# Patient Record
Sex: Male | Born: 1985 | Race: Black or African American | Hispanic: No | Marital: Single | Smoking: Never smoker
Health system: Southern US, Community
[De-identification: ages and names within clinical notes are randomized; demographics above are authoritative.]

## PROBLEM LIST (undated history)

## (undated) DIAGNOSIS — T80A0XA Non-ABO incompatibility reaction due to transfusion of blood or blood products, unspecified, initial encounter: Secondary | ICD-10-CM

## (undated) DIAGNOSIS — D571 Sickle-cell disease without crisis: Secondary | ICD-10-CM

## (undated) DIAGNOSIS — M87051 Idiopathic aseptic necrosis of right femur: Secondary | ICD-10-CM

## (undated) DIAGNOSIS — Z8709 Personal history of other diseases of the respiratory system: Secondary | ICD-10-CM

## (undated) DIAGNOSIS — Z8639 Personal history of other endocrine, nutritional and metabolic disease: Secondary | ICD-10-CM

## (undated) DIAGNOSIS — Z87898 Personal history of other specified conditions: Secondary | ICD-10-CM

## (undated) DIAGNOSIS — G894 Chronic pain syndrome: Secondary | ICD-10-CM

---
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
## 2008-02-13 HISTORY — PX: OTHER SURGICAL HISTORY: SHX169

## 2008-04-09 HISTORY — PX: TOTAL SHOULDER REPLACEMENT: SUR1217

## 2012-03-13 DIAGNOSIS — M549 Dorsalgia, unspecified: Secondary | ICD-10-CM | POA: Diagnosis present

## 2012-05-31 DIAGNOSIS — G8929 Other chronic pain: Secondary | ICD-10-CM | POA: Insufficient documentation

## 2012-05-31 DIAGNOSIS — M545 Low back pain: Secondary | ICD-10-CM | POA: Insufficient documentation

## 2012-06-17 DIAGNOSIS — R894 Abnormal immunological findings in specimens from other organs, systems and tissues: Secondary | ICD-10-CM | POA: Insufficient documentation

## 2012-07-04 HISTORY — PX: PORTACATH PLACEMENT: SHX2246

## 2012-07-08 DIAGNOSIS — F54 Psychological and behavioral factors associated with disorders or diseases classified elsewhere: Secondary | ICD-10-CM | POA: Insufficient documentation

## 2012-08-16 ENCOUNTER — Inpatient Hospital Stay (HOSPITAL_COMMUNITY)
Admission: EM | Admit: 2012-08-16 | Discharge: 2012-08-23 | DRG: 812 | Disposition: A | Discharge status: Home / Self Care | Payer: Medicare Other | Attending: Internal Medicine | Admitting: Internal Medicine

## 2012-08-16 ENCOUNTER — Encounter (HOSPITAL_COMMUNITY): Payer: Self-pay | Admitting: Neurology

## 2012-08-16 ENCOUNTER — Emergency Department (HOSPITAL_COMMUNITY): Payer: Medicare Other

## 2012-08-16 DIAGNOSIS — D5700 Hb-SS disease with crisis, unspecified: Principal | ICD-10-CM | POA: Diagnosis present

## 2012-08-16 DIAGNOSIS — Z79899 Other long term (current) drug therapy: Secondary | ICD-10-CM

## 2012-08-16 HISTORY — DX: Sickle-cell disease without crisis: D57.1

## 2012-08-16 LAB — CBC
Hemoglobin: 8.5 g/dL — ABNORMAL LOW (ref 13.0–17.0)
MCH: 34.4 pg — ABNORMAL HIGH (ref 26.0–34.0)
MCHC: 34 g/dL (ref 30.0–36.0)
MCV: 101.1 fL — ABNORMAL HIGH (ref 78.0–100.0)
MCV: 101.2 fL — ABNORMAL HIGH (ref 78.0–100.0)
Platelets: 350 10*3/uL (ref 150–400)
RBC: 2.74 MIL/uL — ABNORMAL LOW (ref 4.22–5.81)
RBC: 2.47 MIL/uL — ABNORMAL LOW (ref 4.22–5.81)
RDW: 16.8 % — ABNORMAL HIGH (ref 11.5–15.5)
WBC: 9.4 10*3/uL (ref 4.0–10.5)

## 2012-08-16 LAB — TYPE AND SCREEN
ABO/RH(D): AB POS
DAT, IgG: NEGATIVE

## 2012-08-16 LAB — BASIC METABOLIC PANEL
BUN: 12 mg/dL (ref 6–23)
Calcium: 9.5 mg/dL (ref 8.4–10.5)
Chloride: 105 mEq/L (ref 96–112)
Creatinine, Ser: 0.58 mg/dL (ref 0.50–1.35)
GFR calc Af Amer: >90 mL/min (ref >90)

## 2012-08-16 LAB — RETICULOCYTES
Retic Count, Absolute: 227.4 10*3/uL — ABNORMAL HIGH (ref 19.0–186.0)
Retic Ct Pct: 8.3 % — ABNORMAL HIGH (ref 0.4–3.1)

## 2012-08-16 LAB — SALICYLATE LEVEL: Salicylate Lvl: <2 mg/dL — ABNORMAL LOW (ref 2.8–20.0)

## 2012-08-16 LAB — LACTATE DEHYDROGENASE: LDH: 506 U/L — ABNORMAL HIGH (ref 94–250)

## 2012-08-16 MED ORDER — DIPHENHYDRAMINE HCL 50 MG/ML IJ SOLN
25.0000 mg | Freq: Once | INTRAMUSCULAR | Status: AC
  Administered 2012-08-16: 25 mg via INTRAVENOUS
  Filled 2012-08-16: qty 1

## 2012-08-16 MED ORDER — HYDROXYUREA 500 MG PO CAPS
2000.0000 mg | ORAL_CAPSULE | Freq: Every day | ORAL | Status: DC
  Administered 2012-08-16 – 2012-08-17 (×2): 2000 mg via ORAL
  Filled 2012-08-16 (×2): qty 4

## 2012-08-16 MED ORDER — HYDROMORPHONE HCL PF 1 MG/ML IJ SOLN
1.0000 mg | Freq: Once | INTRAMUSCULAR | Status: AC
  Administered 2012-08-16: 1 mg via INTRAVENOUS
  Filled 2012-08-16: qty 1

## 2012-08-16 MED ORDER — FOLIC ACID 1 MG PO TABS
1.0000 mg | ORAL_TABLET | Freq: Every day | ORAL | Status: DC
  Administered 2012-08-16 – 2012-08-23 (×8): 1 mg via ORAL
  Filled 2012-08-16 (×8): qty 1

## 2012-08-16 MED ORDER — HYDROMORPHONE 0.3 MG/ML IV SOLN
INTRAVENOUS | Status: DC
  Administered 2012-08-16: 0.3 mg via INTRAVENOUS
  Filled 2012-08-16: qty 75

## 2012-08-16 MED ORDER — HYDROMORPHONE HCL PF 2 MG/ML IJ SOLN
2.0000 mg | INTRAMUSCULAR | Status: DC | PRN
  Administered 2012-08-16: 2 mg via INTRAVENOUS
  Filled 2012-08-16: qty 1
  Filled 2012-08-16: qty 2

## 2012-08-16 MED ORDER — HYDROMORPHONE HCL PF 1 MG/ML IJ SOLN
2.0000 mg | INTRAMUSCULAR | Status: DC | PRN
  Administered 2012-08-16 (×3): 2 mg via INTRAVENOUS
  Filled 2012-08-16 (×2): qty 2

## 2012-08-16 MED ORDER — ONDANSETRON HCL 4 MG/2ML IJ SOLN: 4.0000 mg | Freq: Four times a day (QID) | INTRAMUSCULAR | Status: DC | PRN

## 2012-08-16 MED ORDER — ONDANSETRON HCL 4 MG PO TABS: 4.0000 mg | ORAL_TABLET | Freq: Four times a day (QID) | ORAL | Status: DC | PRN

## 2012-08-16 MED ORDER — ONDANSETRON HCL 4 MG/2ML IJ SOLN
4.0000 mg | Freq: Once | INTRAMUSCULAR | Status: AC
  Administered 2012-08-16: 4 mg via INTRAVENOUS
  Filled 2012-08-16: qty 2

## 2012-08-16 MED ORDER — DEXTROSE-NACL 5-0.45 % IV SOLN
INTRAVENOUS | Status: DC
  Administered 2012-08-16 – 2012-08-18 (×7): via INTRAVENOUS

## 2012-08-16 MED ORDER — SODIUM CHLORIDE 0.9 % IJ SOLN: 9.0000 mL | INTRAMUSCULAR | Status: DC | PRN

## 2012-08-16 MED ORDER — NALOXONE HCL 0.4 MG/ML IJ SOLN: 0.4000 mg | INTRAMUSCULAR | Status: DC | PRN

## 2012-08-16 MED ORDER — HYDROMORPHONE 0.3 MG/ML IV SOLN
INTRAVENOUS | Status: DC
  Administered 2012-08-17: via INTRAVENOUS
  Administered 2012-08-17: 12.29 mg via INTRAVENOUS
  Administered 2012-08-17: 06:00:00 via INTRAVENOUS
  Filled 2012-08-16 (×2): qty 25

## 2012-08-16 MED ORDER — SODIUM CHLORIDE 0.9 % IJ SOLN
3.0000 mL | Freq: Two times a day (BID) | INTRAMUSCULAR | Status: DC
  Administered 2012-08-17 – 2012-08-23 (×4): 3 mL via INTRAVENOUS

## 2012-08-16 MED ORDER — DIPHENHYDRAMINE HCL 25 MG PO TABS
25.0000 mg | ORAL_TABLET | Freq: Three times a day (TID) | ORAL | Status: DC | PRN
  Administered 2012-08-16: 25 mg via ORAL
  Filled 2012-08-16 (×2): qty 1

## 2012-08-16 MED ORDER — SODIUM CHLORIDE 0.9 % IV BOLUS (SEPSIS)
1000.0000 mL | Freq: Once | INTRAVENOUS | Status: AC
  Administered 2012-08-16: 1000 mL via INTRAVENOUS

## 2012-08-16 MED ORDER — ENOXAPARIN SODIUM 40 MG/0.4ML ~~LOC~~ SOLN
40.0000 mg | SUBCUTANEOUS | Status: DC
  Administered 2012-08-16 – 2012-08-22 (×7): 40 mg via SUBCUTANEOUS
  Filled 2012-08-16 (×8): qty 0.4

## 2012-08-16 MED ORDER — HYDROMORPHONE HCL PF 1 MG/ML IJ SOLN: 2.0000 mg | INTRAMUSCULAR | Status: DC | PRN

## 2012-08-16 NOTE — ED Notes (Signed)
IV team at bedside 

## 2012-08-16 NOTE — ED Provider Notes (Signed)
History     CSN: [id number]  Arrival date & time 08/16/12  4098   First MD Initiated Contact with Patient 08/16/12 [phone]      Chief Complaint  Patient presents with  . Sickle Cell Pain Crisis  . Chest Pain    (Consider location/radiation/quality/duration/timing/severity/associated sxs/prior treatment) Patient is a 26 y.o. male presenting with sickle cell pain and chest pain. The history is provided by the patient.  Sickle Cell Pain Crisis  This is a recurrent problem. The current episode started 2 days ago. The onset was gradual. The problem occurs continuously. The problem has been unchanged. The pain is associated with cold exposure and recent emotional stress. The pain is present in the left side (left leg, lower back, chest). Site of pain is localized in bone. The pain is similar to prior episodes. The pain is moderate. Nothing relieves the symptoms. The symptoms are aggravated by movement. Associated symptoms include chest pain and cough. Pertinent negatives include no abdominal pain, no nausea, no vomiting, no rhinorrhea, no loss of sensation, no tingling, no weakness and no difficulty breathing.  Chest Pain Primary symptoms include cough. Pertinent negatives for primary symptoms include no abdominal pain, no nausea and no vomiting.  Pertinent negatives for associated symptoms include no weakness.     Past Medical History  Diagnosis Date  . Sickle cell disease     Past Surgical History  Procedure Date  . Cholecystectomy     No family history on file.  History  Substance Use Topics  . Smoking status: Never Smoker   . Smokeless tobacco: Not on file  . Alcohol Use: No      Review of Systems  HENT: Negative for rhinorrhea.   Respiratory: Positive for cough.   Cardiovascular: Positive for chest pain.  Gastrointestinal: Negative for nausea, vomiting and abdominal pain.  Neurological: Negative for tingling and weakness.  All other systems reviewed and are  negative.    Allergies  Review of patient's allergies indicates not on file.  Home Medications  No current outpatient prescriptions on file.  BP 125/74  Pulse 115  Temp 98.2 F (36.8 C) (Oral)  Resp 20  SpO2 100%  Physical Exam  Nursing note and vitals reviewed. Constitutional: He is oriented to person, place, and time. He appears well-developed and well-nourished. No distress.  HENT:  Head: Normocephalic and atraumatic.  Mouth/Throat: No oropharyngeal exudate.  Eyes: EOM are normal. Pupils are equal, round, and reactive to light.  Neck: Normal range of motion. Neck supple.  Cardiovascular: Normal rate and regular rhythm.  Exam reveals no friction rub.   No murmur heard. Pulmonary/Chest: Effort normal. No respiratory distress. He has wheezes (RUL wheezes). He has no rales.  Abdominal: He exhibits no distension. There is no tenderness. There is no rebound.  Musculoskeletal: Normal range of motion. He exhibits no edema.  Neurological: He is alert and oriented to person, place, and time.  Skin: He is not diaphoretic.    ED Course  Procedures (including critical care time)  Labs Reviewed  CBC - Abnormal; Notable for the following:    RBC 2.74 (*)     Hemoglobin 9.6 (*)     HCT 27.7 (*)     MCV 101.1 (*)     MCH 35.0 (*)     RDW 16.8 (*)     All other components within normal limits  RETICULOCYTES - Abnormal; Notable for the following:    Retic Ct Pct 8.3 (*)     RBC.  2.74 (*)     Retic Count, Manual 227.4 (*)     All other components within normal limits  LACTATE DEHYDROGENASE - Abnormal; Notable for the following:    LDH 506 (*)  HEMOLYSIS AT THIS LEVEL MAY AFFECT RESULT   All other components within normal limits  BASIC METABOLIC PANEL - Abnormal; Notable for the following:    CO2 18 (*)     Glucose, Bld 102 (*)     All other components within normal limits  SALICYLATE LEVEL - Abnormal; Notable for the following:    Salicylate Lvl <2.0 (*)     All other  components within normal limits  TYPE AND SCREEN  LACTIC ACID, PLASMA   No results found.  Diagnosis: 1. Sickle Cell Pain Crisis   Date: 08/16/2012  Rate: 105  Rhythm: sinus tachycardia  QRS Axis: normal  Intervals: normal  ST/T Wave abnormalities: flipped T waves inferiorly  Conduction Disutrbances:none  Narrative Interpretation:   Old EKG Reviewed: none available    MDM   26 year old male with history of sickle cell disease presents with left leg, lower back, chest pain. Normal pancreas he sees her in the left leg and lower back. States he has a misty medications. Has been under emotional stress and exposed to cold weather, both of which are triggers for pain crises for him. States normal hemoglobin around 7. Normotensive. Mildly tachycardic. No tachypnea or hypoxia. 100% oxygen sats in room air. Concern for pain crisis. We'll also check chest x-ray to rule out acute chest syndrome. Will give IV narcotics, Benadryl, Zofran. Patient still in pain after 3 doses of IV dilaudid. Will admit for sickle cell pain crisis.       Elwin Mocha, MD 08/16/12 [phone]

## 2012-08-16 NOTE — ED Notes (Signed)
Phlebotomy at bedside. Attempt to call reporting, floor unable to take at this time.

## 2012-08-16 NOTE — ED Notes (Signed)
O2 applied at 2lpm. Pt c/o pain in legs and back. Rates pain at 7/10

## 2012-08-16 NOTE — H&P (Signed)
Hospital Admission Note Date: 08/16/2012  Patient name: Dennis Bernard Medical record number: [id number] Date of birth: 08/29/1986 Age: 26 y.o. Gender: male PCP: DEFAULT,PROVIDER, MD  Medical Service: Internal medicine teaching service  Attending physician: Dr. Rogelia Boga    1st Contact: Kazibwe    Pager: [phone] 2nd Contact: [name]                Pager: [phone] After 5 pm or weekends: 1st Contact:      Pager: [phone] 2nd Contact:      Pager: [phone]  Chief Complaint: leg pain, chest pain, back pain   History of Present Illness: patient is a pleasant 26 year old man with history of sickle cell disease who comes to the ED with complaints of left leg, low back and chest pain from sickle cell crisis. He reports started having pain in his left thigh and low-back 2 days before- which gradually got worse. These are his typical sickle pain crisis symptoms, nothing unusual. He felt dehydrated as was not drinking enough water and also says cold weather might have contributed to bring on this crisis. He started having chest pain sometime around yesterday - substernal , nonradiating , pressure-like, associated with shortness of breath. There have this kind of chest pain before. Does not have history of MI. Not associated with palpitation or dizziness, vomiting, diaphoresis. It is associated with dry cough. No productive sputum.  Denies any fever but has chills. Feels a little nauseous. Denies any abdominal pain, diarrhea, focal weakness, numbness, headache, vision changes.   He does not have frequent sickle cell crisis. His last blood transfusion was about 2 years before. He has a Port-A-Cath in place. I   Meds: Current Outpatient Rx  Name  Route  Sig  Dispense  Refill  . FOLIC ACID 1 MG PO TABS   Oral   Take 1 mg by mouth daily.         Marland Kitchen HYDROMORPHONE HCL 8 MG PO TABS   Oral   Take 8 mg by mouth every 8 (eight) hours as needed. For pain         . HYDROXYUREA 500 MG PO CAPS    Oral   Take 2,000 mg by mouth daily. May take with food to minimize GI side effects.           Allergies: Allergies as of 08/16/2012  . (No Known Allergies)   Past Medical History  Diagnosis Date  . Sickle cell disease    Past Surgical History  Procedure Date  . Cholecystectomy    Family History  Problem Relation Age of Onset  . Sickle cell anemia Brother   . Sickle cell trait Father   . Sickle cell trait Mother   . Sickle cell anemia Paternal Uncle    History   Social History  . Marital Status: Single    Spouse Name: N/A    Number of Children: N/A  . Years of Education: N/A   Occupational History  . Not on file.   Social History Main Topics  . Smoking status: Never Smoker   . Smokeless tobacco: Not on file  . Alcohol Use: No  . Drug Use: No  . Sexually Active:    Other Topics Concern  . 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    Review of Systems: As per HPI. 12 points review of systems done  and negative.  Physical Exam: Blood pressure 107/64, pulse 82, temperature 98.2 F (36.8 C), temperature source Oral, resp. rate 16, SpO2 100.00%. Constitutional: Vital signs reviewed.  Patient is a well-developed and well-nourished in mild acute distress due to pain and cooperative with exam. Alert and oriented x3.  Head: Normocephalic and atraumatic Mouth: no erythema or exudates, MMM Eyes: PERRL, EOMI, conjunctivae normal, No scleral icterus.  Neck: Supple, Trachea midline normal ROM, No JVD Cardiovascular: RRR, S1 normal, S2 normal, no MRG, pulses symmetric and intact bilaterally Pulmonary/Chest: CTAB, no wheezes, rales, or rhonchi Abdominal: Soft. Non-tender, non-distended, bowel sounds are normal, no masses, organomegaly, or guarding present.  Musculoskeletal: No joint deformities, erythema, or stiffness, ROM full and no  nontender. L thigh tenderness. Hematology: no cervical, inginal, or axillary adenopathy.  Neurological: A&O x3, Strength is normal and symmetric bilaterally, cranial nerve II-XII are grossly intact, no focal motor deficit, sensory intact to light touch bilaterally.  Skin: Warm, dry and intact. No rash, cyanosis, or clubbing.  Psychiatric: Normal mood and affect. speech and behavior is normal. Judgment and thought content normal. Cognition and memory are normal.     Lab results: Basic Metabolic Panel:  [facility] 08/16/12 0910  NA 140  K 4.0  CL 105  CO2 18*  GLUCOSE 102*  BUN 12  CREATININE 0.58  CALCIUM 9.5  MG --  PHOS --    CBC:  Basename 08/16/12 0910  WBC 9.4  NEUTROABS --  HGB 9.6*  HCT 27.7*  MCV 101.1*  PLT 350   Anemia Panel:  Basename 08/16/12 0910  VITAMINB12 --  FOLATE --  FERRITIN --  TIBC --  IRON --  RETICCTPCT 8.3*    Imaging results:  Dg Chest 2 View  08/16/2012  *RADIOLOGY REPORT*  Clinical Data: Chest pain, sickle cell crisis  CHEST - 2 VIEW  Comparison: None.  Findings: Cardiomediastinal silhouette is unremarkable.  There is left IJ central line with tip in SVC right atrium junction.  No acute infiltrate or pulmonary edema.  Bony thorax is unremarkable. No diagnostic pneumothorax.  IMPRESSION: No acute infiltrate or pulmonary edema.  Left IJ central line with tip in SVC right atrium junction.  No diagnostic pneumothorax.   Original Report Authenticated By: Natasha Mead, M.D.     Other results: EKG: sinus tachycardia.  Nonspecific T-wave inversions in 2, 3 and aVF.  Assessment & Plan by Problem: Active Problems:  Sickle cell pain crisis 1) Acute sickle cell pain crisis: Patient with acute crisis of his sickle cell disease. Leg, back and chest pain  secondary to crisis. Chest x-ray negative for infiltrates- so does not have acute chest syndrome. Also patient does not have productive cough or fever.  Patient is hemo-concentrated. Reports his  baseline hemoglobin is around 7. It's 9.6 today. LDH elevated- 506.  Plan: Admit patient to telemetry bed for observation. - Pain management with IV Dilaudid- 2 mg every 2 hours as needed. - Hydration with D5 half-normal saline- 125 cc per hour. - Recheck CBC at 5 PM. - Anti-emetics for symptom control. - No indication for transfusion at this point of time. - Also will type and cross. - We'll not work up for acute coronary syndrome for now. We'll consider getting repeat EKG and cardiac enzymes if pain does not improve or gets worse.  2) Elevated anion gap: Anion gap of 17. Bicarbonate 18.  - Check lactic acid.  3) DVT prophylaxis: Heparin  Signed: Kylyn Sookram 08/16/2012, 12:16 PM

## 2012-08-16 NOTE — ED Notes (Signed)
Paged IV team to access port a cath.  

## 2012-08-16 NOTE — ED Provider Notes (Signed)
I saw and evaluated the patient, reviewed the resident's note and I agree with the findings and plan.  Derwood Kaplan, MD 08/16/12 [phone]

## 2012-08-16 NOTE — ED Notes (Signed)
Pt brought back to room; pt undressed, in gown, on monitor, continuous pulse oximetry and blood pressure cuff; EKG performed; warm blanket given

## 2012-08-16 NOTE — ED Notes (Signed)
Pt reporting sickle cell crisis, pain in lower back and left lower leg x 2 days. Also c/p. Pt reporting cough, non-productive. CP "achy" central. Pt a x 4. Ambulatory.

## 2012-08-17 DIAGNOSIS — D571 Sickle-cell disease without crisis: Secondary | ICD-10-CM

## 2012-08-17 LAB — CBC WITH DIFFERENTIAL/PLATELET
Eosinophils Absolute: 0.1 10*3/uL (ref 0.0–0.7)
Eosinophils Relative: 1 % (ref 0–5)
Lymphs Abs: 6.1 10*3/uL — ABNORMAL HIGH (ref 0.7–4.0)
MCH: 35 pg — ABNORMAL HIGH (ref 26.0–34.0)
MCHC: 34.7 g/dL (ref 30.0–36.0)
MCV: 100.8 fL — ABNORMAL HIGH (ref 78.0–100.0)
Monocytes Absolute: 0.6 10*3/uL (ref 0.1–1.0)
Monocytes Relative: 7 % (ref 3–12)
Neutrophils Relative %: 19 % — ABNORMAL LOW (ref 43–77)
Platelets: 289 10*3/uL (ref 150–400)
RBC: 2.4 MIL/uL — ABNORMAL LOW (ref 4.22–5.81)
RDW: 15.8 % — ABNORMAL HIGH (ref 11.5–15.5)

## 2012-08-17 LAB — COMPREHENSIVE METABOLIC PANEL
ALT: 15 U/L (ref 0–53)
AST: 18 U/L (ref 0–37)
Albumin: 3.5 g/dL (ref 3.5–5.2)
Alkaline Phosphatase: 90 U/L (ref 39–117)
BUN: 10 mg/dL (ref 6–23)
Chloride: 104 mEq/L (ref 96–112)
GFR calc non Af Amer: >90 mL/min (ref >90)
Glucose, Bld: 103 mg/dL — ABNORMAL HIGH (ref 70–99)
Potassium: 3.4 mEq/L — ABNORMAL LOW (ref 3.5–5.1)
Sodium: 135 mEq/L (ref 135–145)
Total Bilirubin: 2.1 mg/dL — ABNORMAL HIGH (ref 0.3–1.2)

## 2012-08-17 MED ORDER — NALOXONE HCL 0.4 MG/ML IJ SOLN: 0.4000 mg | INTRAMUSCULAR | Status: DC | PRN

## 2012-08-17 MED ORDER — SODIUM CHLORIDE 0.9 % IV SOLN
1.5000 mg/h | INTRAVENOUS | Status: DC
  Administered 2012-08-17: 1.5 mg/h via INTRAVENOUS
  Filled 2012-08-17: qty 5

## 2012-08-17 MED ORDER — DIPHENHYDRAMINE HCL 25 MG PO TABS
50.0000 mg | ORAL_TABLET | Freq: Three times a day (TID) | ORAL | Status: DC | PRN
  Administered 2012-08-17 – 2012-08-18 (×3): 50 mg via ORAL
  Filled 2012-08-17: qty 1
  Filled 2012-08-17 (×3): qty 2
  Filled 2012-08-17: qty 1

## 2012-08-17 MED ORDER — SODIUM CHLORIDE 0.9 % IJ SOLN: 9.0000 mL | INTRAMUSCULAR | Status: DC | PRN

## 2012-08-17 MED ORDER — HYDROMORPHONE 0.3 MG/ML IV SOLN
INTRAVENOUS | Status: DC
  Administered 2012-08-17: 23:00:00 via INTRAVENOUS

## 2012-08-17 MED ORDER — HYDROMORPHONE 0.3 MG/ML IV SOLN
INTRAVENOUS | Status: DC
Start: 2012-08-17 — End: 2012-08-17
  Administered 2012-08-17: 0.6 mg via INTRAVENOUS
  Filled 2012-08-17: qty 25

## 2012-08-17 MED ORDER — HYDROMORPHONE 0.3 MG/ML IV SOLN
INTRAVENOUS | Status: DC
  Administered 2012-08-17 (×2): 0.6 mg via INTRAVENOUS
  Administered 2012-08-17: 20:00:00 via INTRAVENOUS
  Filled 2012-08-17 (×4): qty 25

## 2012-08-17 MED ORDER — HYDROMORPHONE 0.3 MG/ML IV SOLN: INTRAVENOUS | Status: DC

## 2012-08-17 MED ORDER — POTASSIUM CHLORIDE CRYS ER 20 MEQ PO TBCR
40.0000 meq | EXTENDED_RELEASE_TABLET | Freq: Once | ORAL | Status: AC
  Administered 2012-08-17: 40 meq via ORAL
  Filled 2012-08-17: qty 1
  Filled 2012-08-17: qty 2
  Filled 2012-08-17: qty 1

## 2012-08-17 MED ORDER — DEXTROSE 5 % IV SOLN: 20.0000 mg/h | INTRAVENOUS | Status: DC

## 2012-08-17 NOTE — Progress Notes (Signed)
Subjective: The patient has continued to have pains with little relief. The doses for PCA were increased at night but he still complained of pain and his medication had to be increased twice today. He otherwise has no new complaints. He again mentions that he would desire to be seen at a clinic in [place].  Objective: Vital signs in last 24 hours: Filed Vitals:   08/17/12 1125 08/17/12 1202 08/17/12 1319 08/17/12 1434  BP:  91/76 104/48 102/58  Pulse:  68 77 76  Temp:  98.7 F (37.1 C)    TempSrc:  Oral    Resp: 20 19    Height:      Weight:      SpO2: 99% 100% 99% 100%   Weight change:   Intake/Output Summary (Last 24 hours) at 08/17/12 1449 Last data filed at 08/17/12 1251  Gross per 24 hour  Intake 2479.25 ml  Output   1400 ml  Net 1079.25 ml   Physical exam: onstitutional: Vital signs reviewed. Patient is a well-developed and well-nourished in mild acute distress due to pain and cooperative with exam. Alert and oriented x3.  Head: Normocephalic and atraumatic  Mouth: no erythema or exudates, MMM  Eyes: PERRL, EOMI, conjunctivae normal, No scleral icterus.  Neck: Supple, Trachea midline normal ROM, No JVD  Cardiovascular: RRR, S1 normal, S2 normal, no MRG, pulses symmetric and intact bilaterally  Pulmonary/Chest: CTAB, no wheezes, rales, or rhonchi  Abdominal: Soft. Non-tender, non-distended, bowel sounds are normal, no masses, organomegaly, or guarding present.  Musculoskeletal: No joint deformities, erythema, or stiffness, ROM full and no nontender. L thigh tenderness. Hematology: no cervical, inginal, or axillary adenopathy.  Neurological: A&O x3, Strength is normal and symmetric bilaterally, cranial nerve II-XII are grossly intact, no focal motor deficit, sensory intact to light touch bilaterally.  Skin: Warm, dry and intact. No rash, cyanosis, or clubbing.  Psychiatric: Normal mood and affect. speech and behavior is normal. Judgment and thought content normal.  Cognition and memory are normal.  Lab Results: Basic Metabolic Panel:  Lab 08/17/12 4098 08/16/12 0910  NA 135 140  K 3.4* 4.0  CL 104 105  CO2 23 18*  GLUCOSE 103* 102*  BUN 10 12  CREATININE 0.60 0.58  CALCIUM 8.6 9.5  MG -- --  PHOS -- --   Liver Function Tests:  Lab 08/17/12 0700  AST 18  ALT 15  ALKPHOS 90  BILITOT 2.1*  PROT 6.2  ALBUMIN 3.5   CBC  Lab 08/17/12 0700 08/16/12 2031  WBC 8.4 8.8  NEUTROABS 1.6* --  HGB 8.4* 8.5*  HCT 24.2* 25.0*  MCV 100.8* 101.2*  PLT 289 327    Lab 08/16/12 0910  VITAMINB12 --  FOLATE --  FERRITIN --  TIBC --  IRON --  RETICCTPCT 8.3*   Studies/Results: Dg Chest 2 View  08/16/2012  *RADIOLOGY REPORT*  Clinical Data: Chest pain, sickle cell crisis  CHEST - 2 VIEW  Comparison: None.  Findings: Cardiomediastinal silhouette is unremarkable.  There is left IJ central line with tip in SVC right atrium junction.  No acute infiltrate or pulmonary edema.  Bony thorax is unremarkable. No diagnostic pneumothorax.  IMPRESSION: No acute infiltrate or pulmonary edema.  Left IJ central line with tip in SVC right atrium junction.  No diagnostic pneumothorax.   Original Report Authenticated By: Natasha Mead, M.D.    Medications:  Scheduled Meds:   . enoxaparin (LOVENOX) injection  40 mg Subcutaneous Q24H  . folic acid  1 mg  Oral Daily  . HYDROmorphone PCA 0.3 mg/mL   Intravenous Q4H  . hydroxyurea  2,000 mg Oral Daily  . [COMPLETED] potassium chloride  40 mEq Oral Once  . sodium chloride  3 mL Intravenous Q12H  . [DISCONTINUED] HYDROmorphone PCA 0.3 mg/mL   Intravenous Q4H  . [DISCONTINUED] HYDROmorphone PCA 0.3 mg/mL   Intravenous Q4H  . [DISCONTINUED] HYDROmorphone PCA 0.3 mg/mL   Intravenous Q4H   Continuous Infusions:   . dextrose 5 % and 0.45% NaCl 150 mL/hr at 08/17/12 1200  . [DISCONTINUED] morphine     PRN Meds:.diphenhydrAMINE, naloxone, ondansetron (ZOFRAN) IV, ondansetron, sodium chloride, [DISCONTINUED] diphenhydrAMINE,  [DISCONTINUED]  HYDROmorphone (DILAUDID) injection, [DISCONTINUED] naloxone, [DISCONTINUED] sodium chloride   Assessment/Plan: A pleasant 26 year old AA man with history of sickle cell disease who comes to the ED with complaints of left leg, low back and chest pain from sickle cell crisis for 2 days.   1) Acute sickle cell pain crisis: Patient with acute crisis of his sickle cell disease. Leg, back and chest pain secondary to crisis. Chest x-ray negative for infiltrates- so does not have acute chest syndrome. Also patient does not have productive cough or fever. Possible pain triggered by cold weather and dehydration as per patient. His pain has not been well controlled on dilaudid PCA and the dose had to be gradually increased for pain control.   Plan  - currently on Basal morphine of 1.5mg /hr and PCA dilaudid. Initially did not have basal morphine. There is room to continue evaluating his pain and increased his pain medication if needed. I have encouraged the patient to alert the nurse if pain is not well controlled. - Hb has reduced from 9.7 on admission to 8.4 today. Baseline is around 7 as per patient.  - Check Hb once daily since he does not have an acute blood loss and still above baseline. We will consider transfusion only necessary - We will continue with hydration with D5 half-normal saline at 150 cc per hour. His GAP has closed.   - Continue with Benadryl for itching -Once his pain resolves we can come up with an outpatient regimen. He takes dilaudid 8 mg tid as need for pain. He had not needed for the previous 2 months. - F/u with [facility] cell clinic set up on January 7 with Dr Willey Blade.  2) DVT prophylaxis: Heparin  Dispo: Disposition is deferred at this time, awaiting improvement of current medical problems.  Anticipated discharge in approximately 1-2 day(s).   The patient does not have a current PCP (DEFAULT,PROVIDER, MD), therefore will be requiring [facility] follow-up after  discharge.   The patient does not have transportation limitations that hinder transportation to clinic appointments.  .Services Needed at time of discharge: Y = Yes, Blank = No PT:   OT:   RN:   Equipment:   Other:     LOS: 1 day   Dow Adolph 08/17/2012, 2:49 PM

## 2012-08-17 NOTE — H&P (Signed)
Internal Medicine Teaching Service Attending Note Date: 08/17/2012  Patient name: Kellar Westberg  Medical record number: [id number]  Date of birth: [birth year]   I have seen and evaluated Cyndia Skeeters and discussed their care with the Residency Team. Please see Dr Eliane Decree H&P for full details. 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 He has Sickle Cell Anemia and sees the sickle cell clinic at [facility]. He gets about one crisis per month but is able to treat all but 3 or 4 per year at home with PO Dilaudid. He has great insight and knowledge into his condition. He has L thigh pain, lower back pain, and chest pain for about 2 days. HE failed PO pain control. He states stress, weather changes, and dehydration can trigger a crisis. He is on hydroxyurea.   PMHx, meds, allergies, soc hx, fam hx, and ROS were reviewed.  On exam, he is resting in bed but appears uncomfortable. Vitals are stable. He is alert, mentating. HRRR LCTAB ABD benign Neuro no focal  Labs and imaging were reviewed  Assessment and Plan: I agree with the formulated Assessment and Plan with the following changes:   1. Sickle cell crisis - he does not meet the criteria for acute chest syndrome. There is no source of infxn that could have triggered the crisis. Will treat with IVF, O2, opioids. Freq assessment of pain control with escalation of opioids as needed to control pain as long as cardio-resp status stable.    Burns Spain, MD 12/4/20134:13 PM

## 2012-08-17 NOTE — Progress Notes (Signed)
Utilization Review Completed.   Zacharias Ridling, RN, BSN Nurse Case Manager  [phone]  

## 2012-08-18 LAB — BASIC METABOLIC PANEL
BUN: 5 mg/dL — ABNORMAL LOW (ref 6–23)
CO2: 24 mEq/L (ref 19–32)
Calcium: 8.7 mg/dL (ref 8.4–10.5)
Creatinine, Ser: 0.57 mg/dL (ref 0.50–1.35)
GFR calc non Af Amer: >90 mL/min (ref >90)
Glucose, Bld: 83 mg/dL (ref 70–99)
Sodium: 137 mEq/L (ref 135–145)

## 2012-08-18 LAB — CBC
MCH: 34.5 pg — ABNORMAL HIGH (ref 26.0–34.0)
MCHC: 35.2 g/dL (ref 30.0–36.0)
MCV: 97.9 fL (ref 78.0–100.0)
Platelets: UNDETERMINED 10*3/uL (ref 150–400)
RBC: 2.35 MIL/uL — ABNORMAL LOW (ref 4.22–5.81)
RDW: 15.9 % — ABNORMAL HIGH (ref 11.5–15.5)
WBC: 8.1 10*3/uL (ref 4.0–10.5)

## 2012-08-18 MED ORDER — HYDROMORPHONE 0.3 MG/ML IV SOLN
INTRAVENOUS | Status: DC
  Filled 2012-08-18: qty 25

## 2012-08-18 MED ORDER — SODIUM CHLORIDE 0.9 % IV SOLN
INTRAVENOUS | Status: DC
  Administered 2012-08-18 – 2012-08-22 (×6): via INTRAVENOUS

## 2012-08-18 MED ORDER — SODIUM CHLORIDE 0.9 % IV SOLN
1.5000 mg/h | INTRAVENOUS | Status: DC
  Administered 2012-08-18 – 2012-08-19 (×2): 1.5 mg/h via INTRAVENOUS
  Filled 2012-08-18 (×2): qty 5

## 2012-08-18 MED ORDER — HYDROMORPHONE 0.3 MG/ML IV SOLN
INTRAVENOUS | Status: DC
Start: 2012-08-18 — End: 2012-08-18
  Administered 2012-08-18 (×3): via INTRAVENOUS
  Administered 2012-08-18: 0.379 mg via INTRAVENOUS
  Filled 2012-08-18 (×3): qty 25

## 2012-08-18 MED ORDER — HYDROMORPHONE 0.3 MG/ML IV SOLN
INTRAVENOUS | Status: DC
  Administered 2012-08-18: 0.6 mg via INTRAVENOUS
  Administered 2012-08-18 (×3): via INTRAVENOUS
  Administered 2012-08-18: 0.6 mg via INTRAVENOUS
  Filled 2012-08-18 (×4): qty 25

## 2012-08-18 MED ORDER — SODIUM CHLORIDE 0.9 % IV SOLN
2.0000 mg/h | INTRAVENOUS | Status: DC
  Filled 2012-08-18: qty 20

## 2012-08-18 MED ORDER — HYDROMORPHONE 0.3 MG/ML IV SOLN: INTRAVENOUS | Status: DC

## 2012-08-18 NOTE — Progress Notes (Signed)
Wasted with another EA:[id number] ,Diluadid IV bag 0.5 mg /mL with NS in 100 mL bag, pt had a total of 3 mg from the bag, Dennis Bernard............Marland Kitchen

## 2012-08-18 NOTE — Progress Notes (Signed)
Subjective: The patient reports good pain relief with her current PCA settings of basal Dilaudid of 1.5 mg per hour and 0.6 mg every 8 minutes with a button push. He describes his pain as having reduced from a 9 to a 7/10 with these settings. He is happy with this improvement. And does not one to increase the settings. However, over the night, his PCA was recurrently interrupted because the available syringe cannot hold enough of the medication. The syringe on the hold up to 7.5 mg of the Dilaudid, which he runs out of every 2 hours. This resulted into several long intervals of poor pain control.  Objective: Vital signs in last 24 hours: Filed Vitals:   08/18/12 1500 08/18/12 1545 08/18/12 1606 08/18/12 1618  BP: 107/56  105/52   Pulse: 89     Temp:  98.3 F (36.8 C)    TempSrc:  Oral    Resp: 20   20  Height:      Weight:      SpO2: 100% 100%  98%   Weight change: 7 lb 3.1 oz (3.262 kg)  Intake/Output Summary (Last 24 hours) at 08/18/12 1858 Last data filed at 08/18/12 1400  Gross per 24 hour  Intake    103 ml  Output   2825 ml  Net  -2722 ml   Physical exam: onstitutional: Vital signs reviewed. Patient is a well-developed and well-nourished in mild acute distress due to pain and cooperative with exam. Alert and oriented x3.  Head: Normocephalic and atraumatic  Mouth: no erythema or exudates, MMM  Eyes: PERRL, EOMI, conjunctivae normal, No scleral icterus.  Neck: Supple, Trachea midline normal ROM, No JVD  Cardiovascular: RRR, S1 normal, S2 normal, no MRG, pulses symmetric and intact bilaterally  Pulmonary/Chest: CTAB, no wheezes, rales, or rhonchi  Abdominal: Soft. Non-tender, non-distended, bowel sounds are normal, no masses, organomegaly, or guarding present.  Musculoskeletal: No joint deformities, erythema, or stiffness, ROM full and no nontender. L thigh tenderness. Hematology: no cervical, inginal, or axillary adenopathy.  Neurological: A&O x3, Strength is normal and  symmetric bilaterally, cranial nerve II-XII are grossly intact, no focal motor deficit, sensory intact to light touch bilaterally.  Skin: Warm, dry and intact. No rash, cyanosis, or clubbing.  Psychiatric: Normal mood and affect. speech and behavior is normal. Judgment and thought content normal. Cognition and memory are normal.  Lab Results: Basic Metabolic Panel:  Lab 08/18/12 1610 08/17/12 0700  NA 137 135  K 4.1 3.4*  CL 103 104  CO2 24 23  GLUCOSE 83 103*  BUN 5* 10  CREATININE 0.57 0.60  CALCIUM 8.7 8.6  MG -- --  PHOS -- --   Liver Function Tests:  Lab 08/17/12 0700  AST 18  ALT 15  ALKPHOS 90  BILITOT 2.1*  PROT 6.2  ALBUMIN 3.5   CBC  Lab 08/18/12 1120 08/17/12 0700  WBC 8.1 8.4  NEUTROABS -- 1.6*  HGB 8.1* 8.4*  HCT 23.0* 24.2*  MCV 97.9 100.8*  PLT PLATELET CLUMPS NOTED ON SMEAR, UNABLE TO ESTIMATE 289    Lab 08/16/12 0910  VITAMINB12 --  FOLATE --  FERRITIN --  TIBC --  IRON --  RETICCTPCT 8.3*   Studies/Results: No results found. Medications:  Scheduled Meds:    . enoxaparin (LOVENOX) injection  40 mg Subcutaneous Q24H  . folic acid  1 mg Oral Daily  . HYDROmorphone PCA 0.3 mg/mL   Intravenous Q4H  . sodium chloride  3 mL Intravenous Q12H  . [  DISCONTINUED] HYDROmorphone PCA 0.3 mg/mL   Intravenous Q4H  . [DISCONTINUED] HYDROmorphone PCA 0.3 mg/mL   Intravenous Q4H  . [DISCONTINUED] HYDROmorphone PCA 0.3 mg/mL   Intravenous Q4H  . [DISCONTINUED] HYDROmorphone PCA 0.3 mg/mL   Intravenous Q4H  . [DISCONTINUED] HYDROmorphone PCA 0.3 mg/mL   Intravenous Q4H   Continuous Infusions:    . sodium chloride    . [DISCONTINUED] dextrose 5 % and 0.45% NaCl 150 mL/hr at 08/18/12 1116  . [DISCONTINUED] HYDROmorphone 1.5 mg/hr (08/17/12 2340)  . [DISCONTINUED] HYDROmorphone     PRN Meds:.diphenhydrAMINE, naloxone, ondansetron (ZOFRAN) IV, ondansetron, sodium chloride   Assessment/Plan: A pleasant 26 year old AA man with history of sickle cell  disease who comes to the ED with complaints of left leg, low back and chest pain from sickle cell crisis for 2 days.   1) Acute sickle cell pain crisis: Patient with acute crisis of his sickle cell disease. Leg, back and chest pain secondary to crisis. Chest x-ray negative for infiltrates- so does not have acute chest syndrome. Also patient does not have productive cough or fever. Possible pain triggered by cold weather and dehydration as per patient. His pain has not been well controlled on dilaudid PCA and the dose had to be gradually increased for pain control.   Plan  - Continue with the current settings PCA at a basal 1.5 mg every hour and 0.6 mg on button push. The patient will be transferred to step down where his medications by PCA can be more closely administered. If needed, basal, and aortic can be increased by 0.5 mg every hour and the intermittent Dilaudid can be increased by 0.2 mg per hour in a stepwise manner. - Hb has reduced from 9.7 on admission to 8.1 today. Baseline is around 7 as per patient.  - Check Hb once daily since he does not have an acute blood loss and still above baseline. We will consider transfusion only necessary - We will continue normal saline at 150 cc per hour.    - Continue with Benadryl for itching -Once his pain resolves we can come up with an outpatient regimen. He takes dilaudid 8 mg tid as need for pain. He had not needed for the previous 2 months. - F/u with [facility] Sickle cell clinic set up on January 7 with Dr Willey Blade.  2) DVT prophylaxis: Heparin  Dispo: Disposition is deferred at this time, awaiting improvement of current medical problems.  Anticipated discharge in approximately 1-2 day(s).   The patient does not have a current PCP (DEFAULT,PROVIDER, MD), therefore will be requiring OPC follow-up after discharge.   The patient does not have transportation limitations that hinder transportation to clinic appointments.  .Services Needed at time of  discharge: Y = Yes, Blank = No PT:   OT:   RN:   Equipment:   Other:     LOS: 2 days   Dow Adolph 08/18/2012, 6:58 PM

## 2012-08-18 NOTE — Progress Notes (Signed)
Internal Medicine Teaching Service Attending Note Date: 08/18/2012  Patient name: Dennis Bernard  Medical record number: [id number]  Date of birth: February 29, 1986    This patient has been seen and discussed with the house staff. Please see their note for complete details. I concur with their findings with the following additions/corrections: Dennis Bernard was seen on AM rounds. His pain is better controlled except for the times when the syringe runs out of hydrocodone. We will look into transfer to step down which will have better ability for freq PCA refills.  Dennis Bernard 08/18/2012, 12:34 PM

## 2012-08-18 NOTE — Progress Notes (Signed)
3cc of Dilaudid wasted in the sink. Eliane Decree, RN witnessed waste. PCA reverified and started

## 2012-08-19 DIAGNOSIS — Z0000 Encounter for general adult medical examination without abnormal findings: Secondary | ICD-10-CM

## 2012-08-19 LAB — BASIC METABOLIC PANEL
BUN: 6 mg/dL (ref 6–23)
Calcium: 9.1 mg/dL (ref 8.4–10.5)
Chloride: 106 mEq/L (ref 96–112)
Creatinine, Ser: 0.53 mg/dL (ref 0.50–1.35)
GFR calc Af Amer: >90 mL/min (ref >90)
GFR calc non Af Amer: >90 mL/min (ref >90)
Potassium: 3.8 mEq/L (ref 3.5–5.1)
Sodium: 141 mEq/L (ref 135–145)

## 2012-08-19 LAB — CBC
HCT: 23.2 % — ABNORMAL LOW (ref 39.0–52.0)
MCHC: 34.5 g/dL (ref 30.0–36.0)
MCV: 97.9 fL (ref 78.0–100.0)
Platelets: 242 10*3/uL (ref 150–400)
RBC: 2.37 MIL/uL — ABNORMAL LOW (ref 4.22–5.81)
RDW: 15.9 % — ABNORMAL HIGH (ref 11.5–15.5)

## 2012-08-19 MED ORDER — HYDROMORPHONE 0.3 MG/ML IV SOLN
INTRAVENOUS | Status: DC
  Administered 2012-08-19 (×2): via INTRAVENOUS
  Administered 2012-08-19: 9.87 mg via INTRAVENOUS
  Administered 2012-08-19: 10.4 mg via INTRAVENOUS
  Filled 2012-08-19 (×3): qty 25

## 2012-08-19 MED ORDER — HYDROMORPHONE 0.3 MG/ML IV SOLN
INTRAVENOUS | Status: DC
  Administered 2012-08-20 (×2): 7.5 mg via INTRAVENOUS
  Administered 2012-08-20 (×2): via INTRAVENOUS
  Administered 2012-08-20 (×2): 7.5 mg via INTRAVENOUS
  Administered 2012-08-20: 20:00:00 via INTRAVENOUS
  Administered 2012-08-21: 3.8 mg via INTRAVENOUS
  Administered 2012-08-21 (×3): via INTRAVENOUS
  Administered 2012-08-21: 5.68 mg via INTRAVENOUS
  Administered 2012-08-21: 22:00:00 via INTRAVENOUS
  Administered 2012-08-21: 11.3 mg via INTRAVENOUS
  Administered 2012-08-21 – 2012-08-22 (×6): via INTRAVENOUS
  Filled 2012-08-19 (×20): qty 25

## 2012-08-19 MED ORDER — HYDROMORPHONE 0.3 MG/ML IV SOLN: INTRAVENOUS | Status: DC

## 2012-08-19 MED ORDER — HYDROMORPHONE 0.3 MG/ML IV SOLN
INTRAVENOUS | Status: DC
  Administered 2012-08-19: 6.16 mg via INTRAVENOUS
  Filled 2012-08-19: qty 25

## 2012-08-19 MED ORDER — HYDROMORPHONE HCL 1 MG/ML IJ SOLN
1.5000 mg | INTRAMUSCULAR | Status: DC | PRN
  Administered 2012-08-19: 1.5 mg via INTRAVENOUS
  Filled 2012-08-19 (×3): qty 2

## 2012-08-19 MED ORDER — HYDROMORPHONE 0.3 MG/ML IV SOLN
INTRAVENOUS | Status: DC
  Administered 2012-08-19: 0.3 mg via INTRAVENOUS
  Administered 2012-08-19: 17:00:00 via INTRAVENOUS
  Administered 2012-08-19: 17 mg via INTRAVENOUS
  Administered 2012-08-19: 11.49 mg via INTRAVENOUS
  Filled 2012-08-19 (×4): qty 25

## 2012-08-19 NOTE — Progress Notes (Signed)
Internal Medicine Teaching Service Attending Note Date: 08/19/2012  Patient name: Dennis Bernard  Medical record number: [id number]  Date of birth: [birth year]    This patient has been seen and discussed with the house staff. Please see their note for complete details. I concur with their findings with the following additions/corrections: Pt being "locked out" of demand hydromorphone. Will increase basel rate from 1.5 to 2. Pt states once over hump, he improves quickly. We have had issues with opioid delivery so his improvement has been delayed.   Fergus Throne 08/19/2012, 3:28 PM

## 2012-08-19 NOTE — Progress Notes (Signed)
25mg  dilaudid wasted in sink with Jetta Lout RN.

## 2012-08-19 NOTE — Progress Notes (Signed)
97ml of Dilaudid wasted down sink.   2nd Witt[place]: [employer] RN

## 2012-08-19 NOTE — Progress Notes (Signed)
1ml dilaudid wasted in sharps with Seth Bake RN.

## 2012-08-19 NOTE — Progress Notes (Signed)
Subjective: The patient reports good pain relief with her current PCA settings of basal Dilaudid of 2 mg per hour and 0.6 mg every 8 minutes with a button push. He describes his pain as improved with these settings. He is happy with this improvement at the moment. We have experienced several interruptions with delivery the opiates. Now PCA is delivery stably.   Objective: Vital signs in last 24 hours: Filed Vitals:   08/19/12 1200 08/19/12 1311 08/19/12 1421 08/19/12 1600  BP:    105/53  Pulse: 72   72  Temp: 98.1 F (36.7 C)   98.6 F (37 C)  TempSrc: Oral   Oral  Resp:  16 18   Height:      Weight:      SpO2: 99% 98% 97% 100%   Weight change:   Intake/Output Summary (Last 24 hours) at 08/19/12 1627 Last data filed at 08/19/12 1600  Gross per 24 hour  Intake 5318.75 ml  Output   2500 ml  Net 2818.75 ml   Physical exam: onstitutional: Vital signs reviewed. Patient is a well-developed and well-nourished in mild acute distress due to pain and cooperative with exam. Alert and oriented x3.  Head: Normocephalic and atraumatic  Mouth: no erythema or exudates, MMM  Eyes: PERRL, EOMI, conjunctivae normal, No scleral icterus.  Neck: Supple, Trachea midline normal ROM, No JVD  Cardiovascular: RRR, S1 normal, S2 normal, no MRG, pulses symmetric and intact bilaterally  Pulmonary/Chest: CTAB, no wheezes, rales, or rhonchi  Abdominal: Soft. Non-tender, non-distended, bowel sounds are normal, no masses, organomegaly, or guarding present.  Musculoskeletal: No joint deformities, erythema, or stiffness, ROM full and no nontender.  Hematology: no cervical, inginal, or axillary adenopathy.  Neurological: A&O x3, Strength is normal and symmetric bilaterally, cranial nerve II-XII are grossly intact, no focal motor deficit, sensory intact to light touch bilaterally.  Skin: Warm, dry and intact. No rash, cyanosis, or clubbing.  Psychiatric: Normal mood and affect. speech and behavior is normal.  Judgment and thought content normal. Cognition and memory are normal.  Lab Results: Basic Metabolic Panel:  Lab 08/19/12 1914 08/18/12 1120  NA 141 137  K 3.8 4.1  CL 106 103  CO2 26 24  GLUCOSE 102* 83  BUN 6 5*  CREATININE 0.53 0.57  CALCIUM 9.1 8.7  MG -- --  PHOS -- --   Liver Function Tests:  Lab 08/17/12 0700  AST 18  ALT 15  ALKPHOS 90  BILITOT 2.1*  PROT 6.2  ALBUMIN 3.5   CBC  Lab 08/19/12 0500 08/18/12 1120 08/17/12 0700  WBC 7.3 8.1 --  NEUTROABS -- -- 1.6*  HGB 8.0* 8.1* --  HCT 23.2* 23.0* --  MCV 97.9 97.9 --  PLT 242 PLATELET CLUMPS NOTED ON SMEAR, UNABLE TO ESTIMATE --    Lab 08/16/12 0910  VITAMINB12 --  FOLATE --  FERRITIN --  TIBC --  IRON --  RETICCTPCT 8.3*   Studies/Results: No results found. Medications:  Scheduled Meds:    . enoxaparin (LOVENOX) injection  40 mg Subcutaneous Q24H  . folic acid  1 mg Oral Daily  . HYDROmorphone PCA 0.3 mg/mL   Intravenous Q4H  . sodium chloride  3 mL Intravenous Q12H  . [DISCONTINUED] HYDROmorphone PCA 0.3 mg/mL   Intravenous Q4H  . [DISCONTINUED] HYDROmorphone PCA 0.3 mg/mL   Intravenous Q4H  . [DISCONTINUED] HYDROmorphone PCA 0.3 mg/mL   Intravenous Q4H  . [DISCONTINUED] HYDROmorphone PCA 0.3 mg/mL   Intravenous Q4H  . [DISCONTINUED]  HYDROmorphone PCA 0.3 mg/mL   Intravenous Q4H   Continuous Infusions:    . sodium chloride 150 mL/hr at 08/19/12 0425  . [DISCONTINUED] HYDROmorphone Stopped (08/19/12 0425)   PRN Meds:.diphenhydrAMINE, HYDROmorphone, naloxone, ondansetron (ZOFRAN) IV, ondansetron, sodium chloride   Assessment/Plan: A pleasant 26 year old AA man with history of sickle cell disease who comes to the ED with complaints of left leg, low back and chest pain from sickle cell crisis for 2 days.   1) Acute sickle cell pain crisis: Patient with acute crisis of his sickle cell disease. Leg, back and chest pain secondary to crisis. Chest x-ray negative for infiltrates- so does not  have acute chest syndrome. Also patient does not have productive cough or fever. Possible pain triggered by cold weather and dehydration as per patient. His pain is now well controlled after he was transferred to the step down.  Plan  - Continue with the current settings PCA at a basal 2 mg every hour and 0.6 mg on button push. If needed, basal, and aortic can be increased by 0.5 mg every hour and the intermittent Dilaudid can be increased by 0.2 mg per hour in a stepwise manner. - there an order of PRN of 1.5mg  dilaudid if PCA gets a problem.  - Hb has been stable at around 8 since admission. Baseline is 7. We will continue to monitor daily. - We will continue normal saline at 150 cc per hour.    - Continue with Benadryl for itching -Once his pain resolves we can come up with an outpatient regimen. He takes dilaudid 8 mg tid as need for pain. He had not needed for the previous 2 months. - F/u with [facility] Sickle cell clinic set up on January 7 with Dr Willey Blade.  2) DVT prophylaxis: Heparin  Dispo: Disposition is deferred at this time, awaiting improvement of current medical problems.  Anticipated discharge in approximately 1-2 day(s).   The patient does not have a current PCP (DEFAULT,PROVIDER, MD), therefore will be requiring OPC follow-up after discharge.   The patient does not have transportation limitations that hinder transportation to clinic appointments.  .Services Needed at time of discharge: Y = Yes, Blank = No PT:   OT:   RN:   Equipment:   Other:     LOS: 3 days   Dow Adolph 08/19/2012, 4:27 PM

## 2012-08-19 NOTE — Progress Notes (Signed)
PCA pump cleared for 12:00.  9.87 mg Dilaudid delivered since 07:36am.

## 2012-08-19 NOTE — Progress Notes (Signed)
Wasted 2ml dilaudid in sink with Heloise Beecham, RN

## 2012-08-19 NOTE — Progress Notes (Signed)
PCA tubing tip broke off during syringe replacement. Pump paused for about 10 minutes for new tubing and syringe replacement with primary RN, charge RN and another Engineer, drilling at bedside.

## 2012-08-19 NOTE — Progress Notes (Addendum)
Wasted 3ml of Dilaudid with Gaspar Garbe, RN

## 2012-08-19 NOTE — Progress Notes (Signed)
Pump alarming with red warning, in to check. Found tubing with large bubble at soft part of tubing and dilaudid  bag was empty.Settings on pump reverified and noted to be correct.  Pt awake, talking. A&Ox4. Respirations even and unlabored. O2 sat 100% on 02@2L  [place]. BP 103/57. NSR on monitor. Dr. Garald Braver notified. Will monitor.

## 2012-08-20 LAB — CBC
HCT: 22.7 % — ABNORMAL LOW (ref 39.0–52.0)
MCH: 33.6 pg (ref 26.0–34.0)
MCHC: 34.4 g/dL (ref 30.0–36.0)
Platelets: 253 10*3/uL (ref 150–400)
RDW: 16.3 % — ABNORMAL HIGH (ref 11.5–15.5)

## 2012-08-20 LAB — BASIC METABOLIC PANEL
BUN: 6 mg/dL (ref 6–23)
Creatinine, Ser: 0.54 mg/dL (ref 0.50–1.35)
GFR calc Af Amer: >90 mL/min (ref >90)
GFR calc non Af Amer: >90 mL/min (ref >90)

## 2012-08-20 NOTE — Progress Notes (Signed)
Subjective: The patient continues to have report good pain relief with her current PCA settings of basal Dilaudid of 2 mg per hour and 0.6 mg every 8 minutes with a button push. PCA delivery is stable.    Objective: Vital signs in last 24 hours: Filed Vitals:   08/20/12 0725 08/20/12 0726 08/20/12 1127 08/20/12 1128  BP:  110/63  101/49  Pulse:      Temp: 98 F (36.7 C)  98.6 F (37 C)   TempSrc: Oral  Oral   Resp:   16   Height:      Weight:      SpO2:  100% 98% 99%   Weight change:   Intake/Output Summary (Last 24 hours) at 08/20/12 1521 Last data filed at 08/20/12 1439  Gross per 24 hour  Intake 4026.42 ml  Output   3520 ml  Net 506.42 ml   Physical exam: onstitutional: Vital signs reviewed. Patient is a well-developed and well-nourished in mild acute distress due to pain and cooperative with exam. Alert and oriented x3.  Head: Normocephalic and atraumatic  Mouth: no erythema or exudates, MMM  Eyes: PERRL, EOMI, conjunctivae normal, No scleral icterus.  Neck: Supple, Trachea midline normal ROM, No JVD  Cardiovascular: RRR, S1 normal, S2 normal, no MRG, pulses symmetric and intact bilaterally  Pulmonary/Chest: CTAB, no wheezes, rales, or rhonchi  Abdominal: Soft. Non-tender, non-distended, bowel sounds are normal, no masses, organomegaly, or guarding present.  Musculoskeletal: No joint deformities, erythema, or stiffness, ROM full and no nontender.  Hematology: no cervical, inginal, or axillary adenopathy.  Neurological: A&O x3, Strength is normal and symmetric bilaterally, cranial nerve II-XII are grossly intact, no focal motor deficit, sensory intact to light touch bilaterally.  Skin: Warm, dry and intact. No rash, cyanosis, or clubbing.  Psychiatric: Normal mood and affect. speech and behavior is normal. Judgment and thought content normal. Cognition and memory are normal.   Lab Results: Basic Metabolic Panel:  Lab 08/20/12 1610 08/19/12 0500  NA 139 141  K 4.1 3.8   CL 105 106  CO2 27 26  GLUCOSE 99 102*  BUN 6 6  CREATININE 0.54 0.53  CALCIUM 9.0 9.1  MG -- --  PHOS -- --   Liver Function Tests:  Lab 08/17/12 0700  AST 18  ALT 15  ALKPHOS 90  BILITOT 2.1*  PROT 6.2  ALBUMIN 3.5   CBC  Lab 08/20/12 0426 08/19/12 0500 08/17/12 0700  WBC 7.3 7.3 --  NEUTROABS -- -- 1.6*  HGB 7.8* 8.0* --  HCT 22.7* 23.2* --  MCV 97.8 97.9 --  PLT 253 242 --    Lab 08/16/12 0910  VITAMINB12 --  FOLATE --  FERRITIN --  TIBC --  IRON --  RETICCTPCT 8.3*   Medications:  Scheduled Meds:    . enoxaparin (LOVENOX) injection  40 mg Subcutaneous Q24H  . folic acid  1 mg Oral Daily  . HYDROmorphone PCA 0.3 mg/mL   Intravenous Q4H  . sodium chloride  3 mL Intravenous Q12H  . [DISCONTINUED] HYDROmorphone PCA 0.3 mg/mL   Intravenous Q4H   Continuous Infusions:    . sodium chloride 150 mL/hr at 08/20/12 0627   PRN Meds:.diphenhydrAMINE, HYDROmorphone, naloxone, ondansetron (ZOFRAN) IV, ondansetron, sodium chloride   Assessment/Plan: A pleasant 26 year old AA man with history of sickle cell disease who comes to the ED with complaints of left leg, low back and chest pain from sickle cell crisis for 2 days.   1) Acute sickle cell pain  crisis: [name] was admitted with acute crisis of his sickle cell disease involving his legs, back and chest for 2 days. He believed his pain had been triggered by cold weather and dehydration. Chest x-ray was negative for infiltrates ruling out pulmonary infection. He did not have productive cough or fever. Initially his pain was difficulty to control due to issues with PCA and medication delivery. He was transferred to Step down unit on 08/18/2012 to improve monitoring of his pain monitoring through PCA set at basal dilaudid of 2 mg per hour and 0.6 mg every 8 minutes with button push. This managed to control his pain nicely and he did not require increment in the doses. In addition he received Benadryl for itching.  He also received gentle hydration. We monitored his hemoglobin level closely and planned to transfuse him if needed. Hb remained stable as around 8. His baseline is 7 as reports by the patient. He gets his care at [facility] sickle cell clinic but he will transfer to [facility] since he has transportation difficulties. His appointment will be Dr Willey Blade on January 03/2012 at 2:00pm. Plan  - Continue with the current settings PCA at a basal 2 mg every hour and 0.6 mg on button push. If needed, basal, and aortic can be increased by 0.5 mg every hour and the intermittent Dilaudid can be increased by 0.2 mg per hour in a stepwise manner. - there an order of PRN of 1.5mg  dilaudid if PCA gets a problem.  - Hb has been stable at around 8 since admission. Baseline is 7. We will continue to monitor daily. - We will continue normal saline at 150 cc per hour.    - Continue with Benadryl for itching -Once his pain resolves we can come up with an outpatient regimen. He takes dilaudid 8 mg tid as need for pain. He had not needed for the previous 2 months.  2) DVT prophylaxis: Heparin  Dispo: Disposition is deferred at this time, awaiting improvement of current medical problems.  Anticipated discharge in approximately 1-2 day(s).   The patient does not have a current PCP (DEFAULT,PROVIDER, MD), therefore will be requiring OPC follow-up after discharge.   The patient does not have transportation limitations that hinder transportation to clinic appointments.  .Services Needed at time of discharge: Y = Yes, Blank = No PT:   OT:   RN:   Equipment:   Other:     LOS: 4 days   Dow Adolph 08/20/2012, 3:21 PM

## 2012-08-21 LAB — CBC WITH DIFFERENTIAL/PLATELET
Basophils Relative: 1 % (ref 0–1)
Hemoglobin: 7.8 g/dL — ABNORMAL LOW (ref 13.0–17.0)
Lymphs Abs: 4.2 10*3/uL — ABNORMAL HIGH (ref 0.7–4.0)
MCH: 34.4 pg — ABNORMAL HIGH (ref 26.0–34.0)
MCHC: 35 g/dL (ref 30.0–36.0)
Monocytes Relative: 8 % (ref 3–12)
Neutro Abs: 2.3 10*3/uL (ref 1.7–7.7)
Neutrophils Relative %: 32 % — ABNORMAL LOW (ref 43–77)
Platelets: 261 10*3/uL (ref 150–400)
RBC: 2.27 MIL/uL — ABNORMAL LOW (ref 4.22–5.81)
RDW: 16.3 % — ABNORMAL HIGH (ref 11.5–15.5)
WBC: 7.3 10*3/uL (ref 4.0–10.5)

## 2012-08-21 LAB — BASIC METABOLIC PANEL
Calcium: 9.3 mg/dL (ref 8.4–10.5)
Creatinine, Ser: 0.52 mg/dL (ref 0.50–1.35)
GFR calc Af Amer: >90 mL/min (ref >90)
GFR calc non Af Amer: >90 mL/min (ref >90)
Glucose, Bld: 96 mg/dL (ref 70–99)
Potassium: 3.5 mEq/L (ref 3.5–5.1)
Sodium: 141 mEq/L (ref 135–145)

## 2012-08-21 NOTE — Progress Notes (Signed)
Subjective: [name] continues to have a pain score of 7-8/10 however, he is happy with his current PCA settings of basal Dilaudid of 2 mg per hour and 0.6 mg every 8 minutes with a button push. PCA delivery is stable. I have discussed with him about the our next of gradually weaning him off the IV dilaudid and transitioning to PO meds. He wants [place] to try tomorrow hoping that his pain will be better.  Objective: Vital signs in last 24 hours: Filed Vitals:   08/21/12 1241 08/21/12 1522 08/21/12 1530 08/21/12 1531  BP:   108/57   Pulse:      Temp:    98.8 F (37.1 C)  TempSrc:    Oral  Resp:   18 16  Height:      Weight:      SpO2: 98% 100% 100% 94%   Weight change:   Intake/Output Summary (Last 24 hours) at 08/21/12 1756 Last data filed at 08/21/12 1522  Gross per 24 hour  Intake 933.04 ml  Output      0 ml  Net 933.04 ml   Physical exam: onstitutional: Vital signs reviewed. Patient is a well-developed and well-nourished in mild acute distress due to pain and cooperative with exam. Alert and oriented x3.  Head: Normocephalic and atraumatic  Mouth: no erythema or exudates, MMM  Eyes: PERRL, EOMI, conjunctivae normal, No scleral icterus.  Neck: Supple, Trachea midline normal ROM, No JVD  Cardiovascular: RRR, S1 normal, S2 normal, no MRG, pulses symmetric and intact bilaterally  Pulmonary/Chest: CTAB, no wheezes, rales, or rhonchi  Abdominal: Soft. Non-tender, non-distended, bowel sounds are normal, no masses, organomegaly, or guarding present.  Musculoskeletal: No joint deformities, erythema, or stiffness, ROM full and no nontender.  Hematology: no cervical, inginal, or axillary adenopathy.  Neurological: A&O x3, Strength is normal and symmetric bilaterally, cranial nerve II-XII are grossly intact, no focal motor deficit, sensory intact to light touch bilaterally.  Skin: Warm, dry and intact. No rash, cyanosis, or clubbing.  Psychiatric: Normal mood and affect. speech and  behavior is normal. Judgment and thought content normal. Cognition and memory are normal.   Lab Results: Basic Metabolic Panel:  Lab 08/21/12 1610 08/20/12 0426  NA 141 139  K 3.5 4.1  CL 107 105  CO2 27 27  GLUCOSE 96 99  BUN 8 6  CREATININE 0.52 0.54  CALCIUM 9.3 9.0  MG -- --  PHOS -- --   Liver Function Tests:  Lab 08/17/12 0700  AST 18  ALT 15  ALKPHOS 90  BILITOT 2.1*  PROT 6.2  ALBUMIN 3.5   CBC  Lab 08/21/12 0500 08/20/12 0426 08/17/12 0700  WBC 7.3 7.3 --  NEUTROABS 2.3 -- 1.6*  HGB 7.8* 7.8* --  HCT 22.3* 22.7* --  MCV 98.2 97.8 --  PLT 261 253 --    Lab 08/16/12 0910  VITAMINB12 --  FOLATE --  FERRITIN --  TIBC --  IRON --  RETICCTPCT 8.3*   Medications:  Scheduled Meds:    . enoxaparin (LOVENOX) injection  40 mg Subcutaneous Q24H  . folic acid  1 mg Oral Daily  . HYDROmorphone PCA 0.3 mg/mL   Intravenous Q4H  . sodium chloride  3 mL Intravenous Q12H   Continuous Infusions:    . sodium chloride 75 mL/hr at 08/21/12 0600   PRN Meds:.diphenhydrAMINE, HYDROmorphone, naloxone, ondansetron (ZOFRAN) IV, ondansetron, sodium chloride   Assessment/Plan: A pleasant 26 year old AA man with history of sickle cell disease who comes  to the ED with complaints of left leg, low back and chest pain from sickle cell crisis for 2 days.   1) Acute sickle cell pain crisis: [name] was admitted with acute crisis of his sickle cell disease involving his legs, back and chest for 2 days. He believed his pain had been triggered by cold weather and dehydration. Chest x-ray was negative for infiltrates ruling out pulmonary infection. He did not have productive cough or fever. Initially his pain was difficulty to control due to issues with PCA and medication delivery. He was transferred to Step down unit on 08/18/2012 to improve monitoring of his pain monitoring through PCA set at basal dilaudid of 2 mg per hour and 0.6 mg every 8 minutes with button push. This  managed to control his pain nicely and he did not require increment in the doses. In addition he received Benadryl for itching. He also received gentle hydration. We monitored his hemoglobin level closely and planned to transfuse him if needed. Hb remained stable as between 7.8 to 8. His baseline Hb is 7 as reported by the patient. He gets his care at [facility] sickle cell clinic but he will transfer to [facility] at [facility] since he has transportation difficulties. His appointment will be Dr Willey Blade on January 03/2012 at 2:00pm. His Hydroxyurea was discontinued based on low neutrophil count of 1.6. This will be addressed when he follows up as out patient. Plan  - Continue with the current settings PCA at a basal 2 mg every hour and 0.6 mg on button push. If needed, basal, and aortic can be increased by 0.5 mg every hour and the intermittent Dilaudid can be increased by 0.2 mg per hour in a stepwise manner. - there an order of PRN of 1.5mg  dilaudid if PCA gets a problem.  - Hb has been stable at around 8 since admission. Baseline is 7. We will continue to monitor daily. - We will continue normal saline at 150 cc per hour.    - Continue with Benadryl for itching -Once his pain resolves we can come up with an outpatient regimen. He takes dilaudid 8 mg tid as need for pain. He had not needed for the previous 2 months.  2) DVT prophylaxis: Heparin  Dispo: Disposition is deferred at this time, awaiting improvement of current medical problems.  Anticipated discharge in approximately 1-2 day(s).   The patient does not have a current PCP (DEFAULT,PROVIDER, MD), therefore will be requiring OPC follow-up after discharge.   The patient does not have transportation limitations that hinder transportation to clinic appointments.  .Services Needed at time of discharge: Y = Yes, Blank = No PT:   OT:   RN:   Equipment:   Other:     LOS: 5 days   Dow Adolph 08/21/2012, 5:56 PM

## 2012-08-22 DIAGNOSIS — D5700 Hb-SS disease with crisis, unspecified: Principal | ICD-10-CM

## 2012-08-22 LAB — CBC WITH DIFFERENTIAL/PLATELET
Basophils Relative: 1 % (ref 0–1)
HCT: 23.4 % — ABNORMAL LOW (ref 39.0–52.0)
Hemoglobin: 8.2 g/dL — ABNORMAL LOW (ref 13.0–17.0)
Lymphocytes Relative: 62 % — ABNORMAL HIGH (ref 12–46)
MCHC: 35 g/dL (ref 30.0–36.0)
Monocytes Relative: 7 % (ref 3–12)
Neutro Abs: 2.7 10*3/uL (ref 1.7–7.7)
Neutrophils Relative %: 28 % — ABNORMAL LOW (ref 43–77)
RBC: 2.39 MIL/uL — ABNORMAL LOW (ref 4.22–5.81)

## 2012-08-22 LAB — BASIC METABOLIC PANEL
BUN: 11 mg/dL (ref 6–23)
Chloride: 105 mEq/L (ref 96–112)
GFR calc Af Amer: >90 mL/min (ref >90)
GFR calc non Af Amer: >90 mL/min (ref >90)
Potassium: 3.9 mEq/L (ref 3.5–5.1)
Sodium: 140 mEq/L (ref 135–145)

## 2012-08-22 MED ORDER — HYDROMORPHONE 0.3 MG/ML IV SOLN: INTRAVENOUS | Status: DC

## 2012-08-22 MED ORDER — HYDROMORPHONE 0.3 MG/ML IV SOLN
INTRAVENOUS | Status: DC
  Administered 2012-08-22: 19.41 mg via INTRAVENOUS
  Administered 2012-08-22 – 2012-08-23 (×5): via INTRAVENOUS
  Filled 2012-08-22 (×5): qty 25

## 2012-08-22 MED ORDER — HYDROMORPHONE HCL 4 MG PO TABS
4.0000 mg | ORAL_TABLET | ORAL | Status: DC
  Administered 2012-08-22 – 2012-08-23 (×6): 4 mg via ORAL
  Filled 2012-08-22 (×6): qty 1

## 2012-08-22 MED ORDER — HYDROMORPHONE HCL 4 MG PO TABS: 4.0000 mg | ORAL_TABLET | ORAL | Status: DC | PRN

## 2012-08-22 NOTE — Progress Notes (Signed)
Report called to 5500 as ordered VSS, discomfort voiced PCA continued until  Finished then will start po  Pain med as order informed the nurse as to this order

## 2012-08-22 NOTE — Progress Notes (Signed)
Internal Medicine Teaching Service Attending Note Date: 08/22/2012  Patient name: Dennis Bernard  Medical record number: [id number]  Date of birth: 1985-10-31    This patient has been seen and discussed with the house staff. Please see their note for complete details. I concur with their findings with the following additions/corrections: Begin to taper hydromorphone basal rate while adding PO dilaudid.   Janda Cargo 08/22/2012, 2:01 PM

## 2012-08-22 NOTE — Progress Notes (Signed)
Subjective: [name] reports that his pain is now better to a 6/10 whereas his baseline is about 4.  He wants to try oral transition.    Objective: Vital signs in last 24 hours: Filed Vitals:   08/22/12 0721 08/22/12 0730 08/22/12 1130 08/22/12 1339  BP:  102/54 113/59   Pulse:  61 74   Temp:  97.9 F (36.6 C) 98.7 F (37.1 C)   TempSrc:  Oral Oral   Resp: 10 16 18 16   Height:      Weight:      SpO2: 100% 99% 98% 96%   Weight change:   Intake/Output Summary (Last 24 hours) at 08/22/12 1456 Last data filed at 08/22/12 0700  Gross per 24 hour  Intake   1575 ml  Output      0 ml  Net   1575 ml   Physical exam: onstitutional: Vital signs reviewed. Patient is a well-developed and well-nourished in mild acute distress due to pain and cooperative with exam. Alert and oriented x3.  Head: Normocephalic and atraumatic  Mouth: no erythema or exudates, MMM  Eyes: PERRL, EOMI, conjunctivae normal, No scleral icterus.  Neck: Supple, Trachea midline normal ROM, No JVD  Cardiovascular: RRR, S1 normal, S2 normal, no MRG, pulses symmetric and intact bilaterally  Pulmonary/Chest: CTAB, no wheezes, rales, or rhonchi  Abdominal: Soft. Non-tender, non-distended, bowel sounds are normal, no masses, organomegaly, or guarding present.  Musculoskeletal: No joint deformities, erythema, or stiffness, ROM full and no nontender.  Hematology: no cervical, inginal, or axillary adenopathy.  Neurological: A&O x3, Strength is normal and symmetric bilaterally, cranial nerve II-XII are grossly intact, no focal motor deficit, sensory intact to light touch bilaterally.  Skin: Warm, dry and intact. No rash, cyanosis, or clubbing.  Psychiatric: Normal mood and affect. speech and behavior is normal. Judgment and thought content normal. Cognition and memory are normal.   Lab Results: Basic Metabolic Panel:  Lab 08/22/12 1610 08/21/12 0500  NA 140 141  K 3.9 3.5  CL 105 107  CO2 27 27  GLUCOSE 102* 96   BUN 11 8  CREATININE 0.51 0.52  CALCIUM 9.2 9.3  MG -- --  PHOS -- --   Liver Function Tests:  Lab 08/17/12 0700  AST 18  ALT 15  ALKPHOS 90  BILITOT 2.1*  PROT 6.2  ALBUMIN 3.5   CBC  Lab 08/22/12 0436 08/21/12 0500  WBC 9.8 7.3  NEUTROABS 2.7 2.3  HGB 8.2* 7.8*  HCT 23.4* 22.3*  MCV 97.9 98.2  PLT 274 261    Lab 08/16/12 0910  VITAMINB12 --  FOLATE --  FERRITIN --  TIBC --  IRON --  RETICCTPCT 8.3*   Medications:  Scheduled Meds:    . enoxaparin (LOVENOX) injection  40 mg Subcutaneous Q24H  . folic acid  1 mg Oral Daily  . HYDROmorphone PCA 0.3 mg/mL   Intravenous Q4H  . sodium chloride  3 mL Intravenous Q12H  . [DISCONTINUED] HYDROmorphone PCA 0.3 mg/mL   Intravenous Q4H  . [DISCONTINUED] HYDROmorphone PCA 0.3 mg/mL   Intravenous Q4H   Continuous Infusions:    . sodium chloride 75 mL/hr at 08/22/12 1252   PRN Meds:.diphenhydrAMINE, HYDROmorphone, naloxone, ondansetron (ZOFRAN) IV, ondansetron, sodium chloride, [DISCONTINUED] HYDROmorphone   Assessment/Plan: A pleasant 26 year old AA man with history of sickle cell disease who comes to the ED with complaints of left leg, low back and chest pain from sickle cell crisis for 2 days.   1) Acute sickle cell  pain crisis: [name] was admitted with acute crisis of his sickle cell disease involving his legs, back and chest for 2 days. He believed his pain had been triggered by cold weather and dehydration. Chest x-ray was negative for infiltrates ruling out pulmonary infection. He did not have productive cough or fever. Initially his pain was difficulty to control due to issues with PCA and medication delivery. He was transferred to Step down unit on 08/18/2012 to improve monitoring of his pain monitoring through PCA set at basal dilaudid of 2 mg per hour and 0.6 mg every 8 minutes with button push. This regimen controled his pain nicely and he did not require increment in the doses. After 6 days, he was weaned  off the PCA with initiation of oral dilaudid. In addition, he received Benadryl for itching. He also received gentle hydration. We monitored his hemoglobin level closely and planned to transfuse him if needed. Hb remained stable as between 7.8 to 8. His baseline Hb is 7 as reported by the patient. He gets his care at [facility] sickle cell clinic but he will transfer to [facility] at [facility] since he has transportation difficulties. His appointment will be Dr Willey Blade on January 03/2012 at 2:00pm. His Hydroxyurea was discontinued based on low neutrophil count of 1.6. This will be addressed when he follows up as out patient. Plan  - reduced settings of PCA at a basal 1 mg every hour and 0.6 mg on button push. - started dilaudid 4 mg every 4 hours - He will be transferred to regular  - Hb has been stable at around 8 since admission. Baseline is 7. We will continue to monitor daily. - Continue with Benadryl for itching -Once his pain resolves we can come up with an outpatient regimen. He takes dilaudid 8 mg tid as need for pain. He had not needed for the previous 2 months.  2) DVT prophylaxis: Heparin  Dispo: Disposition is deferred at this time, awaiting improvement of current medical problems.  Anticipated discharge in approximately 1-2 day(s).   The patient does not have a current PCP (DEFAULT,PROVIDER, MD), therefore will be requiring OPC follow-up after discharge.   The patient does not have transportation limitations that hinder transportation to clinic appointments.  .Services Needed at time of discharge: Y = Yes, Blank = No PT:   OT:   RN:   Equipment:   Other:     LOS: 6 days   Dow Adolph 08/22/2012, 2:56 PM

## 2012-08-22 NOTE — Progress Notes (Signed)
1630 Patient arrived to floor from 2900 with Custom dose Dilaudid PCA orders verified with MD. Patient nontele per MD.Patient made aware of surrounding. Call bell within reach and continuous pulse ox applied.

## 2012-08-23 LAB — BASIC METABOLIC PANEL
BUN: 9 mg/dL (ref 6–23)
CO2: 27 mEq/L (ref 19–32)
Calcium: 9 mg/dL (ref 8.4–10.5)
Chloride: 107 mEq/L (ref 96–112)
Creatinine, Ser: 0.54 mg/dL (ref 0.50–1.35)
GFR calc Af Amer: >90 mL/min (ref >90)
GFR calc non Af Amer: >90 mL/min (ref >90)
Potassium: 3.9 mEq/L (ref 3.5–5.1)
Sodium: 141 mEq/L (ref 135–145)

## 2012-08-23 LAB — CBC WITH DIFFERENTIAL/PLATELET
Basophils Absolute: 0.1 10*3/uL (ref 0.0–0.1)
Eosinophils Relative: 2 % (ref 0–5)
HCT: 21.7 % — ABNORMAL LOW (ref 39.0–52.0)
Hemoglobin: 7.6 g/dL — ABNORMAL LOW (ref 13.0–17.0)
Lymphocytes Relative: 58 % — ABNORMAL HIGH (ref 12–46)
MCV: 97.7 fL (ref 78.0–100.0)
Monocytes Absolute: 0.8 10*3/uL (ref 0.1–1.0)
Monocytes Relative: 9 % (ref 3–12)
Neutro Abs: 2.8 10*3/uL (ref 1.7–7.7)
Neutrophils Relative %: 30 % — ABNORMAL LOW (ref 43–77)
RDW: 15.8 % — ABNORMAL HIGH (ref 11.5–15.5)
WBC: 9.2 10*3/uL (ref 4.0–10.5)

## 2012-08-23 MED ORDER — HYDROMORPHONE HCL 4 MG PO TABS: 4.0000 mg | ORAL_TABLET | ORAL | Status: DC

## 2012-08-23 MED ORDER — DIPHENHYDRAMINE HCL 25 MG PO TABS: 50.0000 mg | ORAL_TABLET | Freq: Three times a day (TID) | ORAL | Status: DC | PRN

## 2012-08-23 MED ORDER — SODIUM CHLORIDE 0.9 % IJ SOLN
10.0000 mL | INTRAMUSCULAR | Status: DC | PRN
  Administered 2012-08-23: 10 mL

## 2012-08-23 MED ORDER — HYDROMORPHONE 0.3 MG/ML IV SOLN: INTRAVENOUS | Status: DC

## 2012-08-23 NOTE — Progress Notes (Signed)
Subjective: [name] reports that his pain is now better to a 6/10 whereas his baseline is about 4.  He wants to advance quickly and possibly be discharged if pain continues to be better.   Objective: Vital signs in last 24 hours: Filed Vitals:   08/22/12 2243 08/23/12 0227 08/23/12 0519 08/23/12 0655  BP:   109/65   Pulse:   70   Temp:   98.4 F (36.9 C)   TempSrc:   Oral   Resp: 16 16 18 16   Height:      Weight:      SpO2: 98% 100% 100% 100%   Weight change:   Intake/Output Summary (Last 24 hours) at 08/23/12 1308 Last data filed at 08/23/12 0521  Gross per 24 hour  Intake      0 ml  Output    500 ml  Net   -500 ml   Physical exam: onstitutional: Vital signs reviewed. Patient is a well-developed and well-nourished in mild acute distress due to pain and cooperative with exam. Alert and oriented x3.  Head: Normocephalic and atraumatic  Mouth: no erythema or exudates, MMM  Eyes: PERRL, EOMI, conjunctivae normal, No scleral icterus.  Neck: Supple, Trachea midline normal ROM, No JVD  Cardiovascular: RRR, S1 normal, S2 normal, no MRG, pulses symmetric and intact bilaterally  Pulmonary/Chest: CTAB, no wheezes, rales, or rhonchi  Abdominal: Soft. Non-tender, non-distended, bowel sounds are normal, no masses, organomegaly, or guarding present.  Musculoskeletal: No joint deformities, erythema, or stiffness, ROM full and no nontender.  Hematology: no cervical, inginal, or axillary adenopathy.  Neurological: A&O x3, Strength is normal and symmetric bilaterally, cranial nerve II-XII are grossly intact, no focal motor deficit, sensory intact to light touch bilaterally.  Skin: Warm, dry and intact. No rash, cyanosis, or clubbing.  Psychiatric: Normal mood and affect. speech and behavior is normal. Judgment and thought content normal. Cognition and memory are normal.   Lab Results: Basic Metabolic Panel:  Lab 08/23/12 1478 08/22/12 0436  NA 141 140  K 3.9 3.9  CL 107 105  CO2 27  27  GLUCOSE 96 102*  BUN 9 11  CREATININE 0.54 0.51  CALCIUM 9.0 9.2  MG -- --  PHOS -- --   Liver Function Tests:  Lab 08/17/12 0700  AST 18  ALT 15  ALKPHOS 90  BILITOT 2.1*  PROT 6.2  ALBUMIN 3.5   CBC  Lab 08/23/12 0344 08/22/12 0436  WBC 9.2 9.8  NEUTROABS 2.8 2.7  HGB 7.6* 8.2*  HCT 21.7* 23.4*  MCV 97.7 97.9  PLT 241 274   No results found for this basename: VITAMINB12,FOLATE,FERRITIN,TIBC,IRON,RETICCTPCT in the last 168 hours Medications:  Scheduled Meds:    . enoxaparin (LOVENOX) injection  40 mg Subcutaneous Q24H  . folic acid  1 mg Oral Daily  . HYDROmorphone  4 mg Oral Q4H  . sodium chloride  3 mL Intravenous Q12H  . [DISCONTINUED] HYDROmorphone PCA 0.3 mg/mL   Intravenous Q4H  . [DISCONTINUED] HYDROmorphone PCA 0.3 mg/mL   Intravenous Q4H  . [DISCONTINUED] HYDROmorphone PCA 0.3 mg/mL   Intravenous Q4H  . [DISCONTINUED] HYDROmorphone PCA 0.3 mg/mL   Intravenous Q4H   Continuous Infusions:    . [DISCONTINUED] sodium chloride 75 mL/hr at 08/22/12 2245   PRN Meds:.diphenhydrAMINE, naloxone, ondansetron (ZOFRAN) IV, ondansetron, sodium chloride, sodium chloride, [DISCONTINUED] HYDROmorphone, [DISCONTINUED] HYDROmorphone   Assessment/Plan: A pleasant 26 year old AA man with history of sickle cell disease who comes to the ED with complaints of left leg,  low back and chest pain from sickle cell crisis for 2 days.   Acute sickle cell pain crisis: [name] was admitted with acute crisis of his sickle cell disease involving his legs, back and chest for 2 days. He believed his pain had been triggered by cold weather and dehydration. Chest x-ray was negative for infiltrates ruling out pulmonary infection. He did not have productive cough or fever. Initially his pain was difficulty to control due to issues with PCA and medication delivery. He was transferred to Step down unit on 08/18/2012 to improve monitoring of his pain monitoring through PCA set at basal  dilaudid of 2 mg per hour and 0.6 mg every 8 minutes with button push. This regimen controled his pain nicely and he did not require increment in the doses. After 6 days, he was weaned off the PCA with initiation of oral dilaudid and on the next day his pain was well controlled on PO dilaudid. In addition, he received Benadryl for itching. He also received gentle hydration. We monitored his hemoglobin level closely and planned to transfuse him if needed. Hb remained stable as between 7.8 to 8 with discharge  Hb of 7.6. His baseline Hb is 7 as reported by the patient. He gets his care at [facility] sickle cell clinic but he will transfer to [facility] at [facility] since he has transportation difficulties. His appointment will be Dr Willey Blade on January 03/2012 at 2:00pm. His Hydroxyurea was discontinued based on low neutrophil count of 1.6. This will be addressed when he follows up as out patient. Review of his records indicated that he in the past he violated his pain medication contract after he sought pain medications from [facility]. He was discharged with 30 tablets of dilaudid to take q4hrs PRN for pain until his outpatient appointment.  2) DVT prophylaxis: Heparin  Dispo: Disposition is deferred at this time, awaiting improvement of current medical problems.  Anticipated discharge in approximately 1-2 day(s).   The patient does not have a current PCP (DEFAULT,PROVIDER, MD), therefore will be requiring OPC follow-up after discharge.   The patient does not have transportation limitations that hinder transportation to clinic appointments.  .Services Needed at time of discharge: Y = Yes, Blank = No PT:   OT:   RN:   Equipment:   Other:     LOS: 7 days   Dennis Bernard 08/23/2012, 1:08 PM

## 2012-08-23 NOTE — Discharge Summary (Signed)
Internal Medicine Teaching [facility] Discharge Note  Name: Dennis Bernard MRN: [id number] DOB: [birth year] 26 y.o.  Date of Admission: 08/16/2012  8:02 AM Date of Discharge: 08/23/2012 Attending Physician: Burns Spain, MD  Discharge Diagnosis: Principal Problem:  *Sickle cell pain crisis   Discharge Medications:   Medication List     As of 08/23/2012  1:08 PM    STOP taking these medications         hydroxyurea 500 MG capsule   Commonly known as: HYDREA      TAKE these medications         diphenhydrAMINE 25 MG tablet   Commonly known as: BENADRYL   Take 2 tablets (50 mg total) by mouth every 8 (eight) hours as needed for itching.      folic acid 1 MG tablet   Commonly known as: FOLVITE   Take 1 mg by mouth daily.      HYDROmorphone 4 MG tablet   Commonly known as: DILAUDID   Take 1 tablet (4 mg total) by mouth every 4 (four) hours.        Disposition and follow-up:   Mr.Dennis Bernard was discharged from [facility] in stable condition.   At the hospital follow up visit please address  Please note that the patient had no current PCP in the area and would desire to establish long term care at the clinic   Hydroxyurea was discontinued after CBC revealed neutropenia of 1.6. Please consider repeating CBC after two weeks and decide whether he can resume this medication   Please note that the patient has a history of violating his pain medication contract at [facility] as revealed by discharge records from [facility]. They were given to records to scanning and up loading into epic. He was discharged with 30 tablets of 4 mg dilaudid to take q4hr prn for severe pain.   Follow-up Appointments:     Follow-up Information    Follow up with [name], [name], MD. On 09/20/2012. (2:00PM)    Contact information:   [address] [place] [address] [phone]         Discharge Orders    Future Orders Please Complete By Expires   Diet - low  sodium heart healthy      Diet - low sodium heart healthy      Increase activity slowly      Increase activity slowly      Call MD for:  severe uncontrolled pain         Consultations:  None  Procedures Performed:  Dg Chest 2 View  08/16/2012  *RADIOLOGY REPORT*  Clinical Data: Chest pain, sickle cell crisis  CHEST - 2 VIEW  Comparison: None.  Findings: Cardiomediastinal silhouette is unremarkable.  There is left IJ central line with tip in SVC right atrium junction.  No acute infiltrate or pulmonary edema.  Bony thorax is unremarkable. No diagnostic pneumothorax.  IMPRESSION: No acute infiltrate or pulmonary edema.  Left IJ central line with tip in SVC right atrium junction.  No diagnostic pneumothorax.   Original Report Authenticated By: Natasha Mead, M.D.      Admission HPI:  [name] is a pleasant 26 year old man with history of sickle cell disease who comes to the ED with complaints of left leg, low back and chest pain from sickle cell crisis. He reports started having pain in his left thigh and low-back 2 days before- which gradually got worse. These are his typical sickle pain  crisis symptoms, nothing unusual. He felt dehydrated as was not drinking enough water and also says cold weather might have contributed to bring on this crisis. He started having chest pain sometime around yesterday - substernal , nonradiating , pressure-like, associated with shortness of breath. There have this kind of chest pain before. Does not have history of MI. Not associated with palpitation or dizziness, vomiting, diaphoresis. It is associated with dry cough. No productive sputum. Denies any fever but has chills. Feels a little nauseous. Denies any abdominal pain, diarrhea, focal weakness, numbness, headache, vision changes. He does not have frequent sickle cell crisis. His last blood transfusion was about 2 years before. He has a Port-A-Cath in place. Review of Systems:  As per HPI. 12 points review of  systems done and negative.  Physical Exam:  Blood pressure 107/64, pulse 82, temperature 98.2 F (36.8 C), temperature source Oral, resp. rate 16, SpO2 100.00%.  Constitutional: Vital signs reviewed. Patient is a well-developed and well-nourished in mild acute distress due to pain and cooperative with exam. Alert and oriented x3.  Head: Normocephalic and atraumatic  Mouth: no erythema or exudates, MMM  Eyes: PERRL, EOMI, conjunctivae normal, No scleral icterus.  Neck: Supple, Trachea midline normal ROM, No JVD  Cardiovascular: RRR, S1 normal, S2 normal, no MRG, pulses symmetric and intact bilaterally  Pulmonary/Chest: CTAB, no wheezes, rales, or rhonchi  Abdominal: Soft. Non-tender, non-distended, bowel sounds are normal, no masses, organomegaly, or guarding present.  Musculoskeletal: No joint deformities, erythema, or stiffness, ROM full and no nontender. L thigh tenderness. Hematology: no cervical, inginal, or axillary adenopathy.  Neurological: A&O x3, Strength is normal and symmetric bilaterally, cranial nerve II-XII are grossly intact, no focal motor deficit, sensory intact to light touch bilaterally.  Skin: Warm, dry and intact. No rash, cyanosis, or clubbing.  Psychiatric: Normal mood and affect. speech and behavior is normal. Judgment and thought content normal. Cognition and memory are normal.   Hospital Course by problem list: Acute sickle cell pain crisis: [name] was admitted with acute crisis of his sickle cell disease involving his legs, back and chest for 2 days. He believed his pain had been triggered by cold weather and dehydration. Chest x-ray was negative for infiltrates ruling out pulmonary infection. He did not have productive cough or fever. Initially his pain was difficulty to control due to issues with PCA and medication delivery. He was transferred to Step down unit on 08/18/2012 to improve monitoring of his pain monitoring through PCA set at basal dilaudid of 2 mg per  hour and 0.6 mg every 8 minutes with button push. This regimen controled his pain nicely and he did not require increment in the doses. After 6 days, he was weaned off the PCA with initiation of oral dilaudid and on the next day his pain was well controlled on PO dilaudid. In addition, he received Benadryl for itching. He also received gentle hydration. We monitored his hemoglobin level closely and planned to transfuse him if needed. Hb remained stable as between 7.8 to 8 with discharge  Hb of 7.6. His baseline Hb is 7 as reported by the patient. He gets his care at [facility] sickle cell clinic but he will transfer to [facility] since he has transportation difficulties. His appointment will be Dr Willey Blade on January 03/2012 at 2:00pm. His Hydroxyurea was discontinued based on low neutrophil count of 1.6. This will be addressed when he follows up as out patient. Review of his records  indicated that he in the past he violated his pain medication contract after he sought pain medications from [facility]. He was discharged with 30 tablets of dilaudid to take q4hrs PRN for pain until his outpatient appointment.   Discharge Vitals:  BP 109/65  Pulse 70  Temp 98.4 F (36.9 C) (Oral)  Resp 16  Ht 5\' 9"  (1.753 m)  Wt 174 lb 4.8 oz (79.062 kg)  BMI 25.74 kg/m2  SpO2 100%  Discharge Labs:  Results for orders placed during the hospital encounter of 08/16/12 (from the past 24 hour(s))  BASIC METABOLIC PANEL     Status: Normal   Collection Time   08/23/12  3:44 AM      Component Value Range   Sodium 141  135 - 145 mEq/L   Potassium 3.9  3.5 - 5.1 mEq/L   Chloride 107  96 - 112 mEq/L   CO2 27  19 - 32 mEq/L   Glucose, Bld 96  70 - 99 mg/dL   BUN 9  6 - 23 mg/dL   Creatinine, Ser 4.09  0.50 - 1.35 mg/dL   Calcium 9.0  8.4 - 81.1 mg/dL   GFR calc non Af Amer >90  >90 mL/min   GFR calc Af Amer >90  >90 mL/min  CBC WITH DIFFERENTIAL     Status: Abnormal   Collection Time   08/23/12  3:44 AM       Component Value Range   WBC 9.2  4.0 - 10.5 K/uL   RBC 2.22 (*) 4.22 - 5.81 MIL/uL   Hemoglobin 7.6 (*) 13.0 - 17.0 g/dL   HCT 91.4 (*) 78.2 - 95.6 %   MCV 97.7  78.0 - 100.0 fL   MCH 34.2 (*) 26.0 - 34.0 pg   MCHC 35.0  30.0 - 36.0 g/dL   RDW 21.3 (*) 08.6 - 57.8 %   Platelets 241  150 - 400 K/uL   Neutrophils Relative 30 (*) 43 - 77 %   Neutro Abs 2.8  1.7 - 7.7 K/uL   Lymphocytes Relative 58 (*) 12 - 46 %   Lymphs Abs 5.3 (*) 0.7 - 4.0 K/uL   Monocytes Relative 9  3 - 12 %   Monocytes Absolute 0.8  0.1 - 1.0 K/uL   Eosinophils Relative 2  0 - 5 %   Eosinophils Absolute 0.2  0.0 - 0.7 K/uL   Basophils Relative 1  0 - 1 %   Basophils Absolute 0.1  0.0 - 0.1 K/uL    Signed: Dow Adolph 08/23/2012, 1:08 PM   Time Spent on Discharge: 37 minutes.

## 2012-08-23 NOTE — Plan of Care (Signed)
Problem: Phase III Progression Outcomes Goal: Pain controlled on oral analgesia Tolerarting  Oral medications

## 2012-08-23 NOTE — Progress Notes (Signed)
1430 Discharge instructions reviewed with patient verbalize and understand. Prescription given to patient by MD.Skin WNL. Port-a -cath intact to left upper chest.

## 2012-08-23 NOTE — Progress Notes (Signed)
Internal Medicine Teaching Service Attending Note Date: 08/23/2012  Patient name: Dennis Bernard  Medical record number: [id number]  Date of birth: June 16, 1986    This patient has been seen and discussed with the house staff. Please see their note for complete details. I concur with their findings with the following additions/corrections: Dr Kirtland Bouchard is transitioning from PCA to PO Dilaudid. So far, pain well controlled. Dr Kirtland Bouchard is planning on D/C today with F/U in sickle cell clinic in [place] [facility]  [facility] 08/23/2012, 11:50 AM

## 2012-08-23 NOTE — Care Management Note (Signed)
    Page 1 of 1   08/23/2012     3:36:41 PM   CARE MANAGEMENT NOTE 08/23/2012  Patient:  Dennis Bernard, Dennis Bernard   Account Number:  [id number]  Date Initiated:  08/22/2012  Documentation initiated by:  Junius Creamer  Subjective/Objective Assessment:   sickle cell crisis     Action/Plan:   lives alone   Anticipated DC Date:  08/23/2012   Anticipated DC Plan:  HOME/SELF CARE      DC Planning Services  CM consult      Choice offered to / List presented to:             Status of service:  Completed, signed off Medicare Important Message given?   (If response is "NO", the following Medicare IM given date fields will be blank) Date Medicare IM given:   Date Additional Medicare IM given:    Discharge Disposition:  HOME/SELF CARE  Per UR Regulation:  Reviewed for med. necessity/level of care/duration of stay  If discussed at Long Length of Stay Meetings, dates discussed:   08/23/2012    Comments:  08/23/12 15:35 Letha Cape RN, BSN [phone] patient lives alone, pta independent.  No needs anticipated.  Patient for dc today.  12/9 13:40p debbie dowell rn,bsn [phone]

## 2012-08-25 ENCOUNTER — Observation Stay (HOSPITAL_COMMUNITY)
Admission: EM | Admit: 2012-08-25 | Discharge: 2012-08-27 | Disposition: A | Discharge status: Home / Self Care | Payer: Medicare Other | Attending: Internal Medicine | Admitting: Internal Medicine

## 2012-08-25 ENCOUNTER — Encounter (HOSPITAL_COMMUNITY): Payer: Self-pay | Admitting: *Deleted

## 2012-08-25 DIAGNOSIS — D5700 Hb-SS disease with crisis, unspecified: Principal | ICD-10-CM | POA: Insufficient documentation

## 2012-08-25 LAB — CBC WITH DIFFERENTIAL/PLATELET
Basophils Absolute: 0 10*3/uL (ref 0.0–0.1)
Basophils Relative: 0 % (ref 0–1)
Eosinophils Absolute: 0 10*3/uL (ref 0.0–0.7)
Eosinophils Relative: 0 % (ref 0–5)
HCT: 25.8 % — ABNORMAL LOW (ref 39.0–52.0)
Hemoglobin: 8.9 g/dL — ABNORMAL LOW (ref 13.0–17.0)
MCH: 33.7 pg (ref 26.0–34.0)
MCHC: 34.5 g/dL (ref 30.0–36.0)
MCV: 97.7 fL (ref 78.0–100.0)
Monocytes Absolute: 0.7 10*3/uL (ref 0.1–1.0)
Monocytes Relative: 7 % (ref 3–12)
Neutro Abs: 7.2 10*3/uL (ref 1.7–7.7)
RDW: 16 % — ABNORMAL HIGH (ref 11.5–15.5)

## 2012-08-25 LAB — URINALYSIS, ROUTINE W REFLEX MICROSCOPIC
Glucose, UA: NEGATIVE mg/dL
Hgb urine dipstick: NEGATIVE
Ketones, ur: NEGATIVE mg/dL
Leukocytes, UA: NEGATIVE
Protein, ur: NEGATIVE mg/dL
pH: 5.5 (ref 5.0–8.0)

## 2012-08-25 LAB — BASIC METABOLIC PANEL
BUN: 14 mg/dL (ref 6–23)
Calcium: 9.8 mg/dL (ref 8.4–10.5)
Chloride: 104 mEq/L (ref 96–112)
Creatinine, Ser: 0.7 mg/dL (ref 0.50–1.35)
GFR calc Af Amer: >90 mL/min (ref >90)
GFR calc non Af Amer: >90 mL/min (ref >90)

## 2012-08-25 LAB — RETICULOCYTES: Retic Ct Pct: 8.4 % — ABNORMAL HIGH (ref 0.4–3.1)

## 2012-08-25 MED ORDER — DIPHENHYDRAMINE HCL 50 MG/ML IJ SOLN
12.5000 mg | Freq: Once | INTRAMUSCULAR | Status: AC
  Administered 2012-08-25: 12.5 mg via INTRAVENOUS
  Filled 2012-08-25: qty 1

## 2012-08-25 MED ORDER — ONDANSETRON HCL 4 MG/2ML IJ SOLN
4.0000 mg | Freq: Once | INTRAMUSCULAR | Status: AC
  Administered 2012-08-25: 4 mg via INTRAVENOUS
  Filled 2012-08-25: qty 2

## 2012-08-25 MED ORDER — HYDROMORPHONE HCL PF 2 MG/ML IJ SOLN
2.0000 mg | INTRAMUSCULAR | Status: AC | PRN
  Administered 2012-08-25 (×3): 2 mg via INTRAVENOUS
  Filled 2012-08-25 (×3): qty 1

## 2012-08-25 NOTE — ED Notes (Signed)
Patients states still unable to provide urine specimen at this time.

## 2012-08-25 NOTE — ED Notes (Signed)
Patient unable to provide urine sample at this time. Urinal at bedside. 

## 2012-08-25 NOTE — ED Notes (Signed)
Received pt from [facility]. Pt continues to rate pain 10/10.

## 2012-08-25 NOTE — ED Notes (Signed)
Pt from home with reports of sickle cell pain in the left leg and lower back that started on Sunday. Pt endorses being treated at [facility] with minimal relief on Sunday.

## 2012-08-25 NOTE — ED Provider Notes (Signed)
I discussed with Dr. Anselm Jungling from Internal medicine at [facility]. During his last hospitalization, they thought he might have stolen some narcotics. However, his reticulocyte count was elevated. I transferred her to CDU at [facility] for patient to be evaluated by internal medicine. I discussed with Dr. Juleen China from the ED and the CDU PA.   Richardean Canal, MD 08/25/12 2108

## 2012-08-25 NOTE — ED Notes (Signed)
[id number]> Expected date:<BR> Expected time:<BR> Means of arrival:<BR> Comments:<BR> Sickle cell pain in triage, pt physically moved to room.

## 2012-08-25 NOTE — ED Notes (Signed)
Internal medicine informed

## 2012-08-25 NOTE — ED Provider Notes (Signed)
Patient arrives in the CDU as a transfer from [facility] to be seen by the internal medicine clinic for evaluation and disposition in the setting sickle cell with pain and elevated reticulocyte count.  Patient awake, alert, and continues to report pain.  Internal medicine paged to see patient.  [name], NP 08/26/12 [phone]

## 2012-08-25 NOTE — ED Provider Notes (Signed)
History     CSN: [id number]  Arrival date & time 08/25/12  1652   First MD Initiated Contact with Patient 08/25/12 1713      Chief Complaint  Patient presents with  . Sickle Cell Pain Crisis    (Consider location/radiation/quality/duration/timing/severity/associated sxs/prior treatment) HPI Comments: Patient with history of hemoglobin SS disease presents with continued left lower extremity and lower back pain. Patient states that the pain has been worse for the past 2 days. Patient was recently in the hospital for one week until 08/23/2012. Symptoms are the same now as during previous admission. He was discharged home with hydromorphone 4 mg tablets which have not been controlling his pain. Patient has required transfusions in the distant past. Has history of pneumonia. Denies fever, cough, shortness of breath, chest pain, abdominal pain, diarrhea, urinary symptoms. Denies skin rash. Onset gradual. Course is constant. Nothing makes symptoms better or worse.  Patient is a 26 y.o. male presenting with sickle cell pain. The history is provided by the patient and medical records.  Sickle Cell Pain Crisis  Associated symptoms include back pain. Pertinent negatives include no chest pain, no abdominal pain, no diarrhea, no nausea, no vomiting, no dysuria, no headaches, no rhinorrhea, no sore throat, no cough, no rash and no eye redness.    Past Medical History  Diagnosis Date  . Sickle cell disease     Past Surgical History  Procedure Date  . Cholecystectomy     Family History  Problem Relation Age of Onset  . Sickle cell anemia Brother   . Sickle cell trait Father   . Sickle cell trait Mother   . Sickle cell anemia Paternal Uncle     History  Substance Use Topics  . Smoking status: Never Smoker   . Smokeless tobacco: Never Used  . Alcohol Use: No      Review of Systems  Constitutional: Negative for fever and chills.  HENT: Negative for sore throat and rhinorrhea.     Eyes: Negative for redness.  Respiratory: Negative for cough and shortness of breath.   Cardiovascular: Negative for chest pain.  Gastrointestinal: Negative for nausea, vomiting, abdominal pain and diarrhea.  Genitourinary: Negative for dysuria.  Musculoskeletal: Positive for myalgias, back pain and arthralgias.  Skin: Negative for rash.  Neurological: Negative for headaches.    Allergies  Review of patient's allergies indicates no known allergies.  Home Medications   Current Outpatient Rx  Name  Route  Sig  Dispense  Refill  . DIPHENHYDRAMINE HCL 25 MG PO TABS   Oral   Take 2 tablets (50 mg total) by mouth every 8 (eight) hours as needed for itching.   15 tablet   0   . FOLIC ACID 1 MG PO TABS   Oral   Take 1 mg by mouth daily.         Marland Kitchen HYDROMORPHONE HCL 4 MG PO TABS   Oral   Take 1 tablet (4 mg total) by mouth every 4 (four) hours.   30 tablet   0     BP 133/74  Pulse 108  Temp 99.4 F (37.4 C) (Oral)  Resp 18  SpO2 99%  Physical Exam  Nursing note and vitals reviewed. Constitutional: He appears well-developed and well-nourished.  HENT:  Head: Normocephalic and atraumatic.  Eyes: Conjunctivae normal are normal. Right eye exhibits no discharge. Left eye exhibits no discharge.  Neck: Normal range of motion. Neck supple.  Cardiovascular: Normal rate, regular rhythm and normal heart sounds.  Pulmonary/Chest: Effort normal and breath sounds normal.  Abdominal: Soft. There is no tenderness.  Musculoskeletal:       Left hip: He exhibits normal range of motion and normal strength.       Lumbar back: He exhibits tenderness and bony tenderness. He exhibits normal range of motion.       Back:       Left upper leg: He exhibits tenderness and bony tenderness.       Legs: Neurological: He is alert.  Skin: Skin is warm and dry.  Psychiatric: He has a normal mood and affect.    ED Course  Procedures (including critical care time)  Labs Reviewed  CBC WITH  DIFFERENTIAL - Abnormal; Notable for the following:    WBC 10.6 (*)     RBC 2.64 (*)     Hemoglobin 8.9 (*)     HCT 25.8 (*)     RDW 16.0 (*)     All other components within normal limits  RETICULOCYTES - Abnormal; Notable for the following:    Retic Ct Pct 8.4 (*)     RBC. 2.64 (*)     Retic Count, Manual 221.8 (*)     All other components within normal limits  BASIC METABOLIC PANEL  URINALYSIS, ROUTINE W REFLEX MICROSCOPIC   X-ray Chest Pa And Lateral   08/26/2012  *RADIOLOGY REPORT*  Clinical Data: Low back and left leg pain after sickle cell crisis.  CHEST - 2 VIEW  Comparison: 08/16/2012  Findings: Shallow inspiration.  Normal heart size and pulmonary vascularity.  Increased interstitial markings may be related to sickle cell.  No focal consolidation.  No blunting of costophrenic angles.  No pneumothorax.  Central venous catheter remains unchanged in position.  Postoperative changes in the left shoulder. No significant change since previous study.  IMPRESSION: Shallow inspiration.  No evidence of active pulmonary disease.   Original Report Authenticated By: Burman Nieves, M.D.    Dg Lumbar Spine Complete  08/26/2012  *RADIOLOGY REPORT*  Clinical Data: Low back and leg pain.  Sickle cell crisis.  LUMBAR SPINE - COMPLETE 4+ VIEW  Comparison: None.  Findings: Five lumbar type vertebral bodies.  Mild endplate sclerosis and depression compatible with sickle cell changes. Normal alignment of the lumbar spine.  No vertebral bone lesion or destruction.  Incidental note of sclerosis suggested in the right femoral head which could represent bone necrosis.  IMPRESSION: Sickle cell changes in the lumbar spine.  No displaced fractures. Possible necrosis of the right femoral head.   Original Report Authenticated By: Burman Nieves, M.D.    Dg Femur Left  08/26/2012  *RADIOLOGY REPORT*  Clinical Data: Low back and leg pain.  Sickle cell crisis.  LEFT FEMUR - 2 VIEW  Comparison: None.  Findings:  Mild noncircumscribed sclerosis in the distal shaft and distal metaphyseal region of the left femur.  Changes may represent bone infarcts.  No evidence of acute fracture or subluxation.  No abnormal cortical erosion or expansile lesion.  No radiopaque soft tissue foreign bodies.  IMPRESSION: Suggestion of sclerosis in the distal femoral shaft and metaphyseal region compatible with early bone infarct change.   Original Report Authenticated By: Burman Nieves, M.D.      1. Sickle cell pain crisis     5:26 PM Patient seen and examined. Previous hospitalization reviewed. Work-up initiated. Medications ordered. Temp 99.71F. Symptoms unchanged from previous. Given current symptoms, I am not concerned for acute chest syndrome.   Vital signs reviewed and are  as follows: Filed Vitals:   08/25/12 1704  BP: 133/74  Pulse: 108  Temp: 99.4 F (37.4 C)  Resp: 18   8:28 PM Patient still in significant pain after 2mg  x 3. Will call [facility] for admission.   Handoff to Dr. Silverio Lay at shift change.    MDM  Admit sickle cell crisis, vasoocclusive.         Renne Crigler, [place] 08/27/12 [phone]

## 2012-08-26 ENCOUNTER — Encounter (HOSPITAL_COMMUNITY): Payer: Self-pay | Admitting: *Deleted

## 2012-08-26 ENCOUNTER — Emergency Department (HOSPITAL_COMMUNITY): Payer: Medicare Other

## 2012-08-26 DIAGNOSIS — D5700 Hb-SS disease with crisis, unspecified: Principal | ICD-10-CM

## 2012-08-26 LAB — BASIC METABOLIC PANEL
BUN: 10 mg/dL (ref 6–23)
GFR calc Af Amer: >90 mL/min (ref >90)
GFR calc non Af Amer: >90 mL/min (ref >90)
Potassium: 3.5 mEq/L (ref 3.5–5.1)
Sodium: 141 mEq/L (ref 135–145)

## 2012-08-26 LAB — SEDIMENTATION RATE: Sed Rate: 5 mm/hr (ref 0–16)

## 2012-08-26 LAB — LACTIC ACID, PLASMA: Lactic Acid, Venous: 1.3 mmol/L (ref 0.5–2.2)

## 2012-08-26 LAB — CBC WITH DIFFERENTIAL/PLATELET
Basophils Absolute: 0.1 10*3/uL (ref 0.0–0.1)
Basophils Relative: 1 % (ref 0–1)
Hemoglobin: 7.7 g/dL — ABNORMAL LOW (ref 13.0–17.0)
Lymphocytes Relative: 39 % (ref 12–46)
MCHC: 34.4 g/dL (ref 30.0–36.0)
Monocytes Relative: 8 % (ref 3–12)
Neutro Abs: 5.1 10*3/uL (ref 1.7–7.7)
Neutrophils Relative %: 52 % (ref 43–77)
WBC: 9.8 10*3/uL (ref 4.0–10.5)

## 2012-08-26 MED ORDER — OXYCODONE HCL 5 MG PO TABS
5.0000 mg | ORAL_TABLET | ORAL | Status: DC | PRN
  Administered 2012-08-26 (×2): 5 mg via ORAL
  Filled 2012-08-26 (×2): qty 1

## 2012-08-26 MED ORDER — MORPHINE SULFATE 15 MG PO TABS
45.0000 mg | ORAL_TABLET | ORAL | Status: DC | PRN
  Administered 2012-08-27 (×3): 45 mg via ORAL
  Filled 2012-08-26 (×3): qty 3

## 2012-08-26 MED ORDER — MORPHINE SULFATE ER 30 MG PO TBCR: 60.0000 mg | EXTENDED_RELEASE_TABLET | Freq: Two times a day (BID) | ORAL | Status: DC

## 2012-08-26 MED ORDER — MORPHINE SULFATE 15 MG PO TABS: 45.0000 mg | ORAL_TABLET | ORAL | Status: DC | PRN

## 2012-08-26 MED ORDER — ACETAMINOPHEN 325 MG PO TABS: 650.0000 mg | ORAL_TABLET | Freq: Four times a day (QID) | ORAL | Status: DC | PRN

## 2012-08-26 MED ORDER — ACETAMINOPHEN 650 MG RE SUPP: 650.0000 mg | Freq: Four times a day (QID) | RECTAL | Status: DC | PRN

## 2012-08-26 MED ORDER — ONDANSETRON HCL 4 MG PO TABS: 4.0000 mg | ORAL_TABLET | Freq: Four times a day (QID) | ORAL | Status: DC | PRN

## 2012-08-26 MED ORDER — MORPHINE SULFATE 15 MG PO TABS
30.0000 mg | ORAL_TABLET | ORAL | Status: DC | PRN
  Administered 2012-08-26: 15 mg via ORAL
  Filled 2012-08-26: qty 1

## 2012-08-26 MED ORDER — SODIUM CHLORIDE 0.9 % IJ SOLN
10.0000 mL | INTRAMUSCULAR | Status: DC | PRN
  Administered 2012-08-26 – 2012-08-27 (×3): 10 mL

## 2012-08-26 MED ORDER — MORPHINE SULFATE ER 30 MG PO TBCR
30.0000 mg | EXTENDED_RELEASE_TABLET | Freq: Once | ORAL | Status: AC
  Administered 2012-08-26: 30 mg via ORAL
  Filled 2012-08-26: qty 1

## 2012-08-26 MED ORDER — MORPHINE SULFATE 15 MG PO TABS
15.0000 mg | ORAL_TABLET | ORAL | Status: DC | PRN
  Administered 2012-08-26 (×2): 15 mg via ORAL
  Filled 2012-08-26 (×2): qty 1

## 2012-08-26 MED ORDER — FOLIC ACID 1 MG PO TABS
1.0000 mg | ORAL_TABLET | Freq: Every day | ORAL | Status: DC
  Administered 2012-08-26 – 2012-08-27 (×2): 1 mg via ORAL
  Filled 2012-08-26 (×2): qty 1

## 2012-08-26 MED ORDER — SENNA 8.6 MG PO TABS
1.0000 | ORAL_TABLET | Freq: Two times a day (BID) | ORAL | Status: DC
  Administered 2012-08-26: 8.6 mg via ORAL
  Filled 2012-08-26 (×2): qty 1

## 2012-08-26 MED ORDER — MORPHINE SULFATE 15 MG PO TABS
30.0000 mg | ORAL_TABLET | Freq: Once | ORAL | Status: AC
  Administered 2012-08-26: 30 mg via ORAL
  Filled 2012-08-26: qty 2

## 2012-08-26 MED ORDER — MORPHINE SULFATE ER 30 MG PO TBCR
30.0000 mg | EXTENDED_RELEASE_TABLET | Freq: Two times a day (BID) | ORAL | Status: DC
  Administered 2012-08-26: 30 mg via ORAL
  Filled 2012-08-26 (×2): qty 1

## 2012-08-26 MED ORDER — ONDANSETRON HCL 4 MG/2ML IJ SOLN: 4.0000 mg | Freq: Four times a day (QID) | INTRAMUSCULAR | Status: DC | PRN

## 2012-08-26 MED ORDER — SENNOSIDES-DOCUSATE SODIUM 8.6-50 MG PO TABS
1.0000 | ORAL_TABLET | Freq: Two times a day (BID) | ORAL | Status: DC
  Administered 2012-08-26 – 2012-08-27 (×2): 1 via ORAL
  Filled 2012-08-26 (×3): qty 1

## 2012-08-26 MED ORDER — DIPHENHYDRAMINE HCL 25 MG PO CAPS: 50.0000 mg | ORAL_CAPSULE | Freq: Three times a day (TID) | ORAL | Status: DC | PRN

## 2012-08-26 MED ORDER — MORPHINE SULFATE 15 MG PO TABS
45.0000 mg | ORAL_TABLET | Freq: Once | ORAL | Status: AC
  Administered 2012-08-26: 45 mg via ORAL
  Filled 2012-08-26: qty 3

## 2012-08-26 MED ORDER — MORPHINE SULFATE ER 30 MG PO TBCR
90.0000 mg | EXTENDED_RELEASE_TABLET | Freq: Two times a day (BID) | ORAL | Status: DC
  Administered 2012-08-26: 90 mg via ORAL
  Filled 2012-08-26: qty 3

## 2012-08-26 MED ORDER — ENOXAPARIN SODIUM 40 MG/0.4ML ~~LOC~~ SOLN
40.0000 mg | Freq: Every day | SUBCUTANEOUS | Status: DC
  Administered 2012-08-26 – 2012-08-27 (×2): 40 mg via SUBCUTANEOUS
  Filled 2012-08-26 (×2): qty 0.4

## 2012-08-26 MED ORDER — SODIUM CHLORIDE 0.9 % IV SOLN
INTRAVENOUS | Status: DC
  Administered 2012-08-26 – 2012-08-27 (×3): via INTRAVENOUS

## 2012-08-26 MED ORDER — HYDROMORPHONE HCL PF 2 MG/ML IJ SOLN
2.0000 mg | Freq: Once | INTRAMUSCULAR | Status: AC
  Administered 2012-08-26: 2 mg via INTRAVENOUS
  Filled 2012-08-26: qty 1

## 2012-08-26 NOTE — Progress Notes (Signed)
Utilization review completed.  

## 2012-08-26 NOTE — Progress Notes (Signed)
IV RN notified of patient's porta cath needing blood draw this am. Pharaoh Pio Joselita,RN

## 2012-08-26 NOTE — Progress Notes (Signed)
Received patient from ED,admitted for low back pain,sickle cell crisis.Patient is alert and oriented x4.Left porta cath with IVF infusing well.Patient oriented to unit routines.Plan of care,pain management discussed with patient.Will continue to monitor. Karesha Trzcinski Joselita,RN

## 2012-08-26 NOTE — H&P (Signed)
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  Care has been at [facility] and [facility] but he is now in [place] and has appointment with Dr. [name] for next month.  Adm for severe skeletal pain.  This is a re-admission 2 days after a similar admission.  Had been discharged on hydromorphone, set at 4mg  q 4hours.  He developed worsening left leg pain and took 30 tabs in 48 hours.  Reports miserable pain at admission.  Plain film of left femur shows a probable infarct in area of pain.  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  His history includes only pain crises.  Last transfusion was > 5 years ago.  Afrbrile.  O2 = 100%. Normal exam. Hgb = 7.7 Creatinine normal  Visited patient and reviewed opiate therapy with residents.  Suggest morphine in slow-release and immediate-release forms.  Begin with dosing similar to his latest hydromorphone equivalent and observe benefit for at least 1 day.  May be able to discharge him in 1 or more days on oral opiates.  He has a certain diagnosis and reports that his pain often abates completely for 1-2 weeks at a time.

## 2012-08-26 NOTE — H&P (Signed)
Hospital Admission Note Date: 08/26/2012  Patient name: Dennis Bernard Medical record number: [id number] Date of birth: 10/04/1985 Age: 26 y.o. Gender: male PCP: DEFAULT,PROVIDER, MD  Medical Service: Internal Medicine Teaching Service  Attending physician:  Dr. Aundria Rud   1st Contact:  Dr. Zada Girt  [phone] 2nd Contact:  Dr. [name]  Pager:[phone] After 5 pm or weekends: 1st Contact:      Pager: [phone] 2nd Contact:      Pager: [phone]  Chief Complaint: Left thigh pain and lower back pain  History of Present Illness: Dennis Bernard is a 26 year old man with history of sickle cell disease who presented to the [facility] ED with complaints of persistent left thigh and low back from sickle cell crisis. He was admitted to the [facility] for management of these symptoms and discharged on 12/10 with Dilaudid 4mg  q4h PRN for pain and follow up with Dr. [name] at the sickle cell clinic. Dennis Bernard was on a Dilaudid PCA for initial pain control and was slowly transitioned to Dilaudid PO. He states that he feels like he was taken off the PCA too soon although he did report good pain control with PO pain medications on the day of his discharge from the hospital. He reports that for the past two days the pain has been progressively worsened. He has had subjective fever. He has been drinking plenty of fluids and does not feel dehydrated. The pain in his left thigh and lower back are typical of his sickle cell crisis but he states that it is unusual for his crises to last for more than one week .   He denies chest pain, shortness of breath, abdominal pain, diarrhea, focal weakness, numbness, headache, or vision changes. He does not have frequent sickle cell crisis. His last blood transfusion was about 2 years before. He has a Port-A-Cath that was placed 4 months ago.    He went to [facility] today as he recently learned that they have a 24/7 sickle cell team but he was transferred to the [facility] for continuity of care with the  IMTS.    Meds: Current Outpatient Rx  Name  Route  Sig  Dispense  Refill  . DIPHENHYDRAMINE HCL 25 MG PO TABS   Oral   Take 2 tablets (50 mg total) by mouth every 8 (eight) hours as needed for itching.   15 tablet   0   . FOLIC ACID 1 MG PO TABS   Oral   Take 1 mg by mouth daily.         Marland Kitchen HYDROMORPHONE HCL 4 MG PO TABS   Oral   Take 1 tablet (4 mg total) by mouth every 4 (four) hours.   30 tablet   0   . HYDROXYUREA 500 MG PO CAPS   Oral   Take 2,000 mg by mouth daily. May take with food to minimize GI side effects.         Marland Kitchen PROMETHAZINE HCL 12.5 MG PO TABS   Oral   Take 12.5 mg by mouth every 6 (six) hours as needed. Nausea           Allergies: Allergies as of 08/25/2012  . (No Known Allergies)   Past Medical History  Diagnosis Date  . Sickle cell disease    Past Surgical History  Procedure Date  . Cholecystectomy    Family History  Problem Relation Age of Onset  . Sickle cell anemia Brother   . Sickle cell trait Father   .  Sickle cell trait Mother   . Sickle cell anemia Paternal Uncle    History   Social History  . Marital Status: Single    Spouse Name: N/A    Number of Children: N/A  . Years of Education: N/A   Occupational History  . Not on file.   Social History Main Topics  . Smoking status: Never Smoker   . Smokeless tobacco: Never Used  . Alcohol Use: No  . Drug Use: No  . Sexually Active:    Other Topics Concern  . 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    Review of Systems: Pertinent items are noted in HPI.  Physical Exam: Blood pressure 111/72, pulse 77, temperature 98.5 F (36.9 C), temperature source Oral, resp. rate 16, SpO2 100.00%. BP 111/72  Pulse 77  Temp 98.5 F (36.9 C) (Oral)  Resp 16  SpO2 100%  General Appearance:    Alert, cooperative, no distress, appears stated age      Eyes:    PERRL, conjunctiva/corneas clear         Nose:   Nares normal, septum midline, mucosa normal, no drainage    or sinus tenderness  Throat:   Lips, mucosa, and tongue normal, MMM; teeth and gums normal  Neck:   Supple, symmetrical, trachea midline, no adenopathy;         Back:     Symmetric, no curvature, ROM normal, no CVA tenderness. TTP over lumbar spine below L1  Lungs:     Clear to auscultation bilaterally, respirations unlabored  Chest wall:    No tenderness or deformity  Heart:    Regular rate and rhythm, S1 and S2 normal, no murmur, rub   or gallop  Abdomen:     Soft, non-tender, bowel sounds active all four quadrants,    no masses, no organomegaly        Extremities:   Extremities normal, atraumatic, no cyanosis or edema. Left thigh TTP with no edema, erythema, or mass.  Pulses:   2+ and symmetric all extremities  Skin:   Skin color, texture, turgor normal, no rashes or lesions     Neurologic:   CNII-XII intact. Normal strength, sensation and reflexes      throughout    Lab results: Basic Metabolic Panel:  Basename 08/25/12 1750 08/23/12 0344  NA 142 141  K 3.5 3.9  CL 104 107  CO2 25 27  GLUCOSE 97 96  BUN 14 9  CREATININE 0.70 0.54  CALCIUM 9.8 9.0  MG -- --  PHOS -- --   CBC:  Basename 08/25/12 1750 08/23/12 0344  WBC 10.6* 9.2  NEUTROABS 7.2 2.8  HGB 8.9* 7.6*  HCT 25.8* 21.7*  MCV 97.7 97.7  PLT 308 241   Anemia Panel:  Basename 08/25/12 1750  VITAMINB12 --  FOLATE --  FERRITIN --  TIBC --  IRON --  RETICCTPCT 8.4*   Urinalysis:  Basename 08/25/12 2008  COLORURINE YELLOW  LABSPEC 1.015  PHURINE 5.5  GLUCOSEU NEGATIVE  HGBUR NEGATIVE  BILIRUBINUR NEGATIVE  KETONESUR NEGATIVE  PROTEINUR NEGATIVE  UROBILINOGEN 1.0  NITRITE NEGATIVE  LEUKOCYTESUR NEGATIVE    Imaging results:  Xray Left femur: Pending Xray Lumbar spine 4 view: Pending  Assessment & Plan by Problem: 1) Acute sickle cell pain crisis: Patient with acute  crisis of his sickle cell disease. Leg and back  pain secondary to crisis but have now persisted for over one week, concerning for possible osteomyelitis. 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 He denies cough but has subjective fever.  Hg stable at 8.9.  - Admit to Med Surg.  -CXR pending - Pain management with MS Contin 30mg  q12hr - Oxycodone 5 mg q4hr PRN - Hydration with NS 125 cc per hour.  - Recheck CBC in AM  - Anti-emetics for symptom control.  - No indication for transfusion at this time   2) Elevated anion gap: Anion gap of 13. Bicarbonate 25.  - Check lactic acid.   3) Possible narcotic seeking behavior. During his previous admission here at [facility] the patient's PCA pump was found to have been tampered with 40mg  of Dilaudid IV missing. PCA tubing with large bubble at soft part of tubing and dilaudid bag empty, settings on pump reverified and noted to be correct, pt awake, talking, A&Ox3. He was "locked out" of his PCA pump.  Per his record at [facility] with discharge on 07/25/12: "On the morning of discharge there was concern that he was tampering with his PCA machine as the flow rate on the NS was increased to 999 (up from 30) when evaluated by the nurse. The patient stated he did this to see if the higher flow rate would affect his lower extremity edema.Marland KitchenMarland KitchenGiven concerns for tampering the PCA was discontinued immediately and removed from his room. He was subsequently restarted on his oral dilaudid per his outpatient dose and discharged that same day. He was not provided with any additional narcotic scripts at dc. The appropriate people were notified of the concern with PCA tampering."  4) DVT prophylaxis: Lovenox   Dispo: Disposition is deferred at this time, awaiting improvement of current medical problems. Anticipated discharge in approximately 1-2  day(s).   The patient does not have a current PCP (DEFAULT,PROVIDER, MD), therefore will be requiring OPC follow-up after discharge. He does have follow up with his new Hematologist/Oncologist, Dr. Willey Blade on 09/20/12.   The patient does have transportation limitations that hinder transportation to clinic appointments.  Signed: Sara Chu D 08/26/2012, 2:07 AM

## 2012-08-26 NOTE — Progress Notes (Signed)
Unable to obtain blood cultures, pt is a hard stick. MD, Dr. Zada Girt, notified and aware. OK to not do at this time.

## 2012-08-27 LAB — BASIC METABOLIC PANEL
BUN: 7 mg/dL (ref 6–23)
CO2: 24 mEq/L (ref 19–32)
GFR calc non Af Amer: >90 mL/min (ref >90)
Glucose, Bld: 96 mg/dL (ref 70–99)
Potassium: 3.6 mEq/L (ref 3.5–5.1)
Sodium: 144 mEq/L (ref 135–145)

## 2012-08-27 MED ORDER — MORPHINE SULFATE 15 MG PO TABS: 45.0000 mg | ORAL_TABLET | ORAL | Status: DC | PRN

## 2012-08-27 MED ORDER — SENNOSIDES-DOCUSATE SODIUM 8.6-50 MG PO TABS: 1.0000 | ORAL_TABLET | Freq: Two times a day (BID) | ORAL | Status: DC | PRN

## 2012-08-27 MED ORDER — HEPARIN SOD (PORK) LOCK FLUSH 100 UNIT/ML IV SOLN
500.0000 [IU] | INTRAVENOUS | Status: DC | PRN
  Administered 2012-08-27: 500 [IU]
  Filled 2012-08-27: qty 5

## 2012-08-27 MED ORDER — MORPHINE SULFATE ER 30 MG PO TBCR
120.0000 mg | EXTENDED_RELEASE_TABLET | Freq: Two times a day (BID) | ORAL | Status: DC
  Administered 2012-08-27: 120 mg via ORAL
  Filled 2012-08-27: qty 4

## 2012-08-27 MED ORDER — HEPARIN SOD (PORK) LOCK FLUSH 100 UNIT/ML IV SOLN
500.0000 [IU] | INTRAVENOUS | Status: DC
  Filled 2012-08-27: qty 5

## 2012-08-27 MED ORDER — MORPHINE SULFATE ER 60 MG PO TBCR: 120.0000 mg | EXTENDED_RELEASE_TABLET | Freq: Two times a day (BID) | ORAL | Status: DC

## 2012-08-27 NOTE — Discharge Summary (Signed)
Internal Medicine Teaching [facility] Discharge Note  Name: Dennis Bernard MRN: [id number] DOB: 06-Dec-1985 26 y.o.  Date of Admission: 08/25/2012  4:52 PM Date of Discharge: 08/27/2012 Attending Physician: Ulyess Mort, MD  Discharge Diagnosis: 1) Sickle cell pain crisis 2) Sickle cell disease  Discharge Medications:   Medication List     As of 08/27/2012 10:28 AM    STOP taking these medications         HYDROmorphone 4 MG tablet   Commonly known as: DILAUDID      TAKE these medications         diphenhydrAMINE 25 MG tablet   Commonly known as: BENADRYL   Take 2 tablets (50 mg total) by mouth every 8 (eight) hours as needed for itching.      folic acid 1 MG tablet   Commonly known as: FOLVITE   Take 1 mg by mouth daily.      hydroxyurea 500 MG capsule   Commonly known as: HYDREA   Take 2,000 mg by mouth daily. May take with food to minimize GI side effects.      morphine 60 MG 12 hr tablet   Commonly known as: MS CONTIN   Take 2 tablets (120 mg total) by mouth 2 (two) times daily.      morphine 15 MG tablet   Commonly known as: MSIR   Take 3 tablets (45 mg total) by mouth every 4 (four) hours as needed for pain.      promethazine 12.5 MG tablet   Commonly known as: PHENERGAN   Take 12.5 mg by mouth every 6 (six) hours as needed. Nausea      senna-docusate 8.6-50 MG per tablet   Commonly known as: Senokot-S   Take 1 tablet by mouth 2 (two) times daily as needed for constipation.        Disposition and follow-up:   Dennis Bernard was discharged from [facility] in stable and improved condition, with improvement in pain.  The patient will follow-up with Dr. Willey Blade to establish care for sickle cell disease on 09/20/12 at 2:00 pm.  Follow-up Appointments: Follow-up Information    Follow up with [name], [name], MD. On 09/20/2012. (2:00 pm)    Contact information:   [address] [place] [address] [phone]               Discharge Orders    Future Orders Please Complete By Expires   Diet - low sodium heart healthy      Increase activity slowly      Call MD for:  severe uncontrolled pain      Call MD for:  temperature >100.4      Call MD for:  persistant nausea and vomiting         Consultations: None  Procedures Performed:  X-ray Chest Pa And Lateral   08/26/2012  *RADIOLOGY REPORT*  Clinical Data: Low back and left leg pain after sickle cell crisis.  CHEST - 2 VIEW  Comparison: 08/16/2012  Findings: Shallow inspiration.  Normal heart size and pulmonary vascularity.  Increased interstitial markings may be related to sickle cell.  No focal consolidation.  No blunting of costophrenic angles.  No pneumothorax.  Central venous catheter remains unchanged in position.  Postoperative changes in the left shoulder. No significant change since previous study.  IMPRESSION: Shallow inspiration.  No evidence of active pulmonary disease.   Original Report Authenticated By: Burman Nieves, M.D.    Dg Chest  2 View  08/16/2012  *RADIOLOGY REPORT*  Clinical Data: Chest pain, sickle cell crisis  CHEST - 2 VIEW  Comparison: None.  Findings: Cardiomediastinal silhouette is unremarkable.  There is left IJ central line with tip in SVC right atrium junction.  No acute infiltrate or pulmonary edema.  Bony thorax is unremarkable. No diagnostic pneumothorax.  IMPRESSION: No acute infiltrate or pulmonary edema.  Left IJ central line with tip in SVC right atrium junction.  No diagnostic pneumothorax.   Original Report Authenticated By: Natasha Mead, M.D.    Dg Lumbar Spine Complete  08/26/2012  *RADIOLOGY REPORT*  Clinical Data: Low back and leg pain.  Sickle cell crisis.  LUMBAR SPINE - COMPLETE 4+ VIEW  Comparison: None.  Findings: Five lumbar type vertebral bodies.  Mild endplate sclerosis and depression compatible with sickle cell changes. Normal alignment of the lumbar spine.  No vertebral bone lesion or destruction.   Incidental note of sclerosis suggested in the right femoral head which could represent bone necrosis.  IMPRESSION: Sickle cell changes in the lumbar spine.  No displaced fractures. Possible necrosis of the right femoral head.   Original Report Authenticated By: Burman Nieves, M.D.    Dg Femur Left  08/26/2012  *RADIOLOGY REPORT*  Clinical Data: Low back and leg pain.  Sickle cell crisis.  LEFT FEMUR - 2 VIEW  Comparison: None.  Findings: Mild noncircumscribed sclerosis in the distal shaft and distal metaphyseal region of the left femur.  Changes may represent bone infarcts.  No evidence of acute fracture or subluxation.  No abnormal cortical erosion or expansile lesion.  No radiopaque soft tissue foreign bodies.  IMPRESSION: Suggestion of sclerosis in the distal femoral shaft and metaphyseal region compatible with early bone infarct change.   Original Report Authenticated By: Burman Nieves, M.D.     Admission HPI:  Dennis Bernard is a 26 year old man with history of sickle cell disease who presented to the [facility] ED with complaints of persistent left thigh and low back from sickle cell crisis. He was admitted to the [facility] for management of these symptoms and discharged on 12/10 with Dilaudid 4mg  q4h PRN for pain and follow up with Dr. [name] at the sickle cell clinic. Dennis Bernard was on a Dilaudid PCA for initial pain control and was slowly transitioned to Dilaudid PO. He states that he feels like he was taken off the PCA too soon although he did report good pain control with PO pain medications on the day of his discharge from the hospital. He reports that for the past two days the pain has been progressively worsened. He has had subjective fever. He has been drinking plenty of fluids and does not feel dehydrated. The pain in his left thigh and lower back are typical of his sickle cell crisis but he states that it is unusual for his crises to last for more than one week .  He denies chest pain, shortness of  breath, abdominal pain, diarrhea, focal weakness, numbness, headache, or vision changes. He does not have frequent sickle cell crisis. His last blood transfusion was about 2 years before. He has a Port-A-Cath that was placed 4 months ago.  He went to [facility] today as he recently learned that they have a 24/7 sickle cell team but he was transferred to the [facility] for continuity of care with the IMTS.   Admission Physical Exam Blood pressure 111/72, pulse 77, temperature 98.5 F (36.9 C), temperature source Oral, resp. rate 16, SpO2 100.00%.  BP 111/72  Pulse 77  Temp 98.5 F (36.9 C) (Oral)  Resp 16  SpO2 100%  General Appearance:  Alert, cooperative, no distress, appears stated age      Eyes:  PERRL, conjunctiva/corneas clear      Nose:  Nares normal, septum midline, mucosa normal, no drainage or sinus tenderness   Throat:  Lips, mucosa, and tongue normal, MMM; teeth and gums normal   Neck:  Supple, symmetrical, trachea midline, no adenopathy;   Back:  Symmetric, no curvature, ROM normal, no CVA tenderness. TTP over lumbar spine below L1   Lungs:  Clear to auscultation bilaterally, respirations unlabored   Chest wall:  No tenderness or deformity   Heart:  Regular rate and rhythm, S1 and S2 normal, no murmur, rub or gallop   Abdomen:  Soft, non-tender, bowel sounds active all four quadrants,  no masses, no organomegaly         Extremities:  Extremities normal, atraumatic, no cyanosis or edema. Left thigh TTP with no edema, erythema, or mass.   Pulses:  2+ and symmetric all extremities   Skin:  Skin color, texture, turgor normal, no rashes or lesions      Neurologic:  CNII-XII intact. Normal strength, sensation and reflexes  throughout     Admission Labs Basic Metabolic Panel:   [facility]  08/25/12 1750  08/23/12 0344   NA  142  141   K  3.5  3.9   CL  104  107   CO2  25  27   GLUCOSE  97  96   BUN  14  9   CREATININE  0.70  0.54   CALCIUM  9.8  9.0   MG  --  --   PHOS  --  --     CBC:   Basename  08/25/12 1750  08/23/12 0344   WBC  10.6*  9.2   NEUTROABS  7.2  2.8   HGB  8.9*  7.6*   HCT  25.8*  21.7*   MCV  97.7  97.7   PLT  308  241     Hospital Course by problem list: 1) Sickle Cell Pain Crisis - the patient was recently hospitalized 12/3-12/10 for an acute sickle cell pain crisis.  That hospital course was notable for difficult to control pain, requiring a dilaudid PCA.  The patient re-presented 12/12 with residual pain in a similar distribution, mostly lower back and left leg, uncontrolled by his home PO medications.  The patient was started on MS contin with MSIR for breakthrough pain.  The patient's pain was eventually controlled with MS contin 120 mg BID, and MSIR 45 mg q4hrs prn.  The patient was discharged on this pain regimen, with instructions to decrease his usage in accordance with his pain level.  The patient notes that his typical sickle cell pain crises last about 1 week, and occur about once/month.  The patient will follow-up with the Sickle Cell clinic in [place] to establish care.  Hydroxyurea was restarted at discharge given his improving absolute neutrophil count, but was instructed to restart at 1/2 his normal dose for 1 week, then resume his previous dose of 2000 mg daily.  Time spent on discharge: 45 minutes  Discharge Vitals:  BP 124/82  Pulse 95  Temp 97.5 F (36.4 C) (Oral)  Resp 14  Ht 5\' 9"  (1.753 m)  Wt 174 lb 6.1 oz (79.1 kg)  BMI 25.75 kg/m2  SpO2 100%  Discharge Labs:  Results  for orders placed during the hospital encounter of 08/25/12 (from the past 24 hour(s))  BASIC METABOLIC PANEL     Status: Normal   Collection Time   08/27/12  5:16 AM      Component Value Range   Sodium 144  135 - 145 mEq/L   Potassium 3.6  3.5 - 5.1 mEq/L   Chloride 111  96 - 112 mEq/L   CO2 24  19 - 32 mEq/L   Glucose, Bld 96  70 - 99 mg/dL   BUN 7  6 - 23 mg/dL   Creatinine, Ser 1.61  0.50 - 1.35 mg/dL   Calcium 8.8  8.4 - 09.6 mg/dL    GFR calc non Af Amer >90  >90 mL/min   GFR calc Af Amer >90  >90 mL/min    Signed: Janalyn Harder 08/27/2012, 10:28 AM

## 2012-08-27 NOTE — Progress Notes (Signed)
Subjective: Patient feels much better this morning.  Walking around room, appears comfortable.  Wants to go home.  Objective: Vital signs in last 24 hours: Filed Vitals:   08/26/12 1400 08/26/12 1804 08/26/12 2227 08/27/12 0436  BP: 112/88 109/72 114/80 124/82  Pulse: 97 99 92 95  Temp: 98.9 F (37.2 C) 98.3 F (36.8 C) 97.7 F (36.5 C) 97.5 F (36.4 C)  TempSrc: Oral Oral Oral Oral  Resp: 18 18 20 14   Height:      Weight:   174 lb 6.1 oz (79.1 kg)   SpO2: 100% 100% 100% 100%   Weight change: -1 lb 8.3 oz (-0.688 kg)  Intake/Output Summary (Last 24 hours) at 08/27/12 1024 Last data filed at 08/27/12 0700  Gross per 24 hour  Intake   2940 ml  Output   2700 ml  Net    240 ml   PEX General: alert, cooperative, and in no apparent distress HEENT: pupils equal round and reactive to light, vision grossly intact, oropharynx clear and non-erythematous  Neck: supple, no lymphadenopathy Lungs: clear to ascultation bilaterally, normal work of respiration, no wheezes, rales, ronchi Heart: regular rate and rhythm, no murmurs, gallops, or rubs Abdomen: soft, non-tender, non-distended, normal bowel sounds Extremities: no cyanosis, clubbing, or edema Neurologic: alert & oriented X3, cranial nerves II-XII intact, strength grossly intact, sensation intact to light touch  Lab Results: Basic Metabolic Panel:  Lab 08/27/12 4010 08/26/12 0400  NA 144 141  K 3.6 3.5  CL 111 108  CO2 24 24  GLUCOSE 96 97  BUN 7 10  CREATININE 0.75 0.62  CALCIUM 8.8 8.8  MG -- --  PHOS -- --   CBC:  Lab 08/26/12 0400 08/25/12 1750  WBC 9.8 10.6*  NEUTROABS 5.1 7.2  HGB 7.7* 8.9*  HCT 22.4* 25.8*  MCV 97.4 97.7  PLT 243 308   Anemia Panel:  Lab 08/25/12 1750  VITAMINB12 --  FOLATE --  FERRITIN --  TIBC --  IRON --  RETICCTPCT 8.4*   Urinalysis:  Lab 08/25/12 2008  COLORURINE YELLOW  LABSPEC 1.015  PHURINE 5.5  GLUCOSEU NEGATIVE  HGBUR NEGATIVE  BILIRUBINUR NEGATIVE  KETONESUR  NEGATIVE  PROTEINUR NEGATIVE  UROBILINOGEN 1.0  NITRITE NEGATIVE  LEUKOCYTESUR NEGATIVE    Micro Results: Recent Results (from the past 240 hour(s))  MRSA PCR SCREENING     Status: Normal   Collection Time   08/18/12  3:31 PM      Component Value Range Status Comment   MRSA by PCR NEGATIVE  NEGATIVE Final    Studies/Results: X-ray Chest Pa And Lateral   08/26/2012  *RADIOLOGY REPORT*  Clinical Data: Low back and left leg pain after sickle cell crisis.  CHEST - 2 VIEW  Comparison: 08/16/2012  Findings: Shallow inspiration.  Normal heart size and pulmonary vascularity.  Increased interstitial markings may be related to sickle cell.  No focal consolidation.  No blunting of costophrenic angles.  No pneumothorax.  Central venous catheter remains unchanged in position.  Postoperative changes in the left shoulder. No significant change since previous study.  IMPRESSION: Shallow inspiration.  No evidence of active pulmonary disease.   Original Report Authenticated By: Burman Nieves, M.D.    Dg Lumbar Spine Complete  08/26/2012  *RADIOLOGY REPORT*  Clinical Data: Low back and leg pain.  Sickle cell crisis.  LUMBAR SPINE - COMPLETE 4+ VIEW  Comparison: None.  Findings: Five lumbar type vertebral bodies.  Mild endplate sclerosis and depression compatible with sickle  cell changes. Normal alignment of the lumbar spine.  No vertebral bone lesion or destruction.  Incidental note of sclerosis suggested in the right femoral head which could represent bone necrosis.  IMPRESSION: Sickle cell changes in the lumbar spine.  No displaced fractures. Possible necrosis of the right femoral head.   Original Report Authenticated By: Burman Nieves, M.D.    Dg Femur Left  08/26/2012  *RADIOLOGY REPORT*  Clinical Data: Low back and leg pain.  Sickle cell crisis.  LEFT FEMUR - 2 VIEW  Comparison: None.  Findings: Mild noncircumscribed sclerosis in the distal shaft and distal metaphyseal region of the left femur.   Changes may represent bone infarcts.  No evidence of acute fracture or subluxation.  No abnormal cortical erosion or expansile lesion.  No radiopaque soft tissue foreign bodies.  IMPRESSION: Suggestion of sclerosis in the distal femoral shaft and metaphyseal region compatible with early bone infarct change.   Original Report Authenticated By: Burman Nieves, M.D.    Medications: I have reviewed the patient's current medications. Scheduled Meds:   . enoxaparin (LOVENOX) injection  40 mg Subcutaneous Daily  . folic acid  1 mg Oral Daily  . morphine  120 mg Oral Q12H  . senna-docusate  1 tablet Oral BID   Continuous Infusions:   . sodium chloride 125 mL/hr at 08/27/12 0210   PRN Meds:.acetaminophen, acetaminophen, diphenhydrAMINE, morphine, ondansetron (ZOFRAN) IV, ondansetron, sodium chloride Assessment/Plan: 26 yo man, history of sickle cell disease, presenting for sickle cell pain crisis.  1) Sickle cell pain crisis: The patient re-presented yesterday for sickle cell pain crisis.  Patient's pain is much better this morning -MS contin 120 BID -MS IR 45 q4hrs prn -restart hydroxyuria at 1/2 dose for 1 week, then resume full dose, given improvement in ANC  Dispo:  Will discharge today  The patient does not have a current PCP (DEFAULT,PROVIDER, MD), therefore is not requiring OPC follow-up after discharge.   The patient does not have transportation limitations that hinder transportation to clinic appointments.  .Services Needed at time of discharge: Y = Yes, Blank = No PT:   OT:   RN:   Equipment:   Other:     LOS: 2 days   Janalyn Harder 08/27/2012, 10:24 AM

## 2012-08-27 NOTE — Progress Notes (Addendum)
Discussed discharge instructions with pt. Pt showed no barriers to discharge. Porta cath deaccessed. Medications discussed. Assessment unchanged from morning. Pt discharged to home with family.

## 2012-08-29 NOTE — ED Provider Notes (Signed)
Medical screening examination/treatment/procedure(s) were performed by non-physician practitioner and as supervising physician I was immediately available for consultation/collaboration.  Shaye Elling, MD 08/29/12 1455 

## 2012-08-29 NOTE — Discharge Summary (Signed)
I agree with the residents notes and plans.

## 2012-08-29 NOTE — ED Provider Notes (Signed)
Medical screening examination/treatment/procedure(s) were performed by non-physician practitioner and as supervising physician I was immediately available for consultation/collaboration.  See my separate note.   Richardean Canal, MD 08/29/12 [phone]

## 2012-09-08 ENCOUNTER — Encounter (HOSPITAL_COMMUNITY): Payer: Self-pay | Admitting: *Deleted

## 2012-09-08 ENCOUNTER — Inpatient Hospital Stay (HOSPITAL_COMMUNITY)
Admission: EM | Admit: 2012-09-08 | Discharge: 2012-09-12 | DRG: 812 | Disposition: A | Discharge status: Home / Self Care | Payer: Medicare Other | Attending: Internal Medicine | Admitting: Internal Medicine

## 2012-09-08 ENCOUNTER — Emergency Department: Payer: Self-pay | Admitting: Emergency Medicine

## 2012-09-08 ENCOUNTER — Emergency Department (HOSPITAL_COMMUNITY): Payer: Medicare Other

## 2012-09-08 DIAGNOSIS — D5700 Hb-SS disease with crisis, unspecified: Principal | ICD-10-CM | POA: Diagnosis present

## 2012-09-08 DIAGNOSIS — D5701 Hb-SS disease with acute chest syndrome: Secondary | ICD-10-CM

## 2012-09-08 DIAGNOSIS — M25539 Pain in unspecified wrist: Secondary | ICD-10-CM | POA: Diagnosis present

## 2012-09-08 DIAGNOSIS — R079 Chest pain, unspecified: Secondary | ICD-10-CM | POA: Diagnosis present

## 2012-09-08 DIAGNOSIS — D72829 Elevated white blood cell count, unspecified: Secondary | ICD-10-CM | POA: Diagnosis present

## 2012-09-08 DIAGNOSIS — G8929 Other chronic pain: Secondary | ICD-10-CM | POA: Diagnosis present

## 2012-09-08 LAB — COMPREHENSIVE METABOLIC PANEL
ALT: 16 U/L (ref 0–53)
AST: 23 U/L (ref 0–37)
Albumin: 4.5 g/dL (ref 3.4–5.0)
Anion Gap: 10 (ref 7–16)
BUN: 11 mg/dL (ref 7–18)
Bilirubin,Total: 1.2 mg/dL — ABNORMAL HIGH (ref 0.2–1.0)
CO2: 24 mEq/L (ref 19–32)
Calcium, Total: 8.8 mg/dL (ref 8.5–10.1)
Calcium: 8.7 mg/dL (ref 8.4–10.5)
Chloride: 108 mmol/L — ABNORMAL HIGH (ref 98–107)
Creatinine, Ser: 0.6 mg/dL (ref 0.50–1.35)
EGFR (African American): >60
GFR calc Af Amer: >90 mL/min (ref >90)
GFR calc non Af Amer: >90 mL/min (ref >90)
Osmolality: 281 (ref 275–301)
Potassium: 3.4 mmol/L — ABNORMAL LOW (ref 3.5–5.1)
SGOT(AST): 31 U/L (ref 15–37)
SGPT (ALT): 27 U/L (ref 12–78)
Sodium: 141 mmol/L (ref 136–145)
Sodium: 137 mEq/L (ref 135–145)
Total Protein: 8 g/dL (ref 6.4–8.2)
Total Protein: 6.9 g/dL (ref 6.0–8.3)

## 2012-09-08 LAB — CBC WITH DIFFERENTIAL/PLATELET
Band Neutrophils: 0 % (ref 0–10)
Bands: 1 %
Basophils Absolute: 0 10*3/uL (ref 0.0–0.1)
Basophils Relative: 0 % (ref 0–1)
Eosinophils Absolute: 0 10*3/uL (ref 0.0–0.7)
Eosinophils Relative: 0 % (ref 0–5)
HCT: 23.4 % — ABNORMAL LOW (ref 39.0–52.0)
Hemoglobin: 8.1 g/dL — ABNORMAL LOW (ref 13.0–17.0)
MCH: 34 pg (ref 26.0–34.0)
MCH: 33.6 pg (ref 26.0–34.0)
MCHC: 34.6 g/dL (ref 30.0–36.0)
MCV: 97.1 fL (ref 78.0–100.0)
Metamyelocytes Relative: 0 %
Monocytes: 4 %
Myelocytes: 0 %
NRBC/100 WBC: 10 /
Neutro Abs: 5.3 10*3/uL (ref 1.7–7.7)
Promyelocytes Absolute: 0 %
RBC: 2.64 10*6/uL — ABNORMAL LOW (ref 4.40–5.90)
RBC: 2.41 MIL/uL — ABNORMAL LOW (ref 4.22–5.81)
Segmented Neutrophils: 65 %
WBC: 9.1 10*3/uL (ref 3.8–10.6)

## 2012-09-08 LAB — LACTATE DEHYDROGENASE: LDH: 321 U/L — ABNORMAL HIGH (ref 85–241)

## 2012-09-08 LAB — RETICULOCYTES
Absolute Retic Count: 0.1605 10*6/uL — ABNORMAL HIGH (ref 0.031–0.129)
RBC.: 2.41 MIL/uL — ABNORMAL LOW (ref 4.22–5.81)
Retic Count, Absolute: 207.3 10*3/uL — ABNORMAL HIGH (ref 19.0–186.0)
Reticulocyte: 6.08 % — ABNORMAL HIGH (ref 0.7–2.5)

## 2012-09-08 MED ORDER — ONDANSETRON HCL 4 MG/2ML IJ SOLN
4.0000 mg | Freq: Once | INTRAMUSCULAR | Status: AC
  Administered 2012-09-08: 4 mg via INTRAVENOUS
  Filled 2012-09-08: qty 2

## 2012-09-08 MED ORDER — ONDANSETRON HCL 4 MG/2ML IJ SOLN
4.0000 mg | Freq: Four times a day (QID) | INTRAMUSCULAR | Status: DC | PRN
  Administered 2012-09-08 – 2012-09-09 (×2): 4 mg via INTRAVENOUS
  Filled 2012-09-08 (×2): qty 2

## 2012-09-08 MED ORDER — HYDROMORPHONE HCL PF 2 MG/ML IJ SOLN
2.0000 mg | Freq: Once | INTRAMUSCULAR | Status: AC
  Administered 2012-09-08: 2 mg via INTRAVENOUS
  Filled 2012-09-08: qty 1

## 2012-09-08 MED ORDER — ONDANSETRON HCL 4 MG PO TABS
4.0000 mg | ORAL_TABLET | Freq: Four times a day (QID) | ORAL | Status: DC | PRN
  Administered 2012-09-10: 4 mg via ORAL
  Filled 2012-09-08: qty 1

## 2012-09-08 MED ORDER — DIPHENHYDRAMINE HCL 25 MG PO TABS: 50.0000 mg | ORAL_TABLET | Freq: Three times a day (TID) | ORAL | Status: DC | PRN

## 2012-09-08 MED ORDER — HYDROMORPHONE HCL PF 1 MG/ML IJ SOLN
1.0000 mg | INTRAMUSCULAR | Status: DC | PRN
  Administered 2012-09-08 – 2012-09-09 (×6): 2 mg via INTRAVENOUS
  Filled 2012-09-08 (×6): qty 2

## 2012-09-08 MED ORDER — DIPHENHYDRAMINE HCL 50 MG PO CAPS
50.0000 mg | ORAL_CAPSULE | Freq: Three times a day (TID) | ORAL | Status: DC | PRN
  Administered 2012-09-08: 50 mg via ORAL
  Filled 2012-09-08: qty 2

## 2012-09-08 MED ORDER — DIPHENHYDRAMINE HCL 50 MG/ML IJ SOLN
25.0000 mg | Freq: Once | INTRAMUSCULAR | Status: AC
  Administered 2012-09-08: 25 mg via INTRAVENOUS
  Filled 2012-09-08: qty 1

## 2012-09-08 MED ORDER — POTASSIUM CHLORIDE CRYS ER 20 MEQ PO TBCR
40.0000 meq | EXTENDED_RELEASE_TABLET | Freq: Every day | ORAL | Status: DC
  Administered 2012-09-09 – 2012-09-12 (×4): 40 meq via ORAL
  Filled 2012-09-08 (×4): qty 2

## 2012-09-08 NOTE — H&P (Signed)
[facility] History and Physical  Branton Einstein ZOX:[id number] DOB: 1986-09-10    PCP:   Default, Provider, MD   Chief Complaint: Sickle cell crisis and chest pain.   HPI: Dennis Bernard is an 26 y.o. male with hx of sickle cell disease and prior crisis, last ACS was one year ago, presents to the ER with several days of back , leg and arm pain for several days.  Today, he also experience chest pain and mild shortness of breath.  Last time he was diagnosed with ACS was about a year ago, treated with antibiotics and IVF with pain meds, but didn't require transfusion.  Evaluation in the ER included a CXR which showed no new density, a Reticulocyte percent of 8, Hb of 8.1 grams and normal WBC.  He has 100 percent oxygen saturation with Ali [place], no fever.  He has normal renal fx, and unremarkable EKG.  He has a K of 3.3.  Hospitialist was asked to admit him for sickle cell crisis with acute chest syndrome.  Rewiew of Systems:  Constitutional: Negative for malaise, fever and chills. No significant weight loss or weight gain Eyes: Negative for eye pain, redness and discharge, diplopia, visual changes, or flashes of light. ENMT: Negative for ear pain, hoarseness, nasal congestion, sinus pressure and sore throat. No headaches; tinnitus, drooling, or problem swallowing. Cardiovascular: Negative for  palpitations, diaphoresis, dyspnea and peripheral edema. ; No orthopnea, PND Respiratory: Negative for cough, hemoptysis,  and stridor. No pleuritic chestpain. Gastrointestinal: Negative for nausea, vomiting, diarrhea, constipation, abdominal pain, melena, blood in stool, hematemesis, jaundice and rectal bleeding.    Genitourinary: Negative for frequency, dysuria, incontinence,flank pain and hematuria; Musculoskeletal: Negative for swelling and trauma.;  Skin: . Negative for pruritus, rash, abrasions, bruising and skin lesion.; ulcerations Neuro: Negative for headache, lightheadedness and neck  stiffness. Negative for weakness, altered level of consciousness , altered mental status, extremity weakness, burning feet, involuntary movement, seizure and syncope.  Psych: negative for anxiety, depression, insomnia, tearfulness, panic attacks, hallucinations, paranoia, suicidal or homicidal ideation     Past Medical History  Diagnosis Date  . Sickle cell disease     Past Surgical History  Procedure Date  . Cholecystectomy     Medications:  HOME MEDS: Prior to Admission medications   Medication Sig Start Date End Date Taking? Authorizing Provider  diphenhydrAMINE (BENADRYL) 25 MG tablet Take 2 tablets (50 mg total) by mouth every 8 (eight) hours as needed for itching. 08/23/12  Yes Dow Adolph, MD  folic acid (FOLVITE) 1 MG tablet Take 1 mg by mouth daily.   Yes Historical Provider, MD  hydroxyurea (HYDREA) 500 MG capsule Take 2,000 mg by mouth daily. May take with food to minimize GI side effects.   Yes Historical Provider, MD  morphine (MS CONTIN) 60 MG 12 hr tablet Take 120 mg by mouth 2 (two) times daily. 08/27/12  Yes Linward Headland, MD  promethazine (PHENERGAN) 12.5 MG tablet Take 12.5 mg by mouth every 6 (six) hours as needed. Nausea   Yes Historical Provider, MD  senna-docusate (SENOKOT-S) 8.6-50 MG per tablet Take 1 tablet by mouth 2 (two) times daily as needed. 08/27/12  Yes Linward Headland, MD  morphine (MSIR) 15 MG tablet Take 3 tablets (45 mg total) by mouth every 4 (four) hours as needed for pain. 08/27/12   Linward Headland, MD     Allergies:  No Known Allergies  Social History:   reports that he has never smoked. He has never  used smokeless tobacco. He reports that he does not drink alcohol or use illicit drugs.  Family History: Family History  Problem Relation Age of Onset  . Sickle cell anemia Brother   . Sickle cell trait Father   . Sickle cell trait Mother   . Sickle cell anemia Paternal Uncle      Physical Exam: Filed Vitals:   09/08/12 1813  BP:  145/83  Pulse: 91  Temp: 99.4 F (37.4 C)  TempSrc: Oral  Resp: 18  Height: 5\' 9"  (1.753 m)  Weight: 74.844 kg (165 lb)  SpO2: 98%   Blood pressure 145/83, pulse 91, temperature 99.4 F (37.4 C), temperature source Oral, resp. rate 18, height 5\' 9"  (1.753 m), weight 74.844 kg (165 lb), SpO2 98.00%.  GEN:  Pleasant  patient lying in the stretcher in no acute distress; cooperative with exam. PSYCH:  alert and oriented x4; does not appear anxious or depressed; affect is appropriate. HEENT: Mucous membranes pink and anicteric; PERRLA; EOM intact; no cervical lymphadenopathy nor thyromegaly or carotid bruit; no JVD; There were no stridor. Neck is very supple. Breasts:: Not examined CHEST WALL: No tenderness CHEST: Normal respiration, crackles on the left field with mild wheezing.  HEART: Regular rate and rhythm.  There are no murmur, rub, or gallops.   BACK: No kyphosis or scoliosis; no CVA tenderness ABDOMEN: soft and non-tender; no masses, no organomegaly, normal abdominal bowel sounds; no pannus; no intertriginous candida. There is no rebound and no distention. Rectal Exam: Not done EXTREMITIES: No bone or joint deformity; age-appropriate arthropathy of the hands and knees; no edema; no ulcerations.  There is no calf tenderness. Genitalia: not examined PULSES: 2+ and symmetric SKIN: Normal hydration no rash or ulceration CNS: Cranial nerves 2-12 grossly intact no focal lateralizing neurologic deficit.  Speech is fluent; uvula elevated with phonation, facial symmetry and tongue midline. DTR are normal bilaterally, cerebella exam is intact, barbinski is negative and strengths are equaled bilaterally.  No sensory loss.   Labs on Admission:  Basic Metabolic Panel:  Lab 09/08/12 6962  NA 137  K 3.3*  CL 104  CO2 24  GLUCOSE 96  BUN 9  CREATININE 0.60  CALCIUM 8.7  MG --  PHOS --   Liver Function Tests:  Lab 09/08/12 1925  AST 23  ALT 16  ALKPHOS 118*  BILITOT 1.0  PROT 6.9   ALBUMIN 3.7   No results found for this basename: LIPASE:5,AMYLASE:5 in the last 168 hours CBC:  Lab 09/08/12 1925  WBC 9.7  NEUTROABS 5.3  HGB 8.1*  HCT 23.4*  MCV 97.1  PLT 475*   CBG: No results found for this basename: GLUCAP:5 in the last 168 hours   Radiological Exams on Admission: Dg Chest 2 View  09/08/2012  *RADIOLOGY REPORT*  Clinical Data: Sickle cell crisis.  Chest pain and cough and fever  CHEST - 2 VIEW  Comparison: 08/26/2012  Findings: Heart size is mildly enlarged.  Port-A-Cath unchanged with the tip at the cavoatrial junction.  Prominent lung markings appear chronic and unchanged.  No superimposed edema or pneumonia. Negative for pleural effusion.  Left shoulder replacement.  IMPRESSION: No acute cardiopulmonary abnormality and no change from the prior study.   Original Report Authenticated By: Janeece Riggers, M.D.     EKG: Independently reviewed. NSR 80, no acute ST-T changes.   Assessment/Plan Present on Admission:  . Chest pain . Sickle cell pain crisis   PLAN:  I think he has sickle cell crisis  with chest pain, but not true acute chest syndrome.  He doesn't have any new radiodensity in his Xray, no elevated WBC, and oxygenating well.  If this is VOC chest syndrome, it would be very early and very mild.  Will admit him to SDU for closer monitoring, give IVF, IV antibiotics with Rocephin and Zithromax, but he would like to hold off on transfusion.  I think it is reasonable at this time, but will have very low threadhold for transfusion.  I will give him nebs but no steroids.  He is stable, full code, and will be admitted to [facility] service.  Thank you for allowing [place] to partake in the care of this nice patient.   Other plans as per orders.  Code Status: FULL Unk Lightning, MD. [facility] Pager [phone] 7pm to 7am.  09/08/2012, 9:12 PM

## 2012-09-08 NOTE — ED Notes (Signed)
Pt comes in from home c/o pain from left leg and lower back. Also, c/o chest discomfort, SOB, wheezing, and coughing. Pt states that he has not taken anything over the counter.

## 2012-09-08 NOTE — ED Provider Notes (Signed)
History     CSN: [id number]  Arrival date & time 09/08/12  1758   First MD Initiated Contact with Patient 09/08/12 1822      Chief Complaint  Patient presents with  . Sickle Cell Pain Crisis    (Consider location/radiation/quality/duration/timing/severity/associated sxs/prior treatment) Patient is a 26 y.o. male presenting with sickle cell pain. The history is provided by the patient.  Sickle Cell Pain Crisis  This is a recurrent problem. The current episode started 2 days ago. The onset was gradual. The problem occurs continuously. The problem has been gradually worsening. The pain is associated with a recent illness. The pain is present in the lower extremities (back and chest). Site of pain is localized in muscle and bone. The pain is similar to prior episodes. The pain is severe. Nothing relieves the symptoms. The symptoms are aggravated by activity, movement and deep breaths. Associated symptoms include chest pain, congestion, back pain, cough and difficulty breathing. Pertinent negatives include no abdominal pain, no dysuria and no sore throat. The fever has been present for 1 to 2 days. Maximum temperature: 100.2 last night. The temperature was taken using an oral thermometer. The cough is productive. Nothing relieves the cough. Nothing worsens the cough. There is no swelling present. Urine output has been normal. He sickle cell type is SS. There is a history of acute chest syndrome. There have been frequent pain crises. There is no history of stroke. He has been treated with hydroxyurea. There were no sick contacts. Recently, medical care has been given at this facility.    Past Medical History  Diagnosis Date  . Sickle cell disease     Past Surgical History  Procedure Date  . Cholecystectomy     Family History  Problem Relation Age of Onset  . Sickle cell anemia Brother   . Sickle cell trait Father   . Sickle cell trait Mother   . Sickle cell anemia Paternal Uncle      History  Substance Use Topics  . Smoking status: Never Smoker   . Smokeless tobacco: Never Used  . Alcohol Use: No      Review of Systems  Constitutional: Positive for fever and chills.  HENT: Positive for congestion. Negative for sore throat.   Respiratory: Positive for cough.   Cardiovascular: Positive for chest pain.  Gastrointestinal: Negative for abdominal pain.  Genitourinary: Negative for dysuria.  Musculoskeletal: Positive for back pain.  All other systems reviewed and are negative.    Allergies  Review of patient's allergies indicates no known allergies.  Home Medications   Current Outpatient Rx  Name  Route  Sig  Dispense  Refill  . DIPHENHYDRAMINE HCL 25 MG PO TABS   Oral   Take 2 tablets (50 mg total) by mouth every 8 (eight) hours as needed for itching.   15 tablet   0   . FOLIC ACID 1 MG PO TABS   Oral   Take 1 mg by mouth daily.         Marland Kitchen HYDROXYUREA 500 MG PO CAPS   Oral   Take 2,000 mg by mouth daily. May take with food to minimize GI side effects.         . MORPHINE SULFATE ER 60 MG PO TBCR   Oral   Take 120 mg by mouth 2 (two) times daily.         Marland Kitchen PROMETHAZINE HCL 12.5 MG PO TABS   Oral   Take 12.5 mg by mouth  every 6 (six) hours as needed. Nausea         . SENNOSIDES-DOCUSATE SODIUM 8.6-50 MG PO TABS   Oral   Take 1 tablet by mouth 2 (two) times daily as needed.         . MORPHINE SULFATE 15 MG PO TABS   Oral   Take 3 tablets (45 mg total) by mouth every 4 (four) hours as needed for pain.   150 tablet   0     BP 145/83  Pulse 91  Temp 99.4 F (37.4 C) (Oral)  Resp 18  Ht 5\' 9"  (1.753 m)  Wt 165 lb (74.844 kg)  BMI 24.37 kg/m2  SpO2 98%  Physical Exam  Nursing note and vitals reviewed. Constitutional: He is oriented to person, place, and time. He appears well-developed and well-nourished. No distress.  HENT:  Head: Normocephalic and atraumatic.  Mouth/Throat: Oropharynx is clear and moist.  Eyes:  Conjunctivae normal and EOM are normal. Pupils are equal, round, and reactive to light.  Neck: Normal range of motion. Neck supple.  Cardiovascular: Normal rate, regular rhythm and intact distal pulses.   No murmur heard. Pulmonary/Chest: Effort normal and breath sounds normal. No respiratory distress. He has no wheezes. He has no rales. He exhibits tenderness.       Anterior chest wall tenderness and left posterior chest tenderness  Abdominal: Soft. He exhibits no distension. There is no tenderness. There is no rebound and no guarding.  Musculoskeletal: Normal range of motion. He exhibits no edema and no tenderness.       Lumbar back: He exhibits tenderness. He exhibits normal range of motion, no bony tenderness and no swelling.       Right upper leg: He exhibits tenderness. He exhibits no bony tenderness, no swelling and no edema.  Neurological: He is alert and oriented to person, place, and time.  Skin: Skin is warm and dry. No rash noted. No erythema.  Psychiatric: He has a normal mood and affect. His behavior is normal.    ED Course  Procedures (including critical care time)   Labs Reviewed  CBC WITH DIFFERENTIAL  COMPREHENSIVE METABOLIC PANEL  RETICULOCYTES   Dg Chest 2 View  09/08/2012  *RADIOLOGY REPORT*  Clinical Data: Sickle cell crisis.  Chest pain and cough and fever  CHEST - 2 VIEW  Comparison: 08/26/2012  Findings: Heart size is mildly enlarged.  Port-A-Cath unchanged with the tip at the cavoatrial junction.  Prominent lung markings appear chronic and unchanged.  No superimposed edema or pneumonia. Negative for pleural effusion.  Left shoulder replacement.  IMPRESSION: No acute cardiopulmonary abnormality and no change from the prior study.   Original Report Authenticated By: Janeece Riggers, M.D.       Date: 09/08/2012  Rate: 96  Rhythm: normal sinus rhythm  QRS Axis: normal  Intervals: normal  ST/T Wave abnormalities: normal  Conduction Disutrbances: none  Narrative  Interpretation: unremarkable      1. Sickle cell pain crisis   2. Acute chest syndrome       MDM   Patient with history of sickle cell who presents today with pain in his leg, back and chest. He states 2 days ago he developed a cough, fever, chest pain and shortness of breath. He states his cough is productive in on exam he has rhonchi in the left lower lobe concerning for possible pneumonia. On 2 L of oxygen he satting 98% with a low grade temperature here of 99 and 1 of  100.2 yesterday.  He does have a history of acute chest syndrome approximately one year ago and states this feels similar. The pain in his leg and back are typical of his chronic sickle cell pain. Patient has been admitted twice this month for leg and back pain however this is the first time he's had chest pain. EKG shows a normal sinus rhythm without abnormalities.  8:22 PM Labs are stable.  CXR clear.  Will admit for possible early acute chest and pain crisis        Gwyneth Sprout, MD 09/08/12 2022

## 2012-09-08 NOTE — ED Notes (Signed)
Unable to draw blood labs off port. Pt reports this is normal and sometimes fluids must be infused before blood can be drawn off port

## 2012-09-08 NOTE — ED Notes (Signed)
Report given to [facility] ICU

## 2012-09-09 DIAGNOSIS — D72829 Elevated white blood cell count, unspecified: Secondary | ICD-10-CM

## 2012-09-09 LAB — COMPREHENSIVE METABOLIC PANEL
ALT: 15 U/L (ref 0–53)
AST: 25 U/L (ref 0–37)
Albumin: 3.5 g/dL (ref 3.5–5.2)
Alkaline Phosphatase: 109 U/L (ref 39–117)
CO2: 23 mEq/L (ref 19–32)
Chloride: 104 mEq/L (ref 96–112)
GFR calc non Af Amer: >90 mL/min (ref >90)
Potassium: 3.4 mEq/L — ABNORMAL LOW (ref 3.5–5.1)
Sodium: 136 mEq/L (ref 135–145)
Total Bilirubin: 0.9 mg/dL (ref 0.3–1.2)

## 2012-09-09 LAB — CBC
HCT: 23.7 % — ABNORMAL LOW (ref 39.0–52.0)
Hemoglobin: 8 g/dL — ABNORMAL LOW (ref 13.0–17.0)
MCH: 33.2 pg (ref 26.0–34.0)
MCHC: 33.8 g/dL (ref 30.0–36.0)
MCV: 98.3 fL (ref 78.0–100.0)
Platelets: 488 K/uL — ABNORMAL HIGH (ref 150–400)
RBC: 2.41 MIL/uL — ABNORMAL LOW (ref 4.22–5.81)
RDW: 16.9 % — ABNORMAL HIGH (ref 11.5–15.5)
WBC: 11 K/uL — ABNORMAL HIGH (ref 4.0–10.5)

## 2012-09-09 LAB — CREATININE, SERUM
Creatinine, Ser: 0.63 mg/dL (ref 0.50–1.35)
GFR calc Af Amer: >90 mL/min
GFR calc non Af Amer: >90 mL/min

## 2012-09-09 LAB — MRSA PCR SCREENING: MRSA by PCR: NEGATIVE

## 2012-09-09 MED ORDER — DEXTROSE 5 % IV SOLN
500.0000 mg | Freq: Every day | INTRAVENOUS | Status: DC
  Administered 2012-09-09: 500 mg via INTRAVENOUS
  Filled 2012-09-09 (×2): qty 500

## 2012-09-09 MED ORDER — NALOXONE HCL 0.4 MG/ML IJ SOLN: 0.4000 mg | INTRAMUSCULAR | Status: DC | PRN

## 2012-09-09 MED ORDER — ALBUTEROL SULFATE (5 MG/ML) 0.5% IN NEBU
2.5000 mg | INHALATION_SOLUTION | Freq: Four times a day (QID) | RESPIRATORY_TRACT | Status: DC
  Administered 2012-09-09 – 2012-09-10 (×7): 2.5 mg via RESPIRATORY_TRACT
  Filled 2012-09-09 (×8): qty 0.5

## 2012-09-09 MED ORDER — ALBUTEROL SULFATE (5 MG/ML) 0.5% IN NEBU: 2.5000 mg | INHALATION_SOLUTION | RESPIRATORY_TRACT | Status: DC | PRN

## 2012-09-09 MED ORDER — HYDROMORPHONE 0.3 MG/ML IV SOLN
INTRAVENOUS | Status: DC
  Administered 2012-09-09: 21:00:00 via INTRAVENOUS
  Administered 2012-09-09: 6.79 mg via INTRAVENOUS
  Administered 2012-09-09 (×2): via INTRAVENOUS
  Administered 2012-09-09: 7.31 mg via INTRAVENOUS
  Administered 2012-09-09: 11.49 mg via INTRAVENOUS
  Administered 2012-09-09: 16:00:00 via INTRAVENOUS
  Administered 2012-09-09: 7.6 mg via INTRAVENOUS
  Administered 2012-09-09 – 2012-09-10 (×2): via INTRAVENOUS
  Administered 2012-09-10: 5 mg via INTRAVENOUS
  Administered 2012-09-10: 14:00:00 via INTRAVENOUS
  Administered 2012-09-10: 9.44 mg via INTRAVENOUS
  Administered 2012-09-10 (×2): via INTRAVENOUS
  Administered 2012-09-10: 12.47 mg via INTRAVENOUS
  Administered 2012-09-10: 8.99 mg via INTRAVENOUS
  Filled 2012-09-09 (×9): qty 25

## 2012-09-09 MED ORDER — SODIUM CHLORIDE 0.9 % IJ SOLN: 9.0000 mL | INTRAMUSCULAR | Status: DC | PRN

## 2012-09-09 MED ORDER — ALTEPLASE 2 MG IJ SOLR
2.0000 mg | Freq: Once | INTRAMUSCULAR | Status: AC
  Administered 2012-09-09: 2 mg
  Filled 2012-09-09 (×2): qty 2

## 2012-09-09 MED ORDER — DEXTROSE 5 % IV SOLN
1.0000 g | Freq: Every day | INTRAVENOUS | Status: DC
  Administered 2012-09-09: 1 g via INTRAVENOUS
  Filled 2012-09-09: qty 10

## 2012-09-09 MED ORDER — DEXTROSE-NACL 5-0.45 % IV SOLN
INTRAVENOUS | Status: DC
  Administered 2012-09-09: 12:00:00 via INTRAVENOUS
  Administered 2012-09-09 – 2012-09-10 (×2): 1000 mL via INTRAVENOUS
  Administered 2012-09-10 – 2012-09-12 (×4): via INTRAVENOUS

## 2012-09-09 MED ORDER — MORPHINE SULFATE ER 30 MG PO TBCR
120.0000 mg | EXTENDED_RELEASE_TABLET | Freq: Two times a day (BID) | ORAL | Status: DC
  Administered 2012-09-09 (×2): 120 mg via ORAL
  Filled 2012-09-09 (×2): qty 4

## 2012-09-09 MED ORDER — HYDROXYUREA 500 MG PO CAPS
2000.0000 mg | ORAL_CAPSULE | Freq: Every day | ORAL | Status: DC
  Administered 2012-09-09 – 2012-09-12 (×4): 2000 mg via ORAL
  Filled 2012-09-09 (×4): qty 4

## 2012-09-09 MED ORDER — SENNOSIDES-DOCUSATE SODIUM 8.6-50 MG PO TABS
1.0000 | ORAL_TABLET | Freq: Two times a day (BID) | ORAL | Status: DC
  Administered 2012-09-09 – 2012-09-12 (×8): 1 via ORAL
  Filled 2012-09-09 (×11): qty 1

## 2012-09-09 MED ORDER — SODIUM CHLORIDE 0.9 % IJ SOLN
3.0000 mL | Freq: Two times a day (BID) | INTRAMUSCULAR | Status: DC
  Administered 2012-09-09 – 2012-09-11 (×4): 3 mL via INTRAVENOUS

## 2012-09-09 MED ORDER — ENOXAPARIN SODIUM 40 MG/0.4ML ~~LOC~~ SOLN
40.0000 mg | Freq: Every day | SUBCUTANEOUS | Status: DC
  Administered 2012-09-09: 40 mg via SUBCUTANEOUS
  Filled 2012-09-09 (×3): qty 0.4

## 2012-09-09 MED ORDER — DEXTROSE-NACL 5-0.9 % IV SOLN
INTRAVENOUS | Status: DC
  Administered 2012-09-09: 125 mL/h via INTRAVENOUS

## 2012-09-09 MED ORDER — FOLIC ACID 1 MG PO TABS
1.0000 mg | ORAL_TABLET | Freq: Every day | ORAL | Status: DC
  Administered 2012-09-09 – 2012-09-12 (×4): 1 mg via ORAL
  Filled 2012-09-09 (×4): qty 1

## 2012-09-09 MED ORDER — BACITRACIN-NEOMYCIN-POLYMYXIN 400-5-5000 EX OINT
TOPICAL_OINTMENT | CUTANEOUS | Status: AC
  Administered 2012-09-09: 1
  Filled 2012-09-09: qty 1

## 2012-09-09 MED ORDER — ALTEPLASE 2 MG IJ SOLR
2.0000 mg | Freq: Once | INTRAMUSCULAR | Status: AC
  Administered 2012-09-09: 2 mg
  Filled 2012-09-09: qty 2

## 2012-09-09 NOTE — Progress Notes (Signed)
[facility] PROGRESS NOTE  Dennis Bernard [id number]:[id number] DOB: 20-Aug-1986 DOA: 09/08/2012 PCP: Default, Provider, MD  Assessment/Plan: Principal Problem:  Sickle cell pain crisis: I will place patient on a Dilaudid PCA and wean accordingly as pain and function improves. Review of records from [facility] shows a baseline Hgb of 7.5-8.0, most recent ferritin levels < 1000 and Hgb F of 21%. Continue IV hydration and add Toradol for adjunctive management of pain. Will also check CRP. Pt had recently been placed on a large dose of MS-Contin which he has taken on a PRN basis for less than 15 days. I will discontinue the MS-Contin as this will increase tolerance and dependence in this patient without addressing his episodic pain.   Chest pain: Associated with VOC   Leukocytosis: No signs of infection overtly. Likely associated with VOC. Will only pursue ID work-up if patient has a fever.   Left Wrist Pain: Chronic.  H/O Asthma: Currently quiescent. Pt started on Rocephin and Azithromycin but no clinical or radiologic signs of infection. Will discontinue Rocephin and Azithromycin.  Code Status: Full Code Family Communication: N/A Disposition Plan: anticipate clinical course of 4-5 days then D/C home.  MATTHEWS,MICHELLE A.  [facility] Pager [phone]. If 8PM-8AM, please contact night-coverage at [email or web address], password [email or web address] 09/09/2012, 3:58 PM  LOS: 1 day   Brief narrative: Dennis Bernard is an 26 y.o. male with hx of sickle cell disease and prior crisis, last ACS was one year ago, presents to the ER with several days of back , leg and arm pain for several days. Today, he also experience chest pain and mild shortness of breath. Last time he was diagnosed with ACS was about a year ago, treated with antibiotics and IVF with pain meds, but didn't require transfusion. Evaluation in the ER included a CXR which showed no new density, a Reticulocyte percent of 8, Hb of 8.1 grams and normal  WBC. He has 100 percent oxygen saturation with [place], no fever. He has normal renal fx, and unremarkable EKG. He has a K of 3.3. Hospitialist was asked to admit him for sickle cell crisis with acute chest syndrome.   Consultants:  none  Procedures:  none  Antibiotics:  Rocephin 12/26>> 12/27  Azithromycin 12/26 >>12/27  HPI/Subjective: Pt presents with VOC with pain localized to chest, back and legs. Pain is described as throbbing and twisting in nature and is rated as 9/10. Pain decreases to 8.5/10 with current regimen, and relief lasts approximately 30 minutes.    Objective: Filed Vitals:   09/09/12 1122 09/09/12 1200 09/09/12 1221 09/09/12 1245  BP:   103/61   Pulse:   74   Temp:  98.2 F (36.8 C)    TempSrc:  Oral    Resp: 12  22 15   Height:      Weight:      SpO2: 99%  99% 99%   Weight change:   Intake/Output Summary (Last 24 hours) at 09/09/12 1558 Last data filed at 09/09/12 1400  Gross per 24 hour  Intake 1756.66 ml  Output   2100 ml  Net -343.34 ml    General: Alert, awake, oriented x3, in no acute distress.  HEENT: [place]/AT PEERL, EOMI, Anicteric Neck: Trachea midline,  no masses, no thyromegal,y no JVD, no carotid bruit OROPHARYNX:  Moist, No exudate/ erythema/lesions.  Heart: Regular rate and rhythm, without murmurs, rubs, gallops. Lungs: Clear to auscultation, no wheezing or rhonchi noted. No increased vocal fremitus resonant to percussion  Abdomen: Soft, nontender, nondistended, positive  bowel sounds, no masses no hepatosplenomegaly noted. Neuro: No focal neurological deficits. DTRs 2+ bilaterally upper and lower extremities. Strength 57functional in bilateral upper and lower extremities. Musculoskeletal: No warm swelling or erythema around joints, no spinal tenderness noted.     Data Reviewed: Basic Metabolic Panel:  Lab 09/09/12 4782 09/09/12 0030 09/08/12 1925  NA 136 -- 137  K 3.4* -- 3.3*  CL 104 -- 104  CO2 23 -- 24  GLUCOSE 93 -- 96  BUN  6 -- 9  CREATININE 0.57 0.63 0.60  CALCIUM 8.6 -- 8.7  MG -- -- --  PHOS -- -- --   Liver Function Tests:  Lab 09/09/12 1000 09/08/12 1925  AST 25 23  ALT 15 16  ALKPHOS 109 118*  BILITOT 0.9 1.0  PROT 6.4 6.9  ALBUMIN 3.5 3.7   No results found for this basename: LIPASE:5,AMYLASE:5 in the last 168 hours No results found for this basename: AMMONIA:5 in the last 168 hours CBC:  Lab 09/09/12 0030 09/08/12 1925  WBC 11.0* 9.7  NEUTROABS -- 5.3  HGB 8.0* 8.1*  HCT 23.7* 23.4*  MCV 98.3 97.1  PLT 488* 475*   Cardiac Enzymes: No results found for this basename: CKTOTAL:5,CKMB:5,CKMBINDEX:5,TROPONINI:5 in the last 168 hours BNP (last 3 results) No results found for this basename: PROBNP:3 in the last 8760 hours CBG: No results found for this basename: GLUCAP:5 in the last 168 hours  Recent Results (from the past 240 hour(s))  MRSA PCR SCREENING     Status: Normal   Collection Time   09/09/12 12:47 AM      Component Value Range Status Comment   MRSA by PCR NEGATIVE  NEGATIVE Final      Studies: Dg Chest 2 View  09/08/2012  *RADIOLOGY REPORT*  Clinical Data: Sickle cell crisis.  Chest pain and cough and fever  CHEST - 2 VIEW  Comparison: 08/26/2012  Findings: Heart size is mildly enlarged.  Port-A-Cath unchanged with the tip at the cavoatrial junction.  Prominent lung markings appear chronic and unchanged.  No superimposed edema or pneumonia. Negative for pleural effusion.  Left shoulder replacement.  IMPRESSION: No acute cardiopulmonary abnormality and no change from the prior study.   Original Report Authenticated By: Janeece Riggers, M.D.    X-ray Chest Pa And Lateral   08/26/2012  *RADIOLOGY REPORT*  Clinical Data: Low back and left leg pain after sickle cell crisis.  CHEST - 2 VIEW  Comparison: 08/16/2012  Findings: Shallow inspiration.  Normal heart size and pulmonary vascularity.  Increased interstitial markings may be related to sickle cell.  No focal consolidation.  No  blunting of costophrenic angles.  No pneumothorax.  Central venous catheter remains unchanged in position.  Postoperative changes in the left shoulder. No significant change since previous study.  IMPRESSION: Shallow inspiration.  No evidence of active pulmonary disease.   Original Report Authenticated By: Burman Nieves, M.D.    Dg Chest 2 View  08/16/2012  *RADIOLOGY REPORT*  Clinical Data: Chest pain, sickle cell crisis  CHEST - 2 VIEW  Comparison: None.  Findings: Cardiomediastinal silhouette is unremarkable.  There is left IJ central line with tip in SVC right atrium junction.  No acute infiltrate or pulmonary edema.  Bony thorax is unremarkable. No diagnostic pneumothorax.  IMPRESSION: No acute infiltrate or pulmonary edema.  Left IJ central line with tip in SVC right atrium junction.  No diagnostic pneumothorax.   Original Report Authenticated By: Natasha Mead, M.D.    Dg Lumbar Spine  Complete  08/26/2012  *RADIOLOGY REPORT*  Clinical Data: Low back and leg pain.  Sickle cell crisis.  LUMBAR SPINE - COMPLETE 4+ VIEW  Comparison: None.  Findings: Five lumbar type vertebral bodies.  Mild endplate sclerosis and depression compatible with sickle cell changes. Normal alignment of the lumbar spine.  No vertebral bone lesion or destruction.  Incidental note of sclerosis suggested in the right femoral head which could represent bone necrosis.  IMPRESSION: Sickle cell changes in the lumbar spine.  No displaced fractures. Possible necrosis of the right femoral head.   Original Report Authenticated By: Burman Nieves, M.D.    Dg Femur Left  08/26/2012  *RADIOLOGY REPORT*  Clinical Data: Low back and leg pain.  Sickle cell crisis.  LEFT FEMUR - 2 VIEW  Comparison: None.  Findings: Mild noncircumscribed sclerosis in the distal shaft and distal metaphyseal region of the left femur.  Changes may represent bone infarcts.  No evidence of acute fracture or subluxation.  No abnormal cortical erosion or expansile  lesion.  No radiopaque soft tissue foreign bodies.  IMPRESSION: Suggestion of sclerosis in the distal femoral shaft and metaphyseal region compatible with early bone infarct change.   Original Report Authenticated By: Burman Nieves, M.D.     Scheduled Meds:   . albuterol  2.5 mg Nebulization Q6H  . azithromycin  500 mg Intravenous QHS  . folic acid  1 mg Oral Daily  . HYDROmorphone PCA 0.3 mg/mL   Intravenous Q4H  . hydroxyurea  2,000 mg Oral Daily  . potassium chloride  40 mEq Oral Daily  . senna-docusate  1 tablet Oral BID  . sodium chloride  3 mL Intravenous Q12H   Continuous Infusions:   . dextrose 5 % and 0.45% NaCl 125 mL/hr at 09/09/12 1138    Principal Problem:  *Sickle cell pain crisis Active Problems:  Chest pain  Leukocytosis

## 2012-09-09 NOTE — Social Work (Signed)
CSWN met with patient in order to assess needs.  Patient identified that he had no immediate needs but will be following up with an outpatient appointment on Jan 7th.  CSWN introduced social work services.  Patient was open and receptive.  CSWN will follow up with patient during his outpatient visit.    CSWN will follow as needed for this hospitalization.  Beverly Sessions MSW, LCSW [phone]

## 2012-09-10 LAB — COMPREHENSIVE METABOLIC PANEL
AST: 19 U/L (ref 0–37)
Albumin: 3.4 g/dL — ABNORMAL LOW (ref 3.5–5.2)
Calcium: 8.7 mg/dL (ref 8.4–10.5)
Chloride: 106 mEq/L (ref 96–112)
Creatinine, Ser: 0.54 mg/dL (ref 0.50–1.35)
Total Protein: 6.2 g/dL (ref 6.0–8.3)

## 2012-09-10 LAB — CBC WITH DIFFERENTIAL/PLATELET
Basophils Absolute: 0 10*3/uL (ref 0.0–0.1)
Basophils Relative: 0 % (ref 0–1)
Eosinophils Relative: 0 % (ref 0–5)
HCT: 24.4 % — ABNORMAL LOW (ref 39.0–52.0)
MCHC: 33.2 g/dL (ref 30.0–36.0)
Monocytes Absolute: 0.7 10*3/uL (ref 0.1–1.0)
Neutro Abs: 6 10*3/uL (ref 1.7–7.7)
RDW: 16.9 % — ABNORMAL HIGH (ref 11.5–15.5)

## 2012-09-10 MED ORDER — HYDROMORPHONE 0.3 MG/ML IV SOLN
INTRAVENOUS | Status: DC
  Administered 2012-09-10: 19:00:00 via INTRAVENOUS
  Administered 2012-09-10: 25 mg via INTRAVENOUS
  Administered 2012-09-10: 14.59 mg via INTRAVENOUS
  Administered 2012-09-10: 16:00:00 via INTRAVENOUS
  Administered 2012-09-11: 13.33 mg via INTRAVENOUS
  Administered 2012-09-11: 25 mg via INTRAVENOUS
  Administered 2012-09-11: 11:00:00 via INTRAVENOUS
  Administered 2012-09-11: 11.12 mg via INTRAVENOUS
  Administered 2012-09-11: 25 mg via INTRAVENOUS
  Filled 2012-09-10 (×6): qty 25

## 2012-09-10 MED ORDER — KETOROLAC TROMETHAMINE 30 MG/ML IJ SOLN
30.0000 mg | Freq: Four times a day (QID) | INTRAMUSCULAR | Status: DC | PRN
  Administered 2012-09-10 – 2012-09-12 (×5): 30 mg via INTRAVENOUS
  Filled 2012-09-10 (×5): qty 1

## 2012-09-10 MED ORDER — BIOTENE DRY MOUTH MT LIQD
15.0000 mL | Freq: Two times a day (BID) | OROMUCOSAL | Status: DC
  Administered 2012-09-10 – 2012-09-12 (×4): 15 mL via OROMUCOSAL

## 2012-09-10 NOTE — Progress Notes (Signed)
[facility] PROGRESS NOTE  Dennis Bernard WUJ:[id number] DOB: October 01, 1985 DOA: 09/08/2012 PCP: Default, Provider, MD  Assessment/Plan: Principal Problem:  Sickle cell pain crisis:Continue on Dilaudid PCA and wean accordingly as pain and function improves. CRP elevated  Continue Toradol for adjunctive management of pain. Pt well hydrated so will D/C IVF.  Pt had recently been placed on a large dose of MS-Contin which he has taken on a PRN basis for less than 15 days. I will discontinue the MS-Contin as this will increase tolerance and dependence in this patient without addressing his episodic pain.   Hgb SS: Review of records from [facility] shows a baseline Hgb of 7.5-8.0, most recent ferritin levels < 1000 and Hgb F of 21%.   Chest pain: Associated with VOC. Pt also exhibiting clinical features of bronchitis. Will add Azithromycin and continue albuterol. No active wheezing but pt does complain of chest tightness. Oxygen saturations okay. No need for steroids at present.   Leukocytosis: No signs of infection overtly. Likely associated with VOC. Will only pursue ID work-up if patient has a fever.   Left Wrist Pain: Chronic.  H/O Asthma: Currently quiescent. Pt started on Rocephin and Azithromycin but no clinical or radiologic signs of infection. Will resume Azithromycin as pt clinically has a bronchitis.  Code Status: Full Code Family Communication: N/A Disposition Plan: anticipate clinical course of 3-4 days then D/C home.  MATTHEWS,MICHELLE A.  Pager [phone]. If 8PM-8AM, please contact night-coverage. 09/10/2012, 2:58 PM  LOS: 2 days   Brief narrative: Dennis Bernard is an 26 y.o. male with hx of sickle cell disease and prior crisis, last ACS was one year ago, presents to the ER with several days of back , leg and arm pain for several days. Today, he also experience chest pain and mild shortness of breath. Last time he was diagnosed with ACS was about a year ago, treated with  antibiotics and IVF with pain meds, but didn't require transfusion. Evaluation in the ER included a CXR which showed no new density, a Reticulocyte percent of 8, Hb of 8.1 grams and normal WBC. He has 100 percent oxygen saturation with [place], no fever. He has normal renal fx, and unremarkable EKG. He has a K of 3.3. Hospitialist was asked to admit him for sickle cell crisis with acute chest syndrome.   Consultants:  none  Procedures:  none  Antibiotics:  Rocephin 12/26>> 12/27  Azithromycin 12/26 >>12/27  HPI/Subjective: Pt continues to complain of pain localized to chest, back and legs. Pain is described as throbbing and twisting in nature and is rated as 8/10. Pain decreases to 7/10 with PCA.    Objective: Filed Vitals:   09/10/12 0843 09/10/12 1005 09/10/12 1249 09/10/12 1336  BP:      Pulse:      Temp:      TempSrc:      Resp:  16 19 16   Height:      Weight:      SpO2: 96% 99% 97% 97%   Weight change: 5.056 kg (11 lb 2.4 oz)  Intake/Output Summary (Last 24 hours) at 09/10/12 1458 Last data filed at 09/10/12 1400  Gross per 24 hour  Intake 3594.7 ml  Output   1850 ml  Net 1744.7 ml    General: Alert, awake, oriented x3, in no acute distress.  HEENT: [place]/AT PEERL, EOMI, Anicteric Neck: Trachea midline,  no masses, no thyromegal,y no JVD, no carotid bruit OROPHARYNX:  Moist, No exudate/ erythema/lesions.  Heart: Regular rate and  rhythm, without murmurs, rubs, gallops. Lungs: Clear to auscultation, no wheezing or rhonchi noted. No increased vocal fremitus resonant to percussion  Abdomen: Soft, nontender, nondistended, positive bowel sounds, no masses no hepatosplenomegaly noted. Neuro: No focal neurological deficits. DTRs 2+ bilaterally upper and lower extremities. Strength 63functional in bilateral upper and lower extremities. Musculoskeletal: No warm swelling or erythema around joints, no spinal tenderness noted.     Data Reviewed: Basic Metabolic Panel:  Lab  09/10/12 0500 09/09/12 1000 09/09/12 0030 09/08/12 1925  NA 138 136 -- 137  K 3.5 3.4* -- 3.3*  CL 106 104 -- 104  CO2 25 23 -- 24  GLUCOSE 120* 93 -- 96  BUN 6 6 -- 9  CREATININE 0.54 0.57 0.63 0.60  CALCIUM 8.7 8.6 -- 8.7  MG -- -- -- --  PHOS -- -- -- --   Liver Function Tests:  Lab 09/10/12 0500 09/09/12 1000 09/08/12 1925  AST 19 25 23   ALT 14 15 16   ALKPHOS 97 109 118*  BILITOT 0.8 0.9 1.0  PROT 6.2 6.4 6.9  ALBUMIN 3.4* 3.5 3.7   No results found for this basename: LIPASE:5,AMYLASE:5 in the last 168 hours No results found for this basename: AMMONIA:5 in the last 168 hours CBC:  Lab 09/10/12 0500 09/09/12 0030 09/08/12 1925  WBC 10.8* 11.0* 9.7  NEUTROABS 6.0 -- 5.3  HGB 8.1* 8.0* 8.1*  HCT 24.4* 23.7* 23.4*  MCV 99.2 98.3 97.1  PLT 560* 488* 475*   Cardiac Enzymes: No results found for this basename: CKTOTAL:5,CKMB:5,CKMBINDEX:5,TROPONINI:5 in the last 168 hours BNP (last 3 results) No results found for this basename: PROBNP:3 in the last 8760 hours CBG: No results found for this basename: GLUCAP:5 in the last 168 hours  Recent Results (from the past 240 hour(s))  MRSA PCR SCREENING     Status: Normal   Collection Time   09/09/12 12:47 AM      Component Value Range Status Comment   MRSA by PCR NEGATIVE  NEGATIVE Final      Studies: Dg Chest 2 View  09/08/2012  *RADIOLOGY REPORT*  Clinical Data: Sickle cell crisis.  Chest pain and cough and fever  CHEST - 2 VIEW  Comparison: 08/26/2012  Findings: Heart size is mildly enlarged.  Port-A-Cath unchanged with the tip at the cavoatrial junction.  Prominent lung markings appear chronic and unchanged.  No superimposed edema or pneumonia. Negative for pleural effusion.  Left shoulder replacement.  IMPRESSION: No acute cardiopulmonary abnormality and no change from the prior study.   Original Report Authenticated By: Janeece Riggers, M.D.    X-ray Chest Pa And Lateral   08/26/2012  *RADIOLOGY REPORT*  Clinical Data:  Low back and left leg pain after sickle cell crisis.  CHEST - 2 VIEW  Comparison: 08/16/2012  Findings: Shallow inspiration.  Normal heart size and pulmonary vascularity.  Increased interstitial markings may be related to sickle cell.  No focal consolidation.  No blunting of costophrenic angles.  No pneumothorax.  Central venous catheter remains unchanged in position.  Postoperative changes in the left shoulder. No significant change since previous study.  IMPRESSION: Shallow inspiration.  No evidence of active pulmonary disease.   Original Report Authenticated By: Burman Nieves, M.D.    Dg Chest 2 View  08/16/2012  *RADIOLOGY REPORT*  Clinical Data: Chest pain, sickle cell crisis  CHEST - 2 VIEW  Comparison: None.  Findings: Cardiomediastinal silhouette is unremarkable.  There is left IJ central line with tip in SVC right atrium  junction.  No acute infiltrate or pulmonary edema.  Bony thorax is unremarkable. No diagnostic pneumothorax.  IMPRESSION: No acute infiltrate or pulmonary edema.  Left IJ central line with tip in SVC right atrium junction.  No diagnostic pneumothorax.   Original Report Authenticated By: Natasha Mead, M.D.    Dg Lumbar Spine Complete  08/26/2012  *RADIOLOGY REPORT*  Clinical Data: Low back and leg pain.  Sickle cell crisis.  LUMBAR SPINE - COMPLETE 4+ VIEW  Comparison: None.  Findings: Five lumbar type vertebral bodies.  Mild endplate sclerosis and depression compatible with sickle cell changes. Normal alignment of the lumbar spine.  No vertebral bone lesion or destruction.  Incidental note of sclerosis suggested in the right femoral head which could represent bone necrosis.  IMPRESSION: Sickle cell changes in the lumbar spine.  No displaced fractures. Possible necrosis of the right femoral head.   Original Report Authenticated By: Burman Nieves, M.D.    Dg Femur Left  08/26/2012  *RADIOLOGY REPORT*  Clinical Data: Low back and leg pain.  Sickle cell crisis.  LEFT FEMUR - 2 VIEW   Comparison: None.  Findings: Mild noncircumscribed sclerosis in the distal shaft and distal metaphyseal region of the left femur.  Changes may represent bone infarcts.  No evidence of acute fracture or subluxation.  No abnormal cortical erosion or expansile lesion.  No radiopaque soft tissue foreign bodies.  IMPRESSION: Suggestion of sclerosis in the distal femoral shaft and metaphyseal region compatible with early bone infarct change.   Original Report Authenticated By: Burman Nieves, M.D.     Scheduled Meds:    . albuterol  2.5 mg Nebulization Q6H  . antiseptic oral rinse  15 mL Mouth Rinse BID  . folic acid  1 mg Oral Daily  . HYDROmorphone PCA 0.3 mg/mL   Intravenous Q4H  . hydroxyurea  2,000 mg Oral Daily  . potassium chloride  40 mEq Oral Daily  . senna-docusate  1 tablet Oral BID  . sodium chloride  3 mL Intravenous Q12H   Continuous Infusions:    . dextrose 5 % and 0.45% NaCl 125 mL/hr at 09/10/12 1337    Principal Problem:  *Sickle cell pain crisis Active Problems:  Chest pain  Leukocytosis

## 2012-09-10 NOTE — Progress Notes (Signed)
09/10/12 1140 Patient refused to wear SCD's. Patient educated on the purpose for SCD's to help with the preventive measure to decrease chances of a blood clot. Patient verbalized understanding.

## 2012-09-11 LAB — CBC WITH DIFFERENTIAL/PLATELET
Eosinophils Relative: 1 % (ref 0–5)
HCT: 25.2 % — ABNORMAL LOW (ref 39.0–52.0)
Hemoglobin: 8.4 g/dL — ABNORMAL LOW (ref 13.0–17.0)
Lymphocytes Relative: 47 % — ABNORMAL HIGH (ref 12–46)
Lymphs Abs: 4.4 10*3/uL — ABNORMAL HIGH (ref 0.7–4.0)
MCV: 98.4 fL (ref 78.0–100.0)
Monocytes Absolute: 0.6 10*3/uL (ref 0.1–1.0)
RBC: 2.56 MIL/uL — ABNORMAL LOW (ref 4.22–5.81)
WBC: 9.2 10*3/uL (ref 4.0–10.5)

## 2012-09-11 MED ORDER — MORPHINE SULFATE 30 MG PO TABS
45.0000 mg | ORAL_TABLET | ORAL | Status: DC | PRN
  Administered 2012-09-11 – 2012-09-12 (×8): 45 mg via ORAL
  Filled 2012-09-11 (×2): qty 1
  Filled 2012-09-11 (×3): qty 3
  Filled 2012-09-11 (×2): qty 1
  Filled 2012-09-11: qty 3

## 2012-09-11 MED ORDER — ALBUTEROL SULFATE HFA 108 (90 BASE) MCG/ACT IN AERS
2.0000 | INHALATION_SPRAY | Freq: Four times a day (QID) | RESPIRATORY_TRACT | Status: DC
  Administered 2012-09-11 – 2012-09-12 (×5): 2 via RESPIRATORY_TRACT
  Filled 2012-09-11: qty 6.7

## 2012-09-11 MED ORDER — MORPHINE SULFATE ER 30 MG PO TBCR
120.0000 mg | EXTENDED_RELEASE_TABLET | Freq: Two times a day (BID) | ORAL | Status: DC
  Administered 2012-09-11 – 2012-09-12 (×3): 120 mg via ORAL
  Filled 2012-09-11 (×3): qty 4

## 2012-09-11 MED ORDER — ALBUTEROL SULFATE (5 MG/ML) 0.5% IN NEBU: 2.5000 mg | INHALATION_SOLUTION | RESPIRATORY_TRACT | Status: DC | PRN

## 2012-09-11 MED ORDER — SODIUM CHLORIDE 0.9 % IJ SOLN
10.0000 mL | Freq: Two times a day (BID) | INTRAMUSCULAR | Status: DC
  Administered 2012-09-11 – 2012-09-12 (×2): 10 mL via INTRAVENOUS

## 2012-09-11 NOTE — Progress Notes (Signed)
[facility] PROGRESS NOTE  Dennis Bernard ZOX:[id number] DOB: October 19, 1985 DOA: 09/08/2012 PCP: Default, Provider, MD  Brief narrative: Dennis Bernard is an 26 y.o. male with hx of sickle cell disease and prior crisis, last ACS was one year ago, presents to the ER with several days of back , leg and arm pain for several days. Today, he also experience chest pain and mild shortness of breath. Last time he was diagnosed with ACS was about a year ago, treated with antibiotics and IVF with pain meds, but didn't require transfusion. Evaluation in the ER included a CXR which showed no new density, a Reticulocyte percent of 8, Hb of 8.1 grams and normal WBC. He has 100 percent oxygen saturation with Luther, no fever. He has normal renal fx, and unremarkable EKG. He has a K of 3.3. Hospitialist was asked to admit him for sickle cell crisis with acute chest syndrome.   Consultants:  none  Procedures:  none  Antibiotics:  Rocephin 12/26>> 12/27  Azithromycin 12/26 >>12/27  HPI/Subjective: Patient feels much better today and feels ready to switch to oral meds as instructed by Dr Ashley Royalty in her sign out notes yesterday. No chest [ain or shortness of breath noted.   Objective: Filed Vitals:   09/11/12 8119 09/11/12 0659 09/11/12 0805 09/11/12 0828  BP: 94/55     Pulse: 66     Temp: 98.7 F (37.1 C)     TempSrc: Oral     Resp: 18 20 20    Height:      Weight:      SpO2: 100% 95% 100% 96%   Weight change:   Intake/Output Summary (Last 24 hours) at 09/11/12 1111 Last data filed at 09/11/12 0700  Gross per 24 hour  Intake   1115 ml  Output   1600 ml  Net   -485 ml    General: Alert, awake, oriented x3, in no acute distress.  HEENT: Caroline/AT PEERL, EOMI, Anicteric Neck: Trachea midline,  no masses, no thyromegal,y no JVD, no carotid bruit OROPHARYNX:  Moist, No exudate/ erythema/lesions.  Heart: Regular rate and rhythm, without murmurs, rubs, gallops. Lungs: Crackles heard  at left lung base otherwise clear, no wheezing or rhonchi noted. No increased vocal fremitus resonant to percussion  Abdomen: Soft, nontender, nondistended, positive bowel sounds, no masses no hepatosplenomegaly noted. Neuro: No focal neurological deficits. DTRs 2+ bilaterally upper and lower extremities. Strength functional in bilateral upper and lower extremities. Musculoskeletal: No warm swelling or erythema around joints, no spinal tenderness noted.   Data Reviewed: Basic Metabolic Panel:  Lab 09/10/12 1478 09/09/12 1000 09/09/12 0030 09/08/12 1925  NA 138 136 -- 137  K 3.5 3.4* -- 3.3*  CL 106 104 -- 104  CO2 25 23 -- 24  GLUCOSE 120* 93 -- 96  BUN 6 6 -- 9  CREATININE 0.54 0.57 0.63 0.60  CALCIUM 8.7 8.6 -- 8.7  MG -- -- -- --  PHOS -- -- -- --   Liver Function Tests:  Lab 09/10/12 0500 09/09/12 1000 09/08/12 1925  AST 19 25 23   ALT 14 15 16   ALKPHOS 97 109 118*  BILITOT 0.8 0.9 1.0  PROT 6.2 6.4 6.9  ALBUMIN 3.4* 3.5 3.7   CBC:  Lab 09/11/12 0539 09/10/12 0500 09/09/12 0030 09/08/12 1925  WBC 9.2 10.8* 11.0* 9.7  NEUTROABS 4.2 6.0 -- 5.3  HGB 8.4* 8.1* 8.0* 8.1*  HCT 25.2* 24.4* 23.7* 23.4*  MCV 98.4 99.2 98.3 97.1  PLT 613* 560* 488* 475*  Recent Results (from the past 240 hour(s))  MRSA PCR SCREENING     Status: Normal   Collection Time   09/09/12 12:47 AM      Component Value Range Status Comment   MRSA by PCR NEGATIVE  NEGATIVE Final      Studies: Dg Chest 2 View  09/08/2012  *RADIOLOGY REPORT*  Clinical Data: Sickle cell crisis.  Chest pain and cough and fever  CHEST - 2 VIEW  Comparison: 08/26/2012  Findings: Heart size is mildly enlarged.  Port-A-Cath unchanged with the tip at the cavoatrial junction.  Prominent lung markings appear chronic and unchanged.  No superimposed edema or pneumonia. Negative for pleural effusion.  Left shoulder replacement.  IMPRESSION: No acute cardiopulmonary abnormality and no change from the prior study.   Original  Report Authenticated By: Janeece Riggers, M.D.    X-ray Chest Pa And Lateral   08/26/2012  *RADIOLOGY REPORT*  Clinical Data: Low back and left leg pain after sickle cell crisis.  CHEST - 2 VIEW  Comparison: 08/16/2012  Findings: Shallow inspiration.  Normal heart size and pulmonary vascularity.  Increased interstitial markings may be related to sickle cell.  No focal consolidation.  No blunting of costophrenic angles.  No pneumothorax.  Central venous catheter remains unchanged in position.  Postoperative changes in the left shoulder. No significant change since previous study.  IMPRESSION: Shallow inspiration.  No evidence of active pulmonary disease.   Original Report Authenticated By: Burman Nieves, M.D.    Dg Chest 2 View  08/16/2012  *RADIOLOGY REPORT*  Clinical Data: Chest pain, sickle cell crisis  CHEST - 2 VIEW  Comparison: None.  Findings: Cardiomediastinal silhouette is unremarkable.  There is left IJ central line with tip in SVC right atrium junction.  No acute infiltrate or pulmonary edema.  Bony thorax is unremarkable. No diagnostic pneumothorax.  IMPRESSION: No acute infiltrate or pulmonary edema.  Left IJ central line with tip in SVC right atrium junction.  No diagnostic pneumothorax.   Original Report Authenticated By: Natasha Mead, M.D.    Dg Lumbar Spine Complete  08/26/2012  *RADIOLOGY REPORT*  Clinical Data: Low back and leg pain.  Sickle cell crisis.  LUMBAR SPINE - COMPLETE 4+ VIEW  Comparison: None.  Findings: Five lumbar type vertebral bodies.  Mild endplate sclerosis and depression compatible with sickle cell changes. Normal alignment of the lumbar spine.  No vertebral bone lesion or destruction.  Incidental note of sclerosis suggested in the right femoral head which could represent bone necrosis.  IMPRESSION: Sickle cell changes in the lumbar spine.  No displaced fractures. Possible necrosis of the right femoral head.   Original Report Authenticated By: Burman Nieves, M.D.    Dg  Femur Left  08/26/2012  *RADIOLOGY REPORT*  Clinical Data: Low back and leg pain.  Sickle cell crisis.  LEFT FEMUR - 2 VIEW  Comparison: None.  Findings: Mild noncircumscribed sclerosis in the distal shaft and distal metaphyseal region of the left femur.  Changes may represent bone infarcts.  No evidence of acute fracture or subluxation.  No abnormal cortical erosion or expansile lesion.  No radiopaque soft tissue foreign bodies.  IMPRESSION: Suggestion of sclerosis in the distal femoral shaft and metaphyseal region compatible with early bone infarct change.   Original Report Authenticated By: Burman Nieves, M.D.     Scheduled Meds:    . albuterol  2 puff Inhalation QID  . antiseptic oral rinse  15 mL Mouth Rinse BID  . folic acid  1 mg  Oral Daily  . HYDROmorphone PCA 0.3 mg/mL   Intravenous Q4H  . hydroxyurea  2,000 mg Oral Daily  . potassium chloride  40 mEq Oral Daily  . senna-docusate  1 tablet Oral BID  . sodium chloride  3 mL Intravenous Q12H   Continuous Infusions:    . dextrose 5 % and 0.45% NaCl 125 mL/hr at 09/11/12 0930    Principal Problem:  *Sickle cell pain crisis Active Problems:  Chest pain  Leukocytosis  Assessment/Plan: Principal Problem: Sickle cell pain crisis: I will change PCA pump to his home doses of pain meds with breakthrough pain medications. Patient is ok with the plan. If he tolerates switching the PCA pump off than we can discharge him tomorrow.   Hgb SS: Review of records from [facility] shows a baseline Hgb of 7.5-8.0, most recent ferritin levels < 1000 and Hgb F of 21%. His Hb continues to be at his baseline.    Chest pain: Associated with VOC. Would continue albuterol. No active wheezing but pt does complain of chest tightness. Oxygen saturations okay. No need for steroids at present.  Leukocytosis: Resolved.No signs of infection overtly. Likely associated with VOC. Will only pursue ID work-up if patient has a fever.  Left Wrist Pain:  Chronic.  H/O Asthma: Currently quiescent.   Code Status: Full Code Family Communication: Plan of care was discussed with the patient Disposition Plan: likely in 1-2 days.  Lars Mage  Pager [phone]. If 8PM-8AM, please contact night-coverage. 09/11/2012, 11:11 AM  LOS: 3 days

## 2012-09-12 MED ORDER — MORPHINE SULFATE 15 MG PO TABS: 45.0000 mg | ORAL_TABLET | ORAL | Status: DC | PRN

## 2012-09-12 MED ORDER — MORPHINE SULFATE ER 60 MG PO TBCR: 120.0000 mg | EXTENDED_RELEASE_TABLET | Freq: Two times a day (BID) | ORAL | Status: DC

## 2012-09-12 MED ORDER — PROMETHAZINE HCL 12.5 MG PO TABS: 12.5000 mg | ORAL_TABLET | Freq: Four times a day (QID) | ORAL | Status: DC | PRN

## 2012-09-12 MED ORDER — ALBUTEROL SULFATE HFA 108 (90 BASE) MCG/ACT IN AERS: 2.0000 | INHALATION_SPRAY | Freq: Four times a day (QID) | RESPIRATORY_TRACT | Status: DC

## 2012-09-12 MED ORDER — BENZONATATE 200 MG PO CAPS: 200.0000 mg | ORAL_CAPSULE | Freq: Three times a day (TID) | ORAL | Status: DC | PRN

## 2012-09-12 NOTE — Progress Notes (Signed)
Pt. Was discharged to home. Instructions and prescriptions were sent home with pt.

## 2012-09-12 NOTE — Care Management Note (Signed)
    Page 1 of 1   09/12/2012     10:46:28 AM   CARE MANAGEMENT NOTE 09/12/2012  Patient:  Dennis Bernard, Dennis Bernard   Account Number:  [id number]  Date Initiated:  09/12/2012  Documentation initiated by:  Lorenda Ishihara  Subjective/Objective Assessment:   26 yo male admitted with SS crisis. PTA lived at home with friends.     Action/Plan:   Home when stable, pain controlled.   Anticipated DC Date:  09/12/2012   Anticipated DC Plan:  HOME/SELF CARE      DC Planning Services  CM consult      Choice offered to / List presented to:             Status of service:  Completed, signed off Medicare Important Message given?   (If response is "NO", the following Medicare IM given date fields will be blank) Date Medicare IM given:   Date Additional Medicare IM given:    Discharge Disposition:  HOME/SELF CARE  Per UR Regulation:  Reviewed for med. necessity/level of care/duration of stay  If discussed at Long Length of Stay Meetings, dates discussed:    Comments:

## 2012-09-12 NOTE — Discharge Summary (Addendum)
Physician Discharge Summary  Dennis Bernard ZOX:[id number] DOB: 1986-05-20 DOA: 09/08/2012  PCP: Willey Blade, MD  Admit date: 09/08/2012 Discharge date: 09/12/2012  Recommendations for Outpatient Follow-up:  1. Given a prescription for MS Contin # 36 and MSIR 15 mg # 60 to get him to his f/u appointment with Dr. [name] on 09/20/12.  Discharge Diagnoses:  Principal Problem:  *Sickle cell pain crisis Active Problems:   Chest pain   Leukocytosis   Discharge Condition: Stable.  Diet recommendation: Regular.  Brief HPI: Mr. Bianchini is a 26 year old man with past medical history of sickle cell anemia who was admitted to the hospital on 09/08/2012 with an acute crisis.  Assessment/Plan: Principal Problem:  *Sickle cell pain crisis  Admitted and initially placed on a PCA pump. Home medications resumed on 09/11/2012. Active Problems:  Chest pain  No evidence of acute chest syndrome. On bronchodilator therapy as needed for chest tightness. Pulse oximetry 97-100%.  Leukocytosis  No sign of infection. Likely inflammation from sickle cell.  Procedures:  None.  Consultations:  None.  Discharge Exam: Filed Vitals:   09/12/12 1058  BP: 105/70  Pulse: 76  Temp: 98.4 F (36.9 C)  Resp: 18   Filed Vitals:   09/12/12 0221 09/12/12 0610 09/12/12 0840 09/12/12 1058  BP: 103/57 93/50  105/70  Pulse: 60 59  76  Temp: 97.6 F (36.4 C) 97.9 F (36.6 C)  98.4 F (36.9 C)  TempSrc: Oral Oral  Oral  Resp: 18 16  18   Height:      Weight:      SpO2: 100% 100% 97% 96%    Gen:  NAD Cardiovascular:  RRR, No M/R/G Respiratory: Lungs CTAB Gastrointestinal: Abdomen soft, NT/ND with normal active bowel sounds. Extremities: No C/E/C   Discharge Instructions      Discharge Orders    Future Orders Please Complete By Expires   Diet general      Increase activity slowly      Call MD for:  temperature >100.4          Medication List     As of 09/12/2012 11:00 AM      TAKE these medications         albuterol 108 (90 BASE) MCG/ACT inhaler   Commonly known as: PROVENTIL HFA;VENTOLIN HFA   Inhale 2 puffs into the lungs 4 (four) times daily.      benzonatate 200 MG capsule   Commonly known as: TESSALON   Take 1 capsule (200 mg total) by mouth 3 (three) times daily as needed for cough.      diphenhydrAMINE 25 MG tablet   Commonly known as: BENADRYL   Take 2 tablets (50 mg total) by mouth every 8 (eight) hours as needed for itching.      folic acid 1 MG tablet   Commonly known as: FOLVITE   Take 1 mg by mouth daily.      hydroxyurea 500 MG capsule   Commonly known as: HYDREA   Take 2,000 mg by mouth daily. May take with food to minimize GI side effects.      morphine 15 MG tablet   Commonly known as: MSIR   Take 3 tablets (45 mg total) by mouth every 4 (four) hours as needed for pain.      morphine 60 MG 12 hr tablet   Commonly known as: MS CONTIN   Take 2 tablets (120 mg total) by mouth 2 (two) times daily.      promethazine 12.5  MG tablet   Commonly known as: PHENERGAN   Take 1 tablet (12.5 mg total) by mouth every 6 (six) hours as needed. Nausea      senna-docusate 8.6-50 MG per tablet   Commonly known as: Senokot-S   Take 1 tablet by mouth 2 (two) times daily as needed.        Follow-up Information    Follow up with [name], [name], MD. (At your scheduled appt on 09/20/12)    Contact information:   [address] [place] [address] [phone]           The results of significant diagnostics from this hospitalization (including imaging, microbiology, ancillary and laboratory) are listed below for reference.    Significant Diagnostic Studies: Dg Chest 2 View  09/08/2012  *RADIOLOGY REPORT*  Clinical Data: Sickle cell crisis.  Chest pain and cough and fever  CHEST - 2 VIEW  Comparison: 08/26/2012  Findings: Heart size is mildly enlarged.  Port-A-Cath unchanged with the tip at the cavoatrial junction.  Prominent lung  markings appear chronic and unchanged.  No superimposed edema or pneumonia. Negative for pleural effusion.  Left shoulder replacement.  IMPRESSION: No acute cardiopulmonary abnormality and no change from the prior study.   Original Report Authenticated By: Janeece Riggers, M.D.     Microbiology: Recent Results (from the past 240 hour(s))  MRSA PCR SCREENING     Status: Normal   Collection Time   09/09/12 12:47 AM      Component Value Range Status Comment   MRSA by PCR NEGATIVE  NEGATIVE Final      Labs: Basic Metabolic Panel:  Lab 09/10/12 9147 09/09/12 1000 09/09/12 0030 09/08/12 1925  NA 138 136 -- 137  K 3.5 3.4* -- 3.3*  CL 106 104 -- 104  CO2 25 23 -- 24  GLUCOSE 120* 93 -- 96  BUN 6 6 -- 9  CREATININE 0.54 0.57 0.63 0.60  CALCIUM 8.7 8.6 -- 8.7  MG -- -- -- --  PHOS -- -- -- --   Liver Function Tests:  Lab 09/10/12 0500 09/09/12 1000 09/08/12 1925  AST 19 25 23   ALT 14 15 16   ALKPHOS 97 109 118*  BILITOT 0.8 0.9 1.0  PROT 6.2 6.4 6.9  ALBUMIN 3.4* 3.5 3.7   CBC:  Lab 09/11/12 0539 09/10/12 0500 09/09/12 0030 09/08/12 1925  WBC 9.2 10.8* 11.0* 9.7  NEUTROABS 4.2 6.0 -- 5.3  HGB 8.4* 8.1* 8.0* 8.1*  HCT 25.2* 24.4* 23.7* 23.4*  MCV 98.4 99.2 98.3 97.1  PLT 613* 560* 488* 475*    Time coordinating discharge: 25 minutes.  Signed:  RAMA,CHRISTINA  Pager [phone] [facility] 09/12/2012, 11:00 AM

## 2012-09-14 ENCOUNTER — Ambulatory Visit: Payer: Self-pay | Admitting: Oncology

## 2012-09-16 ENCOUNTER — Encounter (HOSPITAL_COMMUNITY): Payer: Self-pay | Admitting: Emergency Medicine

## 2012-09-16 ENCOUNTER — Emergency Department (HOSPITAL_COMMUNITY): Payer: Medicare Other

## 2012-09-16 ENCOUNTER — Emergency Department (HOSPITAL_COMMUNITY)
Admission: EM | Admit: 2012-09-16 | Discharge: 2012-09-16 | Disposition: A | Discharge status: Home / Self Care | Payer: Medicare Other | Attending: Emergency Medicine | Admitting: Emergency Medicine

## 2012-09-16 DIAGNOSIS — M2550 Pain in unspecified joint: Secondary | ICD-10-CM | POA: Insufficient documentation

## 2012-09-16 DIAGNOSIS — IMO0001 Reserved for inherently not codable concepts without codable children: Secondary | ICD-10-CM | POA: Insufficient documentation

## 2012-09-16 DIAGNOSIS — M549 Dorsalgia, unspecified: Secondary | ICD-10-CM | POA: Insufficient documentation

## 2012-09-16 DIAGNOSIS — R05 Cough: Secondary | ICD-10-CM | POA: Insufficient documentation

## 2012-09-16 DIAGNOSIS — R059 Cough, unspecified: Secondary | ICD-10-CM | POA: Insufficient documentation

## 2012-09-16 DIAGNOSIS — D5700 Hb-SS disease with crisis, unspecified: Secondary | ICD-10-CM | POA: Insufficient documentation

## 2012-09-16 DIAGNOSIS — Z79899 Other long term (current) drug therapy: Secondary | ICD-10-CM | POA: Insufficient documentation

## 2012-09-16 LAB — COMPREHENSIVE METABOLIC PANEL
ALT: 33 U/L (ref 0–53)
Albumin: 4.3 g/dL (ref 3.5–5.2)
Alkaline Phosphatase: 100 U/L (ref 39–117)
Glucose, Bld: 87 mg/dL (ref 70–99)
Potassium: 3.6 mEq/L (ref 3.5–5.1)
Sodium: 137 mEq/L (ref 135–145)
Total Protein: 7.5 g/dL (ref 6.0–8.3)

## 2012-09-16 LAB — CBC WITH DIFFERENTIAL/PLATELET
Basophils Relative: 0 % (ref 0–1)
Eosinophils Absolute: 0 10*3/uL (ref 0.0–0.7)
Lymphs Abs: 1.7 10*3/uL (ref 0.7–4.0)
MCH: 33.1 pg (ref 26.0–34.0)
Neutro Abs: 7.9 10*3/uL — ABNORMAL HIGH (ref 1.7–7.7)
Neutrophils Relative %: 77 % (ref 43–77)
Platelets: 605 10*3/uL — ABNORMAL HIGH (ref 150–400)
RBC: 2.51 MIL/uL — ABNORMAL LOW (ref 4.22–5.81)
WBC: 10.2 10*3/uL (ref 4.0–10.5)

## 2012-09-16 MED ORDER — DIPHENHYDRAMINE HCL 50 MG/ML IJ SOLN
INTRAMUSCULAR | Status: AC
  Filled 2012-09-16: qty 1

## 2012-09-16 MED ORDER — HYDROMORPHONE HCL PF 2 MG/ML IJ SOLN
2.0000 mg | Freq: Once | INTRAMUSCULAR | Status: AC
  Administered 2012-09-16: 2 mg via INTRAVENOUS
  Filled 2012-09-16: qty 1

## 2012-09-16 MED ORDER — DIPHENHYDRAMINE HCL 50 MG/ML IJ SOLN
12.5000 mg | Freq: Once | INTRAMUSCULAR | Status: AC
  Administered 2012-09-16: 12.5 mg via INTRAVENOUS

## 2012-09-16 MED ORDER — HYDROMORPHONE HCL PF 2 MG/ML IJ SOLN: 2.0000 mg | Freq: Once | INTRAMUSCULAR | Status: DC

## 2012-09-16 MED ORDER — DIPHENHYDRAMINE HCL 50 MG/ML IJ SOLN
12.5000 mg | Freq: Once | INTRAMUSCULAR | Status: AC
  Administered 2012-09-16: 12.5 mg via INTRAVENOUS
  Filled 2012-09-16: qty 1

## 2012-09-16 MED ORDER — ONDANSETRON HCL 4 MG/2ML IJ SOLN
4.0000 mg | Freq: Once | INTRAMUSCULAR | Status: AC
  Administered 2012-09-16: 4 mg via INTRAVENOUS
  Filled 2012-09-16: qty 2

## 2012-09-16 MED ORDER — FENTANYL CITRATE 0.05 MG/ML IJ SOLN
50.0000 ug | Freq: Once | INTRAMUSCULAR | Status: AC
  Administered 2012-09-16: 50 ug via INTRAVENOUS
  Filled 2012-09-16: qty 2

## 2012-09-16 MED ORDER — HEPARIN SOD (PORK) LOCK FLUSH 100 UNIT/ML IV SOLN
INTRAVENOUS | Status: AC
  Administered 2012-09-16: 500 [IU]
  Filled 2012-09-16: qty 5

## 2012-09-16 MED ORDER — HYDROMORPHONE HCL 2 MG PO TABS: 2.0000 mg | ORAL_TABLET | ORAL | Status: DC | PRN

## 2012-09-16 NOTE — ED Notes (Signed)
Patient transported to X-ray 

## 2012-09-16 NOTE — ED Notes (Signed)
Pt presenting to ed with c/o sickle cell crisis onset this morning pt states he called sickle cell center and they said they were full and he needed to present to ED pt states he is having pain in his back and left leg pain. Pt also states he is having heart palpitations when laying down only. Pt denies chest pain, shortness of breath and vomiting. Pt states intermittent nausea.

## 2012-09-16 NOTE — ED Notes (Signed)
Pt acuity increased to 2-Emergent as pt has a Hx of Acute Chest Syndrome r/t SSC.

## 2012-09-16 NOTE — ED Notes (Signed)
Patient is alert and oriented x3.  He was given DC instructions and follow up visit instructions.  Patient gave verbal understanding.  He was DC ambulatory under his own power to home.  V/S stable.  He was not showing any signs of distress on DC 

## 2012-09-16 NOTE — ED Notes (Signed)
Pt has port - RN to obtain labs 

## 2012-09-16 NOTE — ED Provider Notes (Addendum)
History     CSN: [id number]  Arrival date & time 09/16/12  1309   First MD Initiated Contact with Patient 09/16/12 1451      Chief Complaint  Patient presents with  . Sickle Cell Pain Crisis    (Consider location/radiation/quality/duration/timing/severity/associated sxs/prior treatment) HPI Dennis Bernard is a 27 y.o. male who presents with complaint of back pain and pain in left leg. States symptoms began 4 days ago. Feels like sickle cell crisis. States takes morphine at home but it is not helping. Denies fever, chills, malaise, chest pain. Does reports mild cough. Nothing making pain better or worse. Sates called his doctor and sickle cell clinic this morning but they are full, was told to come here.  Past Medical History  Diagnosis Date  . Sickle cell disease     Past Surgical History  Procedure Date  . Cholecystectomy     Family History  Problem Relation Age of Onset  . Sickle cell anemia Brother   . Sickle cell trait Father   . Sickle cell trait Mother   . Sickle cell anemia Paternal Uncle     History  Substance Use Topics  . Smoking status: Never Smoker   . Smokeless tobacco: Never Used  . Alcohol Use: No      Review of Systems  Constitutional: Negative for fever and chills.  HENT: Negative for congestion, sore throat and neck pain.   Respiratory: Positive for cough. Negative for chest tightness, shortness of breath and wheezing.   Cardiovascular: Negative for chest pain, palpitations and leg swelling.  Musculoskeletal: Positive for myalgias, back pain and arthralgias. Negative for joint swelling and gait problem.  Skin: Negative.   Neurological: Negative for dizziness, weakness, numbness and headaches.  Hematological: Negative for adenopathy.    Allergies  Review of patient's allergies indicates no known allergies.  Home Medications   Current Outpatient Rx  Name  Route  Sig  Dispense  Refill  . ALBUTEROL SULFATE HFA 108 (90 BASE) MCG/ACT IN  AERS   Inhalation   Inhale 2 puffs into the lungs 4 (four) times daily.         Marland Kitchen BENZONATATE 200 MG PO CAPS   Oral   Take 1 capsule (200 mg total) by mouth 3 (three) times daily as needed for cough.   20 capsule   0   . DIPHENHYDRAMINE HCL 25 MG PO TABS   Oral   Take 2 tablets (50 mg total) by mouth every 8 (eight) hours as needed for itching.   15 tablet   0   . FOLIC ACID 1 MG PO TABS   Oral   Take 1 mg by mouth daily.         Marland Kitchen HYDROXYUREA 500 MG PO CAPS   Oral   Take 2,000 mg by mouth daily. May take with food to minimize GI side effects.         . MORPHINE SULFATE ER 60 MG PO TBCR   Oral   Take 2 tablets (120 mg total) by mouth 2 (two) times daily.   36 tablet   0   . MORPHINE SULFATE 15 MG PO TABS   Oral   Take 3 tablets (45 mg total) by mouth every 4 (four) hours as needed for pain.   60 tablet   0   . PROMETHAZINE HCL 12.5 MG PO TABS   Oral   Take 1 tablet (12.5 mg total) by mouth every 6 (six) hours as needed. Nausea  30 tablet   0   . SENNOSIDES-DOCUSATE SODIUM 8.6-50 MG PO TABS   Oral   Take 1 tablet by mouth 2 (two) times daily as needed. Constipation           BP 135/81  Pulse 121  Temp 98.9 F (37.2 C) (Oral)  Resp 22  SpO2 100%  Physical Exam  Nursing note and vitals reviewed. Constitutional: He appears well-developed and well-nourished. No distress.  HENT:  Head: Normocephalic.  Eyes: Conjunctivae normal are normal.  Neck: Neck supple.  Cardiovascular: Normal rate, regular rhythm and normal heart sounds.   Pulmonary/Chest: Effort normal and breath sounds normal. No respiratory distress. He has no wheezes. He has no rales.  Abdominal: Soft. Bowel sounds are normal. He exhibits no distension. There is no tenderness. There is no rebound.  Musculoskeletal: He exhibits no edema.       Paravertebral lumbar spine tenderness. Left leg diffuse tenderness. No swelling. No redness  Neurological: He is alert.  Skin: Skin is warm and  dry.    ED Course  Procedures (including critical care time)  Pt with sickle cell crisis, he is afebrile, non toxic appearing. Labs ordered. Fluids and pain medications ordered.   Results for orders placed during the hospital encounter of 09/16/12  RETICULOCYTES      Component Value Range   Retic Ct Pct 8.3 (*) 0.4 - 3.1 %   RBC. 2.51 (*) 4.22 - 5.81 MIL/uL   Retic Count, Manual 208.3 (*) 19.0 - 186.0 K/uL  CBC WITH DIFFERENTIAL      Component Value Range   WBC 10.2  4.0 - 10.5 K/uL   RBC 2.51 (*) 4.22 - 5.81 MIL/uL   Hemoglobin 8.3 (*) 13.0 - 17.0 g/dL   HCT 45.4 (*) 09.8 - 11.9 %   MCV 94.4  78.0 - 100.0 fL   MCH 33.1  26.0 - 34.0 pg   MCHC 35.0  30.0 - 36.0 g/dL   RDW 14.7 (*) 82.9 - 56.2 %   Platelets 605 (*) 150 - 400 K/uL   Neutrophils Relative 77  43 - 77 %   Neutro Abs 7.9 (*) 1.7 - 7.7 K/uL   Lymphocytes Relative 17  12 - 46 %   Lymphs Abs 1.7  0.7 - 4.0 K/uL   Monocytes Relative 6  3 - 12 %   Monocytes Absolute 0.6  0.1 - 1.0 K/uL   Eosinophils Relative 0  0 - 5 %   Eosinophils Absolute 0.0  0.0 - 0.7 K/uL   Basophils Relative 0  0 - 1 %   Basophils Absolute 0.0  0.0 - 0.1 K/uL  COMPREHENSIVE METABOLIC PANEL      Component Value Range   Sodium 137  135 - 145 mEq/L   Potassium 3.6  3.5 - 5.1 mEq/L   Chloride 103  96 - 112 mEq/L   CO2 26  19 - 32 mEq/L   Glucose, Bld 87  70 - 99 mg/dL   BUN 9  6 - 23 mg/dL   Creatinine, Ser 1.30  0.50 - 1.35 mg/dL   Calcium 9.7  8.4 - 86.5 mg/dL   Total Protein 7.5  6.0 - 8.3 g/dL   Albumin 4.3  3.5 - 5.2 g/dL   AST 35  0 - 37 U/L   ALT 33  0 - 53 U/L   Alkaline Phosphatase 100  39 - 117 U/L   Total Bilirubin 1.7 (*) 0.3 - 1.2 mg/dL   GFR calc  non Af Amer >90  >90 mL/min   GFR calc Af Amer >90  >90 mL/min   Dg Chest 2 View  09/16/2012  *RADIOLOGY REPORT*  Clinical Data: Sickle cell crisis, low back pain  CHEST - 2 VIEW  Comparison: 09/08/2012  Findings: The left chest wall port is again noted.  Left shoulder replacement  is also again seen.  The heart and pulmonary vascularity are within normal limits.  No focal infiltrate or effusion is noted.  The upper abdomen is unremarkable.  No acute bony abnormality is seen.  IMPRESSION: No acute abnormality noted.   Original Report Authenticated By: Alcide Clever, M.D.    5:12 PM Pt feeling better after two rounds of Dilaudid 2mg  IV, will try another dose. VS improving.    7:25 PM Pt improved. Pain down to 3/10. He is asking for dilaudid for pain management at home. Pt's [place] controlled substance website consulted which showed that pt did have prescriptions for dilaudid in the past. Pt's VS normal. He has an apt in 3 days with his doctor here. Will d/c home with follow up. Instructed to return if worsening.   Filed Vitals:   09/16/12 1814  BP: 119/74  Pulse: 91  Temp: 99 F (37.2 C)  Resp: 18       1. Sickle cell pain crisis       MDM  Pt with sickle cell disease, here with a crisis. His labs are at his baseline. His VS are normal. Pt was hydrated with IVFs, pain controlled with multiple doses of IV dilaudid, zofran for nausea, benadryl. Pt feeling better, and wants to go home. No signs of acute chest syndrome. He will be discharged home with po dilaudid for just 2 days and follow up next week.         Lottie Mussel, PA 09/16/12 1930  Lottie Mussel, PA 10/02/12 1609

## 2012-09-17 NOTE — ED Provider Notes (Signed)
Medical screening examination/treatment/procedure(s) were performed by non-physician practitioner and as supervising physician I was immediately available for consultation/collaboration. Devoria Albe, MD, Armando Gang   Ward Givens, MD 09/17/12 Marlyne Beards

## 2012-09-18 ENCOUNTER — Inpatient Hospital Stay: Payer: Self-pay | Admitting: Internal Medicine

## 2012-09-18 LAB — RETICULOCYTES
Absolute Retic Count: 0.1969 10*6/uL — ABNORMAL HIGH (ref 0.031–0.129)
Reticulocyte: 7.16 % — ABNORMAL HIGH (ref 0.7–2.5)

## 2012-09-18 LAB — DIFFERENTIAL
Lymphocytes: 29 %
NRBC/100 WBC: 18 /

## 2012-09-18 LAB — CBC
HCT: 27.9 % — ABNORMAL LOW (ref 40.0–52.0)
Platelet: 574 10*3/uL — ABNORMAL HIGH (ref 150–440)
RBC: 2.75 10*6/uL — ABNORMAL LOW (ref 4.40–5.90)
WBC: 8.3 10*3/uL (ref 3.8–10.6)

## 2012-09-18 LAB — URINALYSIS, COMPLETE
Bacteria: NONE SEEN
Bilirubin,UR: NEGATIVE
Blood: NEGATIVE
Glucose,UR: NEGATIVE mg/dL (ref 0–75)
Ketone: NEGATIVE
Leukocyte Esterase: NEGATIVE
Protein: NEGATIVE
Squamous Epithelial: NONE SEEN
WBC UR: <1 /HPF (ref 0–5)

## 2012-09-18 LAB — BASIC METABOLIC PANEL
BUN: 11 mg/dL (ref 7–18)
Calcium, Total: 9.2 mg/dL (ref 8.5–10.1)
Chloride: 107 mmol/L (ref 98–107)
Co2: 24 mmol/L (ref 21–32)
EGFR (Non-African Amer.): >60
Osmolality: 277 (ref 275–301)
Potassium: 3.4 mmol/L — ABNORMAL LOW (ref 3.5–5.1)

## 2012-09-19 LAB — CBC WITH DIFFERENTIAL/PLATELET
Eosinophil: 1 %
HCT: 21.6 % — ABNORMAL LOW (ref 40.0–52.0)
HGB: 7.1 g/dL — ABNORMAL LOW (ref 13.0–18.0)
HGB: 6.9 g/dL — ABNORMAL LOW (ref 13.0–18.0)
MCH: 34 pg (ref 26.0–34.0)
MCHC: 33.3 g/dL (ref 32.0–36.0)
MCHC: 32.1 g/dL (ref 32.0–36.0)
MCV: 103 fL — ABNORMAL HIGH (ref 80–100)
Monocytes: 2 %
NRBC/100 WBC: 19 /
Platelet: 399 10*3/uL (ref 150–440)
RBC: 2.08 10*6/uL — ABNORMAL LOW (ref 4.40–5.90)
RBC: 2.1 10*6/uL — ABNORMAL LOW (ref 4.40–5.90)
RDW: 17.5 % — ABNORMAL HIGH (ref 11.5–14.5)
Segmented Neutrophils: 41 %
Segmented Neutrophils: 34 %
WBC: 11.3 10*3/uL — ABNORMAL HIGH (ref 3.8–10.6)

## 2012-09-19 LAB — TROPONIN I: Troponin-I: <0.02 ng/mL

## 2012-09-19 LAB — BASIC METABOLIC PANEL
Anion Gap: 9 (ref 7–16)
Calcium, Total: 7.7 mg/dL — ABNORMAL LOW (ref 8.5–10.1)
Creatinine: 0.61 mg/dL (ref 0.60–1.30)
EGFR (African American): >60
Potassium: 3.5 mmol/L (ref 3.5–5.1)
Sodium: 143 mmol/L (ref 136–145)

## 2012-09-20 LAB — HEMOGLOBIN: HGB: 7.4 g/dL — ABNORMAL LOW (ref 13.0–18.0)

## 2012-09-20 LAB — RETICULOCYTES
Absolute Retic Count: 0.2242 10*6/uL — ABNORMAL HIGH (ref 0.031–0.129)
Reticulocyte: 10.05 % — ABNORMAL HIGH (ref 0.7–2.5)

## 2012-09-22 ENCOUNTER — Encounter (HOSPITAL_COMMUNITY): Payer: Self-pay | Admitting: Emergency Medicine

## 2012-09-22 ENCOUNTER — Emergency Department (HOSPITAL_COMMUNITY): Payer: Medicare Other

## 2012-09-22 ENCOUNTER — Inpatient Hospital Stay (HOSPITAL_COMMUNITY)
Admission: EM | Admit: 2012-09-22 | Discharge: 2012-09-25 | DRG: 812 | Disposition: A | Discharge status: Home / Self Care | Payer: Medicare Other | Attending: Internal Medicine | Admitting: Internal Medicine

## 2012-09-22 DIAGNOSIS — D5700 Hb-SS disease with crisis, unspecified: Principal | ICD-10-CM | POA: Diagnosis present

## 2012-09-22 DIAGNOSIS — Z79899 Other long term (current) drug therapy: Secondary | ICD-10-CM

## 2012-09-22 DIAGNOSIS — D571 Sickle-cell disease without crisis: Secondary | ICD-10-CM | POA: Diagnosis present

## 2012-09-22 DIAGNOSIS — G894 Chronic pain syndrome: Secondary | ICD-10-CM | POA: Diagnosis present

## 2012-09-22 DIAGNOSIS — E876 Hypokalemia: Secondary | ICD-10-CM | POA: Diagnosis present

## 2012-09-22 HISTORY — DX: Personal history of other endocrine, nutritional and metabolic disease: Z86.39

## 2012-09-22 HISTORY — DX: Chronic pain syndrome: G89.4

## 2012-09-22 HISTORY — DX: Personal history of other diseases of the respiratory system: Z87.09

## 2012-09-22 HISTORY — DX: Idiopathic aseptic necrosis of right femur: M87.051

## 2012-09-22 LAB — CBC WITH DIFFERENTIAL/PLATELET
Basophils Absolute: 0 K/uL (ref 0.0–0.1)
Basophils Relative: 0 % (ref 0–1)
Eosinophils Absolute: 0 K/uL (ref 0.0–0.7)
Eosinophils Relative: 0 % (ref 0–5)
HCT: 25.1 % — ABNORMAL LOW (ref 39.0–52.0)
Hemoglobin: 8.5 g/dL — ABNORMAL LOW (ref 13.0–17.0)
Lymphocytes Relative: 24 % (ref 12–46)
Lymphs Abs: 2.3 K/uL (ref 0.7–4.0)
MCH: 33.6 pg (ref 26.0–34.0)
MCHC: 33.9 g/dL (ref 30.0–36.0)
MCV: 99.2 fL (ref 78.0–100.0)
Monocytes Absolute: 1 K/uL (ref 0.1–1.0)
Monocytes Relative: 10 % (ref 3–12)
Neutro Abs: 6.4 K/uL (ref 1.7–7.7)
Neutrophils Relative %: 66 % (ref 43–77)
Platelets: 400 K/uL (ref 150–400)
RBC: 2.53 MIL/uL — ABNORMAL LOW (ref 4.22–5.81)
RDW: 17.9 % — ABNORMAL HIGH (ref 11.5–15.5)
WBC: 9.7 K/uL (ref 4.0–10.5)

## 2012-09-22 LAB — URINALYSIS, ROUTINE W REFLEX MICROSCOPIC
Glucose, UA: NEGATIVE mg/dL
Leukocytes, UA: NEGATIVE
Nitrite: NEGATIVE
Protein, ur: NEGATIVE mg/dL
pH: 6.5 (ref 5.0–8.0)

## 2012-09-22 LAB — COMPREHENSIVE METABOLIC PANEL
Albumin: 4.3 g/dL (ref 3.5–5.2)
BUN: 15 mg/dL (ref 6–23)
Creatinine, Ser: 0.6 mg/dL (ref 0.50–1.35)
Total Bilirubin: 1 mg/dL (ref 0.3–1.2)
Total Protein: 7.2 g/dL (ref 6.0–8.3)

## 2012-09-22 LAB — RETICULOCYTES: Retic Ct Pct: 9.1 % — ABNORMAL HIGH (ref 0.4–3.1)

## 2012-09-22 MED ORDER — DIPHENHYDRAMINE HCL 50 MG/ML IJ SOLN
25.0000 mg | Freq: Once | INTRAMUSCULAR | Status: AC
  Administered 2012-09-22: 25 mg via INTRAVENOUS
  Filled 2012-09-22: qty 1

## 2012-09-22 MED ORDER — HYDROMORPHONE HCL PF 2 MG/ML IJ SOLN
2.0000 mg | Freq: Once | INTRAMUSCULAR | Status: AC
  Administered 2012-09-22: 2 mg via INTRAVENOUS
  Filled 2012-09-22: qty 1

## 2012-09-22 MED ORDER — SODIUM CHLORIDE 0.9 % IJ SOLN: 9.0000 mL | INTRAMUSCULAR | Status: DC | PRN

## 2012-09-22 MED ORDER — DIPHENHYDRAMINE HCL 12.5 MG/5ML PO ELIX: 12.5000 mg | ORAL_SOLUTION | Freq: Four times a day (QID) | ORAL | Status: DC | PRN

## 2012-09-22 MED ORDER — ALBUTEROL SULFATE HFA 108 (90 BASE) MCG/ACT IN AERS
2.0000 | INHALATION_SPRAY | Freq: Four times a day (QID) | RESPIRATORY_TRACT | Status: DC | PRN
  Filled 2012-09-22: qty 6.7

## 2012-09-22 MED ORDER — SODIUM CHLORIDE 0.9 % IV SOLN
INTRAVENOUS | Status: AC
  Administered 2012-09-22: 14:00:00 via INTRAVENOUS

## 2012-09-22 MED ORDER — HYDROMORPHONE HCL PF 1 MG/ML IJ SOLN
INTRAMUSCULAR | Status: AC
  Administered 2012-09-22: 2 mg
  Filled 2012-09-22: qty 2

## 2012-09-22 MED ORDER — DIPHENHYDRAMINE HCL 25 MG PO TABS
25.0000 mg | ORAL_TABLET | Freq: Three times a day (TID) | ORAL | Status: DC | PRN
  Administered 2012-09-23: 25 mg via ORAL
  Filled 2012-09-22 (×2): qty 1

## 2012-09-22 MED ORDER — SENNOSIDES-DOCUSATE SODIUM 8.6-50 MG PO TABS
1.0000 | ORAL_TABLET | Freq: Every day | ORAL | Status: DC | PRN
  Filled 2012-09-22: qty 1

## 2012-09-22 MED ORDER — ONDANSETRON HCL 4 MG/2ML IJ SOLN: 4.0000 mg | Freq: Four times a day (QID) | INTRAMUSCULAR | Status: DC | PRN

## 2012-09-22 MED ORDER — HYDROMORPHONE 0.3 MG/ML IV SOLN
INTRAVENOUS | Status: DC
  Administered 2012-09-22: 16:00:00 via INTRAVENOUS
  Filled 2012-09-22: qty 25

## 2012-09-22 MED ORDER — FOLIC ACID 1 MG PO TABS
1.0000 mg | ORAL_TABLET | Freq: Every day | ORAL | Status: DC
  Administered 2012-09-22 – 2012-09-25 (×4): 1 mg via ORAL
  Filled 2012-09-22 (×4): qty 1

## 2012-09-22 MED ORDER — SODIUM CHLORIDE 0.9 % IV BOLUS (SEPSIS)
1000.0000 mL | Freq: Once | INTRAVENOUS | Status: AC
  Administered 2012-09-22: 1000 mL via INTRAVENOUS

## 2012-09-22 MED ORDER — MORPHINE SULFATE ER 30 MG PO TBCR
60.0000 mg | EXTENDED_RELEASE_TABLET | Freq: Two times a day (BID) | ORAL | Status: DC
  Administered 2012-09-22 – 2012-09-25 (×7): 60 mg via ORAL
  Filled 2012-09-22 (×7): qty 2

## 2012-09-22 MED ORDER — POTASSIUM CHLORIDE CRYS ER 20 MEQ PO TBCR
20.0000 meq | EXTENDED_RELEASE_TABLET | Freq: Once | ORAL | Status: AC
  Administered 2012-09-22: 20 meq via ORAL
  Filled 2012-09-22: qty 1

## 2012-09-22 MED ORDER — HYDROMORPHONE HCL PF 2 MG/ML IJ SOLN: 2.0000 mg | INTRAMUSCULAR | Status: DC | PRN

## 2012-09-22 MED ORDER — HYDROMORPHONE 0.3 MG/ML IV SOLN
INTRAVENOUS | Status: DC
  Administered 2012-09-22: 9.49 mg via INTRAVENOUS
  Administered 2012-09-22: 0.3 mg via INTRAVENOUS
  Administered 2012-09-22: 12.42 mg via INTRAVENOUS
  Administered 2012-09-22: 23:00:00 via INTRAVENOUS
  Administered 2012-09-23: 0.3 mg via INTRAVENOUS
  Administered 2012-09-23: 6.99 mg via INTRAVENOUS
  Administered 2012-09-23: 09:00:00 via INTRAVENOUS
  Administered 2012-09-23: 6.99 mg via INTRAVENOUS
  Filled 2012-09-22 (×5): qty 25

## 2012-09-22 MED ORDER — HYDROXYUREA 500 MG PO CAPS
2000.0000 mg | ORAL_CAPSULE | Freq: Every day | ORAL | Status: DC
  Administered 2012-09-22 – 2012-09-25 (×4): 2000 mg via ORAL
  Filled 2012-09-22 (×4): qty 4

## 2012-09-22 MED ORDER — SODIUM CHLORIDE 0.9 % IV SOLN
INTRAVENOUS | Status: DC
  Administered 2012-09-22 – 2012-09-24 (×6): via INTRAVENOUS

## 2012-09-22 MED ORDER — NALOXONE HCL 0.4 MG/ML IJ SOLN: 0.4000 mg | INTRAMUSCULAR | Status: DC | PRN

## 2012-09-22 MED ORDER — HYDROMORPHONE HCL PF 1 MG/ML IJ SOLN
1.0000 mg | Freq: Once | INTRAMUSCULAR | Status: AC
  Administered 2012-09-22: 1 mg via INTRAVENOUS
  Filled 2012-09-22: qty 1

## 2012-09-22 MED ORDER — PROMETHAZINE HCL 25 MG PO TABS: 25.0000 mg | ORAL_TABLET | Freq: Four times a day (QID) | ORAL | Status: DC | PRN

## 2012-09-22 MED ORDER — MORPHINE SULFATE 15 MG PO TABS
15.0000 mg | ORAL_TABLET | ORAL | Status: DC | PRN
  Administered 2012-09-24 (×2): 15 mg via ORAL
  Filled 2012-09-22 (×2): qty 1

## 2012-09-22 MED ORDER — DIPHENHYDRAMINE HCL 50 MG/ML IJ SOLN: 12.5000 mg | Freq: Four times a day (QID) | INTRAMUSCULAR | Status: DC | PRN

## 2012-09-22 MED ORDER — HYDROMORPHONE HCL PF 2 MG/ML IJ SOLN
2.0000 mg | Freq: Once | INTRAMUSCULAR | Status: AC
Start: 2012-09-22 — End: 2012-09-22
  Administered 2012-09-22: 2 mg via INTRAVENOUS
  Filled 2012-09-22: qty 1

## 2012-09-22 MED ORDER — ONDANSETRON HCL 4 MG/2ML IJ SOLN
4.0000 mg | Freq: Once | INTRAMUSCULAR | Status: AC
  Administered 2012-09-22: 4 mg via INTRAVENOUS
  Filled 2012-09-22: qty 2

## 2012-09-22 NOTE — ED Notes (Addendum)
Pt states that he normally get Dilaudid 2mg .  Made EDP Belfi aware. No new orders at this time.

## 2012-09-22 NOTE — ED Provider Notes (Addendum)
History     CSN: [id number]  Arrival date & time 09/22/12  0849   First MD Initiated Contact with Patient 09/22/12 [phone]      Chief Complaint  Patient presents with  . Sickle Cell Pain Crisis  . back/neck pain     (Consider location/radiation/quality/duration/timing/severity/associated sxs/prior treatment) HPI Comments: Patient with a history of sickle cell disease presents with pain crises. He has pain in his low back which is typical for his sickle cell flareups but also has pain in neck. He denies any headache or dizziness. He's had some cough and some mild chest congestion but denies any shortness of breath. He had a fever up to 100 yesterday but denies any fevers today. Denies any skin rashes. He denies any abdominal pain, nausea vomiting or diarrhea. He takes morphine at home which did not relieve the pain. He states he called the sickle cell clinic this morning but they were full. He normally sees Dr. [name].  Patient is a 27 y.o. male presenting with sickle cell pain.  Sickle Cell Pain Crisis  Associated symptoms include congestion, back pain, neck pain and cough. Pertinent negatives include no chest pain, no abdominal pain, no diarrhea, no nausea, no vomiting, no hematuria, no headaches, no rhinorrhea, no weakness and no rash.    Past Medical History  Diagnosis Date  . Sickle cell disease     Past Surgical History  Procedure Date  . Cholecystectomy     Family History  Problem Relation Age of Onset  . Sickle cell anemia Brother   . Sickle cell trait Father   . Sickle cell trait Mother   . Sickle cell anemia Paternal Uncle     History  Substance Use Topics  . Smoking status: Never Smoker   . Smokeless tobacco: Never Used  . Alcohol Use: No      Review of Systems  Constitutional: Positive for fever (100). Negative for chills, diaphoresis and fatigue.  HENT: Positive for congestion and neck pain. Negative for rhinorrhea and sneezing.   Eyes: Negative.     Respiratory: Positive for cough. Negative for chest tightness and shortness of breath.   Cardiovascular: Negative for chest pain and leg swelling.  Gastrointestinal: Negative for nausea, vomiting, abdominal pain, diarrhea and blood in stool.  Genitourinary: Negative for frequency, hematuria, flank pain and difficulty urinating.  Musculoskeletal: Positive for back pain. Negative for arthralgias.  Skin: Negative for rash.  Neurological: Negative for dizziness, speech difficulty, weakness, numbness and headaches.    Allergies  Review of patient's allergies indicates no known allergies.  Home Medications   Current Outpatient Rx  Name  Route  Sig  Dispense  Refill  . ALBUTEROL SULFATE HFA 108 (90 BASE) MCG/ACT IN AERS   Inhalation   Inhale 2 puffs into the lungs every 6 (six) hours as needed. wheezing         . DIPHENHYDRAMINE HCL 25 MG PO TABS   Oral   Take 25 mg by mouth every 8 (eight) hours as needed.         Marland Kitchen FOLIC ACID 1 MG PO TABS   Oral   Take 1 mg by mouth daily.         Marland Kitchen HYDROMORPHONE HCL 2 MG PO TABS   Oral   Take 1 tablet (2 mg total) by mouth every 4 (four) hours as needed for pain.   20 tablet   0   . HYDROXYUREA 500 MG PO CAPS   Oral   Take  2,000 mg by mouth daily. May take with food to minimize GI side effects.         . MORPHINE SULFATE ER 60 MG PO TBCR   Oral   Take 60 mg by mouth 2 (two) times daily.         . MORPHINE SULFATE 15 MG PO TABS   Oral   Take 15 mg by mouth every 4 (four) hours as needed. Pain         . PROMETHAZINE HCL 25 MG PO TABS   Oral   Take 25 mg by mouth every 6 (six) hours as needed. Nausea         . SENNOSIDES-DOCUSATE SODIUM 8.6-50 MG PO TABS   Oral   Take 1 tablet by mouth daily as needed. Constipation           BP 131/88  Pulse 105  Temp 98.8 F (37.1 C) (Oral)  Resp 20  SpO2 100%  Physical Exam  Constitutional: He is oriented to person, place, and time. He appears well-developed and  well-nourished.  HENT:  Head: Normocephalic and atraumatic.  Right Ear: External ear normal.  Left Ear: External ear normal.  Mouth/Throat: Oropharynx is clear and moist.  Eyes: Pupils are equal, round, and reactive to light.  Neck: Normal range of motion. Neck supple.       Tenderness along the cervical spine and the lower lumbar spine. There is no tenderness along the thoracic spine. No neck stiffness  Cardiovascular: Normal rate, regular rhythm and normal heart sounds.   Pulmonary/Chest: Effort normal and breath sounds normal. No respiratory distress. He has no wheezes. He has no rales. He exhibits no tenderness.  Abdominal: Soft. Bowel sounds are normal. There is no tenderness. There is no rebound and no guarding.  Musculoskeletal: Normal range of motion. He exhibits no edema.  Lymphadenopathy:    He has no cervical adenopathy.  Neurological: He is alert and oriented to person, place, and time.  Skin: Skin is warm and dry. No rash noted.  Psychiatric: He has a normal mood and affect.    ED Course  Procedures (including critical care time)  Results for orders placed during the hospital encounter of 09/22/12  CBC WITH DIFFERENTIAL      Component Value Range   WBC 9.7  4.0 - 10.5 K/uL   RBC 2.53 (*) 4.22 - 5.81 MIL/uL   Hemoglobin 8.5 (*) 13.0 - 17.0 g/dL   HCT 81.1 (*) 91.4 - 78.2 %   MCV 99.2  78.0 - 100.0 fL   MCH 33.6  26.0 - 34.0 pg   MCHC 33.9  30.0 - 36.0 g/dL   RDW 95.6 (*) 21.3 - 08.6 %   Platelets 400  150 - 400 K/uL   Neutrophils Relative 66  43 - 77 %   Lymphocytes Relative 24  12 - 46 %   Monocytes Relative 10  3 - 12 %   Eosinophils Relative 0  0 - 5 %   Basophils Relative 0  0 - 1 %   Neutro Abs 6.4  1.7 - 7.7 K/uL   Lymphs Abs 2.3  0.7 - 4.0 K/uL   Monocytes Absolute 1.0  0.1 - 1.0 K/uL   Eosinophils Absolute 0.0  0.0 - 0.7 K/uL   Basophils Absolute 0.0  0.0 - 0.1 K/uL   RBC Morphology POLYCHROMASIA PRESENT    COMPREHENSIVE METABOLIC PANEL       Component Value Range   Sodium 140  135 -  145 mEq/L   Potassium 3.3 (*) 3.5 - 5.1 mEq/L   Chloride 106  96 - 112 mEq/L   CO2 25  19 - 32 mEq/L   Glucose, Bld 90  70 - 99 mg/dL   BUN 15  6 - 23 mg/dL   Creatinine, Ser 1.61  0.50 - 1.35 mg/dL   Calcium 9.2  8.4 - 09.6 mg/dL   Total Protein 7.2  6.0 - 8.3 g/dL   Albumin 4.3  3.5 - 5.2 g/dL   AST 19  0 - 37 U/L   ALT 13  0 - 53 U/L   Alkaline Phosphatase 82  39 - 117 U/L   Total Bilirubin 1.0  0.3 - 1.2 mg/dL   GFR calc non Af Amer >90  >90 mL/min   GFR calc Af Amer >90  >90 mL/min  URINALYSIS, ROUTINE W REFLEX MICROSCOPIC      Component Value Range   Color, Urine YELLOW  YELLOW   APPearance CLEAR  CLEAR   Specific Gravity, Urine 1.015  1.005 - 1.030   pH 6.5  5.0 - 8.0   Glucose, UA NEGATIVE  NEGATIVE mg/dL   Hgb urine dipstick NEGATIVE  NEGATIVE   Bilirubin Urine NEGATIVE  NEGATIVE   Ketones, ur NEGATIVE  NEGATIVE mg/dL   Protein, ur NEGATIVE  NEGATIVE mg/dL   Urobilinogen, UA 0.2  0.0 - 1.0 mg/dL   Nitrite NEGATIVE  NEGATIVE   Leukocytes, UA NEGATIVE  NEGATIVE  RETICULOCYTES      Component Value Range   Retic Ct Pct 9.1 (*) 0.4 - 3.1 %   RBC. 2.53 (*) 4.22 - 5.81 MIL/uL   Retic Count, Manual 230.2 (*) 19.0 - 186.0 K/uL   Dg Chest 2 View  09/22/2012  *RADIOLOGY REPORT*  Clinical Data: Sickle cell crisis, shortness of breath, fever, cough, congestion, body aches, history Port-A-Cath, hypertension  CHEST - 2 VIEW  Comparison: 09/16/2012  Findings: Left jugular dual lumen Port-A-Cath unchanged tip projecting over cavoatrial junction. Upper normal heart size. Slight pulmonary vascular congestion. Stable mediastinal contours. Lungs grossly clear. No pleural effusion or pneumothorax. Left shoulder joint replacement.  IMPRESSION: No acute abnormalities.   Original Report Authenticated By: Ulyses Southward, M.D.    Dg Chest 2 View  09/16/2012  *RADIOLOGY REPORT*  Clinical Data: Sickle cell crisis, low back pain  CHEST - 2 VIEW  Comparison:  09/08/2012  Findings: The left chest wall port is again noted.  Left shoulder replacement is also again seen.  The heart and pulmonary vascularity are within normal limits.  No focal infiltrate or effusion is noted.  The upper abdomen is unremarkable.  No acute bony abnormality is seen.  IMPRESSION: No acute abnormality noted.   Original Report Authenticated By: Alcide Clever, M.D.    Dg Chest 2 View  09/08/2012  *RADIOLOGY REPORT*  Clinical Data: Sickle cell crisis.  Chest pain and cough and fever  CHEST - 2 VIEW  Comparison: 08/26/2012  Findings: Heart size is mildly enlarged.  Port-A-Cath unchanged with the tip at the cavoatrial junction.  Prominent lung markings appear chronic and unchanged.  No superimposed edema or pneumonia. Negative for pleural effusion.  Left shoulder replacement.  IMPRESSION: No acute cardiopulmonary abnormality and no change from the prior study.   Original Report Authenticated By: Janeece Riggers, M.D.    X-ray Chest Pa And Lateral   08/26/2012  *RADIOLOGY REPORT*  Clinical Data: Low back and left leg pain after sickle cell crisis.  CHEST - 2 VIEW  Comparison: 08/16/2012  Findings: Shallow inspiration.  Normal heart size and pulmonary vascularity.  Increased interstitial markings may be related to sickle cell.  No focal consolidation.  No blunting of costophrenic angles.  No pneumothorax.  Central venous catheter remains unchanged in position.  Postoperative changes in the left shoulder. No significant change since previous study.  IMPRESSION: Shallow inspiration.  No evidence of active pulmonary disease.   Original Report Authenticated By: Burman Nieves, M.D.    Dg Lumbar Spine Complete  08/26/2012  *RADIOLOGY REPORT*  Clinical Data: Low back and leg pain.  Sickle cell crisis.  LUMBAR SPINE - COMPLETE 4+ VIEW  Comparison: None.  Findings: Five lumbar type vertebral bodies.  Mild endplate sclerosis and depression compatible with sickle cell changes. Normal alignment of the lumbar  spine.  No vertebral bone lesion or destruction.  Incidental note of sclerosis suggested in the right femoral head which could represent bone necrosis.  IMPRESSION: Sickle cell changes in the lumbar spine.  No displaced fractures. Possible necrosis of the right femoral head.   Original Report Authenticated By: Burman Nieves, M.D.    Dg Femur Left  08/26/2012  *RADIOLOGY REPORT*  Clinical Data: Low back and leg pain.  Sickle cell crisis.  LEFT FEMUR - 2 VIEW  Comparison: None.  Findings: Mild noncircumscribed sclerosis in the distal shaft and distal metaphyseal region of the left femur.  Changes may represent bone infarcts.  No evidence of acute fracture or subluxation.  No abnormal cortical erosion or expansile lesion.  No radiopaque soft tissue foreign bodies.  IMPRESSION: Suggestion of sclerosis in the distal femoral shaft and metaphyseal region compatible with early bone infarct change.   Original Report Authenticated By: Burman Nieves, M.D.       1. Sickle cell anemia with pain       MDM  Patient presents with a sickle cell pain crises. His similar to his past flareups. He's been given 3 doses of allotted in the ED and has no improvement of symptoms. I will go ahead and consult hospitalist for admission. He has no evidence of acute chest syndrome. He has nothing to suggest aplastic crisis or acute infection        Rolan Bucco, MD 09/22/12 1136  Rolan Bucco, MD 09/22/12 1136

## 2012-09-22 NOTE — H&P (Signed)
[facility] History and Physical  Dennis Bernard EAV:[id number] DOB: 04-30-1986 DOA: 09/22/2012  Referring physician: ER physician PCP: No primary provider on file.   Chief Complaint: Generalized pain  HPI:  27 year old male with past medical history of sickle cell disease who presents with worsening generalized pain which per patient is his typical sickle cell crisis pain. Patient reports pain being mostly in his lower back, non-radiating, constant and not relieved with home analgesics. Patient reports no complaints of chest pain, he does endorse some cough but no sputum production. No complaints of shortness of breath. Patient did report having fever at home, maximum temperature 100 F.  No complaints of abdominal pain, nausea or vomiting. In ED, patient was given Dilaudid 2 mg repeatedly for total of 4 times but patient continues to complain of uncontrolled pain. His BMET revealed slight hypokalemia at 3.3 and CBC revealed hemoglobin of 8.5. Chest x-ray did not show any acute cardiopulmonary issues.  Assessment and plan:  Principal Problem:  *Sickle cell pain crisis  Continue supportive care with IV fluids, normal saline at 100 cc an hour  Continue current analgesics, Dilaudid 2 mg IV every 2 hours as needed for severe pain. Continue MS Contin 60 mg every 12 hours scheduled.  Continue antiemetics as needed Active Problems:  Sickle cell anemia  Hemoglobin in December 2013 was 8.4 and on this admission 8.5 said this is around patient's baseline  No acute bleed  Continue folic acid and hydroxyurea  Hypokalemia  Repleted in emergency department  Followup BMP in the morning  Code Status: Full Family Communication: Pt at bedside Disposition Plan: Admit for further evaluation  [name], MD  [facility] Pager [phone]  If 7PM-7AM, please contact night-coverage [email or web address] Password [email or web address] 09/22/2012, 1:16 PM   Review of Systems:  Constitutional: Negative for fever,  chills and malaise/fatigue. Negative for diaphoresis.  HENT: Negative for hearing loss, ear pain, nosebleeds, congestion, sore throat, neck pain, tinnitus and ear discharge.   Eyes: Negative for blurred vision, double vision, photophobia, pain, discharge and redness.  Respiratory: Negative for cough, hemoptysis, sputum production, shortness of breath, wheezing and stridor.   Cardiovascular: Negative for chest pain, palpitations, orthopnea, claudication and leg swelling.  Gastrointestinal: Negative for nausea, vomiting and abdominal pain. Negative for heartburn, constipation, blood in stool and melena.  Genitourinary: Negative for dysuria, urgency, frequency, hematuria and flank pain.  Musculoskeletal: per HPI  Skin: Negative for itching and rash.  Neurological: Negative for dizziness and weakness. Negative for tingling, tremors, sensory change, speech change, focal weakness, loss of consciousness and headaches.  Endo/Heme/Allergies: Negative for environmental allergies and polydipsia. Does not bruise/bleed easily.  Psychiatric/Behavioral: Negative for suicidal ideas. The patient is not nervous/anxious.      Past Medical History  Diagnosis Date  . Sickle cell disease   . Avascular necrosis of femur head, right   . H/O allergic rhinitis   . H/O hypokalemia   . Chronic pain syndrome    Past Surgical History  Procedure Date  . Cholecystectomy 1995  . Left shoulder arthroplasty 04/09/2008  . Left pac placement 07/04/2012  . Exchange transfusion 02/2008   Social History:  reports that he has quit smoking. His smoking use included Cigars. He has never used smokeless tobacco. He reports that he does not drink alcohol or use illicit drugs.  No Known Allergies  Family History:  Family History  Problem Relation Age of Onset  . Sickle cell anemia Brother   . Sickle cell trait Father   . Sickle  cell trait Mother   . Diabetes Mellitus II Mother   . Sickle cell anemia Paternal Uncle       Prior to Admission medications   Medication Sig Start Date End Date Taking? Authorizing Provider  albuterol (PROVENTIL HFA;VENTOLIN HFA) 108 (90 BASE) MCG/ACT inhaler Inhale 2 puffs into the lungs every 6 (six) hours as needed. wheezing 09/12/12  Yes Christina P Rama, MD  diphenhydrAMINE (BENADRYL) 25 MG tablet Take 25 mg by mouth every 8 (eight) hours as needed. 08/23/12  Yes Dow Adolph, MD  folic acid (FOLVITE) 1 MG tablet Take 1 mg by mouth daily.   Yes Historical Provider, MD  HYDROmorphone (DILAUDID) 2 MG tablet Take 1 tablet (2 mg total) by mouth every 4 (four) hours as needed for pain. 09/16/12  Yes Tatyana A Kirichenko, PA  hydroxyurea (HYDREA) 500 MG capsule Take 2,000 mg by mouth daily. May take with food to minimize GI side effects.   Yes Historical Provider, MD  morphine (MS CONTIN) 60 MG 12 hr tablet Take 60 mg by mouth 2 (two) times daily. 09/12/12  Yes Christina P Rama, MD  morphine (MSIR) 15 MG tablet Take 15 mg by mouth every 4 (four) hours as needed. Pain 09/12/12  Yes Maryruth Bun Rama, MD  promethazine (PHENERGAN) 25 MG tablet Take 25 mg by mouth every 6 (six) hours as needed. Nausea   Yes Historical Provider, MD  senna-docusate (SENOKOT-S) 8.6-50 MG per tablet Take 1 tablet by mouth daily as needed. Constipation 08/27/12  Yes Linward Headland, MD   Physical Exam: Filed Vitals:   09/22/12 0900 09/22/12 1208 09/22/12 1302  BP: 131/88 112/69 113/62  Pulse: 105 79 83  Temp: 98.8 F (37.1 C) 98.3 F (36.8 C) 98.7 F (37.1 C)  TempSrc: Oral Oral Oral  Resp: 20 19 18   Height:   5\' 9"  (1.753 m)  Weight:   79.833 kg (176 lb)  SpO2: 100% 99% 99%    Physical Exam  Constitutional: Appears well-developed and well-nourished. No distress.  HENT: Normocephalic. External right and left ear normal. Oropharynx is clear and moist.  Eyes: Conjunctivae and EOM are normal. PERRLA, no scleral icterus.  Neck: Normal ROM. Neck supple. No JVD. No tracheal deviation. No thyromegaly.   CVS: RRR, S1/S2 +, no murmurs, no gallops, no carotid bruit.  Pulmonary: Effort and breath sounds normal, no stridor, rhonchi, wheezes, rales.  Abdominal: Soft. BS +,  no distension, tenderness, rebound or guarding.  Musculoskeletal: Normal range of motion. No edema and no tenderness.  Lymphadenopathy: No lymphadenopathy noted, cervical, inguinal. Neuro: Alert. Normal reflexes, muscle tone coordination. No cranial nerve deficit. Skin: Skin is warm and dry. No rash noted. Not diaphoretic. No erythema. No pallor.  Psychiatric: Normal mood and affect. Behavior, judgment, thought content normal.   Labs on Admission:  Basic Metabolic Panel:  Lab 09/22/12 7253 09/16/12 1405  NA 140 137  K 3.3* 3.6  CL 106 103  CO2 25 26  GLUCOSE 90 87  BUN 15 9  CREATININE 0.60 0.58  CALCIUM 9.2 9.7   Liver Function Tests:  Lab 09/22/12 1000 09/16/12 1405  AST 19 35  ALT 13 33  ALKPHOS 82 100  BILITOT 1.0 1.7*  PROT 7.2 7.5  ALBUMIN 4.3 4.3   CBC:  Lab 09/22/12 1000 09/16/12 1405  WBC 9.7 10.2  NEUTROABS 6.4 7.9*  HGB 8.5* 8.3*  HCT 25.1* 23.7*  MCV 99.2 94.4  PLT 400 605*   Radiological Exams on Admission: Dg Chest 2  View 09/22/2012  * IMPRESSION: No acute abnormalities.   Original Report Authenticated By: Ulyses Southward, M.D.     EKG: Normal sinus rhythm, no ST/T wave changes  Time spent: 75 minutes

## 2012-09-22 NOTE — ED Notes (Signed)
Per patient, started having back pain yesterday morning, unable to control with home meds

## 2012-09-22 NOTE — ED Notes (Addendum)
Tried to call report was told that Deanna RN not answering her phone at this time and was given her direct number. Will re-try reaching her by phone [phone].

## 2012-09-22 NOTE — Progress Notes (Addendum)
Pt. Disconnected self from PCA/IV lines to use restroom. Pt. Educated on taking pole with him when ambulating and/or calling for help and not to disconnect lines due to infection/safety concerns. Pt. Stated understanding. All lines/fluids changed and reconnected.   Pt. Removed and turned off O2 sensor on two occasions. Pt told each time this is a safety mechanism and can not be taken off or turned off. Explained purpose of device and that PCA could not be used if pt. refused to wear monitor. Pt. Stated understanding.

## 2012-09-22 NOTE — ED Notes (Signed)
Spoke to [employer] to give report, said she will call me back to get report at better time for her.

## 2012-09-23 LAB — CBC
HCT: 22.5 % — ABNORMAL LOW (ref 39.0–52.0)
Hemoglobin: 7.7 g/dL — ABNORMAL LOW (ref 13.0–17.0)
MCH: 34.5 pg — ABNORMAL HIGH (ref 26.0–34.0)
MCHC: 34.2 g/dL (ref 30.0–36.0)
MCV: 100.9 fL — ABNORMAL HIGH (ref 78.0–100.0)
Platelets: 311 K/uL (ref 150–400)
RBC: 2.23 MIL/uL — ABNORMAL LOW (ref 4.22–5.81)
RDW: 18.9 % — ABNORMAL HIGH (ref 11.5–15.5)
WBC: 12.4 K/uL — ABNORMAL HIGH (ref 4.0–10.5)

## 2012-09-23 LAB — COMPREHENSIVE METABOLIC PANEL WITH GFR
ALT: 12 U/L (ref 0–53)
AST: 19 U/L (ref 0–37)
Albumin: 3.8 g/dL (ref 3.5–5.2)
Alkaline Phosphatase: 71 U/L (ref 39–117)
BUN: 7 mg/dL (ref 6–23)
CO2: 25 meq/L (ref 19–32)
Calcium: 8.4 mg/dL (ref 8.4–10.5)
Chloride: 109 meq/L (ref 96–112)
Creatinine, Ser: 0.72 mg/dL (ref 0.50–1.35)
GFR calc Af Amer: >90 mL/min
GFR calc non Af Amer: >90 mL/min
Glucose, Bld: 99 mg/dL (ref 70–99)
Potassium: 3.7 meq/L (ref 3.5–5.1)
Sodium: 141 meq/L (ref 135–145)
Total Bilirubin: 0.9 mg/dL (ref 0.3–1.2)
Total Protein: 6.3 g/dL (ref 6.0–8.3)

## 2012-09-23 LAB — GLUCOSE, CAPILLARY

## 2012-09-23 MED ORDER — HYDROMORPHONE 0.3 MG/ML IV SOLN
INTRAVENOUS | Status: DC
  Administered 2012-09-23: 7.99 mg via INTRAVENOUS
  Administered 2012-09-23: 5.49 mg via INTRAVENOUS
  Administered 2012-09-23 (×3): via INTRAVENOUS
  Administered 2012-09-23: 7.99 mg via INTRAVENOUS
  Administered 2012-09-24: 3.49 mg via INTRAVENOUS
  Administered 2012-09-24: 06:00:00 via INTRAVENOUS
  Administered 2012-09-24: 3.99 mg via INTRAVENOUS
  Filled 2012-09-23 (×4): qty 25

## 2012-09-23 MED ORDER — HYDROMORPHONE HCL PF 2 MG/ML IJ SOLN: 2.0000 mg | INTRAMUSCULAR | Status: DC | PRN

## 2012-09-23 NOTE — Progress Notes (Signed)
[facility] PROGRESS NOTE  Dennis Bernard ZOX:[id number] DOB: [birth year] DOA: 09/22/2012 PCP: No primary provider on file.  Assessment/Plan: Principal Problem:  Sickle cell pain crisis: Pt has had issues before with manipulation of medical devices both at this nstitution and reportedly at [facility] system.  Pt was noted last night to have removed his port in a non sterile manner and removed himself from the PCA. I spoke wit patient on his last admission about not tampering with medical devices as well as the risks involved in such tampering. I will likely discontinue PCA in order to ensure medical safety while hospitalized. 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 His conversation and interaction are indicative of understanding of medical risk. I will ask ST to evaluate cognition. Presently  He is on a PCA which I will continue. If there is any further tampering then patient will be removed from PCA and alternate treatment will have to be considered.   Sickle cell anemia: Hgb now at 7.7. Baseline Hgb of 8.1. Check LDH and CBC in am.  Would transfuse only if patient symptomatic with altered vital signs of acute anemia.   Hypokalemia: Corrected. Check Magnesium   Psychosocial: Pt engaging in actions that places him at medical risk. I have had a long discussion with patient in the presence of his nurse explaining that if he continues with these actions then we will discharge him due to an in ability to provide safe medical care.  Code Status: Full Family Communication: N/A Disposition Plan: Home in 3-4 days  MATTHEWS,MICHELLE A.  [facility] Pager [phone]. If 8PM-8AM, please contact night-coverage at [email or web address], password [facility] 09/23/2012, 10:31 AM  LOS: 1 day   Brief narrative: 27 year old male with past medical history of sickle cell disease who presents with worsening generalized pain which per patient is his typical sickle cell crisis  pain. Patient reports pain being mostly in his lower back, non-radiating, constant and not relieved with home analgesics. Patient reports no complaints of chest pain, he does endorse some cough but no sputum production. No complaints of shortness of breath. Patient did report having fever at home, maximum temperature 100 F. No complaints of abdominal pain, nausea or vomiting.  In ED, patient was given Dilaudid 2 mg repeatedly for total of 4 times but patient continues to complain of uncontrolled pain. His BMET revealed slight hypokalemia at 3.3 and CBC revealed hemoglobin of 8.5. Chest x-ray did not show any acute cardiopulmonary issues.   Consultants:  None  Procedures:  None  Antibiotics:  None  HPI/Subjective: Pt reports pain level at 8/10 decreasing to 6/10 with use of PCa. Pain localized to back, legs and chest.  Objective: Filed Vitals:   09/23/12 0330 09/23/12 0515 09/23/12 0857 09/23/12 0924  BP:  108/60  122/84  Pulse:  86  65  Temp:  98.1 F (36.7 C)  97.8 F (36.6 C)  TempSrc:  Oral  Oral  Resp: 11 14 13 18   Height:      Weight:  81.557 kg (179 lb 12.8 oz)    SpO2: 100% 100% 100% 100%   Weight change:   Intake/Output Summary (Last 24 hours) at 09/23/12 1031 Last data filed at 09/23/12 0924  Gross per 24 hour  Intake    375 ml  Output      0 ml  Net    375 ml    General: Alert, awake, oriented x3, in no acute distress.  HEENT:  [place]/AT PEERL, EOMI, Anicteric OROPHARYNX:  Moist, No exudate/ erythema/lesions.  Heart: Regular rate and rhythm, without murmurs, rubs, gallops, PMI non-displaced, no heaves or thrills on palpation.  Lungs: Clear to auscultation, no wheezing or rhonchi noted.  Abdomen: Soft, nontender, nondistended, positive bowel sounds, no masses no hepatosplenomegaly noted. Neuro: No focal neurological deficits noted cranial nerves II through XII grossly intact. Strength  normal in bilateral upper and lower extremities. Musculoskeletal: No warm  swelling or erythema around joints, no spinal tenderness noted.   Data Reviewed: Basic Metabolic Panel:  Lab 09/23/12 1610 09/22/12 1000 09/16/12 1405  NA 141 140 137  K 3.7 3.3* 3.6  CL 109 106 103  CO2 25 25 26   GLUCOSE 99 90 87  BUN 7 15 9   CREATININE 0.72 0.60 0.58  CALCIUM 8.4 9.2 9.7  MG -- -- --  PHOS -- -- --   Liver Function Tests:  Lab 09/23/12 0453 09/22/12 1000 09/16/12 1405  AST 19 19 35  ALT 12 13 33  ALKPHOS 71 82 100  BILITOT 0.9 1.0 1.7*  PROT 6.3 7.2 7.5  ALBUMIN 3.8 4.3 4.3   No results found for this basename: LIPASE:5,AMYLASE:5 in the last 168 hours No results found for this basename: AMMONIA:5 in the last 168 hours CBC:  Lab 09/23/12 0453 09/22/12 1000 09/16/12 1405  WBC 12.4* 9.7 10.2  NEUTROABS -- 6.4 7.9*  HGB 7.7* 8.5* 8.3*  HCT 22.5* 25.1* 23.7*  MCV 100.9* 99.2 94.4  PLT 311 400 605*   Cardiac Enzymes: No results found for this basename: CKTOTAL:5,CKMB:5,CKMBINDEX:5,TROPONINI:5 in the last 168 hours BNP (last 3 results) No results found for this basename: PROBNP:3 in the last 8760 hours CBG:  Lab 09/23/12 0926  GLUCAP 109*    No results found for this or any previous visit (from the past 240 hour(s)).   Studies: Dg Chest 2 View  09/22/2012  *RADIOLOGY REPORT*  Clinical Data: Sickle cell crisis, shortness of breath, fever, cough, congestion, body aches, history Port-A-Cath, hypertension  CHEST - 2 VIEW  Comparison: 09/16/2012  Findings: Left jugular dual lumen Port-A-Cath unchanged tip projecting over cavoatrial junction. Upper normal heart size. Slight pulmonary vascular congestion. Stable mediastinal contours. Lungs grossly clear. No pleural effusion or pneumothorax. Left shoulder joint replacement.  IMPRESSION: No acute abnormalities.   Original Report Authenticated By: Ulyses Southward, M.D.    Dg Chest 2 View  09/16/2012  *RADIOLOGY REPORT*  Clinical Data: Sickle cell crisis, low back pain  CHEST - 2 VIEW  Comparison: 09/08/2012   Findings: The left chest wall port is again noted.  Left shoulder replacement is also again seen.  The heart and pulmonary vascularity are within normal limits.  No focal infiltrate or effusion is noted.  The upper abdomen is unremarkable.  No acute bony abnormality is seen.  IMPRESSION: No acute abnormality noted.   Original Report Authenticated By: Alcide Clever, M.D.    Dg Chest 2 View  09/08/2012  *RADIOLOGY REPORT*  Clinical Data: Sickle cell crisis.  Chest pain and cough and fever  CHEST - 2 VIEW  Comparison: 08/26/2012  Findings: Heart size is mildly enlarged.  Port-A-Cath unchanged with the tip at the cavoatrial junction.  Prominent lung markings appear chronic and unchanged.  No superimposed edema or pneumonia. Negative for pleural effusion.  Left shoulder replacement.  IMPRESSION: No acute cardiopulmonary abnormality and no change from the prior study.   Original Report Authenticated By: Janeece Riggers, M.D.    X-ray Chest Pa And Lateral  08/26/2012  *RADIOLOGY REPORT*  Clinical Data: Low back and left leg pain after sickle cell crisis.  CHEST - 2 VIEW  Comparison: 08/16/2012  Findings: Shallow inspiration.  Normal heart size and pulmonary vascularity.  Increased interstitial markings may be related to sickle cell.  No focal consolidation.  No blunting of costophrenic angles.  No pneumothorax.  Central venous catheter remains unchanged in position.  Postoperative changes in the left shoulder. No significant change since previous study.  IMPRESSION: Shallow inspiration.  No evidence of active pulmonary disease.   Original Report Authenticated By: Burman Nieves, M.D.    Dg Lumbar Spine Complete  08/26/2012  *RADIOLOGY REPORT*  Clinical Data: Low back and leg pain.  Sickle cell crisis.  LUMBAR SPINE - COMPLETE 4+ VIEW  Comparison: None.  Findings: Five lumbar type vertebral bodies.  Mild endplate sclerosis and depression compatible with sickle cell changes. Normal alignment of the lumbar spine.  No  vertebral bone lesion or destruction.  Incidental note of sclerosis suggested in the right femoral head which could represent bone necrosis.  IMPRESSION: Sickle cell changes in the lumbar spine.  No displaced fractures. Possible necrosis of the right femoral head.   Original Report Authenticated By: Burman Nieves, M.D.    Dg Femur Left  08/26/2012  *RADIOLOGY REPORT*  Clinical Data: Low back and leg pain.  Sickle cell crisis.  LEFT FEMUR - 2 VIEW  Comparison: None.  Findings: Mild noncircumscribed sclerosis in the distal shaft and distal metaphyseal region of the left femur.  Changes may represent bone infarcts.  No evidence of acute fracture or subluxation.  No abnormal cortical erosion or expansile lesion.  No radiopaque soft tissue foreign bodies.  IMPRESSION: Suggestion of sclerosis in the distal femoral shaft and metaphyseal region compatible with early bone infarct change.   Original Report Authenticated By: Burman Nieves, M.D.     Scheduled Meds:   . folic acid  1 mg Oral Daily  . HYDROmorphone PCA 0.3 mg/mL   Intravenous Q4H  . hydroxyurea  2,000 mg Oral Daily  . morphine  60 mg Oral BID   Continuous Infusions:   . sodium chloride 100 mL/hr at 09/23/12 0525    Principal Problem:  *Sickle cell pain crisis Active Problems:  Sickle cell anemia  Hypokalemia

## 2012-09-23 NOTE — Evaluation (Signed)
Speech Language Pathology Evaluation Patient Details Name: Dennis Bernard MRN: [id number] DOB: Mar 21, 1986 Today's Date: 09/23/2012 Time: 4098-1191 SLP Time Calculation (min): 30 min  Problem List:  Patient Active Problem List  Diagnosis  . Sickle cell pain crisis  . Sickle cell anemia  . Hypokalemia   Past Medical History:  Past Medical History  Diagnosis Date  . Sickle cell disease   . Avascular necrosis of femur head, right   . H/O allergic rhinitis   . H/O hypokalemia   . Chronic pain syndrome    Past Surgical History:  Past Surgical History  Procedure Date  . Cholecystectomy 1995  . Left shoulder arthroplasty 04/09/2008  . Left pac placement 07/04/2012  . Exchange transfusion 02/2008   HPI:  27 yo male adm to [facility] with sickle cell crisis, pt also with slight fever.  Pt was recently discharged from [facility] for sickle cell crisis on 12/30.  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  Pt denies cognitive changes nor does he use supplemental services at [employer].     Assessment / Plan / Recommendation Clinical Impression  Pt with functional cognitive linguistic skills, he was able to participate in high level conversation re: premorbid status, sickle cell anemia and college career.  Pt denies receiving services (tutorial) at college and reports maintaining mostly A and B grades.  He admits attention span is decreased - as he can attend to class for 15 minutes and then starts to wane.   Pt also readily admits to poor selective attention, requiring focused studying in quiet environment.  SLP did not notice attention difficulties during testing.  High level problem solving questions completed without deficit nor delay.    No further slp indicated as his cogling skills are at baseline.    SLP educated pt to possible high level cognitive deficits found in pt's with sickle cell per literature including attention, executive functioning and  working memory.  Provided pt with tasks to maximize selective and divided attention abilities per his statement of difficulties.  Encouraged pt to pursue extra assistance at college via tutoring dept or special services dept if he detects cognitive changes that are impacting his college career.     SLP Assessment  Patient does not need any further Speech Lanaguage Pathology Services    Follow Up Recommendations    none   Frequency and Duration     n/a   Pertinent Vitals/Pain Afebrile, clear   SLP Goals     SLP Evaluation Prior Functioning  Cognitive/Linguistic Baseline: Within functional limits Type of Home: Apartment Lives With: Other (Comment) (friends, lives with roommates) Education: attending college, Arts administrator in [employer] Vocation: Student   Cognition  Orientation Level: Oriented X4 Attention: Sustained;Selective Sustained Attention: Appears intact Selective Attention: Appears intact Awareness: Appears intact Problem Solving: Appears intact Safety/Judgment: Appears intact Comments: pt answered high level cognitive questions:  functional math questions, inferencing    Comprehension  Auditory Comprehension Overall Auditory Comprehension: Appears within functional limits for tasks assessed    Expression Expression Primary Mode of Expression: Verbal Verbal Expression Overall Verbal Expression: Appears within functional limits for tasks assessed Written Expression Dominant Hand: Right   Oral / Motor Oral Motor/Sensory Function Overall Oral Motor/Sensory Function: Appears within functional limits for tasks assessed   GO     Donavan Burnet, MS [facility] SLP [phone]

## 2012-09-24 LAB — CBC WITH DIFFERENTIAL/PLATELET
Basophils Absolute: 0 10*3/uL (ref 0.0–0.1)
Basophils Relative: 0 % (ref 0–1)
Eosinophils Absolute: 0.3 10*3/uL (ref 0.0–0.7)
Hemoglobin: 7.9 g/dL — ABNORMAL LOW (ref 13.0–17.0)
Lymphocytes Relative: 57 % — ABNORMAL HIGH (ref 12–46)
MCHC: 34.3 g/dL (ref 30.0–36.0)
Neutrophils Relative %: 36 % — ABNORMAL LOW (ref 43–77)

## 2012-09-24 LAB — LACTATE DEHYDROGENASE: LDH: 227 U/L (ref 94–250)

## 2012-09-24 LAB — RETICULOCYTES: Retic Ct Pct: 11.7 % — ABNORMAL HIGH (ref 0.4–3.1)

## 2012-09-24 MED ORDER — MORPHINE SULFATE 15 MG PO TABS
15.0000 mg | ORAL_TABLET | Freq: Once | ORAL | Status: AC
  Administered 2012-09-24: 15 mg via ORAL
  Filled 2012-09-24: qty 1

## 2012-09-24 MED ORDER — ENOXAPARIN SODIUM 40 MG/0.4ML ~~LOC~~ SOLN
40.0000 mg | SUBCUTANEOUS | Status: DC
  Administered 2012-09-24: 40 mg via SUBCUTANEOUS
  Filled 2012-09-24 (×2): qty 0.4

## 2012-09-24 MED ORDER — MORPHINE SULFATE ER 60 MG PO TBCR: 60.0000 mg | EXTENDED_RELEASE_TABLET | Freq: Two times a day (BID) | ORAL | Status: DC

## 2012-09-24 MED ORDER — HYDROMORPHONE HCL 2 MG PO TABS: 2.0000 mg | ORAL_TABLET | ORAL | Status: DC | PRN

## 2012-09-24 MED ORDER — MORPHINE SULFATE 15 MG PO TABS: 15.0000 mg | ORAL_TABLET | Freq: Once | ORAL | Status: DC

## 2012-09-24 MED ORDER — MORPHINE SULFATE 15 MG PO TABS
15.0000 mg | ORAL_TABLET | ORAL | Status: DC | PRN
  Administered 2012-09-24 – 2012-09-25 (×5): 15 mg via ORAL
  Filled 2012-09-24 (×5): qty 1

## 2012-09-24 NOTE — Discharge Summary (Addendum)
Physician Discharge Summary  Dennis Bernard MRN: [id number] DOB/AGE: 1985-11-18 27 y.o.  PCP: No primary provider on file.   Admit date: 09/22/2012 Discharge date: 09/24/2012  Discharge Diagnoses:     *Sickle cell pain crisis Active Problems:  Sickle cell anemia  Hypokalemia     Discharge Condition: Stable  Disposition: 01-Home or Self Care   Consults: None  Significant Diagnostic Studies: Dg Chest 2 View  09/22/2012  *RADIOLOGY REPORT*  Clinical Data: Sickle cell crisis, shortness of breath, fever, cough, congestion, body aches, history Port-A-Cath, hypertension  CHEST - 2 VIEW  Comparison: 09/16/2012  Findings: Left jugular dual lumen Port-A-Cath unchanged tip projecting over cavoatrial junction. Upper normal heart size. Slight pulmonary vascular congestion. Stable mediastinal contours. Lungs grossly clear. No pleural effusion or pneumothorax. Left shoulder joint replacement.  IMPRESSION: No acute abnormalities.   Original Report Authenticated By: Ulyses Southward, M.D.    Dg Chest 2 View  09/16/2012  *RADIOLOGY REPORT*  Clinical Data: Sickle cell crisis, low back pain  CHEST - 2 VIEW  Comparison: 09/08/2012  Findings: The left chest wall port is again noted.  Left shoulder replacement is also again seen.  The heart and pulmonary vascularity are within normal limits.  No focal infiltrate or effusion is noted.  The upper abdomen is unremarkable.  No acute bony abnormality is seen.  IMPRESSION: No acute abnormality noted.   Original Report Authenticated By: Alcide Clever, M.D.    Dg Chest 2 View  09/08/2012  *RADIOLOGY REPORT*  Clinical Data: Sickle cell crisis.  Chest pain and cough and fever  CHEST - 2 VIEW  Comparison: 08/26/2012  Findings: Heart size is mildly enlarged.  Port-A-Cath unchanged with the tip at the cavoatrial junction.  Prominent lung markings appear chronic and unchanged.  No superimposed edema or pneumonia. Negative for pleural effusion.  Left shoulder  replacement.  IMPRESSION: No acute cardiopulmonary abnormality and no change from the prior study.   Original Report Authenticated By: Janeece Riggers, M.D.    X-ray Chest Pa And Lateral   08/26/2012  *RADIOLOGY REPORT*  Clinical Data: Low back and left leg pain after sickle cell crisis.  CHEST - 2 VIEW  Comparison: 08/16/2012  Findings: Shallow inspiration.  Normal heart size and pulmonary vascularity.  Increased interstitial markings may be related to sickle cell.  No focal consolidation.  No blunting of costophrenic angles.  No pneumothorax.  Central venous catheter remains unchanged in position.  Postoperative changes in the left shoulder. No significant change since previous study.  IMPRESSION: Shallow inspiration.  No evidence of active pulmonary disease.   Original Report Authenticated By: Burman Nieves, M.D.    Dg Lumbar Spine Complete  08/26/2012  *RADIOLOGY REPORT*  Clinical Data: Low back and leg pain.  Sickle cell crisis.  LUMBAR SPINE - COMPLETE 4+ VIEW  Comparison: None.  Findings: Five lumbar type vertebral bodies.  Mild endplate sclerosis and depression compatible with sickle cell changes. Normal alignment of the lumbar spine.  No vertebral bone lesion or destruction.  Incidental note of sclerosis suggested in the right femoral head which could represent bone necrosis.  IMPRESSION: Sickle cell changes in the lumbar spine.  No displaced fractures. Possible necrosis of the right femoral head.   Original Report Authenticated By: Burman Nieves, M.D.    Dg Femur Left  08/26/2012  *RADIOLOGY REPORT*  Clinical Data: Low back and leg pain.  Sickle cell crisis.  LEFT FEMUR - 2 VIEW  Comparison: None.  Findings: Mild noncircumscribed sclerosis in the  distal shaft and distal metaphyseal region of the left femur.  Changes may represent bone infarcts.  No evidence of acute fracture or subluxation.  No abnormal cortical erosion or expansile lesion.  No radiopaque soft tissue foreign bodies.   IMPRESSION: Suggestion of sclerosis in the distal femoral shaft and metaphyseal region compatible with early bone infarct change.   Original Report Authenticated By: Burman Nieves, M.D.       Microbiology: No results found for this or any previous visit (from the past 240 hour(s)).   Labs: Results for orders placed during the hospital encounter of 09/22/12 (from the past 48 hour(s))  CBC WITH DIFFERENTIAL     Status: Abnormal   Collection Time   09/22/12 10:00 AM      Component Value Range Comment   WBC 9.7  4.0 - 10.5 K/uL    RBC 2.53 (*) 4.22 - 5.81 MIL/uL    Hemoglobin 8.5 (*) 13.0 - 17.0 g/dL    HCT 16.1 (*) 09.6 - 52.0 %    MCV 99.2  78.0 - 100.0 fL    MCH 33.6  26.0 - 34.0 pg    MCHC 33.9  30.0 - 36.0 g/dL    RDW 04.5 (*) 40.9 - 15.5 %    Platelets 400  150 - 400 K/uL    Neutrophils Relative 66  43 - 77 %    Lymphocytes Relative 24  12 - 46 %    Monocytes Relative 10  3 - 12 %    Eosinophils Relative 0  0 - 5 %    Basophils Relative 0  0 - 1 %    Neutro Abs 6.4  1.7 - 7.7 K/uL    Lymphs Abs 2.3  0.7 - 4.0 K/uL    Monocytes Absolute 1.0  0.1 - 1.0 K/uL    Eosinophils Absolute 0.0  0.0 - 0.7 K/uL    Basophils Absolute 0.0  0.0 - 0.1 K/uL    RBC Morphology POLYCHROMASIA PRESENT     COMPREHENSIVE METABOLIC PANEL     Status: Abnormal   Collection Time   09/22/12 10:00 AM      Component Value Range Comment   Sodium 140  135 - 145 mEq/L    Potassium 3.3 (*) 3.5 - 5.1 mEq/L    Chloride 106  96 - 112 mEq/L    CO2 25  19 - 32 mEq/L    Glucose, Bld 90  70 - 99 mg/dL    BUN 15  6 - 23 mg/dL    Creatinine, Ser 8.11  0.50 - 1.35 mg/dL    Calcium 9.2  8.4 - 91.4 mg/dL    Total Protein 7.2  6.0 - 8.3 g/dL    Albumin 4.3  3.5 - 5.2 g/dL    AST 19  0 - 37 U/L    ALT 13  0 - 53 U/L    Alkaline Phosphatase 82  39 - 117 U/L    Total Bilirubin 1.0  0.3 - 1.2 mg/dL    GFR calc non Af Amer >90  >90 mL/min    GFR calc Af Amer >90  >90 mL/min   RETICULOCYTES     Status: Abnormal    Collection Time   09/22/12 10:00 AM      Component Value Range Comment   Retic Ct Pct 9.1 (*) 0.4 - 3.1 %    RBC. 2.53 (*) 4.22 - 5.81 MIL/uL    Retic Count, Manual 230.2 (*) 19.0 - 186.0 K/uL  URINALYSIS, ROUTINE W REFLEX MICROSCOPIC     Status: Normal   Collection Time   09/22/12 10:15 AM      Component Value Range Comment   Color, Urine YELLOW  YELLOW    APPearance CLEAR  CLEAR    Specific Gravity, Urine 1.015  1.005 - 1.030    pH 6.5  5.0 - 8.0    Glucose, UA NEGATIVE  NEGATIVE mg/dL    Hgb urine dipstick NEGATIVE  NEGATIVE    Bilirubin Urine NEGATIVE  NEGATIVE    Ketones, ur NEGATIVE  NEGATIVE mg/dL    Protein, ur NEGATIVE  NEGATIVE mg/dL    Urobilinogen, UA 0.2  0.0 - 1.0 mg/dL    Nitrite NEGATIVE  NEGATIVE    Leukocytes, UA NEGATIVE  NEGATIVE MICROSCOPIC NOT DONE ON URINES WITH NEGATIVE PROTEIN, BLOOD, LEUKOCYTES, NITRITE, OR GLUCOSE <1000 mg/dL.  COMPREHENSIVE METABOLIC PANEL     Status: Normal   Collection Time   09/23/12  4:53 AM      Component Value Range Comment   Sodium 141  135 - 145 mEq/L    Potassium 3.7  3.5 - 5.1 mEq/L    Chloride 109  96 - 112 mEq/L    CO2 25  19 - 32 mEq/L    Glucose, Bld 99  70 - 99 mg/dL    BUN 7  6 - 23 mg/dL    Creatinine, Ser 4.09  0.50 - 1.35 mg/dL    Calcium 8.4  8.4 - 81.1 mg/dL    Total Protein 6.3  6.0 - 8.3 g/dL    Albumin 3.8  3.5 - 5.2 g/dL    AST 19  0 - 37 U/L    ALT 12  0 - 53 U/L    Alkaline Phosphatase 71  39 - 117 U/L    Total Bilirubin 0.9  0.3 - 1.2 mg/dL    GFR calc non Af Amer >90  >90 mL/min    GFR calc Af Amer >90  >90 mL/min   CBC     Status: Abnormal   Collection Time   09/23/12  4:53 AM      Component Value Range Comment   WBC 12.4 (*) 4.0 - 10.5 K/uL    RBC 2.23 (*) 4.22 - 5.81 MIL/uL    Hemoglobin 7.7 (*) 13.0 - 17.0 g/dL    HCT 91.4 (*) 78.2 - 52.0 %    MCV 100.9 (*) 78.0 - 100.0 fL    MCH 34.5 (*) 26.0 - 34.0 pg    MCHC 34.2  30.0 - 36.0 g/dL    RDW 95.6 (*) 21.3 - 15.5 %    Platelets 311  150 - 400  K/uL   GLUCOSE, CAPILLARY     Status: Abnormal   Collection Time   09/23/12  9:26 AM      Component Value Range Comment   Glucose-Capillary 109 (*) 70 - 99 mg/dL    Comment 1 Documented in Chart      Comment 2 Notify RN     CBC WITH DIFFERENTIAL     Status: Abnormal   Collection Time   09/24/12  5:00 AM      Component Value Range Comment   WBC 9.5  4.0 - 10.5 K/uL    RBC 2.29 (*) 4.22 - 5.81 MIL/uL    Hemoglobin 7.9 (*) 13.0 - 17.0 g/dL    HCT 08.6 (*) 57.8 - 52.0 %    MCV 100.4 (*) 78.0 - 100.0 fL  MCH 34.5 (*) 26.0 - 34.0 pg    MCHC 34.3  30.0 - 36.0 g/dL    RDW 16.1 (*) 09.6 - 15.5 %    Platelets 314  150 - 400 K/uL    Neutrophils Relative 36 (*) 43 - 77 %    Lymphocytes Relative 57 (*) 12 - 46 %    Monocytes Relative 4  3 - 12 %    Eosinophils Relative 3  0 - 5 %    Basophils Relative 0  0 - 1 %    Neutro Abs 3.4  1.7 - 7.7 K/uL    Lymphs Abs 5.4 (*) 0.7 - 4.0 K/uL    Monocytes Absolute 0.4  0.1 - 1.0 K/uL    Eosinophils Absolute 0.3  0.0 - 0.7 K/uL    Basophils Absolute 0.0  0.0 - 0.1 K/uL    RBC Morphology POLYCHROMASIA PRESENT      WBC Morphology ATYPICAL LYMPHOCYTES     LACTATE DEHYDROGENASE     Status: Normal   Collection Time   09/24/12  5:00 AM      Component Value Range Comment   LDH 227  94 - 250 U/L NO VISIBLE HEMOLYSIS  MAGNESIUM     Status: Normal   Collection Time   09/24/12  5:00 AM      Component Value Range Comment   Magnesium 2.2  1.5 - 2.5 mg/dL   GLUCOSE, CAPILLARY     Status: Normal   Collection Time   09/24/12  8:00 AM      Component Value Range Comment   Glucose-Capillary 81  70 - 99 mg/dL      HPI : 27 year old male with past medical history of sickle cell disease who presents with worsening generalized pain which per patient is his typical sickle cell crisis pain. Patient reports pain being mostly in his lower back, non-radiating, constant and not relieved with home analgesics. Patient reports no complaints of chest pain, he does endorse  some cough but no sputum production. No complaints of shortness of breath. Patient did report having fever at home, maximum temperature 100 F. No complaints of abdominal pain, nausea or vomiting.  In ED, patient was given Dilaudid 2 mg repeatedly for total of 4 times but patient continues to complain of uncontrolled pain. His BMET revealed slight hypokalemia at 3.3 and CBC revealed hemoglobin of 8.5. Chest x-ray did not show any acute cardiopulmonary issues  HOSPITAL COURSE:  Sickle cell pain crisis: Pt has had issues before with manipulation of medical devices both at this institution and reportedly at [facility] system. Pt was noted last night to have removed his port in a non sterile manner and removed himself from the PCA. I spoke with patient on his last admission about not tampering with medical devices as well as the risks involved in such tampering. PCA was discontinued. 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 His conversation and interaction are indicative of understanding of medical risk. Patient was not transfused because LDH, total bilirubin were within normal limits although her reticulocyte count was mildly elevated Sickle cell anemia: Hgb now at 7.9. Baseline Hgb of 8.1. LDH, total bilirubin within normal limits.. Did not transfuse as the patient is not symptomatic   Hypokalemia: Corrected. Magnesium normal Psychosocial: Pt engaging in actions that places him at medical risk. I have had a long discussion with patient in the presence of his nurse explaining that if he continues with these  actions then we will discharge him due to an in ability to provide safe medical care.    Discharge Exam:  Blood pressure 107/57, pulse 77, temperature 98 F (36.7 C), temperature source Oral, resp. rate 12, height 5\' 9"  (1.753 m), weight 81.8 kg (180 lb 5.4 oz), SpO2 100.00%.   General: Alert, awake, oriented x3, in no acute distress.  HEENT: /AT PEERL, EOMI,  Anicteric  OROPHARYNX: Moist, No exudate/ erythema/lesions.  Heart: Regular rate and rhythm, without murmurs, rubs, gallops, PMI non-displaced, no heaves or thrills on palpation.  Lungs: Clear to auscultation, no wheezing or rhonchi noted.  Abdomen: Soft, nontender, nondistended, positive bowel sounds, no masses no hepatosplenomegaly noted.  Neuro: No focal neurological deficits noted cranial nerves II through XII grossly intact. Strength normal in bilateral upper and lower extremities.  Musculoskeletal: No warm swelling or erythema around joints, no spinal tenderness noted.    Addendum , patient requesting to stay another day , will switch to PO meds only, dc home in am    Signed: [facility] 09/24/2012, 8:47 AM

## 2012-09-25 DIAGNOSIS — D571 Sickle-cell disease without crisis: Secondary | ICD-10-CM

## 2012-09-25 LAB — CBC WITH DIFFERENTIAL/PLATELET
Basophils Absolute: 0 10*3/uL (ref 0.0–0.1)
Basophils Relative: 0 % (ref 0–1)
Eosinophils Absolute: 0.1 10*3/uL (ref 0.0–0.7)
Eosinophils Relative: 1 % (ref 0–5)
HCT: 24.6 % — ABNORMAL LOW (ref 39.0–52.0)
Hemoglobin: 8.4 g/dL — ABNORMAL LOW (ref 13.0–17.0)
Lymphocytes Relative: 49 % — ABNORMAL HIGH (ref 12–46)
Lymphs Abs: 4.8 10*3/uL — ABNORMAL HIGH (ref 0.7–4.0)
MCH: 34.1 pg — ABNORMAL HIGH (ref 26.0–34.0)
MCHC: 34.1 g/dL (ref 30.0–36.0)
MCV: 100 fL (ref 78.0–100.0)
Monocytes Absolute: 0.4 10*3/uL (ref 0.1–1.0)
Monocytes Relative: 4 % (ref 3–12)
Neutro Abs: 4.5 10*3/uL (ref 1.7–7.7)
Neutrophils Relative %: 46 % (ref 43–77)
Platelets: ADEQUATE 10*3/uL (ref 150–400)
RBC: 2.46 MIL/uL — ABNORMAL LOW (ref 4.22–5.81)
RDW: 18.4 % — ABNORMAL HIGH (ref 11.5–15.5)
WBC: 9.8 10*3/uL (ref 4.0–10.5)

## 2012-09-25 LAB — COMPREHENSIVE METABOLIC PANEL WITH GFR
ALT: 12 U/L (ref 0–53)
AST: 19 U/L (ref 0–37)
Albumin: 3.9 g/dL (ref 3.5–5.2)
Alkaline Phosphatase: 67 U/L (ref 39–117)
BUN: 6 mg/dL (ref 6–23)
CO2: 26 meq/L (ref 19–32)
Calcium: 8.9 mg/dL (ref 8.4–10.5)
Chloride: 106 meq/L (ref 96–112)
Creatinine, Ser: 0.58 mg/dL (ref 0.50–1.35)
GFR calc Af Amer: >90 mL/min
GFR calc non Af Amer: >90 mL/min
Glucose, Bld: 90 mg/dL (ref 70–99)
Potassium: 3.4 meq/L — ABNORMAL LOW (ref 3.5–5.1)
Sodium: 141 meq/L (ref 135–145)
Total Bilirubin: 1.4 mg/dL — ABNORMAL HIGH (ref 0.3–1.2)
Total Protein: 6.4 g/dL (ref 6.0–8.3)

## 2012-09-25 LAB — GLUCOSE, CAPILLARY: Glucose-Capillary: 78 mg/dL (ref 70–99)

## 2012-09-25 MED ORDER — MORPHINE SULFATE ER 60 MG PO TBCR: 60.0000 mg | EXTENDED_RELEASE_TABLET | Freq: Two times a day (BID) | ORAL | Status: DC

## 2012-09-25 MED ORDER — HEPARIN SOD (PORK) LOCK FLUSH 100 UNIT/ML IV SOLN
INTRAVENOUS | Status: AC
  Administered 2012-09-25: 11:00:00
  Filled 2012-09-25: qty 5

## 2012-09-25 MED ORDER — MORPHINE SULFATE 15 MG PO TABS: 15.0000 mg | ORAL_TABLET | ORAL | Status: DC | PRN

## 2012-09-25 MED ORDER — HEPARIN SOD (PORK) LOCK FLUSH 100 UNIT/ML IV SOLN: 500.0000 [IU] | Freq: Once | INTRAVENOUS | Status: DC

## 2012-09-25 MED ORDER — POTASSIUM CHLORIDE CRYS ER 20 MEQ PO TBCR
40.0000 meq | EXTENDED_RELEASE_TABLET | Freq: Once | ORAL | Status: AC
  Administered 2012-09-25: 40 meq via ORAL
  Filled 2012-09-25: qty 2

## 2012-09-25 NOTE — Progress Notes (Signed)
Patient to discharge home with family. Given all discharge instructions and prescriptions. Patient expressed understanding.

## 2012-09-25 NOTE — Progress Notes (Signed)
Patient ID: Dennis Bernard  male  WUJ:[id number]    DOB: 12-Dec-1985    DOA: 09/22/2012  PCP: No primary provider on file.  Assessment/Plan: Principal Problem:  *Sickle cell pain crisis Active Problems:  Sickle cell anemia  Hypokalemia   Sickle cell pain crisis: IMPROVED, patient to be discharged home, per patient, has follow-up appt with Dr [name] on Jan 22nd.  Gave prescriptions for MS contin 60mg  q12 #30, MSIR 15mg   q4hrs PRN #60.   Sickle cell anemia: Hgb stable at 8.4 today  Hypokalemia: Corrected.   DVT Prophylaxis:  Code Status: FC   Disposition:    Subjective: Feeling a lot better today, ready for discharge today  Objective: Weight change: -2.783 kg (-6 lb 2.2 oz)  Intake/Output Summary (Last 24 hours) at 09/25/12 0944 Last data filed at 09/25/12 0600  Gross per 24 hour  Intake   2029 ml  Output   2850 ml  Net   -821 ml   Blood pressure 112/64, pulse 66, temperature 97.6 F (36.4 C), temperature source Oral, resp. rate 16, height 5\' 9"  (1.753 m), weight 79.017 kg (174 lb 3.2 oz), SpO2 94.00%.  Physical Exam: General: Alert and awake, oriented x3, not in any acute distress. HEENT: anicteric sclera, pupils reactive to light and accommodation, EOMI CVS: S1-S2 clear, no murmur rubs or gallops Chest: clear to auscultation bilaterally, no wheezing, rales or rhonchi Abdomen: soft nontender, nondistended, normal bowel sounds, no organomegaly Extremities: no cyanosis, clubbing or edema noted bilaterally Neuro: Cranial nerves II-XII intact, no focal neurological deficits  Lab Results: Basic Metabolic Panel:  Lab 09/25/12 9562 09/24/12 0500 09/23/12 0453  NA 141 -- 141  K 3.4* -- 3.7  CL 106 -- 109  CO2 26 -- 25  GLUCOSE 90 -- 99  BUN 6 -- 7  CREATININE 0.58 -- 0.72  CALCIUM 8.9 -- 8.4  MG -- 2.2 --  PHOS -- -- --   Liver Function Tests:  Lab 09/25/12 0450 09/23/12 0453  AST 19 19  ALT 12 12  ALKPHOS 67 71  BILITOT 1.4* 0.9  PROT 6.4 6.3  ALBUMIN  3.9 3.8   No results found for this basename: LIPASE:2,AMYLASE:2 in the last 168 hours No results found for this basename: AMMONIA:2 in the last 168 hours CBC:  Lab 09/25/12 0450 09/24/12 0500  WBC 9.8 9.5  NEUTROABS 4.5 --  HGB 8.4* 7.9*  HCT 24.6* 23.0*  MCV 100.0 100.4*  PLT PLATELET CLUMPS NOTED ON SMEAR, COUNT APPEARS ADEQUATE 314   Cardiac Enzymes: No results found for this basename: CKTOTAL:3,CKMB:3,CKMBINDEX:3,TROPONINI:3 in the last 168 hours BNP: No components found with this basename: POCBNP:2 CBG:  Lab 09/25/12 0736 09/24/12 0800 09/23/12 0926  GLUCAP 78 81 109*     Micro Results: No results found for this or any previous visit (from the past 240 hour(s)).  Studies/Results: Dg Chest 2 View  09/22/2012  *RADIOLOGY REPORT*  Clinical Data: Sickle cell crisis, shortness of breath, fever, cough, congestion, body aches, history Port-A-Cath, hypertension  CHEST - 2 VIEW  Comparison: 09/16/2012  Findings: Left jugular dual lumen Port-A-Cath unchanged tip projecting over cavoatrial junction. Upper normal heart size. Slight pulmonary vascular congestion. Stable mediastinal contours. Lungs grossly clear. No pleural effusion or pneumothorax. Left shoulder joint replacement.  IMPRESSION: No acute abnormalities.   Original Report Authenticated By: Ulyses Southward, M.D.    Dg Chest 2 View  09/16/2012  *RADIOLOGY REPORT*  Clinical Data: Sickle cell crisis, low back pain  CHEST - 2 VIEW  Comparison: 09/08/2012  Findings: The left chest wall port is again noted.  Left shoulder replacement is also again seen.  The heart and pulmonary vascularity are within normal limits.  No focal infiltrate or effusion is noted.  The upper abdomen is unremarkable.  No acute bony abnormality is seen.  IMPRESSION: No acute abnormality noted.   Original Report Authenticated By: Alcide Clever, M.D.    Dg Chest 2 View  09/08/2012  *RADIOLOGY REPORT*  Clinical Data: Sickle cell crisis.  Chest pain and cough and fever   CHEST - 2 VIEW  Comparison: 08/26/2012  Findings: Heart size is mildly enlarged.  Port-A-Cath unchanged with the tip at the cavoatrial junction.  Prominent lung markings appear chronic and unchanged.  No superimposed edema or pneumonia. Negative for pleural effusion.  Left shoulder replacement.  IMPRESSION: No acute cardiopulmonary abnormality and no change from the prior study.   Original Report Authenticated By: Janeece Riggers, M.D.     Medications: Scheduled Meds:   . enoxaparin  40 mg Subcutaneous Q24H  . folic acid  1 mg Oral Daily  . hydroxyurea  2,000 mg Oral Daily  . morphine  60 mg Oral BID      LOS: 3 days   Priscillia Fouch M.D. [facility] 09/25/2012, 9:44 AM Pager: [phone]  If 7PM-7AM, please contact night-coverage [email or web address] Password [email or web address]

## 2012-09-30 LAB — CBC WITH DIFFERENTIAL/PLATELET
Bands: 1 %
HCT: 26 % — ABNORMAL LOW
HGB: 8.7 g/dL — ABNORMAL LOW
Lymphocytes: 36 %
MCH: 35.9 pg — ABNORMAL HIGH
MCHC: 33.6 g/dL
MCV: 107 fL — ABNORMAL HIGH
Monocytes: 9 %
NRBC/100 WBC: 23 /
Other Cells Blood: 1
Platelet: 253 x10 3/mm 3
RBC: 2.44 x10 6/mm 3 — ABNORMAL LOW
RDW: 19.7 % — ABNORMAL HIGH
Segmented Neutrophils: 53 %
WBC: 10.4 x10 3/mm 3

## 2012-09-30 LAB — BASIC METABOLIC PANEL
Anion Gap: 7 (ref 7–16)
BUN: 11 mg/dL (ref 7–18)
Chloride: 106 mmol/L (ref 98–107)
Creatinine: 0.64 mg/dL (ref 0.60–1.30)
EGFR (African American): >60
EGFR (Non-African Amer.): >60
Sodium: 138 mmol/L (ref 136–145)

## 2012-09-30 LAB — RETICULOCYTES: Absolute Retic Count: 0.2398 10*6/uL — ABNORMAL HIGH (ref 0.031–0.129)

## 2012-10-01 ENCOUNTER — Inpatient Hospital Stay: Payer: Self-pay | Admitting: Internal Medicine

## 2012-10-02 LAB — BASIC METABOLIC PANEL
Anion Gap: 9 (ref 7–16)
BUN: 11 mg/dL (ref 7–18)
Co2: 24 mmol/L (ref 21–32)
Glucose: 105 mg/dL — ABNORMAL HIGH (ref 65–99)

## 2012-10-02 LAB — CBC WITH DIFFERENTIAL/PLATELET
Bands: 1 %
HGB: 8.7 g/dL — ABNORMAL LOW (ref 13.0–18.0)
Lymphocytes: 46 %
MCH: 36.3 pg — ABNORMAL HIGH (ref 26.0–34.0)
MCHC: 34 g/dL (ref 32.0–36.0)
MCV: 107 fL — ABNORMAL HIGH (ref 80–100)
NRBC/100 WBC: 30 /
RBC: 2.38 10*6/uL — ABNORMAL LOW (ref 4.40–5.90)
Segmented Neutrophils: 41 %
WBC: 6.2 10*3/uL (ref 3.8–10.6)

## 2012-10-04 NOTE — ED Provider Notes (Signed)
Medical screening examination/treatment/procedure(s) were performed by non-physician practitioner and as supervising physician I was immediately available for consultation/collaboration  Devoria Albe, MD, Armando Gang   Ward Givens, MD 10/04/12 1859

## 2012-10-06 ENCOUNTER — Other Ambulatory Visit: Payer: Self-pay

## 2012-10-06 ENCOUNTER — Emergency Department (HOSPITAL_COMMUNITY): Payer: Medicare Other

## 2012-10-06 ENCOUNTER — Encounter (HOSPITAL_COMMUNITY): Payer: Self-pay | Admitting: Emergency Medicine

## 2012-10-06 ENCOUNTER — Inpatient Hospital Stay (HOSPITAL_COMMUNITY)
Admission: EM | Admit: 2012-10-06 | Discharge: 2012-10-09 | DRG: 812 | Disposition: A | Discharge status: Home / Self Care | Payer: Medicare Other | Attending: Internal Medicine | Admitting: Internal Medicine

## 2012-10-06 DIAGNOSIS — D571 Sickle-cell disease without crisis: Secondary | ICD-10-CM

## 2012-10-06 DIAGNOSIS — D5700 Hb-SS disease with crisis, unspecified: Principal | ICD-10-CM | POA: Diagnosis present

## 2012-10-06 DIAGNOSIS — D72829 Elevated white blood cell count, unspecified: Secondary | ICD-10-CM | POA: Diagnosis present

## 2012-10-06 LAB — CBC
HCT: 23.6 % — ABNORMAL LOW (ref 39.0–52.0)
Hemoglobin: 8.1 g/dL — ABNORMAL LOW (ref 13.0–17.0)
MCH: 35.2 pg — ABNORMAL HIGH (ref 26.0–34.0)
MCHC: 34.3 g/dL (ref 30.0–36.0)
MCV: 102.6 fL — ABNORMAL HIGH (ref 78.0–100.0)

## 2012-10-06 LAB — COMPREHENSIVE METABOLIC PANEL
Alkaline Phosphatase: 89 U/L (ref 39–117)
BUN: 9 mg/dL (ref 6–23)
Creatinine, Ser: 0.57 mg/dL (ref 0.50–1.35)
GFR calc Af Amer: >90 mL/min (ref >90)
Glucose, Bld: 102 mg/dL — ABNORMAL HIGH (ref 70–99)
Potassium: 3.5 mEq/L (ref 3.5–5.1)
Total Protein: 7.5 g/dL (ref 6.0–8.3)

## 2012-10-06 LAB — TROPONIN I: Troponin I: <0.3 ng/mL (ref <0.30)

## 2012-10-06 LAB — RETICULOCYTES: Retic Ct Pct: 11.6 % — ABNORMAL HIGH (ref 0.4–3.1)

## 2012-10-06 MED ORDER — HYDROMORPHONE HCL PF 2 MG/ML IJ SOLN
2.0000 mg | Freq: Once | INTRAMUSCULAR | Status: AC
  Administered 2012-10-06: 2 mg via INTRAVENOUS
  Filled 2012-10-06: qty 1

## 2012-10-06 MED ORDER — SODIUM CHLORIDE 0.9 % IV SOLN
Freq: Once | INTRAVENOUS | Status: AC
  Administered 2012-10-06: 22:00:00 via INTRAVENOUS

## 2012-10-06 MED ORDER — ONDANSETRON HCL 4 MG/2ML IJ SOLN
4.0000 mg | Freq: Once | INTRAMUSCULAR | Status: AC
  Administered 2012-10-06: 4 mg via INTRAVENOUS
  Filled 2012-10-06: qty 2

## 2012-10-06 MED ORDER — SODIUM CHLORIDE 0.9 % IV BOLUS (SEPSIS)
1000.0000 mL | Freq: Once | INTRAVENOUS | Status: AC
  Administered 2012-10-06: 1000 mL via INTRAVENOUS

## 2012-10-06 MED ORDER — DIPHENHYDRAMINE HCL 50 MG/ML IJ SOLN
25.0000 mg | Freq: Once | INTRAMUSCULAR | Status: AC
  Administered 2012-10-06: 25 mg via INTRAVENOUS
  Filled 2012-10-06: qty 1

## 2012-10-06 NOTE — ED Provider Notes (Signed)
History     CSN: [id number]  Arrival date & time 10/06/12  Dennis Bernard   First MD Initiated Contact with Patient 10/06/12 1956      Chief Complaint  Patient presents with  . Sickle Cell Pain Crisis    (Consider location/radiation/quality/duration/timing/severity/associated sxs/prior treatment) HPI Comments: Patient with frequent SSC last DC hospital 09/25/12  New episode of pain started 2 days ago called DR. Dean but did not go to clinic or set appointment States taken meds at home without relief.  This episodes includes chest tightness, nausea --patient has never had cheat syndrome but report he recently has URI denies vomiting or diarrhea, diaphoresis.   Patient is a 27 y.o. male presenting with sickle cell pain. The history is provided by the patient.  Sickle Cell Pain Crisis  This is a recurrent problem. The current episode started 2 days ago. The problem has been unchanged. The pain is present in the lower extremities. Site of pain is localized in muscle. The pain is similar to prior episodes. The pain is severe. Nothing relieves the symptoms. Associated symptoms include chest pain, nausea and congestion. Pertinent negatives include no diarrhea, no vomiting, no cough and no rash.    Past Medical History  Diagnosis Date  . Sickle cell disease   . Avascular necrosis of femur head, right   . H/O allergic rhinitis   . H/O hypokalemia   . Chronic pain syndrome     Past Surgical History  Procedure Date  . Cholecystectomy 1995  . Left shoulder arthroplasty 04/09/2008  . Left pac placement 07/04/2012  . Exchange transfusion 02/2008    Family History  Problem Relation Age of Onset  . Sickle cell anemia Brother   . Sickle cell trait Father   . Sickle cell trait Mother   . Diabetes Mellitus II Mother   . Sickle cell anemia Paternal Uncle     History  Substance Use Topics  . Smoking status: Former Smoker    Types: Cigars  . Smokeless tobacco: Never Used  . Alcohol Use: No       Review of Systems  Constitutional: Negative for fever.  HENT: Positive for congestion.   Eyes: Negative.   Respiratory: Negative for cough and shortness of breath.   Cardiovascular: Positive for chest pain. Negative for leg swelling.  Gastrointestinal: Positive for nausea. Negative for vomiting and diarrhea.  Musculoskeletal: Positive for arthralgias. Negative for joint swelling.  Skin: Negative for rash and wound.  Neurological: Negative.     Allergies  Review of patient's allergies indicates no known allergies.  Home Medications   Current Outpatient Rx  Name  Route  Sig  Dispense  Refill  . ALBUTEROL SULFATE HFA 108 (90 BASE) MCG/ACT IN AERS   Inhalation   Inhale 2 puffs into the lungs every 6 (six) hours as needed. wheezing         . DIPHENHYDRAMINE HCL 25 MG PO TABS   Oral   Take 25 mg by mouth every 8 (eight) hours as needed. For itching.         Marland Kitchen FOLIC ACID 1 MG PO TABS   Oral   Take 1 mg by mouth daily.         Marland Kitchen HYDROXYUREA 500 MG PO CAPS   Oral   Take 2,000 mg by mouth daily. May take with food to minimize GI side effects.         . MORPHINE SULFATE ER 60 MG PO TBCR   Oral  Take 1 tablet (60 mg total) by mouth 2 (two) times daily.   30 tablet   0   . MORPHINE SULFATE 15 MG PO TABS   Oral   Take 1 tablet (15 mg total) by mouth every 4 (four) hours as needed. Pain   60 tablet   0   . PROMETHAZINE HCL 25 MG PO TABS   Oral   Take 25 mg by mouth every 6 (six) hours as needed. Nausea         . SENNOSIDES-DOCUSATE SODIUM 8.6-50 MG PO TABS   Oral   Take 1 tablet by mouth daily as needed. Constipation           BP 111/60  Pulse 104  Temp 99.8 F (37.7 C) (Oral)  Resp 23  SpO2 100%  Physical Exam  Constitutional: He is oriented to person, place, and time. He appears well-developed and well-nourished.  HENT:  Head: Normocephalic.  Eyes: Pupils are equal, round, and reactive to light.  Neck: Normal range of motion.   Cardiovascular: Tachycardia present.   Pulmonary/Chest: Effort normal and breath sounds normal. No respiratory distress. He exhibits no tenderness.  Abdominal: Soft. There is no tenderness.  Musculoskeletal: Normal range of motion.  Neurological: He is alert and oriented to person, place, and time.  Skin: Skin is warm. No rash noted.    ED Course  Procedures (including critical care time)  Labs Reviewed  CBC - Abnormal; Notable for the following:    WBC 12.2 (*)     RBC 2.30 (*)     Hemoglobin 8.1 (*)     HCT 23.6 (*)     MCV 102.6 (*)     MCH 35.2 (*)     RDW 18.6 (*)     All other components within normal limits  COMPREHENSIVE METABOLIC PANEL - Abnormal; Notable for the following:    Glucose, Bld 102 (*)     Total Bilirubin 1.4 (*)     All other components within normal limits  RETICULOCYTES - Abnormal; Notable for the following:    Retic Ct Pct 11.6 (*)     RBC. 2.30 (*)     Retic Count, Manual 266.8 (*)     All other components within normal limits  TROPONIN I  TROPONIN I  TROPONIN I   Dg Chest 2 View  10/06/2012  *RADIOLOGY REPORT*  Clinical Data: Chest pain with sickle cell crisis.  CHEST - 2 VIEW  Comparison: 09/22/2012  Findings: Two views of the chest demonstrate a left jugular Port-A- Cath.  Catheter tip is near the SVC/right atrium junction.  There are decreased lung volumes with vascular crowding.  No focal airspace disease.  Heart size is normal.  No evidence for pleural effusions. Left shoulder replacement.  IMPRESSION: Low lung volumes without focal disease.   Original Report Authenticated By: Richarda Overlie, M.D.      1. Sickle cell crisis     ED ECG REPORT   Date: 10/07/2012  EKG Time: 12:02 AM  Rate: 125  Rhythm: sinus tachycardia,  unchanged from previous tracings  Axis:normal  Intervals:none  ST&T Change non specific T wave abnormalities diffuse leads  Narrative Interpretation: abnormal            MDM   No better after IV medications  Labs  indicate reticu count mildly elevated chest xray and EKG normal  Troponin negative  Will admit        Arman Filter, NP 10/07/12 0002

## 2012-10-06 NOTE — ED Notes (Signed)
Earley Favor, NP notified of pt's continued c/o pain with no relief from medications.

## 2012-10-06 NOTE — ED Notes (Signed)
[id number]<EX> Expected date:<BR> Expected time:<BR> Means of arrival:<BR> Comments:<BR> Resp Distress, dec lung sounds

## 2012-10-06 NOTE — ED Notes (Signed)
States that he is in sickle cell crisis. States that he has chest tightness and feels faint for the past 2 days

## 2012-10-07 ENCOUNTER — Encounter (HOSPITAL_COMMUNITY): Payer: Self-pay | Admitting: *Deleted

## 2012-10-07 DIAGNOSIS — D5700 Hb-SS disease with crisis, unspecified: Principal | ICD-10-CM

## 2012-10-07 MED ORDER — SENNOSIDES-DOCUSATE SODIUM 8.6-50 MG PO TABS
1.0000 | ORAL_TABLET | Freq: Every day | ORAL | Status: DC | PRN
  Filled 2012-10-07: qty 1

## 2012-10-07 MED ORDER — MORPHINE SULFATE ER 30 MG PO TBCR
60.0000 mg | EXTENDED_RELEASE_TABLET | Freq: Two times a day (BID) | ORAL | Status: DC
  Administered 2012-10-07 – 2012-10-09 (×6): 60 mg via ORAL
  Filled 2012-10-07 (×6): qty 2

## 2012-10-07 MED ORDER — HYDROMORPHONE HCL PF 1 MG/ML IJ SOLN
1.0000 mg | INTRAMUSCULAR | Status: DC | PRN
  Administered 2012-10-07 (×2): 2 mg via INTRAVENOUS
  Administered 2012-10-07: 1 mg via INTRAVENOUS
  Administered 2012-10-07 (×2): 2 mg via INTRAVENOUS
  Administered 2012-10-07: 1 mg via INTRAVENOUS
  Filled 2012-10-07 (×5): qty 2

## 2012-10-07 MED ORDER — FOLIC ACID 1 MG PO TABS
1.0000 mg | ORAL_TABLET | Freq: Every day | ORAL | Status: DC
Start: 2012-10-07 — End: 2012-10-07
  Filled 2012-10-07: qty 1

## 2012-10-07 MED ORDER — ALBUTEROL SULFATE HFA 108 (90 BASE) MCG/ACT IN AERS: 2.0000 | INHALATION_SPRAY | Freq: Four times a day (QID) | RESPIRATORY_TRACT | Status: DC | PRN

## 2012-10-07 MED ORDER — SODIUM CHLORIDE 0.9 % IV SOLN
INTRAVENOUS | Status: DC
  Administered 2012-10-07 (×2): via INTRAVENOUS
  Administered 2012-10-07: 125 mL via INTRAVENOUS

## 2012-10-07 MED ORDER — ONDANSETRON HCL 4 MG/2ML IJ SOLN
4.0000 mg | Freq: Four times a day (QID) | INTRAMUSCULAR | Status: DC | PRN
  Administered 2012-10-07 (×3): 4 mg via INTRAVENOUS
  Filled 2012-10-07 (×3): qty 2

## 2012-10-07 MED ORDER — HYDROMORPHONE HCL PF 2 MG/ML IJ SOLN
2.0000 mg | Freq: Once | INTRAMUSCULAR | Status: AC
  Administered 2012-10-07: 2 mg via INTRAVENOUS

## 2012-10-07 MED ORDER — FOLIC ACID 1 MG PO TABS
1.0000 mg | ORAL_TABLET | Freq: Every day | ORAL | Status: DC
  Administered 2012-10-07 – 2012-10-09 (×3): 1 mg via ORAL
  Filled 2012-10-07 (×3): qty 1

## 2012-10-07 MED ORDER — HYDROMORPHONE HCL PF 2 MG/ML IJ SOLN
4.0000 mg | INTRAMUSCULAR | Status: DC
  Administered 2012-10-07 – 2012-10-09 (×24): 4 mg via INTRAVENOUS
  Filled 2012-10-07 (×4): qty 2
  Filled 2012-10-07: qty 1
  Filled 2012-10-07 (×20): qty 2

## 2012-10-07 MED ORDER — HYDROXYUREA 500 MG PO CAPS
2000.0000 mg | ORAL_CAPSULE | Freq: Every day | ORAL | Status: DC
  Administered 2012-10-07 – 2012-10-09 (×3): 2000 mg via ORAL
  Filled 2012-10-07 (×3): qty 4

## 2012-10-07 MED ORDER — KETOROLAC TROMETHAMINE 30 MG/ML IJ SOLN
30.0000 mg | Freq: Four times a day (QID) | INTRAMUSCULAR | Status: DC
  Administered 2012-10-07 – 2012-10-09 (×8): 30 mg via INTRAVENOUS
  Filled 2012-10-07 (×15): qty 1

## 2012-10-07 MED ORDER — DIPHENHYDRAMINE HCL 25 MG PO CAPS
25.0000 mg | ORAL_CAPSULE | Freq: Three times a day (TID) | ORAL | Status: DC | PRN
  Administered 2012-10-07: 25 mg via ORAL
  Filled 2012-10-07: qty 1

## 2012-10-07 NOTE — ED Notes (Signed)
Spoke with [facility], stated Dr. [name] is not accepting new patients at this time, so this pt cannot be accepted to the clinic.  Spoke with Dr. Julian Reil and he stated pt will go in observation on a Med-Surg floor.

## 2012-10-07 NOTE — ED Notes (Signed)
Dennis Bernard paged about pt placement

## 2012-10-07 NOTE — H&P (Addendum)
[facility] History and Physical  Dennis Bernard NFA:[id number] DOB: 1986/05/14 DOA: 10/06/2012  Referring physician: ED PCP: No primary provider on file.  Specialists: Dr. [name] sees for sickle cell disease.  Chief Complaint: Sickle cell pain crisis  HPI: Dennis Bernard is a 27 y.o. male with frequent SSC last DC'd from hospital 09/25/12, new episode of pain started 2 days ago, called Dr. [name] but did not go to clinic or set up appointment.  Took home meds without relief.  Episode today includes chest tightness, never had chest syndrome before but has had chest pain with sickle cell disease before.  Including on his recent December admission.  Lab workup in the ED revealed normal troponin, no evidence of acute chest syndrome on CXR.  Pain not relieved with IV dilaudid, so hospitalist asked to admit.  Review of Systems: 12 systems reviewed and otherwise negative.  Past Medical History  Diagnosis Date  . Sickle cell disease   . Avascular necrosis of femur head, right   . H/O allergic rhinitis   . H/O hypokalemia   . Chronic pain syndrome    Past Surgical History  Procedure Date  . Cholecystectomy 1995  . Left shoulder arthroplasty 04/09/2008  . Left pac placement 07/04/2012  . Exchange transfusion 02/2008   Social History:  reports that he has quit smoking. His smoking use included Cigars. He has never used smokeless tobacco. He reports that he does not drink alcohol or use illicit drugs.   No Known Allergies  Family History  Problem Relation Age of Onset  . Sickle cell anemia Brother   . Sickle cell trait Father   . Sickle cell trait Mother   . Diabetes Mellitus II Mother   . Sickle cell anemia Paternal Uncle     Prior to Admission medications   Medication Sig Start Date End Date Taking? Authorizing Provider  albuterol (PROVENTIL HFA;VENTOLIN HFA) 108 (90 BASE) MCG/ACT inhaler Inhale 2 puffs into the lungs every 6 (six) hours as needed. wheezing 09/12/12  Yes  Christina P Rama, MD  diphenhydrAMINE (BENADRYL) 25 MG tablet Take 25 mg by mouth every 8 (eight) hours as needed. For itching. 08/23/12  Yes Dow Adolph, MD  folic acid (FOLVITE) 1 MG tablet Take 1 mg by mouth daily.   Yes Historical Provider, MD  hydroxyurea (HYDREA) 500 MG capsule Take 2,000 mg by mouth daily. May take with food to minimize GI side effects.   Yes Historical Provider, MD  morphine (MS CONTIN) 60 MG 12 hr tablet Take 1 tablet (60 mg total) by mouth 2 (two) times daily. 09/25/12  Yes Ripudeep Jenna Luo, MD  morphine (MSIR) 15 MG tablet Take 1 tablet (15 mg total) by mouth every 4 (four) hours as needed. Pain 09/25/12  Yes Ripudeep Jenna Luo, MD  promethazine (PHENERGAN) 25 MG tablet Take 25 mg by mouth every 6 (six) hours as needed. Nausea   Yes Historical Provider, MD  senna-docusate (SENOKOT-S) 8.6-50 MG per tablet Take 1 tablet by mouth daily as needed. Constipation 08/27/12  Yes Linward Headland, MD   Physical Exam: Filed Vitals:   10/06/12 2000 10/06/12 2130 10/06/12 2230 10/06/12 2300  BP: 115/66 113/63 109/60 111/60  Pulse: 112 107 106 104  Temp:      TempSrc:      Resp: 27 24 21 23   SpO2: 98% 97% 97% 100%    General:  NAD, resting comfortably in bed Eyes: PEERLA EOMI ENT: mucous membranes moist Neck: supple w/o JVD Cardiovascular: RRR  w/o MRG Respiratory: CTA B Abdomen: soft, nt, nd, bs+ Skin: no rash nor lesion Musculoskeletal: MAE, full ROM all 4 extremities Psychiatric: normal tone and affect Neurologic: AAOx3, grossly non-focal  Labs on Admission:  Basic Metabolic Panel:  Lab 10/06/12 2130  NA 138  K 3.5  CL 102  CO2 25  GLUCOSE 102*  BUN 9  CREATININE 0.57  CALCIUM 9.4  MG --  PHOS --   Liver Function Tests:  Lab 10/06/12 2035  AST 23  ALT 19  ALKPHOS 89  BILITOT 1.4*  PROT 7.5  ALBUMIN 4.3   No results found for this basename: LIPASE:5,AMYLASE:5 in the last 168 hours No results found for this basename: AMMONIA:5 in the last 168  hours CBC:  Lab 10/06/12 2035  WBC 12.2*  NEUTROABS --  HGB 8.1*  HCT 23.6*  MCV 102.6*  PLT 326   Cardiac Enzymes:  Lab 10/06/12 2035  CKTOTAL --  CKMB --  CKMBINDEX --  TROPONINI <0.30    BNP (last 3 results) No results found for this basename: PROBNP:3 in the last 8760 hours CBG: No results found for this basename: GLUCAP:5 in the last 168 hours  Radiological Exams on Admission: Dg Chest 2 View  10/06/2012  *RADIOLOGY REPORT*  Clinical Data: Chest pain with sickle cell crisis.  CHEST - 2 VIEW  Comparison: 09/22/2012  Findings: Two views of the chest demonstrate a left jugular Port-A- Cath.  Catheter tip is near the SVC/right atrium junction.  There are decreased lung volumes with vascular crowding.  No focal airspace disease.  Heart size is normal.  No evidence for pleural effusions. Left shoulder replacement.  IMPRESSION: Low lung volumes without focal disease.   Original Report Authenticated By: Richarda Overlie, M.D.     EKG: Independently reviewed.  Assessment/Plan Principal Problem:  *Sickle cell pain crisis   1. Admitting patient to sickle cell center - no evidence of acute chest syndrome, will still trend troponin's just in case as well but feel that these are unlikely to suddenly elevate at this point given the 48 hour history of unchanging pain symptoms, pain control with scheduled home dose of MS contin 60q12, and IV dilaudid 1-2 Q2h prn.  Sickle cell service likely to take over care in AM.  HGB 8.1 this appears to be about his baseline from recent lab work. He does have a very mild leukocytosis of 12.2 but appears to have this in the past.  Retic count is elevated today at 11.6.   Code Status: Full Code (must indicate code status--if unknown or must be presumed, indicate so) Family Communication: No family in room (indicate person spoken with, if applicable, with phone number if by telephone) Disposition Plan: Admit to [facility] (indicate anticipated LOS)  Time spent: 70  min  Inell Mimbs M. [facility] Pager [phone]  If 7PM-7AM, please contact night-coverage [email or web address] Password [facility] 10/07/2012, 12:19 AM

## 2012-10-07 NOTE — ED Notes (Signed)
Hospitalist, Gardner DO at bedside speaking with pt

## 2012-10-07 NOTE — Care Management (Signed)
Chart reviewed.  Lashonna Rieke,Rn,BSN [phone] 

## 2012-10-07 NOTE — ED Provider Notes (Signed)
Medical screening examination/treatment/procedure(s) were performed by non-physician practitioner and as supervising physician I was immediately available for consultation/collaboration.   Laray Anger, DO 10/07/12 1555

## 2012-10-07 NOTE — Progress Notes (Signed)
Subjective: This is a follow up to admission from this morning. Pt states that pain is 8/10 and localized to left side and legs. Pain is sharp and non-radiating. Pt has no established provider in the area and thus ran out of medications for 3 days. I've advised patient to continue with his previous provider until he has established care in this area. Objective:  10/07/12 0651 10/07/12 0715 10/07/12 0925  BP: 100/58 101/64 104/57  Pulse: 80  81  Temp:   98.2 F (36.8 C)  TempSrc:   Oral  Resp:     Height:     Weight:     SpO2:   96%   Weight change:   Intake/Output Summary (Last 24 hours) at 10/07/12 1746 Last data filed at 10/07/12 1552  Gross per 24 hour  Intake 2264.5 ml  Output   1450 ml  Net  814.5 ml    General: Alert, awake, oriented x3, in mild distress.  HEENT: [place]/AT PEERL, EOMI, Anicteric OROPHARYNX:  Moist, No exudate/ erythema/lesions.  Heart: Regular rate and rhythm, without murmurs, rubs, gallops.  Lungs: Clear to auscultation, no wheezing or rhonchi noted.  Abdomen: Soft, nontender, nondistended, positive bowel sounds.  Neuro: No focal neurological deficits noted  Musculoskeletal: No warm swelling or erythema around joints, no spinal tenderness noted.   Lab Results:  [facility] 10/06/12 2035  NA 138  K 3.5  CL 102  CO2 25  GLUCOSE 102*  BUN 9  CREATININE 0.57  CALCIUM 9.4  MG --  PHOS --    Basename 10/06/12 2035  AST 23  ALT 19  ALKPHOS 89  BILITOT 1.4*  PROT 7.5  ALBUMIN 4.3   No results found for this basename: LIPASE:2,AMYLASE:2 in the last 72 hours  Basename 10/06/12 2035  WBC 12.2*  NEUTROABS --  HGB 8.1*  HCT 23.6*  MCV 102.6*  PLT 326    Basename 10/07/12 1410 10/07/12 0815 10/07/12 0120  CKTOTAL -- -- --  CKMB -- -- --  CKMBINDEX -- -- --  TROPONINI <0.30 <0.30 <0.30   No components found with this basename: POCBNP:3 No results found for this basename: DDIMER:2 in the last 72 hours No results found for this basename:  HGBA1C:2 in the last 72 hours No results found for this basename: CHOL:2,HDL:2,LDLCALC:2,TRIG:2,CHOLHDL:2,LDLDIRECT:2 in the last 72 hours No results found for this basename: TSH,T4TOTAL,FREET3,T3FREE,THYROIDAB in the last 72 hours  Basename 10/06/12 2035  VITAMINB12 --  FOLATE --  FERRITIN --  TIBC --  IRON --  RETICCTPCT 11.6*    Micro Results: No results found for this or any previous visit (from the past 240 hour(s)).  Studies/Results: Dg Chest 2 View  10/06/2012  *RADIOLOGY REPORT*  Clinical Data: Chest pain with sickle cell crisis.  CHEST - 2 VIEW  Comparison: 09/22/2012  Findings: Two views of the chest demonstrate a left jugular Port-A- Cath.  Catheter tip is near the SVC/right atrium junction.  There are decreased lung volumes with vascular crowding.  No focal airspace disease.  Heart size is normal.  No evidence for pleural effusions. Left shoulder replacement.  IMPRESSION: Low lung volumes without focal disease.   Original Report Authenticated By: Richarda Overlie, M.D.    Dg Chest 2 View  09/22/2012  *RADIOLOGY REPORT*  Clinical Data: Sickle cell crisis, shortness of breath, fever, cough, congestion, body aches, history Port-A-Cath, hypertension  CHEST - 2 VIEW  Comparison: 09/16/2012  Findings: Left jugular dual lumen Port-A-Cath unchanged tip projecting over cavoatrial junction. Upper normal heart  size. Slight pulmonary vascular congestion. Stable mediastinal contours. Lungs grossly clear. No pleural effusion or pneumothorax. Left shoulder joint replacement.  IMPRESSION: No acute abnormalities.   Original Report Authenticated By: Ulyses Southward, M.D.    Dg Chest 2 View  09/16/2012  *RADIOLOGY REPORT*  Clinical Data: Sickle cell crisis, low back pain  CHEST - 2 VIEW  Comparison: 09/08/2012  Findings: The left chest wall port is again noted.  Left shoulder replacement is also again seen.  The heart and pulmonary vascularity are within normal limits.  No focal infiltrate or effusion is noted.   The upper abdomen is unremarkable.  No acute bony abnormality is seen.  IMPRESSION: No acute abnormality noted.   Original Report Authenticated By: Alcide Clever, M.D.    Dg Chest 2 View  09/08/2012  *RADIOLOGY REPORT*  Clinical Data: Sickle cell crisis.  Chest pain and cough and fever  CHEST - 2 VIEW  Comparison: 08/26/2012  Findings: Heart size is mildly enlarged.  Port-A-Cath unchanged with the tip at the cavoatrial junction.  Prominent lung markings appear chronic and unchanged.  No superimposed edema or pneumonia. Negative for pleural effusion.  Left shoulder replacement.  IMPRESSION: No acute cardiopulmonary abnormality and no change from the prior study.   Original Report Authenticated By: Janeece Riggers, M.D.     Medications: I have reviewed the patient's current medications. Scheduled Meds:   . folic acid  1 mg Oral Daily  .  HYDROmorphone (DILAUDID) injection  4 mg Intravenous Q2H  . hydroxyurea  2,000 mg Oral Daily  . ketorolac  30 mg Intravenous Q6H  . morphine  60 mg Oral BID   Continuous Infusions:   . sodium chloride 125 mL/hr at 10/07/12 1552   PRN Meds:.albuterol, diphenhydrAMINE, ondansetron (ZOFRAN) IV, senna-docusate Assessment/Plan: Patient Active Hospital Problem List: Sickle cell pain crisis (08/16/2012)   Assessment: Will increase dilaudid to 4 mg IV q 2 hours. If pain relief not lasting for  Hours then transition to PCA.    Leukocytosis   Assessment: Likely secondary to VOC.   LOS: 1 day

## 2012-10-08 LAB — CBC WITH DIFFERENTIAL/PLATELET
Basophils Absolute: 0 10*3/uL (ref 0.0–0.1)
Eosinophils Relative: 1 % (ref 0–5)
Lymphocytes Relative: 63 % — ABNORMAL HIGH (ref 12–46)
MCV: 105.6 fL — ABNORMAL HIGH (ref 78.0–100.0)
Monocytes Relative: 11 % (ref 3–12)
Neutrophils Relative %: 25 % — ABNORMAL LOW (ref 43–77)
Platelets: 295 10*3/uL (ref 150–400)
RBC: 1.98 MIL/uL — ABNORMAL LOW (ref 4.22–5.81)
WBC: 10 10*3/uL (ref 4.0–10.5)

## 2012-10-08 LAB — COMPREHENSIVE METABOLIC PANEL
ALT: 12 U/L (ref 0–53)
AST: 17 U/L (ref 0–37)
Calcium: 8 mg/dL — ABNORMAL LOW (ref 8.4–10.5)
Sodium: 141 mEq/L (ref 135–145)
Total Protein: 5.9 g/dL — ABNORMAL LOW (ref 6.0–8.3)

## 2012-10-08 LAB — MAGNESIUM: Magnesium: 2.3 mg/dL (ref 1.5–2.5)

## 2012-10-08 MED ORDER — SODIUM CHLORIDE 0.45 % IV SOLN: INTRAVENOUS | Status: DC

## 2012-10-08 NOTE — Progress Notes (Signed)
Subjective: Patient states that today his pain is 7/10. Pain is sharp and non-radiating. Pt has no established provider in the area and thus ran out of medications for 3 days. I've advised patient to continue with his previous provider until he has established care in this area. Objective:  10/07/12 0651 10/07/12 0715 10/07/12 0925  BP: 100/58 101/64 104/57  Pulse: 80  81  Temp:   98.2 F (36.8 C)  TempSrc:   Oral  Resp:     Height:     Weight:     SpO2:   96%   Weight change: 1.6 kg (3 lb 8.4 oz)  Intake/Output Summary (Last 24 hours) at 10/08/12 1155 Last data filed at 10/08/12 0900  Gross per 24 hour  Intake   1817 ml  Output   2725 ml  Net   -908 ml    General: Alert, awake, oriented x3, in mild distress.  HEENT: [place]/AT PEERL, EOMI, Anicteric OROPHARYNX:  Moist, No exudate/ erythema/lesions.  Heart: Regular rate and rhythm, without murmurs, rubs, gallops.  Lungs: Clear to auscultation, no wheezing or rhonchi noted.  Abdomen: Soft, nontender, nondistended, positive bowel sounds.  Neuro: No focal neurological deficits noted  Musculoskeletal: No warm swelling or erythema around joints, no spinal tenderness noted.   Lab Results:  [facility] 10/08/12 0445 10/06/12 2035  NA 141 138  K 3.9 3.5  CL 110 102  CO2 27 25  GLUCOSE 102* 102*  BUN 11 9  CREATININE 0.65 0.57  CALCIUM 8.0* 9.4  MG 2.3 --  PHOS -- --    Basename 10/08/12 0445 10/06/12 2035  AST 17 23  ALT 12 19  ALKPHOS 77 89  BILITOT 1.2 1.4*  PROT 5.9* 7.5  ALBUMIN 3.3* 4.3   No results found for this basename: LIPASE:2,AMYLASE:2 in the last 72 hours  Basename 10/08/12 0445 10/06/12 2035  WBC 10.0 12.2*  NEUTROABS 2.5 --  HGB 7.2* 8.1*  HCT 20.9* 23.6*  MCV 105.6* 102.6*  PLT 295 326    Basename 10/07/12 1410 10/07/12 0815 10/07/12 0120  CKTOTAL -- -- --  CKMB -- -- --  CKMBINDEX -- -- --  TROPONINI <0.30 <0.30 <0.30    hours No results found for this basename:  TSH,T4TOTAL,FREET3,T3FREE,THYROIDAB in the last 72 hours  Basename 10/06/12 2035  VITAMINB12 --  FOLATE --  FERRITIN --  TIBC --  IRON --  RETICCTPCT 11.6*    Studies/Results: Dg Chest 2 View  10/06/2012  *RADIOLOGY REPORT*  Clinical Data: Chest pain with sickle cell crisis.  CHEST - 2 VIEW  Comparison: 09/22/2012  Findings: Two views of the chest demonstrate a left jugular Port-A- Cath.  Catheter tip is near the SVC/right atrium junction.  There are decreased lung volumes with vascular crowding.  No focal airspace disease.  Heart size is normal.  No evidence for pleural effusions. Left shoulder replacement.  IMPRESSION: Low lung volumes without focal disease.   Original Report Authenticated By: Richarda Overlie, M.D.    Dg Chest 2 View  09/22/2012  *RADIOLOGY REPORT*  Clinical Data: Sickle cell crisis, shortness of breath, fever, cough, congestion, body aches, history Port-A-Cath, hypertension  CHEST - 2 VIEW  Comparison: 09/16/2012  Findings: Left jugular dual lumen Port-A-Cath unchanged tip projecting over cavoatrial junction. Upper normal heart size. Slight pulmonary vascular congestion. Stable mediastinal contours. Lungs grossly clear. No pleural effusion or pneumothorax. Left shoulder joint replacement.  IMPRESSION: No acute abnormalities.   Original Report Authenticated By: Ulyses Southward, M.D.  Dg Chest 2 View  09/16/2012  *RADIOLOGY REPORT*  Clinical Data: Sickle cell crisis, low back pain  CHEST - 2 VIEW  Comparison: 09/08/2012  Findings: The left chest wall port is again noted.  Left shoulder replacement is also again seen.  The heart and pulmonary vascularity are within normal limits.  No focal infiltrate or effusion is noted.  The upper abdomen is unremarkable.  No acute bony abnormality is seen.  IMPRESSION: No acute abnormality noted.   Original Report Authenticated By: Alcide Clever, M.D.    Dg Chest 2 View  09/08/2012  *RADIOLOGY REPORT*  Clinical Data: Sickle cell crisis.  Chest pain and  cough and fever  CHEST - 2 VIEW  Comparison: 08/26/2012  Findings: Heart size is mildly enlarged.  Port-A-Cath unchanged with the tip at the cavoatrial junction.  Prominent lung markings appear chronic and unchanged.  No superimposed edema or pneumonia. Negative for pleural effusion.  Left shoulder replacement.  IMPRESSION: No acute cardiopulmonary abnormality and no change from the prior study.   Original Report Authenticated By: Janeece Riggers, M.D.     Medications: I have reviewed the patient's current medications. Scheduled Meds:    . folic acid  1 mg Oral Daily  .  HYDROmorphone (DILAUDID) injection  4 mg Intravenous Q2H  . hydroxyurea  2,000 mg Oral Daily  . ketorolac  30 mg Intravenous Q6H  . morphine  60 mg Oral BID   Continuous Infusions:    . sodium chloride     PRN Meds:.albuterol, diphenhydrAMINE, ondansetron (ZOFRAN) IV, senna-docusate Assessment/Plan: Patient Active Hospital Problem List: Sickle cell pain crisis (08/16/2012) Continue dilaudid to 4 mg IV q 2 hours. Patient feel that his pain medication is  lasting and he is improving.    Leukocytosis   Assessment: Likely secondary to VOC. Resolved   LOS: 2 days

## 2012-10-09 DIAGNOSIS — D571 Sickle-cell disease without crisis: Secondary | ICD-10-CM

## 2012-10-09 LAB — CBC
MCH: 35.4 pg — ABNORMAL HIGH (ref 26.0–34.0)
Platelets: 369 10*3/uL (ref 150–400)
RBC: 2.09 MIL/uL — ABNORMAL LOW (ref 4.22–5.81)
WBC: 8.3 10*3/uL (ref 4.0–10.5)

## 2012-10-09 MED ORDER — MORPHINE SULFATE 15 MG PO TABS: 15.0000 mg | ORAL_TABLET | ORAL | Status: DC | PRN

## 2012-10-09 MED ORDER — HEPARIN SOD (PORK) LOCK FLUSH 100 UNIT/ML IV SOLN
INTRAVENOUS | Status: AC
  Filled 2012-10-09: qty 5

## 2012-10-09 MED ORDER — PROMETHAZINE HCL 25 MG PO TABS: 25.0000 mg | ORAL_TABLET | Freq: Four times a day (QID) | ORAL | Status: DC | PRN

## 2012-10-09 MED ORDER — MORPHINE SULFATE ER 60 MG PO TBCR: 60.0000 mg | EXTENDED_RELEASE_TABLET | Freq: Two times a day (BID) | ORAL | Status: DC

## 2012-10-09 NOTE — Discharge Summary (Signed)
. Physician Discharge Summary  Dennis Bernard ZOX:[id number] DOB: 11-Sep-1986 DOA: 10/06/2012  PCP: None  Admit date: 10/06/2012 Discharge date: 10/09/2012  Recommendations for Outpatient Follow-up:   Follow-up Information    Follow up with Levert Feinstein, MD. In 2 weeks.   Contact information:   [address] [place] [address] [phone]         Discharge Diagnoses:  1.  Sickle cell pain crisis 2. Leukocytosis 3. Anemia  Discharge Condition: Stable Disposition: Home   Diet recommendation:Regular  Filed Weights   10/07/12 0240 10/08/12 0629  Weight: 81.2 kg (179 lb 0.2 oz) 82.8 kg (182 lb 8.7 oz)    History of present illness:  Dennis Bernard is a 27 y.o. male with frequent SSC last DC'd from hospital 09/25/12, new episode of pain started 2 days ago, called Dr. [name] but did not go to clinic or set up appointment. Took home meds without relief. Episode today includes chest tightness, never had chest syndrome before but has had chest pain with sickle cell disease before. Including on his recent December admission.  Lab workup in the ED revealed normal troponin, no evidence of acute chest syndrome on CXR. Pain not relieved with IV dilaudid, so hospitalist asked to admit.      Hospital Course:  Sickle cell pain crisis  Patient was treated with dilaudid 4 mg IV q 2 hours. Patient states today that his pain is down to 4-5. He states that this is his baseline pain. He was then discharged home with  MS Contin scheduled and when necessary MSIR. He states that he had an appointment to see Dr. [name] but he was told that Dr. [name] is not taking any new patients and he just recently moved to the area. Gave him the number for hematology.  He will call to schedule an appointment with the hematologist.   Leukocytosis   Likely secondary to VOC. Resolved  Prior to discharge.  Anemia Hemoglobin is 7.4 and stable. That can be followed outpatient.      Medication List       As of 10/09/2012  9:33 AM    TAKE these medications         albuterol 108 (90 BASE) MCG/ACT inhaler   Commonly known as: PROVENTIL HFA;VENTOLIN HFA   Inhale 2 puffs into the lungs every 6 (six) hours as needed. wheezing      diphenhydrAMINE 25 MG tablet   Commonly known as: BENADRYL   Take 25 mg by mouth every 8 (eight) hours as needed. For itching.      folic acid 1 MG tablet   Commonly known as: FOLVITE   Take 1 mg by mouth daily.      hydroxyurea 500 MG capsule   Commonly known as: HYDREA   Take 2,000 mg by mouth daily. May take with food to minimize GI side effects.      morphine 60 MG 12 hr tablet   Commonly known as: MS CONTIN   Take 1 tablet (60 mg total) by mouth 2 (two) times daily.      morphine 15 MG tablet   Commonly known as: MSIR   Take 1 tablet (15 mg total) by mouth every 4 (four) hours as needed. Pain      promethazine 25 MG tablet   Commonly known as: PHENERGAN   Take 1 tablet (25 mg total) by mouth every 6 (six) hours as needed. Nausea      senna-docusate 8.6-50 MG per tablet  Commonly known as: Senokot-S   Take 1 tablet by mouth daily as needed. Constipation           Follow-up Information    Follow up with Levert Feinstein, MD. In 2 weeks.   Contact information:   [address] [place] [address] [phone]           The results of significant diagnostics from this hospitalization (including imaging, microbiology, ancillary and laboratory) are listed below for reference.    Significant Diagnostic Studies: Dg Chest 2 View  10/06/2012  *RADIOLOGY REPORT*  Clinical Data: Chest pain with sickle cell crisis.  CHEST - 2 VIEW  Comparison: 09/22/2012  Findings: Two views of the chest demonstrate a left jugular Port-A- Cath.  Catheter tip is near the SVC/right atrium junction.  There are decreased lung volumes with vascular crowding.  No focal airspace disease.  Heart size is normal.  No evidence for pleural effusions. Left shoulder  replacement.  IMPRESSION: Low lung volumes without focal disease.   Original Report Authenticated By: Richarda Overlie, M.D.    Dg Chest 2 View  09/22/2012  *RADIOLOGY REPORT*  Clinical Data: Sickle cell crisis, shortness of breath, fever, cough, congestion, body aches, history Port-A-Cath, hypertension  CHEST - 2 VIEW  Comparison: 09/16/2012  Findings: Left jugular dual lumen Port-A-Cath unchanged tip projecting over cavoatrial junction. Upper normal heart size. Slight pulmonary vascular congestion. Stable mediastinal contours. Lungs grossly clear. No pleural effusion or pneumothorax. Left shoulder joint replacement.  IMPRESSION: No acute abnormalities.   Original Report Authenticated By: Ulyses Southward, M.D.    Dg Chest 2 View  09/16/2012  *RADIOLOGY REPORT*  Clinical Data: Sickle cell crisis, low back pain  CHEST - 2 VIEW  Comparison: 09/08/2012  Findings: The left chest wall port is again noted.  Left shoulder replacement is also again seen.  The heart and pulmonary vascularity are within normal limits.  No focal infiltrate or effusion is noted.  The upper abdomen is unremarkable.  No acute bony abnormality is seen.  IMPRESSION: No acute abnormality noted.   Original Report Authenticated By: Alcide Clever, M.D.      Labs: Basic Metabolic Panel:  Lab 10/08/12 9147 10/06/12 2035  NA 141 138  K 3.9 3.5  CL 110 102  CO2 27 25  GLUCOSE 102* 102*  BUN 11 9  CREATININE 0.65 0.57  CALCIUM 8.0* 9.4  MG 2.3 --  PHOS -- --   Liver Function Tests:  Lab 10/08/12 0445 10/06/12 2035  AST 17 23  ALT 12 19  ALKPHOS 77 89  BILITOT 1.2 1.4*  PROT 5.9* 7.5  ALBUMIN 3.3* 4.3   CBC:  Lab 10/09/12 0530 10/08/12 0445 10/06/12 2035  WBC 8.3 10.0 12.2*  NEUTROABS -- 2.5 --  HGB 7.4* 7.2* 8.1*  HCT 21.8* 20.9* 23.6*  MCV 104.3* 105.6* 102.6*  PLT 369 295 326   Cardiac Enzymes:  Lab 10/07/12 1410 10/07/12 0815 10/07/12 0120 10/06/12 2035  CKTOTAL -- -- -- --  CKMB -- -- -- --  CKMBINDEX -- -- -- --    TROPONINI <0.30 <0.30 <0.30 <0.30    Time coordinating discharge: 45 min  Signed:  Molli Posey, MD Sickle Cell Service Pager [phone] 10/09/2012, 9:33 AM

## 2012-10-15 ENCOUNTER — Ambulatory Visit: Payer: Self-pay | Admitting: Oncology

## 2012-10-17 LAB — COMPREHENSIVE METABOLIC PANEL
Anion Gap: 7 (ref 7–16)
BUN: 13 mg/dL (ref 7–18)
Bilirubin,Total: 1.8 mg/dL — ABNORMAL HIGH (ref 0.2–1.0)
Calcium, Total: 9.3 mg/dL (ref 8.5–10.1)
Co2: 25 mmol/L (ref 21–32)
Creatinine: 0.59 mg/dL — ABNORMAL LOW (ref 0.60–1.30)
EGFR (African American): >60
EGFR (Non-African Amer.): >60
Glucose: 107 mg/dL — ABNORMAL HIGH (ref 65–99)
Osmolality: 278 (ref 275–301)
Total Protein: 8.7 g/dL — ABNORMAL HIGH (ref 6.4–8.2)

## 2012-10-17 LAB — RETICULOCYTES: Reticulocyte: 4.8 % — ABNORMAL HIGH (ref 0.7–2.5)

## 2012-10-17 LAB — CBC
MCV: 107 fL — ABNORMAL HIGH (ref 80–100)
RBC: 2.78 10*6/uL — ABNORMAL LOW (ref 4.40–5.90)
WBC: 8.8 10*3/uL (ref 3.8–10.6)

## 2012-10-17 LAB — LACTATE DEHYDROGENASE: LDH: 319 U/L — ABNORMAL HIGH (ref 85–241)

## 2012-10-18 LAB — CBC WITH DIFFERENTIAL/PLATELET
Bands: 1 %
Basophil: 1 %
Eosinophil: 1 %
HCT: 29.8 % — ABNORMAL LOW (ref 40.0–52.0)
Lymphocytes: 55 %
MCHC: 33.5 g/dL (ref 32.0–36.0)
MCV: 108 fL — ABNORMAL HIGH (ref 80–100)
Monocytes: 6 %
NRBC/100 WBC: 3 /
Platelet: 335 10*3/uL (ref 150–440)
RBC: 2.76 10*6/uL — ABNORMAL LOW (ref 4.40–5.90)
RDW: 15.5 % — ABNORMAL HIGH (ref 11.5–14.5)
WBC: 7.3 10*3/uL (ref 3.8–10.6)

## 2012-10-19 ENCOUNTER — Inpatient Hospital Stay: Payer: Self-pay | Admitting: Internal Medicine

## 2012-10-20 ENCOUNTER — Emergency Department (HOSPITAL_COMMUNITY)
Admission: EM | Admit: 2012-10-20 | Discharge: 2012-10-20 | Discharge status: Against Medical Advice | Payer: Medicare Other | Source: Home / Self Care

## 2012-10-20 ENCOUNTER — Encounter (HOSPITAL_COMMUNITY): Payer: Self-pay | Admitting: Emergency Medicine

## 2012-10-20 DIAGNOSIS — R509 Fever, unspecified: Secondary | ICD-10-CM | POA: Insufficient documentation

## 2012-10-20 NOTE — ED Notes (Signed)
Pt states since last night he has been having pain gerneralized and in lt shoulder with fever and flu like sx. States took pain meds last night none today. Also states that this is the same pain as his sickle cell crisis

## 2012-10-20 NOTE — ED Notes (Signed)
1st attempt to call pt to be taken to a room. Pt not present, no response.

## 2012-10-21 ENCOUNTER — Encounter (HOSPITAL_COMMUNITY): Payer: Self-pay | Admitting: Emergency Medicine

## 2012-10-21 ENCOUNTER — Emergency Department (HOSPITAL_COMMUNITY): Payer: Medicare Other

## 2012-10-21 ENCOUNTER — Inpatient Hospital Stay (HOSPITAL_COMMUNITY)
Admission: EM | Admit: 2012-10-21 | Discharge: 2012-10-23 | DRG: 812 | Disposition: A | Discharge status: Home / Self Care | Payer: Medicare Other | Attending: Internal Medicine | Admitting: Internal Medicine

## 2012-10-21 DIAGNOSIS — G894 Chronic pain syndrome: Secondary | ICD-10-CM | POA: Diagnosis present

## 2012-10-21 DIAGNOSIS — D5700 Hb-SS disease with crisis, unspecified: Principal | ICD-10-CM | POA: Diagnosis present

## 2012-10-21 DIAGNOSIS — E876 Hypokalemia: Secondary | ICD-10-CM

## 2012-10-21 DIAGNOSIS — Z87891 Personal history of nicotine dependence: Secondary | ICD-10-CM

## 2012-10-21 DIAGNOSIS — D571 Sickle-cell disease without crisis: Secondary | ICD-10-CM

## 2012-10-21 DIAGNOSIS — Z79899 Other long term (current) drug therapy: Secondary | ICD-10-CM

## 2012-10-21 LAB — CBC WITH DIFFERENTIAL/PLATELET
Basophils Absolute: 0 10*3/uL (ref 0.0–0.1)
HCT: 24.6 % — ABNORMAL LOW (ref 39.0–52.0)
Hemoglobin: 8.4 g/dL — ABNORMAL LOW (ref 13.0–17.0)
Lymphocytes Relative: 51 % — ABNORMAL HIGH (ref 12–46)
Monocytes Absolute: 0.8 10*3/uL (ref 0.1–1.0)
Neutro Abs: 3.9 10*3/uL (ref 1.7–7.7)
RDW: 15.4 % (ref 11.5–15.5)
WBC: 9.8 10*3/uL (ref 4.0–10.5)

## 2012-10-21 LAB — COMPREHENSIVE METABOLIC PANEL
ALT: 11 U/L (ref 0–53)
AST: 23 U/L (ref 0–37)
Alkaline Phosphatase: 74 U/L (ref 39–117)
CO2: 24 mEq/L (ref 19–32)
Chloride: 107 mEq/L (ref 96–112)
Creatinine, Ser: 0.72 mg/dL (ref 0.50–1.35)
GFR calc non Af Amer: >90 mL/min (ref >90)
Total Bilirubin: 1.5 mg/dL — ABNORMAL HIGH (ref 0.3–1.2)

## 2012-10-21 LAB — RETICULOCYTES: RBC.: 2.48 MIL/uL — ABNORMAL LOW (ref 4.22–5.81)

## 2012-10-21 LAB — URINALYSIS, ROUTINE W REFLEX MICROSCOPIC
Bilirubin Urine: NEGATIVE
Glucose, UA: NEGATIVE mg/dL
Hgb urine dipstick: NEGATIVE
Specific Gravity, Urine: 1.014 (ref 1.005–1.030)
pH: 6 (ref 5.0–8.0)

## 2012-10-21 MED ORDER — FOLIC ACID 1 MG PO TABS
1.0000 mg | ORAL_TABLET | Freq: Every day | ORAL | Status: DC
  Filled 2012-10-21: qty 1

## 2012-10-21 MED ORDER — ONDANSETRON HCL 4 MG/2ML IJ SOLN
4.0000 mg | Freq: Once | INTRAMUSCULAR | Status: AC
  Administered 2012-10-21: 4 mg via INTRAVENOUS
  Filled 2012-10-21: qty 2

## 2012-10-21 MED ORDER — HYDROXYUREA 500 MG PO CAPS
2000.0000 mg | ORAL_CAPSULE | Freq: Every day | ORAL | Status: DC
  Administered 2012-10-22 – 2012-10-23 (×2): 2000 mg via ORAL
  Filled 2012-10-21 (×3): qty 4

## 2012-10-21 MED ORDER — HYDROMORPHONE HCL PF 2 MG/ML IJ SOLN: 2.0000 mg | INTRAMUSCULAR | Status: DC | PRN

## 2012-10-21 MED ORDER — DIPHENHYDRAMINE HCL 50 MG/ML IJ SOLN: 12.5000 mg | Freq: Four times a day (QID) | INTRAMUSCULAR | Status: DC | PRN

## 2012-10-21 MED ORDER — ONDANSETRON HCL 4 MG/2ML IJ SOLN
4.0000 mg | Freq: Four times a day (QID) | INTRAMUSCULAR | Status: DC | PRN
  Administered 2012-10-23: 4 mg via INTRAVENOUS
  Filled 2012-10-21: qty 2

## 2012-10-21 MED ORDER — SODIUM CHLORIDE 0.9 % IV SOLN
INTRAVENOUS | Status: DC
  Administered 2012-10-21 – 2012-10-23 (×5): via INTRAVENOUS

## 2012-10-21 MED ORDER — HYDROMORPHONE HCL PF 1 MG/ML IJ SOLN
0.5000 mg | Freq: Once | INTRAMUSCULAR | Status: AC
  Administered 2012-10-21: 0.5 mg via INTRAVENOUS
  Filled 2012-10-21: qty 1

## 2012-10-21 MED ORDER — PROMETHAZINE HCL 25 MG PO TABS: 12.5000 mg | ORAL_TABLET | ORAL | Status: DC | PRN

## 2012-10-21 MED ORDER — DIPHENHYDRAMINE HCL 50 MG/ML IJ SOLN
12.5000 mg | INTRAMUSCULAR | Status: DC | PRN
  Administered 2012-10-21 – 2012-10-22 (×3): 25 mg via INTRAVENOUS
  Filled 2012-10-21 (×3): qty 1

## 2012-10-21 MED ORDER — DIPHENHYDRAMINE HCL 12.5 MG/5ML PO ELIX: 12.5000 mg | ORAL_SOLUTION | Freq: Four times a day (QID) | ORAL | Status: DC | PRN

## 2012-10-21 MED ORDER — PROMETHAZINE HCL 12.5 MG RE SUPP
12.5000 mg | RECTAL | Status: DC | PRN
  Filled 2012-10-21: qty 2

## 2012-10-21 MED ORDER — SENNOSIDES-DOCUSATE SODIUM 8.6-50 MG PO TABS
1.0000 | ORAL_TABLET | Freq: Every day | ORAL | Status: DC | PRN
  Filled 2012-10-21: qty 1

## 2012-10-21 MED ORDER — MORPHINE SULFATE 15 MG PO TABS
15.0000 mg | ORAL_TABLET | ORAL | Status: DC | PRN
  Administered 2012-10-21 – 2012-10-23 (×5): 15 mg via ORAL
  Filled 2012-10-21 (×5): qty 1

## 2012-10-21 MED ORDER — FOLIC ACID 1 MG PO TABS
1.0000 mg | ORAL_TABLET | Freq: Every day | ORAL | Status: DC
  Administered 2012-10-22 – 2012-10-23 (×2): 1 mg via ORAL
  Filled 2012-10-21 (×3): qty 1

## 2012-10-21 MED ORDER — DIPHENHYDRAMINE HCL 50 MG/ML IJ SOLN
25.0000 mg | Freq: Once | INTRAMUSCULAR | Status: AC
  Administered 2012-10-21: 25 mg via INTRAVENOUS
  Filled 2012-10-21: qty 1

## 2012-10-21 MED ORDER — HYDROMORPHONE HCL PF 1 MG/ML IJ SOLN
1.0000 mg | Freq: Once | INTRAMUSCULAR | Status: AC
  Administered 2012-10-21: 1 mg via INTRAVENOUS
  Filled 2012-10-21: qty 1

## 2012-10-21 MED ORDER — DIPHENHYDRAMINE HCL 25 MG PO CAPS
25.0000 mg | ORAL_CAPSULE | ORAL | Status: DC | PRN
  Administered 2012-10-22 – 2012-10-23 (×2): 50 mg via ORAL
  Filled 2012-10-21 (×2): qty 2

## 2012-10-21 MED ORDER — MORPHINE SULFATE ER 30 MG PO TBCR
60.0000 mg | EXTENDED_RELEASE_TABLET | Freq: Two times a day (BID) | ORAL | Status: DC
  Administered 2012-10-21 – 2012-10-23 (×4): 60 mg via ORAL
  Filled 2012-10-21 (×4): qty 2

## 2012-10-21 MED ORDER — NALOXONE HCL 0.4 MG/ML IJ SOLN: 0.4000 mg | INTRAMUSCULAR | Status: DC | PRN

## 2012-10-21 MED ORDER — ALBUTEROL SULFATE HFA 108 (90 BASE) MCG/ACT IN AERS: 2.0000 | INHALATION_SPRAY | Freq: Four times a day (QID) | RESPIRATORY_TRACT | Status: DC | PRN

## 2012-10-21 MED ORDER — SODIUM CHLORIDE 0.9 % IJ SOLN: 9.0000 mL | INTRAMUSCULAR | Status: DC | PRN

## 2012-10-21 MED ORDER — ACETAMINOPHEN 500 MG PO TABS: 500.0000 mg | ORAL_TABLET | Freq: Four times a day (QID) | ORAL | Status: DC | PRN

## 2012-10-21 MED ORDER — SODIUM CHLORIDE 0.9 % IV SOLN
Freq: Once | INTRAVENOUS | Status: AC
  Administered 2012-10-21: 14:00:00 via INTRAVENOUS

## 2012-10-21 MED ORDER — HYDROMORPHONE 0.3 MG/ML IV SOLN
INTRAVENOUS | Status: DC
  Administered 2012-10-21: 3.6 mg via INTRAVENOUS
  Administered 2012-10-21 – 2012-10-22 (×2): via INTRAVENOUS
  Administered 2012-10-22: 1.2 mg via INTRAVENOUS
  Administered 2012-10-22: 3 mg via INTRAVENOUS
  Administered 2012-10-22: 4.23 mg via INTRAVENOUS
  Filled 2012-10-21 (×2): qty 25

## 2012-10-21 MED ORDER — HYDROMORPHONE HCL PF 2 MG/ML IJ SOLN
4.0000 mg | Freq: Once | INTRAMUSCULAR | Status: AC
  Administered 2012-10-21: 2 mg via INTRAVENOUS
  Filled 2012-10-21: qty 1

## 2012-10-21 NOTE — H&P (Signed)
[facility] History and Physical  Dennis Bernard AVW:[id number] DOB: 1986/08/27 DOA: 10/21/2012  Referring physician: ER physician PCP: Willey Blade, MD   Chief Complaint: generalized pain  HPI:  27 year old male with past medical history of sickle cell disease who presented to ED with worsening generalized pain, mostly in lower back and upper and lower extremities. Patient also reported subjective fever and cough at home, mostly dry. Patient reports no associated shortness of breath, palpitations or chest pain. No complaint sof abdominal pain, nausea or vomiting. No reports of blood in stool or urine. In ED, patient received total of 6 mg of IV dilaudid with no significant pain relief. [facility] asked to admit for further management of pain crisis.  Assessment and Plan:  Principal Problem: *Acute vaso occlusive sickle cell pain crisis  Continue supportive care with IV fluids and analgesics. We will start dilaudid PCA for better pain control.  Continue home analgesics: MS continue and PRN PO morphine  Continue benadryl PRN and phenergan PRN  Active Problems: Sickle cell anemia  Hemoglobin on admission 8.4  No signs of acute bleed  Continue to monitor CBC  DVT prophylaxis:   SCD's bilaterally  Encourage ambulation Diet:  Regular, as tolerated  [name] [facility] [phone]   Review of Systems:  Constitutional: positive  for fever, chills and malaise/fatigue. Negative for diaphoresis.  HENT: Negative for hearing loss, ear pain, nosebleeds, congestion, sore throat, neck pain, tinnitus and ear discharge.   Eyes: Negative for blurred vision, double vision, photophobia, pain, discharge and redness.  Respiratory: Negative for cough, hemoptysis, sputum production, shortness of breath, wheezing and stridor.   Cardiovascular: Negative for chest pain, palpitations, orthopnea, claudication and leg swelling.  Gastrointestinal: Negative for nausea, vomiting and abdominal pain.  Negative for heartburn, constipation, blood in stool and melena.  Genitourinary: Negative for dysuria, urgency, frequency, hematuria and flank pain.  Musculoskeletal: per HPI.  Skin: Negative for itching and rash.  Neurological: Negative for dizziness and weakness. Negative for tingling, tremors, sensory change, speech change, focal weakness, loss of consciousness and headaches.  Endo/Heme/Allergies: Negative for environmental allergies and polydipsia. Does not bruise/bleed easily.  Psychiatric/Behavioral: Negative for suicidal ideas. The patient is not nervous/anxious.      Past Medical History  Diagnosis Date  . Sickle cell disease   . Avascular necrosis of femur head, right   . H/O allergic rhinitis   . H/O hypokalemia   . Chronic pain syndrome    Past Surgical History  Procedure Date  . Cholecystectomy 1995  . Left shoulder arthroplasty 04/09/2008  . Left pac placement 07/04/2012  . Exchange transfusion 02/2008   Social History:  reports that he has quit smoking. His smoking use included Cigars. He has never used smokeless tobacco. He reports that he does not drink alcohol or use illicit drugs.  No Known Allergies  Family History:  Family History  Problem Relation Age of Onset  . Sickle cell anemia Brother   . Sickle cell trait Father   . Sickle cell trait Mother   . Diabetes Mellitus II Mother   . Sickle cell anemia Paternal Uncle      Prior to Admission medications   Medication Sig Start Date End Date Taking? Authorizing Provider  acetaminophen (TYLENOL) 500 MG tablet Take 500 mg by mouth every 6 (six) hours as needed. Fever.   Yes Historical Provider, MD  albuterol (PROVENTIL HFA;VENTOLIN HFA) 108 (90 BASE) MCG/ACT inhaler Inhale 2 puffs into the lungs every 6 (six) hours as needed.  wheezing 09/12/12  Yes Christina P Rama, MD  diphenhydrAMINE (BENADRYL) 25 MG tablet Take 25 mg by mouth every 8 (eight) hours as needed. For itching. 08/23/12  Yes Dow Adolph, MD   folic acid (FOLVITE) 1 MG tablet Take 1 mg by mouth daily.   Yes Historical Provider, MD  hydroxyurea (HYDREA) 500 MG capsule Take 2,000 mg by mouth daily. May take with food to minimize GI side effects.   Yes Historical Provider, MD  morphine (MS CONTIN) 60 MG 12 hr tablet Take 1 tablet (60 mg total) by mouth 2 (two) times daily. 10/09/12  Yes Novlet Adelina Mings, MD  morphine (MSIR) 15 MG tablet Take 1 tablet (15 mg total) by mouth every 4 (four) hours as needed. Pain 10/09/12  Yes Novlet Adelina Mings, MD  promethazine (PHENERGAN) 25 MG tablet Take 1 tablet (25 mg total) by mouth every 6 (six) hours as needed. Nausea 10/09/12  Yes Novlet Adelina Mings, MD  senna-docusate (SENOKOT-S) 8.6-50 MG per tablet Take 1 tablet by mouth daily as needed. Constipation 08/27/12  Yes Linward Headland, MD   Physical Exam: Filed Vitals:   10/21/12 1236  BP: 114/70  Pulse: 99  Temp: 98.5 F (36.9 C)  TempSrc: Oral  Resp: 20  SpO2: 100%    Physical Exam  Constitutional: Appears well-developed and well-nourished. No distress.  HENT: Normocephalic. External right and left ear normal. Oropharynx is clear and moist.  Eyes: Conjunctivae and EOM are normal. PERRLA, no scleral icterus.  Neck: Normal ROM. Neck supple. No JVD. No tracheal deviation. No thyromegaly.  CVS: RRR, S1/S2 +, no murmurs, no gallops, no carotid bruit.  Pulmonary: Effort and breath sounds normal, no stridor, rhonchi, wheezes, rales.  Abdominal: Soft. BS +,  no distension, tenderness, rebound or guarding.  Musculoskeletal: Normal range of motion. No edema and no tenderness.  Lymphadenopathy: No lymphadenopathy noted, cervical, inguinal. Neuro: Alert. Normal reflexes, muscle tone coordination. No cranial nerve deficit. Skin: Skin is warm and dry. No rash noted. Not diaphoretic. No erythema. No pallor.  Psychiatric: Normal mood and affect. Behavior, judgment, thought content normal.   Labs on Admission:  Basic Metabolic Panel:  Lab 10/21/12 0865  NA 142   K 3.6  CL 107  CO2 24  GLUCOSE 100*  BUN 12  CREATININE 0.72  CALCIUM 8.8   Liver Function Tests:  Lab 10/21/12 1330  AST 23  ALT 11  ALKPHOS 74  BILITOT 1.5*  PROT 7.1  ALBUMIN 4.1   CBC:  Lab 10/21/12 1330  WBC 9.8  NEUTROABS 3.9  HGB 8.4*  HCT 24.6*  MCV 99.2  PLT 314    Radiological Exams on Admission: Dg Chest 2 View 10/21/2012  * IMPRESSION: Stable chronic parenchymal scarring and vascular prominence of the lungs.  No acute findings.   Original Report Authenticated By: Irish Lack, M.D.     EKG: Normal sinus rhythm, no ST/T wave changes  Code Status: Full Family Communication: Pt at bedside Disposition Plan: Admit for further evaluation  [name], MD  [facility] Pager [phone]  If 7PM-7AM, please contact night-coverage [email or web address] Password [facility] 10/21/2012, 4:25 PM

## 2012-10-21 NOTE — ED Notes (Signed)
Patient transported to X-ray 

## 2012-10-21 NOTE — ED Provider Notes (Signed)
History     CSN: [id number]  Arrival date & time 10/21/12  1217   First MD Initiated Contact with Patient 10/21/12 1250      Chief Complaint  Patient presents with  . Pain  . Cough  . Fever    (Consider location/radiation/quality/duration/timing/severity/associated sxs/prior treatment) Patient is a 27 y.o. male presenting with cough and fever. The history is provided by the patient (the pt comlains of arm pain and cough). No language interpreter was used.  Cough This is a new problem. The current episode started 12 to 24 hours ago. The problem occurs constantly. The problem has not changed since onset.The cough is non-productive. The maximum temperature recorded prior to his arrival was 100 to 100.9 F. The fever has been present for 1 to 2 days. Pertinent negatives include no chest pain, no chills and no headaches. The treatment provided no relief.  Fever Primary symptoms of the febrile illness include fever and cough. Primary symptoms do not include fatigue, headaches, abdominal pain, diarrhea or rash.    Past Medical History  Diagnosis Date  . Sickle cell disease   . Avascular necrosis of femur head, right   . H/O allergic rhinitis   . H/O hypokalemia   . Chronic pain syndrome     Past Surgical History  Procedure Date  . Cholecystectomy 1995  . Left shoulder arthroplasty 04/09/2008  . Left pac placement 07/04/2012  . Exchange transfusion 02/2008    Family History  Problem Relation Age of Onset  . Sickle cell anemia Brother   . Sickle cell trait Father   . Sickle cell trait Mother   . Diabetes Mellitus II Mother   . Sickle cell anemia Paternal Uncle     History  Substance Use Topics  . Smoking status: Former Smoker    Types: Cigars  . Smokeless tobacco: Never Used  . Alcohol Use: No      Review of Systems  Constitutional: Positive for fever. Negative for chills and fatigue.  HENT: Negative for congestion, sinus pressure and ear discharge.   Eyes: Negative  for discharge.  Respiratory: Positive for cough.   Cardiovascular: Negative for chest pain.  Gastrointestinal: Negative for abdominal pain and diarrhea.  Genitourinary: Negative for frequency and hematuria.  Musculoskeletal: Negative for back pain.  Skin: Negative for rash.  Neurological: Negative for seizures and headaches.  Hematological: Negative.   Psychiatric/Behavioral: Negative for hallucinations.    Allergies  Review of patient's allergies indicates no known allergies.  Home Medications   Current Outpatient Rx  Name  Route  Sig  Dispense  Refill  . ACETAMINOPHEN 500 MG PO TABS   Oral   Take 500 mg by mouth every 6 (six) hours as needed. Fever.         . ALBUTEROL SULFATE HFA 108 (90 BASE) MCG/ACT IN AERS   Inhalation   Inhale 2 puffs into the lungs every 6 (six) hours as needed. wheezing         . DIPHENHYDRAMINE HCL 25 MG PO TABS   Oral   Take 25 mg by mouth every 8 (eight) hours as needed. For itching.         Marland Kitchen FOLIC ACID 1 MG PO TABS   Oral   Take 1 mg by mouth daily.         Marland Kitchen HYDROXYUREA 500 MG PO CAPS   Oral   Take 2,000 mg by mouth daily. May take with food to minimize GI side effects.         Marland Kitchen  MORPHINE SULFATE ER 60 MG PO TBCR   Oral   Take 1 tablet (60 mg total) by mouth 2 (two) times daily.   60 tablet   0   . MORPHINE SULFATE 15 MG PO TABS   Oral   Take 1 tablet (15 mg total) by mouth every 4 (four) hours as needed. Pain   60 tablet   0   . PROMETHAZINE HCL 25 MG PO TABS   Oral   Take 1 tablet (25 mg total) by mouth every 6 (six) hours as needed. Nausea   30 tablet   0   . SENNOSIDES-DOCUSATE SODIUM 8.6-50 MG PO TABS   Oral   Take 1 tablet by mouth daily as needed. Constipation           BP 114/70  Pulse 99  Temp 98.5 F (36.9 C) (Oral)  Resp 20  SpO2 100%  Physical Exam  Constitutional: He is oriented to person, place, and time. He appears well-developed.  HENT:  Head: Normocephalic and atraumatic.  Eyes:  Conjunctivae normal and EOM are normal. No scleral icterus.  Neck: Neck supple. No thyromegaly present.  Cardiovascular: Normal rate and regular rhythm.  Exam reveals no gallop and no friction rub.   No murmur heard. Pulmonary/Chest: No stridor. He has no wheezes. He has no rales. He exhibits no tenderness.  Abdominal: He exhibits no distension. There is no tenderness. There is no rebound.  Musculoskeletal: Normal range of motion. He exhibits no edema.  Lymphadenopathy:    He has no cervical adenopathy.  Neurological: He is oriented to person, place, and time. Coordination normal.  Skin: No rash noted. No erythema.  Psychiatric: He has a normal mood and affect. His behavior is normal.    ED Course  Procedures (including critical care time)  Labs Reviewed  CBC WITH DIFFERENTIAL - Abnormal; Notable for the following:    RBC 2.48 (*)     Hemoglobin 8.4 (*)     HCT 24.6 (*)     Neutrophils Relative 40 (*)     Lymphocytes Relative 51 (*)     Lymphs Abs 5.0 (*)     All other components within normal limits  COMPREHENSIVE METABOLIC PANEL - Abnormal; Notable for the following:    Glucose, Bld 100 (*)     Total Bilirubin 1.5 (*)     All other components within normal limits  RETICULOCYTES - Abnormal; Notable for the following:    Retic Ct Pct 3.3 (*)     RBC. 2.48 (*)     All other components within normal limits   Dg Chest 2 View  10/21/2012  *RADIOLOGY REPORT*  Clinical Data: Chest pain, nausea, fever and sickle cell crisis.  CHEST - 2 VIEW  Comparison: 10/06/2012  Findings: Stable positioning of left-sided dual lumen Port-A-Cath. The lungs show stable low volumes with scattered areas of parenchymal scarring.  Stable chronic mildly accentuated pulmonary vasculature without overt edema.  No focal pulmonary consolidation, edema, pneumothorax or pleural fluid is identified.  There is stable mild cardiac enlargement.  The bony thorax is unremarkable.  IMPRESSION: Stable chronic parenchymal  scarring and vascular prominence of the lungs.  No acute findings.   Original Report Authenticated By: Irish Lack, M.D.      No diagnosis found.    MDM         Benny Lennert, MD 10/21/12 [phone]

## 2012-10-21 NOTE — ED Notes (Signed)
Patient states he has had a cold x 2 weeks and has run a low grade fever up to 100 x 2 days.  Patient was afebrile upon arrival.  Patient c/o body aches, cough, and chest tightness.

## 2012-10-21 NOTE — ED Notes (Signed)
Pt came in yesterday but could not wait and returned today with n/v fever abd body aches states that it is the same sx of his sickle cell pain.

## 2012-10-22 DIAGNOSIS — E876 Hypokalemia: Secondary | ICD-10-CM

## 2012-10-22 MED ORDER — HYDROMORPHONE 0.3 MG/ML IV SOLN
INTRAVENOUS | Status: DC
  Administered 2012-10-22: 7.99 mg via INTRAVENOUS
  Administered 2012-10-22: 5.99 mg via INTRAVENOUS
  Administered 2012-10-22: 11.49 mg via INTRAVENOUS
  Administered 2012-10-22 (×3): via INTRAVENOUS
  Administered 2012-10-22: 9.29 mg via INTRAVENOUS
  Administered 2012-10-23 (×2): via INTRAVENOUS
  Administered 2012-10-23: 6.66 mg via INTRAVENOUS
  Administered 2012-10-23 (×2): via INTRAVENOUS
  Administered 2012-10-23: 9.49 mg via INTRAVENOUS
  Filled 2012-10-22 (×9): qty 25

## 2012-10-22 NOTE — Progress Notes (Signed)
[facility] PROGRESS NOTE  Patrik Turnbaugh WUJ:[id number] DOB: 23-May-1986 DOA: 10/21/2012 PCP: Willey Blade, MD  Assessment/Plan: Sickle cell crisis: Patient is a 27 year old male with history of sickle cell disease. Patient came in the sickle cell crisis for pain management.  Patient is presently on PCA. He states that the PCA at this current dose is not controlling his pain. Made adjustments to his PCA. Will reassess in 24 hours.  Code Status: Full Family Communication: nonoe Disposition Plan: Not yet ready for d/c   Molli Posey, MD  Sickle Cell Pager [phone].  If 7PM-7AM, please contact night-coverage at [email or web address], password [facility] 10/22/2012, 9:32 AM   LOS: 1 day   Brief narrative:   Consultants:  None  Procedures:  None  Antibiotics:  None ?  HPI/Subjective: Complains PCA dose is too low.  He is still in severe pain.  Objective: Filed Vitals:   10/22/12 0017 10/22/12 0400 10/22/12 0515 10/22/12 0800  BP:   101/69   Pulse:   73   Temp:   97.6 F (36.4 C)   TempSrc:   Oral   Resp: 17 16 16 16   Height:      Weight:   76.476 kg (168 lb 9.6 oz)   SpO2: 100% 99% 99% 99%    Intake/Output Summary (Last 24 hours) at 10/22/12 0932 Last data filed at 10/22/12 0800  Gross per 24 hour  Intake   1395 ml  Output    800 ml  Net    595 ml    Exam:   General:  Patient is in bed. He does not seem to be in any acute distress.  Cardiovascular: Slightly tachycardic.  Respiratory: Clear to auscultation bilaterally, no wheezes rhonchi or rales.  ABD: Soft nontender nondistended positive bowel sounds.  EXT: no edema.  Data Reviewed: Basic Metabolic Panel:  Recent Labs Lab 10/21/12 1330  NA 142  K 3.6  CL 107  CO2 24  GLUCOSE 100*  BUN 12  CREATININE 0.72  CALCIUM 8.8   Liver Function Tests:  Recent Labs Lab 10/21/12 1330  AST 23  ALT 11  ALKPHOS 74  BILITOT 1.5*  PROT 7.1  ALBUMIN 4.1    No results found for this basename:  AMMONIA,  in the last 168 hours CBC:  Recent Labs Lab 10/21/12 1330  WBC 9.8  NEUTROABS 3.9  HGB 8.4*  HCT 24.6*  MCV 99.2  PLT 314    Studies: Dg Chest 2 View  10/21/2012  *RADIOLOGY REPORT*  Clinical Data: Chest pain, nausea, fever and sickle cell crisis.  CHEST - 2 VIEW  Comparison: 10/06/2012  Findings: Stable positioning of left-sided dual lumen Port-A-Cath. The lungs show stable low volumes with scattered areas of parenchymal scarring.  Stable chronic mildly accentuated pulmonary vasculature without overt edema.  No focal pulmonary consolidation, edema, pneumothorax or pleural fluid is identified.  There is stable mild cardiac enlargement.  The bony thorax is unremarkable.  IMPRESSION: Stable chronic parenchymal scarring and vascular prominence of the lungs.  No acute findings.   Original Report Authenticated By: Irish Lack, M.D.    Dg Chest 2 View  10/06/2012  *RADIOLOGY REPORT*  Clinical Data: Chest pain with sickle cell crisis.  CHEST - 2 VIEW  Comparison: 09/22/2012  Findings: Two views of the chest demonstrate a left jugular Port-A- Cath.  Catheter tip is near the SVC/right atrium junction.  There are decreased lung volumes with vascular crowding.  No focal airspace disease.  Heart size is normal.  No  evidence for pleural effusions. Left shoulder replacement.  IMPRESSION: Low lung volumes without focal disease.   Original Report Authenticated By: Richarda Overlie, M.D.    Dg Chest 2 View  09/22/2012  *RADIOLOGY REPORT*  Clinical Data: Sickle cell crisis, shortness of breath, fever, cough, congestion, body aches, history Port-A-Cath, hypertension  CHEST - 2 VIEW  Comparison: 09/16/2012  Findings: Left jugular dual lumen Port-A-Cath unchanged tip projecting over cavoatrial junction. Upper normal heart size. Slight pulmonary vascular congestion. Stable mediastinal contours. Lungs grossly clear. No pleural effusion or pneumothorax. Left shoulder joint replacement.  IMPRESSION: No acute  abnormalities.   Original Report Authenticated By: Ulyses Southward, M.D.     Scheduled Meds: . folic acid  1 mg Oral Daily  . HYDROmorphone PCA 0.3 mg/mL   Intravenous Q4H  . hydroxyurea  2,000 mg Oral Daily  . morphine  60 mg Oral BID   Continuous Infusions: . sodium chloride 100 mL/hr at 10/21/12 1845     Time Spent:45 min

## 2012-10-23 LAB — CBC
MCH: 33.9 pg (ref 26.0–34.0)
MCV: 99.6 fL (ref 78.0–100.0)
Platelets: 261 10*3/uL (ref 150–400)
RDW: 16.3 % — ABNORMAL HIGH (ref 11.5–15.5)
WBC: 8.8 10*3/uL (ref 4.0–10.5)

## 2012-10-23 LAB — URINE CULTURE
Colony Count: NO GROWTH
Culture: NO GROWTH

## 2012-10-23 MED ORDER — HYDROMORPHONE HCL 4 MG PO TABS: 4.0000 mg | ORAL_TABLET | ORAL | Status: DC | PRN

## 2012-10-23 MED ORDER — MORPHINE SULFATE 15 MG PO TABS: 15.0000 mg | ORAL_TABLET | ORAL | Status: DC | PRN

## 2012-10-23 MED ORDER — HEPARIN SOD (PORK) LOCK FLUSH 100 UNIT/ML IV SOLN
INTRAVENOUS | Status: AC
  Filled 2012-10-23: qty 5

## 2012-10-23 MED ORDER — MORPHINE SULFATE ER 60 MG PO TBCR: 60.0000 mg | EXTENDED_RELEASE_TABLET | Freq: Two times a day (BID) | ORAL | Status: DC

## 2012-10-23 NOTE — Progress Notes (Signed)
Discharge teaching completed with teach back. Prescriptions given for MS contin, and Morphine IR. Also handout given on Sickle Cell Crisis and Morphine IR and MS Contin. Answered questions. Pt. To call for follow up appt. With Dr. Cyndie Chime.

## 2012-10-23 NOTE — Discharge Summary (Signed)
Physician Discharge Summary  Dennis Bernard RUE:[id number] DOB: [birth year] DOA: 10/21/2012  PCP: Willey Blade, MD  Admit date: 10/21/2012 Discharge date: 10/23/2012  1. Recommendations for Outpatient Follow-up:   Follow-up Information   Follow up with Levert Feinstein, MD In 1 week.   Contact information:   [address] [place] [address] [phone]      Discharge Diagnoses:  1. Sickle cell vaso-occlusive crisis 2. Sickle cell anemia  Discharge Condition:Stable Disposition: Home Diet recommendation: Regular  Filed Weights   10/22/12 0515  Weight: 76.476 kg (168 lb 9.6 oz)    History of present illness:  27 year old male with past medical history of sickle cell disease who presented to ED with worsening generalized pain, mostly in lower back and upper and lower extremities. Patient also reported subjective fever and cough at home, mostly dry. Patient reports no associated shortness of breath, palpitations or chest pain. No complaint sof abdominal pain, nausea or vomiting. No reports of blood in stool or urine.  In ED, patient received total of 6 mg of IV dilaudid with no significant pain relief. TR[facility] asked to admit for further management of pain crisis.      Hospital Course:  Acute vaso occlusive sickle cell pain crisis  Patient was admitted to the hospital would Sickle cell crisis. He was treated in the usual fashion with IV Dilaudid PCA custom dosing, Phenergan Zofran and Benadryl as needed. Patient's pain over 20 for 48 hours improved. He felt he was close to his baseline pain of about 3. He has to go back to school on Monday.  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 He was discharged with MS Contin twice a day and MSIR as needed. He wanted to try to see if Dilaudid when necessary would work better than MSIR. He has tried Dilaudid in the past.  Gave him prescription for 30 pills of Dilaudid to try instead of the MSIR.   If it doesn't work then he can go back to  the [employer]. I gave him a prescription for 90 of MSIR and 90 of MS Contin. Patient to follow up with PCP. He states that Dr. [name] was no longer taking new patients that he has not gotten in to see Dr. Cyndie Chime as yet.  Sickle cell anemia  Patient's hemoglobin is 7.4. No indications for transfusion. That can be monitored outpatient when he follows up.  Discharge Instructions  Discharge Orders   Future Orders Complete By Expires     Diet general  As directed         Medication List    STOP taking these medications       TYLENOL 500 MG tablet  Generic drug:  acetaminophen      TAKE these medications       albuterol 108 (90 BASE) MCG/ACT inhaler  Commonly known as:  PROVENTIL HFA;VENTOLIN HFA  Inhale 2 puffs into the lungs every 6 (six) hours as needed. wheezing     diphenhydrAMINE 25 MG tablet  Commonly known as:  BENADRYL  Take 25 mg by mouth every 8 (eight) hours as needed. For itching.     folic acid 1 MG tablet  Commonly known as:  FOLVITE  Take 1 mg by mouth daily.     HYDROmorphone 4 MG tablet  Commonly known as:  DILAUDID  Take 1 tablet (4 mg total) by mouth every 4 (four) hours as needed for pain.     hydroxyurea 500 MG capsule  Commonly known as:  HYDREA  Take 2,000 mg by mouth daily. May take with food to minimize GI side effects.     morphine 60 MG 12 hr tablet  Commonly known as:  MS CONTIN  Take 1 tablet (60 mg total) by mouth 2 (two) times daily.     morphine 15 MG tablet  Commonly known as:  MSIR  Take 1 tablet (15 mg total) by mouth every 4 (four) hours as needed. Pain     promethazine 25 MG tablet  Commonly known as:  PHENERGAN  Take 1 tablet (25 mg total) by mouth every 6 (six) hours as needed. Nausea     senna-docusate 8.6-50 MG per tablet  Commonly known as:  Senokot-S  Take 1 tablet by mouth daily as needed. Constipation           Follow-up Information   Follow up with Levert Feinstein, MD In 1 week.   Contact information:   [address] [place] [address] [phone]        The results of significant diagnostics from this hospitalization (including imaging, microbiology, ancillary and laboratory) are listed below for reference.    Significant Diagnostic Studies: Dg Chest 2 View  10/21/2012  *RADIOLOGY REPORT*  Clinical Data: Chest pain, nausea, fever and sickle cell crisis.  CHEST - 2 VIEW  Comparison: 10/06/2012  Findings: Stable positioning of left-sided dual lumen Port-A-Cath. The lungs show stable low volumes with scattered areas of parenchymal scarring.  Stable chronic mildly accentuated pulmonary vasculature without overt edema.  No focal pulmonary consolidation, edema, pneumothorax or pleural fluid is identified.  There is stable mild cardiac enlargement.  The bony thorax is unremarkable.  IMPRESSION: Stable chronic parenchymal scarring and vascular prominence of the lungs.  No acute findings.   Original Report Authenticated By: Irish Lack, M.D.    Dg Chest 2 View  10/06/2012  *RADIOLOGY REPORT*  Clinical Data: Chest pain with sickle cell crisis.  CHEST - 2 VIEW  Comparison: 09/22/2012  Findings: Two views of the chest demonstrate a left jugular Port-A- Cath.  Catheter tip is near the SVC/right atrium junction.  There are decreased lung volumes with vascular crowding.  No focal airspace disease.  Heart size is normal.  No evidence for pleural effusions. Left shoulder replacement.  IMPRESSION: Low lung volumes without focal disease.   Original Report Authenticated By: Richarda Overlie, M.D.     Microbiology: Recent Results (from the past 240 hour(s))  URINE CULTURE     Status: None   Collection Time    10/21/12  5:01 PM      Result Value Range Status   Specimen Description URINE, CLEAN CATCH   Final   Special Requests NONE   Final   Culture  Setup Time 10/22/2012 04:23   Final   Colony Count NO GROWTH   Final   Culture NO GROWTH   Final   Report Status 10/23/2012 FINAL   Final     Labs: Basic  Metabolic Panel:  Recent Labs Lab 10/21/12 1330  NA 142  K 3.6  CL 107  CO2 24  GLUCOSE 100*  BUN 12  CREATININE 0.72  CALCIUM 8.8   Liver Function Tests:  Recent Labs Lab 10/21/12 1330  AST 23  ALT 11  ALKPHOS 74  BILITOT 1.5*  PROT 7.1  ALBUMIN 4.1   CBC:  Recent Labs Lab 10/21/12 1330 10/23/12 0640  WBC 9.8 8.8  NEUTROABS 3.9  --   HGB 8.4* 7.6*  HCT 24.6* 22.3*  MCV 99.2 99.6  PLT 314 261    Time coordinating discharge: 45 min Signed:  Molli Posey, MD 10/23/2012, 11:46 AM

## 2012-10-23 NOTE — Progress Notes (Signed)
Pt. Left via ambulation with Vernona Rieger NT escort to the front door. No resp. Distress noted.

## 2012-10-23 NOTE — Progress Notes (Signed)
Earlier in the shift when I needed to place the empty PCA syringe in the sharps box in this patient's room I noted the lid to be unattached from the sharps box and simply laying on top of the sharp box. Patient stated he didn't know anything about it.  Sharps box was removed for the room and was not replaced.  At 0300 I found the patient sitting on his bed facing his IV pump and it was noted to be infusing at 956mls/hr. When patient was questioned he dropped his chin to his chest and apologized ; also he requested if I could just give him a warning this time and not "ruin his good name" .  Discussed patient safety and medical equipment with patient and initiated the "lock" on the IV pump with the infusion rate set at 134mls/hr. Patient stated he would not attempt to change the rate of infusion again.  ON-Call provider notified.

## 2012-11-07 ENCOUNTER — Emergency Department: Payer: Self-pay | Admitting: Emergency Medicine

## 2012-11-07 LAB — CBC
HCT: 32.1 % — ABNORMAL LOW (ref 40.0–52.0)
HGB: 10.1 g/dL — ABNORMAL LOW (ref 13.0–18.0)
MCHC: 31.5 g/dL — ABNORMAL LOW (ref 32.0–36.0)
MCV: 104 fL — ABNORMAL HIGH (ref 80–100)
Platelet: 472 10*3/uL — ABNORMAL HIGH (ref 150–440)
RBC: 3.09 10*6/uL — ABNORMAL LOW (ref 4.40–5.90)
WBC: 9.3 10*3/uL (ref 3.8–10.6)

## 2012-11-08 ENCOUNTER — Emergency Department (HOSPITAL_COMMUNITY)
Admission: EM | Admit: 2012-11-08 | Discharge: 2012-11-08 | Disposition: A | Discharge status: Home / Self Care | Payer: Medicare Other | Attending: Emergency Medicine | Admitting: Emergency Medicine

## 2012-11-08 ENCOUNTER — Encounter (HOSPITAL_COMMUNITY): Payer: Self-pay | Admitting: *Deleted

## 2012-11-08 ENCOUNTER — Emergency Department (HOSPITAL_COMMUNITY): Payer: Medicare Other

## 2012-11-08 ENCOUNTER — Emergency Department: Payer: Self-pay | Admitting: Unknown Physician Specialty

## 2012-11-08 DIAGNOSIS — D5700 Hb-SS disease with crisis, unspecified: Secondary | ICD-10-CM | POA: Insufficient documentation

## 2012-11-08 DIAGNOSIS — Z8679 Personal history of other diseases of the circulatory system: Secondary | ICD-10-CM | POA: Insufficient documentation

## 2012-11-08 DIAGNOSIS — Z79899 Other long term (current) drug therapy: Secondary | ICD-10-CM | POA: Insufficient documentation

## 2012-11-08 DIAGNOSIS — R509 Fever, unspecified: Secondary | ICD-10-CM | POA: Insufficient documentation

## 2012-11-08 LAB — COMPREHENSIVE METABOLIC PANEL
AST: 21 U/L (ref 0–37)
Albumin: 4.6 g/dL (ref 3.5–5.2)
CO2: 23 mEq/L (ref 19–32)
Calcium: 9.6 mg/dL (ref 8.4–10.5)
Creatinine, Ser: 0.5 mg/dL (ref 0.50–1.35)
GFR calc non Af Amer: >90 mL/min (ref >90)

## 2012-11-08 LAB — RETICULOCYTES
RBC.: 3.1 MIL/uL — ABNORMAL LOW (ref 4.22–5.81)
Retic Count, Absolute: 266.6 10*3/uL — ABNORMAL HIGH (ref 19.0–186.0)

## 2012-11-08 LAB — CBC WITH DIFFERENTIAL/PLATELET
Basophils Absolute: 0 10*3/uL (ref 0.0–0.1)
Basophils Relative: 0 % (ref 0–1)
MCHC: 34.6 g/dL (ref 30.0–36.0)
Neutro Abs: 6.9 10*3/uL (ref 1.7–7.7)
Neutrophils Relative %: 86 % — ABNORMAL HIGH (ref 43–77)
Platelets: 542 10*3/uL — ABNORMAL HIGH (ref 150–400)
RDW: 15.3 % (ref 11.5–15.5)

## 2012-11-08 MED ORDER — POTASSIUM CHLORIDE 10 MEQ/100ML IV SOLN
10.0000 meq | INTRAVENOUS | Status: AC
  Administered 2012-11-08 (×2): 10 meq via INTRAVENOUS
  Filled 2012-11-08 (×2): qty 100

## 2012-11-08 MED ORDER — DIPHENHYDRAMINE HCL 50 MG/ML IJ SOLN
25.0000 mg | Freq: Once | INTRAMUSCULAR | Status: AC
  Administered 2012-11-08: 25 mg via INTRAVENOUS
  Filled 2012-11-08: qty 1

## 2012-11-08 MED ORDER — HYDROMORPHONE HCL PF 2 MG/ML IJ SOLN
4.0000 mg | Freq: Once | INTRAMUSCULAR | Status: AC
  Administered 2012-11-08: 2 mg via INTRAVENOUS
  Filled 2012-11-08: qty 2

## 2012-11-08 MED ORDER — SODIUM CHLORIDE 0.9 % IV BOLUS (SEPSIS)
1000.0000 mL | Freq: Once | INTRAVENOUS | Status: AC
  Administered 2012-11-08: 1000 mL via INTRAVENOUS

## 2012-11-08 MED ORDER — ONDANSETRON HCL 4 MG/2ML IJ SOLN
4.0000 mg | Freq: Once | INTRAMUSCULAR | Status: AC
  Administered 2012-11-08: 4 mg via INTRAVENOUS
  Filled 2012-11-08: qty 2

## 2012-11-08 MED ORDER — HYDROMORPHONE HCL PF 2 MG/ML IJ SOLN
4.0000 mg | Freq: Once | INTRAMUSCULAR | Status: AC
  Administered 2012-11-08: 4 mg via INTRAVENOUS
  Filled 2012-11-08: qty 2

## 2012-11-08 MED ORDER — FENTANYL CITRATE 0.05 MG/ML IJ SOLN
50.0000 ug | Freq: Once | INTRAMUSCULAR | Status: AC
  Administered 2012-11-08: 50 ug via INTRAVENOUS
  Filled 2012-11-08: qty 2

## 2012-11-08 MED ORDER — MORPHINE SULFATE 15 MG PO TABS: 15.0000 mg | ORAL_TABLET | ORAL | Status: DC | PRN

## 2012-11-08 MED ORDER — HEPARIN SOD (PORK) LOCK FLUSH 100 UNIT/ML IV SOLN
INTRAVENOUS | Status: AC
  Administered 2012-11-08: 20:00:00
  Filled 2012-11-08: qty 5

## 2012-11-08 MED ORDER — MORPHINE SULFATE ER 60 MG PO TBCR: 60.0000 mg | EXTENDED_RELEASE_TABLET | Freq: Two times a day (BID) | ORAL | Status: DC

## 2012-11-08 MED ORDER — POTASSIUM CHLORIDE 10 MEQ/100ML IV SOLN: 10.0000 meq | Freq: Once | INTRAVENOUS | Status: DC

## 2012-11-08 NOTE — ED Notes (Signed)
MD at bedside. 

## 2012-11-08 NOTE — ED Notes (Signed)
Dr. Rubin Payor notified that pt is requesting additional pain medication. Dr. Rubin Payor to eval pts home medications prior to ordering pain meds.

## 2012-11-08 NOTE — ED Provider Notes (Signed)
History     CSN: [id number]  Arrival date & time 11/08/12  1325   First MD Initiated Contact with Patient 11/08/12 1502      Chief Complaint  Patient presents with  . Sickle Cell Pain Crisis    (Consider location/radiation/quality/duration/timing/severity/associated sxs/prior treatment) HPI  The patient presents with 2 days of pain, nausea.  He states that prior to the onset of pain he was in his usual state of health.  The os was gradually, since onset the pain has been worsening, without relief with home narcotic use.  The pain is focally about the low back, left lower leg.  The pain is otherwise nonradiating, is sore.  The pain is worse with activity.  The patient's if this pain is typical for his pain exacerbations. The patient also complains of chest congestion without pain, without dyspnea.  He also complains of fever, though this is subjective. No new confusion, disorientation, vomiting, diarrhea, rash.  Past Medical History  Diagnosis Date  . Sickle cell disease   . Avascular necrosis of femur head, right   . H/O allergic rhinitis   . H/O hypokalemia   . Chronic pain syndrome     Past Surgical History  Procedure Laterality Date  . Cholecystectomy  1995  . Left shoulder arthroplasty  04/09/2008  . Left pac placement  07/04/2012  . Exchange transfusion  02/2008    Family History  Problem Relation Age of Onset  . Sickle cell anemia Brother   . Sickle cell trait Father   . Sickle cell trait Mother   . Diabetes Mellitus II Mother   . Sickle cell anemia Paternal Uncle     History  Substance Use Topics  . Smoking status: Never Smoker   . Smokeless tobacco: Never Used  . Alcohol Use: No      Review of Systems  Constitutional:       Per HPI, otherwise negative  HENT:       Per HPI, otherwise negative  Respiratory: Positive for cough.   Cardiovascular:       Per HPI, otherwise negative  Gastrointestinal: Negative for vomiting.  Endocrine:       Negative  aside from HPI  Genitourinary:       Neg aside from HPI   Musculoskeletal:       Per HPI, otherwise negative  Skin: Negative.   Neurological: Negative for syncope.    Allergies  Review of patient's allergies indicates no known allergies.  Home Medications   Current Outpatient Rx  Name  Route  Sig  Dispense  Refill  . albuterol (PROVENTIL HFA;VENTOLIN HFA) 108 (90 BASE) MCG/ACT inhaler   Inhalation   Inhale 2 puffs into the lungs every 6 (six) hours as needed. wheezing         . diphenhydrAMINE (BENADRYL) 25 MG tablet   Oral   Take 25 mg by mouth every 8 (eight) hours as needed. For itching.         . folic acid (FOLVITE) 1 MG tablet   Oral   Take 1 mg by mouth daily.         Marland Kitchen HYDROmorphone (DILAUDID) 4 MG tablet   Oral   Take 1 tablet (4 mg total) by mouth every 4 (four) hours as needed for pain.   30 tablet   0   . hydroxyurea (HYDREA) 500 MG capsule   Oral   Take 2,000 mg by mouth daily. May take with food to minimize GI side effects.         Marland Kitchen  morphine (MS CONTIN) 60 MG 12 hr tablet   Oral   Take 1 tablet (60 mg total) by mouth 2 (two) times daily.   90 tablet   0   . morphine (MSIR) 15 MG tablet   Oral   Take 1 tablet (15 mg total) by mouth every 4 (four) hours as needed. Pain   90 tablet   0   . promethazine (PHENERGAN) 25 MG tablet   Oral   Take 1 tablet (25 mg total) by mouth every 6 (six) hours as needed. Nausea   30 tablet   0   . senna-docusate (SENOKOT-S) 8.6-50 MG per tablet   Oral   Take 1 tablet by mouth daily as needed. Constipation           BP 116/70  Pulse 82  Temp(Src) 98.9 F (37.2 C) (Oral)  Resp 20  SpO2 100%  Physical Exam  Nursing note and vitals reviewed. Constitutional: He is oriented to person, place, and time. He appears well-developed. No distress.  HENT:  Head: Normocephalic and atraumatic.  Eyes: Conjunctivae and EOM are normal.  Cardiovascular: Normal rate and regular rhythm.   Pulmonary/Chest:  Effort normal. No stridor. No respiratory distress.  Abdominal: He exhibits no distension.  Musculoskeletal: He exhibits no edema.       Arms: Neurological: He is alert and oriented to person, place, and time.  Skin: Skin is warm and dry.  Psychiatric: He has a normal mood and affect.    ED Course  Procedures (including critical care time)  Labs Reviewed  CBC WITH DIFFERENTIAL - Abnormal; Notable for the following:    RBC 3.10 (*)    Hemoglobin 10.3 (*)    HCT 29.8 (*)    Platelets 542 (*)    Neutrophils Relative 86 (*)    Lymphocytes Relative 11 (*)    All other components within normal limits  RETICULOCYTES - Abnormal; Notable for the following:    Retic Ct Pct 8.6 (*)    RBC. 3.10 (*)    Retic Count, Manual 266.6 (*)    All other components within normal limits  COMPREHENSIVE METABOLIC PANEL - Abnormal; Notable for the following:    Potassium 3.1 (*)    Glucose, Bld 110 (*)    Total Bilirubin 1.5 (*)    All other components within normal limits   No results found.   No diagnosis found.  7:01 PM Following several rounds of analgesics, the patient appears much more comfortable.  He states he feels better.  Labs not remarkable, and there is evidence of appropriate reticulocytosis.  No evidence of pneumonia, ECG is unremarkable.   Pulse ox 90% room air normal  Date: 11/08/2012  Rate: 73  Rhythm: normal sinus rhythm  QRS Axis: normal  Intervals: normal  ST/T Wave abnormalities: normal  Conduction Disutrbances: none  Narrative Interpretation: unremarkable     MDM  This young male with sickle cell presents with his typical pain, as well as cough and congestion.  Given the concern for ACS, ECG and x-ray were performed.  These were reassuring, and the patient's pain improved substantially while in the emergency department.  The patient has followup with a hematologist, and was discharged in stable condition with refill of the medications he  requested.      Gerhard Munch, MD 11/08/12 Mikle Bosworth

## 2012-11-08 NOTE — ED Notes (Signed)
Pt c/o sickle cell crisis, pain in his lower back and left leg, also reports chest congestion and lower grade fever

## 2012-11-09 ENCOUNTER — Ambulatory Visit: Payer: Self-pay | Admitting: Oncology

## 2012-11-10 ENCOUNTER — Encounter (HOSPITAL_COMMUNITY): Payer: Self-pay

## 2012-11-10 ENCOUNTER — Emergency Department (HOSPITAL_COMMUNITY): Payer: Medicare Other

## 2012-11-10 ENCOUNTER — Inpatient Hospital Stay (HOSPITAL_COMMUNITY)
Admission: EM | Admit: 2012-11-10 | Discharge: 2012-11-14 | DRG: 812 | Disposition: A | Discharge status: Home / Self Care | Payer: Medicare Other | Attending: Internal Medicine | Admitting: Internal Medicine

## 2012-11-10 DIAGNOSIS — Z9119 Patient's noncompliance with other medical treatment and regimen: Secondary | ICD-10-CM

## 2012-11-10 DIAGNOSIS — D72829 Elevated white blood cell count, unspecified: Secondary | ICD-10-CM

## 2012-11-10 DIAGNOSIS — Z91199 Patient's noncompliance with other medical treatment and regimen due to unspecified reason: Secondary | ICD-10-CM

## 2012-11-10 DIAGNOSIS — E876 Hypokalemia: Secondary | ICD-10-CM

## 2012-11-10 DIAGNOSIS — D75839 Thrombocytosis, unspecified: Secondary | ICD-10-CM

## 2012-11-10 DIAGNOSIS — D473 Essential (hemorrhagic) thrombocythemia: Secondary | ICD-10-CM | POA: Diagnosis present

## 2012-11-10 DIAGNOSIS — D5700 Hb-SS disease with crisis, unspecified: Secondary | ICD-10-CM | POA: Diagnosis present

## 2012-11-10 DIAGNOSIS — D571 Sickle-cell disease without crisis: Secondary | ICD-10-CM | POA: Diagnosis present

## 2012-11-10 HISTORY — DX: Personal history of other specified conditions: Z87.898

## 2012-11-10 LAB — CBC WITH DIFFERENTIAL/PLATELET
Basophils Absolute: 0.1 10*3/uL (ref 0.0–0.1)
Basophils Relative: 0 % (ref 0–1)
Eosinophils Absolute: 0 10*3/uL (ref 0.0–0.7)
Eosinophils Relative: 0 % (ref 0–5)
HCT: 26.3 % — ABNORMAL LOW (ref 39.0–52.0)
Hemoglobin: 9.3 g/dL — ABNORMAL LOW (ref 13.0–17.0)
Lymphocytes Relative: 35 % (ref 12–46)
Lymphs Abs: 5.3 10*3/uL — ABNORMAL HIGH (ref 0.7–4.0)
MCH: 33.8 pg (ref 26.0–34.0)
MCHC: 35.4 g/dL (ref 30.0–36.0)
MCV: 95.6 fL (ref 78.0–100.0)
Monocytes Absolute: 1 10*3/uL (ref 0.1–1.0)
Monocytes Relative: 7 % (ref 3–12)
Neutro Abs: 8.6 10*3/uL — ABNORMAL HIGH (ref 1.7–7.7)
Neutrophils Relative %: 58 % (ref 43–77)
Platelets: 504 10*3/uL — ABNORMAL HIGH (ref 150–400)
RBC: 2.75 MIL/uL — ABNORMAL LOW (ref 4.22–5.81)
RDW: 16.1 % — ABNORMAL HIGH (ref 11.5–15.5)
WBC: 15 10*3/uL — ABNORMAL HIGH (ref 4.0–10.5)

## 2012-11-10 LAB — HEPATIC FUNCTION PANEL
ALT: 12 U/L (ref 0–53)
AST: 26 U/L (ref 0–37)
Albumin: 4.4 g/dL (ref 3.5–5.2)
Alkaline Phosphatase: 77 U/L (ref 39–117)
Bilirubin, Direct: 0.3 mg/dL (ref 0.0–0.3)
Total Bilirubin: 1.7 mg/dL — ABNORMAL HIGH (ref 0.3–1.2)

## 2012-11-10 LAB — BASIC METABOLIC PANEL
BUN: 14 mg/dL (ref 6–23)
CO2: 26 mEq/L (ref 19–32)
Calcium: 9.1 mg/dL (ref 8.4–10.5)
Chloride: 102 mEq/L (ref 96–112)
Creatinine, Ser: 0.7 mg/dL (ref 0.50–1.35)
GFR calc Af Amer: >90 mL/min (ref >90)
GFR calc non Af Amer: >90 mL/min (ref >90)
Glucose, Bld: 100 mg/dL — ABNORMAL HIGH (ref 70–99)
Potassium: 3.6 mEq/L (ref 3.5–5.1)
Sodium: 138 mEq/L (ref 135–145)

## 2012-11-10 LAB — RETICULOCYTES
RBC.: 2.75 MIL/uL — ABNORMAL LOW (ref 4.22–5.81)
Retic Count, Absolute: 192.5 10*3/uL — ABNORMAL HIGH (ref 19.0–186.0)
Retic Ct Pct: 7 % — ABNORMAL HIGH (ref 0.4–3.1)

## 2012-11-10 MED ORDER — SODIUM CHLORIDE 0.9 % IV SOLN
Freq: Once | INTRAVENOUS | Status: AC
  Administered 2012-11-10: 15:00:00 via INTRAVENOUS

## 2012-11-10 MED ORDER — ENOXAPARIN SODIUM 40 MG/0.4ML ~~LOC~~ SOLN
40.0000 mg | SUBCUTANEOUS | Status: DC
  Administered 2012-11-10 – 2012-11-13 (×4): 40 mg via SUBCUTANEOUS
  Filled 2012-11-10 (×5): qty 0.4

## 2012-11-10 MED ORDER — ALBUTEROL SULFATE HFA 108 (90 BASE) MCG/ACT IN AERS
2.0000 | INHALATION_SPRAY | Freq: Two times a day (BID) | RESPIRATORY_TRACT | Status: DC
  Administered 2012-11-11 – 2012-11-14 (×7): 2 via RESPIRATORY_TRACT
  Filled 2012-11-10: qty 6.7

## 2012-11-10 MED ORDER — MORPHINE SULFATE ER 30 MG PO TBCR
60.0000 mg | EXTENDED_RELEASE_TABLET | Freq: Two times a day (BID) | ORAL | Status: DC
  Administered 2012-11-10 – 2012-11-13 (×6): 60 mg via ORAL
  Filled 2012-11-10: qty 2
  Filled 2012-11-10: qty 1
  Filled 2012-11-10 (×3): qty 2
  Filled 2012-11-10: qty 1
  Filled 2012-11-10: qty 2

## 2012-11-10 MED ORDER — HYDROXYUREA 500 MG PO CAPS
2000.0000 mg | ORAL_CAPSULE | Freq: Every day | ORAL | Status: DC
  Administered 2012-11-11: 2000 mg via ORAL
  Filled 2012-11-10: qty 4

## 2012-11-10 MED ORDER — SODIUM CHLORIDE 0.9 % IV BOLUS (SEPSIS)
2000.0000 mL | Freq: Once | INTRAVENOUS | Status: AC
  Administered 2012-11-10: 1000 mL via INTRAVENOUS

## 2012-11-10 MED ORDER — ALBUTEROL SULFATE HFA 108 (90 BASE) MCG/ACT IN AERS
2.0000 | INHALATION_SPRAY | RESPIRATORY_TRACT | Status: DC
  Administered 2012-11-10: 2 via RESPIRATORY_TRACT
  Filled 2012-11-10: qty 6.7

## 2012-11-10 MED ORDER — DOCUSATE SODIUM 100 MG PO CAPS
100.0000 mg | ORAL_CAPSULE | Freq: Two times a day (BID) | ORAL | Status: DC
  Administered 2012-11-10 – 2012-11-14 (×8): 100 mg via ORAL
  Filled 2012-11-10 (×9): qty 1

## 2012-11-10 MED ORDER — HYDROMORPHONE 0.3 MG/ML IV SOLN
INTRAVENOUS | Status: DC
  Administered 2012-11-10 (×2): via INTRAVENOUS
  Administered 2012-11-11: 9.97 mg via INTRAVENOUS
  Administered 2012-11-11: 4.99 mg via INTRAVENOUS
  Administered 2012-11-11: 5.99 mg via INTRAVENOUS
  Administered 2012-11-11: 11:00:00 via INTRAVENOUS
  Administered 2012-11-11: 2.7 mg via INTRAVENOUS
  Administered 2012-11-11: 13:00:00 via INTRAVENOUS
  Administered 2012-11-11: 3.49 mg via INTRAVENOUS
  Administered 2012-11-11: 05:00:00 via INTRAVENOUS
  Filled 2012-11-10 (×5): qty 25

## 2012-11-10 MED ORDER — SODIUM CHLORIDE 0.9 % IV SOLN
INTRAVENOUS | Status: AC
  Administered 2012-11-11: 04:00:00 via INTRAVENOUS

## 2012-11-10 MED ORDER — PANTOPRAZOLE SODIUM 40 MG PO TBEC
40.0000 mg | DELAYED_RELEASE_TABLET | Freq: Every day | ORAL | Status: DC
  Administered 2012-11-10 – 2012-11-14 (×5): 40 mg via ORAL
  Filled 2012-11-10 (×5): qty 1

## 2012-11-10 MED ORDER — ONDANSETRON HCL 4 MG/2ML IJ SOLN
4.0000 mg | Freq: Once | INTRAMUSCULAR | Status: AC
  Administered 2012-11-10: 4 mg via INTRAVENOUS
  Filled 2012-11-10: qty 2

## 2012-11-10 MED ORDER — SENNOSIDES-DOCUSATE SODIUM 8.6-50 MG PO TABS
1.0000 | ORAL_TABLET | Freq: Every day | ORAL | Status: DC | PRN
  Filled 2012-11-10: qty 1

## 2012-11-10 MED ORDER — HYDROMORPHONE HCL PF 2 MG/ML IJ SOLN
2.0000 mg | Freq: Once | INTRAMUSCULAR | Status: AC
  Administered 2012-11-10: 2 mg via INTRAVENOUS
  Filled 2012-11-10: qty 1

## 2012-11-10 MED ORDER — FOLIC ACID 1 MG PO TABS
1.0000 mg | ORAL_TABLET | Freq: Every day | ORAL | Status: DC
  Administered 2012-11-11 – 2012-11-14 (×4): 1 mg via ORAL
  Filled 2012-11-10 (×4): qty 1

## 2012-11-10 MED ORDER — KETOROLAC TROMETHAMINE 30 MG/ML IJ SOLN
30.0000 mg | Freq: Four times a day (QID) | INTRAMUSCULAR | Status: DC
  Administered 2012-11-11 – 2012-11-14 (×14): 30 mg via INTRAVENOUS
  Filled 2012-11-10 (×18): qty 1

## 2012-11-10 MED ORDER — DIPHENHYDRAMINE HCL 25 MG PO TABS
25.0000 mg | ORAL_TABLET | Freq: Four times a day (QID) | ORAL | Status: DC | PRN
  Administered 2012-11-10: 25 mg via ORAL
  Filled 2012-11-10 (×2): qty 1

## 2012-11-10 MED ORDER — ONDANSETRON HCL 4 MG/2ML IJ SOLN: 4.0000 mg | Freq: Four times a day (QID) | INTRAMUSCULAR | Status: DC | PRN

## 2012-11-10 MED ORDER — PROMETHAZINE HCL 25 MG PO TABS: 25.0000 mg | ORAL_TABLET | Freq: Four times a day (QID) | ORAL | Status: DC | PRN

## 2012-11-10 MED ORDER — NALOXONE HCL 0.4 MG/ML IJ SOLN: 0.4000 mg | INTRAMUSCULAR | Status: DC | PRN

## 2012-11-10 MED ORDER — SODIUM CHLORIDE 0.9 % IJ SOLN
9.0000 mL | INTRAMUSCULAR | Status: DC | PRN
  Administered 2012-11-14: 9 mL via INTRAVENOUS

## 2012-11-10 MED ORDER — DIPHENHYDRAMINE HCL 50 MG/ML IJ SOLN
25.0000 mg | Freq: Once | INTRAMUSCULAR | Status: AC
  Administered 2012-11-10: 25 mg via INTRAVENOUS
  Filled 2012-11-10: qty 1

## 2012-11-10 NOTE — ED Notes (Signed)
Floor RN unavailable for report at this time. 

## 2012-11-10 NOTE — ED Notes (Signed)
Patient requesting more pain medicine.  

## 2012-11-10 NOTE — H&P (Signed)
[facility] History and Physical  Dennis Bernard VHQ:[id number] DOB: 1986-01-23 DOA: 11/10/2012  Referring physician:  Ebbie Bernard PCP:  Dennis Bernard Dennis Bernard   Chief Complaint:  Pain   HPI:  The patient is a 27 y.o. year-old male with history of sickle cell anemia and chronic pain who presents with pain crisis.  The patient was last at his baseline health when he was discharged from the hospital a week ago.  He was admitted for two days on dilaudid PCA for pain in his back and left leg and discharged on MS contin 60mg  BID and Dennis Bernard acting morphine and dilaudid so that he could try each one to see which worked best for him.    He returned to the hospital 2 days ago with 10/10 pain in the same places and received IV fluids and dilaudid.  His pain score went down to 5/10 and he was discharged from the ER.  He continued taking MS contin 60 mg BID and he had been taking the morphine IR 15mg  every 4 hours scheduled for the last two days without relief.  He did not take ibuprofen but has been taking some tylenol.  Back pain and left leg pain are currently 10/10 despite 6mg  of IV dilaudid in the ER and he is being admitted for pain control.     Additionally, he thinks he had a fever to 101F yesterday.  He has had some chest congestion with some shortness of breath and wheezing for the last day.  He is not coughing anything up.  He has also had some sinus congestion.  He did receive his flu shot, denies sick contacts.  He has had this kind of wheezing before with previous URI.  Denies chest pain and tightness.  He took tylenol for his fever which helped.  Mild nausea without vomiting and denies diarrhea.  Last BM was yesterday.  Currently afebrile in the ER and CXR demonstrates no infiltrate but does demonstrate cardiomegaly with pulmonary vascular congestion.    Review of Systems:  Denies chills, weight loss or gain, changes to hearing and vision.   + rhinorrhea, sinus congestion, sore throat.  Denies  chest pain and palpitations.  + SOB, wheezing, cough.  Denies vomiting, constipation, diarrhea.  Denies dysuria, frequency, urgency, polyuria, polydipsia.  Denies hematemesis, blood in stools, melena, abnormal bruising or bleeding.  Denies lymphadenopathy.  + myalgias.  Denies skin rash or ulcer.  Denies lower extremity edema.  Denies focal numbness, weakness, slurred speech, confusion, facial droop.  Denies anxiety and depression.    Past Medical History  Diagnosis Date  . Sickle cell disease   . Avascular necrosis of femur head, right   . H/O allergic rhinitis   . H/O hypokalemia   . Chronic pain syndrome   . H/O wheezing     with colds   Past Surgical History  Procedure Laterality Date  . Cholecystectomy  1995  . Left shoulder arthroplasty  04/09/2008  . Left pac placement  07/04/2012  . Exchange transfusion  02/2008    perioperatively for shoulder surgery   Social History:  reports that he has never smoked. He has never used smokeless tobacco. He reports that he does not drink alcohol or use illicit drugs. Patient lives in [place]. 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 Is from [place]. Usually followed at [facility] for his sickle cell disease.  Primary hematologist Dr. James Ivanoff at [facility].    No Known Allergies  Family History  Problem Relation Age of Onset  .  Sickle cell anemia Brother   . Sickle cell trait Father   . Sickle cell trait Mother   . Diabetes Mellitus II Mother   . Sickle cell anemia Paternal Uncle   . Asthma Neg Hx   . Allergic rhinitis Neg Hx      Prior to Admission medications   Medication Sig Start Date End Date Taking? Authorizing Provider  albuterol (PROVENTIL HFA;VENTOLIN HFA) 108 (90 BASE) MCG/ACT inhaler Inhale 2 puffs into the lungs every 6 (six) hours as needed for wheezing or shortness of breath. wheezing 09/12/12  Yes Christina P Rama, Bernard  diphenhydrAMINE (BENADRYL) 25 MG tablet Take 25 mg by mouth every 8 (eight) hours as needed. For itching. 08/23/12  Yes  Dow Adolph, Bernard  folic acid (FOLVITE) 1 MG tablet Take 1 mg by mouth daily.   Yes Historical Provider, Bernard  HYDROmorphone (DILAUDID) 4 MG tablet Take 1 tablet (4 mg total) by mouth every 4 (four) hours as needed for pain. 10/23/12  Yes Novlet Adelina Mings, Bernard  hydroxyurea (HYDREA) 500 MG capsule Take 2,000 mg by mouth daily. May take with food to minimize GI side effects.   Yes Historical Provider, Bernard  morphine (MS CONTIN) 60 MG 12 hr tablet Take 60 mg by mouth 2 (two) times daily. 11/08/12  Yes Gerhard Munch, Bernard  morphine (MSIR) 15 MG tablet Take 15 mg by mouth every 4 (four) hours as needed for pain. Pain 11/08/12  Yes Gerhard Munch, Bernard  promethazine (PHENERGAN) 25 MG tablet Take 25 mg by mouth every 6 (six) hours as needed for nausea. Nausea 10/09/12  Yes Novlet Adelina Mings, Bernard  senna-docusate (SENOKOT-S) 8.6-50 MG per tablet Take 1 tablet by mouth daily as needed. Constipation 08/27/12  Yes Linward Headland, Bernard   Physical Exam: Filed Vitals:   11/10/12 1202 11/10/12 1854  BP: 126/69 106/67  Pulse: 98 86  Temp: 98.9 F (37.2 C)   TempSrc: Oral   Resp: 20 16  SpO2: 99% 97%     General:  AAM, average weight, no acute distress, lying on stretcher  Eyes:  PERRL, anicteric, non-injected.  ENT:  Nares congested with boggy turbinates and clear rhinorrhea.  OP clear, non-erythematous without plaques or exudates.  MMM.  Neck:  Supple without TM or JVD.    Lymph:  No cervical, supraclavicular, or submandibular LAD.  Cardiovascular:  RRR, normal S1, S2, 1/6 systolic murmur at the apex, no rubs or gallops.  2+ pulses, warm extremities  Respiratory:  CTA bilaterally without increased WOB.  No wheezes, rales, or rhonchi  Abdomen:  NABS.  Soft, ND/NT.    Skin:  No rashes or focal lesions.  Musculoskeletal:  Normal bulk and tone.  No LE edema.  TTP along the L4-L5 and upper sacrum and slightly to the left and along the left lateral femur from the greater trochanter to the mid thigh.  No pain with ROM  of the hip or knee.  No knee effusions.    Psychiatric:  A & O x 4.  Appropriate affect.  Neurologic:  CN 3-12 intact.  5/5 strength.  Sensation intact.  Labs on Admission:  Basic Metabolic Panel:  Recent Labs Lab 11/08/12 1421 11/10/12 1342  NA 137 138  K 3.1* 3.6  CL 102 102  CO2 23 26  GLUCOSE 110* 100*  BUN 9 14  CREATININE 0.50 0.70  CALCIUM 9.6 9.1   Liver Function Tests:  Recent Labs Lab 11/08/12 1421  AST 21  ALT 13  ALKPHOS 85  BILITOT 1.5*  PROT 7.9  ALBUMIN 4.6   No results found for this basename: LIPASE, AMYLASE,  in the last 168 hours No results found for this basename: AMMONIA,  in the last 168 hours CBC:  Recent Labs Lab 11/08/12 1421 11/10/12 1342  WBC 8.1 15.0*  NEUTROABS 6.9 8.6*  HGB 10.3* 9.3*  HCT 29.8* 26.3*  MCV 96.1 95.6  PLT 542* 504*   Cardiac Enzymes: No results found for this basename: CKTOTAL, CKMB, CKMBINDEX, TROPONINI,  in the last 168 hours  BNP (last 3 results) No results found for this basename: PROBNP,  in the last 8760 hours CBG: No results found for this basename: GLUCAP,  in the last 168 hours  Radiological Exams on Admission: Dg Chest 2 View  11/10/2012  *RADIOLOGY REPORT*  Clinical Data: Cough and fever  CHEST - 2 VIEW  Comparison: 11/08/2012  Findings: Left chest wall porta-catheter is noted with tip in the cavoatrial junction.  Mild cardiac enlargement is stable from previous exam.  There is pulmonary venous congestion.  No overt edema.  No pleural effusion identified.  There is no airspace consolidation. The skeletal changes of sickle cell disease are noted.  IMPRESSION:  1.  Cardiac enlargement and pulmonary venous congestion. 2.  No focal airspace consolidation identified.   Original Report Authenticated By: Signa Kell, M.D.     EKG: Independently reviewed. NSR, T-wave inversion III and flattened in II and avf.  Flattened T-waves in V3-V6, similar to prior.     Assessment/Plan Active Problems:   Sickle  cell pain crisis   Sickle cell anemia   Leukocytosis   Thrombocytosis   Active Problems:   Sickle cell pain crisis   Sickle cell anemia   Leukocytosis   Thrombocytosis   Acute vaso occlusive sickle cell pain crisis  Continue supportive care with IV fluids and analgesics.  Dilaudid PCA for better pain control.  Continue MS contin   Will need to readdress Kisha Messman acting oral pain medication Continue benadryl PRN and phenergan PRN Toradol scheduled Daily CBC, CMP, retic   Possible fever with URI symptoms without chest pain or infiltrate on CXR -  Monitor for fever -  Flu PCR -  Symptomatic care for now -  Incentive spirometry -  Scheduled albuterol -  OOB as much as possible to prevent acute chest  Cardiomegaly, pulmonary vasc congestion, and sensation of wheezing.  No ECHO in more than 5 years for patient -  ECHO  Leukocytosis, above baseline of 8.  May be indicative of inflammation from pain crisis or underlying infection Thrombocytosis, above baseline of 200-300.   May be indicative of inflammation from pain crisis or underlying infection  Sickle cell anemia  Hemoglobin on admission 9.3, baseline 7.2 to 8.4 Continue to monitor CBC Retic 7%, slightly below baseline of 12% Continue folate Continue hydroxyurea (noted lack of macrocytosis) Ferritin in AM  Diet:  regular Access:  Port IVF:  OFF Proph:  lovenox  Code Status: full code Family Communication: spoke with apt Disposition Plan: admit to telemetry  Time spent: 60 min  Renae Fickle [facility] Pager [phone]  If 7PM-7AM, please contact night-coverage [email or web address] Password [facility] 11/10/2012, 7:34 PM

## 2012-11-10 NOTE — ED Notes (Signed)
Patient states he has been having sickle cell pain x 5 days and was seen in the ED 2 days ago for the same and was discharged. Patient states he has been having a fever and SOB. Patient reports pain in his lower back and left leg.

## 2012-11-10 NOTE — ED Notes (Signed)
Patient c/o SOB, back pain, and recent fever.

## 2012-11-10 NOTE — ED Notes (Signed)
[id number]<WG> Expected date:11/10/12<BR> Expected time:<BR> Means of arrival:<BR> Comments:<BR>

## 2012-11-10 NOTE — ED Provider Notes (Signed)
History     CSN: [id number]  Arrival date & time 11/10/12  1158   First MD Initiated Contact with Patient 11/10/12 1335      Chief Complaint  Patient presents with  . Sickle Cell Pain Crisis    (Consider location/radiation/quality/duration/timing/severity/associated sxs/prior treatment) Patient is a 28 y.o. male presenting with sickle cell pain.  Sickle Cell Pain Crisis    Patient is a 27 yo male with a history of sickle cell disease who presents with abdominal pain and leg pain.  He started having this pain five days ago and came to the ED on Sunday and was discharged home.  He is complaining of a fever, feeling nauseated and shortness of breath.  He is in the process of switching hematologists because his current doctor is leaving.  Denies any vomiting, coughing or chest pain.    Past Medical History  Diagnosis Date  . Sickle cell disease   . Avascular necrosis of femur head, right   . H/O allergic rhinitis   . H/O hypokalemia   . Chronic pain syndrome     Past Surgical History  Procedure Laterality Date  . Cholecystectomy  1995  . Left shoulder arthroplasty  04/09/2008  . Left pac placement  07/04/2012  . Exchange transfusion  02/2008    Family History  Problem Relation Age of Onset  . Sickle cell anemia Brother   . Sickle cell trait Father   . Sickle cell trait Mother   . Diabetes Mellitus II Mother   . Sickle cell anemia Paternal Uncle     History  Substance Use Topics  . Smoking status: Never Smoker   . Smokeless tobacco: Never Used  . Alcohol Use: No      Review of Systems All other systems negative except as documented in the HPI. All pertinent positives and negatives as reviewed in the HPI.  Allergies  Review of patient's allergies indicates no known allergies.  Home Medications   Current Outpatient Rx  Name  Route  Sig  Dispense  Refill  . albuterol (PROVENTIL HFA;VENTOLIN HFA) 108 (90 BASE) MCG/ACT inhaler   Inhalation   Inhale 2 puffs into  the lungs every 6 (six) hours as needed for wheezing or shortness of breath. wheezing         . diphenhydrAMINE (BENADRYL) 25 MG tablet   Oral   Take 25 mg by mouth every 8 (eight) hours as needed. For itching.         . folic acid (FOLVITE) 1 MG tablet   Oral   Take 1 mg by mouth daily.         Marland Kitchen HYDROmorphone (DILAUDID) 4 MG tablet   Oral   Take 1 tablet (4 mg total) by mouth every 4 (four) hours as needed for pain.   30 tablet   0   . hydroxyurea (HYDREA) 500 MG capsule   Oral   Take 2,000 mg by mouth daily. May take with food to minimize GI side effects.         Marland Kitchen morphine (MS CONTIN) 60 MG 12 hr tablet   Oral   Take 60 mg by mouth 2 (two) times daily.         Marland Kitchen morphine (MSIR) 15 MG tablet   Oral   Take 15 mg by mouth every 4 (four) hours as needed for pain. Pain         . promethazine (PHENERGAN) 25 MG tablet   Oral   Take 25  mg by mouth every 6 (six) hours as needed for nausea. Nausea         . senna-docusate (SENOKOT-S) 8.6-50 MG per tablet   Oral   Take 1 tablet by mouth daily as needed. Constipation           BP 126/69  Pulse 98  Temp(Src) 98.9 F (37.2 C) (Oral)  Resp 20  SpO2 99%  Physical Exam  Nursing note and vitals reviewed. Constitutional: He is oriented to person, place, and time. He appears well-developed and well-nourished. No distress.  HENT:  Head: Normocephalic and atraumatic.  Right Ear: External ear normal.  Left Ear: External ear normal.  Mouth/Throat: Oropharynx is clear and moist.  Eyes: Pupils are equal, round, and reactive to light. Right eye exhibits no discharge. Left eye exhibits no discharge. No scleral icterus.  Neck: Normal range of motion. Neck supple.  Cardiovascular: Normal rate, regular rhythm and normal heart sounds.  Exam reveals no gallop and no friction rub.   No murmur heard. Pulmonary/Chest: Effort normal and breath sounds normal. No respiratory distress. He has no wheezes.  Neurological: He is  alert and oriented to person, place, and time.  Skin: Skin is warm. No rash noted. He is diaphoretic. No erythema. No pallor.  Psychiatric: He has a normal mood and affect. His behavior is normal. Judgment and thought content normal.    ED Course  Procedures (including critical care time)  Labs Reviewed  CBC WITH DIFFERENTIAL  BASIC METABOLIC PANEL  RETICULOCYTES   Dg Chest 2 View  11/08/2012  *RADIOLOGY REPORT*  Clinical Data: Shortness of breath and fever; history of sickle cell disease  CHEST - 2 VIEW  Comparison: October 21, 2012  Findings:  No edema or consolidation.  Heart size and pulmonary vascularity are normal.  No adenopathy.  Port-A-Cath tip is in the right atrium.  No pneumothorax.  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  IMPRESSION: Bony changes consistent with known sickle cell disease.  No edema or consolidation.   Original Report Authenticated By: Bretta Bang, M.D.      No diagnosis found.  2:45pm patient still complains of pain.  It has not gotten any better with the 2 mg of Dilaudid.  Will give him another 2 mg and benadryl and see how he responds. Is able to drink fluids.    Hospitalist to admit the patient. He has been stable other than pain.  MDM  MDM Reviewed: nursing note and vitals Interpretation: labs and x-ray Consults: admitting MD            Carlyle Dolly, PA-C 11/10/12 2013

## 2012-11-11 DIAGNOSIS — I517 Cardiomegaly: Secondary | ICD-10-CM

## 2012-11-11 LAB — COMPREHENSIVE METABOLIC PANEL
ALT: 10 U/L (ref 0–53)
Alkaline Phosphatase: 59 U/L (ref 39–117)
Chloride: 107 mEq/L (ref 96–112)
GFR calc Af Amer: >90 mL/min (ref >90)
Glucose, Bld: 91 mg/dL (ref 70–99)
Potassium: 3 mEq/L — ABNORMAL LOW (ref 3.5–5.1)
Sodium: 139 mEq/L (ref 135–145)
Total Bilirubin: 1.6 mg/dL — ABNORMAL HIGH (ref 0.3–1.2)
Total Protein: 5.9 g/dL — ABNORMAL LOW (ref 6.0–8.3)

## 2012-11-11 LAB — CBC
HCT: 23.3 % — ABNORMAL LOW (ref 39.0–52.0)
MCHC: 34.8 g/dL (ref 30.0–36.0)
MCV: 95.5 fL (ref 78.0–100.0)
Platelets: 386 10*3/uL (ref 150–400)
RDW: 16.2 % — ABNORMAL HIGH (ref 11.5–15.5)

## 2012-11-11 LAB — INFLUENZA PANEL BY PCR (TYPE A & B)
H1N1 flu by pcr: NOT DETECTED
Influenza B By PCR: NEGATIVE

## 2012-11-11 LAB — RETICULOCYTES: Retic Ct Pct: 7.6 % — ABNORMAL HIGH (ref 0.4–3.1)

## 2012-11-11 MED ORDER — POTASSIUM CHLORIDE CRYS ER 20 MEQ PO TBCR
40.0000 meq | EXTENDED_RELEASE_TABLET | ORAL | Status: AC
  Administered 2012-11-11 (×2): 40 meq via ORAL
  Filled 2012-11-11 (×2): qty 2

## 2012-11-11 MED ORDER — HYDROMORPHONE HCL PF 2 MG/ML IJ SOLN
4.0000 mg | INTRAMUSCULAR | Status: DC
  Administered 2012-11-11 – 2012-11-12 (×11): 4 mg via INTRAVENOUS
  Filled 2012-11-11 (×11): qty 2

## 2012-11-11 MED ORDER — HYDROXYUREA 500 MG PO CAPS
1000.0000 mg | ORAL_CAPSULE | Freq: Every day | ORAL | Status: DC
  Administered 2012-11-12 – 2012-11-14 (×3): 1000 mg via ORAL
  Filled 2012-11-11 (×4): qty 2

## 2012-11-11 NOTE — Progress Notes (Signed)
Pt was in bathroom when this RN came to assess pt. Lauren the NT informed this RN that she had found an empty 10 cc syringe and a hooka cigarette in the pt's sheets.

## 2012-11-11 NOTE — Progress Notes (Signed)
  Echocardiogram 2D Echocardiogram has been performed.  Dennis Bernard 11/11/2012, 9:38 AM 

## 2012-11-11 NOTE — Progress Notes (Signed)
Pt called Onalee Hua RN to room to report that he felt his pain is not under control with the PCA. Onalee Hua RN let this RN know and this RN called Dr. Ashley Royalty to report that pt's pain is not under control. Received order to d/c PCA and 4mg  of dilaudid IV will be ordered q 2 hours until pt's pain is under control. Dr. Ashley Royalty will reevaluate pt's pain control later this afternoon and may restart PCA.

## 2012-11-11 NOTE — Progress Notes (Signed)
SICKLE CELL SERVICE PROGRESS NOTE  Dennis Bernard ZOX:[id number] DOB: 1986-02-09 DOA: 11/10/2012 PCP: Willey Blade, MD  Assessment/Plan: Active Problems:   Sickle cell pain crisis: Pt reported that the MS Contin seems to be ineffective in managing his pain at home and he is requiring short-acting medications on a daily basis as compared to several months ago. He feels that the PCA is completely ineffective in managing pain. I will change him to bolus doses of Dilaudid 4 mg every 2 hours. This should be reevaluated in the next 24 hours and further adjustments made.     Sickle cell anemia: The patient admitted that he has been noncompliant with taking his Hydrea secondary to cost associated with it. He states that he takes his medications about once to twice per week at the most. I discussed with the patient the importance of taking Hydrea for disease modifying medication. In light of the fact the patient has not been taking his Hydrea I will reduce his dose to a starting dose of 50 mg per kilogram and titrate accordingly. The patient is agreed that he will speak with parents about trying to get some assistance in obtaining the medication.    Leukocytosis: Patient had a mild leukocytosis on admission however this is now resolved.    Thrombocytosis: I suspect this is reactive secondary to the hemolytic process. Additionally the patient likely had some degree of hemoconcentration. Platelets are now within normal range.   Code Status: Full code Family Communication: Not applicable Disposition Plan: Home at the time of discharge  Dennis Bernard A.  Pager [phone]. If 7PM-7AM, please contact night-coverage.  11/11/2012, 5:44 PM  LOS: 1 day   Brief narrative: The patient is a 27 y.o. year-old male with history of sickle cell anemia and chronic pain who presents with pain crisis. The patient was last at his baseline health when he was discharged from the hospital a week ago. He was admitted for  two days on dilaudid PCA for pain in his back and left leg and discharged on MS contin 60mg  BID and short acting morphine and dilaudid so that he could try each one to see which worked best for him.  He returned to the hospital 2 days ago with 10/10 pain in the same places and received IV fluids and dilaudid. His pain score went down to 5/10 and he was discharged from the ER. He continued taking MS contin 60 mg BID and he had been taking the morphine IR 15mg  every 4 hours scheduled for the last two days without relief. He did not take ibuprofen but has been taking some tylenol. Back pain and left leg pain are currently 10/10 despite 6mg  of IV dilaudid in the ER and he is being admitted for pain control.    Consultants:  None  Procedures:  None  Antibiotics:  None  HPI/Subjective: Patient states that his pain is mostly in his back and his left lower extremity. He rates the pain as 8/10 presently and describes it as throbbing in nature. They're no associated symptoms and has no radiation of the pain.  Objective: Filed Vitals:   11/11/12 1037 11/11/12 1213 11/11/12 1328 11/11/12 1409  BP:   110/66   Pulse:   77   Temp:   98 F (36.7 C)   TempSrc:      Resp: 12 15 16 16   Height:      Weight:      SpO2: 100% 98% 97% 97%   Weight change:  Intake/Output Summary (Last 24 hours) at 11/11/12 1744 Last data filed at 11/11/12 1300  Gross per 24 hour  Intake 3805.89 ml  Output   1600 ml  Net 2205.89 ml    General: Alert, awake, oriented x3, in no acute distress.  HEENT: [place]/AT PEERL, EOMI, anicteric OROPHARYNX:  Moist, No exudate/ erythema/lesions.  Heart: Regular rate and rhythm, without murmurs, rubs, gallops.  Lungs: Clear to auscultation, no wheezing or rhonchi noted.  Abdomen: Soft, nontender, nondistended, positive bowel sounds, no masses no hepatosplenomegaly noted..  Neuro: No focal neurological deficits noted cranial nerves II through XII grossly intact. DTRs 2+  bilaterally upper and lower extremities. Strength functional in bilateral upper and lower extremities. Musculoskeletal: No warm swelling or erythema around joints, no spinal tenderness noted. Psychiatric: Patient alert and oriented x3, good insight and cognition, good recent to remote recall.   Data Reviewed: Basic Metabolic Panel:  Recent Labs Lab 11/08/12 1421 11/10/12 1342 11/11/12 0530  NA 137 138 139  K 3.1* 3.6 3.0*  CL 102 102 107  CO2 23 26 26   GLUCOSE 110* 100* 91  BUN 9 14 6   CREATININE 0.50 0.70 0.63  CALCIUM 9.6 9.1 8.4   Liver Function Tests:  Recent Labs Lab 11/08/12 1421 11/10/12 1400 11/11/12 0530  AST 21 26 17   ALT 13 12 10   ALKPHOS 85 77 59  BILITOT 1.5* 1.7* 1.6*  PROT 7.9 7.4 5.9*  ALBUMIN 4.6 4.4 3.4*   No results found for this basename: LIPASE, AMYLASE,  in the last 168 hours No results found for this basename: AMMONIA,  in the last 168 hours CBC:  Recent Labs Lab 11/08/12 1421 11/10/12 1342 11/11/12 0530  WBC 8.1 15.0* 10.1  NEUTROABS 6.9 8.6*  --   HGB 10.3* 9.3* 8.1*  HCT 29.8* 26.3* 23.3*  MCV 96.1 95.6 95.5  PLT 542* 504* 386   Cardiac Enzymes: No results found for this basename: CKTOTAL, CKMB, CKMBINDEX, TROPONINI,  in the last 168 hours BNP (last 3 results) No results found for this basename: PROBNP,  in the last 8760 hours CBG: No results found for this basename: GLUCAP,  in the last 168 hours  No results found for this or any previous visit (from the past 240 hour(s)).   Studies: Dg Chest 2 View  11/10/2012  *RADIOLOGY REPORT*  Clinical Data: Cough and fever  CHEST - 2 VIEW  Comparison: 11/08/2012  Findings: Left chest wall porta-catheter is noted with tip in the cavoatrial junction.  Mild cardiac enlargement is stable from previous exam.  There is pulmonary venous congestion.  No overt edema.  No pleural effusion identified.  There is no airspace consolidation. The skeletal changes of sickle cell disease are noted.   IMPRESSION:  1.  Cardiac enlargement and pulmonary venous congestion. 2.  No focal airspace consolidation identified.   Original Report Authenticated By: Signa Kell, M.D.    Dg Chest 2 View  11/08/2012  *RADIOLOGY REPORT*  Clinical Data: Shortness of breath and fever; history of sickle cell disease  CHEST - 2 VIEW  Comparison: October 21, 2012  Findings:  No edema or consolidation.  Heart size and pulmonary vascularity are normal.  No adenopathy.  Port-A-Cath tip is in the right atrium.  No pneumothorax.  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  IMPRESSION: Bony changes consistent with known sickle cell disease.  No edema or  consolidation.   Original Report Authenticated By: Bretta Bang, M.D.    Dg Chest 2 View  10/21/2012  *RADIOLOGY REPORT*  Clinical Data: Chest pain, nausea, fever and sickle cell crisis.  CHEST - 2 VIEW  Comparison: 10/06/2012  Findings: Stable positioning of left-sided dual lumen Port-A-Cath. The lungs show stable low volumes with scattered areas of parenchymal scarring.  Stable chronic mildly accentuated pulmonary vasculature without overt edema.  No focal pulmonary consolidation, edema, pneumothorax or pleural fluid is identified.  There is stable mild cardiac enlargement.  The bony thorax is unremarkable.  IMPRESSION: Stable chronic parenchymal scarring and vascular prominence of the lungs.  No acute findings.   Original Report Authenticated By: Irish Lack, M.D.     Scheduled Meds: . albuterol  2 puff Inhalation BID  . docusate sodium  100 mg Oral BID  . enoxaparin (LOVENOX) injection  40 mg Subcutaneous Q24H  . folic acid  1 mg Oral Daily  .  HYDROmorphone (DILAUDID) injection  4 mg Intravenous Q2H  . [START ON 11/12/2012] hydroxyurea  1,000 mg Oral Daily  . ketorolac  30 mg Intravenous Q6H  . morphine  60 mg Oral BID  . pantoprazole  40 mg  Oral Daily   Continuous Infusions:   Active Problems:   Sickle cell pain crisis   Sickle cell anemia   Leukocytosis   Thrombocytosis

## 2012-11-11 NOTE — ED Provider Notes (Signed)
Medical screening examination/treatment/procedure(s) were performed by non-physician practitioner and as supervising physician I was immediately available for consultation/collaboration.   Griffyn Kucinski L Machi Whittaker, MD 11/11/12 1445 

## 2012-11-12 LAB — CBC
MCH: 33.3 pg (ref 26.0–34.0)
MCHC: 34.9 g/dL (ref 30.0–36.0)
MCV: 95.5 fL (ref 78.0–100.0)
Platelets: 423 10*3/uL — ABNORMAL HIGH (ref 150–400)
RDW: 16.9 % — ABNORMAL HIGH (ref 11.5–15.5)

## 2012-11-12 LAB — COMPREHENSIVE METABOLIC PANEL
AST: 20 U/L (ref 0–37)
Albumin: 3.4 g/dL — ABNORMAL LOW (ref 3.5–5.2)
Alkaline Phosphatase: 62 U/L (ref 39–117)
Chloride: 110 mEq/L (ref 96–112)
Potassium: 3.7 mEq/L (ref 3.5–5.1)
Total Bilirubin: 1.5 mg/dL — ABNORMAL HIGH (ref 0.3–1.2)

## 2012-11-12 MED ORDER — HYDROMORPHONE 0.3 MG/ML IV SOLN
INTRAVENOUS | Status: DC
  Administered 2012-11-12: 12:00:00 via INTRAVENOUS
  Administered 2012-11-12: 14.49 mg via INTRAVENOUS
  Administered 2012-11-12 (×2): via INTRAVENOUS
  Administered 2012-11-12: 0.3 mg via INTRAVENOUS
  Administered 2012-11-12 – 2012-11-13 (×4): via INTRAVENOUS
  Administered 2012-11-13: 15.67 mg via INTRAVENOUS
  Administered 2012-11-13 (×3): via INTRAVENOUS
  Administered 2012-11-13: 6.49 mg via INTRAVENOUS
  Administered 2012-11-13: 09:00:00 via INTRAVENOUS
  Administered 2012-11-13: 10.89 mg via INTRAVENOUS
  Administered 2012-11-13: 10.66 mg via INTRAVENOUS
  Administered 2012-11-13: 10.99 mg via INTRAVENOUS
  Administered 2012-11-14: 01:00:00 via INTRAVENOUS
  Administered 2012-11-14: 3.41 mg via INTRAVENOUS
  Administered 2012-11-14: 09:00:00 via INTRAVENOUS
  Administered 2012-11-14: 12.9 mg via INTRAVENOUS
  Administered 2012-11-14: 07:00:00 via INTRAVENOUS
  Filled 2012-11-12 (×15): qty 25

## 2012-11-12 MED ORDER — ALBUTEROL SULFATE HFA 108 (90 BASE) MCG/ACT IN AERS
2.0000 | INHALATION_SPRAY | Freq: Four times a day (QID) | RESPIRATORY_TRACT | Status: DC | PRN
  Filled 2012-11-12: qty 6.7

## 2012-11-12 NOTE — H&P (Signed)
RT assessment and treatment protocol completed. Patient agrees that current BD schedule works for him and his symptoms. He does, however, state that he intermittently has increased SOB more so at night than during the daytime hours. Scheduled BD remain the same. Order added for prn BD per protocol.

## 2012-11-12 NOTE — Progress Notes (Signed)
SICKLE CELL SERVICE PROGRESS NOTE  [name] UXL:[id number] DOB: 07-Jan-1986 DOA: 11/10/2012 PCP: Willey Blade, MD  Assessment/Plan:   Sickle cell pain crisis: Pt reported that the MS Contin seems to be ineffective in managing his pain at home and he is requiring short-acting medications on a daily basis as compared to several months ago. Patient wants to go back to the PCA. He states that the PCA was doing better than the boluses. Pain is still 9/10.     Sickle cell anemia: The patient admitted that he has been noncompliant with taking his Hydrea secondary to cost associated with it.     Leukocytosis: now resolved.    Thrombocytosis: Platelets are now within normal range.   Code Status: Full code Family Communication: Not applicable Disposition Plan: Home at the time of discharge  Dennis Bernard  Pager [phone]. If 7PM-7AM, please contact night-coverage.  11/12/2012, 12:46 PM  LOS: 2 days   Brief narrative: The patient is a 27 y.o. year-old male with history of sickle cell anemia and chronic pain who presents with pain crisis. The patient was last at his baseline health when he was discharged from the hospital a week ago. He was admitted for two days on dilaudid PCA for pain in his back and left leg and discharged on MS contin 60mg  BID and short acting morphine and dilaudid so that he could try each one to see which worked best for him.  He returned to the hospital 2 days ago with 10/10 pain in the same places and received IV fluids and dilaudid. His pain score went down to 5/10 and he was discharged from the ER. He continued taking MS contin 60 mg BID and he had been taking the morphine IR 15mg  every 4 hours scheduled for the last two days without relief. He did not take ibuprofen but has been taking some tylenol. Back pain and left leg pain are currently 10/10 despite 6mg  of IV dilaudid in the ER and he is being admitted for pain control.     Consultants:  None  Procedures:  None  Antibiotics:  None  HPI/Subjective: Patient stated that his pain is 9/10. He would like to go back to the PCA.   Objective: Filed Vitals:   11/12/12 0544 11/12/12 0743 11/12/12 1010 11/12/12 1147  BP: 105/63  109/64   Pulse: 73  81   Temp: 97.7 F (36.5 C)  98 F (36.7 C)   TempSrc: Oral  Oral   Resp: 18  16 16   Height:      Weight:      SpO2: 100% 100% 100% 96%   Weight change:   Intake/Output Summary (Last 24 hours) at 11/12/12 1246 Last data filed at 11/11/12 1437  Gross per 24 hour  Intake 1006.25 ml  Output      0 ml  Net 1006.25 ml    General: Alert, awake, oriented x3, in no acute distress.  HEENT: [place]/AT PEERL, EOMI, anicteric OROPHARYNX:  Moist, No exudate/ erythema/lesions.  Heart: Regular rate and rhythm, without murmurs, rubs, gallops.  Lungs: Clear to auscultation, no wheezing or rhonchi noted.  Abdomen: Soft, nontender, nondistended, positive bowel sounds, no masses no hepatosplenomegaly noted..  Neuro: No focal neurological deficits noted cranial nerves II through XII grossly intact. DTRs 2+ bilaterally upper and lower extremities. Strength functional in bilateral upper and lower extremities. Musculoskeletal: No warm swelling or erythema around joints, no spinal tenderness noted. Psychiatric: Patient alert and oriented x3, good insight and cognition,  good recent to remote recall.   Data Reviewed: Basic Metabolic Panel:  Recent Labs Lab 11/08/12 1421 11/10/12 1342 11/11/12 0530 11/12/12 0600  NA 137 138 139 143  K 3.1* 3.6 3.0* 3.7  CL 102 102 107 110  CO2 23 26 26 27   GLUCOSE 110* 100* 91 88  BUN 9 14 6 7   CREATININE 0.50 0.70 0.63 0.65  CALCIUM 9.6 9.1 8.4 8.3*   Liver Function Tests:  Recent Labs Lab 11/08/12 1421 11/10/12 1400 11/11/12 0530 11/12/12 0600  AST 21 26 17 20   ALT 13 12 10 11   ALKPHOS 85 77 59 62  BILITOT 1.5* 1.7* 1.6* 1.5*  PROT 7.9 7.4 5.9* 6.2  ALBUMIN 4.6 4.4  3.4* 3.4*   CBC:  Recent Labs Lab 11/08/12 1421 11/10/12 1342 11/11/12 0530 11/12/12 0600  WBC 8.1 15.0* 10.1 8.9  NEUTROABS 6.9 8.6*  --   --   HGB 10.3* 9.3* 8.1* 8.1*  HCT 29.8* 26.3* 23.3* 23.2*  MCV 96.1 95.6 95.5 95.5  PLT 542* 504* 386 423*    Studies: Dg Chest 2 View  11/10/2012  *RADIOLOGY REPORT*  Clinical Data: Cough and fever  CHEST - 2 VIEW  Comparison: 11/08/2012  Findings: Left chest wall porta-catheter is noted with tip in the cavoatrial junction.  Mild cardiac enlargement is stable from previous exam.  There is pulmonary venous congestion.  No overt edema.  No pleural effusion identified.  There is no airspace consolidation. The skeletal changes of sickle cell disease are noted.  IMPRESSION:  1.  Cardiac enlargement and pulmonary venous congestion. 2.  No focal airspace consolidation identified.   Original Report Authenticated By: Signa Kell, M.D.    Dg Chest 2 View  11/08/2012  *RADIOLOGY REPORT*  Clinical Data: Shortness of breath and fever; history of sickle cell disease  CHEST - 2 VIEW  Comparison: October 21, 2012  Findings:  No edema or consolidation.  Heart size and pulmonary vascularity are normal.  No adenopathy.  Port-A-Cath tip is in the right atrium.  No pneumothorax.  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  IMPRESSION: Bony changes consistent with known sickle cell disease.  No edema or consolidation.   Original Report Authenticated By: Bretta Bang, M.D.    Dg Chest 2 View  10/21/2012  *RADIOLOGY REPORT*  Clinical Data: Chest pain, nausea, fever and sickle cell crisis.  CHEST - 2 VIEW  Comparison: 10/06/2012  Findings: Stable positioning of left-sided dual lumen Port-A-Cath. The lungs show stable low volumes with scattered areas of parenchymal scarring.  Stable chronic mildly accentuated pulmonary vasculature without overt  edema.  No focal pulmonary consolidation, edema, pneumothorax or pleural fluid is identified.  There is stable mild cardiac enlargement.  The bony thorax is unremarkable.  IMPRESSION: Stable chronic parenchymal scarring and vascular prominence of the lungs.  No acute findings.   Original Report Authenticated By: Irish Lack, M.D.     Scheduled Meds: . albuterol  2 puff Inhalation BID  . docusate sodium  100 mg Oral BID  . enoxaparin (LOVENOX) injection  40 mg Subcutaneous Q24H  . folic acid  1 mg Oral Daily  .  HYDROmorphone (DILAUDID) injection  4 mg Intravenous Q2H  . HYDROmorphone PCA 0.3 mg/mL   Intravenous Q4H  . hydroxyurea  1,000 mg Oral Daily  . ketorolac  30 mg Intravenous Q6H  . morphine  60  mg Oral BID  . pantoprazole  40 mg Oral Daily

## 2012-11-13 LAB — RETICULOCYTES
RBC.: 2.41 MIL/uL — ABNORMAL LOW (ref 4.22–5.81)
Retic Ct Pct: 6.8 % — ABNORMAL HIGH (ref 0.4–3.1)

## 2012-11-13 LAB — CBC
MCHC: 34.3 g/dL (ref 30.0–36.0)
RDW: 17.3 % — ABNORMAL HIGH (ref 11.5–15.5)

## 2012-11-13 MED ORDER — MORPHINE SULFATE ER 30 MG PO TBCR
30.0000 mg | EXTENDED_RELEASE_TABLET | Freq: Two times a day (BID) | ORAL | Status: DC
  Administered 2012-11-13 – 2012-11-14 (×2): 30 mg via ORAL
  Filled 2012-11-13 (×2): qty 1

## 2012-11-13 NOTE — Progress Notes (Signed)
SICKLE CELL SERVICE PROGRESS NOTE  Dennis Bernard ZOX:[id number] DOB: 02-Mar-1986 DOA: 11/10/2012 PCP: [name] [name], MD  Assessment/Plan:   Sickle cell pain crisis: Patient states today that his pain is 9/10. Will continue Dilaudid PCA.    Sickle cell anemia: The patient admitted that he has been noncompliant with taking his Hydrea secondary to cost associated with it.     Leukocytosis: now resolved.  Thrombocytosis: Platelets are now within normal range.   Code Status: Full code Family Communication: Not applicable Disposition Plan: Home at the time of discharge  DAVIS, NOVLET JARRETT  Pager [phone]. If 7PM-7AM, please contact night-coverage.  11/12/2012, 12:46 PM  LOS: 2 days   Brief narrative: The patient is a 27 y.o. year-old male with history of sickle cell anemia and chronic pain who presents with pain crisis. The patient was last at his baseline health when he was discharged from the hospital a week ago. He was admitted for two days on dilaudid PCA for pain in his back and left leg and discharged on MS contin 60mg  BID and short acting morphine and dilaudid so that he could try each one to see which worked best for him.  He returned to the hospital 2 days ago with 10/10 pain in the same places and received IV fluids and dilaudid. His pain score went down to 5/10 and he was discharged from the ER. He continued taking MS contin 60 mg BID and he had been taking the morphine IR 15mg  every 4 hours scheduled for the last two days without relief. He did not take ibuprofen but has been taking some tylenol. Back pain and left leg pain are currently 10/10 despite 6mg  of IV dilaudid in the ER and he is being admitted for pain control.    Consultants:  None  Procedures:  None  Antibiotics:  None  HPI/Subjective: Patient stated that his pain is 9/10.    Objective: Filed Vitals:   11/12/12 0544 11/12/12 0743 11/12/12 1010 11/12/12 1147  BP: 105/63  109/64   Pulse: 73  81    Temp: 97.7 F (36.5 C)  98 F (36.7 C)   TempSrc: Oral  Oral   Resp: 18  16 16   Height:      Weight:      SpO2: 100% 100% 100% 96%   Weight change:   Intake/Output Summary (Last 24 hours) at 11/12/12 1246 Last data filed at 11/11/12 1437  Gross per 24 hour  Intake 1006.25 ml  Output      0 ml  Net 1006.25 ml    General: Alert, awake, oriented x3, in no acute distress.  HEENT: [place]/AT PEERL, EOMI, anicteric OROPHARYNX:  Moist, No exudate/ erythema/lesions.  Heart: Regular rate and rhythm, without murmurs, rubs, gallops.  Lungs: Clear to auscultation, no wheezing or rhonchi noted.  Abdomen: Soft, nontender, nondistended, positive bowel sounds, no masses no hepatosplenomegaly noted..  Neuro: No focal neurological deficits noted cranial nerves II through XII grossly intact. DTRs 2+ bilaterally upper and lower extremities. Strength functional in bilateral upper and lower extremities. Musculoskeletal: No warm swelling or erythema around joints, no spinal tenderness noted. Psychiatric: Patient alert and oriented x3, good insight and cognition, good recent to remote recall.   Data Reviewed: Basic Metabolic Panel:  Recent Labs Lab 11/08/12 1421 11/10/12 1342 11/11/12 0530 11/12/12 0600  NA 137 138 139 143  K 3.1* 3.6 3.0* 3.7  CL 102 102 107 110  CO2 23 26 26 27   GLUCOSE 110* 100* 91 88  BUN 9 14 6 7   CREATININE 0.50 0.70 0.63 0.65  CALCIUM 9.6 9.1 8.4 8.3*   Liver Function Tests:  Recent Labs Lab 11/08/12 1421 11/10/12 1400 11/11/12 0530 11/12/12 0600  AST 21 26 17 20   ALT 13 12 10 11   ALKPHOS 85 77 59 62  BILITOT 1.5* 1.7* 1.6* 1.5*  PROT 7.9 7.4 5.9* 6.2  ALBUMIN 4.6 4.4 3.4* 3.4*   CBC:  Recent Labs Lab 11/08/12 1421 11/10/12 1342 11/11/12 0530 11/12/12 0600  WBC 8.1 15.0* 10.1 8.9  NEUTROABS 6.9 8.6*  --   --   HGB 10.3* 9.3* 8.1* 8.1*  HCT 29.8* 26.3* 23.3* 23.2*  MCV 96.1 95.6 95.5 95.5  PLT 542* 504* 386 423*    Studies: Dg Chest 2  View  11/10/2012  *RADIOLOGY REPORT*  Clinical Data: Cough and fever  CHEST - 2 VIEW  Comparison: 11/08/2012  Findings: Left chest wall porta-catheter is noted with tip in the cavoatrial junction.  Mild cardiac enlargement is stable from previous exam.  There is pulmonary venous congestion.  No overt edema.  No pleural effusion identified.  There is no airspace consolidation. The skeletal changes of sickle cell disease are noted.  IMPRESSION:  1.  Cardiac enlargement and pulmonary venous congestion. 2.  No focal airspace consolidation identified.   Original Report Authenticated By: Signa Kell, M.D.    Dg Chest 2 View  11/08/2012  *RADIOLOGY REPORT*  Clinical Data: Shortness of breath and fever; history of sickle cell disease  CHEST - 2 VIEW  Comparison: October 21, 2012  Findings:  No edema or consolidation.  Heart size and pulmonary vascularity are normal.  No adenopathy.  Port-A-Cath tip is in the right atrium.  No pneumothorax.  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  IMPRESSION: Bony changes consistent with known sickle cell disease.  No edema or consolidation.   Original Report Authenticated By: Bretta Bang, M.D.    Dg Chest 2 View  10/21/2012  *RADIOLOGY REPORT*  Clinical Data: Chest pain, nausea, fever and sickle cell crisis.  CHEST - 2 VIEW  Comparison: 10/06/2012  Findings: Stable positioning of left-sided dual lumen Port-A-Cath. The lungs show stable low volumes with scattered areas of parenchymal scarring.  Stable chronic mildly accentuated pulmonary vasculature without overt edema.  No focal pulmonary consolidation, edema, pneumothorax or pleural fluid is identified.  There is stable mild cardiac enlargement.  The bony thorax is unremarkable.  IMPRESSION: Stable chronic parenchymal scarring and vascular prominence of the lungs.  No acute findings.   Original Report  Authenticated By: Irish Lack, M.D.     Scheduled Meds: . albuterol  2 puff Inhalation BID  . docusate sodium  100 mg Oral BID  . enoxaparin (LOVENOX) injection  40 mg Subcutaneous Q24H  . folic acid  1 mg Oral Daily  .  HYDROmorphone (DILAUDID) injection  4 mg Intravenous Q2H  . HYDROmorphone PCA 0.3 mg/mL   Intravenous Q4H  . hydroxyurea  1,000 mg Oral Daily  . ketorolac  30 mg Intravenous Q6H  . morphine  60 mg Oral BID  . pantoprazole  40 mg Oral Daily     Time spent with patient approximately 25 minutes

## 2012-11-14 LAB — COMPREHENSIVE METABOLIC PANEL
Alkaline Phosphatase: 59 U/L (ref 39–117)
BUN: 7 mg/dL (ref 6–23)
CO2: 29 mEq/L (ref 19–32)
GFR calc Af Amer: >90 mL/min (ref >90)
GFR calc non Af Amer: >90 mL/min (ref >90)
Glucose, Bld: 100 mg/dL — ABNORMAL HIGH (ref 70–99)
Potassium: 3.5 mEq/L (ref 3.5–5.1)
Total Bilirubin: 1.1 mg/dL (ref 0.3–1.2)
Total Protein: 5.8 g/dL — ABNORMAL LOW (ref 6.0–8.3)

## 2012-11-14 LAB — CBC
HCT: 23.4 % — ABNORMAL LOW (ref 39.0–52.0)
Hemoglobin: 8 g/dL — ABNORMAL LOW (ref 13.0–17.0)
MCHC: 34.2 g/dL (ref 30.0–36.0)

## 2012-11-14 MED ORDER — HYDROMORPHONE HCL PF 2 MG/ML IJ SOLN
2.0000 mg | INTRAMUSCULAR | Status: DC | PRN
  Administered 2012-11-14: 2 mg via INTRAVENOUS
  Filled 2012-11-14: qty 1

## 2012-11-14 MED ORDER — HEPARIN SOD (PORK) LOCK FLUSH 100 UNIT/ML IV SOLN
INTRAVENOUS | Status: AC
  Administered 2012-11-14: 500 [IU]
  Filled 2012-11-14: qty 5

## 2012-11-14 MED ORDER — MORPHINE SULFATE 15 MG PO TABS: 15.0000 mg | ORAL_TABLET | ORAL | Status: DC | PRN

## 2012-11-14 MED ORDER — MORPHINE SULFATE ER 60 MG PO TBCR: 60.0000 mg | EXTENDED_RELEASE_TABLET | Freq: Two times a day (BID) | ORAL | Status: DC

## 2012-11-14 NOTE — Discharge Summary (Signed)
Physician Discharge Summary  Dennis Bernard ZOX:[id number] DOB: 1986-01-10 DOA: 11/10/2012  PCP: Willey Blade, MD  Admit date: 11/10/2012 Discharge date: 11/14/2012  Recommendations for Outpatient Follow-up:   Follow-up Information   Follow up with MATTHEWS,MICHELLE A., MD In 1 week.   Contact information:   [address] [place] [address] [phone]      Discharge Diagnoses:   Sickle cell pain crisis  Sickle cell anemia  Leukocytosis  Thrombocytosis 1.   Discharge Condition: Stable Disposition: Home today  Diet recommendation: Carb modified Filed Weights   11/10/12 2025  Weight: 80.8 kg (178 lb 2.1 oz)    History of present illness:  The patient is a 27 y.o. year-old male with history of sickle cell anemia and chronic pain who presents with pain crisis. The patient was last at his baseline health when he was discharged from the hospital a week ago. He was admitted for two days on dilaudid PCA for pain in his back and left leg and discharged on MS contin 60mg  BID and short acting morphine and dilaudid so that he could try each one to see which worked best for him.  He returned to the hospital 2 days ago with 10/10 pain in the same places and received IV fluids and dilaudid. His pain score went down to 5/10 and he was discharged from the ER. He continued taking MS contin 60 mg BID and he had been taking the morphine IR 15mg  every 4 hours scheduled for the last two days without relief. He did not take ibuprofen but has been taking some tylenol. Back pain and left leg pain are currently 10/10 despite 6mg  of IV dilaudid in the ER and he is being admitted for pain control.  Additionally, he thinks he had a fever to 101F yesterday. He has had some chest congestion with some shortness of breath and wheezing for the last day. He is not coughing anything up. He has also had some sinus congestion. He did receive his flu shot, denies sick contacts. He has had this kind of  wheezing before with previous URI. Denies chest pain and tightness. He took tylenol for his fever which helped. Mild nausea without vomiting and denies diarrhea. Last BM was yesterday. Currently afebrile in the ER and CXR demonstrates no infiltrate but does demonstrate cardiomegaly with pulmonary vascular congestion.    Hospital Course:  Sickle cell pain crisis: Patient was admitted with sickle cell crisis. He was treated with IV Dilaudid PCA which he states was ineffective; he was switched to boluses of Dilaudid. Then he wanted to switch  back to the PCA as he thought that was better than the boluses. He was switched back to PCA. On the day of discharge he states that his pain has improved. Pain was 4/10. He was discharged on his home MS Contin and MSIR. He was given a note for school. He was told followup outpatient.  Sickle cell anemia: The patient admitted that he has been noncompliant with taking his Hydrea secondary to cost associated with it. Encouraged him to take his Hydrea on discharge. Leukocytosis: Leukocytosis has resolved. Leukocytosis most likely secondary to vaso-occlusive crisis. Patient's white count is back to baseline. Thrombocytosis: Platelets are slightly elevated during this admission. Most likely secondary to inflammatory process from sickle cell crisis. this can be monitored outpatient..       Discharge Instructions  Discharge Orders   Future Orders Complete By Expires     Diet general  As directed  Medication List    STOP taking these medications       HYDROmorphone 4 MG tablet  Commonly known as:  DILAUDID      TAKE these medications       albuterol 108 (90 BASE) MCG/ACT inhaler  Commonly known as:  PROVENTIL HFA;VENTOLIN HFA  Inhale 2 puffs into the lungs every 6 (six) hours as needed for wheezing or shortness of breath. wheezing     diphenhydrAMINE 25 MG tablet  Commonly known as:  BENADRYL  Take 25 mg by mouth every 8 (eight) hours as needed.  For itching.     folic acid 1 MG tablet  Commonly known as:  FOLVITE  Take 1 mg by mouth daily.     hydroxyurea 500 MG capsule  Commonly known as:  HYDREA  Take 2,000 mg by mouth daily. May take with food to minimize GI side effects.     morphine 60 MG 12 hr tablet  Commonly known as:  MS CONTIN  Take 1 tablet (60 mg total) by mouth 2 (two) times daily.     morphine 15 MG tablet  Commonly known as:  MSIR  Take 1 tablet (15 mg total) by mouth every 4 (four) hours as needed for pain. Pain     promethazine 25 MG tablet  Commonly known as:  PHENERGAN  Take 25 mg by mouth every 6 (six) hours as needed for nausea. Nausea     senna-docusate 8.6-50 MG per tablet  Commonly known as:  Senokot-S  Take 1 tablet by mouth daily as needed. Constipation           Follow-up Information   Follow up with MATTHEWS,MICHELLE A., MD In 1 week.   Contact information:   [address] [place] [address] [phone]        The results of significant diagnostics from this hospitalization (including imaging, microbiology, ancillary and laboratory) are listed below for reference.    Significant Diagnostic Studies: Dg Chest 2 View  11/10/2012  *RADIOLOGY REPORT*  Clinical Data: Cough and fever  CHEST - 2 VIEW  Comparison: 11/08/2012  Findings: Left chest wall porta-catheter is noted with tip in the cavoatrial junction.  Mild cardiac enlargement is stable from previous exam.  There is pulmonary venous congestion.  No overt edema.  No pleural effusion identified.  There is no airspace consolidation. The skeletal changes of sickle cell disease are noted.  IMPRESSION:  1.  Cardiac enlargement and pulmonary venous congestion. 2.  No focal airspace consolidation identified.   Original Report Authenticated By: Signa Kell, M.D.    Dg Chest 2 View  11/08/2012  *RADIOLOGY REPORT*  Clinical Data: Shortness of breath and fever; history of sickle cell disease  CHEST - 2 VIEW  Comparison:  October 21, 2012  Findings:  No edema or consolidation.  Heart size and pulmonary vascularity are normal.  No adenopathy.  Port-A-Cath tip is in the right atrium.  No pneumothorax.  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  IMPRESSION: Bony changes consistent with known sickle cell disease.  No edema or consolidation.   Original Report Authenticated By: Bretta Bang, M.D.    Dg Chest 2 View  10/21/2012  *RADIOLOGY REPORT*  Clinical Data: Chest pain, nausea, fever and sickle cell crisis.  CHEST - 2 VIEW  Comparison: 10/06/2012  Findings: Stable positioning of left-sided dual lumen  Port-A-Cath. The lungs show stable low volumes with scattered areas of parenchymal scarring.  Stable chronic mildly accentuated pulmonary vasculature without overt edema.  No focal pulmonary consolidation, edema, pneumothorax or pleural fluid is identified.  There is stable mild cardiac enlargement.  The bony thorax is unremarkable.  IMPRESSION: Stable chronic parenchymal scarring and vascular prominence of the lungs.  No acute findings.   Original Report Authenticated By: Irish Lack, M.D.      Labs: Basic Metabolic Panel:  Recent Labs Lab 11/08/12 1421 11/10/12 1342 11/11/12 0530 11/12/12 0600 11/14/12 0550  NA 137 138 139 143 143  K 3.1* 3.6 3.0* 3.7 3.5  CL 102 102 107 110 109  CO2 23 26 26 27 29   GLUCOSE 110* 100* 91 88 100*  BUN 9 14 6 7 7   CREATININE 0.50 0.70 0.63 0.65 0.70  CALCIUM 9.6 9.1 8.4 8.3* 8.4   Liver Function Tests:  Recent Labs Lab 11/08/12 1421 11/10/12 1400 11/11/12 0530 11/12/12 0600 11/14/12 0550  AST 21 26 17 20 23   ALT 13 12 10 11 12   ALKPHOS 85 77 59 62 59  BILITOT 1.5* 1.7* 1.6* 1.5* 1.1  PROT 7.9 7.4 5.9* 6.2 5.8*  ALBUMIN 4.6 4.4 3.4* 3.4* 3.3*   CBC:  Recent Labs Lab 11/08/12 1421 11/10/12 1342 11/11/12 0530 11/12/12 0600  11/13/12 0437 11/14/12 0550  WBC 8.1 15.0* 10.1 8.9 9.4 9.1  NEUTROABS 6.9 8.6*  --   --   --   --   HGB 10.3* 9.3* 8.1* 8.1* 8.0* 8.0*  HCT 29.8* 26.3* 23.3* 23.2* 23.3* 23.4*  MCV 96.1 95.6 95.5 95.5 96.7 96.7  PLT 542* 504* 386 423* 425* 429*   Cardiac Enzymes: No results found for this basename: CKTOTAL, CKMB, CKMBINDEX, TROPONINI,  in the last 168 hours BNP: BNP (last 3 results) No results found for this basename: PROBNP,  in the last 8760 hours CBG:     Time coordinating discharge: 45 min Signed:  Molli Posey, MD Sickle Cell  11/14/2012, 11:46 AM

## 2012-11-22 ENCOUNTER — Inpatient Hospital Stay (HOSPITAL_COMMUNITY)
Admission: EM | Admit: 2012-11-22 | Discharge: 2012-11-27 | DRG: 812 | Disposition: A | Discharge status: Home / Self Care | Payer: Medicare Other | Attending: Internal Medicine | Admitting: Internal Medicine

## 2012-11-22 ENCOUNTER — Encounter (HOSPITAL_COMMUNITY): Payer: Self-pay | Admitting: Physical Medicine and Rehabilitation

## 2012-11-22 ENCOUNTER — Emergency Department (HOSPITAL_COMMUNITY): Payer: Medicare Other

## 2012-11-22 DIAGNOSIS — M545 Low back pain: Secondary | ICD-10-CM

## 2012-11-22 DIAGNOSIS — D5700 Hb-SS disease with crisis, unspecified: Principal | ICD-10-CM | POA: Diagnosis present

## 2012-11-22 DIAGNOSIS — Z79899 Other long term (current) drug therapy: Secondary | ICD-10-CM

## 2012-11-22 DIAGNOSIS — Z833 Family history of diabetes mellitus: Secondary | ICD-10-CM

## 2012-11-22 DIAGNOSIS — E876 Hypokalemia: Secondary | ICD-10-CM | POA: Diagnosis present

## 2012-11-22 DIAGNOSIS — D571 Sickle-cell disease without crisis: Secondary | ICD-10-CM | POA: Diagnosis present

## 2012-11-22 DIAGNOSIS — Z96619 Presence of unspecified artificial shoulder joint: Secondary | ICD-10-CM

## 2012-11-22 DIAGNOSIS — T50995A Adverse effect of other drugs, medicaments and biological substances, initial encounter: Secondary | ICD-10-CM | POA: Diagnosis present

## 2012-11-22 DIAGNOSIS — Z825 Family history of asthma and other chronic lower respiratory diseases: Secondary | ICD-10-CM

## 2012-11-22 DIAGNOSIS — K219 Gastro-esophageal reflux disease without esophagitis: Secondary | ICD-10-CM | POA: Diagnosis present

## 2012-11-22 DIAGNOSIS — Z832 Family history of diseases of the blood and blood-forming organs and certain disorders involving the immune mechanism: Secondary | ICD-10-CM

## 2012-11-22 DIAGNOSIS — G8929 Other chronic pain: Secondary | ICD-10-CM

## 2012-11-22 DIAGNOSIS — F1120 Opioid dependence, uncomplicated: Secondary | ICD-10-CM | POA: Diagnosis present

## 2012-11-22 DIAGNOSIS — R112 Nausea with vomiting, unspecified: Secondary | ICD-10-CM | POA: Diagnosis present

## 2012-11-22 DIAGNOSIS — G894 Chronic pain syndrome: Secondary | ICD-10-CM | POA: Diagnosis present

## 2012-11-22 DIAGNOSIS — M549 Dorsalgia, unspecified: Secondary | ICD-10-CM | POA: Diagnosis present

## 2012-11-22 LAB — CBC WITH DIFFERENTIAL/PLATELET
Basophils Absolute: 0 10*3/uL (ref 0.0–0.1)
Eosinophils Absolute: 0 10*3/uL (ref 0.0–0.7)
HCT: 22.2 % — ABNORMAL LOW (ref 39.0–52.0)
Lymphocytes Relative: 29 % (ref 12–46)
Monocytes Relative: 10 % (ref 3–12)
Neutrophils Relative %: 61 % (ref 43–77)
Platelets: 312 10*3/uL (ref 150–400)
RDW: 18.5 % — ABNORMAL HIGH (ref 11.5–15.5)
WBC: 8.8 10*3/uL (ref 4.0–10.5)

## 2012-11-22 LAB — COMPREHENSIVE METABOLIC PANEL
AST: 27 U/L (ref 0–37)
Albumin: 4 g/dL (ref 3.5–5.2)
Alkaline Phosphatase: 97 U/L (ref 39–117)
Chloride: 107 mEq/L (ref 96–112)
Potassium: 3.4 mEq/L — ABNORMAL LOW (ref 3.5–5.1)
Sodium: 141 mEq/L (ref 135–145)
Total Bilirubin: 1.6 mg/dL — ABNORMAL HIGH (ref 0.3–1.2)
Total Protein: 7 g/dL (ref 6.0–8.3)

## 2012-11-22 LAB — RETICULOCYTES: RBC.: 2.28 MIL/uL — ABNORMAL LOW (ref 4.22–5.81)

## 2012-11-22 MED ORDER — KETOROLAC TROMETHAMINE 30 MG/ML IJ SOLN
30.0000 mg | Freq: Three times a day (TID) | INTRAMUSCULAR | Status: AC
  Administered 2012-11-23 – 2012-11-24 (×6): 30 mg via INTRAVENOUS
  Filled 2012-11-22 (×6): qty 1

## 2012-11-22 MED ORDER — DIPHENHYDRAMINE HCL 12.5 MG/5ML PO ELIX
12.5000 mg | ORAL_SOLUTION | Freq: Four times a day (QID) | ORAL | Status: DC | PRN
  Filled 2012-11-22: qty 5

## 2012-11-22 MED ORDER — ONDANSETRON HCL 4 MG/2ML IJ SOLN
4.0000 mg | Freq: Four times a day (QID) | INTRAMUSCULAR | Status: DC | PRN
  Administered 2012-11-25 (×3): 4 mg via INTRAVENOUS
  Filled 2012-11-22 (×4): qty 2

## 2012-11-22 MED ORDER — DIPHENHYDRAMINE HCL 50 MG/ML IJ SOLN: 12.5000 mg | Freq: Four times a day (QID) | INTRAMUSCULAR | Status: DC | PRN

## 2012-11-22 MED ORDER — HYDROMORPHONE HCL PF 2 MG/ML IJ SOLN
4.0000 mg | Freq: Once | INTRAMUSCULAR | Status: AC
  Administered 2012-11-22: 4 mg via INTRAVENOUS
  Filled 2012-11-22: qty 2

## 2012-11-22 MED ORDER — ALBUTEROL SULFATE HFA 108 (90 BASE) MCG/ACT IN AERS: 2.0000 | INHALATION_SPRAY | Freq: Four times a day (QID) | RESPIRATORY_TRACT | Status: DC | PRN

## 2012-11-22 MED ORDER — SODIUM CHLORIDE 0.9 % IJ SOLN: 9.0000 mL | INTRAMUSCULAR | Status: DC | PRN

## 2012-11-22 MED ORDER — MORPHINE SULFATE ER 30 MG PO TBCR
60.0000 mg | EXTENDED_RELEASE_TABLET | Freq: Two times a day (BID) | ORAL | Status: DC
  Administered 2012-11-23 – 2012-11-24 (×4): 60 mg via ORAL
  Filled 2012-11-22 (×2): qty 2
  Filled 2012-11-22 (×2): qty 1
  Filled 2012-11-22: qty 2

## 2012-11-22 MED ORDER — DIPHENHYDRAMINE HCL 50 MG/ML IJ SOLN
25.0000 mg | Freq: Once | INTRAMUSCULAR | Status: AC
  Administered 2012-11-22: 25 mg via INTRAVENOUS
  Filled 2012-11-22: qty 1

## 2012-11-22 MED ORDER — HYDROXYUREA 500 MG PO CAPS
2000.0000 mg | ORAL_CAPSULE | Freq: Every day | ORAL | Status: DC
  Administered 2012-11-23 – 2012-11-27 (×5): 2000 mg via ORAL
  Filled 2012-11-22 (×6): qty 4

## 2012-11-22 MED ORDER — SENNOSIDES-DOCUSATE SODIUM 8.6-50 MG PO TABS
1.0000 | ORAL_TABLET | Freq: Every day | ORAL | Status: DC | PRN
  Filled 2012-11-22: qty 1

## 2012-11-22 MED ORDER — ONDANSETRON HCL 4 MG/2ML IJ SOLN
4.0000 mg | Freq: Once | INTRAMUSCULAR | Status: AC
  Administered 2012-11-22: 4 mg via INTRAVENOUS
  Filled 2012-11-22: qty 2

## 2012-11-22 MED ORDER — DEXTROSE-NACL 5-0.45 % IV SOLN
INTRAVENOUS | Status: DC
  Administered 2012-11-23 – 2012-11-24 (×6): via INTRAVENOUS
  Administered 2012-11-24: 1000 mL via INTRAVENOUS
  Administered 2012-11-25 – 2012-11-26 (×4): via INTRAVENOUS

## 2012-11-22 MED ORDER — HYDROMORPHONE 0.3 MG/ML IV SOLN
INTRAVENOUS | Status: DC
  Administered 2012-11-23: 5.56 mg via INTRAVENOUS
  Administered 2012-11-23: 01:00:00 via INTRAVENOUS
  Filled 2012-11-22 (×2): qty 25

## 2012-11-22 MED ORDER — FOLIC ACID 1 MG PO TABS
1.0000 mg | ORAL_TABLET | Freq: Every day | ORAL | Status: DC
  Administered 2012-11-23 – 2012-11-27 (×4): 1 mg via ORAL
  Filled 2012-11-22 (×5): qty 1

## 2012-11-22 MED ORDER — NALOXONE HCL 0.4 MG/ML IJ SOLN: 0.4000 mg | INTRAMUSCULAR | Status: DC | PRN

## 2012-11-22 MED ORDER — MORPHINE SULFATE 15 MG PO TABS
15.0000 mg | ORAL_TABLET | ORAL | Status: DC | PRN
  Administered 2012-11-23 (×3): 15 mg via ORAL
  Filled 2012-11-22: qty 3
  Filled 2012-11-22 (×3): qty 1

## 2012-11-22 MED ORDER — PROMETHAZINE HCL 25 MG/ML IJ SOLN
12.5000 mg | Freq: Four times a day (QID) | INTRAMUSCULAR | Status: DC | PRN
  Administered 2012-11-25 (×2): 12.5 mg via INTRAVENOUS
  Filled 2012-11-22 (×2): qty 1

## 2012-11-22 MED ORDER — KETOROLAC TROMETHAMINE 30 MG/ML IJ SOLN
30.0000 mg | Freq: Once | INTRAMUSCULAR | Status: DC
  Administered 2012-11-22: 30 mg via INTRAVENOUS
  Filled 2012-11-22: qty 1

## 2012-11-22 NOTE — ED Provider Notes (Signed)
History     CSN: [id number]  Arrival date & time 11/22/12  1638   First MD Initiated Contact with Patient 11/22/12 1734      Chief Complaint  Patient presents with  . Sickle Cell Pain Crisis  . Chest Pain    (Consider location/radiation/quality/duration/timing/severity/associated sxs/prior treatment) Patient is a 27 y.o. male presenting with sickle cell pain and chest pain. The history is provided by the patient.  Sickle Cell Pain Crisis  This is a recurrent problem. Associated symptoms include chest pain and back pain. Pertinent negatives include no abdominal pain, no diarrhea, no nausea, no vomiting, no headaches, no weakness, no rash and no eye pain.  Chest Pain Associated symptoms: back pain   Associated symptoms: no abdominal pain, no fever, no headache, no nausea, no numbness, no shortness of breath, not vomiting and no weakness    patient presents with sickle cell pain and left-sided chest pain. No cough. No shortness of breath. The pain is constant has been unrelieved by his home medications. He has hemoglobin SS. His baseline hemoglobin will run at 7 or 8. His primary care doctor was Dr. [name].   Past Medical History  Diagnosis Date  . Sickle cell disease   . Avascular necrosis of femur head, right   . H/O allergic rhinitis   . H/O hypokalemia   . Chronic pain syndrome   . H/O wheezing     with colds    Past Surgical History  Procedure Laterality Date  . Cholecystectomy  1995  . Left shoulder arthroplasty  04/09/2008  . Left pac placement  07/04/2012  . Exchange transfusion  02/2008    perioperatively for shoulder surgery    Family History  Problem Relation Age of Onset  . Sickle cell anemia Brother   . Sickle cell trait Father   . Sickle cell trait Mother   . Diabetes Mellitus II Mother   . Sickle cell anemia Paternal Uncle   . Asthma Neg Hx   . Allergic rhinitis Neg Hx     History  Substance Use Topics  . Smoking status: Never Smoker   . Smokeless  tobacco: Never Used  . Alcohol Use: No      Review of Systems  Constitutional: Negative for fever, activity change and appetite change.  HENT: Negative for neck stiffness.   Eyes: Negative for pain.  Respiratory: Negative for chest tightness and shortness of breath.   Cardiovascular: Positive for chest pain. Negative for leg swelling.  Gastrointestinal: Negative for nausea, vomiting, abdominal pain and diarrhea.  Genitourinary: Negative for flank pain.  Musculoskeletal: Positive for back pain.  Skin: Negative for rash.  Neurological: Negative for weakness, numbness and headaches.  Psychiatric/Behavioral: Negative for behavioral problems.    Allergies  Review of patient's allergies indicates no known allergies.  Home Medications   No current outpatient prescriptions on file.  BP 98/56  Pulse 76  Temp(Src) 98.6 F (37 C) (Oral)  Resp 16  Wt 184 lb 11.9 oz (83.8 kg)  BMI 27.27 kg/m2  SpO2 100%  Physical Exam  Nursing note and vitals reviewed. Constitutional: He is oriented to person, place, and time. He appears well-developed and well-nourished.  Patient appears uncomfortable  HENT:  Head: Normocephalic and atraumatic.  Eyes: EOM are normal. Pupils are equal, round, and reactive to light.  Neck: Normal range of motion. Neck supple.  Cardiovascular: Normal rate, regular rhythm and normal heart sounds.   No murmur heard. Pulmonary/Chest: Effort normal and breath sounds normal. He  exhibits no tenderness.  Abdominal: Soft. Bowel sounds are normal. He exhibits no distension and no mass. There is no tenderness. There is no rebound and no guarding.  Musculoskeletal: Normal range of motion. He exhibits no edema.  Neurological: He is alert and oriented to person, place, and time. No cranial nerve deficit.  Skin: Skin is warm and dry.  Psychiatric: He has a normal mood and affect.    ED Course  Procedures (including critical care time)  Labs Reviewed  CBC WITH  DIFFERENTIAL - Abnormal; Notable for the following:    RBC 2.28 (*)    Hemoglobin 7.7 (*)    HCT 22.2 (*)    RDW 18.5 (*)    All other components within normal limits  COMPREHENSIVE METABOLIC PANEL - Abnormal; Notable for the following:    Potassium 3.4 (*)    Total Bilirubin 1.6 (*)    All other components within normal limits  RETICULOCYTES - Abnormal; Notable for the following:    Retic Ct Pct 13.7 (*)    RBC. 2.28 (*)    Retic Count, Manual 312.4 (*)    All other components within normal limits  CBC WITH DIFFERENTIAL - Abnormal; Notable for the following:    RBC 2.37 (*)    Hemoglobin 8.0 (*)    HCT 22.7 (*)    RDW 17.1 (*)    All other components within normal limits  BASIC METABOLIC PANEL - Abnormal; Notable for the following:    Glucose, Bld 127 (*)    All other components within normal limits   Dg Chest 2 View  11/22/2012  *RADIOLOGY REPORT*  Clinical Data: Chest pain, sickle cell disease  CHEST - 2 VIEW  Comparison: 11/10/2012  Findings: Left humeral replacement noted.  Left sided Port-A-Cath in place with tip over the high right atrium.  Heart size is moderately enlarged.  Chronically prominent interstitial lung lesions are noted bilaterally without focal pulmonary opacity. Support apparatus obscure detail over the left lung apex.  No pleural effusion.  IMPRESSION: Stable findings of low lung volumes with crowding of the chronically prominent bronchovascular markings but no new focal pulmonary opacity.   Original Report Authenticated By: Christiana Pellant, M.D.      1. Sickle cell pain crisis   2. Sickle cell anemia   3. Backache   4. Chronic pain      Date: 11/22/2012  Rate: 91  Rhythm: normal sinus rhythm  QRS Axis: normal  Intervals: normal  ST/T Wave abnormalities: nonspecific T wave changes  Conduction Disutrbances:none  Narrative Interpretation:   Old EKG Reviewed: unchanged    MDM  Patient presents with chest pain and sickle cell disease. EKG is  reassuring. X-ray does not show pneumonia. Patient's pain has been unrelieved with 8 mg of IV Dilaudid. He'll be admitted to medicine for further evaluation and treatment.        Juliet Rude. Rubin Payor, MD 11/23/12 2200

## 2012-11-22 NOTE — ED Notes (Signed)
Call to [phone] to give report

## 2012-11-22 NOTE — ED Notes (Signed)
Patient transported to X-ray 

## 2012-11-22 NOTE — ED Notes (Signed)
Paged IV team to come access pts port

## 2012-11-22 NOTE — ED Notes (Signed)
Pt presents to department for evaluation of sickle cell exacerbation. States pain to chest, lower back and L leg. Ongoing x3 days, pain became worse today. Also states shortness of breath and fatigue. Speaking complete sentences. Respirations unlabored. He is alert and oriented x4. 10/10 pain at the time.

## 2012-11-22 NOTE — ED Notes (Signed)
IV team is here to access port.

## 2012-11-22 NOTE — H&P (Signed)
Dennis Bernard is an 27 y.o. male.   Chief Complaint:  Back and chest pain  HPI: Dennis Bernard is a 27 yo AAM with a PMHx of Sickle cell disease and chronic opoid dependence/abuse who presents to [facility] ED with c/o sickle cell pain-chest and back. He is usually followed at [facility] as well as [facility] sickle cell clinic with Dr. Ashley Bernard and was a pt of Dr. August Bernard in the past. Pt states his goal pain level at home is a 2/10. He takes MS Contin and MS IR prn daily and says he has continued to do so up until ED presentation. His back pain began about 3 days ago, but has worsened to the point of being uncontrolled even with home meds. His chest pain started today. Some SOB when lying flat, but no DOE, wheezing or air hunger. He states his pain level now is a 8/10.  He denies any flu type symptoms, n/v/d, abd pain, weakness, dysuria and also see ROS.  Therefore, because of his unrelenting pain with sickle cell anemia, [facility] asked to admit.   Past Medical History  Diagnosis Date  . Sickle cell disease   . Avascular necrosis of femur head, right   . H/O allergic rhinitis   . H/O hypokalemia   . Chronic pain syndrome   . H/O wheezing     with colds    Past Surgical History  Procedure Laterality Date  . Cholecystectomy  1995  . Left shoulder arthroplasty  04/09/2008  . Left pac placement  07/04/2012  . Exchange transfusion  02/2008    perioperatively for shoulder surgery    Family History  Problem Relation Age of Onset  . Sickle cell anemia Brother   . Sickle cell trait Father   . Sickle cell trait Mother   . Diabetes Mellitus II Mother   . Sickle cell anemia Paternal Uncle   . Asthma Neg Hx   . Allergic rhinitis Neg Hx    Social History:  reports that he has never smoked. He has never used smokeless tobacco. He reports that he does not drink alcohol or use illicit drugs.  Allergies: No Known Allergies    Results for orders placed during the hospital encounter of 11/22/12 (from the past  48 hour(s))  CBC WITH DIFFERENTIAL     Status: Abnormal   Collection Time    11/22/12  6:30 PM      Result Value Range   WBC 8.8  4.0 - 10.5 K/uL   Comment: ADJUSTED FOR NUCLEATED RBC'S     WHITE COUNT CONFIRMED ON SMEAR   RBC 2.28 (*) 4.22 - 5.81 MIL/uL   Hemoglobin 7.7 (*) 13.0 - 17.0 g/dL   HCT 96.0 (*) 45.4 - 09.8 %   MCV 97.4  78.0 - 100.0 fL   MCH 33.8  26.0 - 34.0 pg   MCHC 34.7  30.0 - 36.0 g/dL   RDW 11.9 (*) 14.7 - 82.9 %   Platelets 312  150 - 400 K/uL   Neutrophils Relative 61  43 - 77 %   Lymphocytes Relative 29  12 - 46 %   Monocytes Relative 10  3 - 12 %   Eosinophils Relative 0  0 - 5 %   Basophils Relative 0  0 - 1 %   Neutro Abs 5.3  1.7 - 7.7 K/uL   Lymphs Abs 2.6  0.7 - 4.0 K/uL   Monocytes Absolute 0.9  0.1 - 1.0 K/uL   Eosinophils Absolute 0.0  0.0 - 0.7 K/uL   Basophils Absolute 0.0  0.0 - 0.1 K/uL   RBC Morphology POLYCHROMASIA PRESENT     Comment: HOWELL/JOLLY BODIES     SICKLE CELLS     PAPPENHEIMER BODIES     TARGET CELLS     NUCLEATED RED BLOOD CELLS PRESENT (20 PER 100 WBC)  COMPREHENSIVE METABOLIC PANEL     Status: Abnormal   Collection Time    11/22/12  6:30 PM      Result Value Range   Sodium 141  135 - 145 mEq/L   Potassium 3.4 (*) 3.5 - 5.1 mEq/L   Chloride 107  96 - 112 mEq/L   CO2 23  19 - 32 mEq/L   Glucose, Bld 92  70 - 99 mg/dL   BUN 8  6 - 23 mg/dL   Creatinine, Ser 8.41  0.50 - 1.35 mg/dL   Calcium 9.1  8.4 - 32.4 mg/dL   Total Protein 7.0  6.0 - 8.3 g/dL   Albumin 4.0  3.5 - 5.2 g/dL   AST 27  0 - 37 U/L   ALT 26  0 - 53 U/L   Alkaline Phosphatase 97  39 - 117 U/L   Total Bilirubin 1.6 (*) 0.3 - 1.2 mg/dL   GFR calc non Af Amer >90  >90 mL/min   GFR calc Af Amer >90  >90 mL/min   Comment:            The eGFR has been calculated     using the CKD EPI equation.     This calculation has not been     validated in all clinical     situations.     eGFR's persistently     <90 mL/min signify     possible Chronic Kidney  Disease.  RETICULOCYTES     Status: Abnormal   Collection Time    11/22/12  6:30 PM      Result Value Range   Retic Ct Pct 13.7 (*) 0.4 - 3.1 %   RBC. 2.28 (*) 4.22 - 5.81 MIL/uL   Retic Count, Manual 312.4 (*) 19.0 - 186.0 K/uL   Dg Chest 2 View  11/22/2012  *RADIOLOGY REPORT*  Clinical Data: Chest pain, sickle cell disease  CHEST - 2 VIEW  Comparison: 11/10/2012  Findings: Left humeral replacement noted.  Left sided Port-A-Cath in place with tip over the high right atrium.  Heart size is moderately enlarged.  Chronically prominent interstitial lung lesions are noted bilaterally without focal pulmonary opacity. Support apparatus obscure detail over the left lung apex.  No pleural effusion.  IMPRESSION: Stable findings of low lung volumes with crowding of the chronically prominent bronchovascular markings but no new focal pulmonary opacity.   Original Report Authenticated By: Christiana Pellant, M.D.     Review of Systems  Constitutional: Negative for fever, chills, weight loss, malaise/fatigue and diaphoresis.  HENT: Negative for hearing loss, ear pain, congestion, sore throat and ear discharge.   Eyes: Negative for blurred vision, double vision, pain and discharge.  Respiratory: Positive for shortness of breath. Negative for cough, sputum production and wheezing.        "slight" when lying down for last 2 days  Cardiovascular: Positive for chest pain. Negative for palpitations, orthopnea and PND.       No radiation of pain to jaw or arm.   Gastrointestinal: Positive for nausea. Negative for vomiting, abdominal pain and diarrhea.  Genitourinary: Negative for dysuria.  Musculoskeletal: Positive for back  pain. Negative for myalgias.  Skin: Positive for itching. Negative for rash.  Neurological: Negative.  Negative for dizziness, tingling, sensory change, speech change, focal weakness, weakness and headaches.  Endo/Heme/Allergies: Does not bruise/bleed easily.  Psychiatric/Behavioral: Negative  for depression. The patient is not nervous/anxious.     Blood pressure 112/69, pulse 74, temperature 98.9 F (37.2 C), temperature source Oral, resp. rate 17, SpO2 99.00%. Physical Exam  Constitutional: He is oriented to person, place, and time. He appears well-developed and well-nourished. No distress.  Appears well. NAD  HENT:  Head: Normocephalic and atraumatic.  Mouth/Throat: Oropharynx is clear and moist. No oropharyngeal exudate.  Eyes: Right eye exhibits no discharge. Left eye exhibits no discharge. No scleral icterus.  Neck: Normal range of motion. Neck supple. No thyromegaly present.  Cardiovascular: Normal rate and regular rhythm.  Exam reveals no gallop and no friction rub.   No murmur heard. Respiratory: Effort normal and breath sounds normal. No respiratory distress. He has no wheezes. He exhibits tenderness (mild with palpation throughout chest).  GI: Soft. Bowel sounds are normal. He exhibits no distension. There is no tenderness. There is no rebound and no guarding.  Genitourinary:  deferred  Musculoskeletal: Normal range of motion. He exhibits no edema.  Lymphadenopathy:    He has no cervical adenopathy.  Neurological: He is alert and oriented to person, place, and time. No cranial nerve deficit (no focal deficit).  Skin: Skin is warm and dry. No rash noted. He is not diaphoretic. No erythema. No pallor.  Psychiatric: He has a normal mood and affect.    Assessment/Plan 1. Sickle cell anemia with pain-this is a recurrent issue. His labs or tests do not reflect crisis. His Hgb is stable. Retic count elevated. No leukocytosis or signs of acute chest syndrome. CXR same as before. EKG without acute changes.   I wanted the pt to go to the OBS sickle cell unit at [facility], but he is on the "list" of pts not to be admitted there. So, will admit here to [facility] as an OBS pt as I suspect he should be ready for d/c tomorrow. Was just at [facility] admitted 2/27-3/3. Pt with chronic dependence on  opoids per hx. Will cont his home meds of MS Contin and MS IR prn. Add Toradol. Dilaudid PCA per pt request. IV hydration. Benadryl and Phenergan prn. Up ad lib. Recheck CBC in am. He should f/up with his PCP after d/c.  2. Hypokalemia-minimal. Recheck in am.   Note to be cosigned by Dr. Jerre Simon, KAREN 11/22/2012, 10:08 PM  Crista Curb, M.D.

## 2012-11-22 NOTE — ED Notes (Signed)
EKG was done in triage prior to coming back

## 2012-11-23 DIAGNOSIS — D5700 Hb-SS disease with crisis, unspecified: Secondary | ICD-10-CM

## 2012-11-23 LAB — CBC WITH DIFFERENTIAL/PLATELET
Basophils Absolute: 0 10*3/uL (ref 0.0–0.1)
Basophils Relative: 0 % (ref 0–1)
Eosinophils Absolute: 0.1 10*3/uL (ref 0.0–0.7)
Eosinophils Relative: 1 % (ref 0–5)
Hemoglobin: 8 g/dL — ABNORMAL LOW (ref 13.0–17.0)
MCH: 33.8 pg (ref 26.0–34.0)
MCHC: 35.2 g/dL (ref 30.0–36.0)
MCV: 95.8 fL (ref 78.0–100.0)
Metamyelocytes Relative: 0 %
Myelocytes: 0 %
Platelets: 293 10*3/uL (ref 150–400)
RBC: 2.37 MIL/uL — ABNORMAL LOW (ref 4.22–5.81)

## 2012-11-23 LAB — BASIC METABOLIC PANEL
GFR calc Af Amer: >90 mL/min (ref >90)
GFR calc non Af Amer: >90 mL/min (ref >90)
Potassium: 3.6 mEq/L (ref 3.5–5.1)
Sodium: 138 mEq/L (ref 135–145)

## 2012-11-23 MED ORDER — ALTEPLASE 2 MG IJ SOLR
2.0000 mg | Freq: Once | INTRAMUSCULAR | Status: AC
  Administered 2012-11-23: 2 mg
  Filled 2012-11-23: qty 2

## 2012-11-23 MED ORDER — HYDROMORPHONE 0.3 MG/ML IV SOLN
INTRAVENOUS | Status: DC
  Administered 2012-11-23: 0.5 mg via INTRAVENOUS
  Administered 2012-11-23: 09:00:00 via INTRAVENOUS
  Filled 2012-11-23: qty 25

## 2012-11-23 MED ORDER — DIPHENHYDRAMINE HCL 12.5 MG/5ML PO ELIX
12.5000 mg | ORAL_SOLUTION | Freq: Four times a day (QID) | ORAL | Status: DC | PRN
  Filled 2012-11-23: qty 5

## 2012-11-23 MED ORDER — SODIUM CHLORIDE 0.9 % IJ SOLN: 9.0000 mL | INTRAMUSCULAR | Status: DC | PRN

## 2012-11-23 MED ORDER — DIPHENHYDRAMINE HCL 50 MG/ML IJ SOLN
12.5000 mg | Freq: Four times a day (QID) | INTRAMUSCULAR | Status: DC | PRN
  Administered 2012-11-23 – 2012-11-26 (×9): 12.5 mg via INTRAVENOUS
  Filled 2012-11-23 (×8): qty 1

## 2012-11-23 MED ORDER — ENOXAPARIN SODIUM 40 MG/0.4ML ~~LOC~~ SOLN
40.0000 mg | SUBCUTANEOUS | Status: DC
  Administered 2012-11-23 – 2012-11-26 (×4): 40 mg via SUBCUTANEOUS
  Filled 2012-11-23 (×5): qty 0.4

## 2012-11-23 MED ORDER — HYDROMORPHONE 0.3 MG/ML IV SOLN
INTRAVENOUS | Status: DC
  Administered 2012-11-23 – 2012-11-24 (×5): via INTRAVENOUS
  Administered 2012-11-24: 7.5 mg via INTRAVENOUS
  Administered 2012-11-24: 01:00:00 via INTRAVENOUS
  Administered 2012-11-24: 15.12 mg via INTRAVENOUS
  Administered 2012-11-24 (×2): via INTRAVENOUS
  Administered 2012-11-24: 7.4 mg via INTRAVENOUS
  Filled 2012-11-23 (×8): qty 25

## 2012-11-23 MED ORDER — NALOXONE HCL 0.4 MG/ML IJ SOLN: 0.4000 mg | INTRAMUSCULAR | Status: DC | PRN

## 2012-11-23 MED ORDER — SODIUM CHLORIDE 0.9 % IJ SOLN
10.0000 mL | INTRAMUSCULAR | Status: DC | PRN
  Administered 2012-11-23: 60 mL
  Administered 2012-11-23 – 2012-11-24 (×3): 10 mL

## 2012-11-23 MED ORDER — HYDROMORPHONE 0.3 MG/ML IV SOLN
INTRAVENOUS | Status: DC
  Administered 2012-11-23: 0.5 mg via INTRAVENOUS
  Administered 2012-11-23: 12:00:00 via INTRAVENOUS

## 2012-11-23 MED ORDER — ONDANSETRON HCL 4 MG/2ML IJ SOLN: 4.0000 mg | Freq: Four times a day (QID) | INTRAMUSCULAR | Status: DC | PRN

## 2012-11-23 NOTE — Progress Notes (Signed)
Pt transferred from ED. Pt comes from home with family. Pt is AOX4, Independent. Pt has no skin issues. Pt oriented to unit, policies, and safety. Call bell light within reach. Pt told to call prior to getting oob for safety.

## 2012-11-23 NOTE — Progress Notes (Signed)
Patient with hx of sickle cell admitted due to acute on chronic back and legs pain (typical of acute crisis); patient also reports he ran out out of his pain meds and is currently w/o PCP (formerly Dr. Diamantina Providence patient). No signs of PNA, acute chest syndrome or any other acute infection.  Plan: -Adjust pain meds -continue IVF's -follow admitting labs. -For further details on A/P and admission hx, referred to H&PI per Dr. Lendell Caprice.  MADERA,CARLOS [phone]

## 2012-11-23 NOTE — Progress Notes (Signed)
Utilization review completed.  

## 2012-11-24 DIAGNOSIS — M545 Low back pain: Secondary | ICD-10-CM

## 2012-11-24 DIAGNOSIS — E876 Hypokalemia: Secondary | ICD-10-CM

## 2012-11-24 DIAGNOSIS — D5700 Hb-SS disease with crisis, unspecified: Principal | ICD-10-CM

## 2012-11-24 DIAGNOSIS — G8929 Other chronic pain: Secondary | ICD-10-CM

## 2012-11-24 MED ORDER — HYDROMORPHONE 0.3 MG/ML IV SOLN
INTRAVENOUS | Status: DC
  Administered 2012-11-24: 14.92 mg via INTRAVENOUS
  Administered 2012-11-25: 08:00:00 via INTRAVENOUS
  Administered 2012-11-25: 7.47 mg via INTRAVENOUS
  Filled 2012-11-24 (×4): qty 25

## 2012-11-24 MED ORDER — MORPHINE SULFATE ER 30 MG PO TBCR
60.0000 mg | EXTENDED_RELEASE_TABLET | Freq: Three times a day (TID) | ORAL | Status: DC
  Administered 2012-11-24 – 2012-11-25 (×3): 60 mg via ORAL
  Filled 2012-11-24 (×3): qty 2

## 2012-11-24 NOTE — Progress Notes (Signed)
[facility] PROGRESS NOTE  Dennis Bernard [id number]:[id number] DOB: Sep 03, 1986 DOA: 11/22/2012 PCP: Dennis Saucer [name], MD  Assessment/Plan: 1-sickle cell anemia with acute pain crisis: No signs of acute chest syndrome. -Pain is now improving with the use of PCA in combination of MS contin and morphine IR. -Will adjust extended release morphine and start weaning him of PCA pump -PRN toradol to assist with pain control -Continue hydroxyurea and folic acid  2-hypokalemia: repleted  3-Chronic pain: treatment as mentioned above; will need pain clinic assessment and close outpatient followup.  4-DVT: lovenox   Code Status: Full Family Communication: no family at bedside Disposition Plan: Home with medical stable (most likely 1 to 2 days)   Consultants:  None  Procedures:  None  Antibiotics: None  HPI/Subjective: Afebrile, slightly improved, the pain is subsiding.  Objective: Filed Vitals:   11/24/12 1254 11/24/12 1333 11/24/12 1647 11/24/12 1652  BP:  107/65  112/67  Pulse:  73  68  Temp:  98.3 F (36.8 C)  98.4 F (36.9 C)  TempSrc:      Resp: 14 20 13 16   Weight:      SpO2: 100% 100% 99% 100%    Intake/Output Summary (Last 24 hours) at 11/24/12 1828 Last data filed at 11/24/12 1456  Gross per 24 hour  Intake 1235.57 ml  Output   1100 ml  Net 135.57 ml   Filed Weights   11/22/12 2341 11/24/12 0507  Weight: 83.8 kg (184 lb 11.9 oz) 85.1 kg (187 lb 9.8 oz)    Exam:   General:  Afebrile, feeling better, no acute distress.  Cardiovascular: S1 and S2, no rubs, no gallops, no murmurs.  Respiratory: Good air movement, no wheezing, no crackles  Abdomen: Soft, nontender, nondistended, positive bowel sounds  Musculoskeletal: No erythema, edema or cyanosis of his lower extremities; there is no joint swelling either a patient on exam. Patient complaining of significant back pain and lower extremity pain (according to him typical for his sickle cell crisis  pain)  Neurology: No focal deficit  Data Reviewed: Basic Metabolic Panel:  Recent Labs Lab 11/22/12 1830 11/23/12 1051  NA 141 138  K 3.4* 3.6  CL 107 106  CO2 23 25  GLUCOSE 92 127*  BUN 8 6  CREATININE 0.56 0.54  CALCIUM 9.1 8.7   Liver Function Tests:  Recent Labs Lab 11/22/12 1830  AST 27  ALT 26  ALKPHOS 97  BILITOT 1.6*  PROT 7.0  ALBUMIN 4.0   CBC:  Recent Labs Lab 11/22/12 1830 11/23/12 1051  WBC 8.8 8.9  NEUTROABS 5.3 6.9  HGB 7.7* 8.0*  HCT 22.2* 22.7*  MCV 97.4 95.8  PLT 312 293     Studies: No results found.  Scheduled Meds: . enoxaparin (LOVENOX) injection  40 mg Subcutaneous Q24H  . folic acid  1 mg Oral Daily  . [START ON 11/25/2012] HYDROmorphone PCA 0.3 mg/mL   Intravenous Q4H  . hydroxyurea  2,000 mg Oral Q breakfast  . ketorolac  30 mg Intravenous Q8H  . morphine  60 mg Oral Q8H   Continuous Infusions: . dextrose 5 % and 0.45% NaCl 1,000 mL (11/24/12 1548)    Time spent: >47minutes   Dennis Bernard  [facility] Pager [phone]. If 7PM-7AM, please contact night-coverage at [email or web address], password [facility] 11/24/2012, 6:28 PM  LOS: 2 days

## 2012-11-25 DIAGNOSIS — R112 Nausea with vomiting, unspecified: Secondary | ICD-10-CM

## 2012-11-25 MED ORDER — SODIUM CHLORIDE 0.9 % IJ SOLN: 9.0000 mL | INTRAMUSCULAR | Status: DC | PRN

## 2012-11-25 MED ORDER — ONDANSETRON HCL 4 MG/2ML IJ SOLN
4.0000 mg | Freq: Four times a day (QID) | INTRAMUSCULAR | Status: DC | PRN
  Administered 2012-11-25 – 2012-11-27 (×7): 4 mg via INTRAVENOUS
  Filled 2012-11-25 (×6): qty 2

## 2012-11-25 MED ORDER — HYDROMORPHONE 0.3 MG/ML IV SOLN
INTRAVENOUS | Status: AC
  Filled 2012-11-25: qty 25

## 2012-11-25 MED ORDER — DIPHENHYDRAMINE HCL 12.5 MG/5ML PO ELIX
12.5000 mg | ORAL_SOLUTION | Freq: Four times a day (QID) | ORAL | Status: DC | PRN
  Filled 2012-11-25: qty 5

## 2012-11-25 MED ORDER — PROMETHAZINE HCL 25 MG/ML IJ SOLN: 25.0000 mg | Freq: Four times a day (QID) | INTRAMUSCULAR | Status: DC | PRN

## 2012-11-25 MED ORDER — DIPHENHYDRAMINE HCL 50 MG/ML IJ SOLN
12.5000 mg | Freq: Four times a day (QID) | INTRAMUSCULAR | Status: DC | PRN
  Administered 2012-11-26 – 2012-11-27 (×5): 12.5 mg via INTRAVENOUS
  Filled 2012-11-25 (×6): qty 1

## 2012-11-25 MED ORDER — HYDROMORPHONE 0.3 MG/ML IV SOLN
INTRAVENOUS | Status: DC
  Administered 2012-11-25: 19:00:00 via INTRAVENOUS
  Administered 2012-11-25: 2.16 mg via INTRAVENOUS
  Administered 2012-11-26: 13:00:00 via INTRAVENOUS
  Administered 2012-11-26: 13.89 mg via INTRAVENOUS
  Administered 2012-11-26: 08:00:00 via INTRAVENOUS
  Administered 2012-11-26: 7.6 mg via INTRAVENOUS
  Filled 2012-11-25 (×4): qty 25

## 2012-11-25 MED ORDER — METOCLOPRAMIDE HCL 5 MG PO TABS
5.0000 mg | ORAL_TABLET | Freq: Three times a day (TID) | ORAL | Status: DC
  Administered 2012-11-25 – 2012-11-27 (×6): 5 mg via ORAL
  Filled 2012-11-25 (×8): qty 1

## 2012-11-25 MED ORDER — NALOXONE HCL 0.4 MG/ML IJ SOLN: 0.4000 mg | INTRAMUSCULAR | Status: DC | PRN

## 2012-11-25 MED ORDER — PANTOPRAZOLE SODIUM 40 MG PO TBEC
40.0000 mg | DELAYED_RELEASE_TABLET | Freq: Every day | ORAL | Status: DC
  Administered 2012-11-25 – 2012-11-27 (×3): 40 mg via ORAL
  Filled 2012-11-25 (×2): qty 1

## 2012-11-25 NOTE — Progress Notes (Signed)
[facility] PROGRESS NOTE  Abel Hageman [id number] DOB: [birth year] DOA: 11/22/2012 PCP: [name] [name], MD  Assessment/Plan: 1-sickle cell anemia with acute pain crisis: No signs of acute chest syndrome. -Pain is improving -Will follow pain w/o PCA dilaudid; continue new dose for extended release morphine and use PRN IR for breakthrough. -PRN toradol to assist with pain control -Continue hydroxyurea and folic acid  2-hypokalemia: repleted  3-Chronic pain: treatment as mentioned above; will need pain clinic assessment and close outpatient followup.  4-Nausea/vomiting: most likely associated to side effects from narcotics; other concerns will be gastroenteritis.  -will wean him off dialudid PCA pump -start schedule reglan -if needed will make him NPO. -PPI  5-DVT: lovenox   Code Status: Full Family Communication: no family at bedside Disposition Plan: Home with medical stable (most likely 1 to 2 days)   Consultants:  None  Procedures:  None  Antibiotics: None  HPI/Subjective: Afebrile; reports pain better, but still present. Complaining of nausea and with some episodes of vomiting.  Objective: Filed Vitals:   11/25/12 0815 11/25/12 1200 11/25/12 1557 11/25/12 1605  BP:    109/72  Pulse:    90  Temp:    99.2 F (37.3 C)  TempSrc:    Oral  Resp: 16 16 16 18   Weight:      SpO2: 100% 100% 96% 100%    Intake/Output Summary (Last 24 hours) at 11/25/12 1617 Last data filed at 11/25/12 1500  Gross per 24 hour  Intake   1940 ml  Output   2500 ml  Net   -560 ml   Filed Weights   11/22/12 2341 11/24/12 0507 11/24/12 2201  Weight: 83.8 kg (184 lb 11.9 oz) 85.1 kg (187 lb 9.8 oz) 85.7 kg (188 lb 15 oz)    Exam:   General:  Afebrile, feeling better, no acute distress. Complaining of nausea and vomiting.  Cardiovascular: S1 and S2, no rubs, no gallops, no murmurs.  Respiratory: Good air movement, no wheezing, no crackles  Abdomen: Soft,  nontender, nondistended, positive bowel sounds Musculoskeletal: No erythema, edema or cyanosis of his lower extremities; there is no joint swelling or erythema  on exam. Patient still complaining of back pain and lower extremity pain (according to him typical for his sickle cell crisis pain), pain improved overall.  Neurology: No focal deficit  Data Reviewed: Basic Metabolic Panel:  Recent Labs Lab 11/22/12 1830 11/23/12 1051  NA 141 138  K 3.4* 3.6  CL 107 106  CO2 23 25  GLUCOSE 92 127*  BUN 8 6  CREATININE 0.56 0.54  CALCIUM 9.1 8.7   Liver Function Tests:  Recent Labs Lab 11/22/12 1830  AST 27  ALT 26  ALKPHOS 97  BILITOT 1.6*  PROT 7.0  ALBUMIN 4.0   CBC:  Recent Labs Lab 11/22/12 1830 11/23/12 1051  WBC 8.8 8.9  NEUTROABS 5.3 6.9  HGB 7.7* 8.0*  HCT 22.2* 22.7*  MCV 97.4 95.8  PLT 312 293     Studies: No results found.  Scheduled Meds: . enoxaparin (LOVENOX) injection  40 mg Subcutaneous Q24H  . folic acid  1 mg Oral Daily  . hydroxyurea  2,000 mg Oral Q breakfast  . metoCLOPramide  5 mg Oral TID AC  . morphine  60 mg Oral Q8H   Continuous Infusions: . dextrose 5 % and 0.45% NaCl 100 mL/hr at 11/25/12 0551    Time spent: >32minutes   MADERA,CARLOS  [facility] Pager [phone]. If 7PM-7AM, please contact night-coverage  at [email or web address], password [facility] 11/25/2012, 4:17 PM  LOS: 3 days

## 2012-11-25 NOTE — Progress Notes (Signed)
Patient c/o pain and nausea and vomiting all po meds.  Dr Gwenlyn Perking aware - orders received.

## 2012-11-26 DIAGNOSIS — M549 Dorsalgia, unspecified: Secondary | ICD-10-CM

## 2012-11-26 LAB — BASIC METABOLIC PANEL
CO2: 27 mEq/L (ref 19–32)
Chloride: 102 mEq/L (ref 96–112)
GFR calc Af Amer: >90 mL/min (ref >90)
Potassium: 2.8 mEq/L — ABNORMAL LOW (ref 3.5–5.1)

## 2012-11-26 LAB — CBC
HCT: 24.7 % — ABNORMAL LOW (ref 39.0–52.0)
Hemoglobin: 8.7 g/dL — ABNORMAL LOW (ref 13.0–17.0)
MCV: 95.4 fL (ref 78.0–100.0)
WBC: 8.1 10*3/uL (ref 4.0–10.5)

## 2012-11-26 MED ORDER — SODIUM CHLORIDE 0.9 % IV SOLN
INTRAVENOUS | Status: DC
  Administered 2012-11-26: 15:00:00 via INTRAVENOUS

## 2012-11-26 MED ORDER — MORPHINE SULFATE ER 30 MG PO TBCR
60.0000 mg | EXTENDED_RELEASE_TABLET | Freq: Three times a day (TID) | ORAL | Status: DC
  Administered 2012-11-26 – 2012-11-27 (×3): 60 mg via ORAL
  Filled 2012-11-26 (×3): qty 2

## 2012-11-26 MED ORDER — POTASSIUM CHLORIDE CRYS ER 20 MEQ PO TBCR
40.0000 meq | EXTENDED_RELEASE_TABLET | ORAL | Status: AC
  Administered 2012-11-26 (×3): 40 meq via ORAL
  Filled 2012-11-26 (×3): qty 2

## 2012-11-26 MED ORDER — MORPHINE SULFATE 15 MG PO TABS
15.0000 mg | ORAL_TABLET | ORAL | Status: DC | PRN
  Administered 2012-11-26 – 2012-11-27 (×4): 15 mg via ORAL
  Filled 2012-11-26 (×4): qty 1

## 2012-11-26 NOTE — Progress Notes (Signed)
[facility] PROGRESS NOTE  Dennis Bernard [id number]:[id number] DOB: [birth year] DOA: 11/22/2012 PCP: [name] [name], MD  Assessment/Plan: 1-sickle cell anemia with acute pain crisis: No signs of acute chest syndrome. -Pain is slowly but steadily improving -Will follow pain w/o PCA dilaudid; continue new dose for extended release morphine and use PRN IR for breakthrough. -PRN toradol to assist with pain control -Continue hydroxyurea and folic acid Patient ask to get OOB  2-hypokalemia: K 2.8; most likely with vomiting. Replete and check Mg.  3-Chronic pain: treatment as mentioned above; will need pain clinic assessment and close outpatient followup.  4-Nausea/vomiting: most likely associated to side effects from narcotics; other concerns will be gastroenteritis.  -better. -d/c dilaudid -cont reglan and PPI -PRN zofran and phenergan  5-DVT: lovenox    Code Status: Full Family Communication: no family at bedside Disposition Plan: Home with medical stable (most likely 1 to 2 days)   Consultants:  None  Procedures:  None  Antibiotics: None  HPI/Subjective: Afebrile; reports pain better, but still present. No further vomiting. Keeping meds and diet down.  Objective: Filed Vitals:   11/26/12 1145 11/26/12 1335 11/26/12 1800 11/26/12 2020  BP:  101/55 114/57 95/52  Pulse:  96 80 72  Temp:  98.7 F (37.1 C) 99 F (37.2 C) 98.7 F (37.1 C)  TempSrc:  Oral Oral Oral  Resp: 14 20 20 18   Height:      Weight:    83 kg (182 lb 15.7 oz)  SpO2: 99% 100% 100% 100%    Intake/Output Summary (Last 24 hours) at 11/26/12 2218 Last data filed at 11/26/12 2023  Gross per 24 hour  Intake 2404.13 ml  Output   1970 ml  Net 434.13 ml   Filed Weights   11/24/12 2201 11/25/12 2105 11/26/12 2020  Weight: 85.7 kg (188 lb 15 oz) 85.5 kg (188 lb 7.9 oz) 83 kg (182 lb 15.7 oz)    Exam:   General:  Afebrile, feeling better, no acute distress. Complaining of nausea and  vomiting.  Cardiovascular: S1 and S2, no rubs, no gallops, no murmurs.  Respiratory: Good air movement, no wheezing, no crackles  Abdomen: Soft, nontender, nondistended, positive bowel sounds Musculoskeletal: No erythema, edema or cyanosis of his lower extremities; there is no joint swelling or erythema  on exam. Patient still complaining of back pain and lower extremity pain (according to him typical for his sickle cell crisis pain), pain improved overall.  Neurology: No focal deficit  Data Reviewed: Basic Metabolic Panel:  Recent Labs Lab 11/22/12 1830 11/23/12 1051 11/26/12 0500  NA 141 138 138  K 3.4* 3.6 2.8*  CL 107 106 102  CO2 23 25 27   GLUCOSE 92 127* 98  BUN 8 6 7   CREATININE 0.56 0.54 0.57  CALCIUM 9.1 8.7 9.0   Liver Function Tests:  Recent Labs Lab 11/22/12 1830  AST 27  ALT 26  ALKPHOS 97  BILITOT 1.6*  PROT 7.0  ALBUMIN 4.0   CBC:  Recent Labs Lab 11/22/12 1830 11/23/12 1051 11/26/12 0500  WBC 8.8 8.9 8.1  NEUTROABS 5.3 6.9  --   HGB 7.7* 8.0* 8.7*  HCT 22.2* 22.7* 24.7*  MCV 97.4 95.8 95.4  PLT 312 293 290     Studies: No results found.  Scheduled Meds: . enoxaparin (LOVENOX) injection  40 mg Subcutaneous Q24H  . folic acid  1 mg Oral Daily  . hydroxyurea  2,000 mg Oral Q breakfast  . metoCLOPramide  5 mg Oral TID  AC  . morphine  60 mg Oral Q8H  . pantoprazole  40 mg Oral Q1200  . potassium chloride  40 mEq Oral Q4H   Continuous Infusions: . sodium chloride 50 mL/hr at 11/26/12 1517    Time spent: >38minutes   Thamas Appleyard  [facility] Pager [phone]. If 7PM-7AM, please contact night-coverage at [email or web address], password [facility] 11/26/2012, 10:18 PM  LOS: 4 days

## 2012-11-27 LAB — BASIC METABOLIC PANEL
CO2: 28 mEq/L (ref 19–32)
Chloride: 105 mEq/L (ref 96–112)
Potassium: 4 mEq/L (ref 3.5–5.1)
Sodium: 140 mEq/L (ref 135–145)

## 2012-11-27 LAB — MAGNESIUM: Magnesium: 2 mg/dL (ref 1.5–2.5)

## 2012-11-27 MED ORDER — HYDROXYUREA 500 MG PO CAPS: 2000.0000 mg | ORAL_CAPSULE | Freq: Every day | ORAL | Status: DC

## 2012-11-27 MED ORDER — PANTOPRAZOLE SODIUM 40 MG PO TBEC: 40.0000 mg | DELAYED_RELEASE_TABLET | Freq: Every day | ORAL | Status: DC

## 2012-11-27 MED ORDER — MORPHINE SULFATE ER 60 MG PO TBCR: 60.0000 mg | EXTENDED_RELEASE_TABLET | Freq: Three times a day (TID) | ORAL | Status: DC

## 2012-11-27 MED ORDER — FOLIC ACID 1 MG PO TABS: 1.0000 mg | ORAL_TABLET | Freq: Every day | ORAL | Status: DC

## 2012-11-27 MED ORDER — ALBUTEROL SULFATE HFA 108 (90 BASE) MCG/ACT IN AERS: 1.0000 | INHALATION_SPRAY | Freq: Four times a day (QID) | RESPIRATORY_TRACT | Status: DC | PRN

## 2012-11-27 MED ORDER — MORPHINE SULFATE 15 MG PO TABS: 15.0000 mg | ORAL_TABLET | ORAL | Status: DC | PRN

## 2012-11-27 NOTE — Discharge Summary (Addendum)
Physician Discharge Summary  Dennis Bernard [id number] DOB: July 03, 1986 DOA: 11/22/2012  PCP: Willey Blade, MD  Admit date: 11/22/2012 Discharge date: 11/27/2012  Time spent: >30 minutes  Recommendations for Outpatient Follow-up:  1. Repeat CBC and BMET during follow up visit 2. Reassess and adjust pain medications as needed  Discharge Diagnoses:  Sickle cell anemia with crisis Backache Nausea/vomiting Chronic pain syndrome hypokalemia GERD   Discharge Condition: stable and improved. Pain well controlled at discharge with PO regimen. A month supply of pain medications, hydroxyurea and folic acid given at discharge; patient advised to keep himself hydrated, to take medication as prescribed and to establish relationship with PCP and with pain clinic for longterm management of his medical problems.   Filed Weights   11/24/12 2201 11/25/12 2105 11/26/12 2020  Weight: 85.7 kg (188 lb 15 oz) 85.5 kg (188 lb 7.9 oz) 83 kg (182 lb 15.7 oz)    History of present illness:  27 yo AAM with a PMHx of Sickle cell disease and chronic opoid dependence/abuse who presents to [facility] ED with c/o sickle cell pain-chest and back. He is usually followed at [facility] as well as [facility] sickle cell clinic with Dr. Ashley Royalty and was a pt of Dr. [name] in the past. Pt states his goal pain level at home is a 2/10. He takes MS Contin and MS IR prn daily and says he has continued to do so up until ED presentation. His back pain began about 3 days ago, but has worsened to the point of being uncontrolled even with home meds. His chest pain started today. Some SOB when lying flat, but no DOE, wheezing or air hunger. He states his pain level now is a 8/10.   Hospital Course:  1-sickle cell anemia with acute pain crisis: No signs of acute chest syndrome.  -Pain is improved and well controlled with PO analgesic regimen  -discharge on Ms contin and morphine IR  -Continue hydroxyurea and folic acid  -Advice to establish care with  PCP and pain clinic (used to follow with Dr. [name]; no nobody seen him or prescribing medications to him).  2-hypokalemia: most likely due to decrease PO intake and vomiting. Repleted and Mg WNL; no further vomiting. Potassium at discharge 4.0  3-Chronic pain: treatment as mentioned above; will need pain clinic assessment and close outpatient followup. Patient do not have anybody to prescribe medications at this point; prior to admission ran out of meds and this is contributing to his difficulty controlling pain.  4-Nausea/vomiting: most likely associated to side effects from narcotics; other concerns will be gastroenteritis.  -resolved. -at discharge no nausea, no vomiting and keeping meds and diet.  5-Anemia: stable and at baseline. Due to sickle cell anemia.  6-GERD: started on PPI at discharge.  Procedures: See below for x-ray reports  Consultations:  none  Discharge Exam: Filed Vitals:   11/26/12 1800 11/26/12 2020 11/27/12 0457 11/27/12 1000  BP: 114/57 95/52 110/58 105/52  Pulse: 80 72 66 78  Temp: 99 F (37.2 C) 98.7 F (37.1 C) 98.2 F (36.8 C) 98.6 F (37 C)  TempSrc: Oral Oral Oral Oral  Resp: 20 18 18 18   Height:      Weight:  83 kg (182 lb 15.7 oz)    SpO2: 100% 100% 100% 100%    General: afebrile, no nausea, no vomiting and tolerating PO meds and diet Cardiovascular: S1 and S2; no rubs or gallops Respiratory: CTA bilaterally Abdomen: soft, NT, ND, positive BS Neuro: non focal  deficit.  Discharge Instructions  Discharge Orders   Future Orders Complete By Expires     Discharge instructions  As directed     Comments:      -Take medications as prescribed -Keep yourself hydrated -establish care with PCP and also with pain clinic        Medication List    TAKE these medications       albuterol 108 (90 BASE) MCG/ACT inhaler  Commonly known as:  PROVENTIL HFA;VENTOLIN HFA  Inhale 1 puff into the lungs every 6 (six) hours as needed for wheezing or  shortness of breath. wheezing     diphenhydrAMINE 25 MG tablet  Commonly known as:  BENADRYL  Take 25 mg by mouth every 8 (eight) hours as needed. For itching.     folic acid 1 MG tablet  Commonly known as:  FOLVITE  Take 1 tablet (1 mg total) by mouth daily.     hydroxyurea 500 MG capsule  Commonly known as:  HYDREA  Take 4 capsules (2,000 mg total) by mouth daily. May take with food to minimize GI side effects.     morphine 60 MG 12 hr tablet  Commonly known as:  MS CONTIN  Take 1 tablet (60 mg total) by mouth every 8 (eight) hours.     morphine 15 MG tablet  Commonly known as:  MSIR  Take 1 tablet (15 mg total) by mouth every 4 (four) hours as needed for pain (breakthrough pain). Pain     pantoprazole 40 MG tablet  Commonly known as:  PROTONIX  Take 1 tablet (40 mg total) by mouth daily at 12 noon.     promethazine 25 MG tablet  Commonly known as:  PHENERGAN  Take 25 mg by mouth every 6 (six) hours as needed for nausea. Nausea     senna-docusate 8.6-50 MG per tablet  Commonly known as:  Senokot-S  Take 1 tablet by mouth daily as needed. Constipation          The results of significant diagnostics from this hospitalization (including imaging, microbiology, ancillary and laboratory) are listed below for reference.    Significant Diagnostic Studies: Dg Chest 2 View  11/22/2012  *RADIOLOGY REPORT*  Clinical Data: Chest pain, sickle cell disease  CHEST - 2 VIEW  Comparison: 11/10/2012  Findings: Left humeral replacement noted.  Left sided Port-A-Cath in place with tip over the high right atrium.  Heart size is moderately enlarged.  Chronically prominent interstitial lung lesions are noted bilaterally without focal pulmonary opacity. Support apparatus obscure detail over the left lung apex.  No pleural effusion.  IMPRESSION: Stable findings of low lung volumes with crowding of the chronically prominent bronchovascular markings but no new focal pulmonary opacity.   Original  Report Authenticated By: Christiana Pellant, M.D.    Dg Chest 2 View  11/10/2012  *RADIOLOGY REPORT*  Clinical Data: Cough and fever  CHEST - 2 VIEW  Comparison: 11/08/2012  Findings: Left chest wall porta-catheter is noted with tip in the cavoatrial junction.  Mild cardiac enlargement is stable from previous exam.  There is pulmonary venous congestion.  No overt edema.  No pleural effusion identified.  There is no airspace consolidation. The skeletal changes of sickle cell disease are noted.  IMPRESSION:  1.  Cardiac enlargement and pulmonary venous congestion. 2.  No focal airspace consolidation identified.   Original Report Authenticated By: Signa Kell, M.D.    Dg Chest 2 View  11/08/2012  *RADIOLOGY REPORT*  Clinical Data: Shortness  of breath and fever; history of sickle cell disease  CHEST - 2 VIEW  Comparison: October 21, 2012  Findings:  No edema or consolidation.  Heart size and pulmonary vascularity are normal.  No adenopathy.  Port-A-Cath tip is in the right atrium.  No pneumothorax.  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  IMPRESSION: Bony changes consistent with known sickle cell disease.  No edema or consolidation.   Original Report Authenticated By: Bretta Bang, M.D.     Labs: Basic Metabolic Panel:  Recent Labs Lab 11/22/12 1830 11/23/12 1051 11/26/12 0500 11/27/12 0500  NA 141 138 138 140  K 3.4* 3.6 2.8* 4.0  CL 107 106 102 105  CO2 23 25 27 28   GLUCOSE 92 127* 98 96  BUN 8 6 7 8   CREATININE 0.56 0.54 0.57 0.58  CALCIUM 9.1 8.7 9.0 8.7  MG  --   --   --  2.0   Liver Function Tests:  Recent Labs Lab 11/22/12 1830  AST 27  ALT 26  ALKPHOS 97  BILITOT 1.6*  PROT 7.0  ALBUMIN 4.0   CBC:  Recent Labs Lab 11/22/12 1830 11/23/12 1051 11/26/12 0500  WBC 8.8 8.9 8.1  NEUTROABS 5.3 6.9  --   HGB 7.7* 8.0* 8.7*  HCT 22.2* 22.7*  24.7*  MCV 97.4 95.8 95.4  PLT 312 293 290    Signed:  Naesha Buckalew  [facility] 11/27/2012, 11:50 AM

## 2012-11-27 NOTE — Progress Notes (Signed)
Patient still complaining of back pain. Requested more pain meds. On call MD made aware. No additional pain meds ordered or given. Will continue to monitor.  Evanee Lubrano, RN

## 2012-12-09 ENCOUNTER — Inpatient Hospital Stay (HOSPITAL_COMMUNITY)
Admission: EM | Admit: 2012-12-09 | Discharge: 2012-12-12 | DRG: 812 | Disposition: A | Discharge status: Home / Self Care | Payer: Medicare Other | Attending: Internal Medicine | Admitting: Internal Medicine

## 2012-12-09 ENCOUNTER — Encounter (HOSPITAL_COMMUNITY): Payer: Self-pay | Admitting: Emergency Medicine

## 2012-12-09 ENCOUNTER — Emergency Department (HOSPITAL_COMMUNITY): Payer: Medicare Other

## 2012-12-09 DIAGNOSIS — D571 Sickle-cell disease without crisis: Secondary | ICD-10-CM

## 2012-12-09 DIAGNOSIS — D72829 Elevated white blood cell count, unspecified: Secondary | ICD-10-CM

## 2012-12-09 DIAGNOSIS — R05 Cough: Secondary | ICD-10-CM

## 2012-12-09 DIAGNOSIS — E876 Hypokalemia: Secondary | ICD-10-CM

## 2012-12-09 DIAGNOSIS — R0789 Other chest pain: Secondary | ICD-10-CM

## 2012-12-09 DIAGNOSIS — D473 Essential (hemorrhagic) thrombocythemia: Secondary | ICD-10-CM

## 2012-12-09 DIAGNOSIS — D5700 Hb-SS disease with crisis, unspecified: Principal | ICD-10-CM

## 2012-12-09 DIAGNOSIS — D75839 Thrombocytosis, unspecified: Secondary | ICD-10-CM

## 2012-12-09 DIAGNOSIS — M545 Low back pain, unspecified: Secondary | ICD-10-CM

## 2012-12-09 DIAGNOSIS — G8929 Other chronic pain: Secondary | ICD-10-CM

## 2012-12-09 DIAGNOSIS — M549 Dorsalgia, unspecified: Secondary | ICD-10-CM

## 2012-12-09 DIAGNOSIS — R059 Cough, unspecified: Secondary | ICD-10-CM

## 2012-12-09 DIAGNOSIS — R894 Abnormal immunological findings in specimens from other organs, systems and tissues: Secondary | ICD-10-CM

## 2012-12-09 LAB — POCT I-STAT TROPONIN I: Troponin i, poc: 0 ng/mL (ref 0.00–0.08)

## 2012-12-09 LAB — BASIC METABOLIC PANEL
BUN: 9 mg/dL (ref 6–23)
CO2: 26 mEq/L (ref 19–32)
Calcium: 9.8 mg/dL (ref 8.4–10.5)
GFR calc non Af Amer: >90 mL/min (ref >90)
Glucose, Bld: 90 mg/dL (ref 70–99)
Potassium: 3.9 mEq/L (ref 3.5–5.1)

## 2012-12-09 LAB — CBC WITH DIFFERENTIAL/PLATELET
Band Neutrophils: 5 % (ref 0–10)
Basophils Absolute: 0 10*3/uL (ref 0.0–0.1)
Basophils Relative: 0 % (ref 0–1)
Blasts: 0 %
Eosinophils Absolute: 0 K/uL (ref 0.0–0.7)
Eosinophils Relative: 0 % (ref 0–5)
HCT: 26.6 % — ABNORMAL LOW (ref 39.0–52.0)
Hemoglobin: 9.1 g/dL — ABNORMAL LOW (ref 13.0–17.0)
Lymphocytes Relative: 27 % (ref 12–46)
Lymphs Abs: 3 10*3/uL (ref 0.7–4.0)
MCH: 34.1 pg — ABNORMAL HIGH (ref 26.0–34.0)
MCHC: 34.2 g/dL (ref 30.0–36.0)
MCV: 99.6 fL (ref 78.0–100.0)
Metamyelocytes Relative: 0 %
Monocytes Absolute: 0.2 10*3/uL (ref 0.1–1.0)
Monocytes Relative: 2 % — ABNORMAL LOW (ref 3–12)
Myelocytes: 0 %
Neutro Abs: 7.9 10*3/uL — ABNORMAL HIGH (ref 1.7–7.7)
Neutrophils Relative %: 66 % (ref 43–77)
Platelets: 417 K/uL — ABNORMAL HIGH (ref 150–400)
Promyelocytes Absolute: 0 %
RBC: 2.67 MIL/uL — ABNORMAL LOW (ref 4.22–5.81)
RDW: 17.4 % — ABNORMAL HIGH (ref 11.5–15.5)
WBC: 11.1 10*3/uL — ABNORMAL HIGH (ref 4.0–10.5)
nRBC: 8 /100{WBCs} — ABNORMAL HIGH

## 2012-12-09 LAB — BASIC METABOLIC PANEL WITH GFR
Chloride: 98 meq/L (ref 96–112)
Creatinine, Ser: 0.47 mg/dL — ABNORMAL LOW (ref 0.50–1.35)
GFR calc Af Amer: >90 mL/min (ref >90)
Sodium: 135 meq/L (ref 135–145)

## 2012-12-09 LAB — RETICULOCYTES
RBC.: 2.67 MIL/uL — ABNORMAL LOW (ref 4.22–5.81)
Retic Count, Absolute: 352.4 10*3/uL — ABNORMAL HIGH (ref 19.0–186.0)
Retic Ct Pct: 13.2 % — ABNORMAL HIGH (ref 0.4–3.1)

## 2012-12-09 MED ORDER — DIPHENHYDRAMINE HCL 50 MG/ML IJ SOLN
25.0000 mg | Freq: Once | INTRAMUSCULAR | Status: AC
  Administered 2012-12-09: 25 mg via INTRAVENOUS
  Filled 2012-12-09: qty 1

## 2012-12-09 MED ORDER — DIPHENHYDRAMINE HCL 25 MG PO TABS
25.0000 mg | ORAL_TABLET | Freq: Three times a day (TID) | ORAL | Status: DC | PRN
  Administered 2012-12-12: 25 mg via ORAL
  Filled 2012-12-09 (×3): qty 1

## 2012-12-09 MED ORDER — SODIUM CHLORIDE 0.9 % IV SOLN
INTRAVENOUS | Status: DC
  Administered 2012-12-09 (×2): via INTRAVENOUS

## 2012-12-09 MED ORDER — DIPHENHYDRAMINE HCL 12.5 MG/5ML PO ELIX: 12.5000 mg | ORAL_SOLUTION | Freq: Four times a day (QID) | ORAL | Status: DC | PRN

## 2012-12-09 MED ORDER — HYDROXYUREA 500 MG PO CAPS
2000.0000 mg | ORAL_CAPSULE | Freq: Every day | ORAL | Status: DC
  Administered 2012-12-10 – 2012-12-12 (×3): 2000 mg via ORAL
  Filled 2012-12-09 (×3): qty 4

## 2012-12-09 MED ORDER — PROMETHAZINE HCL 12.5 MG RE SUPP
12.5000 mg | RECTAL | Status: DC | PRN
  Filled 2012-12-09: qty 2

## 2012-12-09 MED ORDER — DIPHENHYDRAMINE HCL 50 MG/ML IJ SOLN
12.5000 mg | Freq: Four times a day (QID) | INTRAMUSCULAR | Status: DC | PRN
  Administered 2012-12-11 – 2012-12-12 (×3): 12.5 mg via INTRAVENOUS
  Filled 2012-12-09: qty 1

## 2012-12-09 MED ORDER — ENOXAPARIN SODIUM 40 MG/0.4ML ~~LOC~~ SOLN
40.0000 mg | SUBCUTANEOUS | Status: DC
  Administered 2012-12-09 – 2012-12-11 (×3): 40 mg via SUBCUTANEOUS
  Filled 2012-12-09 (×4): qty 0.4

## 2012-12-09 MED ORDER — HYDROMORPHONE HCL PF 1 MG/ML IJ SOLN
1.0000 mg | INTRAMUSCULAR | Status: AC
  Administered 2012-12-09: 1 mg via INTRAVENOUS
  Filled 2012-12-09: qty 1

## 2012-12-09 MED ORDER — MORPHINE SULFATE 15 MG PO TABS: 15.0000 mg | ORAL_TABLET | ORAL | Status: DC | PRN

## 2012-12-09 MED ORDER — HYDROMORPHONE HCL PF 1 MG/ML IJ SOLN
1.0000 mg | Freq: Once | INTRAMUSCULAR | Status: AC
  Administered 2012-12-09: 1 mg via INTRAVENOUS
  Filled 2012-12-09: qty 1

## 2012-12-09 MED ORDER — NALOXONE HCL 0.4 MG/ML IJ SOLN: 0.4000 mg | INTRAMUSCULAR | Status: DC | PRN

## 2012-12-09 MED ORDER — MORPHINE SULFATE ER 30 MG PO TBCR
60.0000 mg | EXTENDED_RELEASE_TABLET | Freq: Three times a day (TID) | ORAL | Status: DC
  Administered 2012-12-09 – 2012-12-12 (×8): 60 mg via ORAL
  Filled 2012-12-09 (×10): qty 2

## 2012-12-09 MED ORDER — SODIUM CHLORIDE 0.9 % IJ SOLN: 9.0000 mL | INTRAMUSCULAR | Status: DC | PRN

## 2012-12-09 MED ORDER — ALUM & MAG HYDROXIDE-SIMETH 200-200-20 MG/5ML PO SUSP: 15.0000 mL | ORAL | Status: DC | PRN

## 2012-12-09 MED ORDER — PANTOPRAZOLE SODIUM 40 MG PO TBEC
40.0000 mg | DELAYED_RELEASE_TABLET | Freq: Every day | ORAL | Status: DC
  Administered 2012-12-10 – 2012-12-12 (×3): 40 mg via ORAL
  Filled 2012-12-09 (×3): qty 1

## 2012-12-09 MED ORDER — HYDROMORPHONE HCL PF 2 MG/ML IJ SOLN
3.0000 mg | Freq: Once | INTRAMUSCULAR | Status: AC
  Administered 2012-12-09: 3 mg via INTRAVENOUS
  Filled 2012-12-09: qty 2

## 2012-12-09 MED ORDER — DIPHENHYDRAMINE HCL 50 MG/ML IJ SOLN: 12.5000 mg | Freq: Once | INTRAMUSCULAR | Status: DC

## 2012-12-09 MED ORDER — FOLIC ACID 1 MG PO TABS: 1.0000 mg | ORAL_TABLET | Freq: Every day | ORAL | Status: DC

## 2012-12-09 MED ORDER — ONDANSETRON HCL 4 MG/2ML IJ SOLN
4.0000 mg | Freq: Once | INTRAMUSCULAR | Status: AC
  Administered 2012-12-09: 4 mg via INTRAVENOUS
  Filled 2012-12-09: qty 2

## 2012-12-09 MED ORDER — ALBUTEROL SULFATE HFA 108 (90 BASE) MCG/ACT IN AERS
1.0000 | INHALATION_SPRAY | Freq: Four times a day (QID) | RESPIRATORY_TRACT | Status: DC | PRN
  Filled 2012-12-09: qty 6.7

## 2012-12-09 MED ORDER — PROMETHAZINE HCL 25 MG PO TABS: 12.5000 mg | ORAL_TABLET | ORAL | Status: DC | PRN

## 2012-12-09 MED ORDER — HYDROMORPHONE HCL PF 1 MG/ML IJ SOLN
1.0000 mg | INTRAMUSCULAR | Status: DC
  Administered 2012-12-09 – 2012-12-10 (×3): 2 mg via INTRAVENOUS
  Filled 2012-12-09 (×3): qty 2

## 2012-12-09 MED ORDER — SENNOSIDES-DOCUSATE SODIUM 8.6-50 MG PO TABS
1.0000 | ORAL_TABLET | Freq: Every day | ORAL | Status: DC | PRN
  Filled 2012-12-09: qty 1

## 2012-12-09 MED ORDER — KETOROLAC TROMETHAMINE 15 MG/ML IJ SOLN
15.0000 mg | Freq: Four times a day (QID) | INTRAMUSCULAR | Status: DC
  Administered 2012-12-09 – 2012-12-10 (×2): 15 mg via INTRAVENOUS
  Filled 2012-12-09 (×9): qty 1

## 2012-12-09 MED ORDER — PROMETHAZINE HCL 25 MG PO TABS: 25.0000 mg | ORAL_TABLET | Freq: Four times a day (QID) | ORAL | Status: DC | PRN

## 2012-12-09 MED ORDER — HYDROMORPHONE 0.3 MG/ML IV SOLN
INTRAVENOUS | Status: DC
  Administered 2012-12-09: 0.3 mg via INTRAVENOUS
  Administered 2012-12-10: 02:00:00 via INTRAVENOUS
  Administered 2012-12-10: 2.4 mg via INTRAVENOUS
  Administered 2012-12-10: 3.6 mg via INTRAVENOUS
  Administered 2012-12-10: 5.9 mg via INTRAVENOUS
  Filled 2012-12-09 (×2): qty 25

## 2012-12-09 MED ORDER — HYDROMORPHONE HCL PF 1 MG/ML IJ SOLN: 1.0000 mg | INTRAMUSCULAR | Status: DC

## 2012-12-09 MED ORDER — FOLIC ACID 1 MG PO TABS
1.0000 mg | ORAL_TABLET | Freq: Every day | ORAL | Status: DC
  Administered 2012-12-10 – 2012-12-12 (×3): 1 mg via ORAL
  Filled 2012-12-09 (×3): qty 1

## 2012-12-09 NOTE — ED Provider Notes (Signed)
History     CSN: [id number]  Arrival date & time 12/09/12  1255   First MD Initiated Contact with Patient 12/09/12 1407      Chief Complaint  Patient presents with  . Sickle Cell Pain Crisis    (Consider location/radiation/quality/duration/timing/severity/associated sxs/prior treatment) HPI Comments: Level 5 caveat due to patient acuity.  Patient with known history of sickle cell disease, SS followed by Dr. Willey Blade. Patient reports he was not aware that there was a sickle cell clinic in existence. He reports he has had a mild dry cough with some associated chest discomfort which he attributes to the cough. Otherwise 2 days ago he began having some low back pain along with some left hip pain which is very typical of his sickle cell crises. Patient takes 60 mg of MS Contin daily twice a day. His last dose was last night. He also takes immediate release morphine 15 mg as needed. He admits that he was running low on medication but couldn't taken some this morning, instead chose to come here to the emergency department. He also takes folic acid and hydroxyurea. He reports normally when he comes to the emergency department, he receives IV Dilaudid, IV Benadryl and IV Zofran. He has had acute chest syndrome in the past, but this does not feel like that prior episode.    Patient is a 27 y.o. male presenting with sickle cell pain. The history is provided by the patient.  Sickle Cell Pain Crisis  Associated symptoms include chest pain and cough. Pertinent negatives include no nausea, no vomiting, no congestion and no rhinorrhea.    Past Medical History  Diagnosis Date  . Sickle cell disease   . Avascular necrosis of femur head, right   . H/O allergic rhinitis   . H/O hypokalemia   . Chronic pain syndrome   . H/O wheezing     with colds    Past Surgical History  Procedure Laterality Date  . Cholecystectomy  1995  . Left shoulder arthroplasty  04/09/2008  . Left pac placement  07/04/2012   . Exchange transfusion  02/2008    perioperatively for shoulder surgery    Family History  Problem Relation Age of Onset  . Sickle cell anemia Brother   . Sickle cell trait Father   . Sickle cell trait Mother   . Diabetes Mellitus II Mother   . Sickle cell anemia Paternal Uncle   . Asthma Neg Hx   . Allergic rhinitis Neg Hx     History  Substance Use Topics  . Smoking status: Never Smoker   . Smokeless tobacco: Never Used  . Alcohol Use: No      Review of Systems  Constitutional: Negative for fever and chills.  HENT: Negative for congestion and rhinorrhea.   Respiratory: Positive for cough. Negative for shortness of breath.   Cardiovascular: Positive for chest pain. Negative for leg swelling.  Gastrointestinal: Negative for nausea and vomiting.  Musculoskeletal: Positive for arthralgias.  All other systems reviewed and are negative.    Allergies  Review of patient's allergies indicates no known allergies.  Home Medications   Current Outpatient Rx  Name  Route  Sig  Dispense  Refill  . albuterol (PROVENTIL HFA;VENTOLIN HFA) 108 (90 BASE) MCG/ACT inhaler   Inhalation   Inhale 1 puff into the lungs every 6 (six) hours as needed for wheezing or shortness of breath. wheezing   1 Inhaler   1   . diphenhydrAMINE (BENADRYL) 25 MG tablet  Oral   Take 25 mg by mouth every 8 (eight) hours as needed. For itching.         . folic acid (FOLVITE) 1 MG tablet   Oral   Take 1 tablet (1 mg total) by mouth daily.   30 tablet   1   . hydroxyurea (HYDREA) 500 MG capsule   Oral   Take 4 capsules (2,000 mg total) by mouth daily. May take with food to minimize GI side effects.   120 capsule   1   . morphine (MS CONTIN) 60 MG 12 hr tablet   Oral   Take 1 tablet (60 mg total) by mouth every 8 (eight) hours.   90 tablet   0   . morphine (MSIR) 15 MG tablet   Oral   Take 1 tablet (15 mg total) by mouth every 4 (four) hours as needed for pain (breakthrough pain). Pain    90 tablet   0   . pantoprazole (PROTONIX) 40 MG tablet   Oral   Take 1 tablet (40 mg total) by mouth daily at 12 noon.   30 tablet   1   . promethazine (PHENERGAN) 25 MG tablet   Oral   Take 25 mg by mouth every 6 (six) hours as needed for nausea.          Marland Kitchen senna-docusate (SENOKOT-S) 8.6-50 MG per tablet   Oral   Take 1 tablet by mouth daily as needed for constipation.            BP 100/53  Pulse 85  Temp(Src) 98.6 F (37 C) (Oral)  Resp 13  SpO2 95%  Physical Exam  Nursing note and vitals reviewed. Constitutional: He is oriented to person, place, and time. He appears well-developed and well-nourished. He appears distressed.  HENT:  Head: Normocephalic and atraumatic.  Eyes: EOM are normal. No scleral icterus.  Neck: Neck supple.  Cardiovascular: Normal rate and regular rhythm.   Pulmonary/Chest: Effort normal.  Abdominal: Soft.  Musculoskeletal:       Right hip: Normal.       Left hip: Normal.       Lumbar back: He exhibits tenderness. He exhibits no bony tenderness, no deformity, no laceration and normal pulse.  Neurological: He is alert and oriented to person, place, and time.  Skin: Skin is warm. He is not diaphoretic.  Psychiatric: His mood appears anxious. His speech is not delayed and not slurred. He is not agitated, not aggressive, not hyperactive, not slowed and not combative. He is communicative.    ED Course  Procedures (including critical care time)  Labs Reviewed  CBC WITH DIFFERENTIAL - Abnormal; Notable for the following:    WBC 11.1 (*)    RBC 2.67 (*)    Hemoglobin 9.1 (*)    HCT 26.6 (*)    MCH 34.1 (*)    RDW 17.4 (*)    Platelets 417 (*)    Monocytes Relative 2 (*)    nRBC 8 (*)    Neutro Abs 7.9 (*)    All other components within normal limits  BASIC METABOLIC PANEL - Abnormal; Notable for the following:    Creatinine, Ser 0.47 (*)    All other components within normal limits  RETICULOCYTES - Abnormal; Notable for the  following:    Retic Ct Pct 13.2 (*)    RBC. 2.67 (*)    Retic Count, Manual 352.4 (*)    All other components within normal limits  POCT  I-STAT TROPONIN I   Dg Chest Port 1 View  12/09/2012  *RADIOLOGY REPORT*  Clinical Data: Chest pain  PORTABLE CHEST - 1 VIEW  Comparison: 11/22/2012  Findings: The left chest port is again seen.  The cardiac shadow is stable.  The overall level of inspiratory effort is poor.  Some crowding of vascular markings is again noted although no focal infiltrate is seen.  IMPRESSION: Poor inspiratory effort.  No acute abnormality is noted.   Original Report Authenticated By: Alcide Clever, M.D.      1. Sickle-cell crisis   2. Cough   3. Musculoskeletal chest pain     EKG performed at time 14:32, shows sinus rhythm at rate of 83, normal axis. Borderline lateral Q waves noted. Nonspecific T wave inversions seen in the inferior leads. Interpretation is borderline EKG. EKG is nearly identical compared to that from 11/22/2012.   5:14 PM After initial 3 mg, another 1 mg given, pt again feels no sig improvement.  I spoke to Dr. Ashley Royalty, sickle clinic is not currently open . Spoke to Dr. Darnelle Catalan who will see and admit pt.  MDM   Patient here with apparently typical sickle cell crisis pain. Patient has had a mild cough and associated chest discomfort which I interpret to be related to his cough. No ischemic changes on EKG and troponin is negative. Patient has had acute chest syndrome in the past but this pain today is much mild compared to the prior pain. His primary complaint is in his low back and hips. Patient has had no significant reduction in pain after initial bolus of 3 mg of IV Dilaudid with associated Benadryl and Zofran. No vomiting seen here in emergency department. Vital signs otherwise remain stable. Likely I will consult with sickle cell clinic and consider transfer to the sickle cell for continued monitoring and pain control.        Gavin Pound. Kirti Carl,  MD 12/09/12 1715

## 2012-12-09 NOTE — ED Notes (Signed)
Patient with h/o sickle cell presents with upper left leg and lower back pain x 2 days.  Patient denies SOB/fevers.  Patient reports nasal congestion and dry cough.

## 2012-12-09 NOTE — H&P (Signed)
[facility] History and Physical  [name] WUJ:[id number] DOB: 1986/05/16 DOA: 12/09/2012  Referring physician: Dr. Quita Skye PCP: [name] [name], MD   Chief Complaint: Sickle cell crisis   History of Present Illness: Dennis Bernard is an 27 y.o. male with a PMH of SSA who presents with a 2 day history of pain in his lower back and left leg.  Pain is not relieved with usual outpatient pain medications.  No associated N/V, F/C.  Pain is rated a 10/10 at its worst, and is described as sharp.  No aggravating factors.  Dilaudid PCA typically alleviates the pain.    Review of Systems: Constitutional: No fever, no chills;  Appetite diminished; No weight loss, no weight gain.  HEENT: No blurry vision, no diplopia, no pharyngitis, no dysphagia CV: No chest pain, + palpitations.  Resp: No SOB, + non-productive cough, +chest congestion. GI: No nausea, no vomiting, no diarrhea, no melena, no hematochezia.  GU: No dysuria, no hematuria.  MSK: + myalgias and arthralgias.  Neuro:  No headache, no focal neurological deficits, no history of seizures.  Psych: No depression, no anxiety.  Endo: No thyroid disease, no DM, no heat intolerance, no cold intolerance, no polyuria, no polydipsia  Skin: No rashes, no skin lesions.  Heme: No easy bruising, + history of SSA.  Past Medical History Past Medical History  Diagnosis Date  . Sickle cell disease   . Avascular necrosis of femur head, right   . H/O allergic rhinitis   . H/O hypokalemia   . Chronic pain syndrome   . H/O wheezing     with colds     Past Surgical History Past Surgical History  Procedure Laterality Date  . Cholecystectomy  1995  . Left shoulder arthroplasty  04/09/2008  . Left pac placement  07/04/2012  . Exchange transfusion  02/2008    perioperatively for shoulder surgery     Social History: History   Social History  . Marital Status: Single    Spouse Name: N/A    Number of Children: 0  . Years of Education:  N/A   Occupational History  . Student    Social History Main Topics  . Smoking status: Never Smoker   . Smokeless tobacco: Never Used  . Alcohol Use: No  . Drug Use: No  . Sexually Active: No   Other Topics Concern  . Not on file   Social History Narrative   Patient lives in [place].   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   Is from [place].   Usually followed at [facility] for his sickle cell disease.  Primary hematologist Dr. James Ivanoff at [facility].               Family History:  Family History  Problem Relation Age of Onset  . Sickle cell anemia Brother   . Sickle cell trait Father   . Sickle cell trait Mother   . Diabetes Mellitus II Mother   . Sickle cell anemia Paternal Uncle   . Asthma Neg Hx   . Allergic rhinitis Neg Hx     Allergies: Review of patient's allergies indicates no known allergies.  Meds: Prior to Admission medications   Medication Sig Start Date End Date Taking? Authorizing Provider  albuterol (PROVENTIL HFA;VENTOLIN HFA) 108 (90 BASE) MCG/ACT inhaler Inhale 1 puff into the lungs every 6 (six) hours as needed for wheezing or shortness of breath. wheezing 11/27/12  Yes Vassie Loll, MD  diphenhydrAMINE (BENADRYL) 25 MG tablet Take 25 mg  by mouth every 8 (eight) hours as needed. For itching. 08/23/12  Yes Dow Adolph, MD  folic acid (FOLVITE) 1 MG tablet Take 1 tablet (1 mg total) by mouth daily. 11/27/12  Yes Vassie Loll, MD  hydroxyurea (HYDREA) 500 MG capsule Take 4 capsules (2,000 mg total) by mouth daily. May take with food to minimize GI side effects. 11/27/12  Yes Vassie Loll, MD  morphine (MS CONTIN) 60 MG 12 hr tablet Take 1 tablet (60 mg total) by mouth every 8 (eight) hours. 11/27/12  Yes Vassie Loll, MD  morphine (MSIR) 15 MG tablet Take 1 tablet (15 mg total) by mouth every 4 (four) hours as needed for pain (breakthrough pain). Pain 11/27/12  Yes Vassie Loll, MD  pantoprazole (PROTONIX) 40 MG tablet Take 1 tablet (40 mg total) by mouth daily  at 12 noon. 11/27/12  Yes Vassie Loll, MD  promethazine (PHENERGAN) 25 MG tablet Take 25 mg by mouth every 6 (six) hours as needed for nausea.  10/09/12  Yes Novlet Adelina Mings, MD  senna-docusate (SENOKOT-S) 8.6-50 MG per tablet Take 1 tablet by mouth daily as needed for constipation.  08/27/12  Yes Linward Headland, MD    Physical Exam: Filed Vitals:   12/09/12 1700 12/09/12 1715 12/09/12 1730 12/09/12 1745  BP: 97/51 97/42 86/72  105/63  Pulse: 84 80 79 81  Temp:      TempSrc:      Resp: 12 13 10 18   SpO2: 95% 95% 95% 97%     Physical Exam: Blood pressure 105/63, pulse 81, temperature 98.6 F (37 C), temperature source Oral, resp. rate 18, SpO2 97.00%. Gen: No acute distress. Head: Normocephalic, atraumatic. Eyes: PERRL, EOMI, sclerae nonicteric. Mouth: Oropharynx clear. Neck: Supple, no thyromegaly, no lymphadenopathy, no jugular venous distention. Chest: Lungs clear to auscultation bilaterally. CV: Heart sounds regular, without murmurs, rubs, or gallops. Abdomen: Soft, nontender, nondistended with normal active bowel sounds. Extremities: Extremities without clubbing, edema, or cyanosis. Skin: Warm and dry. Neuro: Alert and oriented times 3; cranial nerves II through XII grossly intact. Psych: Mood and affect normal.  Labs on Admission:  Basic Metabolic Panel:  Recent Labs Lab 12/09/12 1400  NA 135  K 3.9  CL 98  CO2 26  GLUCOSE 90  BUN 9  CREATININE 0.47*  CALCIUM 9.8   CBC:  Recent Labs Lab 12/09/12 1400  WBC 11.1*  NEUTROABS 7.9*  HGB 9.1*  HCT 26.6*  MCV 99.6  PLT 417*   Radiological Exams on Admission: Dg Chest Port 1 View  12/09/2012  *RADIOLOGY REPORT*  Clinical Data: Chest pain  PORTABLE CHEST - 1 VIEW  Comparison: 11/22/2012  Findings: The left chest port is again seen.  The cardiac shadow is stable.  The overall level of inspiratory effort is poor.  Some crowding of vascular markings is again noted although no focal infiltrate is seen.  IMPRESSION:  Poor inspiratory effort.  No acute abnormality is noted.   Original Report Authenticated By: Alcide Clever, M.D.     EKG: Independently reviewed. T wave inversions in lead 3 and aVF. These are not new when compared with an EKG dated 11/22/2012.  Assessment/Plan Principal Problem:   Sickle cell pain crisis in a patient with sickle cell anemia -Will admit the patient to the hospital and attempt pain control with a combination of standing dose of Dilaudid-HP and PCA. -Will administer Toradol 15 mg IV every 6 hours for anti-inflammatory effect. -Continue home dose of Hydrea and folic acid. -Gently hydrate. -No  current indication for blood transfusion. -The sickle cell team the patient up in the morning for further treatment recommendations. Active Problems:   Leukocytosis -Mild, likely related to inflammation. -Chest x-ray clear with no evidence of pneumonia or acute chest syndrome.   Thrombocytosis -Likely an acute phase reaction.   Chronic pain -Continue chronic pain management regimen.   Code Status: Full. Family Communication: None at bedside. Disposition Plan: Home when stable.  Time spent: 1 hour.  RAMA,CHRISTINA [facility] Pager [phone]  If 7PM-7AM, please contact night-coverage [email or web address] Password [facility] 12/09/2012, 6:03 PM

## 2012-12-09 NOTE — ED Notes (Signed)
States that he has been having left upper leg pain and low back pain for the past 2 days with no resolve in pain after taking MS Contin. States that his last dose was last pm. States that he has had the dry cough and congestion for the past 2 days.

## 2012-12-10 DIAGNOSIS — M545 Low back pain, unspecified: Secondary | ICD-10-CM

## 2012-12-10 DIAGNOSIS — R0789 Other chest pain: Secondary | ICD-10-CM

## 2012-12-10 DIAGNOSIS — G8929 Other chronic pain: Secondary | ICD-10-CM

## 2012-12-10 DIAGNOSIS — D72829 Elevated white blood cell count, unspecified: Secondary | ICD-10-CM

## 2012-12-10 MED ORDER — SODIUM CHLORIDE 0.45 % IV SOLN
INTRAVENOUS | Status: DC
  Administered 2012-12-10 – 2012-12-12 (×3): via INTRAVENOUS

## 2012-12-10 MED ORDER — DIPHENHYDRAMINE HCL 50 MG/ML IJ SOLN
12.5000 mg | Freq: Four times a day (QID) | INTRAMUSCULAR | Status: DC | PRN
  Filled 2012-12-10 (×2): qty 1

## 2012-12-10 MED ORDER — SODIUM CHLORIDE 0.9 % IJ SOLN
9.0000 mL | INTRAMUSCULAR | Status: DC | PRN
  Administered 2012-12-12: 9 mL via INTRAVENOUS

## 2012-12-10 MED ORDER — DIPHENHYDRAMINE HCL 12.5 MG/5ML PO ELIX: 12.5000 mg | ORAL_SOLUTION | Freq: Four times a day (QID) | ORAL | Status: DC | PRN

## 2012-12-10 MED ORDER — ONDANSETRON HCL 4 MG/2ML IJ SOLN
4.0000 mg | Freq: Four times a day (QID) | INTRAMUSCULAR | Status: DC | PRN
  Administered 2012-12-11 – 2012-12-12 (×3): 4 mg via INTRAVENOUS
  Filled 2012-12-10 (×3): qty 2

## 2012-12-10 MED ORDER — NALOXONE HCL 0.4 MG/ML IJ SOLN: 0.4000 mg | INTRAMUSCULAR | Status: DC | PRN

## 2012-12-10 MED ORDER — HYDROMORPHONE 0.3 MG/ML IV SOLN
INTRAVENOUS | Status: DC
  Administered 2012-12-10 (×2): via INTRAVENOUS
  Administered 2012-12-10: 8.46 mg via INTRAVENOUS
  Administered 2012-12-10: 1.99 mg via INTRAVENOUS
  Administered 2012-12-10: 19:00:00 via INTRAVENOUS
  Administered 2012-12-10: 7.49 mg via INTRAVENOUS
  Administered 2012-12-10: 22:00:00 via INTRAVENOUS
  Administered 2012-12-11: 2.49 mg via INTRAVENOUS
  Administered 2012-12-11: 5.15 mg via INTRAVENOUS
  Administered 2012-12-11: 15:00:00 via INTRAVENOUS
  Administered 2012-12-11: 7.14 mg via INTRAVENOUS
  Administered 2012-12-11 (×2): via INTRAVENOUS
  Administered 2012-12-11: 7.49 mg via INTRAVENOUS
  Administered 2012-12-11: 19:00:00 via INTRAVENOUS
  Administered 2012-12-11: 5.14 mg via INTRAVENOUS
  Administered 2012-12-11 (×2): via INTRAVENOUS
  Administered 2012-12-11: 9.98 mg via INTRAVENOUS
  Administered 2012-12-11: 6.75 mg via INTRAVENOUS
  Administered 2012-12-11: 3.99 mg via INTRAVENOUS
  Administered 2012-12-12: 9 mL via INTRAVENOUS
  Administered 2012-12-12 (×2): via INTRAVENOUS
  Administered 2012-12-12: 4.64 mg via INTRAVENOUS
  Administered 2012-12-12: 4.99 mg via INTRAVENOUS
  Administered 2012-12-12: 14.29 mg via INTRAVENOUS
  Filled 2012-12-10 (×12): qty 25

## 2012-12-10 MED ORDER — KETOROLAC TROMETHAMINE 15 MG/ML IJ SOLN
30.0000 mg | Freq: Four times a day (QID) | INTRAMUSCULAR | Status: DC
  Administered 2012-12-10 – 2012-12-11 (×5): 30 mg via INTRAVENOUS
  Filled 2012-12-10: qty 1
  Filled 2012-12-10: qty 2
  Filled 2012-12-10: qty 1
  Filled 2012-12-10 (×3): qty 2

## 2012-12-10 NOTE — Progress Notes (Signed)
[facility] PROGRESS NOTE  [name] MWU:[id number] DOB: [birth year] DOA: 12/09/2012 PCP: [name] [name], MD  Assessment/Plan: Principal Problem:  Sickle cell pain crisis in a patient with sickle cell anemia  Patient admitted with sickle cell pain. Change patient to customize PCA Dilaudid. Changed to half  normal saline, patient has heating pad. Toradol was prescribed for inflammatory component. Will continue to monitor.    Leukocytosis  Most likely secondary to increased bone marrow turnover second to sickle cell crisis Chest x-ray clear with no evidence of pneumonia or acute chest syndrome.   Thrombocytosis  Likely an acute phase reaction. Will continue to monitor   Code Status: Full Code Family Communication:  Disposition Plan:   Molli Posey, MD  Sickle Cell Pager [phone].  If 7PM-7AM, please contact night-coverage at [email or web address], password [facility] 12/10/2012, 10:56 AM   LOS: 1 day   Brief narrative: Dennis Bernard is a 27 y.o. male with a PMH of SSA who presents to the ER with  two day history of pain in his lower back and left leg. Pain is not relieved with usual outpatient pain medications. No associated N/V, F/C. Pain is rated a 10/10 at its worst.  Consultants:  none  Procedures:  none  Antibiotics: none  HPI/Subjective: Patient states that his pain is 10/10. He has got no relief within the full dose PCA. He is hurting all over. Mostly in his lower back and left leg.   Objective: Filed Vitals:   12/10/12 0400 12/10/12 0547 12/10/12 0550 12/10/12 1013  BP:  99/64  118/67  Pulse:  71  73  Temp:  97.6 F (36.4 C)  98.3 F (36.8 C)  TempSrc:  Oral  Oral  Resp: 18 11 14 16   Height:      Weight:      SpO2: 98% 95%  100%    Intake/Output Summary (Last 24 hours) at 12/10/12 1056 Last data filed at 12/10/12 1039  Gross per 24 hour  Intake   1945 ml  Output   1150 ml  Net    795 ml    Exam:   General:  Patient laying in bed. In some  mild distress secondary to pain.   Cardiovascular: Regular rate rhythm   Respiratory: Clear to auscultation bilaterally. No wheezes rhonchi or rales  Extremities: No edema  Data Reviewed: Basic Metabolic Panel:  Recent Labs Lab 12/09/12 1400  NA 135  K 3.9  CL 98  CO2 26  GLUCOSE 90  BUN 9  CREATININE 0.47*  CALCIUM 9.8   L last 168 hours CBC:  Recent Labs Lab 12/09/12 1400  WBC 11.1*  NEUTROABS 7.9*  HGB 9.1*  HCT 26.6*  MCV 99.6  PLT 417*   Studies: Dg Chest 2 View  11/22/2012  *RADIOLOGY REPORT*  Clinical Data: Chest pain, sickle cell disease  CHEST - 2 VIEW  Comparison: 11/10/2012  Findings: Left humeral replacement noted.  Left sided Port-A-Cath in place with tip over the high right atrium.  Heart size is moderately enlarged.  Chronically prominent interstitial lung lesions are noted bilaterally without focal pulmonary opacity. Support apparatus obscure detail over the left lung apex.  No pleural effusion.  IMPRESSION: Stable findings of low lung volumes with crowding of the chronically prominent bronchovascular markings but no new focal pulmonary opacity.   Original Report Authenticated By: Christiana Pellant, M.D.    Dg Chest 2 View  11/10/2012  *RADIOLOGY REPORT*  Clinical Data: Cough and fever  CHEST - 2 VIEW  Comparison:  11/08/2012  Findings: Left chest wall porta-catheter is noted with tip in the cavoatrial junction.  Mild cardiac enlargement is stable from previous exam.  There is pulmonary venous congestion.  No overt edema.  No pleural effusion identified.  There is no airspace consolidation. The skeletal changes of sickle cell disease are noted.  IMPRESSION:  1.  Cardiac enlargement and pulmonary venous congestion. 2.  No focal airspace consolidation identified.   Original Report Authenticated By: Signa Kell, M.D.    Dg Chest Port 1 View  12/09/2012  *RADIOLOGY REPORT*  Clinical Data: Chest pain  PORTABLE CHEST - 1 VIEW  Comparison: 11/22/2012  Findings: The  left chest port is again seen.  The cardiac shadow is stable.  The overall level of inspiratory effort is poor.  Some crowding of vascular markings is again noted although no focal infiltrate is seen.  IMPRESSION: Poor inspiratory effort.  No acute abnormality is noted.   Original Report Authenticated By: Alcide Clever, M.D.     Scheduled Meds: . enoxaparin (LOVENOX) injection  40 mg Subcutaneous Q24H  . folic acid  1 mg Oral Daily  . HYDROmorphone PCA 0.3 mg/mL   Intravenous Q4H  . hydroxyurea  2,000 mg Oral Daily  . ketorolac  30 mg Intravenous Q6H  . morphine  60 mg Oral Q8H  . pantoprazole  40 mg Oral Daily   Continuous Infusions: . sodium chloride 75 mL/hr at 12/10/12 1015     Time Spent:35 min

## 2012-12-11 DIAGNOSIS — D473 Essential (hemorrhagic) thrombocythemia: Secondary | ICD-10-CM

## 2012-12-11 DIAGNOSIS — D571 Sickle-cell disease without crisis: Secondary | ICD-10-CM

## 2012-12-11 LAB — CBC
Platelets: 412 10*3/uL — ABNORMAL HIGH (ref 150–400)
RBC: 2.48 MIL/uL — ABNORMAL LOW (ref 4.22–5.81)
WBC: 5.7 10*3/uL (ref 4.0–10.5)

## 2012-12-11 MED ORDER — KETOROLAC TROMETHAMINE 30 MG/ML IJ SOLN
30.0000 mg | Freq: Four times a day (QID) | INTRAMUSCULAR | Status: DC
  Administered 2012-12-11 – 2012-12-12 (×3): 30 mg via INTRAVENOUS
  Filled 2012-12-11 (×4): qty 1

## 2012-12-11 NOTE — Progress Notes (Signed)
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 Changed patient to customize PCA Dilaudid yesterday.  This is working well for him.  Pain is now 5/10.   Will continue PCA for another 24 hours.  Then possible d/c tomorrow.     Leukocytosis  Most likely secondary to increased bone marrow turnover second to sickle cell crisis Chest x-ray clear with no evidence of pneumonia or acute chest syndrome.   Leukocytosis resolved.  Thrombocytosis  Likely an acute phase reaction. Will continue to monitor; improving.   Code Status: Full Code Family Communication:  Disposition Plan:   Molli Posey, MD  Sickle Cell Pager [phone].  If 7PM-7AM, please contact night-coverage at [email or web address], password [facility] 12/10/2012, 10:56 AM   LOS: 1 day   Brief narrative: Dennis Bernard is a 27 y.o. male with a PMH of SSA who presents to the ER with  two day history of pain in his lower back and left leg. Pain is not relieved with usual outpatient pain medications. No associated N/V, F/C. Pain is rated a 10/10 at its worst.  Consultants:  none  Procedures:  none  Antibiotics: none  HPI/Subjective: Patient states that his pain is 5/10 today.    He would like to go home in the AM so that he can go to school.  Objective: Filed Vitals:   12/10/12 0400 12/10/12 0547 12/10/12 0550 12/10/12 1013  BP:  99/64  118/67  Pulse:  71  73  Temp:  97.6 F (36.4 C)  98.3 F (36.8 C)  TempSrc:  Oral  Oral  Resp: 18 11 14 16   Height:      Weight:      SpO2: 98% 95%  100%    Intake/Output Summary (Last 24 hours) at 12/10/12 1056 Last data filed at 12/10/12 1039  Gross per 24 hour  Intake   1945 ml  Output   1150 ml  Net    795 ml    Exam:   General:  Patient laying in bed. In some mild  distress secondary to pain.   Cardiovascular: Regular rate rhythm   Respiratory: Clear to auscultation bilaterally. No wheezes rhonchi or rales  Extremities: No edema  Data Reviewed: Basic Metabolic Panel:  Recent Labs Lab 12/09/12 1400  NA 135  K 3.9  CL 98  CO2 26  GLUCOSE 90  BUN 9  CREATININE 0.47*  CALCIUM 9.8   L last 168 hours CBC:  Recent Labs Lab 12/09/12 1400  WBC 11.1*  NEUTROABS 7.9*  HGB 9.1*  HCT 26.6*  MCV 99.6  PLT 417*   Studies: Dg Chest 2 View  11/22/2012  *RADIOLOGY REPORT*  Clinical Data: Chest pain, sickle cell disease  CHEST - 2 VIEW  Comparison: 11/10/2012  Findings: Left humeral replacement noted.  Left sided Port-A-Cath in place with tip over the high right atrium.  Heart size is moderately enlarged.  Chronically prominent interstitial lung lesions are noted bilaterally without focal pulmonary opacity. Support apparatus obscure detail over the left lung apex.  No pleural effusion.  IMPRESSION: Stable findings of low lung volumes with crowding of the chronically prominent bronchovascular markings but no new focal pulmonary opacity.   Original Report Authenticated By: Christiana Pellant, M.D.    Dg Chest 2 View  11/10/2012  *RADIOLOGY REPORT*  Clinical Data: Cough and  fever  CHEST - 2 VIEW  Comparison: 11/08/2012  Findings: Left chest wall porta-catheter is noted with tip in the cavoatrial junction.  Mild cardiac enlargement is stable from previous exam.  There is pulmonary venous congestion.  No overt edema.  No pleural effusion identified.  There is no airspace consolidation. The skeletal changes of sickle cell disease are noted.  IMPRESSION:  1.  Cardiac enlargement and pulmonary venous congestion. 2.  No focal airspace consolidation identified.   Original Report Authenticated By: Signa Kell, M.D.    Dg Chest Port 1 View  12/09/2012  *RADIOLOGY REPORT*  Clinical Data: Chest pain  PORTABLE CHEST - 1 VIEW  Comparison: 11/22/2012  Findings: The left  chest port is again seen.  The cardiac shadow is stable.  The overall level of inspiratory effort is poor.  Some crowding of vascular markings is again noted although no focal infiltrate is seen.  IMPRESSION: Poor inspiratory effort.  No acute abnormality is noted.   Original Report Authenticated By: Alcide Clever, M.D.     Scheduled Meds: . enoxaparin (LOVENOX) injection  40 mg Subcutaneous Q24H  . folic acid  1 mg Oral Daily  . HYDROmorphone PCA 0.3 mg/mL   Intravenous Q4H  . hydroxyurea  2,000 mg Oral Daily  . ketorolac  30 mg Intravenous Q6H  . morphine  60 mg Oral Q8H  . pantoprazole  40 mg Oral Daily   Continuous Infusions: . sodium chloride 75 mL/hr at 12/10/12 1015     Time Spent:35 min

## 2012-12-12 LAB — CBC
HCT: 24.5 % — ABNORMAL LOW (ref 39.0–52.0)
Hemoglobin: 8.3 g/dL — ABNORMAL LOW (ref 13.0–17.0)
WBC: 7.5 10*3/uL (ref 4.0–10.5)

## 2012-12-12 MED ORDER — PROMETHAZINE HCL 25 MG PO TABS: 25.0000 mg | ORAL_TABLET | Freq: Four times a day (QID) | ORAL | Status: DC | PRN

## 2012-12-12 MED ORDER — HEPARIN SOD (PORK) LOCK FLUSH 100 UNIT/ML IV SOLN
INTRAVENOUS | Status: AC
  Administered 2012-12-12: 500 [IU]
  Filled 2012-12-12: qty 5

## 2012-12-12 MED ORDER — MORPHINE SULFATE ER 60 MG PO TBCR: 60.0000 mg | EXTENDED_RELEASE_TABLET | Freq: Three times a day (TID) | ORAL | Status: DC

## 2012-12-12 MED ORDER — MORPHINE SULFATE 15 MG PO TABS: 15.0000 mg | ORAL_TABLET | ORAL | Status: DC | PRN

## 2012-12-12 NOTE — Discharge Summary (Signed)
Physician Discharge Summary  Dennis Bernard WUJ:[id number] DOB: 10-17-1985 DOA: 12/09/2012  PCP: Willey Blade, MD  Admit date: 12/09/2012 Discharge date: 12/12/2012  Recommendations for Outpatient Follow-up:   Follow-up Information   Follow up with MATTHEWS,MICHELLE A., MD In 1 week.   Contact information:   [address] [place] [address] [phone]      Discharge Diagnoses:  Principal Problem:   Sickle cell pain crisis Active Problems:   Sickle cell anemia   Leukocytosis   Thrombocytosis   Chronic pain  Discharge Condition:Stable Disposition: Home   Diet recommendation: Regular  Filed Weights   12/09/12 1841 12/11/12 0500 12/12/12 0500  Weight: 77.111 kg (170 lb) 79.334 kg (174 lb 14.4 oz) 78.518 kg (173 lb 1.6 oz)    History of present illness:  Dennis Bernard is an 27 y.o. male with a PMH of SSA who presents with a 2 day history of pain in his lower back and left leg. Pain is not relieved with usual outpatient pain medications. No associated N/V, F/C. Pain is rated a 10/10 at its worst, and is described as sharp. No aggravating factors. Dilaudid PCA typically alleviates the pain.   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 Patient was treated with Dilaudid constant PCA. Over the 48 hours his symptoms improved. At the time of discharge  Patient given prescription for MS Contin 60mg  #90 and MSIR 15 mg #90 and Phenergan 25 mg #30.  Patient also given a note for school.  Leukocytosis  Most likely secondary to increased bone marrow turnover second to sickle cell crisis  Chest x-ray clear with no evidence of pneumonia. Leukocytosis resolved Prior to discharge.  Thrombocytosis  Likely an acute phase reaction. Was still slightly elevated at the time of discharge. That can be monitored outpatient. Patient was told to followup outpatient with Dr.  Ashley Royalty.    Discharge Instructions  Discharge Orders   Future Orders Complete By Expires     Diet general  As directed         Medication List    TAKE these medications       albuterol 108 (90 BASE) MCG/ACT inhaler  Commonly known as:  PROVENTIL HFA;VENTOLIN HFA  Inhale 1 puff into the lungs every 6 (six) hours as needed for wheezing or shortness of breath. wheezing     diphenhydrAMINE 25 MG tablet  Commonly known as:  BENADRYL  Take 25 mg by mouth every 8 (eight) hours as needed. For itching.     folic acid 1 MG tablet  Commonly known as:  FOLVITE  Take 1 tablet (1 mg total) by mouth daily.     hydroxyurea 500 MG capsule  Commonly known as:  HYDREA  Take 4 capsules (2,000 mg total) by mouth daily. May take with food to minimize GI side effects.     morphine 60 MG 12 hr tablet  Commonly known as:  MS CONTIN  Take 1 tablet (60 mg total) by mouth every 8 (eight) hours.     morphine 15 MG tablet  Commonly known as:  MSIR  Take 1 tablet (15 mg total) by mouth every 4 (four) hours as needed for pain (breakthrough pain). Pain     pantoprazole 40 MG tablet  Commonly known as:  PROTONIX  Take 1 tablet (40 mg total) by mouth daily at 12 noon.     promethazine 25 MG  tablet  Commonly known as:  PHENERGAN  Take 1 tablet (25 mg total) by mouth every 6 (six) hours as needed for nausea.     senna-docusate 8.6-50 MG per tablet  Commonly known as:  Senokot-S  Take 1 tablet by mouth daily as needed for constipation.           Follow-up Information   Follow up with MATTHEWS,MICHELLE A., MD In 1 week.   Contact information:   [address] [place] [address] [phone]        The results of significant diagnostics from this hospitalization (including imaging, microbiology, ancillary and laboratory) are listed below for reference.    Significant Diagnostic Studies: Dg Chest 2 View  11/22/2012  *RADIOLOGY REPORT*  Clinical Data: Chest pain, sickle  cell disease  CHEST - 2 VIEW  Comparison: 11/10/2012  Findings: Left humeral replacement noted.  Left sided Port-A-Cath in place with tip over the high right atrium.  Heart size is moderately enlarged.  Chronically prominent interstitial lung lesions are noted bilaterally without focal pulmonary opacity. Support apparatus obscure detail over the left lung apex.  No pleural effusion.  IMPRESSION: Stable findings of low lung volumes with crowding of the chronically prominent bronchovascular markings but no new focal pulmonary opacity.   Original Report Authenticated By: Christiana Pellant, M.D.    Dg Chest Port 1 View  12/09/2012  *RADIOLOGY REPORT*  Clinical Data: Chest pain  PORTABLE CHEST - 1 VIEW  Comparison: 11/22/2012  Findings: The left chest port is again seen.  The cardiac shadow is stable.  The overall level of inspiratory effort is poor.  Some crowding of vascular markings is again noted although no focal infiltrate is seen.  IMPRESSION: Poor inspiratory effort.  No acute abnormality is noted.   Original Report Authenticated By: Alcide Clever, M.D.       Labs: Basic Metabolic Panel:  Recent Labs Lab 12/09/12 1400  NA 135  K 3.9  CL 98  CO2 26  GLUCOSE 90  BUN 9  CREATININE 0.47*  CALCIUM 9.8    Recent Labs Lab 12/09/12 1400 12/11/12 0620 12/12/12 0405  WBC 11.1* 5.7 7.5  NEUTROABS 7.9*  --   --   HGB 9.1* 8.4* 8.3*  HCT 26.6* 24.1* 24.5*  MCV 99.6 97.2 95.7  PLT 417* 412* 431*    Time coordinating discharge:50 min  Signed:  Molli Posey, MD Sickle Cell  12/12/2012, 8:21 AM

## 2012-12-12 NOTE — Progress Notes (Signed)
Discharge instructions and prescriptions given as well as note for work/school.   No questions asked.  Verbalized understanding.  Left via walking with tech to ride waiting.  Dennis Bernard

## 2012-12-23 ENCOUNTER — Encounter (HOSPITAL_COMMUNITY): Payer: Self-pay | Admitting: Emergency Medicine

## 2012-12-23 ENCOUNTER — Emergency Department (HOSPITAL_COMMUNITY): Payer: Medicare Other

## 2012-12-23 ENCOUNTER — Inpatient Hospital Stay (HOSPITAL_COMMUNITY)
Admission: EM | Admit: 2012-12-23 | Discharge: 2012-12-25 | DRG: 811 | Disposition: A | Discharge status: Home / Self Care | Payer: Medicare Other | Attending: Internal Medicine | Admitting: Internal Medicine

## 2012-12-23 DIAGNOSIS — D5701 Hb-SS disease with acute chest syndrome: Secondary | ICD-10-CM

## 2012-12-23 DIAGNOSIS — D5700 Hb-SS disease with crisis, unspecified: Secondary | ICD-10-CM | POA: Diagnosis present

## 2012-12-23 DIAGNOSIS — Z832 Family history of diseases of the blood and blood-forming organs and certain disorders involving the immune mechanism: Secondary | ICD-10-CM

## 2012-12-23 DIAGNOSIS — J189 Pneumonia, unspecified organism: Secondary | ICD-10-CM

## 2012-12-23 DIAGNOSIS — R894 Abnormal immunological findings in specimens from other organs, systems and tissues: Secondary | ICD-10-CM

## 2012-12-23 DIAGNOSIS — D571 Sickle-cell disease without crisis: Secondary | ICD-10-CM

## 2012-12-23 DIAGNOSIS — M545 Low back pain: Secondary | ICD-10-CM

## 2012-12-23 DIAGNOSIS — Z79899 Other long term (current) drug therapy: Secondary | ICD-10-CM

## 2012-12-23 DIAGNOSIS — D72829 Elevated white blood cell count, unspecified: Secondary | ICD-10-CM

## 2012-12-23 DIAGNOSIS — D473 Essential (hemorrhagic) thrombocythemia: Secondary | ICD-10-CM

## 2012-12-23 DIAGNOSIS — E876 Hypokalemia: Secondary | ICD-10-CM

## 2012-12-23 DIAGNOSIS — R079 Chest pain, unspecified: Secondary | ICD-10-CM

## 2012-12-23 DIAGNOSIS — M549 Dorsalgia, unspecified: Secondary | ICD-10-CM

## 2012-12-23 DIAGNOSIS — G8929 Other chronic pain: Secondary | ICD-10-CM

## 2012-12-23 DIAGNOSIS — G894 Chronic pain syndrome: Secondary | ICD-10-CM | POA: Diagnosis present

## 2012-12-23 LAB — CBC
HCT: 24.2 % — ABNORMAL LOW (ref 39.0–52.0)
Hemoglobin: 8.5 g/dL — ABNORMAL LOW (ref 13.0–17.0)
MCHC: 35.1 g/dL (ref 30.0–36.0)
MCV: 96.4 fL (ref 78.0–100.0)
Platelets: 338 10*3/uL (ref 150–400)
RBC: 2.71 MIL/uL — ABNORMAL LOW (ref 4.22–5.81)
RDW: 17.2 % — ABNORMAL HIGH (ref 11.5–15.5)
RDW: 17.1 % — ABNORMAL HIGH (ref 11.5–15.5)
WBC: 7.7 10*3/uL (ref 4.0–10.5)

## 2012-12-23 LAB — BASIC METABOLIC PANEL
CO2: 26 mEq/L (ref 19–32)
Calcium: 9.6 mg/dL (ref 8.4–10.5)
Chloride: 104 mEq/L (ref 96–112)
Creatinine, Ser: 0.64 mg/dL (ref 0.50–1.35)
GFR calc Af Amer: >90 mL/min (ref >90)
Sodium: 138 mEq/L (ref 135–145)

## 2012-12-23 LAB — POCT I-STAT TROPONIN I: Troponin i, poc: 0 ng/mL (ref 0.00–0.08)

## 2012-12-23 LAB — HEPATIC FUNCTION PANEL
AST: 26 U/L (ref 0–37)
Bilirubin, Direct: 0.3 mg/dL (ref 0.0–0.3)
Total Bilirubin: 1.3 mg/dL — ABNORMAL HIGH (ref 0.3–1.2)

## 2012-12-23 MED ORDER — ONDANSETRON HCL 4 MG/2ML IJ SOLN: 4.0000 mg | Freq: Four times a day (QID) | INTRAMUSCULAR | Status: DC | PRN

## 2012-12-23 MED ORDER — MORPHINE SULFATE 15 MG PO TABS
15.0000 mg | ORAL_TABLET | ORAL | Status: DC | PRN
  Administered 2012-12-23 – 2012-12-24 (×2): 15 mg via ORAL
  Filled 2012-12-23 (×2): qty 1

## 2012-12-23 MED ORDER — DEXTROSE 5 % IV SOLN
500.0000 mg | INTRAVENOUS | Status: DC
  Administered 2012-12-24: 500 mg via INTRAVENOUS
  Filled 2012-12-23: qty 500

## 2012-12-23 MED ORDER — HYDROMORPHONE HCL PF 1 MG/ML IJ SOLN
2.0000 mg | INTRAMUSCULAR | Status: AC | PRN
  Administered 2012-12-23: 2 mg via INTRAVENOUS
  Filled 2012-12-23: qty 2

## 2012-12-23 MED ORDER — NALOXONE HCL 0.4 MG/ML IJ SOLN: 0.4000 mg | INTRAMUSCULAR | Status: DC | PRN

## 2012-12-23 MED ORDER — DIPHENHYDRAMINE HCL 12.5 MG/5ML PO ELIX
12.5000 mg | ORAL_SOLUTION | Freq: Four times a day (QID) | ORAL | Status: DC | PRN
  Filled 2012-12-23: qty 5

## 2012-12-23 MED ORDER — HYDROMORPHONE HCL PF 2 MG/ML IJ SOLN: 3.0000 mg | INTRAMUSCULAR | Status: DC | PRN

## 2012-12-23 MED ORDER — PROMETHAZINE HCL 25 MG/ML IJ SOLN
25.0000 mg | Freq: Once | INTRAMUSCULAR | Status: AC
  Administered 2012-12-24: 25 mg via INTRAVENOUS
  Filled 2012-12-23: qty 1

## 2012-12-23 MED ORDER — PANTOPRAZOLE SODIUM 40 MG PO TBEC
40.0000 mg | DELAYED_RELEASE_TABLET | Freq: Every day | ORAL | Status: DC
  Administered 2012-12-23 – 2012-12-25 (×3): 40 mg via ORAL
  Filled 2012-12-23 (×2): qty 1

## 2012-12-23 MED ORDER — PROMETHAZINE HCL 25 MG PO TABS
25.0000 mg | ORAL_TABLET | Freq: Four times a day (QID) | ORAL | Status: DC | PRN
  Administered 2012-12-24: 25 mg via ORAL
  Filled 2012-12-23: qty 1

## 2012-12-23 MED ORDER — HYDROMORPHONE HCL PF 2 MG/ML IJ SOLN
2.0000 mg | INTRAMUSCULAR | Status: DC | PRN
  Filled 2012-12-23 (×2): qty 1

## 2012-12-23 MED ORDER — PROMETHAZINE HCL 25 MG/ML IJ SOLN
25.0000 mg | INTRAMUSCULAR | Status: DC | PRN
  Administered 2012-12-23 – 2012-12-24 (×3): 25 mg via INTRAVENOUS
  Filled 2012-12-23 (×4): qty 1

## 2012-12-23 MED ORDER — ALBUTEROL SULFATE (5 MG/ML) 0.5% IN NEBU: 2.5000 mg | INHALATION_SOLUTION | RESPIRATORY_TRACT | Status: DC | PRN

## 2012-12-23 MED ORDER — DIPHENHYDRAMINE HCL 25 MG PO TABS
25.0000 mg | ORAL_TABLET | Freq: Three times a day (TID) | ORAL | Status: DC | PRN
  Filled 2012-12-23: qty 1

## 2012-12-23 MED ORDER — DEXTROSE 5 % IV SOLN
500.0000 mg | Freq: Once | INTRAVENOUS | Status: AC
  Administered 2012-12-23: 500 mg via INTRAVENOUS
  Filled 2012-12-23: qty 500

## 2012-12-23 MED ORDER — ALBUTEROL SULFATE (5 MG/ML) 0.5% IN NEBU
2.5000 mg | INHALATION_SOLUTION | Freq: Four times a day (QID) | RESPIRATORY_TRACT | Status: DC
  Administered 2012-12-23: 2.5 mg via RESPIRATORY_TRACT
  Filled 2012-12-23: qty 0.5

## 2012-12-23 MED ORDER — DEXTROSE 5 % IV SOLN
1.0000 g | INTRAVENOUS | Status: DC
  Administered 2012-12-23: 1 g via INTRAVENOUS
  Filled 2012-12-23 (×2): qty 10

## 2012-12-23 MED ORDER — HYDROXYUREA 500 MG PO CAPS
2000.0000 mg | ORAL_CAPSULE | Freq: Every day | ORAL | Status: DC
  Administered 2012-12-23 – 2012-12-25 (×2): 2000 mg via ORAL
  Filled 2012-12-23 (×3): qty 4

## 2012-12-23 MED ORDER — HYDROMORPHONE 0.3 MG/ML IV SOLN
INTRAVENOUS | Status: DC
  Administered 2012-12-23: 20:00:00 via INTRAVENOUS
  Administered 2012-12-24: 0.3 mg via INTRAVENOUS
  Administered 2012-12-24: 5 mL via INTRAVENOUS
  Administered 2012-12-24: 10 mL via INTRAVENOUS
  Filled 2012-12-23 (×2): qty 25

## 2012-12-23 MED ORDER — SODIUM CHLORIDE 0.9 % IJ SOLN
10.0000 mL | INTRAMUSCULAR | Status: DC | PRN
  Administered 2012-12-24 – 2012-12-25 (×2): 10 mL

## 2012-12-23 MED ORDER — HYDROMORPHONE HCL PF 2 MG/ML IJ SOLN
4.0000 mg | INTRAMUSCULAR | Status: DC | PRN
  Administered 2012-12-23: 4 mg via INTRAVENOUS
  Filled 2012-12-23: qty 2

## 2012-12-23 MED ORDER — HYDROMORPHONE HCL PF 2 MG/ML IJ SOLN
2.0000 mg | Freq: Once | INTRAMUSCULAR | Status: AC
  Administered 2012-12-23: 2 mg via INTRAVENOUS
  Filled 2012-12-23: qty 1

## 2012-12-23 MED ORDER — DEXTROSE-NACL 5-0.45 % IV SOLN
INTRAVENOUS | Status: DC
  Administered 2012-12-23 – 2012-12-25 (×4): via INTRAVENOUS

## 2012-12-23 MED ORDER — SENNOSIDES-DOCUSATE SODIUM 8.6-50 MG PO TABS
1.0000 | ORAL_TABLET | Freq: Every day | ORAL | Status: DC | PRN
  Filled 2012-12-23: qty 1

## 2012-12-23 MED ORDER — SODIUM CHLORIDE 0.9 % IJ SOLN: 9.0000 mL | INTRAMUSCULAR | Status: DC | PRN

## 2012-12-23 MED ORDER — SODIUM CHLORIDE 0.9 % IV SOLN: INTRAVENOUS | Status: AC

## 2012-12-23 MED ORDER — DIPHENHYDRAMINE HCL 25 MG PO CAPS
25.0000 mg | ORAL_CAPSULE | Freq: Once | ORAL | Status: AC
  Administered 2012-12-23: 25 mg via ORAL
  Filled 2012-12-23: qty 1

## 2012-12-23 MED ORDER — ALBUTEROL SULFATE (5 MG/ML) 0.5% IN NEBU
2.5000 mg | INHALATION_SOLUTION | Freq: Two times a day (BID) | RESPIRATORY_TRACT | Status: DC
  Administered 2012-12-24 – 2012-12-25 (×3): 2.5 mg via RESPIRATORY_TRACT
  Filled 2012-12-23 (×3): qty 0.5

## 2012-12-23 MED ORDER — FOLIC ACID 1 MG PO TABS
1.0000 mg | ORAL_TABLET | Freq: Every day | ORAL | Status: DC
  Administered 2012-12-23 – 2012-12-25 (×3): 1 mg via ORAL
  Filled 2012-12-23 (×3): qty 1

## 2012-12-23 MED ORDER — HYDROMORPHONE HCL PF 2 MG/ML IJ SOLN
4.0000 mg | Freq: Once | INTRAMUSCULAR | Status: AC
  Administered 2012-12-23: 4 mg via INTRAVENOUS

## 2012-12-23 MED ORDER — ONDANSETRON HCL 4 MG/2ML IJ SOLN: 4.0000 mg | Freq: Three times a day (TID) | INTRAMUSCULAR | Status: DC | PRN

## 2012-12-23 MED ORDER — KETOROLAC TROMETHAMINE 15 MG/ML IJ SOLN
15.0000 mg | Freq: Four times a day (QID) | INTRAMUSCULAR | Status: DC
  Administered 2012-12-23 – 2012-12-25 (×8): 15 mg via INTRAVENOUS
  Filled 2012-12-23 (×11): qty 1

## 2012-12-23 MED ORDER — DIPHENHYDRAMINE HCL 50 MG/ML IJ SOLN: 12.5000 mg | Freq: Four times a day (QID) | INTRAMUSCULAR | Status: DC | PRN

## 2012-12-23 MED ORDER — HYDROMORPHONE HCL PF 2 MG/ML IJ SOLN
4.0000 mg | Freq: Once | INTRAMUSCULAR | Status: AC
  Administered 2012-12-23: 4 mg via INTRAVENOUS
  Filled 2012-12-23: qty 2

## 2012-12-23 MED ORDER — ENOXAPARIN SODIUM 40 MG/0.4ML ~~LOC~~ SOLN
40.0000 mg | SUBCUTANEOUS | Status: DC
  Administered 2012-12-23 – 2012-12-24 (×2): 40 mg via SUBCUTANEOUS
  Filled 2012-12-23 (×3): qty 0.4

## 2012-12-23 MED ORDER — HYDROXYUREA 500 MG PO CAPS
2000.0000 mg | ORAL_CAPSULE | Freq: Every day | ORAL | Status: DC
  Filled 2012-12-23: qty 4

## 2012-12-23 MED ORDER — DIPHENHYDRAMINE HCL 50 MG/ML IJ SOLN
50.0000 mg | Freq: Once | INTRAMUSCULAR | Status: AC
  Administered 2012-12-23: 50 mg via INTRAVENOUS
  Filled 2012-12-23: qty 1

## 2012-12-23 MED ORDER — PROMETHAZINE HCL 25 MG/ML IJ SOLN
25.0000 mg | Freq: Once | INTRAMUSCULAR | Status: AC
  Administered 2012-12-23: 25 mg via INTRAVENOUS
  Filled 2012-12-23: qty 1

## 2012-12-23 NOTE — ED Notes (Signed)
Onset 2 days ago chest pain with shortness of breath and lower back pain. States history of sickle cell crisis and feels like he is currently having a crisis now.

## 2012-12-23 NOTE — ED Notes (Signed)
Reg tray ordered 

## 2012-12-23 NOTE — ED Provider Notes (Signed)
  Medical screening examination/treatment/procedure(s) were performed by non-physician practitioner and as supervising physician I was immediately available for consultation/collaboration.    Gerhard Munch, MD 12/23/12 [phone]

## 2012-12-23 NOTE — H&P (Signed)
History and Physical       Hospital Admission Note Date: 12/23/2012  Patient name: Dennis Bernard Medical record number: [id number] Date of birth: 04-14-1986 Age: 27 y.o. Gender: male PCP: DEAN, [name], MD    Chief Complaint:  Chest pain with sickle cell crisis  HPI: Patient is a 26 year old male with past medical history of sickle cell anemia and admissions for sickle cell crisis presented with low back and chest pain started 2 days ago. Patient stated that the chest pain is midsternal without radiation typical of his sickle cell crisis however he also had shortness of breath this time. He complained of dry cough, nausea, 10/10 pain in the chest, lower back and right hip. Patient also reports that Dilaudid PCA typically in the first 48 hours alleviates the pain.   Review of Systems:  Constitutional: Denies fever, chills, diaphoresis, + poor appetite and fatigue.  HEENT: Denies photophobia, eye pain, redness, hearing loss, ear pain, congestion, sore throat, rhinorrhea, sneezing, mouth sores, trouble swallowing, neck pain, neck stiffness and tinnitus.   Respiratory: Denies any significant wheezing, please see history of present illness.   Cardiovascular: Denies  palpitations and leg swelling.  Gastrointestinal: Denies vomiting, abdominal pain, diarrhea, constipation, blood in stool and abdominal distention, nausea.  Genitourinary: Denies dysuria, urgency, frequency, hematuria, flank pain and difficulty urinating.  Musculoskeletal: +Back pain  with myalgias and arthralgias Skin: Denies pallor, rash and wound.  Neurological: Denies dizziness, seizures, syncope, weakness, light-headedness, numbness and headaches.  Hematological: Denies adenopathy. Easy bruising, personal or family bleeding history  Psychiatric/Behavioral: Denies suicidal ideation, mood changes, confusion, nervousness, sleep disturbance and agitation  Past Medical  History: Past Medical History  Diagnosis Date  . Sickle cell disease   . Avascular necrosis of femur head, right   . H/O allergic rhinitis   . H/O hypokalemia   . Chronic pain syndrome   . H/O wheezing     with colds   Past Surgical History  Procedure Laterality Date  . Cholecystectomy  1995  . Left shoulder arthroplasty  04/09/2008  . Left pac placement  07/04/2012  . Exchange transfusion  02/2008    perioperatively for shoulder surgery    Medications: Prior to Admission medications   Medication Sig Start Date End Date Taking? Authorizing Provider  albuterol (PROVENTIL HFA;VENTOLIN HFA) 108 (90 BASE) MCG/ACT inhaler Inhale 1 puff into the lungs every 6 (six) hours as needed for wheezing or shortness of breath. wheezing 11/27/12  Yes Vassie Loll, MD  diphenhydrAMINE (BENADRYL) 25 MG tablet Take 25 mg by mouth every 8 (eight) hours as needed. For itching. 08/23/12  Yes Dow Adolph, MD  folic acid (FOLVITE) 1 MG tablet Take 1 tablet (1 mg total) by mouth daily. 11/27/12  Yes Vassie Loll, MD  hydroxyurea (HYDREA) 500 MG capsule Take 4 capsules (2,000 mg total) by mouth daily. May take with food to minimize GI side effects. 11/27/12  Yes Vassie Loll, MD  morphine (MS CONTIN) 60 MG 12 hr tablet Take 1 tablet (60 mg total) by mouth every 8 (eight) hours. 12/12/12  Yes Novlet Adelina Mings, MD  morphine (MSIR) 15 MG tablet Take 1 tablet (15 mg total) by mouth every 4 (four) hours as needed for pain (breakthrough pain). Pain 12/12/12  Yes Novlet Adelina Mings, MD  pantoprazole (PROTONIX) 40 MG tablet Take 1 tablet (40 mg total) by mouth daily at 12 noon. 11/27/12  Yes Vassie Loll, MD  promethazine (PHENERGAN) 25 MG tablet Take 1 tablet (25 mg total) by mouth  every 6 (six) hours as needed for nausea. 12/12/12  Yes Novlet Adelina Mings, MD  senna-docusate (SENOKOT-S) 8.6-50 MG per tablet Take 1 tablet by mouth daily as needed for constipation.  08/27/12  Yes Linward Headland, MD    Allergies:  No Known  Allergies  Social History:  reports that he has never smoked. He has never used smokeless tobacco. He reports that he does not drink alcohol or use illicit drugs.  Family History: Family History  Problem Relation Age of Onset  . Sickle cell anemia Brother   . Sickle cell trait Father   . Sickle cell trait Mother   . Diabetes Mellitus II Mother   . Sickle cell anemia Paternal Uncle   . Asthma Neg Hx   . Allergic rhinitis Neg Hx     Physical Exam: Blood pressure 102/64, pulse 83, temperature 98.7 F (37.1 C), temperature source Oral, resp. rate 20, SpO2 99.00%. General: Alert, awake, oriented x3, in no acute distress. HEENT: normocephalic, atraumatic, anicteric sclera, pink conjunctiva, pupils equal and reactive to light and accomodation, oropharynx clear Neck: supple, no masses or lymphadenopathy, no goiter, no bruits  Heart: Regular rate and rhythm, without murmurs, rubs or gallops. Lungs: Clear to auscultation bilaterally, no wheezing, rales or rhonchi. Abdomen: Soft, nontender, nondistended, positive bowel sounds, no masses. Extremities: No clubbing, cyanosis or edema with positive pedal pulses. Neuro: Grossly intact, no focal neurological deficits, strength 5/5 upper and lower extremities bilaterally Psych: alert and oriented x 3, normal mood and affect Skin: no rashes or lesions, warm and dry   LABS on Admission:  Basic Metabolic Panel:  Recent Labs Lab 12/23/12 1230  NA 138  K 4.1  CL 104  CO2 26  GLUCOSE 90  BUN 12  CREATININE 0.64  CALCIUM 9.6   Liver Function Tests:  Recent Labs Lab 12/23/12 1230  AST 26  ALT 24  ALKPHOS 101  BILITOT 1.3*  PROT 7.6  ALBUMIN 4.5   No results found for this basename: LIPASE, AMYLASE,  in the last 168 hours No results found for this basename: AMMONIA,  in the last 168 hours CBC:  Recent Labs Lab 12/23/12 1230  WBC 7.7  HGB 8.9*  HCT 26.1*  MCV 96.3  PLT 338   Cardiac Enzymes: No results found for this  basename: CKTOTAL, CKMB, CKMBINDEX, TROPONINI,  in the last 168 hours BNP: No components found with this basename: POCBNP,  CBG: No results found for this basename: GLUCAP,  in the last 168 hours   Radiological Exams on Admission: Dg Chest 2 View  12/23/2012  *RADIOLOGY REPORT*  Clinical Data: Chest pain, sickle cell pain crisis  CHEST - 2 VIEW  Comparison: Prior chest x-ray 12/09/2012  Findings: Left IJ approach dual lumen portacatheter with the tip at the superior cavoatrial junction.  Inspiratory volumes are low. Mild patchy opacity in the right base seen on the frontal view. Cardiac and mediastinal contours within normal limits.  No pleural effusion or pneumothorax.  Surgical changes of prior left shoulder arthroplasty.  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  Characteristic vertebral body endplate changes consistent with the patient's clinical history of sickle cell anemia.  IMPRESSION:  1.  Patchy right basilar opacity may reflect atelectasis in the setting of low lung volumes, or infiltrate in the setting of sickle cell pain crisis. 2.  Stable position of left IJ approach portacatheter. 3.  Stigmata of sickle cell disease including right humeral head avascular necrosis and characteristic  vertebral endplate changes.   Original Report Authenticated By: Malachy Moan, M.D.    Dg Chest Port 1 View  12/09/2012  *RADIOLOGY REPORT*  Clinical Data: Chest pain  PORTABLE CHEST - 1 VIEW  Comparison: 11/22/2012  Findings: The left chest port is again seen.  The cardiac shadow is stable.  The overall level of inspiratory effort is poor.  Some crowding of vascular markings is again noted although no focal infiltrate is seen.  IMPRESSION: Poor inspiratory effort.  No acute abnormality is noted.   Original Report Authenticated By: Alcide Clever, M.D.     Assessment/Plan Principal Problem:   Sickle cell pain crisis with chest pain with sickle cell anemia - Will admit  patient and attempt pain control with Dilaudid PCA and MSIR for breakthrough pain, Toradol IV every 6 hours for anti-inflammatory effect.   - Continue IV fluid hydration, O2  - Hemoglobin is still stable and in the baseline range, no current indication for blood transfusion - Continue home dose of Hydrea and folic acid  Active Problems:    Chest pain - Continue management as above, chest x-ray shows a right basilar opacity - Will place on albuterol breathing treatments, and Zithromax and Rocephin IV, O2, incentive spirometry  DVT prophylaxis: Lovenox  CODE STATUS: Full CODE STATUS  Further plan will depend as patient's clinical course evolves and further radiologic and laboratory data become available.   Time Spent on Admission: 1 hour  RAI,RIPUDEEP M.D. [facility] 12/23/2012, 4:43 PM Pager: [phone]  If 7PM-7AM, please contact night-coverage [email or web address] Password [email or web address]

## 2012-12-23 NOTE — ED Provider Notes (Signed)
History     CSN: [id number]  Arrival date & time 12/23/12  4098   First MD Initiated Contact with Patient 12/23/12 1027      Chief Complaint  Patient presents with  . Chest Pain  . Sickle Cell Pain Crisis    (Consider location/radiation/quality/duration/timing/severity/associated sxs/prior treatment) HPI  Patient is a 27 year old male past medical history significant for sickle cell presenting to ED for sickle cell crisis, low back and chest pain that started 2 days ago. Central chest pain without radiation, associated shortness of breath  Without wheezing and some nausea without emesis. Back pain is in lumbar region without radiation down the legs or up the spine. Chest and back pain 10/10 currently. Pain waxes and wanes with sharp shooting and dull achy pain. Patient states feels like typical sickle cell crisis patient states his typical sickle cell crisis cocktail is Dilaudid, Phenergan, Benadryl. Denies fevers, chills, vomiting, abdominal pain, urinary symptoms, cough. Denies recent sick contacts.   Past Medical History  Diagnosis Date  . Sickle cell disease   . Avascular necrosis of femur head, right   . H/O allergic rhinitis   . H/O hypokalemia   . Chronic pain syndrome   . H/O wheezing     with colds    Past Surgical History  Procedure Laterality Date  . Cholecystectomy  1995  . Left shoulder arthroplasty  04/09/2008  . Left pac placement  07/04/2012  . Exchange transfusion  02/2008    perioperatively for shoulder surgery    Family History  Problem Relation Age of Onset  . Sickle cell anemia Brother   . Sickle cell trait Father   . Sickle cell trait Mother   . Diabetes Mellitus II Mother   . Sickle cell anemia Paternal Uncle   . Asthma Neg Hx   . Allergic rhinitis Neg Hx     History  Substance Use Topics  . Smoking status: Never Smoker   . Smokeless tobacco: Never Used  . Alcohol Use: No      Review of Systems  Constitutional: Positive for fatigue.   HENT: Negative for neck pain.   Eyes: Negative for visual disturbance.  Respiratory: Positive for cough and shortness of breath.   Cardiovascular: Positive for chest pain. Negative for palpitations and leg swelling.  Gastrointestinal: Positive for nausea. Negative for vomiting, abdominal pain and blood in stool.  Genitourinary: Negative for dysuria, hematuria and difficulty urinating.  Musculoskeletal: Positive for back pain.  Skin: Negative.   Neurological: Negative for weakness and headaches.    Allergies  Review of patient's allergies indicates no known allergies.  Home Medications   Current Outpatient Rx  Name  Route  Sig  Dispense  Refill  . albuterol (PROVENTIL HFA;VENTOLIN HFA) 108 (90 BASE) MCG/ACT inhaler   Inhalation   Inhale 1 puff into the lungs every 6 (six) hours as needed for wheezing or shortness of breath. wheezing   1 Inhaler   1   . diphenhydrAMINE (BENADRYL) 25 MG tablet   Oral   Take 25 mg by mouth every 8 (eight) hours as needed. For itching.         . folic acid (FOLVITE) 1 MG tablet   Oral   Take 1 tablet (1 mg total) by mouth daily.   30 tablet   1   . hydroxyurea (HYDREA) 500 MG capsule   Oral   Take 4 capsules (2,000 mg total) by mouth daily. May take with food to minimize GI side effects.  120 capsule   1   . morphine (MS CONTIN) 60 MG 12 hr tablet   Oral   Take 1 tablet (60 mg total) by mouth every 8 (eight) hours.   90 tablet   0   . morphine (MSIR) 15 MG tablet   Oral   Take 1 tablet (15 mg total) by mouth every 4 (four) hours as needed for pain (breakthrough pain). Pain   90 tablet   0   . pantoprazole (PROTONIX) 40 MG tablet   Oral   Take 1 tablet (40 mg total) by mouth daily at 12 noon.   30 tablet   1   . promethazine (PHENERGAN) 25 MG tablet   Oral   Take 1 tablet (25 mg total) by mouth every 6 (six) hours as needed for nausea.   30 tablet   0   . senna-docusate (SENOKOT-S) 8.6-50 MG per tablet   Oral   Take 1  tablet by mouth daily as needed for constipation.            BP 116/70  Pulse 98  Temp(Src) 98.7 F (37.1 C) (Oral)  Resp 16  SpO2 100%  Physical Exam  Constitutional: He is oriented to person, place, and time. He appears well-developed and well-nourished. No distress.  HENT:  Head: Normocephalic and atraumatic.  Eyes: Conjunctivae are normal.  Neck: Neck supple.  Cardiovascular: Normal rate, regular rhythm, normal heart sounds and intact distal pulses.   Pulmonary/Chest: Effort normal. No accessory muscle usage. No respiratory distress. He has wheezes in the right lower field and the left lower field.  Abdominal: Soft. There is no tenderness.  Neurological: He is alert and oriented to person, place, and time.  Skin: Skin is warm and dry. He is not diaphoretic.    ED Course  Procedures (including critical care time)  IV team was called for port access for lab draws and pain management.   Medications  promethazine (PHENERGAN) injection 25 mg (not administered)  HYDROmorphone (DILAUDID) injection 4 mg (not administered)  HYDROmorphone (DILAUDID) injection 4 mg (not administered)  promethazine (PHENERGAN) injection 25 mg (not administered)  azithromycin (ZITHROMAX) 500 mg in dextrose 5 % 250 mL IVPB (not administered)  promethazine (PHENERGAN) injection 25 mg (25 mg Intravenous Given 12/23/12 1224)  diphenhydrAMINE (BENADRYL) injection 50 mg (50 mg Intravenous Given 12/23/12 1224)  HYDROmorphone (DILAUDID) injection 2 mg (2 mg Intravenous Given 12/23/12 1225)  HYDROmorphone (DILAUDID) injection 4 mg (4 mg Intravenous Given 12/23/12 1344)    EKG ordered and acknowledged still no EKG results at time of shift change and consultation with hospitalist for admission.   Labs Reviewed  CBC - Abnormal; Notable for the following:    RBC 2.71 (*)    Hemoglobin 8.9 (*)    HCT 26.1 (*)    RDW 17.2 (*)    All other components within normal limits  HEPATIC FUNCTION PANEL - Abnormal;  Notable for the following:    Total Bilirubin 1.3 (*)    Indirect Bilirubin 1.0 (*)    All other components within normal limits  BASIC METABOLIC PANEL  POCT I-STAT TROPONIN I   Dg Chest 2 View  12/23/2012  *RADIOLOGY REPORT*  Clinical Data: Chest pain, sickle cell pain crisis  CHEST - 2 VIEW  Comparison: Prior chest x-ray 12/09/2012  Findings: Left IJ approach dual lumen portacatheter with the tip at the superior cavoatrial junction.  Inspiratory volumes are low. Mild patchy opacity in the right base seen on the frontal  view. Cardiac and mediastinal contours within normal limits.  No pleural effusion or pneumothorax.  Surgical changes of prior left shoulder arthroplasty.  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  Characteristic vertebral body endplate changes consistent with the patient's clinical history of sickle cell anemia.  IMPRESSION:  1.  Patchy right basilar opacity may reflect atelectasis in the setting of low lung volumes, or infiltrate in the setting of sickle cell pain crisis. 2.  Stable position of left IJ approach portacatheter. 3.  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   Original Report Authenticated By: Malachy Moan, M.D.     Patient notified of chest x-ray findings, patient states that he has previously been told about the right humeral head avascular necrosis and this is not a new finding for him. Patient started on Zithromax and for possible lung infection noted on chest x-ray.  Pain is still not managed after 3 rounds of IV Dilaudid. Hospital is consulted for admission for pain management.   1. Sickle cell pain crisis       MDM  This is a 27 year old male past medical history significant for sickle cell anemia presenting to the ED for chest pain and sickle cell crisis that began 2 days ago with worsening symptoms today. Centralized waxing and waning  chest pain with sharp shooting to 10 pain no radiation with associated shortness of breath. Physical examination patient is not in any respiratory distress, nondiaphoretic, nontoxic appearing. Cardiac xamination was normal, some wheezes noted bilateral lower bases. Other results stable patient. No signs of acute coronary syndrome on x-ray, neg trop. There is some questionable signs of opacity noted in lung field on CXR. Patient started on azithromycin for possible infection. Pain is unremitting after 3 rounds of Dilaudid, Phenergan, Benadryl. Hospitalist consulted for admission for pain management control. He will be admitted for pain control. Patient case discussed with Dr. Jeraldine Loots agrees with plan. Patient stable at time of transport.      Jeannetta Ellis, PA-C 12/23/12 1621

## 2012-12-23 NOTE — ED Notes (Signed)
IV team paged to access porta cath and draw labs

## 2012-12-24 ENCOUNTER — Inpatient Hospital Stay (HOSPITAL_COMMUNITY): Payer: Medicare Other

## 2012-12-24 DIAGNOSIS — J189 Pneumonia, unspecified organism: Secondary | ICD-10-CM

## 2012-12-24 DIAGNOSIS — D5701 Hb-SS disease with acute chest syndrome: Secondary | ICD-10-CM

## 2012-12-24 LAB — COMPREHENSIVE METABOLIC PANEL
ALT: 13 U/L (ref 0–53)
Alkaline Phosphatase: 87 U/L (ref 39–117)
BUN: 13 mg/dL (ref 6–23)
CO2: 25 mEq/L (ref 19–32)
Chloride: 103 mEq/L (ref 96–112)
GFR calc Af Amer: >90 mL/min (ref >90)
GFR calc non Af Amer: >90 mL/min (ref >90)
Glucose, Bld: 105 mg/dL — ABNORMAL HIGH (ref 70–99)
Potassium: 3.7 mEq/L (ref 3.5–5.1)
Total Bilirubin: 0.8 mg/dL (ref 0.3–1.2)

## 2012-12-24 LAB — MAGNESIUM: Magnesium: 2.3 mg/dL (ref 1.5–2.5)

## 2012-12-24 MED ORDER — HYDROMORPHONE 0.3 MG/ML IV SOLN
INTRAVENOUS | Status: DC
  Administered 2012-12-24: 19:00:00 via INTRAVENOUS
  Administered 2012-12-24: 9.29 mg via INTRAVENOUS
  Administered 2012-12-24: 21:00:00 via INTRAVENOUS
  Administered 2012-12-25: 7.47 mg via INTRAVENOUS
  Administered 2012-12-25: 10.64 mg via INTRAVENOUS
  Administered 2012-12-25: 09:00:00 via INTRAVENOUS
  Filled 2012-12-24 (×5): qty 25

## 2012-12-24 MED ORDER — MORPHINE SULFATE ER 30 MG PO TBCR
60.0000 mg | EXTENDED_RELEASE_TABLET | Freq: Three times a day (TID) | ORAL | Status: DC
  Administered 2012-12-24 – 2012-12-25 (×3): 60 mg via ORAL
  Filled 2012-12-24 (×3): qty 2

## 2012-12-24 MED ORDER — VANCOMYCIN HCL IN DEXTROSE 1-5 GM/200ML-% IV SOLN
1000.0000 mg | Freq: Three times a day (TID) | INTRAVENOUS | Status: DC
  Administered 2012-12-24 – 2012-12-25 (×3): 1000 mg via INTRAVENOUS
  Filled 2012-12-24 (×5): qty 200

## 2012-12-24 MED ORDER — DEXTROSE 5 % IV SOLN
1.0000 g | Freq: Three times a day (TID) | INTRAVENOUS | Status: DC
  Administered 2012-12-24 – 2012-12-25 (×3): 1 g via INTRAVENOUS
  Filled 2012-12-24 (×5): qty 1

## 2012-12-24 NOTE — Progress Notes (Signed)
ANTIBIOTIC CONSULT NOTE - INITIAL  Pharmacy Consult for vancomycin/cefepime Indication: rule out pneumonia  No Known Allergies  Patient Measurements: Height: 5\' 9"  (175.3 cm) Weight: 173 lb 1 oz (78.5 kg) IBW/kg (Calculated) : 70.7   Vital Signs: Temp: 98.2 F (36.8 C) (04/12 1325) Temp src: Oral (04/12 0456) BP: 105/62 mmHg (04/12 1325) Pulse Rate: 88 (04/12 1325) Intake/Output from previous day: 04/11 0701 - 04/12 0700 In: 1443.3 [P.O.:360; I.V.:1033.3; IV Piggyback:50] Out: 550 [Urine:550] Intake/Output from this shift: Total I/O In: 320 [P.O.:320] Out: 1600 [Urine:1600]  Labs:  Recent Labs  12/23/12 1230 12/23/12 2000 12/24/12 0500  WBC 7.7 10.4  --   HGB 8.9* 8.5*  --   PLT 338 329  --   CREATININE 0.64 0.69 0.75   Estimated Creatinine Clearance: 137.5 ml/min (by C-G formula based on Cr of 0.75). No results found for this basename: VANCOTROUGH, VANCOPEAK, VANCORANDOM, GENTTROUGH, GENTPEAK, GENTRANDOM, TOBRATROUGH, TOBRAPEAK, TOBRARND, AMIKACINPEAK, AMIKACINTROU, AMIKACIN,  in the last 72 hours   Microbiology: No results found for this or any previous visit (from the past 720 hour(s)).  Medical History: Past Medical History  Diagnosis Date  . Sickle cell disease   . Avascular necrosis of femur head, right   . H/O allergic rhinitis   . H/O hypokalemia   . Chronic pain syndrome   . H/O wheezing     with colds    Medications:  Scheduled:  . sodium chloride   Intravenous STAT  . albuterol  2.5 mg Nebulization BID  . [COMPLETED] azithromycin  500 mg Intravenous Once  . ceFEPime (MAXIPIME) IV  1 g Intravenous Q8H  . [COMPLETED] diphenhydrAMINE  25 mg Oral Once  . enoxaparin (LOVENOX) injection  40 mg Subcutaneous Q24H  . folic acid  1 mg Oral Daily  . [COMPLETED]  HYDROmorphone (DILAUDID) injection  4 mg Intravenous Once  . HYDROmorphone PCA 0.3 mg/mL   Intravenous Q4H  . hydroxyurea  2,000 mg Oral Q breakfast  . ketorolac  15 mg Intravenous Q6H  .  morphine  60 mg Oral Q8H  . pantoprazole  40 mg Oral Daily  . promethazine  25 mg Intravenous Once  . vancomycin  1,000 mg Intravenous Q8H  . [DISCONTINUED] albuterol  2.5 mg Nebulization Q6H  . [DISCONTINUED] azithromycin  500 mg Intravenous Q24H  . [DISCONTINUED] cefTRIAXone (ROCEPHIN)  IV  1 g Intravenous Q24H  . [DISCONTINUED] HYDROmorphone PCA 0.3 mg/mL   Intravenous Q4H  . [DISCONTINUED] hydroxyurea  2,000 mg Oral Daily   Assessment: 26 yom admitted w/ sickle cell crisis to be started on vancomycin and cefepime for suspected HCAP. WBC 10.4, afebrile, no cultures. Est crcl >167ml/min  Goal of Therapy:  Vancomycin trough level 15-20 mcg/ml  Plan:  1. Initiate Vancomycin 1 GM IV q8h 2. Initiate Cefepime 1 GM IV q8h 3. F/u cx and renal function 4. Vanc trough at [facility]  Bola A. Wandra Feinstein D Clinical Pharmacist Pager:[phone] Phone [phone] 12/24/2012 2:47 PM

## 2012-12-24 NOTE — Progress Notes (Signed)
[facility] PROGRESS NOTE  Dennis Bernard ZOX:[id number] DOB: [birth year] DOA: 12/23/2012 PCP: No primary provider on file.  Assessment/Plan  Sickle cell pain crisis with chest pain with sickle cell anemia, pain still uncontrolled -  Restart ms contin for chronic pain control -  Continue toradol -  Increase PCA to custom dose:  0.5/0.5/0.5/65min/10mg  - Continue IV fluid hydration, O2  - Continue home dose of Hydrea and folic acid   Acute chest vs. HCAP (hospitalized within the last month) with rales at the right base -  Repeat CXR this morning.  If no evidence of pulmonary edema, will transfuse 1 unit PRBC -  Monitor carefully for evidence of pulmonary edema -  Continue continuous pulse oximetry -  Change to antibiotics for HCAP, vanc and cefepime -  Continue IS and albuterol -  S. pneumo and legionella urine antigen  Elevated D-dimer, mildly elevated, may be indicative of PE, but is more likely elevated from episode of acute chest and HCAP.  Unable to perform CTa in the this Hg SS patient and VQ is likely to be equivocal in the setting of pneumonia/acute chest -  Will defer further investigation at this time  Diet:  Regular Access:  Port IVF:  50 ml/h Proph:  lovenox  Code Status: full code Family Communication: spoke with patient alone Disposition Plan: pending medically stable.  Continue telemetry   Consultants:  none  Procedures:  PCA  Antibiotics:  Ceftriaxone and azithromycin 4/11 >> 4/12  Vancomycin and cefepime 4/12 >>    HPI/Subjective:  Denies fevers, chills, headache.  Persistent 9/10 chest pain with cough, not improved since admission.  Mild SOB.  Mild nausea, no vomiting today.  Poor appetite.  No other pains present  Objective: Filed Vitals:   12/24/12 0800 12/24/12 0829 12/24/12 0924 12/24/12 1142  BP:   100/63   Pulse:   89   Temp:   98.9 F (37.2 C)   TempSrc:      Resp: 20 20 21 20   Height:      SpO2: 99% 99% 100% 97%     Intake/Output Summary (Last 24 hours) at 12/24/12 1248 Last data filed at 12/24/12 0925  Gross per 24 hour  Intake 1643.33 ml  Output   1350 ml  Net 293.33 ml   There were no vitals filed for this visit.  Exam:   General:  Thin AAM, No acute distress  HEENT:  NCAT, MMM  Cardiovascular:  RRR, nl S1, S2 no mrg, 2+ pulses, warm extremities  Respiratory:  Rales at the right base and diminished at the left base.  No wheeze or rhonchi.  No increased WOB  Abdomen:   NABS, soft, NT/ND  MSK:   Normal tone and bulk, no LEE  Neuro:  Grossly intact  Data Reviewed: Basic Metabolic Panel:  Recent Labs Lab 12/23/12 1230 12/23/12 2000 12/24/12 0500  NA 138  --  136  K 4.1  --  3.7  CL 104  --  103  CO2 26  --  25  GLUCOSE 90  --  105*  BUN 12  --  13  CREATININE 0.64 0.69 0.75  CALCIUM 9.6  --  8.4  MG  --  2.3  --    Liver Function Tests:  Recent Labs Lab 12/23/12 1230 12/24/12 0500  AST 26 24  ALT 24 13  ALKPHOS 101 87  BILITOT 1.3* 0.8  PROT 7.6 6.4  ALBUMIN 4.5 3.6   No results found for this basename:  LIPASE, AMYLASE,  in the last 168 hours No results found for this basename: AMMONIA,  in the last 168 hours CBC:  Recent Labs Lab 12/23/12 1230 12/23/12 2000  WBC 7.7 10.4  HGB 8.9* 8.5*  HCT 26.1* 24.2*  MCV 96.3 96.4  PLT 338 329   Cardiac Enzymes: No results found for this basename: CKTOTAL, CKMB, CKMBINDEX, TROPONINI,  in the last 168 hours BNP (last 3 results) No results found for this basename: PROBNP,  in the last 8760 hours CBG: No results found for this basename: GLUCAP,  in the last 168 hours  No results found for this or any previous visit (from the past 240 hour(s)).   Studies: Dg Chest 2 View  12/23/2012  *RADIOLOGY REPORT*  Clinical Data: Chest pain, sickle cell pain crisis  CHEST - 2 VIEW  Comparison: Prior chest x-ray 12/09/2012  Findings: Left IJ approach dual lumen portacatheter with the tip at the superior cavoatrial  junction.  Inspiratory volumes are low. Mild patchy opacity in the right base seen on the frontal view. Cardiac and mediastinal contours within normal limits.  No pleural effusion or pneumothorax.  Surgical changes of prior left shoulder arthroplasty.  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  Characteristic vertebral body endplate changes consistent with the patient's clinical history of sickle cell anemia.  IMPRESSION:  1.  Patchy right basilar opacity may reflect atelectasis in the setting of low lung volumes, or infiltrate in the setting of sickle cell pain crisis. 2.  Stable position of left IJ approach portacatheter. 3.  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   Original Report Authenticated By: Malachy Moan, M.D.     Scheduled Meds: . sodium chloride   Intravenous STAT  . albuterol  2.5 mg Nebulization BID  . azithromycin  500 mg Intravenous Q24H  . cefTRIAXone (ROCEPHIN)  IV  1 g Intravenous Q24H  . enoxaparin (LOVENOX) injection  40 mg Subcutaneous Q24H  . folic acid  1 mg Oral Daily  . HYDROmorphone PCA 0.3 mg/mL   Intravenous Q4H  . hydroxyurea  2,000 mg Oral Q breakfast  . ketorolac  15 mg Intravenous Q6H  . morphine  60 mg Oral Q8H  . pantoprazole  40 mg Oral Daily  . promethazine  25 mg Intravenous Once   Continuous Infusions: . dextrose 5 % and 0.45% NaCl 100 mL/hr at 12/24/12 0516    Principal Problem:   Sickle cell pain crisis Active Problems:   Sickle cell anemia   Chest pain    Time spent: 30 min    Aranda Bihm, [facility]  [facility] Pager [phone]. If 7PM-7AM, please contact night-coverage at [email or web address], password [email or web address] 12/24/2012, 12:48 PM  LOS: 1 day

## 2012-12-25 LAB — COMPREHENSIVE METABOLIC PANEL
Albumin: 3.4 g/dL — ABNORMAL LOW (ref 3.5–5.2)
BUN: 6 mg/dL (ref 6–23)
Creatinine, Ser: 0.57 mg/dL (ref 0.50–1.35)
Total Protein: 6.3 g/dL (ref 6.0–8.3)

## 2012-12-25 LAB — CBC
HCT: 23.7 % — ABNORMAL LOW (ref 39.0–52.0)
Hemoglobin: 8.1 g/dL — ABNORMAL LOW (ref 13.0–17.0)
MCV: 94.4 fL (ref 78.0–100.0)
RBC: 2.51 MIL/uL — ABNORMAL LOW (ref 4.22–5.81)
WBC: 7.8 10*3/uL (ref 4.0–10.5)

## 2012-12-25 LAB — RETICULOCYTES: Retic Count, Absolute: 183.2 10*3/uL (ref 19.0–186.0)

## 2012-12-25 MED ORDER — HEPARIN SOD (PORK) LOCK FLUSH 100 UNIT/ML IV SOLN
500.0000 [IU] | INTRAVENOUS | Status: DC
  Administered 2012-12-25: 500 [IU]
  Filled 2012-12-25: qty 5

## 2012-12-25 MED ORDER — HYDROMORPHONE HCL PF 2 MG/ML IJ SOLN: 4.0000 mg | INTRAMUSCULAR | Status: DC | PRN

## 2012-12-25 MED ORDER — HEPARIN SOD (PORK) LOCK FLUSH 100 UNIT/ML IV SOLN
500.0000 [IU] | INTRAVENOUS | Status: DC | PRN
  Administered 2012-12-25: 500 [IU]
  Filled 2012-12-25: qty 5

## 2012-12-25 MED ORDER — MORPHINE SULFATE ER 60 MG PO TBCR: 60.0000 mg | EXTENDED_RELEASE_TABLET | Freq: Three times a day (TID) | ORAL | Status: DC

## 2012-12-25 MED ORDER — MORPHINE SULFATE 15 MG PO TABS: 15.0000 mg | ORAL_TABLET | ORAL | Status: DC | PRN

## 2012-12-25 MED ORDER — MORPHINE SULFATE 15 MG PO TABS
15.0000 mg | ORAL_TABLET | ORAL | Status: DC | PRN
  Administered 2012-12-25: 15 mg via ORAL
  Filled 2012-12-25: qty 1

## 2012-12-25 NOTE — Discharge Summary (Signed)
Physician Discharge Summary  Dennis Bernard XLK:[id number] DOB: 1985-10-24 DOA: 12/23/2012  PCP: No primary provider on file.  Admit date: 12/23/2012 Discharge date: 12/25/2012  Recommendations for Outpatient Follow-up:  1. Follow up with Dr. Ashley Royalty at already scheduled appointment in 2 weeks from discharge  Discharge Diagnoses:  Principal Problem:   Sickle cell pain crisis Active Problems:   Sickle cell anemia   Chest pain   Acute chest syndrome   HCAP (healthcare-associated pneumonia)   Discharge Condition: stable, improved  Diet recommendation: regular  Wt Readings from Last 3 Encounters:  12/24/12 78.5 kg (173 lb 1 oz)  12/12/12 78.518 kg (173 lb 1.6 oz)  11/26/12 83 kg (182 lb 15.7 oz)    History of present illness:   Patient is a 27 year old male with past medical history of sickle cell anemia and admissions for sickle cell crisis presented with low back and chest pain started 2 days ago. Patient stated that the chest pain is midsternal without radiation typical of his sickle cell crisis however he also had shortness of breath this time. He complained of dry cough, nausea, 10/10 pain in the chest, lower back and right hip. Patient also reports that Dilaudid PCA typically in the first 48 hours alleviates the pain.    Hospital Course:   Sickle cell pain crisis with chest pain with sickle cell anemia.  Initially there was concern for acute chest given infiltrate seen on CXR, however, patient used incentive spirometer and his repeat CXR was clear and his rales cleared from exam.  He remained afebrile without respiratory symptoms.  He was not transfused and his antibiotics, which were started for presumptive HCAP (vanc and cefepime) were discontinued.  His pain was well controlled on his home MS contin with custom dilaudid PCA 0.5/0.5/0.5/37min/10mg .  He was transitioned to home meds the morning of 4/13 and felt ready for discharge later in the day.  He has follow up arranged  in 2 weeks with Dr. Ashley Royalty and requests prescriptions of MSIR and MS contin for the next two weeks.  Given 90 tabs of MSIR and 45 tabs of MS contin.  He should stay hydrated and continue using ibuprofen at home for the next few days.  Continue hydrea and folic acid at home.    Elevated D-dimer, mildly elevated, may be indicative of PE, but is more likely elevated from episode of acute chest and HCAP. Unable to perform CTa in the this Hg SS patient and VQ is likely to be equivocal in the setting of pneumonia/acute chest.  No evidence of LEE or calf pain to suggest DVT.  Defered further investigation at this time.    Consultants:  none Procedures:  PCA Antibiotics:  Ceftriaxone and azithromycin 4/11 >> 4/12  Vancomycin and cefepime 4/12 >> 4/13   Discharge Exam: Filed Vitals:   12/25/12 0923  BP: 104/59  Pulse: 85  Temp: 98 F (36.7 C)  Resp: 20   Filed Vitals:   12/25/12 0453 12/25/12 0524 12/25/12 0752 12/25/12 0923  BP:  101/66  104/59  Pulse:  74  85  Temp:  97.6 F (36.4 C)  98 F (36.7 C)  TempSrc:  Oral    Resp: 11 16  20   Height:      Weight:      SpO2: 100% 100% 99% 100%   Patient states pain score is 5/10.  Denies fevers, cough, wheeze, URI symptoms.  Asking to go home.    General: Thin AAM, No acute distress  HEENT:  NCAT, MMM  Cardiovascular: RRR, nl S1, S2 no mrg, 2+ pulses, warm extremities  Respiratory: CTAB. No increased WOB  Abdomen: NABS, soft, NT/ND  MSK: Normal tone and bulk, no LEE  Neuro: Grossly intact  Discharge Instructions      Discharge Orders   Future Orders Complete By Expires     Call MD for:  difficulty breathing, headache or visual disturbances  As directed     Call MD for:  extreme fatigue  As directed     Call MD for:  hives  As directed     Call MD for:  persistant dizziness or light-headedness  As directed     Call MD for:  persistant nausea and vomiting  As directed     Call MD for:  severe uncontrolled pain  As directed      Call MD for:  temperature >100.4  As directed     Diet general  As directed     Discharge instructions  As directed     Comments:      You were hospitalized with pain crisis in the chest.  Your initial chest x-ray showed a possible pneumonia or acute chest, however, your follow up x-ray and exam were clear.  You were started on antibiotics for pneumonia, but you do not need to continue these at home.  Your pain was controled with your home dose of MS contin and with custom dose PCA.  Please tell this to your admitted doctor the next time you are admitted for pain crisis so your pain can be controlled more quickly.  Please continue taking ibuprofen for its antiinflammatory properties for the next 2 days 800mg  three times daily, then may stop.  I gave you a two week prescription of your MS contin and your MSIR.  Please follow up with Dr.  Ashley Royalty within 2 weeks.    Increase activity slowly  As directed         Medication List    TAKE these medications       albuterol 108 (90 BASE) MCG/ACT inhaler  Commonly known as:  PROVENTIL HFA;VENTOLIN HFA  Inhale 1 puff into the lungs every 6 (six) hours as needed for wheezing or shortness of breath. wheezing     diphenhydrAMINE 25 MG tablet  Commonly known as:  BENADRYL  Take 25 mg by mouth every 8 (eight) hours as needed. For itching.     folic acid 1 MG tablet  Commonly known as:  FOLVITE  Take 1 tablet (1 mg total) by mouth daily.     hydroxyurea 500 MG capsule  Commonly known as:  HYDREA  Take 4 capsules (2,000 mg total) by mouth daily. May take with food to minimize GI side effects.     morphine 60 MG 12 hr tablet  Commonly known as:  MS CONTIN  Take 1 tablet (60 mg total) by mouth every 8 (eight) hours.     morphine 15 MG tablet  Commonly known as:  MSIR  Take 1 tablet (15 mg total) by mouth every 4 (four) hours as needed for pain (breakthrough pain). Pain     pantoprazole 40 MG tablet  Commonly known as:  PROTONIX  Take 1 tablet (40  mg total) by mouth daily at 12 noon.     promethazine 25 MG tablet  Commonly known as:  PHENERGAN  Take 1 tablet (25 mg total) by mouth every 6 (six) hours as needed for nausea.     senna-docusate 8.6-50 MG  per tablet  Commonly known as:  Senokot-S  Take 1 tablet by mouth daily as needed for constipation.       Follow-up Information   Follow up with MATTHEWS,MICHELLE A., MD. Schedule an appointment as soon as possible for a visit in 2 weeks.   Contact information:   [address] [place] [address] [phone]        The results of significant diagnostics from this hospitalization (including imaging, microbiology, ancillary and laboratory) are listed below for reference.    Significant Diagnostic Studies: Dg Chest 2 View  12/23/2012  *RADIOLOGY REPORT*  Clinical Data: Chest pain, sickle cell pain crisis  CHEST - 2 VIEW  Comparison: Prior chest x-ray 12/09/2012  Findings: Left IJ approach dual lumen portacatheter with the tip at the superior cavoatrial junction.  Inspiratory volumes are low. Mild patchy opacity in the right base seen on the frontal view. Cardiac and mediastinal contours within normal limits.  No pleural effusion or pneumothorax.  Surgical changes of prior left shoulder arthroplasty.  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  Characteristic vertebral body endplate changes consistent with the patient's clinical history of sickle cell anemia.  IMPRESSION:  1.  Patchy right basilar opacity may reflect atelectasis in the setting of low lung volumes, or infiltrate in the setting of sickle cell pain crisis. 2.  Stable position of left IJ approach portacatheter. 3.  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   Original Report Authenticated By: Malachy Moan, M.D.    Dg Chest Port 1 View  12/24/2012  *RADIOLOGY REPORT*  Clinical Data: 1-day  history of sternal chest pain.  Current history of sickle cell disease.  PORTABLE CHEST - 1 VIEW 12/24/2012 1314 hours:  Comparison: Two-view chest x-ray 12/23/2012, 11/22/2012, 11/10/2012, 09/16/2012, 08/16/2012.  Findings: Cardiac silhouette upper normal in size for AP portable technique, unchanged.  Hilar and mediastinal contours unremarkable. Lungs clear.  Bronchovascular markings normal.  Pulmonary vascularity normal.  No pneumothorax.  No pleural effusions.  Prior left shoulder arthroplasty with anatomic alignment.  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  Left jugular Port-A-Cath tip projects over the lower SVC.  IMPRESSION: No acute cardiopulmonary disease.   Original Report Authenticated By: Hulan Saas, M.D.    Dg Chest Port 1 View  12/09/2012  *RADIOLOGY REPORT*  Clinical Data: Chest pain  PORTABLE CHEST - 1 VIEW  Comparison: 11/22/2012  Findings: The left chest port is again seen.  The cardiac shadow is stable.  The overall level of inspiratory effort is poor.  Some crowding of vascular markings is again noted although no focal infiltrate is seen.  IMPRESSION: Poor inspiratory effort.  No acute abnormality is noted.   Original Report Authenticated By: Alcide Clever, M.D.     Microbiology: No results found for this or any previous visit (from the past 240 hour(s)).   Labs: Basic Metabolic Panel:  Recent Labs Lab 12/23/12 1230 12/23/12 2000 12/24/12 0500 12/25/12 0610  NA 138  --  136 138  K 4.1  --  3.7 3.5  CL 104  --  103 105  CO2 26  --  25 25  GLUCOSE 90  --  105* 100*  BUN 12  --  13 6  CREATININE 0.64 0.69 0.75 0.57  CALCIUM 9.6  --  8.4 8.6  MG  --  2.3  --   --    Liver Function Tests:  Recent Labs Lab 12/23/12 1230 12/24/12 0500 12/25/12 0610  AST 26 24 20   ALT 24 13 11   ALKPHOS 101 87 80  BILITOT 1.3* 0.8 0.7  PROT 7.6 6.4 6.3  ALBUMIN 4.5 3.6 3.4*   No results found for this basename: LIPASE, AMYLASE,  in the last 168 hours No results  found for this basename: AMMONIA,  in the last 168 hours CBC:  Recent Labs Lab 12/23/12 1230 12/23/12 2000 12/25/12 0610  WBC 7.7 10.4 7.8  HGB 8.9* 8.5* 8.1*  HCT 26.1* 24.2* 23.7*  MCV 96.3 96.4 94.4  PLT 338 329 363   Cardiac Enzymes: No results found for this basename: CKTOTAL, CKMB, CKMBINDEX, TROPONINI,  in the last 168 hours BNP: BNP (last 3 results) No results found for this basename: PROBNP,  in the last 8760 hours CBG: No results found for this basename: GLUCAP,  in the last 168 hours  Time coordinating discharge: 45 minutes  Signed:  Monette Omara  [facility] 12/25/2012, 10:09 AM

## 2013-01-06 ENCOUNTER — Inpatient Hospital Stay (HOSPITAL_COMMUNITY)
Admission: EM | Admit: 2013-01-06 | Discharge: 2013-01-08 | DRG: 812 | Disposition: A | Discharge status: Home / Self Care | Payer: Medicare Other | Attending: Internal Medicine | Admitting: Internal Medicine

## 2013-01-06 ENCOUNTER — Encounter (HOSPITAL_COMMUNITY): Payer: Self-pay | Admitting: Emergency Medicine

## 2013-01-06 ENCOUNTER — Emergency Department (HOSPITAL_COMMUNITY): Payer: Medicare Other

## 2013-01-06 DIAGNOSIS — D5700 Hb-SS disease with crisis, unspecified: Principal | ICD-10-CM | POA: Diagnosis present

## 2013-01-06 DIAGNOSIS — D571 Sickle-cell disease without crisis: Secondary | ICD-10-CM | POA: Diagnosis present

## 2013-01-06 DIAGNOSIS — M549 Dorsalgia, unspecified: Secondary | ICD-10-CM | POA: Diagnosis present

## 2013-01-06 DIAGNOSIS — K219 Gastro-esophageal reflux disease without esophagitis: Secondary | ICD-10-CM | POA: Diagnosis present

## 2013-01-06 DIAGNOSIS — G8929 Other chronic pain: Secondary | ICD-10-CM

## 2013-01-06 LAB — CBC WITH DIFFERENTIAL/PLATELET
Basophils Absolute: 0.1 10*3/uL (ref 0.0–0.1)
Eosinophils Absolute: 0 10*3/uL (ref 0.0–0.7)
Eosinophils Relative: 0 % (ref 0–5)
MCH: 33.3 pg (ref 26.0–34.0)
Monocytes Absolute: 0.8 10*3/uL (ref 0.1–1.0)
Neutrophils Relative %: 59 % (ref 43–77)
Platelets: 370 10*3/uL (ref 150–400)
RBC: 2.55 MIL/uL — ABNORMAL LOW (ref 4.22–5.81)
RDW: 17.5 % — ABNORMAL HIGH (ref 11.5–15.5)
WBC: 9.1 10*3/uL (ref 4.0–10.5)

## 2013-01-06 LAB — COMPREHENSIVE METABOLIC PANEL
AST: 28 U/L (ref 0–37)
Albumin: 4.4 g/dL (ref 3.5–5.2)
BUN: 12 mg/dL (ref 6–23)
Calcium: 9.3 mg/dL (ref 8.4–10.5)
Chloride: 103 mEq/L (ref 96–112)
Creatinine, Ser: 0.61 mg/dL (ref 0.50–1.35)
Total Protein: 7.3 g/dL (ref 6.0–8.3)

## 2013-01-06 LAB — RETICULOCYTES
RBC.: 2.55 MIL/uL — ABNORMAL LOW (ref 4.22–5.81)
Retic Count, Absolute: 372.3 10*3/uL — ABNORMAL HIGH (ref 19.0–186.0)
Retic Ct Pct: 14.6 % — ABNORMAL HIGH (ref 0.4–3.1)

## 2013-01-06 LAB — MAGNESIUM: Magnesium: 2.3 mg/dL (ref 1.5–2.5)

## 2013-01-06 MED ORDER — PANTOPRAZOLE SODIUM 40 MG PO TBEC
40.0000 mg | DELAYED_RELEASE_TABLET | Freq: Every day | ORAL | Status: DC
  Administered 2013-01-06 – 2013-01-08 (×3): 40 mg via ORAL
  Filled 2013-01-06 (×3): qty 1

## 2013-01-06 MED ORDER — HYDROMORPHONE HCL PF 1 MG/ML IJ SOLN: 1.0000 mg | INTRAMUSCULAR | Status: AC | PRN

## 2013-01-06 MED ORDER — DIPHENHYDRAMINE HCL 50 MG/ML IJ SOLN
25.0000 mg | Freq: Once | INTRAMUSCULAR | Status: AC
  Administered 2013-01-06: 25 mg via INTRAVENOUS
  Filled 2013-01-06: qty 1

## 2013-01-06 MED ORDER — ONDANSETRON HCL 4 MG/2ML IJ SOLN
4.0000 mg | Freq: Once | INTRAMUSCULAR | Status: AC
  Administered 2013-01-06: 4 mg via INTRAVENOUS
  Filled 2013-01-06: qty 2

## 2013-01-06 MED ORDER — ALBUTEROL SULFATE HFA 108 (90 BASE) MCG/ACT IN AERS: 1.0000 | INHALATION_SPRAY | Freq: Four times a day (QID) | RESPIRATORY_TRACT | Status: DC | PRN

## 2013-01-06 MED ORDER — SODIUM CHLORIDE 0.9 % IJ SOLN: 9.0000 mL | INTRAMUSCULAR | Status: DC | PRN

## 2013-01-06 MED ORDER — PROMETHAZINE HCL 25 MG PO TABS
12.5000 mg | ORAL_TABLET | ORAL | Status: DC | PRN
  Administered 2013-01-07: 25 mg via ORAL
  Filled 2013-01-06: qty 1

## 2013-01-06 MED ORDER — DIPHENHYDRAMINE HCL 25 MG PO CAPS
25.0000 mg | ORAL_CAPSULE | ORAL | Status: DC | PRN
  Administered 2013-01-07: 50 mg via ORAL
  Filled 2013-01-06: qty 2

## 2013-01-06 MED ORDER — PROMETHAZINE HCL 25 MG PO TABS: 25.0000 mg | ORAL_TABLET | Freq: Four times a day (QID) | ORAL | Status: DC | PRN

## 2013-01-06 MED ORDER — HYDROXYUREA 500 MG PO CAPS
2000.0000 mg | ORAL_CAPSULE | Freq: Every morning | ORAL | Status: DC
  Administered 2013-01-07 – 2013-01-08 (×2): 2000 mg via ORAL
  Filled 2013-01-06 (×3): qty 4

## 2013-01-06 MED ORDER — SODIUM CHLORIDE 0.9 % IV SOLN: INTRAVENOUS | Status: AC

## 2013-01-06 MED ORDER — SENNOSIDES-DOCUSATE SODIUM 8.6-50 MG PO TABS
1.0000 | ORAL_TABLET | Freq: Every day | ORAL | Status: DC | PRN
  Filled 2013-01-06: qty 1

## 2013-01-06 MED ORDER — MORPHINE SULFATE ER 60 MG PO TBCR
60.0000 mg | EXTENDED_RELEASE_TABLET | Freq: Three times a day (TID) | ORAL | Status: DC
  Administered 2013-01-06 – 2013-01-08 (×6): 60 mg via ORAL
  Filled 2013-01-06 (×6): qty 1

## 2013-01-06 MED ORDER — SODIUM CHLORIDE 0.9 % IV BOLUS (SEPSIS)
1000.0000 mL | Freq: Once | INTRAVENOUS | Status: AC
  Administered 2013-01-06: 1000 mL via INTRAVENOUS

## 2013-01-06 MED ORDER — HYDROMORPHONE HCL PF 2 MG/ML IJ SOLN
2.0000 mg | Freq: Once | INTRAMUSCULAR | Status: AC
  Administered 2013-01-06: 2 mg via INTRAVENOUS
  Filled 2013-01-06: qty 1

## 2013-01-06 MED ORDER — HYDROMORPHONE 0.3 MG/ML IV SOLN
INTRAVENOUS | Status: DC
  Administered 2013-01-06: 5.39 mg via INTRAVENOUS
  Administered 2013-01-06 (×2): via INTRAVENOUS
  Administered 2013-01-07: 1.88 mg via INTRAVENOUS
  Administered 2013-01-07: 4.2 mg via INTRAVENOUS
  Administered 2013-01-07: 08:00:00 via INTRAVENOUS
  Administered 2013-01-07: 4.2 mg via INTRAVENOUS
  Administered 2013-01-07: 2.4 mg via INTRAVENOUS
  Filled 2013-01-06 (×3): qty 25

## 2013-01-06 MED ORDER — DIPHENHYDRAMINE HCL 50 MG/ML IJ SOLN
12.5000 mg | INTRAMUSCULAR | Status: DC | PRN
  Administered 2013-01-07: 25 mg via INTRAVENOUS
  Administered 2013-01-07 – 2013-01-08 (×3): 12.5 mg via INTRAVENOUS
  Administered 2013-01-08: 25 mg via INTRAVENOUS
  Filled 2013-01-06 (×5): qty 1

## 2013-01-06 MED ORDER — ACETAMINOPHEN 650 MG RE SUPP: 650.0000 mg | RECTAL | Status: DC | PRN

## 2013-01-06 MED ORDER — DIPHENHYDRAMINE HCL 12.5 MG/5ML PO ELIX: 12.5000 mg | ORAL_SOLUTION | Freq: Four times a day (QID) | ORAL | Status: DC | PRN

## 2013-01-06 MED ORDER — ACETAMINOPHEN 325 MG PO TABS: 650.0000 mg | ORAL_TABLET | ORAL | Status: DC | PRN

## 2013-01-06 MED ORDER — ALBUTEROL SULFATE (5 MG/ML) 0.5% IN NEBU
5.0000 mg | INHALATION_SOLUTION | Freq: Once | RESPIRATORY_TRACT | Status: AC
  Administered 2013-01-06: 5 mg via RESPIRATORY_TRACT
  Filled 2013-01-06: qty 1

## 2013-01-06 MED ORDER — ONDANSETRON HCL 4 MG/2ML IJ SOLN: 4.0000 mg | Freq: Four times a day (QID) | INTRAMUSCULAR | Status: DC | PRN

## 2013-01-06 MED ORDER — PROMETHAZINE HCL 25 MG RE SUPP: 12.5000 mg | RECTAL | Status: DC | PRN

## 2013-01-06 MED ORDER — FOLIC ACID 1 MG PO TABS
1.0000 mg | ORAL_TABLET | Freq: Every morning | ORAL | Status: DC
  Administered 2013-01-07 – 2013-01-08 (×2): 1 mg via ORAL
  Filled 2013-01-06 (×2): qty 1

## 2013-01-06 MED ORDER — DIPHENHYDRAMINE HCL 50 MG/ML IJ SOLN: 12.5000 mg | Freq: Four times a day (QID) | INTRAMUSCULAR | Status: DC | PRN

## 2013-01-06 MED ORDER — ONDANSETRON HCL 4 MG/2ML IJ SOLN: 4.0000 mg | Freq: Three times a day (TID) | INTRAMUSCULAR | Status: AC | PRN

## 2013-01-06 MED ORDER — NALOXONE HCL 0.4 MG/ML IJ SOLN: 0.4000 mg | INTRAMUSCULAR | Status: DC | PRN

## 2013-01-06 MED ORDER — SODIUM CHLORIDE 0.9 % IV SOLN
INTRAVENOUS | Status: AC
  Administered 2013-01-06 – 2013-01-07 (×2): via INTRAVENOUS

## 2013-01-06 MED ORDER — FOLIC ACID 1 MG PO TABS: 1.0000 mg | ORAL_TABLET | Freq: Every day | ORAL | Status: DC

## 2013-01-06 NOTE — ED Provider Notes (Signed)
History     CSN: [id number]  Arrival date & time 01/06/13  1044   First MD Initiated Contact with Patient 01/06/13 1116      Chief Complaint  Patient presents with  . Sickle Cell Pain Crisis    (Consider location/radiation/quality/duration/timing/severity/associated sxs/prior treatment) HPI Comments: 27 year old male with a past medical history of sickle cell disease presents to the emergency department complaining of a sickle cell crisis x 2 days. Patient is complaining of lower back pain radiating up to his neck which his normal sickle cell pain, tried taking his MS Contin at home without relief. Pain rated 10 out of 10. Also complaining of midsternal chest pain and tightness radiating to the right side. Admits to fever of 100.2 yesterday, took tylenol which helped break the fever. Denies cough, abdominal pain, nausea, vomiting, confusion, recent illness, sick contacts.  Patient is a 27 y.o. male presenting with sickle cell pain. The history is provided by the patient.  Sickle Cell Pain Crisis  Associated symptoms include chest pain, back pain and neck pain. Pertinent negatives include no nausea, no vomiting and no headaches.    Past Medical History  Diagnosis Date  . Sickle cell disease   . Avascular necrosis of femur head, right   . H/O allergic rhinitis   . H/O hypokalemia   . Chronic pain syndrome   . H/O wheezing     with colds    Past Surgical History  Procedure Laterality Date  . Cholecystectomy  1995  . Portacath placement Left 07/04/2012  . Exchange transfusion  02/2008    perioperatively for shoulder surgery  . 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 Sickle cell anemia Brother   . Sickle cell trait Father   . Sickle cell trait Mother   . Diabetes Mellitus II Mother   . Sickle cell anemia Paternal Uncle   . Asthma Neg Hx   . Allergic rhinitis Neg Hx     History  Substance Use Topics  . Smoking  status: Never Smoker   . Smokeless tobacco: Never Used  . Alcohol Use: No      Review of Systems  Constitutional: Positive for fever and chills.  HENT: Positive for neck pain. Negative for neck stiffness.   Respiratory: Positive for chest tightness. Negative for shortness of breath.   Cardiovascular: Positive for chest pain.  Gastrointestinal: Negative for nausea and vomiting.  Musculoskeletal: Positive for back pain.  Neurological: Negative for dizziness, light-headedness and headaches.  All other systems reviewed and are negative.    Allergies  Review of patient's allergies indicates no known allergies.  Home Medications   Current Outpatient Rx  Name  Route  Sig  Dispense  Refill  . albuterol (PROVENTIL HFA;VENTOLIN HFA) 108 (90 BASE) MCG/ACT inhaler   Inhalation   Inhale 1 puff into the lungs every 6 (six) hours as needed for wheezing or shortness of breath. wheezing   1 Inhaler   1   . diphenhydrAMINE (BENADRYL) 25 MG tablet   Oral   Take 25 mg by mouth every 8 (eight) hours as needed. For itching.         . folic acid (FOLVITE) 1 MG tablet   Oral   Take 1 mg by mouth every morning.         . hydroxyurea (HYDREA) 500 MG capsule   Oral   Take 2,000 mg by mouth every morning. May take with food to  minimize GI side effects.         Marland Kitchen morphine (MS CONTIN) 60 MG 12 hr tablet   Oral   Take 1 tablet (60 mg total) by mouth every 8 (eight) hours.   45 tablet   0   . morphine (MSIR) 15 MG tablet   Oral   Take 1 tablet (15 mg total) by mouth every 4 (four) hours as needed for pain (breakthrough pain). Pain   90 tablet   0   . pantoprazole (PROTONIX) 40 MG tablet   Oral   Take 1 tablet (40 mg total) by mouth daily at 12 noon.   30 tablet   1   . promethazine (PHENERGAN) 25 MG tablet   Oral   Take 1 tablet (25 mg total) by mouth every 6 (six) hours as needed for nausea.   30 tablet   0   . senna-docusate (SENOKOT-S) 8.6-50 MG per tablet   Oral   Take  1 tablet by mouth daily as needed for constipation.            BP 126/71  Pulse 102  Temp(Src) 99.4 F (37.4 C) (Oral)  Resp 18  SpO2 100%  Physical Exam  Nursing note and vitals reviewed. Constitutional: He is oriented to person, place, and time. He appears well-developed and well-nourished. No distress.  HENT:  Head: Normocephalic and atraumatic.  Mouth/Throat: Oropharynx is clear and moist.  Eyes: Conjunctivae and EOM are normal. Pupils are equal, round, and reactive to light.  Neck: Trachea normal and normal range of motion. Neck supple.    Cardiovascular: Regular rhythm and normal heart sounds.  Tachycardia present.   Pulmonary/Chest: Effort normal. No accessory muscle usage. No respiratory distress. He has rhonchi in the right upper field. He exhibits tenderness.    Abdominal: Soft. Bowel sounds are normal. There is no tenderness.  Musculoskeletal: Normal range of motion.       Lumbar back: He exhibits tenderness. He exhibits normal range of motion.       Back:  Neurological: He is alert and oriented to person, place, and time.  Skin: Skin is warm and dry. He is not diaphoretic.  Psychiatric: He has a normal mood and affect. His behavior is normal.    ED Course  Procedures (including critical care time)  Labs Reviewed  CBC WITH DIFFERENTIAL  COMPREHENSIVE METABOLIC PANEL  RETICULOCYTES    Date: 01/06/2013  Rate: 86  Rhythm: normal sinus rhythm  QRS Axis: normal  Intervals: normal  ST/T Wave abnormalities: normal  Conduction Disutrbances:none  Narrative Interpretation: no stemi  Old EKG Reviewed: unchanged   Dg Chest 2 View  01/06/2013  *RADIOLOGY REPORT*  Clinical Data: Sickle cell crisis  CHEST - 2 VIEW  Comparison: December 24, 2012.  Findings: Status post left shoulder arthroplasty.  Left internal jugular Port-A-Cath is unchanged in position.  Cardiomediastinal silhouette appears normal.  No pneumothorax or pleural effusion is noted.  No acute pulmonary  disease is seen.  IMPRESSION: No acute cardiopulmonary abnormality seen.   Original Report Authenticated By: Lupita Raider.,  M.D.      1. Sickle cell crisis       MDM  Sickle cell pain crisis- ronchi heard on lung exam, CXR clear. Will give breathing treatment. No change in pain after 2mg  dilaudid, zofran and benadryl- giving another dose of each. 4:07 PM No change after two more doses of dilaudid, zofran and benadryl. Will admit patient. Admission accepted by Dr. Elisabeth Pigeon, [facility] team  1.  Trevor Mace, PA-C 01/06/13 [phone]

## 2013-01-06 NOTE — ED Notes (Signed)
Pt presenting to ed with c/o sickle cell crisis pain in low back and neck.

## 2013-01-06 NOTE — H&P (Signed)
[facility] History and Physical  Dennis Bernard MWU:[id number] DOB: Jun 27, 1986 DOA: 01/06/2013  Referring physician: ER physician PCP: No primary provider on file.   Chief Complaint: generalzied pain   HPI:  27 y.o. male with a past medical history of sickle cell disease who presented to [facility] ED 01/06/13 with worsening lower back pain for past 2 days prior to this admission. Patient reports pain radiating to his lower extremities and neck. He took MS contin at home with no significant symptomatic relief. He did report associated chest pain but this has resolved once he arrived to ED. No shortness of breath, no palpitations. No abdominal pain,no nausea or vomiting. No fever or chills. No cough. In ED, patient has required multiple doses of IV dilaudid and still rates pain 10/10 . His CBC revealed hemoglobin of 8.5. The rest of the blood work is essentially [employer]. His CXR showed no acute cardiopulmonary process.  Assessment and Plan:  Principal Problem:   Sickle cell pain crisis - patient reports no pain relief with IV dilaudid given multiple times in ED - will switch to PCA dilaudid and then taper as pain improves - continue IV fluids - continue antiemetics PRN, benadryl PRN  Active Problems:   Sickle cell anemia - hemoglobin stable on admission  [name] [facility] [phone]   Review of Systems:  Constitutional: Negative for fever, chills and malaise/fatigue. Negative for diaphoresis.  HENT: Negative for hearing loss, ear pain, nosebleeds, congestion, sore throat, neck pain, tinnitus and ear discharge.   Eyes: Negative for blurred vision, double vision, photophobia, pain, discharge and redness.  Respiratory: Negative for cough, hemoptysis, sputum production, shortness of breath, wheezing and stridor.   Cardiovascular: Negative for chest pain, palpitations, orthopnea, claudication and leg swelling.  Gastrointestinal: Negative for nausea, vomiting and abdominal pain.  Negative for heartburn, constipation, blood in stool and melena.  Genitourinary: Negative for dysuria, urgency, frequency, hematuria and flank pain.  Musculoskeletal: per hpi  Skin: Negative for itching and rash.  Neurological: Negative for dizziness and weakness. Negative for tingling, tremors, sensory change, speech change, focal weakness, loss of consciousness and headaches.  Endo/Heme/Allergies: Negative for environmental allergies and polydipsia. Does not bruise/bleed easily.  Psychiatric/Behavioral: Negative for suicidal ideas. The patient is not nervous/anxious.      Past Medical History  Diagnosis Date  . Sickle cell disease   . Avascular necrosis of femur head, right   . H/O allergic rhinitis   . H/O hypokalemia   . Chronic pain syndrome   . H/O wheezing     with colds   Past Surgical History  Procedure Laterality Date  . Cholecystectomy  1995  . Portacath placement Left 07/04/2012  . Exchange transfusion  02/2008    perioperatively for shoulder surgery  . 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 He has never used smokeless tobacco. He reports that he does not drink alcohol or use illicit drugs.  No Known Allergies  Family History:  Family History  Problem Relation Age of Onset  . Sickle cell anemia Brother   . Sickle cell trait Father   . Sickle cell trait Mother   . Diabetes Mellitus II Mother   . Sickle cell anemia Paternal Uncle   . Asthma Neg Hx   . Allergic rhinitis Neg Hx      Prior to Admission medications   Medication Sig Start Date End Date Taking? Authorizing Provider  albuterol (PROVENTIL HFA;VENTOLIN HFA) 108 (90 BASE) MCG/ACT  inhaler Inhale 1 puff into the lungs every 6 (six) hours as needed for wheezing or shortness of breath. wheezing 11/27/12  Yes Vassie Loll, MD  diphenhydrAMINE (BENADRYL) 25 MG tablet Take 25 mg by mouth every 8 (eight) hours as needed. For itching. 08/23/12  Yes Dow Adolph, MD  folic acid (FOLVITE) 1 MG tablet Take 1 mg by mouth every morning. 11/27/12  Yes Vassie Loll, MD  hydroxyurea (HYDREA) 500 MG capsule Take 2,000 mg by mouth every morning. May take with food to minimize GI side effects. 11/27/12  Yes Vassie Loll, MD  morphine (MS CONTIN) 60 MG 12 hr tablet Take 1 tablet (60 mg total) by mouth every 8 (eight) hours. 12/25/12  Yes Renae Fickle, MD  morphine (MSIR) 15 MG tablet Take 1 tablet (15 mg total) by mouth every 4 (four) hours as needed for pain (breakthrough pain). Pain 12/25/12  Yes Renae Fickle, MD  pantoprazole (PROTONIX) 40 MG tablet Take 1 tablet (40 mg total) by mouth daily at 12 noon. 11/27/12  Yes Vassie Loll, MD  promethazine (PHENERGAN) 25 MG tablet Take 1 tablet (25 mg total) by mouth every 6 (six) hours as needed for nausea. 12/12/12  Yes Novlet Adelina Mings, MD  senna-docusate (SENOKOT-S) 8.6-50 MG per tablet Take 1 tablet by mouth daily as needed for constipation.  08/27/12  Yes Linward Headland, MD   Physical Exam: Filed Vitals:   01/06/13 1101 01/06/13 1331 01/06/13 1510  BP: 126/71 108/65   Pulse: 102    Temp: 99.4 F (37.4 C) 98 F (36.7 C)   TempSrc: Oral Oral   Resp: 18 18   SpO2: 100% 98% 97%    Physical Exam  Constitutional: Appears well-developed and well-nourished. No distress.  HENT: Normocephalic. External right and left ear normal. Oropharynx is clear and moist.  Eyes: Conjunctivae and EOM are normal. PERRLA, no scleral icterus.  Neck: Normal ROM. Neck supple. No JVD. No tracheal deviation. No thyromegaly.  CVS: RRR, S1/S2 +, no murmurs, no gallops, no carotid bruit.  Pulmonary: Effort and breath sounds normal, no stridor, rhonchi, wheezes, rales.  Abdominal: Soft. BS +,  no distension, tenderness, rebound or guarding.  Musculoskeletal: unable to fully assess patient reports being in severe pain.  Lymphadenopathy: No lymphadenopathy noted, cervical, inguinal. Neuro: Alert. Normal reflexes, muscle tone  coordination. No cranial nerve deficit. Skin: Skin is warm and dry. No rash noted. Not diaphoretic. No erythema. No pallor.  Psychiatric: Normal mood and affect. Behavior, judgment, thought content normal.   Labs on Admission:  Basic Metabolic Panel:  Recent Labs Lab 01/06/13 1235  NA 138  K 4.5  CL 103  CO2 26  GLUCOSE 91  BUN 12  CREATININE 0.61  CALCIUM 9.3   Liver Function Tests:  Recent Labs Lab 01/06/13 1235  AST 28  ALT 32  ALKPHOS 103  BILITOT 1.7*  PROT 7.3  ALBUMIN 4.4   No results found for this basename: LIPASE, AMYLASE,  in the last 168 hours No results found for this basename: AMMONIA,  in the last 168 hours CBC:  Recent Labs Lab 01/06/13 1235  WBC 9.1  NEUTROABS 5.4  HGB 8.5*  HCT 24.4*  MCV 95.7  PLT 370   Radiological Exams on Admission: Dg Chest 2 View 01/06/2013   IMPRESSION: No acute cardiopulmonary abnormality seen.   Original Report Authenticated By: Lupita Raider.,  M.D.     Code Status: Full Family Communication: Pt at bedside Disposition Plan: Admit for further evaluation  [name], MD  [facility] Pager [phone]  If 7PM-7AM, please contact night-coverage [email or web address] Password [facility] 01/06/2013, 4:29 PM

## 2013-01-07 LAB — COMPREHENSIVE METABOLIC PANEL
AST: 24 U/L (ref 0–37)
Albumin: 3.6 g/dL (ref 3.5–5.2)
Alkaline Phosphatase: 86 U/L (ref 39–117)
BUN: 7 mg/dL (ref 6–23)
CO2: 26 mEq/L (ref 19–32)
Chloride: 107 mEq/L (ref 96–112)
GFR calc non Af Amer: >90 mL/min (ref >90)
Potassium: 4 mEq/L (ref 3.5–5.1)
Total Bilirubin: 1.2 mg/dL (ref 0.3–1.2)

## 2013-01-07 MED ORDER — HYDROMORPHONE HCL PF 2 MG/ML IJ SOLN
4.0000 mg | INTRAMUSCULAR | Status: DC | PRN
  Administered 2013-01-07 – 2013-01-08 (×13): 4 mg via INTRAVENOUS
  Filled 2013-01-07 (×13): qty 2

## 2013-01-07 MED ORDER — KETOROLAC TROMETHAMINE 30 MG/ML IJ SOLN
30.0000 mg | Freq: Four times a day (QID) | INTRAMUSCULAR | Status: DC
  Administered 2013-01-07 – 2013-01-08 (×5): 30 mg via INTRAVENOUS
  Filled 2013-01-07 (×8): qty 1

## 2013-01-07 MED ORDER — VITAMINS A & D EX OINT
TOPICAL_OINTMENT | CUTANEOUS | Status: AC
  Administered 2013-01-07: 5
  Filled 2013-01-07: qty 5

## 2013-01-07 MED ORDER — IBUPROFEN 400 MG PO TABS
400.0000 mg | ORAL_TABLET | Freq: Once | ORAL | Status: DC
  Filled 2013-01-07: qty 1

## 2013-01-07 NOTE — ED Provider Notes (Signed)
Medical screening examination/treatment/procedure(s) were performed by non-physician practitioner and as supervising physician I was immediately available for consultation/collaboration.   Laray Anger, DO 01/07/13 1030

## 2013-01-07 NOTE — Progress Notes (Signed)
Subjective: 27 year old gentleman with sickle cell painful crisis admitted yesterday. Patient is having severe pain rated at 9/10. He has been Dilaudid PCA pump low dose not controlling his pain. He is getting only 0.3 mg every 8 minutes. This locked out frequently. He denied any shortness of breath but feels tightness in his chest associated to his back pain. No fever no cough.  Objective: Vital signs in last 24 hours: Temp:  [98 F (36.7 C)-98.8 F (37.1 C)] 98.8 F (37.1 C) (04/26 0935) Pulse Rate:  [54-84] 74 (04/26 0935) Resp:  [11-20] 20 (04/26 1200) BP: (105-111)/(57-72) 111/72 mmHg (04/26 0935) SpO2:  [92 %-100 %] 97 % (04/26 1200) Weight:  [77.565 kg (171 lb)] 77.565 kg (171 lb) (04/26 0641) Weight change:  Last BM Date: 01/06/13  Intake/Output from previous day: 04/25 0701 - 04/26 0700 In: 931.3 [I.V.:931.3] Out: 1550 [Urine:1550] Intake/Output this shift: Total I/O In: -  Out: 700 [Urine:700]  General appearance: alert, cooperative and no distress Eyes: conjunctivae/corneas clear. PERRL, EOM's intact. Fundi benign. Resp: clear to auscultation bilaterally Cardio: regular rate and rhythm, S1, S2 normal, no murmur, click, rub or gallop GI: soft, non-tender; bowel sounds normal; no masses,  no organomegaly Extremities: extremities normal, atraumatic, no cyanosis or edema Skin: Skin color, texture, turgor normal. No rashes or lesions Neurologic: Grossly normal  Lab Results:  Recent Labs  01/06/13 1235  WBC 9.1  HGB 8.5*  HCT 24.4*  PLT 370   BMET  Recent Labs  01/06/13 1235 01/07/13 0524  NA 138 141  K 4.5 4.0  CL 103 107  CO2 26 26  GLUCOSE 91 93  BUN 12 7  CREATININE 0.61 0.61  CALCIUM 9.3 8.8    Studies/Results: Dg Chest 2 View  01/06/2013  *RADIOLOGY REPORT*  Clinical Data: Sickle cell crisis  CHEST - 2 VIEW  Comparison: December 24, 2012.  Findings: Status post left shoulder arthroplasty.  Left internal jugular Port-A-Cath is unchanged in  position.  Cardiomediastinal silhouette appears normal.  No pneumothorax or pleural effusion is noted.  No acute pulmonary disease is seen.  IMPRESSION: No acute cardiopulmonary abnormality seen.   Original Report Authenticated By: Lupita Raider.,  M.D.     Medications: I have reviewed the patient's current medications.  Assessment/Plan: The 27 year old gentleman with sickle cell painful crisis.  #1 sickle cell painful crisis: Patient is not getting good control with her current Dilaudid PCA. I would stop the Dilaudid PCA, output and Dilaudid 4 mg IV every 2 hours when necessary for the first 24 hours. Also add Toradol to his regimen. I will continue with MS Contin and his oral morphine again. Once the pain status is enough I will switch him to lower dose Dilaudid prior to discharge. Continue with hydroxyurea.  #2 sickle cell anemia: His hemoglobin is 8.5 on admission. We'll follow H&H closely for any signs of hemolytic crisis.  #3 back pain: This is chronic but also associated with his sickle cell painful crisis.  LOS: 1 day   Shandiin Eisenbeis,LAWAL 01/07/2013, 12:54 PM

## 2013-01-08 LAB — COMPREHENSIVE METABOLIC PANEL
CO2: 27 mEq/L (ref 19–32)
Calcium: 8.8 mg/dL (ref 8.4–10.5)
Creatinine, Ser: 0.69 mg/dL (ref 0.50–1.35)
GFR calc Af Amer: >90 mL/min (ref >90)
GFR calc non Af Amer: >90 mL/min (ref >90)
Glucose, Bld: 85 mg/dL (ref 70–99)
Total Protein: 6.5 g/dL (ref 6.0–8.3)

## 2013-01-08 LAB — CBC
Hemoglobin: 7.9 g/dL — ABNORMAL LOW (ref 13.0–17.0)
RBC: 2.39 MIL/uL — ABNORMAL LOW (ref 4.22–5.81)

## 2013-01-08 MED ORDER — VITAMINS A & D EX OINT
TOPICAL_OINTMENT | CUTANEOUS | Status: AC
  Administered 2013-01-08: 5
  Filled 2013-01-08: qty 5

## 2013-01-08 MED ORDER — MORPHINE SULFATE ER 60 MG PO TBCR: 60.0000 mg | EXTENDED_RELEASE_TABLET | Freq: Three times a day (TID) | ORAL | Status: DC

## 2013-01-08 MED ORDER — MORPHINE SULFATE 15 MG PO TABS: 15.0000 mg | ORAL_TABLET | ORAL | Status: DC | PRN

## 2013-01-08 MED ORDER — HEPARIN SOD (PORK) LOCK FLUSH 100 UNIT/ML IV SOLN
INTRAVENOUS | Status: AC
  Administered 2013-01-08: 17:00:00
  Filled 2013-01-08: qty 5

## 2013-01-08 NOTE — Discharge Summary (Addendum)
Physician Discharge Summary  Patient ID: Dennis Bernard MRN: [id number] DOB/AGE: 12-Jul-1986 27 y.o.  Admit date: 01/06/2013 Discharge date: 01/08/2013  Admission Diagnoses:  Discharge Diagnoses:  Principal Problem:   Sickle cell pain crisis Active Problems:   Sickle cell anemia   Discharged Condition: good  Hospital Course: 27 year old gentleman with known history of sickle cell painful crisis who has not followed up in the clinic in a long time being previously followed by Dr. [name] admitted with sickle cell pain crisis.  #1 sickle cell painful crisis: Patient was admitted and started on IV Dilaudid for pain control. He was treated for 3 days so far. Initially on PCA pump but that was switched to IV Dilaudid 4 mg every 2 hours when necessary. He was also given Toradol and hydroxyurea. Patient responded very well to treatment and is currently stable on his oral pain medications. He has appointment scheduled for followup with Dr. Ashley Royalty at the sickle cell clinic. I am giving him a prescription prescription for his MS Contin as well as MSIR onto he is seen in the clinic.  #2 sickle cell anemia: This was stable. Patient had mild hemolytic anemia and is currently having her hemoglobin at his baseline.  #3 chronic back pain: In addition to sickle cell disease patient has had chronic back pain. This was also probably address this admission.  #4 GERD: Patient was maintained on PPI throughout hospitalization and will continue on that when he goes home.  Consults: None  Significant Diagnostic Studies: labs: Rectal labs included CBC and BMP. His hemoglobin has remained stable so far done this hospitalization.  Treatments: IV hydration and analgesia: Dilaudid and Morphine  Discharge Exam: Blood pressure 110/73, pulse 82, temperature 98 F (36.7 C), temperature source Oral, resp. rate 18, height 5\' 9"  (1.753 m), weight 80.287 kg (177 lb), SpO2 100.00%. General appearance: alert,  cooperative and appears stated age Eyes: conjunctivae/corneas clear. PERRL, EOM's intact. Fundi benign. Neck: no adenopathy, no carotid bruit, no JVD, supple, symmetrical, trachea midline and thyroid not enlarged, symmetric, no tenderness/mass/nodules Resp: clear to auscultation bilaterally Cardio: regular rate and rhythm, S1, S2 normal, no murmur, click, rub or gallop GI: soft, non-tender; bowel sounds normal; no masses,  no organomegaly Extremities: extremities normal, atraumatic, no cyanosis or edema Skin: Skin color, texture, turgor normal. No rashes or lesions Neurologic: Grossly normal  Disposition: 01-Home or Self Care     Medication List    TAKE these medications       albuterol 108 (90 BASE) MCG/ACT inhaler  Commonly known as:  PROVENTIL HFA;VENTOLIN HFA  Inhale 1 puff into the lungs every 6 (six) hours as needed for wheezing or shortness of breath. wheezing     diphenhydrAMINE 25 MG tablet  Commonly known as:  BENADRYL  Take 25 mg by mouth every 8 (eight) hours as needed. For itching.     folic acid 1 MG tablet  Commonly known as:  FOLVITE  Take 1 mg by mouth every morning.     hydroxyurea 500 MG capsule  Commonly known as:  HYDREA  Take 2,000 mg by mouth every morning. May take with food to minimize GI side effects.     morphine 60 MG 12 hr tablet  Commonly known as:  MS CONTIN  Take 1 tablet (60 mg total) by mouth every 8 (eight) hours.     morphine 15 MG tablet  Commonly known as:  MSIR  Take 1 tablet (15 mg total) by mouth every 4 (four) hours as  needed for pain (breakthrough pain). Pain     pantoprazole 40 MG tablet  Commonly known as:  PROTONIX  Take 1 tablet (40 mg total) by mouth daily at 12 noon.     promethazine 25 MG tablet  Commonly known as:  PHENERGAN  Take 1 tablet (25 mg total) by mouth every 6 (six) hours as needed for nausea.     senna-docusate 8.6-50 MG per tablet  Commonly known as:  Senokot-S  Take 1 tablet by mouth daily as needed  for constipation.         SignedLonia Blood 01/08/2013, 3:36 PM  Time spent in discharge planning is 33 minutes.

## 2013-01-08 NOTE — Progress Notes (Signed)
01/08/13 1715 Reviewed discharge with patient. Patient verbalized understanding of discharge instructions. Copy of discharge and prescriptions given to patient.

## 2013-01-14 ENCOUNTER — Inpatient Hospital Stay (HOSPITAL_COMMUNITY)
Admission: EM | Admit: 2013-01-14 | Discharge: 2013-01-17 | DRG: 812 | Disposition: A | Discharge status: Home / Self Care | Payer: Medicare Other | Attending: Internal Medicine | Admitting: Internal Medicine

## 2013-01-14 ENCOUNTER — Emergency Department (HOSPITAL_COMMUNITY): Payer: Medicare Other

## 2013-01-14 ENCOUNTER — Encounter (HOSPITAL_COMMUNITY): Payer: Self-pay | Admitting: *Deleted

## 2013-01-14 DIAGNOSIS — D5700 Hb-SS disease with crisis, unspecified: Secondary | ICD-10-CM

## 2013-01-14 DIAGNOSIS — R079 Chest pain, unspecified: Secondary | ICD-10-CM | POA: Diagnosis present

## 2013-01-14 DIAGNOSIS — G8929 Other chronic pain: Secondary | ICD-10-CM

## 2013-01-14 DIAGNOSIS — Z96619 Presence of unspecified artificial shoulder joint: Secondary | ICD-10-CM

## 2013-01-14 DIAGNOSIS — G894 Chronic pain syndrome: Secondary | ICD-10-CM | POA: Diagnosis present

## 2013-01-14 DIAGNOSIS — D72829 Elevated white blood cell count, unspecified: Secondary | ICD-10-CM | POA: Diagnosis present

## 2013-01-14 DIAGNOSIS — M87059 Idiopathic aseptic necrosis of unspecified femur: Secondary | ICD-10-CM | POA: Diagnosis present

## 2013-01-14 DIAGNOSIS — Z79899 Other long term (current) drug therapy: Secondary | ICD-10-CM

## 2013-01-14 LAB — CBC WITH DIFFERENTIAL/PLATELET
Eosinophils Relative: 1 % (ref 0–5)
HCT: 23.1 % — ABNORMAL LOW (ref 39.0–52.0)
Hemoglobin: 8.1 g/dL — ABNORMAL LOW (ref 13.0–17.0)
Lymphocytes Relative: 51 % — ABNORMAL HIGH (ref 12–46)
Lymphs Abs: 5.9 10*3/uL — ABNORMAL HIGH (ref 0.7–4.0)
MCV: 94.7 fL (ref 78.0–100.0)
Monocytes Absolute: 0.7 10*3/uL (ref 0.1–1.0)
Monocytes Relative: 6 % (ref 3–12)
Platelets: 379 10*3/uL (ref 150–400)
RBC: 2.44 MIL/uL — ABNORMAL LOW (ref 4.22–5.81)
WBC: 11.4 10*3/uL — ABNORMAL HIGH (ref 4.0–10.5)

## 2013-01-14 LAB — COMPREHENSIVE METABOLIC PANEL
ALT: 18 U/L (ref 0–53)
CO2: 24 mEq/L (ref 19–32)
Calcium: 9 mg/dL (ref 8.4–10.5)
GFR calc Af Amer: >90 mL/min (ref >90)
GFR calc non Af Amer: >90 mL/min (ref >90)
Glucose, Bld: 109 mg/dL — ABNORMAL HIGH (ref 70–99)
Sodium: 140 mEq/L (ref 135–145)

## 2013-01-14 LAB — RETICULOCYTES: Retic Count, Absolute: 285.5 10*3/uL — ABNORMAL HIGH (ref 19.0–186.0)

## 2013-01-14 MED ORDER — ACETAMINOPHEN 325 MG PO TABS
650.0000 mg | ORAL_TABLET | Freq: Four times a day (QID) | ORAL | Status: DC | PRN
  Filled 2013-01-14: qty 2

## 2013-01-14 MED ORDER — SODIUM CHLORIDE 0.9 % IJ SOLN
3.0000 mL | Freq: Two times a day (BID) | INTRAMUSCULAR | Status: DC
  Administered 2013-01-14 – 2013-01-16 (×3): 3 mL via INTRAVENOUS

## 2013-01-14 MED ORDER — HYDROMORPHONE HCL PF 2 MG/ML IJ SOLN
2.0000 mg | Freq: Once | INTRAMUSCULAR | Status: AC
  Administered 2013-01-14: 2 mg via INTRAVENOUS
  Filled 2013-01-14: qty 1

## 2013-01-14 MED ORDER — ALUM & MAG HYDROXIDE-SIMETH 200-200-20 MG/5ML PO SUSP: 30.0000 mL | Freq: Four times a day (QID) | ORAL | Status: DC | PRN

## 2013-01-14 MED ORDER — SODIUM CHLORIDE 0.9 % IV SOLN
1.0000 mg/h | INTRAVENOUS | Status: DC
  Administered 2013-01-14 – 2013-01-15 (×2): 1 mg/h via INTRAVENOUS
  Filled 2013-01-14 (×2): qty 5

## 2013-01-14 MED ORDER — DIPHENHYDRAMINE HCL 50 MG/ML IJ SOLN
25.0000 mg | Freq: Once | INTRAMUSCULAR | Status: AC
  Administered 2013-01-14: 25 mg via INTRAVENOUS
  Filled 2013-01-14: qty 1

## 2013-01-14 MED ORDER — HYDROMORPHONE HCL PF 2 MG/ML IJ SOLN
2.0000 mg | INTRAMUSCULAR | Status: DC | PRN
  Administered 2013-01-14 – 2013-01-15 (×4): 2 mg via INTRAVENOUS
  Filled 2013-01-14 (×3): qty 1

## 2013-01-14 MED ORDER — DIPHENHYDRAMINE HCL 25 MG PO CAPS
25.0000 mg | ORAL_CAPSULE | Freq: Four times a day (QID) | ORAL | Status: DC | PRN
  Administered 2013-01-15 (×2): 25 mg via ORAL
  Filled 2013-01-14 (×3): qty 1

## 2013-01-14 MED ORDER — HYDROMORPHONE HCL PF 1 MG/ML IJ SOLN: 0.5000 mg | INTRAMUSCULAR | Status: DC | PRN

## 2013-01-14 MED ORDER — SODIUM CHLORIDE 0.9 % IV SOLN
INTRAVENOUS | Status: DC
  Administered 2013-01-14 – 2013-01-17 (×7): via INTRAVENOUS

## 2013-01-14 MED ORDER — ONDANSETRON HCL 4 MG PO TABS: 4.0000 mg | ORAL_TABLET | Freq: Four times a day (QID) | ORAL | Status: DC | PRN

## 2013-01-14 MED ORDER — ZOLPIDEM TARTRATE 5 MG PO TABS
5.0000 mg | ORAL_TABLET | Freq: Every evening | ORAL | Status: DC | PRN
  Administered 2013-01-15 – 2013-01-16 (×3): 5 mg via ORAL
  Filled 2013-01-14 (×3): qty 1

## 2013-01-14 MED ORDER — DIPHENHYDRAMINE HCL 50 MG/ML IJ SOLN
12.5000 mg | Freq: Once | INTRAMUSCULAR | Status: AC
  Administered 2013-01-14: 12.5 mg via INTRAVENOUS
  Filled 2013-01-14: qty 1

## 2013-01-14 MED ORDER — ONDANSETRON HCL 4 MG/2ML IJ SOLN
4.0000 mg | Freq: Once | INTRAMUSCULAR | Status: AC
  Administered 2013-01-14: 4 mg via INTRAVENOUS
  Filled 2013-01-14: qty 2

## 2013-01-14 MED ORDER — ONDANSETRON HCL 4 MG/2ML IJ SOLN
4.0000 mg | Freq: Four times a day (QID) | INTRAMUSCULAR | Status: DC | PRN
  Administered 2013-01-15: 4 mg via INTRAVENOUS
  Filled 2013-01-14: qty 2

## 2013-01-14 MED ORDER — SODIUM CHLORIDE 0.9 % IV BOLUS (SEPSIS)
1000.0000 mL | Freq: Once | INTRAVENOUS | Status: AC
  Administered 2013-01-14: 1000 mL via INTRAVENOUS

## 2013-01-14 MED ORDER — ACETAMINOPHEN 650 MG RE SUPP: 650.0000 mg | Freq: Four times a day (QID) | RECTAL | Status: DC | PRN

## 2013-01-14 MED ORDER — ASPIRIN EC 325 MG PO TBEC
325.0000 mg | DELAYED_RELEASE_TABLET | Freq: Every day | ORAL | Status: DC
  Administered 2013-01-14 – 2013-01-17 (×4): 325 mg via ORAL
  Filled 2013-01-14 (×5): qty 1

## 2013-01-14 MED ORDER — HYDROMORPHONE HCL PF 1 MG/ML IJ SOLN
1.0000 mg | Freq: Once | INTRAMUSCULAR | Status: AC
  Administered 2013-01-14: 1 mg via INTRAVENOUS
  Filled 2013-01-14: qty 1

## 2013-01-14 MED ORDER — NITROGLYCERIN 2 % TD OINT
0.5000 [in_us] | TOPICAL_OINTMENT | Freq: Four times a day (QID) | TRANSDERMAL | Status: DC
  Administered 2013-01-15 – 2013-01-17 (×9): 0.5 [in_us] via TOPICAL
  Filled 2013-01-14: qty 30

## 2013-01-14 MED ORDER — ENOXAPARIN SODIUM 40 MG/0.4ML ~~LOC~~ SOLN
40.0000 mg | SUBCUTANEOUS | Status: DC
  Administered 2013-01-14 – 2013-01-16 (×3): 40 mg via SUBCUTANEOUS
  Filled 2013-01-14 (×4): qty 0.4

## 2013-01-14 MED ORDER — OXYCODONE HCL 5 MG PO TABS
5.0000 mg | ORAL_TABLET | ORAL | Status: DC | PRN
  Administered 2013-01-15 – 2013-01-16 (×4): 5 mg via ORAL
  Filled 2013-01-14 (×4): qty 1

## 2013-01-14 MED ORDER — SODIUM CHLORIDE 0.9 % IJ SOLN
10.0000 mL | INTRAMUSCULAR | Status: DC | PRN
  Administered 2013-01-16: 30 mL
  Administered 2013-01-17: 10 mL

## 2013-01-14 NOTE — ED Notes (Signed)
Pt unable to void 

## 2013-01-14 NOTE — ED Notes (Signed)
[facility] called to transfer the pt

## 2013-01-14 NOTE — ED Notes (Signed)
Pt returned. From xray.  Iv team called to return

## 2013-01-14 NOTE — ED Notes (Signed)
Iv team called to access the porta cath and draw his blood

## 2013-01-14 NOTE — ED Notes (Signed)
The pthas not had relief from the first dilaudid.  Requested more pain med.  given

## 2013-01-14 NOTE — ED Notes (Signed)
Treatment delayed  Waiting for the iv team to return

## 2013-01-14 NOTE — ED Notes (Signed)
Porta cath accessed by iv team with difficulty lab here to draw.  Iv nss wo and leds given

## 2013-01-14 NOTE — ED Notes (Signed)
Dr jenkins here to see 

## 2013-01-14 NOTE — ED Notes (Signed)
Iv team came pt in xray he will return

## 2013-01-14 NOTE — Progress Notes (Signed)
[facility] progress note. Chief complaint. Transfer note. History of present illness. This 27 year old male admitted through [facility] emergency room with chest pain, low back pain, and leg pain for 2 days thought secondary to sickle cell crisis. The patient was transferred to [facility] long hospital for continued care and has now arrived. I'm seeing the patient at bedside to ensure continued stability post transfer and that orders transferred appropriately. Vital signs temperature 98.2, pulse 113, respiration 20, blood pressure 116/67. O2 sats 94%. Cardiac. Rate and rhythm regular. Lungs. Breath sounds clear and equal. Abdomen. Soft with positive bowel sounds. Extremities. Pain at the hips and shoulders as well as back with range of motion. Impression/plan. Problem #1 sickle cell crisis. Patient initiated on Dilaudid when necessary for pain control. Followup labs for a.m. in place to followup anemia. Problem #2. Chest pain. Plan to cycle troponins, nitro paste every 6 hours and aspirin. Patient appears stable post transfer. All orders appear to transferred appropriately.

## 2013-01-14 NOTE — H&P (Signed)
[facility] History and Physical  Dennis Bernard ZOX:[id number] DOB: 1986/09/12 DOA: 01/14/2013  Referring physician: EDP PCP: No primary provider on file.  Specialists:   Chief Complaint: Chest Low Back and leg Pain  HPI: Dennis Bernard is a 27 y.o. male     27 y.o. male with Sickle Cell disease who presented to [facility] ED on 01/14/2013 with complaints of 10/10 chest Pain , Low Back Pain and leg pain X 2 days.  The pain is described as sharp and achy and intermittent.  He has not had relief with his home pain medications.   He denies having SOB, or diaphoresis, and fevers or chills.  He has had nausea, but no vomiting.   On admission his hemoglobin level is 8.1, and his reticulocyte count is elevated.   He was discharged 2 days ago from [place] with the same complaints.      Review of Systems: The patient denies anorexia, fever, chills, headaches, weight loss, vision loss, decreased hearing, hoarseness, syncope, dyspnea on exertion, peripheral edema, balance deficits, hemoptysis, abdominal pain, melena, hematochezia, severe indigestion/heartburn, hematuria, incontinence, dysuria, muscle weakness, suspicious skin lesions, transient blindness, difficulty walking, depression, unusual weight change, abnormal bleeding, enlarged lymph nodes, angioedema, and breast masses.    Past Medical History  Diagnosis Date  . Sickle cell disease   . Avascular necrosis of femur head, right   . H/O allergic rhinitis   . H/O hypokalemia   . Chronic pain syndrome   . H/O wheezing     with colds     Past Surgical History  Procedure Laterality Date  . Cholecystectomy  1995  . Portacath placement Left 07/04/2012  . Exchange transfusion  02/2008    perioperatively for shoulder surgery  . 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 Authorizing Provider  albuterol (PROVENTIL HFA;VENTOLIN  HFA) 108 (90 BASE) MCG/ACT inhaler Inhale 1 puff into the lungs every 6 (six) hours as needed for wheezing or shortness of breath. wheezing 11/27/12  Yes Vassie Loll, MD  diphenhydrAMINE (BENADRYL) 25 MG tablet Take 25 mg by mouth every 8 (eight) hours as needed. For itching. 08/23/12  Yes Dow Adolph, MD  folic acid (FOLVITE) 1 MG tablet Take 1 mg by mouth every morning. 11/27/12  Yes Vassie Loll, MD  hydroxyurea (HYDREA) 500 MG capsule Take 2,000 mg by mouth every morning. May take with food to minimize GI side effects. 11/27/12  Yes Vassie Loll, MD  morphine (MS CONTIN) 60 MG 12 hr tablet Take 1 tablet (60 mg total) by mouth every 8 (eight) hours. 01/08/13  Yes Rometta Emery, MD  morphine (MSIR) 15 MG tablet Take 1 tablet (15 mg total) by mouth every 4 (four) hours as needed for pain (breakthrough pain). Pain 01/08/13  Yes Rometta Emery, MD  pantoprazole (PROTONIX) 40 MG tablet Take 1 tablet (40 mg total) by mouth daily at 12 noon. 11/27/12  Yes Vassie Loll, MD  promethazine (PHENERGAN) 25 MG tablet Take 1 tablet (25 mg total) by mouth every 6 (six) hours as needed for nausea. 12/12/12  Yes Novlet Adelina Mings, MD  senna-docusate (SENOKOT-S) 8.6-50 MG per tablet Take 1 tablet by mouth daily as needed for constipation.  08/27/12  Yes Linward Headland, MD    Allergies:  No Known Allergies  Social History:   reports that he has never smoked. He has never used smokeless tobacco. He  reports that he does not drink alcohol or use illicit drugs.  Family History: Family History  Problem Relation Age of Onset  . Sickle cell anemia Brother   . Sickle cell trait Father   . Sickle cell trait Mother   . Diabetes Mellitus II Mother   . Sickle cell anemia Paternal Uncle   . Asthma Neg Hx   . Allergic rhinitis Neg Hx      Physical Exam:  GEN:  Pleasant  27 year old well nourished and well developed African American Male examined  and in no acute distress; cooperative with exam Filed Vitals:    01/14/13 1601 01/14/13 1828 01/14/13 1852 01/14/13 1922  BP: 126/67 121/66 111/76   Pulse: 83 92 90   Temp: 98.7 F (37.1 C) 98.4 F (36.9 C)  97.5 F (36.4 C)  TempSrc: Oral     Resp: 20 18 18    SpO2: 93% 100% 100%    Blood pressure 111/76, pulse 90, temperature 97.5 F (36.4 C), temperature source Oral, resp. rate 18, SpO2 100.00%. PSYCH: He is alert and oriented x4; does not appear anxious does not appear depressed; affect is normal HEENT: Normocephalic and Atraumatic, Mucous membranes pink; PERRLA; EOM intact; Fundi:  Benign;  No scleral icterus, Nares: Patent, Oropharynx: Clear, Fair Dentition, Neck:  FROM, no cervical lymphadenopathy nor thyromegaly or carotid bruit; no JVD; Breasts:: Not examined CHEST WALL: No tenderness CHEST: Normal respiration, clear to auscultation bilaterally HEART: Regular rate and rhythm; no murmurs rubs or gallops BACK: No kyphosis or scoliosis; no CVA tenderness ABDOMEN: Positive Bowel Sounds, soft non-tender; no masses, no organomegaly.    Rectal Exam: Not done EXTREMITIES: No cyanosis, clubbing or edema; no ulcerations. Genitalia: not examined PULSES: 2+ and symmetric SKIN: Normal hydration no rash or ulceration CNS: Cranial nerves 2-12 grossly intact no focal neurologic deficit     Labs & Imaging Results for orders placed during the hospital encounter of 01/14/13 (from the past 48 hour(s))  CBC WITH DIFFERENTIAL     Status: Abnormal   Collection Time    01/14/13  5:23 PM      Result Value Range   WBC 11.4 (*) 4.0 - 10.5 K/uL   RBC 2.44 (*) 4.22 - 5.81 MIL/uL   Hemoglobin 8.1 (*) 13.0 - 17.0 g/dL   HCT 47.8 (*) 29.5 - 62.1 %   MCV 94.7  78.0 - 100.0 fL   MCH 33.2  26.0 - 34.0 pg   MCHC 35.1  30.0 - 36.0 g/dL   RDW 30.8 (*) 65.7 - 84.6 %   Platelets 379  150 - 400 K/uL   Neutrophils Relative 41 (*) 43 - 77 %   Neutro Abs 4.6  1.7 - 7.7 K/uL   Lymphocytes Relative 51 (*) 12 - 46 %   Lymphs Abs 5.9 (*) 0.7 - 4.0 K/uL   Monocytes  Relative 6  3 - 12 %   Monocytes Absolute 0.7  0.1 - 1.0 K/uL   Eosinophils Relative 1  0 - 5 %   Eosinophils Absolute 0.1  0.0 - 0.7 K/uL   Basophils Relative 1  0 - 1 %   Basophils Absolute 0.1  0.0 - 0.1 K/uL  COMPREHENSIVE METABOLIC PANEL     Status: Abnormal   Collection Time    01/14/13  5:23 PM      Result Value Range   Sodium 140  135 - 145 mEq/L   Potassium 3.7  3.5 - 5.1 mEq/L   Chloride 105  96 - 112 mEq/L   CO2 24  19 - 32 mEq/L   Glucose, Bld 109 (*) 70 - 99 mg/dL   BUN 13  6 - 23 mg/dL   Creatinine, Ser 6.29  0.50 - 1.35 mg/dL   Calcium 9.0  8.4 - 52.8 mg/dL   Total Protein 7.4  6.0 - 8.3 g/dL   Albumin 4.3  3.5 - 5.2 g/dL   AST 38 (*) 0 - 37 U/L   ALT 18  0 - 53 U/L   Alkaline Phosphatase 96  39 - 117 U/L   Total Bilirubin 1.4 (*) 0.3 - 1.2 mg/dL   GFR calc non Af Amer >90  >90 mL/min   GFR calc Af Amer >90  >90 mL/min   Comment:            The eGFR has been calculated     using the CKD EPI equation.     This calculation has not been     validated in all clinical     situations.     eGFR's persistently     <90 mL/min signify     possible Chronic Kidney Disease.  RETICULOCYTES     Status: Abnormal   Collection Time    01/14/13  5:23 PM      Result Value Range   Retic Ct Pct 11.7 (*) 0.4 - 3.1 %   RBC. 2.44 (*) 4.22 - 5.81 MIL/uL   Retic Count, Manual 285.5 (*) 19.0 - 186.0 K/uL     Radiological Exams on Admission: Dg Chest 2 View  01/14/2013  *RADIOLOGY REPORT*  Clinical Data: Sickle cell pain  CHEST - 2 VIEW  Comparison: 01/07/1939  Findings: Cardiomediastinal silhouette is stable.  Left IJ Port-A- Cath is unchanged in position.  No acute infiltrate or pulmonary edema.  Bony thorax is stable.  IMPRESSION: No active disease.  No significant change.   Original Report Authenticated By: Natasha Mead, M.D.     EKG: Independently reviewed. Sinus Tachycardia, Non-Specific S-T changes  Assessment/Plan Principal Problem:   Sickle cell pain crisis Active  Problems:   Chronic pain   Chest Pain   Leukocytosis   Anemia    1.  Sickle Cell Crisis- Pain Control with IV Dilaudid PRN.     2.  Chest Pain- Cycle Troponins, Nitropaste q 6 hours, and ASA.     3.  Chronic Pain-  PRN Pain Meds.     4.   Leukocytosis- Most Likely Stress Reaction, Monitor trend.    5.   Anemia-  Monitor, transfuse if needed.     6.   DVT prophylaxis with Lovenox.       Code Status:  FULL CODE Family Communication:  No Family at Bedside Disposition Plan:  Return to Home on Discharge  Time spent: 65 Minutes  Ron Parker [facility] Pager [phone]  If 7PM-7AM, please contact night-coverage [email or web address] Password [facility] 01/14/2013, 8:04 PM

## 2013-01-14 NOTE — ED Notes (Signed)
[facility] transporting 

## 2013-01-14 NOTE — ED Notes (Signed)
The pt is waiting for dr Lovell Sheehan to admit and then he will be transferred to [facility] long

## 2013-01-14 NOTE — ED Notes (Signed)
The pt has sickle cell disease and he thinks hes in a crisis.  He has pain over huis body for 2 days nv.  He has a porta cath lt chest..  No temp his home pain meds not helping

## 2013-01-14 NOTE — ED Notes (Signed)
The pt s report caled to [facility] 1515

## 2013-01-14 NOTE — ED Notes (Signed)
The pt cannot void

## 2013-01-14 NOTE — ED Provider Notes (Signed)
History     CSN: [id number]  Arrival date & time 01/14/13  1553   First MD Initiated Contact with Patient 01/14/13 1556      Chief Complaint  Patient presents with  . sickle cell pain over his body     (Consider location/radiation/quality/duration/timing/severity/associated sxs/prior treatment) HPI Comments: Pt states that he is here with sickle cell crisis for the last 2 days:pt states that he is hurting in his lower back, legs and chest:chest is not consistent with his normal crisis:no fever, cough:pt states that he has also been vomiting and has been unable to keep anything down:pt states that he does have a history of acute chest:pt state that he is not sob except when he lays down  The history is provided by the patient. No language interpreter was used.    Past Medical History  Diagnosis Date  . Sickle cell disease   . Avascular necrosis of femur head, right   . H/O allergic rhinitis   . H/O hypokalemia   . Chronic pain syndrome   . H/O wheezing     with colds    Past Surgical History  Procedure Laterality Date  . Cholecystectomy  1995  . Portacath placement Left 07/04/2012  . Exchange transfusion  02/2008    perioperatively for shoulder surgery  . 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 Sickle cell anemia Brother   . Sickle cell trait Father   . Sickle cell trait Mother   . Diabetes Mellitus II Mother   . Sickle cell anemia Paternal Uncle   . Asthma Neg Hx   . Allergic rhinitis Neg Hx     History  Substance Use Topics  . Smoking status: Never Smoker   . Smokeless tobacco: Never Used  . Alcohol Use: No      Review of Systems  Constitutional: Negative for fever.  Respiratory: Negative.   Cardiovascular: Negative.     Allergies  Review of patient's allergies indicates no known allergies.  Home Medications   Current Outpatient Rx  Name  Route  Sig  Dispense  Refill  . albuterol (PROVENTIL  HFA;VENTOLIN HFA) 108 (90 BASE) MCG/ACT inhaler   Inhalation   Inhale 1 puff into the lungs every 6 (six) hours as needed for wheezing or shortness of breath. wheezing   1 Inhaler   1   . diphenhydrAMINE (BENADRYL) 25 MG tablet   Oral   Take 25 mg by mouth every 8 (eight) hours as needed. For itching.         . folic acid (FOLVITE) 1 MG tablet   Oral   Take 1 mg by mouth every morning.         . hydroxyurea (HYDREA) 500 MG capsule   Oral   Take 2,000 mg by mouth every morning. May take with food to minimize GI side effects.         Marland Kitchen morphine (MS CONTIN) 60 MG 12 hr tablet   Oral   Take 1 tablet (60 mg total) by mouth every 8 (eight) hours.   45 tablet   0   . morphine (MSIR) 15 MG tablet   Oral   Take 1 tablet (15 mg total) by mouth every 4 (four) hours as needed for pain (breakthrough pain). Pain   90 tablet   0   . pantoprazole (PROTONIX) 40 MG tablet   Oral   Take 1 tablet (40 mg total) by  mouth daily at 12 noon.   30 tablet   1   . promethazine (PHENERGAN) 25 MG tablet   Oral   Take 1 tablet (25 mg total) by mouth every 6 (six) hours as needed for nausea.   30 tablet   0   . senna-docusate (SENOKOT-S) 8.6-50 MG per tablet   Oral   Take 1 tablet by mouth daily as needed for constipation.            BP 126/67  Pulse 83  Temp(Src) 98.7 F (37.1 C) (Oral)  Resp 20  SpO2 93%  Physical Exam  Nursing note and vitals reviewed. Constitutional: He is oriented to person, place, and time. He appears well-developed and well-nourished.  HENT:  Head: Normocephalic and atraumatic.  Eyes: Conjunctivae and EOM are normal. Scleral icterus is present.  Neck: Normal range of motion.  Cardiovascular: Tachycardia present.   Abdominal: Soft. Bowel sounds are normal. There is no tenderness.  Musculoskeletal: Normal range of motion.  Neurological: He is alert and oriented to person, place, and time.  Skin: Skin is warm and dry.  Psychiatric: He has a normal mood  and affect.    ED Course  Procedures (including critical care time)  Labs Reviewed  CBC WITH DIFFERENTIAL - Abnormal; Notable for the following:    WBC 11.4 (*)    RBC 2.44 (*)    Hemoglobin 8.1 (*)    HCT 23.1 (*)    RDW 17.8 (*)    Neutrophils Relative 41 (*)    Lymphocytes Relative 51 (*)    Lymphs Abs 5.9 (*)    All other components within normal limits  COMPREHENSIVE METABOLIC PANEL - Abnormal; Notable for the following:    Glucose, Bld 109 (*)    AST 38 (*)    Total Bilirubin 1.4 (*)    All other components within normal limits  RETICULOCYTES - Abnormal; Notable for the following:    Retic Ct Pct 11.7 (*)    RBC. 2.44 (*)    Retic Count, Manual 285.5 (*)    All other components within normal limits  TROPONIN I  URINALYSIS, ROUTINE W REFLEX MICROSCOPIC   No results found.  Date: 01/14/2013  Rate:122  Rhythm: sinus tachycardia  QRS Axis: normal  Intervals: normal  ST/T Wave abnormalities: nonspecific T wave changes  Conduction Disutrbances:none  Narrative Interpretation: lvh noted   Old EKG Reviewed: unchanged    1. Sickle cell pain crisis       MDM  Pt states that his leg and back pain have not resolved:pt is going to be seen by Dr. Lovell Sheehan and then transferred to [facility], NP 01/14/13 [phone]

## 2013-01-15 DIAGNOSIS — D5700 Hb-SS disease with crisis, unspecified: Principal | ICD-10-CM

## 2013-01-15 LAB — URINALYSIS, ROUTINE W REFLEX MICROSCOPIC
Bilirubin Urine: NEGATIVE
Glucose, UA: NEGATIVE mg/dL
Ketones, ur: NEGATIVE mg/dL
Nitrite: NEGATIVE
pH: 5.5 (ref 5.0–8.0)

## 2013-01-15 LAB — TROPONIN I
Troponin I: <0.3 ng/mL (ref <0.30)
Troponin I: <0.3 ng/mL (ref <0.30)
Troponin I: <0.3 ng/mL (ref <0.30)

## 2013-01-15 LAB — BASIC METABOLIC PANEL
Calcium: 8.6 mg/dL (ref 8.4–10.5)
GFR calc Af Amer: >90 mL/min (ref >90)
GFR calc non Af Amer: >90 mL/min (ref >90)
Glucose, Bld: 98 mg/dL (ref 70–99)
Potassium: 3.8 mEq/L (ref 3.5–5.1)
Sodium: 140 mEq/L (ref 135–145)

## 2013-01-15 LAB — PREPARE RBC (CROSSMATCH)

## 2013-01-15 LAB — CBC
Hemoglobin: 7.1 g/dL — ABNORMAL LOW (ref 13.0–17.0)
Platelets: 373 10*3/uL (ref 150–400)
RBC: 2.18 MIL/uL — ABNORMAL LOW (ref 4.22–5.81)
WBC: 9.3 10*3/uL (ref 4.0–10.5)

## 2013-01-15 MED ORDER — HYDROMORPHONE HCL PF 1 MG/ML IJ SOLN
1.0000 mg | INTRAMUSCULAR | Status: DC | PRN
  Administered 2013-01-15: 2 mg via INTRAVENOUS
  Filled 2013-01-15: qty 2

## 2013-01-15 MED ORDER — DIPHENHYDRAMINE HCL 50 MG/ML IJ SOLN
25.0000 mg | Freq: Four times a day (QID) | INTRAMUSCULAR | Status: DC | PRN
  Administered 2013-01-15 – 2013-01-17 (×6): 25 mg via INTRAVENOUS
  Filled 2013-01-15 (×7): qty 1

## 2013-01-15 MED ORDER — KETOROLAC TROMETHAMINE 30 MG/ML IJ SOLN
30.0000 mg | Freq: Four times a day (QID) | INTRAMUSCULAR | Status: DC
  Administered 2013-01-15 – 2013-01-17 (×8): 30 mg via INTRAVENOUS
  Filled 2013-01-15 (×15): qty 1

## 2013-01-15 MED ORDER — HYDROMORPHONE HCL PF 2 MG/ML IJ SOLN
4.0000 mg | INTRAMUSCULAR | Status: DC | PRN
  Administered 2013-01-15 – 2013-01-17 (×19): 4 mg via INTRAVENOUS
  Filled 2013-01-15 (×19): qty 2

## 2013-01-15 NOTE — Progress Notes (Signed)
Blood bank called to acknowledge that rare blood for patient could not be obtained at this time. Dr.Crosley attending has been notified and provided contact information. Called received from [place] @ [phone]

## 2013-01-15 NOTE — Progress Notes (Signed)
UR Completed.  Fawna Cranmer Jane [phone] 01/15/2013  

## 2013-01-15 NOTE — Progress Notes (Signed)
Subjective: Patient seen, states pain is improved, currently 7/10 as opposed to 10/10 at admission. Pain is in his chest, back and legs. Patient states these are typical areas for his sickle cell pain. He request a bit more dilaudid, stating he usually gets 4mg  PRN.   Objective: Weight change:   Intake/Output Summary (Last 24 hours) at 01/15/13 1501 Last data filed at 01/15/13 0600  Gross per 24 hour  Intake      0 ml  Output   1000 ml  Net  -1000 ml   BP 106/68  Pulse 76  Temp(Src) 98.1 F (36.7 C) (Oral)  Resp 18  Ht 5\' 9"  (1.753 m)  Wt 81.375 kg (179 lb 6.4 oz)  BMI 26.48 kg/m2  SpO2 95%  General Appearance:    Alert, cooperative, no distress, appears stated age  Head:    Normocephalic, without obvious abnormality, atraumatic  Eyes:    PERRL     Nose:   Nares normal, septum midline, mucosa normal, no drainage    or sinus tenderness  Throat:   Lips, mucosa, and tongue normal; teeth and gums normal  Neck:   Supple, symmetrical, trachea midline, no adenopathy;       thyroid:  No enlargement/tenderness/nodules; no carotid   bruit or JVD  Back:     Symmetric, no curvature, ROM normal, no CVA tenderness  Lungs:     Clear to auscultation bilaterally, respirations unlabored  Chest wall:    No tenderness or deformity  Heart:    Regular rate and rhythm, S1 and S2 normal, no murmur, rub   or gallop  Abdomen:     Soft, non-tender, bowel sounds active all four quadrants,    no masses, no organomegaly        Extremities:   Extremities normal, atraumatic, no cyanosis or edema  Pulses:   2+ and symmetric all extremities  Skin:   Skin color, texture, turgor normal, no rashes or lesions     Neurologic:   CNII-XII intact. Normal strength, sensation and reflexes      throughout    Lab Results:  Recent Labs  01/14/13 1723 01/15/13 0520  NA 140 140  K 3.7 3.8  CL 105 107  CO2 24 26  GLUCOSE 109* 98  BUN 13 8  CREATININE 0.74 0.66  CALCIUM 9.0 8.6    Recent Labs   01/14/13 1723  AST 38*  ALT 18  ALKPHOS 96  BILITOT 1.4*  PROT 7.4  ALBUMIN 4.3   No results found for this basename: LIPASE, AMYLASE,  in the last 72 hours  Recent Labs  01/14/13 1723 01/15/13 0520  WBC 11.4* 9.3  NEUTROABS 4.6  --   HGB 8.1* 7.1*  HCT 23.1* 21.3*  MCV 94.7 97.7  PLT 379 373    Recent Labs  01/14/13 2345 01/15/13 0520 01/15/13 1145  TROPONINI <0.30 <0.30 <0.30    Recent Labs  01/14/13 1723  RETICCTPCT 11.7*    Micro Results: Results for Dennis Bernard, Dennis Bernard (MRN [id number]) as of 01/15/2013 15:02  Ref. Range 01/15/2013 00:25  Color, Urine Latest Range: YELLOW  YELLOW  APPearance Latest Range: CLEAR  CLEAR  Specific Gravity, Urine Latest Range: 1.005-1.030  1.014  pH Latest Range: 5.0-8.0  5.5  Glucose Latest Range: NEGATIVE mg/dL NEGATIVE  Bilirubin Urine Latest Range: NEGATIVE  NEGATIVE  Ketones, ur Latest Range: NEGATIVE mg/dL NEGATIVE  Protein Latest Range: NEGATIVE mg/dL NEGATIVE  Urobilinogen, UA Latest Range: 0.0-1.0 mg/dL 1.0  Nitrite Latest Range: NEGATIVE  NEGATIVE  Leukocytes, UA Latest Range: NEGATIVE  NEGATIVE  Hgb urine dipstick Latest Range: NEGATIVE  NEGATIVE    Studies/Results: Dg Chest 2 View  01/14/2013  *RADIOLOGY REPORT*  Clinical Data: Sickle cell pain  CHEST - 2 VIEW  Comparison: 01/07/1939  Findings: Cardiomediastinal silhouette is stable.  Left IJ Port-A- Cath is unchanged in position.  No acute infiltrate or pulmonary edema.  Bony thorax is stable.  IMPRESSION: No active disease.  No significant change.   Original Report Authenticated By: Dennis Bernard, M.D.    Dg Chest 2 View  01/06/2013  *RADIOLOGY REPORT*  Clinical Data: Sickle cell crisis  CHEST - 2 VIEW  Comparison: December 24, 2012.  Findings: Status post left shoulder arthroplasty.  Left internal jugular Port-A-Cath is unchanged in position.  Cardiomediastinal silhouette appears normal.  No pneumothorax or pleural effusion is noted.  No acute pulmonary disease is seen.   IMPRESSION: No acute cardiopulmonary abnormality seen.   Original Report Authenticated By: Dennis Bernard.,  M.D.    EKG: NSR  Medications: Scheduled Meds: . aspirin EC  325 mg Oral Daily  . enoxaparin (LOVENOX) injection  40 mg Subcutaneous Q24H  . ketorolac  30 mg Intravenous Q6H  . nitroGLYCERIN  0.5 inch Topical Q6H  . sodium chloride  3 mL Intravenous Q12H   Continuous Infusions: . sodium chloride 100 mL/hr at 01/15/13 1413   PRN Meds:.acetaminophen, acetaminophen, alum & mag hydroxide-simeth, diphenhydrAMINE, HYDROmorphone (DILAUDID) injection, ondansetron (ZOFRAN) IV, ondansetron, oxyCODONE, sodium chloride, zolpidem  Assessment/Plan: @PROBHOSP @  LOS: 1 day   Sickle Cell Crisis with anemia- Pain Control with IV Dilaudid PRN, dose increased to 4mg  PRN. Scheduled toradol, K pad and IVF. Transfusion of 1 unit PRBC ordered. Patient IS difficult to type and cross match, [employer] contacted by blood bank to arrange appropriate crossmatching.   Chest Pain- typical for his sickle cell crisis,Troponins negative. D/c Nitropaste. Chronic Pain- PRN Pain Meds.  Leukocytosis- Most Likely Stress Reaction, WBC normalized.    DVT prophylaxis with Lovenox.  Code Status: FULL CODE  Family Communication: No Family at Bedside     Dennis Bernard 01/15/2013, 3:01 PM

## 2013-01-15 NOTE — Progress Notes (Signed)
Attending, Claiborne Billings, notified of pt's BP 104/52, no new orders given. Will continue to monitor pt and provide appropriate interventions.

## 2013-01-15 NOTE — ED Provider Notes (Signed)
Medical screening examination/treatment/procedure(s) were performed by non-physician practitioner and as supervising physician I was immediately available for consultation/collaboration.  Juliet Rude. Rubin Payor, MD 01/15/13 1102

## 2013-01-16 LAB — CBC WITH DIFFERENTIAL/PLATELET
Band Neutrophils: 0 % (ref 0–10)
Basophils Absolute: 0 10*3/uL (ref 0.0–0.1)
Basophils Relative: 0 % (ref 0–1)
HCT: 21.6 % — ABNORMAL LOW (ref 39.0–52.0)
Hemoglobin: 7.5 g/dL — ABNORMAL LOW (ref 13.0–17.0)
Lymphocytes Relative: 62 % — ABNORMAL HIGH (ref 12–46)
Lymphs Abs: 4.3 10*3/uL — ABNORMAL HIGH (ref 0.7–4.0)
MCH: 33.6 pg (ref 26.0–34.0)
MCHC: 34.7 g/dL (ref 30.0–36.0)
MCV: 96.9 fL (ref 78.0–100.0)
Metamyelocytes Relative: 0 %
Promyelocytes Absolute: 0 %
RDW: 17.5 % — ABNORMAL HIGH (ref 11.5–15.5)

## 2013-01-16 MED ORDER — MORPHINE SULFATE ER 30 MG PO TBCR
60.0000 mg | EXTENDED_RELEASE_TABLET | Freq: Two times a day (BID) | ORAL | Status: DC
  Administered 2013-01-16 – 2013-01-17 (×3): 60 mg via ORAL
  Filled 2013-01-16 (×3): qty 2

## 2013-01-16 NOTE — Progress Notes (Signed)
SICKLE CELL SERVICE PROGRESS NOTE  Kru Allman ZOX:[id number] DOB: [birth year] DOA: 01/14/2013 PCP: No primary provider on file.  Assessment/Plan: Principal Problem:   Sickle cell pain crisis: Pt states that pain not that bad and that he is hoping to get out of the hospital by tomorrow as his finals are this week. He rates his pain as 6/10. Will resume MS Contin at 60 mg q 12 hours. Apparently his long acting medication was increased during a hospitalization by Dr. Earlene Plater. This will be decreased back to BID as it is inappropriate to increase the extended release medication during an acte pain episode.   Anemia: Pt has a mild decrease in Hb to 7.1. He is presently asymptomatic and pain has improved. Pt also has a known antibody I and is at risk for hemolysis and thus should not be transfused. Transfusion cancelled.  SCD: Pt acknowledges that he is more compliant with takling Hydrea and has seen positive results.     Chronic pain: Continue MS Contin 60 mg BID.  Code Status: Full Code Family Communication: N/A Disposition Plan: Anticipate discharge tomorrow.   Ayza Ripoll A.  Pager [phone]. If 7PM-7AM, please contact night-coverage.  01/16/2013, 6:14 PM  LOS: 2 days   Brief narrative: 27 y.o. male with Sickle Cell disease who presented to [facility] ED on 01/14/2013 with complaints of 10/10 chest Pain , Low Back Pain and leg pain X 2 days. The pain is described as sharp and achy and intermittent. He has not had relief with his home pain medications. He denies having SOB, or diaphoresis, and fevers or chills. He has had nausea, but no vomiting. On admission his hemoglobin level is 8.1, and his reticulocyte count is elevated. He was discharged 2 days ago from [facility] with the same complaints.      Consultants:  None  Procedures:  None  Antibiotics:  None  HPI/Subjective: Pt states that pain not that bad and that he is hoping to get out of the hospital by tomorrow as his  finals are this week. He rates his pain as 6/10. Pain is localized to back and legs.   Objective: Filed Vitals:   01/16/13 0200 01/16/13 0600 01/16/13 1014 01/16/13 1442  BP: 111/72 109/73 114/70 106/68  Pulse: 76 72 81 86  Temp: 98.1 F (36.7 C) 98.6 F (37 C) 98.2 F (36.8 C) 97.8 F (36.6 C)  TempSrc: Oral Oral Oral Oral  Resp: 18 18 16 18   Height:      Weight:      SpO2: 95% 95% 97% 96%   Weight change:   Intake/Output Summary (Last 24 hours) at 01/16/13 1814 Last data filed at 01/16/13 1443  Gross per 24 hour  Intake    240 ml  Output    800 ml  Net   -560 ml    General: Alert, awake, oriented x3, in no acute distress.  HEENT: [place]/AT PEERL, EOMI, Anicteric. Neck: Trachea midline,  no masses, no thyromegal,y no JVD, no carotid bruit OROPHARYNX:  Moist, No exudate/ erythema/lesions.  Heart: Regular rate and rhythm, without murmurs, rubs, gallops, PMI non-displaced, no heaves or thrills on palpation.  Lungs: Clear to auscultation, no wheezing or rhonchi noted. Abdomen: Soft, nontender, nondistended, positive bowel sounds, no masses no hepatosplenomegaly noted.  Neuro: No focal neurological deficits noted cranial nerves II through XII grossly intact. Strength functional in bilateral upper and lower extremities. Musculoskeletal: No warm swelling or erythema around joints. Psychiatric: Patient alert and oriented x3, good insight and  cognition, good recent to remote recall. al axillary or inguinal lymphadenopathy noted.   Data Reviewed: Basic Metabolic Panel:  Recent Labs Lab 01/14/13 1723 01/15/13 0520  NA 140 140  K 3.7 3.8  CL 105 107  CO2 24 26  GLUCOSE 109* 98  BUN 13 8  CREATININE 0.74 0.66  CALCIUM 9.0 8.6   Liver Function Tests:  Recent Labs Lab 01/14/13 1723  AST 38*  ALT 18  ALKPHOS 96  BILITOT 1.4*  PROT 7.4  ALBUMIN 4.3   No results found for this basename: LIPASE, AMYLASE,  in the last 168 hours No results found for this basename:  AMMONIA,  in the last 168 hours CBC:  Recent Labs Lab 01/14/13 1723 01/15/13 0520 01/16/13 1427  WBC 11.4* 9.3 6.9  NEUTROABS 4.6  --  2.3  HGB 8.1* 7.1* 7.5*  HCT 23.1* 21.3* 21.6*  MCV 94.7 97.7 96.9  PLT 379 373 390   Cardiac Enzymes:  Recent Labs Lab 01/14/13 2345 01/15/13 0520 01/15/13 1145  TROPONINI <0.30 <0.30 <0.30   BNP (last 3 results) No results found for this basename: PROBNP,  in the last 8760 hours CBG: No results found for this basename: GLUCAP,  in the last 168 hours  No results found for this or any previous visit (from the past 240 hour(s)).   Studies: Dg Chest 2 View  01/14/2013  *RADIOLOGY REPORT*  Clinical Data: Sickle cell pain  CHEST - 2 VIEW  Comparison: 01/07/1939  Findings: Cardiomediastinal silhouette is stable.  Left IJ Port-A- Cath is unchanged in position.  No acute infiltrate or pulmonary edema.  Bony thorax is stable.  IMPRESSION: No active disease.  No significant change.   Original Report Authenticated By: Natasha Mead, M.D.    Dg Chest 2 View  01/06/2013  *RADIOLOGY REPORT*  Clinical Data: Sickle cell crisis  CHEST - 2 VIEW  Comparison: December 24, 2012.  Findings: Status post left shoulder arthroplasty.  Left internal jugular Port-A-Cath is unchanged in position.  Cardiomediastinal silhouette appears normal.  No pneumothorax or pleural effusion is noted.  No acute pulmonary disease is seen.  IMPRESSION: No acute cardiopulmonary abnormality seen.   Original Report Authenticated By: Lupita Raider.,  M.D.    Dg Chest 2 View  12/23/2012  *RADIOLOGY REPORT*  Clinical Data: Chest pain, sickle cell pain crisis  CHEST - 2 VIEW  Comparison: Prior chest x-ray 12/09/2012  Findings: Left IJ approach dual lumen portacatheter with the tip at the superior cavoatrial junction.  Inspiratory volumes are low. Mild patchy opacity in the right base seen on the frontal view. Cardiac and mediastinal contours within normal limits.  No pleural effusion or pneumothorax.   Surgical changes of prior left shoulder arthroplasty.  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  Characteristic vertebral body endplate changes consistent with the patient's clinical history of sickle cell anemia.  IMPRESSION:  1.  Patchy right basilar opacity may reflect atelectasis in the setting of low lung volumes, or infiltrate in the setting of sickle cell pain crisis. 2.  Stable position of left IJ approach portacatheter. 3.  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   Original Report Authenticated By: Malachy Moan, M.D.    Dg Chest Port 1 View  12/24/2012  *RADIOLOGY REPORT*  Clinical Data: 1-day history of sternal chest pain.  Current history of sickle cell disease.  PORTABLE CHEST - 1 VIEW 12/24/2012 1314 hours:  Comparison:  Two-view chest x-ray 12/23/2012, 11/22/2012, 11/10/2012, 09/16/2012, 08/16/2012.  Findings: Cardiac silhouette upper normal in size for AP portable technique, unchanged.  Hilar and mediastinal contours unremarkable. Lungs clear.  Bronchovascular markings normal.  Pulmonary vascularity normal.  No pneumothorax.  No pleural effusions.  Prior left shoulder arthroplasty with anatomic alignment.  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  Left jugular Port-A-Cath tip projects over the lower SVC.  IMPRESSION: No acute cardiopulmonary disease.   Original Report Authenticated By: Hulan Saas, M.D.     Scheduled Meds: . aspirin EC  325 mg Oral Daily  . enoxaparin (LOVENOX) injection  40 mg Subcutaneous Q24H  . ketorolac  30 mg Intravenous Q6H  . morphine  60 mg Oral Q12H  . nitroGLYCERIN  0.5 inch Topical Q6H  . sodium chloride  3 mL Intravenous Q12H   Continuous Infusions: . sodium chloride 100 mL/hr at 01/16/13 0951    Time spent 35 minutes.

## 2013-01-17 LAB — CBC WITH DIFFERENTIAL/PLATELET
Eosinophils Absolute: 0.2 10*3/uL (ref 0.0–0.7)
Eosinophils Relative: 2 % (ref 0–5)
Lymphocytes Relative: 60 % — ABNORMAL HIGH (ref 12–46)
MCH: 32.4 pg (ref 26.0–34.0)
MCHC: 33.5 g/dL (ref 30.0–36.0)
Monocytes Absolute: 0.6 10*3/uL (ref 0.1–1.0)
Neutrophils Relative %: 29 % — ABNORMAL LOW (ref 43–77)
Platelets: 333 10*3/uL (ref 150–400)
RBC: 2.16 MIL/uL — ABNORMAL LOW (ref 4.22–5.81)

## 2013-01-17 LAB — COMPREHENSIVE METABOLIC PANEL
ALT: 14 U/L (ref 0–53)
AST: 29 U/L (ref 0–37)
Albumin: 3.4 g/dL — ABNORMAL LOW (ref 3.5–5.2)
Calcium: 8.4 mg/dL (ref 8.4–10.5)
Potassium: 3.8 mEq/L (ref 3.5–5.1)
Sodium: 140 mEq/L (ref 135–145)
Total Protein: 6 g/dL (ref 6.0–8.3)

## 2013-01-17 LAB — TYPE AND SCREEN
ABO/RH(D): AB POS
Antibody Screen: POSITIVE

## 2013-01-17 MED ORDER — HEPARIN SOD (PORK) LOCK FLUSH 100 UNIT/ML IV SOLN
INTRAVENOUS | Status: AC
  Administered 2013-01-17: 500 [IU]
  Filled 2013-01-17: qty 5

## 2013-01-17 MED ORDER — MORPHINE SULFATE 15 MG PO TABS: 15.0000 mg | ORAL_TABLET | ORAL | Status: DC | PRN

## 2013-01-17 MED ORDER — MORPHINE SULFATE ER 60 MG PO TBCR: 60.0000 mg | EXTENDED_RELEASE_TABLET | Freq: Two times a day (BID) | ORAL | Status: DC

## 2013-01-17 MED ORDER — IBUPROFEN 800 MG PO TABS: 800.0000 mg | ORAL_TABLET | Freq: Three times a day (TID) | ORAL | Status: DC | PRN
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
## 2013-01-19 ENCOUNTER — Emergency Department (HOSPITAL_COMMUNITY)
Admission: EM | Admit: 2013-01-19 | Discharge: 2013-01-19 | Disposition: A | Discharge status: Home / Self Care | Payer: Medicare Other | Attending: Emergency Medicine | Admitting: Emergency Medicine

## 2013-01-19 ENCOUNTER — Telehealth (HOSPITAL_COMMUNITY): Payer: Self-pay | Admitting: *Deleted

## 2013-01-19 ENCOUNTER — Emergency Department: Payer: Self-pay | Admitting: Unknown Physician Specialty

## 2013-01-19 ENCOUNTER — Emergency Department (HOSPITAL_COMMUNITY): Payer: Medicare Other

## 2013-01-19 ENCOUNTER — Encounter (HOSPITAL_COMMUNITY): Payer: Self-pay

## 2013-01-19 DIAGNOSIS — D571 Sickle-cell disease without crisis: Secondary | ICD-10-CM | POA: Insufficient documentation

## 2013-01-19 DIAGNOSIS — M549 Dorsalgia, unspecified: Secondary | ICD-10-CM | POA: Insufficient documentation

## 2013-01-19 DIAGNOSIS — Z8739 Personal history of other diseases of the musculoskeletal system and connective tissue: Secondary | ICD-10-CM | POA: Insufficient documentation

## 2013-01-19 DIAGNOSIS — Z79899 Other long term (current) drug therapy: Secondary | ICD-10-CM | POA: Insufficient documentation

## 2013-01-19 LAB — RETICULOCYTES: Retic Ct Pct: 10.6 % — ABNORMAL HIGH (ref 0.4–3.1)

## 2013-01-19 LAB — CBC WITH DIFFERENTIAL/PLATELET
Eosinophils Relative: 0 % (ref 0–5)
HCT: 23.8 % — ABNORMAL LOW (ref 39.0–52.0)
Lymphs Abs: 4.2 10*3/uL — ABNORMAL HIGH (ref 0.7–4.0)
MCH: 31.5 pg (ref 26.0–34.0)
MCV: 96 fL (ref 78.0–100.0)
Monocytes Absolute: 0.7 10*3/uL (ref 0.1–1.0)
Neutro Abs: 4.9 10*3/uL (ref 1.7–7.7)
Platelets: 360 10*3/uL (ref 150–400)
RBC: 2.48 MIL/uL — ABNORMAL LOW (ref 4.22–5.81)
RDW: 16.5 % — ABNORMAL HIGH (ref 11.5–15.5)

## 2013-01-19 LAB — BASIC METABOLIC PANEL
BUN: 7 mg/dL (ref 6–23)
CO2: 20 mEq/L (ref 19–32)
Calcium: 8.7 mg/dL (ref 8.4–10.5)
Creatinine, Ser: 0.67 mg/dL (ref 0.50–1.35)
GFR calc non Af Amer: >90 mL/min (ref >90)
Glucose, Bld: 105 mg/dL — ABNORMAL HIGH (ref 70–99)
Sodium: 138 mEq/L (ref 135–145)

## 2013-01-19 LAB — LACTATE DEHYDROGENASE: LDH: 310 U/L — ABNORMAL HIGH (ref 94–250)

## 2013-01-19 MED ORDER — SODIUM CHLORIDE 0.9 % IV SOLN
Freq: Once | INTRAVENOUS | Status: AC
  Administered 2013-01-19: 15:00:00 via INTRAVENOUS

## 2013-01-19 MED ORDER — HYDROMORPHONE HCL PF 1 MG/ML IJ SOLN
1.0000 mg | Freq: Once | INTRAMUSCULAR | Status: AC
  Administered 2013-01-19: 1 mg via INTRAVENOUS
  Filled 2013-01-19: qty 1

## 2013-01-19 MED ORDER — SODIUM CHLORIDE 0.9 % IV BOLUS (SEPSIS)
1000.0000 mL | Freq: Once | INTRAVENOUS | Status: AC
  Administered 2013-01-19: 1000 mL via INTRAVENOUS

## 2013-01-19 MED ORDER — ONDANSETRON HCL 4 MG/2ML IJ SOLN
4.0000 mg | Freq: Once | INTRAMUSCULAR | Status: AC
  Administered 2013-01-19: 4 mg via INTRAVENOUS
  Filled 2013-01-19: qty 2

## 2013-01-19 MED ORDER — HYDROMORPHONE HCL 2 MG PO TABS: 2.0000 mg | ORAL_TABLET | Freq: Four times a day (QID) | ORAL | Status: DC | PRN

## 2013-01-19 MED ORDER — HYDROMORPHONE HCL PF 2 MG/ML IJ SOLN
2.0000 mg | Freq: Once | INTRAMUSCULAR | Status: AC
  Administered 2013-01-19: 2 mg via INTRAVENOUS
  Filled 2013-01-19: qty 1

## 2013-01-19 MED ORDER — HEPARIN SOD (PORK) LOCK FLUSH 100 UNIT/ML IV SOLN
INTRAVENOUS | Status: AC
  Administered 2013-01-19: 500 [IU]
  Filled 2013-01-19: qty 5

## 2013-01-19 MED ORDER — DIPHENHYDRAMINE HCL 50 MG/ML IJ SOLN
25.0000 mg | Freq: Once | INTRAMUSCULAR | Status: AC
  Administered 2013-01-19: 25 mg via INTRAVENOUS
  Filled 2013-01-19: qty 1

## 2013-01-19 NOTE — ED Notes (Signed)
Patient reports his pain a 9 prior to 1mg  Dilaudid given.

## 2013-01-19 NOTE — ED Notes (Signed)
Pt presents with c/o sickle cell crisis and pain that began a few days ago. Pt says he has chest pain that started two days ago. Pain is in the center of his chest and is constant. Pt is also complaining of pain in his lower back.

## 2013-01-19 NOTE — ED Provider Notes (Addendum)
History     CSN: [id number]  Arrival date & time 01/19/13  1332   First MD Initiated Contact with Patient 01/19/13 1341      Chief Complaint  Patient presents with  . Sickle Cell Pain Crisis  . Chest Pain    (Consider location/radiation/quality/duration/timing/severity/associated sxs/prior treatment) HPI Comments: Pt comes in with cc of chest pain, back pain. Pt has hx of sickle cell anemia and acute chest in the past. States that the pain is not the same as Acute Chest. He was just discharged from the hospital with the same complain recently. States that his pain is persistent, despite him taking his pain meds. He has no cough, dib, n/v/f/c. Does state that he has an appt next week, and that he has run out of his pain meds now.   Patient is a 27 y.o. male presenting with sickle cell pain and chest pain. The history is provided by the patient.  Sickle Cell Pain Crisis  Associated symptoms include chest pain and back pain. Pertinent negatives include no abdominal pain, no dysuria and no cough.  Chest Pain Associated symptoms: back pain   Associated symptoms: no abdominal pain, no cough and no shortness of breath     Past Medical History  Diagnosis Date  . Sickle cell disease   . Avascular necrosis of femur head, right   . H/O allergic rhinitis   . H/O hypokalemia   . Chronic pain syndrome   . H/O wheezing     with colds    Past Surgical History  Procedure Laterality Date  . Cholecystectomy  1995  . Portacath placement Left 07/04/2012  . Exchange transfusion  02/2008    perioperatively for shoulder surgery  . 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 Sickle cell anemia Brother   . Sickle cell trait Father   . Sickle cell trait Mother   . Diabetes Mellitus II Mother   . Sickle cell anemia Paternal Uncle   . Asthma Neg Hx   . Allergic rhinitis Neg Hx     History  Substance Use Topics  . Smoking status: Never  Smoker   . Smokeless tobacco: Never Used  . Alcohol Use: No      Review of Systems  Constitutional: Negative for activity change and appetite change.  Respiratory: Negative for cough and shortness of breath.   Cardiovascular: Positive for chest pain.  Gastrointestinal: Negative for abdominal pain.  Genitourinary: Negative for dysuria.  Musculoskeletal: Positive for back pain.    Allergies  Review of patient's allergies indicates no known allergies.  Home Medications   Current Outpatient Rx  Name  Route  Sig  Dispense  Refill  . albuterol (PROVENTIL HFA;VENTOLIN HFA) 108 (90 BASE) MCG/ACT inhaler   Inhalation   Inhale 1 puff into the lungs every 6 (six) hours as needed for wheezing or shortness of breath. wheezing   1 Inhaler   1   . diphenhydrAMINE (BENADRYL) 25 MG tablet   Oral   Take 25 mg by mouth every 8 (eight) hours as needed. For itching.         . folic acid (FOLVITE) 1 MG tablet   Oral   Take 1 mg by mouth every morning.         . hydroxyurea (HYDREA) 500 MG capsule   Oral   Take 2,000 mg by mouth every morning. May take with food to minimize GI side effects.         Marland Kitchen  morphine (MS CONTIN) 60 MG 12 hr tablet   Oral   Take 1 tablet (60 mg total) by mouth every 12 (twelve) hours.   14 tablet   0   . morphine (MSIR) 15 MG tablet   Oral   Take 1 tablet (15 mg total) by mouth every 4 (four) hours as needed for pain (breakthrough pain). Pain   15 tablet   0   . pantoprazole (PROTONIX) 40 MG tablet   Oral   Take 1 tablet (40 mg total) by mouth daily at 12 noon.   30 tablet   1   . promethazine (PHENERGAN) 25 MG tablet   Oral   Take 1 tablet (25 mg total) by mouth every 6 (six) hours as needed for nausea.   30 tablet   0   . senna-docusate (SENOKOT-S) 8.6-50 MG per tablet   Oral   Take 1 tablet by mouth daily as needed for constipation.            BP 122/85  Pulse 94  Temp(Src) 98.9 F (37.2 C) (Oral)  Resp 18  Ht 5\' 9"  (1.753 m)   Wt 170 lb (77.111 kg)  BMI 25.09 kg/m2  SpO2 98%  Physical Exam  Nursing note and vitals reviewed. Constitutional: He is oriented to person, place, and time. He appears well-developed.  HENT:  Head: Normocephalic and atraumatic.  Eyes: Conjunctivae and EOM are normal. Pupils are equal, round, and reactive to light.  Neck: Normal range of motion. Neck supple.  Cardiovascular: Normal rate and regular rhythm.   Pulmonary/Chest: Effort normal and breath sounds normal.  Abdominal: Soft. Bowel sounds are normal. He exhibits no distension. There is no tenderness. There is no rebound and no guarding.  Neurological: He is alert and oriented to person, place, and time.  Skin: Skin is warm.    ED Course  Procedures (including critical care time)  Labs Reviewed  CBC WITH DIFFERENTIAL - Abnormal; Notable for the following:    RBC 2.48 (*)    Hemoglobin 7.8 (*)    HCT 23.8 (*)    RDW 16.5 (*)    Lymphs Abs 4.2 (*)    All other components within normal limits  BASIC METABOLIC PANEL - Abnormal; Notable for the following:    Potassium 3.4 (*)    Glucose, Bld 105 (*)    All other components within normal limits  RETICULOCYTES - Abnormal; Notable for the following:    Retic Ct Pct 10.6 (*)    RBC. 2.48 (*)    Retic Count, Manual 262.9 (*)    All other components within normal limits  LACTATE DEHYDROGENASE - Abnormal; Notable for the following:    LDH 310 (*)    All other components within normal limits  TROPONIN I   Dg Chest 2 View  01/19/2013  *RADIOLOGY REPORT*  Clinical Data: Chest pain  CHEST - 2 VIEW  Comparison: 01/14/13  Findings: Cardiomediastinal silhouette is stable.  Stable left IJ Port-A-Cath position.  No acute infiltrate or pleural effusion.  No pulmonary edema.  Left humeral prosthesis again noted.  IMPRESSION: No active disease.  Stable examination.   Original Report Authenticated By: Natasha Mead, M.D.      No diagnosis found.    MDM  Pt comes in with cc of chest pain,  back pain. Pt has hx of sickle cell anemia. Clinically, doesn't appear to be acute chest.  We ordered CXR and basic labs, and actually the labs are better than the  last visit. I discussed with the patient that although he is in pain - it doesn't appear to be in crisis - and he understands. He is OK going home, but is requesting some pain meds to go home. I informed him that i cannot prescribe the heavy dose narcotics.  Will give him 2 mg dilaudid po x 4 to go home.   Derwood Kaplan, MD 01/19/13 1536    Date: 01/19/2013  Rate: 101  Rhythm: normal sinus rhythm  QRS Axis: normal  Intervals: normal  ST/T Wave abnormalities: normal  Conduction Disutrbances: none  Narrative Interpretation: unremarkable      Derwood Kaplan, MD 01/19/13 1615

## 2013-01-19 NOTE — ED Notes (Signed)
[id number]<AV> Expected date:<BR> Expected time:<BR> Means of arrival:<BR> Comments:<BR> Hold for triage

## 2013-01-19 NOTE — Telephone Encounter (Signed)
Received patient call c/o in sickle cell crisis , patient states he was told to come to be seen at the clinic. Patient reports he is leaving from ED and was told to come to clinic from the ED. Call directed to [facility] NP. NP addressed patient call.

## 2013-01-19 NOTE — ED Notes (Signed)
Patient transported to X-ray 

## 2013-01-21 ENCOUNTER — Inpatient Hospital Stay: Payer: Self-pay | Admitting: Internal Medicine

## 2013-01-21 LAB — CBC
HCT: 25.5 % — ABNORMAL LOW (ref 40.0–52.0)
MCH: 33.9 pg (ref 26.0–34.0)
Platelet: 351 10*3/uL (ref 150–440)
RBC: 2.58 10*6/uL — ABNORMAL LOW (ref 4.40–5.90)
RDW: 15.7 % — ABNORMAL HIGH (ref 11.5–14.5)
WBC: 9.7 10*3/uL (ref 3.8–10.6)

## 2013-01-21 LAB — URINALYSIS, COMPLETE
Bacteria: NONE SEEN
Bilirubin,UR: NEGATIVE
Blood: NEGATIVE
Glucose,UR: NEGATIVE mg/dL (ref 0–75)
Leukocyte Esterase: NEGATIVE
Nitrite: NEGATIVE
Protein: NEGATIVE
Squamous Epithelial: <1
WBC UR: 1 /HPF (ref 0–5)

## 2013-01-21 LAB — COMPREHENSIVE METABOLIC PANEL
Albumin: 4.5 g/dL (ref 3.4–5.0)
Alkaline Phosphatase: 93 U/L (ref 50–136)
Calcium, Total: 8.7 mg/dL (ref 8.5–10.1)
Co2: 25 mmol/L (ref 21–32)
Creatinine: 0.69 mg/dL (ref 0.60–1.30)
Osmolality: 277 (ref 275–301)
Potassium: 3.8 mmol/L (ref 3.5–5.1)
SGOT(AST): 26 U/L (ref 15–37)
Sodium: 140 mmol/L (ref 136–145)

## 2013-01-22 LAB — CBC WITH DIFFERENTIAL/PLATELET
Basophil: 1 %
Eosinophil: 1 %
HGB: 8 g/dL — ABNORMAL LOW (ref 13.0–18.0)
Lymphocytes: 62 %
MCH: 33.3 pg (ref 26.0–34.0)
MCHC: 33.8 g/dL (ref 32.0–36.0)
MCV: 99 fL (ref 80–100)
Monocytes: 3 %
NRBC/100 WBC: 2 /
Platelet: 340 10*3/uL (ref 150–440)
RBC: 2.39 10*6/uL — ABNORMAL LOW (ref 4.40–5.90)
RDW: 15.7 % — ABNORMAL HIGH (ref 11.5–14.5)
WBC: 11.1 10*3/uL — ABNORMAL HIGH (ref 3.8–10.6)

## 2013-01-22 LAB — BASIC METABOLIC PANEL
Anion Gap: 2 — ABNORMAL LOW (ref 7–16)
BUN: 9 mg/dL (ref 7–18)
Chloride: 111 mmol/L — ABNORMAL HIGH (ref 98–107)
EGFR (African American): >60
EGFR (Non-African Amer.): >60
Glucose: 98 mg/dL (ref 65–99)
Osmolality: 282 (ref 275–301)

## 2013-01-23 LAB — COMPREHENSIVE METABOLIC PANEL
Albumin: 3.9 g/dL (ref 3.4–5.0)
Alkaline Phosphatase: 79 U/L (ref 50–136)
Anion Gap: 2 — ABNORMAL LOW (ref 7–16)
Calcium, Total: 8.5 mg/dL (ref 8.5–10.1)
Co2: 28 mmol/L (ref 21–32)
Creatinine: 0.54 mg/dL — ABNORMAL LOW (ref 0.60–1.30)
EGFR (African American): >60
EGFR (Non-African Amer.): >60
Glucose: 86 mg/dL (ref 65–99)
Potassium: 3.6 mmol/L (ref 3.5–5.1)
SGOT(AST): 20 U/L (ref 15–37)
SGPT (ALT): 18 U/L (ref 12–78)
Sodium: 140 mmol/L (ref 136–145)

## 2013-01-23 LAB — CBC WITH DIFFERENTIAL/PLATELET
HGB: 8.7 g/dL — ABNORMAL LOW (ref 13.0–18.0)
Lymphocytes: 60 %
MCH: 33.3 pg (ref 26.0–34.0)
MCHC: 33.7 g/dL (ref 32.0–36.0)
MCV: 99 fL (ref 80–100)
NRBC/100 WBC: 1 /
RDW: 15.1 % — ABNORMAL HIGH (ref 11.5–14.5)
Variant Lymphocyte - H1-Rlymph: 1 %
WBC: 10.7 10*3/uL — ABNORMAL HIGH (ref 3.8–10.6)

## 2013-01-23 LAB — RETICULOCYTES
Absolute Retic Count: 0.132 10*6/uL
Reticulocyte: 5.07 % — ABNORMAL HIGH

## 2013-01-24 LAB — CBC WITH DIFFERENTIAL/PLATELET
Basophil #: 0.1 10*3/uL (ref 0.0–0.1)
Basophil %: 0.5 %
Eosinophil #: 0.2 10*3/uL (ref 0.0–0.7)
HCT: 24.9 % — ABNORMAL LOW (ref 40.0–52.0)
HGB: 8.2 g/dL — ABNORMAL LOW (ref 13.0–18.0)
Lymphocyte %: 48.6 %
MCH: 32.5 pg (ref 26.0–34.0)
MCV: 98 fL (ref 80–100)
Monocyte #: 0.5 x10 3/mm (ref 0.2–1.0)
Monocyte %: 4.5 %
Platelet: 326 10*3/uL (ref 150–440)
RBC: 2.53 10*6/uL — ABNORMAL LOW (ref 4.40–5.90)
RDW: 15.2 % — ABNORMAL HIGH (ref 11.5–14.5)
WBC: 11.7 10*3/uL — ABNORMAL HIGH (ref 3.8–10.6)

## 2013-01-25 ENCOUNTER — Ambulatory Visit: Payer: Medicare Other | Admitting: Internal Medicine

## 2013-01-25 LAB — CBC WITH DIFFERENTIAL/PLATELET
Basophil #: 0.1 10*3/uL (ref 0.0–0.1)
Basophil %: 1.1 %
Eosinophil #: 0.1 10*3/uL (ref 0.0–0.7)
HCT: 24.4 % — ABNORMAL LOW (ref 40.0–52.0)
HGB: 8.5 g/dL — ABNORMAL LOW (ref 13.0–18.0)
Lymphocyte #: 3.8 10*3/uL — ABNORMAL HIGH (ref 1.0–3.6)
MCH: 33.9 pg (ref 26.0–34.0)
MCHC: 35 g/dL (ref 32.0–36.0)
MCV: 97 fL (ref 80–100)
Neutrophil #: 5.2 10*3/uL (ref 1.4–6.5)
Platelet: 304 10*3/uL (ref 150–440)
RBC: 2.52 10*6/uL — ABNORMAL LOW (ref 4.40–5.90)
WBC: 9.8 10*3/uL (ref 3.8–10.6)
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
## 2013-01-26 ENCOUNTER — Ambulatory Visit: Payer: Medicare Other | Admitting: Internal Medicine

## 2013-01-26 ENCOUNTER — Telehealth: Payer: Self-pay | Admitting: Internal Medicine

## 2013-01-28 LAB — CULTURE, BLOOD (SINGLE)

## 2013-01-30 ENCOUNTER — Ambulatory Visit: Payer: Medicare Other | Admitting: Internal Medicine

## 2013-01-30 NOTE — Telephone Encounter (Signed)
I observed the return phone call from Dennis Bernard.  Pt spoke with Dr. Ashley Royalty and was informed that no refills can be made until pt comes into office to establish care.  Pt has had 3 prior appts on file to establish care, pt verbalized that he canceled these. He has rescheduled an appt to establish care on February 13, 2013 and at that time the discussion will be had regarding any Rx request.

## 2013-01-30 NOTE — Telephone Encounter (Signed)
Per Gwinda Passe, Dr. Ashley Royalty would like to speak with pt on the phone to discuss pts missed appts and future RX refills.  LMOV for pt to return call to office.

## 2013-02-01 ENCOUNTER — Telehealth (HOSPITAL_COMMUNITY): Payer: Self-pay

## 2013-02-01 ENCOUNTER — Ambulatory Visit: Payer: Medicare Other | Admitting: Internal Medicine
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
## 2013-02-03 ENCOUNTER — Emergency Department (HOSPITAL_COMMUNITY)
Admission: EM | Admit: 2013-02-03 | Discharge: 2013-02-03 | Disposition: A | Discharge status: Home / Self Care | Payer: Medicare Other | Attending: Emergency Medicine | Admitting: Emergency Medicine

## 2013-02-03 ENCOUNTER — Encounter (HOSPITAL_COMMUNITY): Payer: Self-pay | Admitting: Cardiology

## 2013-02-03 DIAGNOSIS — G894 Chronic pain syndrome: Secondary | ICD-10-CM | POA: Insufficient documentation

## 2013-02-03 DIAGNOSIS — M79609 Pain in unspecified limb: Secondary | ICD-10-CM | POA: Insufficient documentation

## 2013-02-03 DIAGNOSIS — M545 Low back pain, unspecified: Secondary | ICD-10-CM | POA: Insufficient documentation

## 2013-02-03 DIAGNOSIS — D5700 Hb-SS disease with crisis, unspecified: Secondary | ICD-10-CM

## 2013-02-03 DIAGNOSIS — Z8639 Personal history of other endocrine, nutritional and metabolic disease: Secondary | ICD-10-CM | POA: Insufficient documentation

## 2013-02-03 DIAGNOSIS — Z862 Personal history of diseases of the blood and blood-forming organs and certain disorders involving the immune mechanism: Secondary | ICD-10-CM | POA: Insufficient documentation

## 2013-02-03 DIAGNOSIS — Z8709 Personal history of other diseases of the respiratory system: Secondary | ICD-10-CM | POA: Insufficient documentation

## 2013-02-03 DIAGNOSIS — Z8739 Personal history of other diseases of the musculoskeletal system and connective tissue: Secondary | ICD-10-CM | POA: Insufficient documentation

## 2013-02-03 DIAGNOSIS — Z79899 Other long term (current) drug therapy: Secondary | ICD-10-CM | POA: Insufficient documentation

## 2013-02-03 LAB — COMPREHENSIVE METABOLIC PANEL
ALT: 33 U/L (ref 0–53)
AST: 36 U/L (ref 0–37)
Alkaline Phosphatase: 86 U/L (ref 39–117)
CO2: 23 mEq/L (ref 19–32)
Calcium: 9.4 mg/dL (ref 8.4–10.5)
GFR calc Af Amer: >90 mL/min (ref >90)
GFR calc non Af Amer: >90 mL/min (ref >90)
Glucose, Bld: 87 mg/dL (ref 70–99)
Potassium: 3.8 mEq/L (ref 3.5–5.1)
Sodium: 138 mEq/L (ref 135–145)

## 2013-02-03 LAB — CBC WITH DIFFERENTIAL/PLATELET
Basophils Absolute: 0 10*3/uL (ref 0.0–0.1)
Eosinophils Relative: 1 % (ref 0–5)
HCT: 24.3 % — ABNORMAL LOW (ref 39.0–52.0)
Hemoglobin: 8.4 g/dL — ABNORMAL LOW (ref 13.0–17.0)
Lymphocytes Relative: 36 % (ref 12–46)
Lymphs Abs: 4.2 10*3/uL — ABNORMAL HIGH (ref 0.7–4.0)
MCV: 97.2 fL (ref 78.0–100.0)
Monocytes Absolute: 1 10*3/uL (ref 0.1–1.0)
Monocytes Relative: 9 % (ref 3–12)
Neutro Abs: 6.2 10*3/uL (ref 1.7–7.7)
RBC: 2.5 MIL/uL — ABNORMAL LOW (ref 4.22–5.81)
WBC: 11.4 10*3/uL — ABNORMAL HIGH (ref 4.0–10.5)

## 2013-02-03 LAB — RETICULOCYTES: Retic Count, Absolute: 327.5 10*3/uL — ABNORMAL HIGH (ref 19.0–186.0)

## 2013-02-03 MED ORDER — ONDANSETRON HCL 4 MG/2ML IJ SOLN
4.0000 mg | Freq: Once | INTRAMUSCULAR | Status: AC
  Administered 2013-02-03: 4 mg via INTRAVENOUS
  Filled 2013-02-03: qty 2

## 2013-02-03 MED ORDER — HYDROMORPHONE HCL PF 1 MG/ML IJ SOLN: 1.0000 mg | Freq: Once | INTRAMUSCULAR | Status: DC

## 2013-02-03 MED ORDER — HEPARIN SOD (PORK) LOCK FLUSH 100 UNIT/ML IV SOLN
500.0000 [IU] | INTRAVENOUS | Status: DC | PRN
  Administered 2013-02-03: 500 [IU]
  Filled 2013-02-03: qty 5

## 2013-02-03 MED ORDER — HEPARIN SOD (PORK) LOCK FLUSH 100 UNIT/ML IV SOLN
500.0000 [IU] | INTRAVENOUS | Status: DC
  Filled 2013-02-03: qty 5

## 2013-02-03 MED ORDER — SODIUM CHLORIDE 0.9 % IV BOLUS (SEPSIS)
1000.0000 mL | Freq: Once | INTRAVENOUS | Status: AC
  Administered 2013-02-03: 1000 mL via INTRAVENOUS

## 2013-02-03 MED ORDER — DIPHENHYDRAMINE HCL 50 MG/ML IJ SOLN
25.0000 mg | Freq: Once | INTRAMUSCULAR | Status: AC
  Administered 2013-02-03: 25 mg via INTRAVENOUS
  Filled 2013-02-03: qty 1

## 2013-02-03 MED ORDER — HYDROMORPHONE HCL PF 2 MG/ML IJ SOLN
2.0000 mg | INTRAMUSCULAR | Status: AC | PRN
  Administered 2013-02-03 (×3): 2 mg via INTRAVENOUS
  Filled 2013-02-03 (×3): qty 1

## 2013-02-03 MED ORDER — DIPHENHYDRAMINE HCL 50 MG/ML IJ SOLN
12.5000 mg | Freq: Once | INTRAMUSCULAR | Status: AC
  Administered 2013-02-03: 12.5 mg via INTRAVENOUS

## 2013-02-03 NOTE — ED Notes (Signed)
IV Team contacted to access port after unsuccessful attempt by RN.

## 2013-02-03 NOTE — ED Provider Notes (Signed)
Pt with SS disease, frequent visits to ED for pain complaints - c/o pain in the lower back as well as the LUE from the shoulder to the elbow - he has had frequent episodes similar to this in past - not established in [facility] clinic.  He is seen by Marthann Schiller.    On exam he has no abd ttp, tachycardia has resolved, no abnormal lung sounds, MMM, no LE edema, no deformity or redness or swelling to LUE at pain site - normal ROM of L shoulder and elbow.    Labs, pain meds, dispo pending improvement.   Vida Roller, MD 02/03/13 [phone]

## 2013-02-03 NOTE — ED Notes (Signed)
Pt reports he has a port-a-cath

## 2013-02-03 NOTE — ED Notes (Signed)
Pain to lower back and left arm since Tuesday. Hx of sickle cell. Takes pain medications at home without relief. Pain 10/10. Has port a cath in place.
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
## 2013-02-03 NOTE — ED Provider Notes (Signed)
History     CSN: [id number]  Arrival date & time 02/03/13  1412   First MD Initiated Contact with Patient 02/03/13 1514      Chief Complaint  Patient presents with  . Sickle Cell Pain Crisis    (Consider location/radiation/quality/duration/timing/severity/associated sxs/prior treatment) HPI Comments: 27 y.o. male w/ pmh of sickle cell disease. States current pain crisis started 3 days ago, gradual. Exactly similar to prior pain crises that patient has had. Pain in lower back and left arm. Has been trying MS cotin, and instant release morphine, and states those meds have not been helping. He states he has run out of these meds, states he "ran out" two days ago. He states he is scheduled to go to the sickle cell pain center June 2nd, acknowledges that he has been missing appointments when social work has been Radiographer, therapeutic. He denies chest pain or shortness of breath. States that the pain crisis is exactly the same as typical pain crises.   Patient is a 27 y.o. male presenting with general illness. The history is provided by the patient.  Illness Location:  Lower back and left arm  Severity:  Mild Onset quality:  Gradual Duration:  3 days Timing:  Constant Progression:  Worsening Chronicity:  Chronic Associated symptoms: no abdominal pain, no chest pain, no congestion, no cough, no diarrhea, no headaches, no nausea, no rash, no shortness of breath and no wheezing     Past Medical History  Diagnosis Date  . Sickle cell disease   . Avascular necrosis of femur head, right   . H/O allergic rhinitis   . H/O hypokalemia   . Chronic pain syndrome   . H/O wheezing     with colds    Past Surgical History  Procedure Laterality Date  . Cholecystectomy  1995  . Portacath placement Left 07/04/2012  . Exchange transfusion  02/2008    perioperatively for shoulder surgery  . 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 Sickle cell anemia  Brother   . Sickle cell trait Father   . Sickle cell trait Mother   . Diabetes Mellitus II Mother   . Sickle cell anemia Paternal Uncle   . Asthma Neg Hx   . Allergic rhinitis Neg Hx     History  Substance Use Topics  . Smoking status: Never Smoker   . Smokeless tobacco: Never Used  . Alcohol Use: No      Review of Systems  Constitutional: Negative for chills and activity change.  HENT: Negative for congestion, drooling, mouth sores and neck pain.   Eyes: Negative for pain and discharge.  Respiratory: Negative for cough, chest tightness, shortness of breath and wheezing.   Cardiovascular: Negative for chest pain.  Gastrointestinal: Negative for nausea, abdominal pain, diarrhea and constipation.  Genitourinary: Negative for dysuria, flank pain and difficulty urinating.  Musculoskeletal: Positive for back pain. Negative for arthralgias.  Skin: Negative for color change, pallor and rash.  Neurological: Negative for dizziness, weakness, light-headedness and headaches.  Psychiatric/Behavioral: Negative for behavioral problems, confusion and agitation.    Allergies  Review of patient's allergies indicates no known allergies.  Home Medications   Current Outpatient Rx  Name  Route  Sig  Dispense  Refill  . albuterol (PROVENTIL HFA;VENTOLIN HFA) 108 (90 BASE) MCG/ACT inhaler   Inhalation   Inhale 1 puff into the lungs every 6 (six) hours as needed for wheezing or shortness of breath. wheezing  1 Inhaler   1   . diphenhydrAMINE (BENADRYL) 25 MG tablet   Oral   Take 25 mg by mouth every 8 (eight) hours as needed. For itching.         . folic acid (FOLVITE) 1 MG tablet   Oral   Take 1 mg by mouth every morning.         . hydroxyurea (HYDREA) 500 MG capsule   Oral   Take 2,000 mg by mouth every morning. May take with food to minimize GI side effects.         Marland Kitchen morphine (MS CONTIN) 60 MG 12 hr tablet   Oral   Take 60 mg by mouth 2 (two) times daily as needed for  pain.         Marland Kitchen morphine (MSIR) 15 MG tablet   Oral   Take 1 tablet (15 mg total) by mouth every 4 (four) hours as needed for pain (breakthrough pain). Pain   15 tablet   0   . promethazine (PHENERGAN) 25 MG tablet   Oral   Take 1 tablet (25 mg total) by mouth every 6 (six) hours as needed for nausea.   30 tablet   0     BP 140/81  Pulse 117  Temp(Src) 99 F (37.2 C) (Oral)  Resp 20  SpO2 97%  Physical Exam  Constitutional: He is oriented to person, place, and time. He appears well-developed. No distress.  HENT:  Head: Normocephalic.  Eyes: Pupils are equal, round, and reactive to light. Right eye exhibits no discharge. Left eye exhibits no discharge.  Neck: Neck supple. No tracheal deviation present.  Cardiovascular: Regular rhythm and intact distal pulses.   Pulmonary/Chest: Effort normal. No stridor. No respiratory distress. He has no wheezes.  Abdominal: Soft. He exhibits no distension. There is no tenderness. There is no rebound.  Musculoskeletal: Normal range of motion. He exhibits no tenderness.  Neurological: He is alert and oriented to person, place, and time. No cranial nerve deficit.  Skin: Skin is warm. No rash noted. He is not diaphoretic. No erythema.    ED Course  Procedures (including critical care time)  Labs Reviewed  CBC WITH DIFFERENTIAL - Abnormal; Notable for the following:    WBC 11.4 (*)    RBC 2.50 (*)    Hemoglobin 8.4 (*)    HCT 24.3 (*)    RDW 16.4 (*)    Lymphs Abs 4.2 (*)    All other components within normal limits  RETICULOCYTES - Abnormal; Notable for the following:    Retic Ct Pct 13.1 (*)    RBC. 2.50 (*)    Retic Count, Manual 327.5 (*)    All other components within normal limits  COMPREHENSIVE METABOLIC PANEL - Abnormal; Notable for the following:    Total Bilirubin 1.8 (*)    All other components within normal limits   No results found.   MDM  Pt with typical sickle cell crisis -- not having chest pain, shortness  of breath. Not concerned for acute chest. Suspect current pain crisis is secondary to pt being out of his meds. Will check cbc, retics. No indications to check chest x-ray with pt having no new oxygen requirement, no sob, no chest pain, not febrile.  Pt's retics are appropriately elevated, and hb is stable. His pain is controlled w/ dilaudid and he states he feels ready to go home. His repeat vital signs are WNL (not tachycardia, normotensive). He requests narcotic prescription --  I have talked to sickle cell pain center, and they state that pt can walk into their clinic on Tuesday or call ahead. I have stressed this to patient and he states he understands and that he will go to them Tuesday. I have told pt that we will not be able to give him narcotic scripts in the Er due to his hx of missing prior appointments and not following up appropriately with outpatient management team.   1. Sickle cell pain crisis           Bernadene Person, MD 02/04/13 [phone]

## 2013-02-03 NOTE — ED Notes (Signed)
IV Team at bedside 

## 2013-02-06 NOTE — ED Provider Notes (Signed)
I saw and evaluated the patient, reviewed the resident's note and I agree with the findings and plan.  Please see my separate note regarding my evaluation of the patient.   Vida Roller, MD 02/06/13 [phone]

## 2013-02-11 ENCOUNTER — Inpatient Hospital Stay: Payer: Self-pay | Admitting: Internal Medicine

## 2013-02-11 LAB — COMPREHENSIVE METABOLIC PANEL
Anion Gap: 6 — ABNORMAL LOW (ref 7–16)
BUN: 13 mg/dL (ref 7–18)
Calcium, Total: 9.3 mg/dL (ref 8.5–10.1)
Co2: 24 mmol/L (ref 21–32)
EGFR (African American): >60
EGFR (Non-African Amer.): >60
Osmolality: 274 (ref 275–301)
Potassium: 3.7 mmol/L (ref 3.5–5.1)
SGOT(AST): 38 U/L — ABNORMAL HIGH (ref 15–37)
Sodium: 137 mmol/L (ref 136–145)
Total Protein: 8.6 g/dL — ABNORMAL HIGH (ref 6.4–8.2)

## 2013-02-11 LAB — CBC
HGB: 10 g/dL — ABNORMAL LOW (ref 13.0–18.0)
Platelet: 421 10*3/uL (ref 150–440)
RBC: 2.99 10*6/uL — ABNORMAL LOW (ref 4.40–5.90)
RDW: 15.9 % — ABNORMAL HIGH (ref 11.5–14.5)

## 2013-02-11 LAB — URINALYSIS, COMPLETE
Bacteria: NONE SEEN
Bilirubin,UR: NEGATIVE
Blood: NEGATIVE
Glucose,UR: NEGATIVE mg/dL (ref 0–75)
Hyaline Cast: 1
Leukocyte Esterase: NEGATIVE
Ph: 5 (ref 4.5–8.0)
Protein: NEGATIVE
Specific Gravity: 1.011 (ref 1.003–1.030)
Squamous Epithelial: NONE SEEN
WBC UR: NONE SEEN /HPF (ref 0–5)

## 2013-02-11 LAB — RETICULOCYTES: Reticulocyte: 5.97 % — ABNORMAL HIGH

## 2013-02-12 LAB — BASIC METABOLIC PANEL
Anion Gap: 5 — ABNORMAL LOW (ref 7–16)
BUN: 10 mg/dL (ref 7–18)
Calcium, Total: 8.4 mg/dL — ABNORMAL LOW (ref 8.5–10.1)
Co2: 26 mmol/L (ref 21–32)
Creatinine: 0.47 mg/dL — ABNORMAL LOW (ref 0.60–1.30)
EGFR (African American): >60
EGFR (Non-African Amer.): >60
Glucose: 88 mg/dL (ref 65–99)
Osmolality: 280 (ref 275–301)
Potassium: 3.4 mmol/L — ABNORMAL LOW (ref 3.5–5.1)

## 2013-02-12 LAB — CBC WITH DIFFERENTIAL/PLATELET
Basophil %: 0.5 %
Eosinophil #: 0.1 10*3/uL (ref 0.0–0.7)
HCT: 23.9 % — ABNORMAL LOW (ref 40.0–52.0)
Lymphocyte #: 4.3 10*3/uL — ABNORMAL HIGH (ref 1.0–3.6)
Lymphocyte %: 39.1 %
MCH: 32.9 pg (ref 26.0–34.0)
MCHC: 33.7 g/dL (ref 32.0–36.0)
MCV: 98 fL (ref 80–100)
Monocyte #: 1 x10 3/mm (ref 0.2–1.0)
Neutrophil #: 5.5 10*3/uL (ref 1.4–6.5)
Neutrophil %: 50.3 %
Platelet: 334 10*3/uL (ref 150–440)
RBC: 2.45 10*6/uL — ABNORMAL LOW (ref 4.40–5.90)
RDW: 15.9 % — ABNORMAL HIGH (ref 11.5–14.5)
WBC: 11 10*3/uL — ABNORMAL HIGH (ref 3.8–10.6)

## 2013-02-13 ENCOUNTER — Encounter: Payer: Self-pay | Admitting: Internal Medicine

## 2013-02-13 ENCOUNTER — Ambulatory Visit (INDEPENDENT_AMBULATORY_CARE_PROVIDER_SITE_OTHER): Payer: Medicare Other | Admitting: Internal Medicine

## 2013-02-13 VITALS — BP 128/85 | HR 104 | Temp 98.8°F | Ht 69.0 in | Wt 179.0 lb

## 2013-02-13 DIAGNOSIS — G894 Chronic pain syndrome: Secondary | ICD-10-CM

## 2013-02-13 DIAGNOSIS — D5700 Hb-SS disease with crisis, unspecified: Secondary | ICD-10-CM

## 2013-02-13 DIAGNOSIS — Z9114 Patient's other noncompliance with medication regimen: Secondary | ICD-10-CM | POA: Insufficient documentation

## 2013-02-13 DIAGNOSIS — Z9119 Patient's noncompliance with other medical treatment and regimen: Secondary | ICD-10-CM

## 2013-02-13 LAB — CBC WITH DIFFERENTIAL/PLATELET
Eosinophil %: 1.9 %
HCT: 24.5 % — ABNORMAL LOW (ref 40.0–52.0)
HGB: 8.5 g/dL — ABNORMAL LOW (ref 13.0–18.0)
MCH: 33.5 pg (ref 26.0–34.0)
Monocyte #: 1 x10 3/mm (ref 0.2–1.0)
Neutrophil #: 3.7 10*3/uL (ref 1.4–6.5)
Neutrophil %: 43.9 %
Platelet: 323 10*3/uL (ref 150–440)
RBC: 2.52 10*6/uL — ABNORMAL LOW (ref 4.40–5.90)
RDW: 15.7 % — ABNORMAL HIGH (ref 11.5–14.5)
WBC: 8.4 10*3/uL (ref 3.8–10.6)

## 2013-02-13 LAB — BASIC METABOLIC PANEL
Anion Gap: 4 — ABNORMAL LOW (ref 7–16)
BUN: 6 mg/dL — ABNORMAL LOW (ref 7–18)
Calcium, Total: 8.4 mg/dL — ABNORMAL LOW (ref 8.5–10.1)
Chloride: 112 mmol/L — ABNORMAL HIGH (ref 98–107)
Co2: 25 mmol/L (ref 21–32)
Creatinine: 0.48 mg/dL — ABNORMAL LOW (ref 0.60–1.30)
EGFR (African American): >60
EGFR (Non-African Amer.): >60
Glucose: 85 mg/dL (ref 65–99)
Sodium: 141 mmol/L (ref 136–145)

## 2013-02-13 LAB — MAGNESIUM: Magnesium: 1.5 mg/dL — ABNORMAL LOW

## 2013-02-13 MED ORDER — MORPHINE SULFATE ER 60 MG PO TBCR: 60.0000 mg | EXTENDED_RELEASE_TABLET | Freq: Two times a day (BID) | ORAL | Status: DC | PRN

## 2013-02-13 MED ORDER — HYDROMORPHONE HCL 4 MG PO TABS: 4.0000 mg | ORAL_TABLET | ORAL | Status: DC | PRN
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
## 2013-02-14 ENCOUNTER — Encounter: Payer: Self-pay | Admitting: Internal Medicine

## 2013-02-14 ENCOUNTER — Encounter (HOSPITAL_COMMUNITY): Payer: Self-pay | Admitting: Emergency Medicine

## 2013-02-14 ENCOUNTER — Ambulatory Visit: Payer: Medicare Other | Admitting: Internal Medicine

## 2013-02-14 ENCOUNTER — Emergency Department (HOSPITAL_COMMUNITY)
Admission: EM | Admit: 2013-02-14 | Discharge: 2013-02-14 | Disposition: A | Discharge status: Home / Self Care | Payer: Medicare Other | Attending: Emergency Medicine | Admitting: Emergency Medicine

## 2013-02-14 ENCOUNTER — Ambulatory Visit (INDEPENDENT_AMBULATORY_CARE_PROVIDER_SITE_OTHER): Payer: Medicare Other | Admitting: Internal Medicine

## 2013-02-14 VITALS — BP 133/81 | HR 123 | Temp 98.5°F | Ht 69.0 in | Wt 172.0 lb

## 2013-02-14 DIAGNOSIS — D5700 Hb-SS disease with crisis, unspecified: Secondary | ICD-10-CM | POA: Diagnosis present

## 2013-02-14 DIAGNOSIS — Z8709 Personal history of other diseases of the respiratory system: Secondary | ICD-10-CM | POA: Insufficient documentation

## 2013-02-14 DIAGNOSIS — G894 Chronic pain syndrome: Secondary | ICD-10-CM

## 2013-02-14 DIAGNOSIS — Z87898 Personal history of other specified conditions: Secondary | ICD-10-CM

## 2013-02-14 DIAGNOSIS — Z79899 Other long term (current) drug therapy: Secondary | ICD-10-CM | POA: Diagnosis not present

## 2013-02-14 DIAGNOSIS — Z862 Personal history of diseases of the blood and blood-forming organs and certain disorders involving the immune mechanism: Secondary | ICD-10-CM | POA: Insufficient documentation

## 2013-02-14 DIAGNOSIS — M79609 Pain in unspecified limb: Secondary | ICD-10-CM | POA: Diagnosis not present

## 2013-02-14 DIAGNOSIS — M545 Low back pain, unspecified: Secondary | ICD-10-CM | POA: Insufficient documentation

## 2013-02-14 DIAGNOSIS — Z8639 Personal history of other endocrine, nutritional and metabolic disease: Secondary | ICD-10-CM | POA: Insufficient documentation

## 2013-02-14 DIAGNOSIS — F1920 Other psychoactive substance dependence, uncomplicated: Secondary | ICD-10-CM

## 2013-02-14 DIAGNOSIS — Z8739 Personal history of other diseases of the musculoskeletal system and connective tissue: Secondary | ICD-10-CM | POA: Diagnosis not present

## 2013-02-14 DIAGNOSIS — K5900 Constipation, unspecified: Secondary | ICD-10-CM

## 2013-02-14 DIAGNOSIS — F1311 Sedative, hypnotic or anxiolytic abuse, in remission: Secondary | ICD-10-CM

## 2013-02-14 DIAGNOSIS — F1120 Opioid dependence, uncomplicated: Secondary | ICD-10-CM | POA: Insufficient documentation

## 2013-02-14 LAB — CBC WITH DIFFERENTIAL/PLATELET
Eosinophils Relative: 0 % (ref 0–5)
HCT: 25.3 % — ABNORMAL LOW (ref 39.0–52.0)
Lymphocytes Relative: 41 % (ref 12–46)
Lymphs Abs: 3.8 10*3/uL (ref 0.7–4.0)
MCV: 90.7 fL (ref 78.0–100.0)
Monocytes Absolute: 0.8 10*3/uL (ref 0.1–1.0)
Platelets: 421 10*3/uL — ABNORMAL HIGH (ref 150–400)
RBC: 2.79 MIL/uL — ABNORMAL LOW (ref 4.22–5.81)
WBC: 9.3 10*3/uL (ref 4.0–10.5)

## 2013-02-14 LAB — COMPREHENSIVE METABOLIC PANEL
ALT: 12 U/L (ref 0–53)
Alkaline Phosphatase: 77 U/L (ref 39–117)
CO2: 25 mEq/L (ref 19–32)
Calcium: 9.7 mg/dL (ref 8.4–10.5)
Chloride: 102 mEq/L (ref 96–112)
GFR calc Af Amer: >90 mL/min (ref >90)
GFR calc non Af Amer: >90 mL/min (ref >90)
Glucose, Bld: 101 mg/dL — ABNORMAL HIGH (ref 70–99)
Sodium: 137 mEq/L (ref 135–145)
Total Bilirubin: 2.2 mg/dL — ABNORMAL HIGH (ref 0.3–1.2)

## 2013-02-14 LAB — RETICULOCYTES: Retic Count, Absolute: 156.2 10*3/uL (ref 19.0–186.0)

## 2013-02-14 MED ORDER — SODIUM CHLORIDE 0.9 % IV BOLUS (SEPSIS)
1000.0000 mL | Freq: Once | INTRAVENOUS | Status: AC
  Administered 2013-02-14: 1000 mL via INTRAVENOUS

## 2013-02-14 MED ORDER — SENNA-DOCUSATE SODIUM 8.6-50 MG PO TABS: 1.0000 | ORAL_TABLET | Freq: Two times a day (BID) | ORAL | Status: DC

## 2013-02-14 MED ORDER — DIPHENHYDRAMINE HCL 50 MG/ML IJ SOLN
25.0000 mg | Freq: Once | INTRAMUSCULAR | Status: AC
  Administered 2013-02-14: 25 mg via INTRAVENOUS
  Filled 2013-02-14: qty 1

## 2013-02-14 MED ORDER — MORPHINE SULFATE ER 60 MG PO TBCR: 60.0000 mg | EXTENDED_RELEASE_TABLET | Freq: Two times a day (BID) | ORAL | Status: DC | PRN

## 2013-02-14 MED ORDER — ONDANSETRON HCL 4 MG/2ML IJ SOLN
4.0000 mg | Freq: Once | INTRAMUSCULAR | Status: AC
  Administered 2013-02-14: 4 mg via INTRAVENOUS
  Filled 2013-02-14: qty 2

## 2013-02-14 MED ORDER — HYDROMORPHONE HCL 4 MG PO TABS: 4.0000 mg | ORAL_TABLET | ORAL | Status: DC | PRN

## 2013-02-14 MED ORDER — HEPARIN SOD (PORK) LOCK FLUSH 100 UNIT/ML IV SOLN
INTRAVENOUS | Status: AC
  Administered 2013-02-14: 500 [IU]
  Filled 2013-02-14: qty 5

## 2013-02-14 MED ORDER — HYDROMORPHONE HCL PF 2 MG/ML IJ SOLN
2.0000 mg | Freq: Once | INTRAMUSCULAR | Status: AC
  Administered 2013-02-14: 2 mg via INTRAVENOUS
  Filled 2013-02-14: qty 1

## 2013-02-14 NOTE — ED Notes (Signed)
Pt c/o low back and left leg pain x 4 days.

## 2013-02-14 NOTE — Progress Notes (Signed)
Subjective:    Patient ID: Dennis Bernard, male    DOB: 19-Feb-1986, 27 y.o.   MRN: [id number]  HPI patient presents for followup visit today after he went to the emergency room this morning. She was seen in clinic yesterday and failed to produce urine for toxicology screen. The patient was given enough pain medication to get him through the day to come back and will feel some requirements for engaging in treatment contract with this clinic. I spoke with the patient this morning and reminded him that he needed to bring in his police report for stolen narcotics, fentanyl patches and to have a toxicology screen done today since he failed to the state to have it done yesterday. After speaking with me the patient went to the emergency room for narcotic treatment of his pain. He was evaluated in the emergency room on discharge to followup with his appointment in the clinic this afternoon. Upon arrival the clinic today the patient stated that he has filed a police report and had a report number which was given to the case manager. We did collect a urine for toxicology screen however given the fact that the patient has had narcotics since yesterday the toxicology screen will likely be of limited value. Today patient did not have his fentanyl patches and today states that his fentanyl patches are indeterminate his parents house and he had no transportation to get the fentanyl patches.   The patient continues to complain of pain in his left hip and leg which she states is preventing him from being able to sleep. He denies any nausea, vomiting, fever, chills or diarrhea.    Review of Systems  Constitutional: Negative.   HENT: Negative.   Eyes: Negative.   Respiratory: Negative.   Cardiovascular: Negative.   Gastrointestinal: Negative.   Endocrine: Negative.   Genitourinary: Negative.   Musculoskeletal: Positive for back pain.  Skin: Negative.   Allergic/Immunologic: Negative.   Neurological: Negative.    Hematological: Negative.   Psychiatric/Behavioral: Negative.        Objective:   Physical Exam  Constitutional: He appears well-developed and well-nourished.  HENT:  Head: Normocephalic and atraumatic.  Eyes: Conjunctivae are normal. Pupils are equal, round, and reactive to light. No scleral icterus.  Neck: Normal range of motion. Neck supple.  Cardiovascular: Normal rate and regular rhythm.   Pulmonary/Chest: Effort normal and breath sounds normal.  Skin: Skin is warm and dry.  Psychiatric: He has a normal mood and affect. His behavior is normal. Judgment and thought content normal.          Assessment & Plan:  1. Narcotic dependency and chronic pain: The patient is on long-acting narcotics and has what appears to be narcotic dependency as he has had withdrawal when stopping his medications in the past. I had a very long discussion in the presence of the case management this patient's as his behavior surrounding his narcotic medications are very suspicious. In the interest of patient's access to care I have given the patient one more opportunity to produce the fentanyl patches which he assures me that he has not used. However the patient's inability to do so on the next occasion would lead me to believe that he is diverting narcotics and will then have to be discharged from my care. In the interim I have given the patient's a one-week supply of the medications that he's currently taking so as to prevent withdrawal and frequent visits to the emergency room. The patient insists that  he has not engaged in any diversion of his fentanyl patches and that they're at his parent's house who were side in [place] while he is here school [employer]. He states he currently does not have a lot of opportunity to get back and forth to his parents house and has been given one week to produce fentanyl patches. I have significant gallop of the relationship of trust can be built with this patient  given the inconsistencies that have been reported in the past. This is my first experience with the patient on the outpatient basis and on trying to get the patient every benefit of the doubt so that he does not lose access to healthcare.  I have asked the patient to obtain a Sentinel patches and then make an appointment to follow up with me within a week. I will review his toxicology screen and at that time make a determination as to whether or not we will accept the patient under my care.  Last emergency room have been reviewed and the patient's labs indicate hematological stability.   Total time spent with this patient was 30 minutes and greater than 30 minutes was spent in discussion about his sickle cell disease and symptom management

## 2013-02-14 NOTE — Progress Notes (Signed)
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 This CM in with Dr. Ashley Royalty present in the room. Mr.Garrott stated he placed a police report with [facility] police department this morning for his stolen medication from April 2014. Mr. Ardoin gave a number for incident submission report, [id number]. Mr.Harnois does not have his fentanyl patches at this visit, he stated the fentanyl patches are in [place], [place]. Dr. Ashley Royalty clarified that he is not currently a patient with Dr. Matthews/[facility], however Mr.Kracke's toxicology screening will be reviewed and Dr.Matthews will give the patient 1 week of pain medication and will see him back in 1 week.  Dr. Ashley Royalty clarified that this is his last opportunity to be a patient with this  practice. Dr. Ashley Royalty also requested a patient bring his fentanyl patches with him on his next visit. Mr.Wingrove verbalized understanding of his patient responsibilities and Dr. Ashley Royalty expectations for his next visit.    Barriers to Care: Medication management  Prior Approval (PA) #: NA PA start date: NA PA end date: NA   Action/Plan: This CM explained her role at the [facility]. This CM encouraged Mr. Sippel to take responisibility for his actions and be upfront with Dr. Ashley Royalty. This CM will refer Mr.Denslow to the [facility] due to no connections with case manager in [place].       Comments: none  Time spent: 20 mins  Karoline Caldwell, Charity fundraiser, BSN, [place] [phone]

## 2013-02-14 NOTE — ED Provider Notes (Signed)
History     CSN: [id number]  Arrival date & time 02/14/13  1057   First MD Initiated Contact with Patient 02/14/13 1202      Chief Complaint  Patient presents with  . Sickle Cell Pain Crisis    (Consider location/radiation/quality/duration/timing/severity/associated sxs/prior treatment) HPI Patient presents with concerns of ongoing low back and left leg pain.  He states that this is a typical distribution of pain for his sickle cell pain crises episodes. This episode began approximately 3 days ago, since onset has been persistent, in spite of using his home MS Contin and Dilaudid.  He has new fevers, chills, vomiting, diarrhea, incontinence, confusion confrontational testing, dyspnea.  Past Medical History  Diagnosis Date  . Sickle cell disease   . Avascular necrosis of femur head, right   . H/O allergic rhinitis   . H/O hypokalemia   . Chronic pain syndrome   . H/O wheezing     with colds    Past Surgical History  Procedure Laterality Date  . Cholecystectomy  1995  . Portacath placement Left 07/04/2012  . Exchange transfusion  02/2008    perioperatively for shoulder surgery  . 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 Sickle cell anemia Brother   . Sickle cell trait Father   . Sickle cell trait Mother   . Diabetes Mellitus II Mother   . Sickle cell anemia Paternal Uncle   . Asthma Neg Hx   . Allergic rhinitis Neg Hx     History  Substance Use Topics  . Smoking status: Never Smoker   . Smokeless tobacco: Never Used  . Alcohol Use: No      Review of Systems  All other systems reviewed and are negative.    Allergies  Review of patient's allergies indicates no known allergies.  Home Medications   Current Outpatient Rx  Name  Route  Sig  Dispense  Refill  . albuterol (PROVENTIL HFA;VENTOLIN HFA) 108 (90 BASE) MCG/ACT inhaler   Inhalation   Inhale 1 puff into the lungs every 6 (six) hours as needed  for wheezing or shortness of breath. wheezing   1 Inhaler   1   . diphenhydrAMINE (BENADRYL) 25 MG tablet   Oral   Take 25 mg by mouth every 8 (eight) hours as needed. For itching.         . folic acid (FOLVITE) 1 MG tablet   Oral   Take 1 mg by mouth every morning.         Marland Kitchen HYDROmorphone (DILAUDID) 4 MG tablet   Oral   Take 1 tablet (4 mg total) by mouth every 4 (four) hours as needed for pain.   6 tablet   0   . hydroxyurea (HYDREA) 500 MG capsule   Oral   Take 2,000 mg by mouth every morning. May take with food to minimize GI side effects.         Marland Kitchen morphine (MS CONTIN) 60 MG 12 hr tablet   Oral   Take 1 tablet (60 mg total) by mouth 2 (two) times daily as needed for pain.   2 tablet   0   . promethazine (PHENERGAN) 25 MG tablet   Oral   Take 1 tablet (25 mg total) by mouth every 6 (six) hours as needed for nausea.   30 tablet   0   . senna (SENOKOT) 8.6 MG tablet   Oral   Take  1 tablet by mouth as needed for constipation (constipation).           BP 114/77  Pulse 97  Temp(Src) 97.7 F (36.5 C) (Oral)  Resp 16  SpO2 100%  Physical Exam  Nursing note and vitals reviewed. Constitutional: He is oriented to person, place, and time. He appears well-developed. No distress.  HENT:  Head: Normocephalic and atraumatic.  Eyes: Conjunctivae and EOM are normal.  Cardiovascular: Normal rate and regular rhythm.   Pulmonary/Chest: Effort normal. No stridor. No respiratory distress.  Upper chest wall port in place, clean, dry, intact  Abdominal: He exhibits no distension.  Musculoskeletal: He exhibits no edema.  Neurological: He is alert and oriented to person, place, and time.  Skin: Skin is warm and dry.  Psychiatric: He has a normal mood and affect.    ED Course  Procedures (including critical care time)  Labs Reviewed  CBC WITH DIFFERENTIAL - Abnormal; Notable for the following:    RBC 2.79 (*)    Hemoglobin 8.9 (*)    HCT 25.3 (*)    Platelets  421 (*)    All other components within normal limits  COMPREHENSIVE METABOLIC PANEL - Abnormal; Notable for the following:    Potassium 3.2 (*)    Glucose, Bld 101 (*)    Total Bilirubin 2.2 (*)    All other components within normal limits  RETICULOCYTES - Abnormal; Notable for the following:    Retic Ct Pct 5.6 (*)    RBC. 2.79 (*)    All other components within normal limits   No results found.   No diagnosis found. After the initial valuation reviewed the patient's chart, and soon thereafter the patient's primary care physician, Dr. Ashley Royalty tonsillectomy.  The patient has an appointment for later today, and was seen by her yesterday.      MDM  This young male with sickle cell disease presents with pain in his low back and left leg.  Norflex for acute progression of disease, nor any evidence of noncompensated disease. The patient has an appointment in the next few hours, was discharged after his pain was improved here with reassuring labs and vitals.       Gerhard Munch, MD 02/14/13 1330

## 2013-02-16 LAB — OPIATES/OPIOIDS (LC/MS-MS)
Hydromorphone: 5589 ng/mL — AB
Morphine Urine: >10000 ng/mL — AB
Noroxycodone, Ur: NEGATIVE ng/mL
Oxycodone, ur: NEGATIVE ng/mL
Oxymorphone: NEGATIVE ng/mL

## 2013-02-17 LAB — PRESCRIPTION MONITORING PROFILE (SOLSTAS)
Amphetamine/Meth: NEGATIVE ng/mL
Creatinine, Urine: 61.05 mg/dL (ref >20.0)
Methadone Screen, Urine: NEGATIVE ng/mL
Nitrites, Initial: NEGATIVE ug/mL
Oxycodone Screen, Ur: NEGATIVE ng/mL
Propoxyphene: NEGATIVE ng/mL
Tapentadol, urine: NEGATIVE ng/mL

## 2013-02-17 LAB — CULTURE, BLOOD (SINGLE)

## 2013-02-18 ENCOUNTER — Emergency Department: Payer: Self-pay | Admitting: Emergency Medicine

## 2013-02-18 ENCOUNTER — Emergency Department (HOSPITAL_COMMUNITY): Payer: Medicare Other

## 2013-02-18 ENCOUNTER — Inpatient Hospital Stay (HOSPITAL_COMMUNITY)
Admission: EM | Admit: 2013-02-18 | Discharge: 2013-02-21 | DRG: 812 | Disposition: A | Discharge status: Home / Self Care | Payer: Medicare Other | Attending: Internal Medicine | Admitting: Internal Medicine

## 2013-02-18 ENCOUNTER — Encounter (HOSPITAL_COMMUNITY): Payer: Self-pay | Admitting: Emergency Medicine

## 2013-02-18 DIAGNOSIS — Z9114 Patient's other noncompliance with medication regimen: Secondary | ICD-10-CM

## 2013-02-18 DIAGNOSIS — F1120 Opioid dependence, uncomplicated: Secondary | ICD-10-CM

## 2013-02-18 DIAGNOSIS — G8929 Other chronic pain: Secondary | ICD-10-CM

## 2013-02-18 DIAGNOSIS — M549 Dorsalgia, unspecified: Secondary | ICD-10-CM

## 2013-02-18 DIAGNOSIS — G894 Chronic pain syndrome: Secondary | ICD-10-CM | POA: Diagnosis present

## 2013-02-18 DIAGNOSIS — M87059 Idiopathic aseptic necrosis of unspecified femur: Secondary | ICD-10-CM | POA: Diagnosis present

## 2013-02-18 DIAGNOSIS — Z79899 Other long term (current) drug therapy: Secondary | ICD-10-CM

## 2013-02-18 DIAGNOSIS — E876 Hypokalemia: Secondary | ICD-10-CM | POA: Diagnosis present

## 2013-02-18 DIAGNOSIS — Z9119 Patient's noncompliance with other medical treatment and regimen: Secondary | ICD-10-CM

## 2013-02-18 DIAGNOSIS — Z91199 Patient's noncompliance with other medical treatment and regimen due to unspecified reason: Secondary | ICD-10-CM

## 2013-02-18 DIAGNOSIS — D5700 Hb-SS disease with crisis, unspecified: Principal | ICD-10-CM | POA: Diagnosis present

## 2013-02-18 DIAGNOSIS — F1920 Other psychoactive substance dependence, uncomplicated: Secondary | ICD-10-CM

## 2013-02-18 LAB — BASIC METABOLIC PANEL
CO2: 25 mEq/L (ref 19–32)
Calcium: 9.7 mg/dL (ref 8.4–10.5)
Chloride: 105 mEq/L (ref 96–112)
Creatinine, Ser: 0.7 mg/dL (ref 0.50–1.35)
Glucose, Bld: 96 mg/dL (ref 70–99)

## 2013-02-18 LAB — CBC WITH DIFFERENTIAL/PLATELET
Basophils Absolute: 0.1 10*3/uL (ref 0.0–0.1)
Basophils Relative: 1 % (ref 0–1)
Eosinophils Relative: 0 % (ref 0–5)
HCT: 26.6 % — ABNORMAL LOW (ref 39.0–52.0)
Hemoglobin: 9.1 g/dL — ABNORMAL LOW (ref 13.0–17.0)
MCH: 31.6 pg (ref 26.0–34.0)
MCHC: 34.2 g/dL (ref 30.0–36.0)
MCV: 92.4 fL (ref 78.0–100.0)
Monocytes Absolute: 0.3 10*3/uL (ref 0.1–1.0)
Monocytes Relative: 4 % (ref 3–12)
RDW: 17.2 % — ABNORMAL HIGH (ref 11.5–15.5)

## 2013-02-18 LAB — RAPID URINE DRUG SCREEN, HOSP PERFORMED
Amphetamines: NOT DETECTED
Barbiturates: NOT DETECTED
Benzodiazepines: NOT DETECTED
Cocaine: NOT DETECTED

## 2013-02-18 MED ORDER — FOLIC ACID 1 MG PO TABS
1.0000 mg | ORAL_TABLET | Freq: Every day | ORAL | Status: DC
  Administered 2013-02-18 – 2013-02-21 (×4): 1 mg via ORAL
  Filled 2013-02-18 (×4): qty 1

## 2013-02-18 MED ORDER — DIPHENHYDRAMINE HCL 25 MG PO CAPS
25.0000 mg | ORAL_CAPSULE | Freq: Four times a day (QID) | ORAL | Status: DC | PRN
  Administered 2013-02-18: 25 mg via ORAL
  Filled 2013-02-18 (×2): qty 1

## 2013-02-18 MED ORDER — SENNOSIDES-DOCUSATE SODIUM 8.6-50 MG PO TABS
1.0000 | ORAL_TABLET | Freq: Two times a day (BID) | ORAL | Status: DC
  Administered 2013-02-18 – 2013-02-21 (×6): 1 via ORAL
  Filled 2013-02-18 (×7): qty 1

## 2013-02-18 MED ORDER — DIPHENHYDRAMINE HCL 50 MG/ML IJ SOLN
12.5000 mg | Freq: Once | INTRAMUSCULAR | Status: AC
  Administered 2013-02-18: 12.5 mg via INTRAVENOUS
  Filled 2013-02-18: qty 1

## 2013-02-18 MED ORDER — HYDROMORPHONE HCL PF 2 MG/ML IJ SOLN
2.0000 mg | Freq: Once | INTRAMUSCULAR | Status: AC
  Administered 2013-02-18: 2 mg via INTRAVENOUS
  Filled 2013-02-18: qty 1

## 2013-02-18 MED ORDER — HYDROMORPHONE HCL PF 2 MG/ML IJ SOLN
2.0000 mg | INTRAMUSCULAR | Status: DC | PRN
  Administered 2013-02-18: 2 mg via INTRAVENOUS
  Filled 2013-02-18: qty 1

## 2013-02-18 MED ORDER — HYDROMORPHONE HCL PF 2 MG/ML IJ SOLN: 2.0000 mg | INTRAMUSCULAR | Status: DC | PRN

## 2013-02-18 MED ORDER — SODIUM CHLORIDE 0.9 % IV SOLN
INTRAVENOUS | Status: DC
  Administered 2013-02-18: 75 mL/h via INTRAVENOUS
  Administered 2013-02-19: 1000 mL via INTRAVENOUS
  Administered 2013-02-19 – 2013-02-20 (×3): via INTRAVENOUS
  Administered 2013-02-21: 10000 mL via INTRAVENOUS

## 2013-02-18 MED ORDER — DIPHENHYDRAMINE HCL 25 MG PO CAPS
25.0000 mg | ORAL_CAPSULE | ORAL | Status: DC | PRN
  Administered 2013-02-19 – 2013-02-21 (×4): 25 mg via ORAL
  Filled 2013-02-18 (×4): qty 1

## 2013-02-18 MED ORDER — HYDROMORPHONE HCL PF 2 MG/ML IJ SOLN
4.0000 mg | INTRAMUSCULAR | Status: DC | PRN
  Administered 2013-02-18 – 2013-02-20 (×16): 4 mg via INTRAVENOUS
  Filled 2013-02-18 (×16): qty 2

## 2013-02-18 MED ORDER — HYDROMORPHONE HCL PF 2 MG/ML IJ SOLN: 4.0000 mg | INTRAMUSCULAR | Status: DC | PRN

## 2013-02-18 MED ORDER — KETOROLAC TROMETHAMINE 30 MG/ML IJ SOLN
30.0000 mg | Freq: Once | INTRAMUSCULAR | Status: AC
  Administered 2013-02-18: 30 mg via INTRAVENOUS
  Filled 2013-02-18: qty 1

## 2013-02-18 MED ORDER — ONDANSETRON HCL 4 MG/2ML IJ SOLN
4.0000 mg | Freq: Once | INTRAMUSCULAR | Status: AC
  Administered 2013-02-18: 4 mg via INTRAVENOUS
  Filled 2013-02-18: qty 2

## 2013-02-18 MED ORDER — ALBUTEROL SULFATE HFA 108 (90 BASE) MCG/ACT IN AERS: 1.0000 | INHALATION_SPRAY | Freq: Four times a day (QID) | RESPIRATORY_TRACT | Status: DC | PRN

## 2013-02-18 MED ORDER — ENOXAPARIN SODIUM 30 MG/0.3ML ~~LOC~~ SOLN
30.0000 mg | SUBCUTANEOUS | Status: DC
  Administered 2013-02-18: 30 mg via SUBCUTANEOUS
  Filled 2013-02-18 (×2): qty 0.3

## 2013-02-18 MED ORDER — HYDROMORPHONE HCL PF 1 MG/ML IJ SOLN: 1.0000 mg | Freq: Once | INTRAMUSCULAR | Status: DC

## 2013-02-18 MED ORDER — PROMETHAZINE HCL 25 MG RE SUPP
25.0000 mg | RECTAL | Status: DC | PRN
  Filled 2013-02-18: qty 1

## 2013-02-18 MED ORDER — FOLIC ACID 1 MG PO TABS: 1.0000 mg | ORAL_TABLET | Freq: Every morning | ORAL | Status: DC

## 2013-02-18 MED ORDER — MORPHINE SULFATE ER 60 MG PO TBCR
60.0000 mg | EXTENDED_RELEASE_TABLET | Freq: Two times a day (BID) | ORAL | Status: DC | PRN
  Administered 2013-02-18 – 2013-02-19 (×2): 60 mg via ORAL
  Filled 2013-02-18 (×2): qty 1

## 2013-02-18 MED ORDER — HYDROXYUREA 500 MG PO CAPS
2000.0000 mg | ORAL_CAPSULE | Freq: Every morning | ORAL | Status: DC
  Administered 2013-02-19 – 2013-02-21 (×3): 2000 mg via ORAL
  Filled 2013-02-18 (×3): qty 4

## 2013-02-18 MED ORDER — SODIUM CHLORIDE 0.9 % IV BOLUS (SEPSIS)
1000.0000 mL | Freq: Once | INTRAVENOUS | Status: AC
  Administered 2013-02-18: 1000 mL via INTRAVENOUS

## 2013-02-18 MED ORDER — PROMETHAZINE HCL 25 MG PO TABS
25.0000 mg | ORAL_TABLET | ORAL | Status: DC | PRN
  Administered 2013-02-18 – 2013-02-21 (×10): 25 mg via ORAL
  Filled 2013-02-18 (×10): qty 1

## 2013-02-18 NOTE — ED Notes (Signed)
Dr. Izola Price notified of the drug screen result.

## 2013-02-18 NOTE — ED Provider Notes (Signed)
History     CSN: [id number]  Arrival date & time 02/18/13  1408   First MD Initiated Contact with Patient 02/18/13 1424      Chief Complaint  Patient presents with  . Sickle Cell Pain Crisis    (Consider location/radiation/quality/duration/timing/severity/associated sxs/prior treatment) Patient is a 27 y.o. male presenting with sickle cell pain. The history is provided by the patient. No language interpreter was used.  Sickle Cell Pain Crisis Location:  Chest and back Severity:  Severe Similar to previous crisis episodes: yes   Chronicity:  Recurrent History of pulmonary emboli: no   Associated symptoms: chest pain   Associated symptoms: no cough, no fever, no headaches, no nausea, no shortness of breath and no vomiting   Associated symptoms comment:  Symptoms of pain for 2 days without fever. No cough. He has not been vomiting.    Past Medical History  Diagnosis Date  . Sickle cell disease   . Avascular necrosis of femur head, right   . H/O allergic rhinitis   . H/O hypokalemia   . Chronic pain syndrome   . H/O wheezing     with colds    Past Surgical History  Procedure Laterality Date  . Cholecystectomy  1995  . Portacath placement Left 07/04/2012  . Exchange transfusion  02/2008    perioperatively for shoulder surgery  . 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 Sickle cell anemia Brother   . Sickle cell trait Father   . Sickle cell trait Mother   . Diabetes Mellitus II Mother   . Sickle cell anemia Paternal Uncle   . Asthma Neg Hx   . Allergic rhinitis Neg Hx     History  Substance Use Topics  . Smoking status: Never Smoker   . Smokeless tobacco: Never Used  . Alcohol Use: No      Review of Systems  Constitutional: Negative for fever and chills.  Respiratory: Negative.  Negative for cough and shortness of breath.   Cardiovascular: Positive for chest pain.  Gastrointestinal: Negative.  Negative  for nausea and vomiting.  Genitourinary: Negative for dysuria.  Musculoskeletal: Positive for back pain.  Skin: Negative.  Negative for rash.  Neurological: Negative.  Negative for headaches.  Psychiatric/Behavioral: Negative for confusion.    Allergies  Review of patient's allergies indicates no known allergies.  Home Medications   Current Outpatient Rx  Name  Route  Sig  Dispense  Refill  . albuterol (PROVENTIL HFA;VENTOLIN HFA) 108 (90 BASE) MCG/ACT inhaler   Inhalation   Inhale 1 puff into the lungs every 6 (six) hours as needed for wheezing or shortness of breath. wheezing   1 Inhaler   1   . diphenhydrAMINE (BENADRYL) 25 MG tablet   Oral   Take 25 mg by mouth every 8 (eight) hours as needed. For itching.         . folic acid (FOLVITE) 1 MG tablet   Oral   Take 1 mg by mouth every morning.         Marland Kitchen HYDROmorphone (DILAUDID) 4 MG tablet   Oral   Take 1 tablet (4 mg total) by mouth every 4 (four) hours as needed for pain.   30 tablet   0   . hydroxyurea (HYDREA) 500 MG capsule   Oral   Take 2,000 mg by mouth every morning. May take with food to minimize GI side effects.         Marland Kitchen  morphine (MS CONTIN) 60 MG 12 hr tablet   Oral   Take 1 tablet (60 mg total) by mouth 2 (two) times daily as needed for pain.   14 tablet   0   . promethazine (PHENERGAN) 25 MG tablet   Oral   Take 1 tablet (25 mg total) by mouth every 6 (six) hours as needed for nausea.   30 tablet   0   . sennosides-docusate sodium (SENOKOT-S) 8.6-50 MG tablet   Oral   Take 1 tablet by mouth 2 (two) times daily.           BP 111/70  Pulse 90  Temp(Src) 98.6 F (37 C) (Oral)  Resp 16  SpO2 100%  Physical Exam  Constitutional: He is oriented to person, place, and time. He appears well-developed and well-nourished.  HENT:  Head: Normocephalic.  Eyes: Conjunctivae are normal.  No conjunctival pallor.  Neck: Normal range of motion. Neck supple.  Cardiovascular: Normal rate and  regular rhythm.   Pulmonary/Chest: Effort normal and breath sounds normal.  Abdominal: Soft. Bowel sounds are normal. There is no tenderness. There is no rebound and no guarding.  Musculoskeletal: Normal range of motion.  Neurological: He is alert and oriented to person, place, and time.  Skin: Skin is warm and dry. No rash noted.  Psychiatric: He has a normal mood and affect.    ED Course  Procedures (including critical care time)  Labs Reviewed  CBC WITH DIFFERENTIAL - Abnormal; Notable for the following:    RBC 2.88 (*)    Hemoglobin 9.1 (*)    HCT 26.6 (*)    RDW 17.2 (*)    Platelets 459 (*)    Neutrophils Relative % 81 (*)    All other components within normal limits  RETICULOCYTES - Abnormal; Notable for the following:    Retic Ct Pct 10.3 (*)    RBC. 2.88 (*)    Retic Count, Manual 296.6 (*)    All other components within normal limits  BASIC METABOLIC PANEL - Abnormal; Notable for the following:    BUN 5 (*)    All other components within normal limits   Results for orders placed during the hospital encounter of 02/18/13  CBC WITH DIFFERENTIAL      Result Value Range   WBC 8.8  4.0 - 10.5 K/uL   RBC 2.88 (*) 4.22 - 5.81 MIL/uL   Hemoglobin 9.1 (*) 13.0 - 17.0 g/dL   HCT 16.1 (*) 09.6 - 04.5 %   MCV 92.4  78.0 - 100.0 fL   MCH 31.6  26.0 - 34.0 pg   MCHC 34.2  30.0 - 36.0 g/dL   RDW 40.9 (*) 81.1 - 91.4 %   Platelets 459 (*) 150 - 400 K/uL   Neutrophils Relative % 81 (*) 43 - 77 %   Neutro Abs 7.1  1.7 - 7.7 K/uL   Lymphocytes Relative 15  12 - 46 %   Lymphs Abs 1.3  0.7 - 4.0 K/uL   Monocytes Relative 4  3 - 12 %   Monocytes Absolute 0.3  0.1 - 1.0 K/uL   Eosinophils Relative 0  0 - 5 %   Eosinophils Absolute 0.0  0.0 - 0.7 K/uL   Basophils Relative 1  0 - 1 %   Basophils Absolute 0.1  0.0 - 0.1 K/uL  RETICULOCYTES      Result Value Range   Retic Ct Pct 10.3 (*) 0.4 - 3.1 %   RBC.  2.88 (*) 4.22 - 5.81 MIL/uL   Retic Count, Manual 296.6 (*) 19.0 - 186.0  K/uL  BASIC METABOLIC PANEL      Result Value Range   Sodium 140  135 - 145 mEq/L   Potassium 3.6  3.5 - 5.1 mEq/L   Chloride 105  96 - 112 mEq/L   CO2 25  19 - 32 mEq/L   Glucose, Bld 96  70 - 99 mg/dL   BUN 5 (*) 6 - 23 mg/dL   Creatinine, Ser 7.82  0.50 - 1.35 mg/dL   Calcium 9.7  8.4 - 95.6 mg/dL   GFR calc non Af Amer >90  >90 mL/min   GFR calc Af Amer >90  >90 mL/min    Dg Chest 2 View  02/18/2013   *RADIOLOGY REPORT*  Clinical Data: Sickle cell.  Chest pain.  CHEST - 2 VIEW  Comparison: 01/19/2013  Findings: Patient has left-sided power port, tip to the level of the superior vena cava.  Heart size is normal.  There are no focal consolidations or pleural effusions.  Patient has had previous left shoulder arthroplasty.  IMPRESSION: No evidence for acute cardiopulmonary abnormality.   Original Report Authenticated By: Norva Pavlov, M.D.     No diagnosis found.  1. Sickle cell crisis   MDM  Pain is uncontrolled with multiple doses of Dilaudid. Benadryl given for itching. He has essentially unconcerning lab studies. Will admit.        Arnoldo Hooker, PA-C 02/18/13 1838

## 2013-02-18 NOTE — ED Notes (Signed)
[id number]> Expected date:<BR> Expected time:<BR> Means of arrival:<BR> Comments:<BR> Hold

## 2013-02-18 NOTE — H&P (Signed)
[facility] History and Physical  Dennis Bernard RUE:[id number] DOB: [birth year] DOA: 02/18/2013  Referring physician: ED physician PCP: MATTHEWS,MICHELLE A., MD   Chief Complaint: sickle cell crisis   HPI:  Pt is 27 yo male with sickle cell disease, presents to ED with main concern of progressively worsening generalized pain, per him typical of his crisis, started 2 days prior to admission, no specific alleviating or aggravating factors, pain medication by mouth not helping. He denies fevers, chills, chest pain or shortness of breath, no specific abdominal or urinary concerns.   Assessment and Plan:  Sickle cell crisis - will admit pt to medical floor - provide supportive care with IVF, analgesia and antiemetics as needed - oxygen via [place] and heating pads if required to the affected areas - no need for blood transfusion at this time as Hg and Hct stable and at pt's baseline - will ask for UDS   Code Status: Full Family Communication: Pt at bedside Disposition Plan: Admit to medical floor   Review of Systems:  Constitutional: Negative for fever, chills. Negative for diaphoresis.  HENT: Negative for hearing loss, ear pain, nosebleeds, congestion, sore throat, neck pain, tinnitus and ear discharge.   Eyes: Negative for blurred vision, double vision, photophobia, pain, discharge and redness.  Respiratory: Negative for cough, hemoptysis, sputum production, shortness of breath, wheezing and stridor.   Cardiovascular: Negative for chest pain, palpitations, orthopnea, claudication and leg swelling.  Gastrointestinal: Negative for nausea, vomiting and abdominal pain. Negative for heartburn, constipation, blood in stool and melena.  Genitourinary: Negative for dysuria, urgency, frequency, hematuria and flank pain.  Musculoskeletal: Negative for myalgias, and falls.  Skin: Negative for itching and rash.  Neurological:  Negative for tingling, tremors, sensory change, speech change,  focal weakness, loss of consciousness and headaches.  Endo/Heme/Allergies: Negative for environmental allergies and polydipsia. Does not bruise/bleed easily.  Psychiatric/Behavioral: Negative for suicidal ideas. The patient is not nervous/anxious.      Past Medical History  Diagnosis Date  . Sickle cell disease   . Avascular necrosis of femur head, right   . H/O allergic rhinitis   . H/O hypokalemia   . Chronic pain syndrome   . H/O wheezing     with colds    Past Surgical History  Procedure Laterality Date  . Cholecystectomy  1995  . Portacath placement Left 07/04/2012  . Exchange transfusion  02/2008    perioperatively for shoulder surgery  . Total shoulder replacement Left 04/09/2008    Social History:  reports that he has never smoked. He has never used smokeless tobacco. He reports that he does not drink alcohol or use illicit drugs.  No Known Allergies  Family History  Problem Relation Age of Onset  . Sickle cell anemia Brother   . Sickle cell trait Father   . Sickle cell trait Mother   . Diabetes Mellitus II Mother   . Sickle cell anemia Paternal Uncle   . Asthma Neg Hx   . Allergic rhinitis Neg Hx     Prior to Admission medications   Medication Sig Start Date End Date Taking? Authorizing Provider  albuterol (PROVENTIL HFA;VENTOLIN HFA) 108 (90 BASE) MCG/ACT inhaler Inhale 1 puff into the lungs every 6 (six) hours as needed for wheezing or shortness of breath. wheezing 11/27/12  Yes Vassie Loll, MD  diphenhydrAMINE (BENADRYL) 25 MG tablet Take 25 mg by mouth every 8 (eight) hours as needed. For itching. 08/23/12  Yes Dow Adolph, MD  folic acid (FOLVITE) 1 MG  tablet Take 1 mg by mouth every morning. 11/27/12  Yes Vassie Loll, MD  HYDROmorphone (DILAUDID) 4 MG tablet Take 1 tablet (4 mg total) by mouth every 4 (four) hours as needed for pain. 02/14/13  Yes Altha Harm, MD  hydroxyurea (HYDREA) 500 MG capsule Take 2,000 mg by mouth every morning. May take  with food to minimize GI side effects. 11/27/12  Yes Vassie Loll, MD  morphine (MS CONTIN) 60 MG 12 hr tablet Take 1 tablet (60 mg total) by mouth 2 (two) times daily as needed for pain. 02/14/13  Yes Altha Harm, MD  promethazine (PHENERGAN) 25 MG tablet Take 1 tablet (25 mg total) by mouth every 6 (six) hours as needed for nausea. 12/12/12  Yes Alinda Money, MD  sennosides-docusate sodium (SENOKOT-S) 8.6-50 MG tablet Take 1 tablet by mouth 2 (two) times daily. 02/14/13  Yes Altha Harm, MD    Physical Exam: Filed Vitals:   02/18/13 1701 02/18/13 1715 02/18/13 1800 02/18/13 1815  BP: 122/64  111/78   Pulse: 90 97 95 80  Temp:      TempSrc:      Resp: 16     SpO2: 98% 100% 99% 100%    Physical Exam  Constitutional: Appears well-developed and well-nourished. No distress.  HENT: Normocephalic. External right and left ear normal. Oropharynx is clear and moist.  Eyes: Conjunctivae and EOM are normal. PERRLA, no scleral icterus.  Neck: Normal ROM. Neck supple. No JVD. No tracheal deviation. No thyromegaly.  CVS: RRR, S1/S2 +, no murmurs, no gallops, no carotid bruit.  Pulmonary: Effort and breath sounds normal, no stridor, rhonchi, wheezes, rales.  Abdominal: Soft. BS +,  no distension, tenderness, rebound or guarding.  Musculoskeletal: Normal range of motion. No edema. Tenderness to palpation in lower and upper extremities  Lymphadenopathy: No lymphadenopathy noted, cervical, inguinal. Neuro: Alert. Normal reflexes, muscle tone coordination. No cranial nerve deficit. Skin: Skin is warm and dry. No rash noted. Not diaphoretic. No erythema. No pallor.  Psychiatric: Normal mood and affect. Behavior, judgment, thought content normal.   Labs on Admission:  Basic Metabolic Panel:  Recent Labs Lab 02/14/13 1225 02/18/13 1535  NA 137 140  K 3.2* 3.6  CL 102 105  CO2 25 25  GLUCOSE 101* 96  BUN 6 5*  CREATININE 0.64 0.70  CALCIUM 9.7 9.7   Liver Function  Tests:  Recent Labs Lab 02/14/13 1225  AST 26  ALT 12  ALKPHOS 77  BILITOT 2.2*  PROT 7.7  ALBUMIN 4.4   CBC:  Recent Labs Lab 02/14/13 1225 02/18/13 1535  WBC 9.3 8.8  NEUTROABS 4.7 7.1  HGB 8.9* 9.1*  HCT 25.3* 26.6*  MCV 90.7 92.4  PLT 421* 459*   Radiological Exams on Admission: Dg Chest 2 View  02/18/2013   *RADIOLOGY REPORT*  Clinical Data: Sickle cell.  Chest pain.  CHEST - 2 VIEW  Comparison: 01/19/2013  Findings: Patient has left-sided power port, tip to the level of the superior vena cava.  Heart size is normal.  There are no focal consolidations or pleural effusions.  Patient has had previous left shoulder arthroplasty.  IMPRESSION: No evidence for acute cardiopulmonary abnormality.   Original Report Authenticated By: Norva Pavlov, M.D.    EKG: Normal sinus rhythm, no ST/T wave changes  Debbora Presto, MD  [facility] Pager [phone]  If 7PM-7AM, please contact night-coverage [email or web address] Password [facility] 02/18/2013, 7:52 PM

## 2013-02-18 NOTE — ED Notes (Signed)
Pt from home reports that he has had SCC pain x2 days with no relief from home meds. Pt also c/o chest tightness and SOB. Pt O2 sats 100% on RA. Pt also reports back pain. Pt states 10/10. Pt is A&Ox4 and in NAD at this time

## 2013-02-19 LAB — CBC
HCT: 21.8 % — ABNORMAL LOW (ref 39.0–52.0)
Hemoglobin: 7.7 g/dL — ABNORMAL LOW (ref 13.0–17.0)
MCH: 32.8 pg (ref 26.0–34.0)
MCHC: 35.3 g/dL (ref 30.0–36.0)
RBC: 2.35 MIL/uL — ABNORMAL LOW (ref 4.22–5.81)

## 2013-02-19 LAB — COMPREHENSIVE METABOLIC PANEL
ALT: 10 U/L (ref 0–53)
AST: 18 U/L (ref 0–37)
Calcium: 8.5 mg/dL (ref 8.4–10.5)
Creatinine, Ser: 0.76 mg/dL (ref 0.50–1.35)
GFR calc Af Amer: >90 mL/min (ref >90)
GFR calc non Af Amer: >90 mL/min (ref >90)
Sodium: 140 mEq/L (ref 135–145)
Total Protein: 6.3 g/dL (ref 6.0–8.3)

## 2013-02-19 MED ORDER — MORPHINE SULFATE ER 60 MG PO TBCR
60.0000 mg | EXTENDED_RELEASE_TABLET | Freq: Two times a day (BID) | ORAL | Status: DC
  Administered 2013-02-19 – 2013-02-21 (×4): 60 mg via ORAL
  Filled 2013-02-19 (×4): qty 1

## 2013-02-19 MED ORDER — ENOXAPARIN SODIUM 40 MG/0.4ML ~~LOC~~ SOLN
40.0000 mg | SUBCUTANEOUS | Status: DC
  Administered 2013-02-19 – 2013-02-20 (×2): 40 mg via SUBCUTANEOUS
  Filled 2013-02-19 (×3): qty 0.4

## 2013-02-19 NOTE — ED Provider Notes (Signed)
Medical screening examination/treatment/procedure(s) were performed by non-physician practitioner and as supervising physician I was immediately available for consultation/collaboration.   Glynn Octave, MD 02/19/13 [phone]

## 2013-02-19 NOTE — Progress Notes (Signed)
Subjective: 27 year old gentleman admitted with sickle cell painful crisis. Patient is complaining of 9/10 pain involving his chest and lower extremities. No significant shortness of breath, no cough no fever. He was recently in the clinic and has issues to do his narcotic habituation. He was apparently on fentanyl patch which he said he has lost. He has been asked to come to the clinic on Tuesday with the fentanyl patches to establish care. He is at this point not clinic patient but has been given a chance to be a clinic patient if he complies with instructions.  Objective: Vital signs in last 24 hours: Temp:  [97.7 F (36.5 C)-98.7 F (37.1 C)] 98.6 F (37 C) (06/08 2017) Pulse Rate:  [85-99] 97 (06/08 2017) Resp:  [16-18] 16 (06/08 2017) BP: (108-112)/(67-77) 109/69 mmHg (06/08 2017) SpO2:  [95 %-100 %] 95 % (06/08 2017) Weight:  [78.501 kg (173 lb 1 oz)-78.518 kg (173 lb 1.6 oz)] 78.501 kg (173 lb 1 oz) (06/07 2144) Weight change:  Last BM Date: 02/18/13  Intake/Output from previous day: 06/07 0701 - 06/08 0700 In: 240 [P.O.:240] Out: -  Intake/Output this shift:    General appearance: alert, cooperative, appears stated age and no distress Eyes: conjunctivae/corneas clear. PERRL, EOM's intact. Fundi benign. Neck: no adenopathy, no carotid bruit, no JVD, supple, symmetrical, trachea midline and thyroid not enlarged, symmetric, no tenderness/mass/nodules Resp: clear to auscultation bilaterally Chest wall: no tenderness Cardio: regular rate and rhythm, S1, S2 normal, no murmur, click, rub or gallop GI: soft, non-tender; bowel sounds normal; no masses,  no organomegaly Extremities: extremities normal, atraumatic, no cyanosis or edema Skin: Skin color, texture, turgor normal. No rashes or lesions Neurologic: Grossly normal  Lab Results:  Recent Labs  02/18/13 1535 02/19/13 0615  WBC 8.8 12.6*  HGB 9.1* 7.7*  HCT 26.6* 21.8*  PLT 459* 349   BMET  Recent Labs   02/18/13 1535 02/19/13 0615  NA 140 140  K 3.6 3.2*  CL 105 107  CO2 25 27  GLUCOSE 96 92  BUN 5* 7  CREATININE 0.70 0.76  CALCIUM 9.7 8.5    Studies/Results: Dg Chest 2 View  02/18/2013   *RADIOLOGY REPORT*  Clinical Data: Sickle cell.  Chest pain.  CHEST - 2 VIEW  Comparison: 01/19/2013  Findings: Patient has left-sided power port, tip to the level of the superior vena cava.  Heart size is normal.  There are no focal consolidations or pleural effusions.  Patient has had previous left shoulder arthroplasty.  IMPRESSION: No evidence for acute cardiopulmonary abnormality.   Original Report Authenticated By: Norva Pavlov, M.D.    Medications: I have reviewed the patient's current medications.  Assessment/Plan: 27 year old gentleman admitted with sickle cell painful crisis and sickle cell anemia.  #1 sickle cell painful crisis: Patient is currently on Dilaudid 4 mg IV every 2 hours. He's also on his hydroxyurea and has MS Contin. The MS Contin is when necessary but I would change it to schedule. I will keep the 4 mg Dilaudid for today and begin titration down tomorrow. I will also add Toradol to his current regimen.  #2 sickle cell anemia: His hemoglobin has dropped from 9.1-7.7. It may be mainly delusional but could be mild hemolysis. We will follow closely.  #3 chronic pain syndrome: Patient is a pain is not entirely due to sickle cell disease. He might need chronic pain management after discharge.  #4 history of medication noncompliance: Counseling has been provided and will continue to be provided.  #  5 hypokalemia: Replete his potassium. This may be secondary to albuterol inhalation.  LOS: 1 day   Jodell Weitman,LAWAL 02/19/2013, 9:16 PM

## 2013-02-20 LAB — COMPREHENSIVE METABOLIC PANEL
ALT: 10 U/L (ref 0–53)
Alkaline Phosphatase: 68 U/L (ref 39–117)
BUN: 7 mg/dL (ref 6–23)
CO2: 25 mEq/L (ref 19–32)
GFR calc Af Amer: >90 mL/min (ref >90)
GFR calc non Af Amer: >90 mL/min (ref >90)
Glucose, Bld: 93 mg/dL (ref 70–99)
Potassium: 3.8 mEq/L (ref 3.5–5.1)
Sodium: 140 mEq/L (ref 135–145)
Total Bilirubin: 1.2 mg/dL (ref 0.3–1.2)
Total Protein: 6.6 g/dL (ref 6.0–8.3)

## 2013-02-20 LAB — CBC
MCHC: 34 g/dL (ref 30.0–36.0)
Platelets: 371 10*3/uL (ref 150–400)
RDW: 16.7 % — ABNORMAL HIGH (ref 11.5–15.5)
WBC: 11.2 10*3/uL — ABNORMAL HIGH (ref 4.0–10.5)

## 2013-02-20 MED ORDER — HYDROMORPHONE HCL PF 2 MG/ML IJ SOLN
2.0000 mg | INTRAMUSCULAR | Status: DC | PRN
  Administered 2013-02-20 – 2013-02-21 (×11): 2 mg via INTRAVENOUS
  Filled 2013-02-20 (×12): qty 1

## 2013-02-20 MED ORDER — KETOROLAC TROMETHAMINE 30 MG/ML IJ SOLN
30.0000 mg | Freq: Four times a day (QID) | INTRAMUSCULAR | Status: DC
  Administered 2013-02-20 – 2013-02-21 (×4): 30 mg via INTRAVENOUS
  Filled 2013-02-20 (×7): qty 1

## 2013-02-20 NOTE — Progress Notes (Signed)
Subjective: Patient is still complaining of 6-7/10 pain mainly involving his chest wall. He denies shortness of breath or cough. Patient is slightly better but he doesn't feel comfortable going home. He has been able to move around and eat without any problem. No nausea vomiting or diarrhea, he has been getting his medications on schedule.  Objective: Vital signs in last 24 hours: Temp:  [98 F (36.7 C)-98.6 F (37 C)] 98 F (36.7 C) (06/09 0923) Pulse Rate:  [74-97] 74 (06/09 0923) Resp:  [16-18] 16 (06/09 0923) BP: (108-114)/(69-79) 108/71 mmHg (06/09 0923) SpO2:  [95 %-99 %] 99 % (06/09 0923) Weight:  [79.198 kg (174 lb 9.6 oz)] 79.198 kg (174 lb 9.6 oz) (06/09 0602) Weight change: 0.68 kg (1 lb 8 oz) Last BM Date: 02/18/13  Intake/Output from previous day: 06/08 0701 - 06/09 0700 In: 2577 [P.O.:240; I.V.:2337] Out: -  Intake/Output this shift:    General appearance: alert, cooperative, appears stated age and no distress Eyes: conjunctivae/corneas clear. PERRL, EOM's intact. Fundi benign. Neck: no adenopathy, no carotid bruit, no JVD, supple, symmetrical, trachea midline and thyroid not enlarged, symmetric, no tenderness/mass/nodules Resp: clear to auscultation bilaterally Chest wall: no tenderness Cardio: regular rate and rhythm, S1, S2 normal, no murmur, click, rub or gallop GI: soft, non-tender; bowel sounds normal; no masses,  no organomegaly Extremities: extremities normal, atraumatic, no cyanosis or edema Skin: Skin color, texture, turgor normal. No rashes or lesions Neurologic: Grossly normal  Lab Results:  Recent Labs  02/19/13 0615 02/20/13 0505  WBC 12.6* 11.2*  HGB 7.7* 8.1*  HCT 21.8* 23.8*  PLT 349 371   BMET  Recent Labs  02/19/13 0615 02/20/13 0505  NA 140 140  K 3.2* 3.8  CL 107 108  CO2 27 25  GLUCOSE 92 93  BUN 7 7  CREATININE 0.76 0.67  CALCIUM 8.5 8.8    Studies/Results: Dg Chest 2 View  02/18/2013   *RADIOLOGY REPORT*  Clinical  Data: Sickle cell.  Chest pain.  CHEST - 2 VIEW  Comparison: 01/19/2013  Findings: Patient has left-sided power port, tip to the level of the superior vena cava.  Heart size is normal.  There are no focal consolidations or pleural effusions.  Patient has had previous left shoulder arthroplasty.  IMPRESSION: No evidence for acute cardiopulmonary abnormality.   Original Report Authenticated By: Norva Pavlov, M.D.    Medications: I have reviewed the patient's current medications.  Assessment/Plan: 27 year old gentleman admitted with sickle cell painful crisis and sickle cell anemia.  #1 sickle cell painful crisis: I will reduce the dose of his Dilaudid to 2 mg IV every 2 hours. We'll continue with Toradol and MS Contin. I will add oral oxycodone for breakthrough pain in preparation for possible discharge in the morning  #2 sickle cell anemia: His hemoglobin is now 8.1 and seems to be close to his baseline. We will recheck the level in the morning.  #3 chronic pain syndrome: Continue current therapy and further counseling including outpatient chronic pain management  #4 history of medication noncompliance: Continue counseling.  #5 hypokalemia: Repleted   LOS: 2 days   GARBA,LAWAL 02/20/2013, 1:01 PM

## 2013-02-21 ENCOUNTER — Ambulatory Visit: Payer: Medicare Other | Admitting: Internal Medicine

## 2013-02-21 ENCOUNTER — Emergency Department: Payer: Self-pay | Admitting: Emergency Medicine

## 2013-02-21 DIAGNOSIS — Z91199 Patient's noncompliance with other medical treatment and regimen due to unspecified reason: Secondary | ICD-10-CM

## 2013-02-21 DIAGNOSIS — Z9119 Patient's noncompliance with other medical treatment and regimen: Secondary | ICD-10-CM

## 2013-02-21 DIAGNOSIS — D5700 Hb-SS disease with crisis, unspecified: Principal | ICD-10-CM

## 2013-02-21 LAB — CBC WITH DIFFERENTIAL/PLATELET
Basophil #: 0.1 10*3/uL (ref 0.0–0.1)
Basophils Absolute: 0 10*3/uL (ref 0.0–0.1)
Eosinophil #: 0.1 10*3/uL (ref 0.0–0.7)
Eosinophil %: 0.9 %
HCT: 23.2 % — ABNORMAL LOW (ref 39.0–52.0)
HGB: 8.6 g/dL — ABNORMAL LOW (ref 13.0–18.0)
Lymphs Abs: 7 10*3/uL — ABNORMAL HIGH (ref 0.7–4.0)
MCH: 32 pg (ref 26.0–34.0)
MCH: 33.1 pg (ref 26.0–34.0)
MCV: 92.8 fL (ref 78.0–100.0)
Monocyte %: 3.6 %
Monocytes Absolute: 0.5 10*3/uL (ref 0.1–1.0)
Monocytes Relative: 4 % (ref 3–12)
Neutro Abs: 4.9 10*3/uL (ref 1.7–7.7)
Neutrophil #: 8.4 10*3/uL — ABNORMAL HIGH (ref 1.4–6.5)
Platelet: 363 10*3/uL (ref 150–440)
RDW: 15.9 % — ABNORMAL HIGH (ref 11.5–15.5)
RDW: 15.8 % — ABNORMAL HIGH (ref 11.5–14.5)
WBC: 12.5 10*3/uL — ABNORMAL HIGH (ref 4.0–10.5)

## 2013-02-21 LAB — URINALYSIS, COMPLETE
Bacteria: NONE SEEN
Bilirubin,UR: NEGATIVE
Glucose,UR: NEGATIVE mg/dL (ref 0–75)
Ketone: NEGATIVE
Nitrite: NEGATIVE
Ph: 5 (ref 4.5–8.0)
Protein: NEGATIVE
RBC,UR: NONE SEEN /HPF (ref 0–5)
Squamous Epithelial: NONE SEEN
WBC UR: <1 /HPF (ref 0–5)

## 2013-02-21 LAB — COMPREHENSIVE METABOLIC PANEL
ALT: 11 U/L (ref 0–53)
AST: 27 U/L (ref 0–37)
Albumin: 3.8 g/dL (ref 3.5–5.2)
Alkaline Phosphatase: 66 U/L (ref 39–117)
Anion Gap: 7 (ref 7–16)
BUN: 7 mg/dL (ref 6–23)
Bilirubin,Total: 1.5 mg/dL — ABNORMAL HIGH (ref 0.2–1.0)
Calcium, Total: 8.9 mg/dL (ref 8.5–10.1)
Chloride: 108 mEq/L (ref 96–112)
Chloride: 110 mmol/L — ABNORMAL HIGH (ref 98–107)
Co2: 26 mmol/L (ref 21–32)
EGFR (African American): >60
EGFR (Non-African Amer.): >60
Glucose: 86 mg/dL (ref 65–99)
Osmolality: 282 (ref 275–301)
Potassium: 3.9 mEq/L (ref 3.5–5.1)
SGOT(AST): 24 U/L (ref 15–37)
Sodium: 141 mEq/L (ref 135–145)
Sodium: 143 mmol/L (ref 136–145)
Total Bilirubin: 1.1 mg/dL (ref 0.3–1.2)
Total Protein: 6.3 g/dL (ref 6.0–8.3)
Total Protein: 7.1 g/dL (ref 6.4–8.2)

## 2013-02-21 LAB — DRUG SCREEN, URINE
Cocaine Metabolite,Ur ~~LOC~~: NEGATIVE (ref ?–300)
Methadone, Ur Screen: NEGATIVE (ref ?–300)
Phencyclidine (PCP) Ur S: NEGATIVE (ref ?–25)
Tricyclic, Ur Screen: POSITIVE (ref ?–1000)

## 2013-02-21 LAB — RETICULOCYTES
Absolute Retic Count: 0.1269 10*6/uL
Reticulocyte: 4.91 % — ABNORMAL HIGH

## 2013-02-21 MED ORDER — HEPARIN SOD (PORK) LOCK FLUSH 100 UNIT/ML IV SOLN
INTRAVENOUS | Status: AC
  Filled 2013-02-21: qty 5

## 2013-02-21 MED ORDER — HYDROMORPHONE HCL PF 2 MG/ML IJ SOLN: 2.0000 mg | INTRAMUSCULAR | Status: DC | PRN

## 2013-02-21 MED ORDER — HYDROMORPHONE HCL 4 MG PO TABS
4.0000 mg | ORAL_TABLET | ORAL | Status: DC
  Administered 2013-02-21: 4 mg via ORAL
  Filled 2013-02-21: qty 1

## 2013-02-21 MED ORDER — POLYETHYLENE GLYCOL 3350 17 G PO PACK
17.0000 g | PACK | Freq: Every day | ORAL | Status: DC | PRN
  Filled 2013-02-21: qty 1

## 2013-02-21 NOTE — Progress Notes (Signed)
Pt D/C home, alert and oriented, no new complains, vitals within normal range for pt. D/C instructions and medication instructions done, pt verbalizes understanding.
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
## 2013-02-21 NOTE — Social Work (Signed)
Clinical Social Worker accompanied Attending MD in to visit with patient.  Attending MD explained to patient challenges with compliance with taking medications as directed and how these challenges can lead to dismissal from this clinic for care and treatment.  CSWN identified with patient the importance of open and honest communication in providing appropriate care and treatment.  CSWN also shared with patient the opportunity to continue treatment with counseling that required weekly visits to the clinic to see CSWN and MD/NP for care and medication management.   CSWN will continue to follow outpatient.    Cathleen Fears MSW, LCSW [phone]

## 2013-02-21 NOTE — Social Work (Signed)
Clinical Social work Haematologist revisited patient after Attending MD shared that patient's records show that he has been getting multiple prescriptions filled by various providers.  CSWN met with patient for ongoing support for discharge. Patient will be dismissed from services through the [facility]. CSWN explained dismissal due to patient getting multiple prescriptions from various medical entities and patient acknowledged such.  CSWN encouraged patient to identify a new physician.  CSWN recommended beginning with a community clinic in [place].  CSWN also recommended that patient get connected to the community based sickle cell agency.  CSWN also recommended that patient seek counseling support to work on history of emotional challenges.  Through this entire process patient was encouraged to be honest about his medical history to develop good professional treatment relationships with a new team of professionals.    No follow up needed.  Beverly Sessions MSW, LCSW [phone]  60 min

## 2013-03-02 IMAGING — CR DG CHEST 2V
1 series · 2 of 2 positions shown · non-contrast
Comparison: none

REASON FOR EXAM: CHEST PAIN
COMMENTS:

PROCEDURE:     DXR - DXR CHEST PA (OR AP) AND LATERAL  - September 30, 2012  [DATE]
RESULT:     Comparison: 09/18/2012

[Series 1: pa · 0.17mm/px · 2 of 2 slices shown]
[im 1/2]
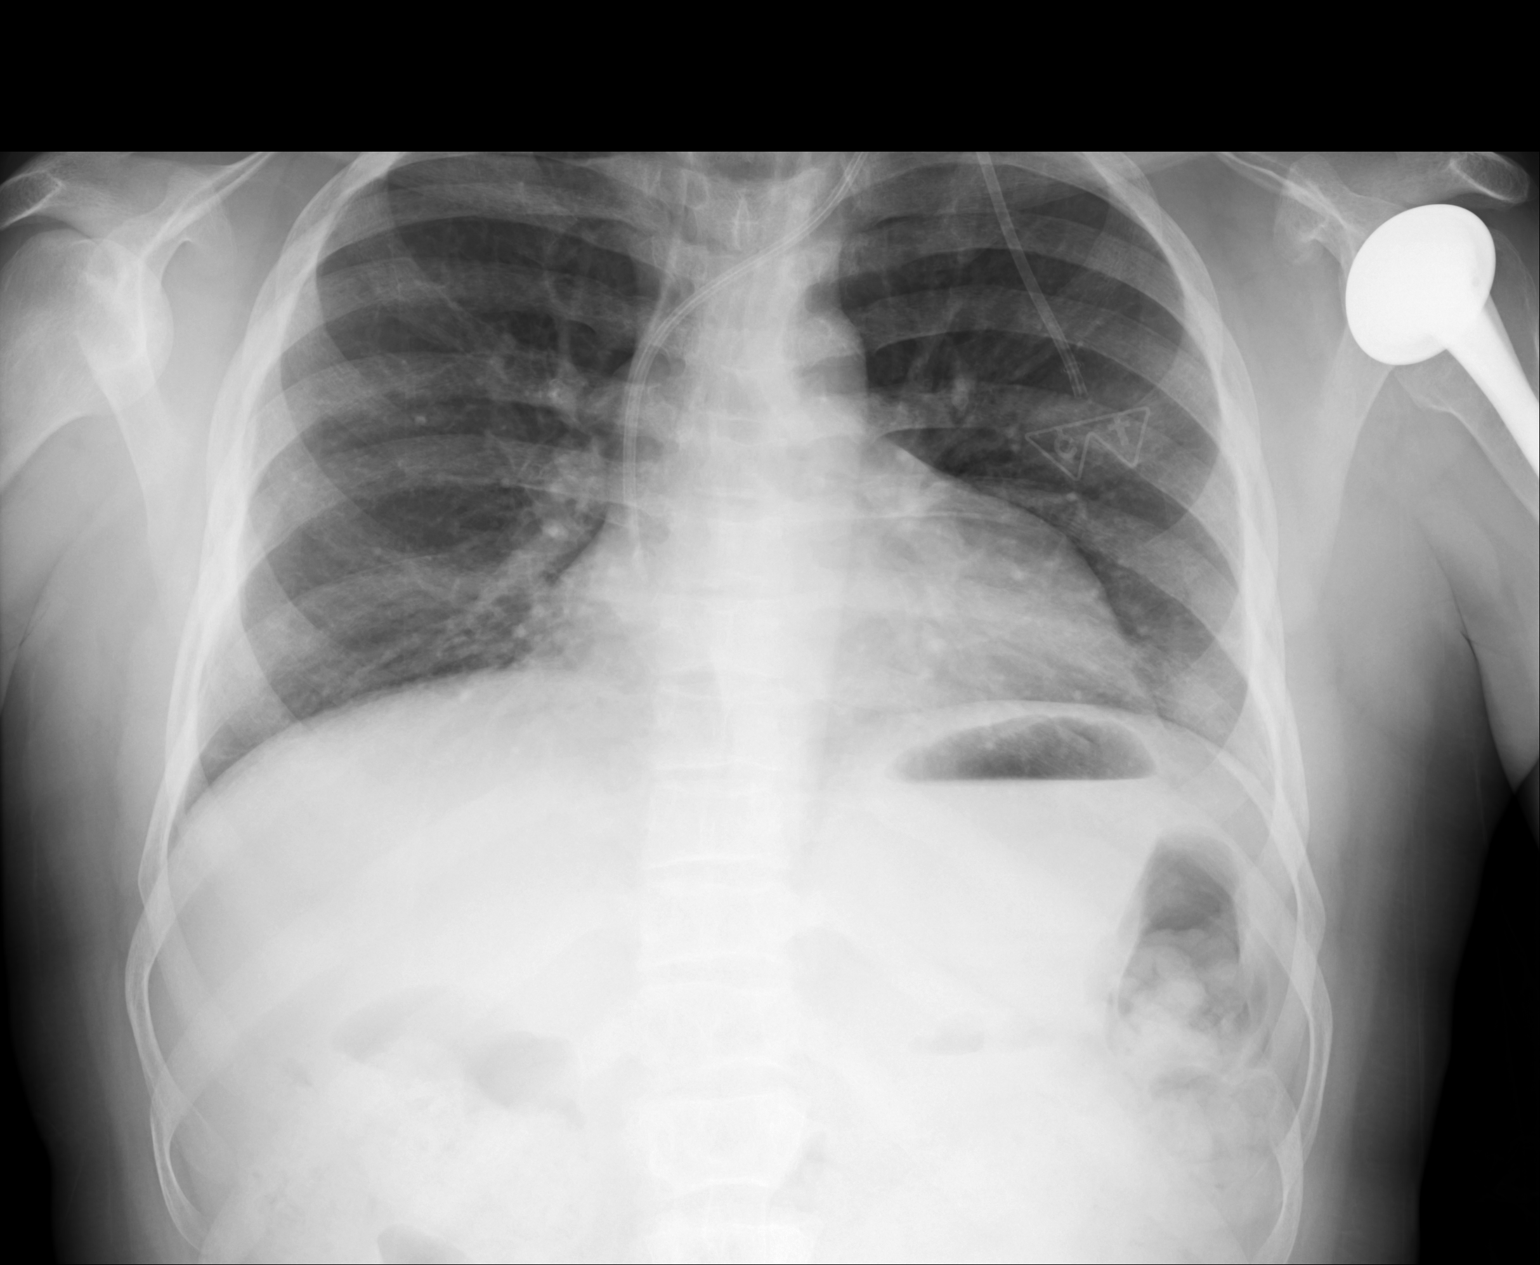
[im 2/2]
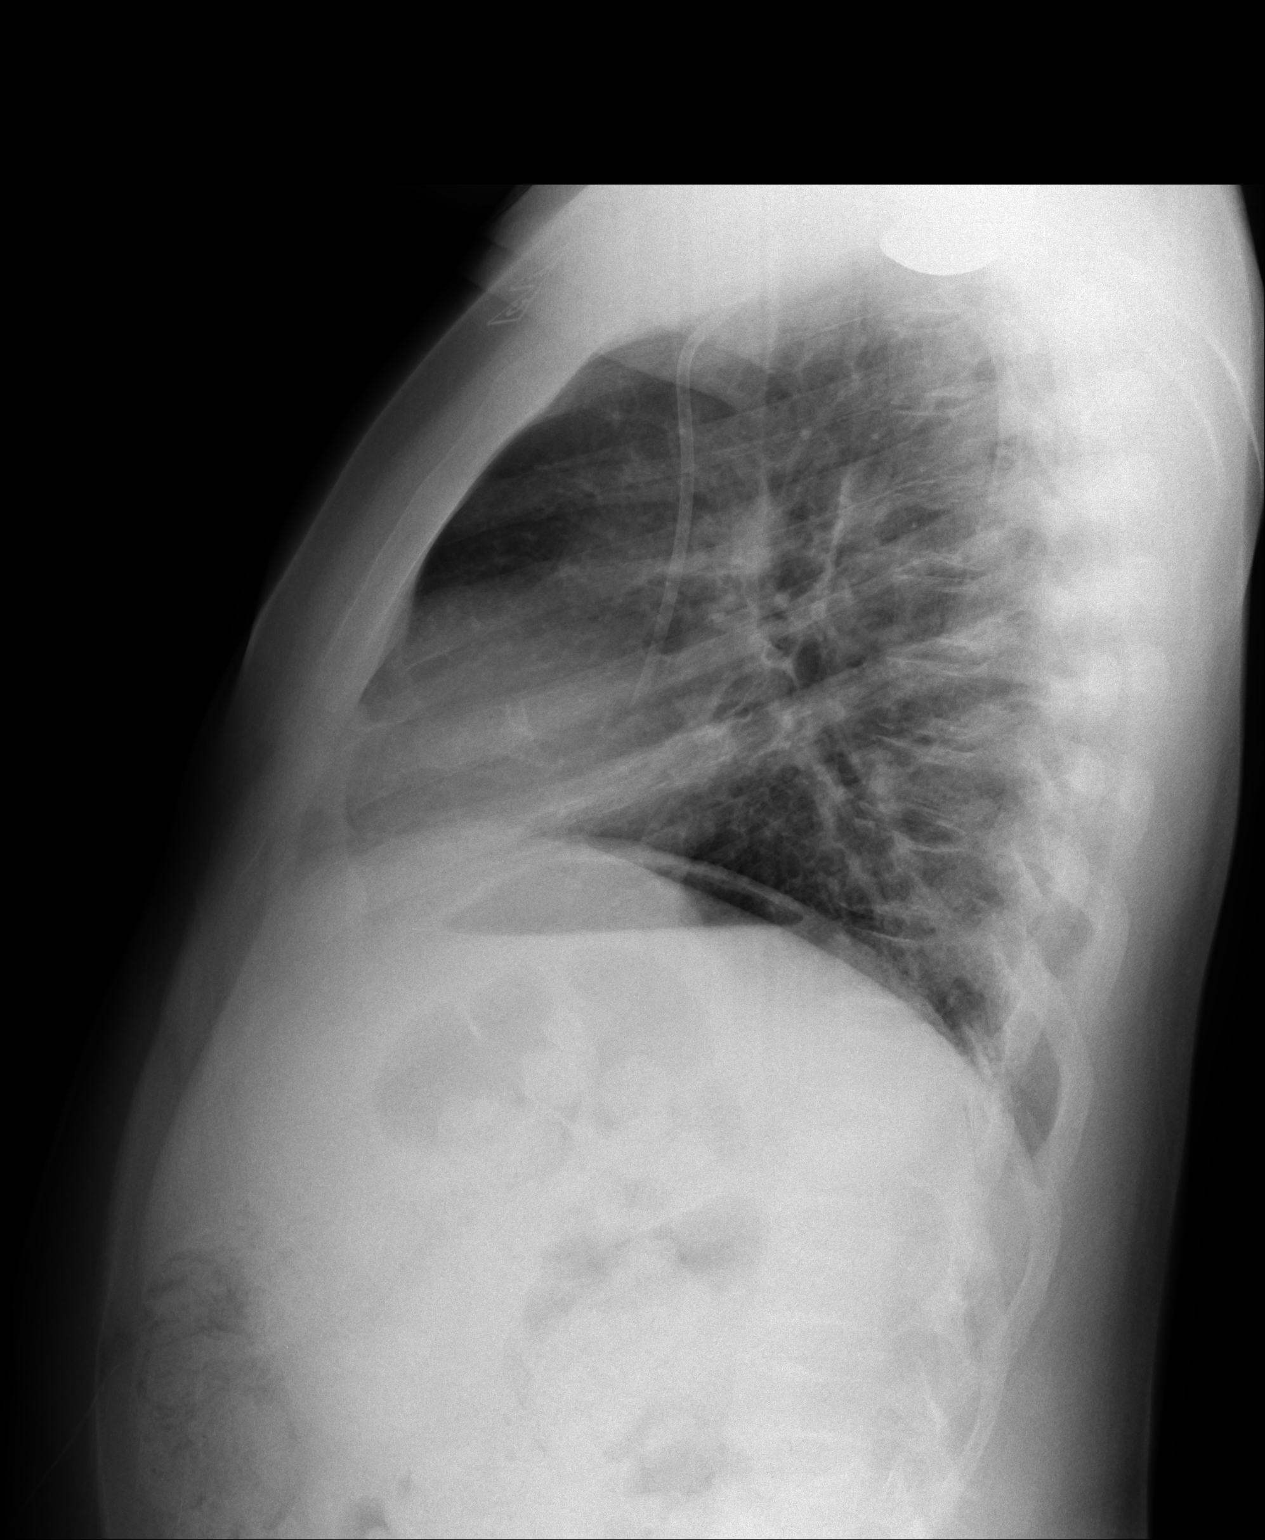

[2 of 2 positions shown; findings below may reference images not displayed]

FINDINGS: The heart and mediastinum are stable. Left IJ portacatheter tip terminates
over the right atrium. The lung volumes are low. Mild basilar opacities are
likely secondary to atelectasis.
IMPRESSION: Mild basilar opacities are likely secondary to atelectasis given the low
lung lines. Infection OB difficult to exclude. Followup PA and lateral chest
radiograph can be performed if symptoms persi[REDACTED]

## 2013-03-23 ENCOUNTER — Encounter (HOSPITAL_COMMUNITY): Payer: Self-pay | Admitting: Emergency Medicine

## 2013-03-23 ENCOUNTER — Inpatient Hospital Stay (HOSPITAL_COMMUNITY)
Admission: EM | Admit: 2013-03-23 | Discharge: 2013-03-27 | DRG: 812 | Disposition: A | Discharge status: Home / Self Care | Payer: Medicare Other | Attending: Internal Medicine | Admitting: Internal Medicine

## 2013-03-23 ENCOUNTER — Inpatient Hospital Stay (HOSPITAL_COMMUNITY): Payer: Medicare Other

## 2013-03-23 DIAGNOSIS — R2232 Localized swelling, mass and lump, left upper limb: Secondary | ICD-10-CM

## 2013-03-23 DIAGNOSIS — Z9119 Patient's noncompliance with other medical treatment and regimen: Secondary | ICD-10-CM

## 2013-03-23 DIAGNOSIS — M7989 Other specified soft tissue disorders: Secondary | ICD-10-CM | POA: Diagnosis present

## 2013-03-23 DIAGNOSIS — M87059 Idiopathic aseptic necrosis of unspecified femur: Secondary | ICD-10-CM | POA: Diagnosis present

## 2013-03-23 DIAGNOSIS — M549 Dorsalgia, unspecified: Secondary | ICD-10-CM

## 2013-03-23 DIAGNOSIS — D5700 Hb-SS disease with crisis, unspecified: Principal | ICD-10-CM | POA: Diagnosis present

## 2013-03-23 DIAGNOSIS — Z9114 Patient's other noncompliance with medication regimen: Secondary | ICD-10-CM

## 2013-03-23 DIAGNOSIS — Z87898 Personal history of other specified conditions: Secondary | ICD-10-CM

## 2013-03-23 DIAGNOSIS — Z91199 Patient's noncompliance with other medical treatment and regimen due to unspecified reason: Secondary | ICD-10-CM

## 2013-03-23 DIAGNOSIS — F1120 Opioid dependence, uncomplicated: Secondary | ICD-10-CM | POA: Diagnosis present

## 2013-03-23 DIAGNOSIS — G8929 Other chronic pain: Secondary | ICD-10-CM

## 2013-03-23 DIAGNOSIS — D571 Sickle-cell disease without crisis: Secondary | ICD-10-CM | POA: Diagnosis present

## 2013-03-23 DIAGNOSIS — G894 Chronic pain syndrome: Secondary | ICD-10-CM | POA: Diagnosis present

## 2013-03-23 DIAGNOSIS — R229 Localized swelling, mass and lump, unspecified: Secondary | ICD-10-CM

## 2013-03-23 LAB — COMPREHENSIVE METABOLIC PANEL
CO2: 24 mEq/L (ref 19–32)
Calcium: 9.1 mg/dL (ref 8.4–10.5)
Creatinine, Ser: 0.68 mg/dL (ref 0.50–1.35)
GFR calc Af Amer: >90 mL/min (ref >90)
GFR calc non Af Amer: >90 mL/min (ref >90)
Glucose, Bld: 96 mg/dL (ref 70–99)

## 2013-03-23 LAB — CBC
HCT: 24 % — ABNORMAL LOW (ref 39.0–52.0)
Hemoglobin: 8.4 g/dL — ABNORMAL LOW (ref 13.0–17.0)
MCH: 33.6 pg (ref 26.0–34.0)
MCHC: 34 g/dL (ref 30.0–36.0)
MCV: 98.8 fL (ref 78.0–100.0)
MCV: 98 fL (ref 78.0–100.0)
Platelets: 396 10*3/uL (ref 150–400)
Platelets: 455 10*3/uL — ABNORMAL HIGH (ref 150–400)
RBC: 2.5 MIL/uL — ABNORMAL LOW (ref 4.22–5.81)
RBC: 2.45 MIL/uL — ABNORMAL LOW (ref 4.22–5.81)
RDW: 16.8 % — ABNORMAL HIGH (ref 11.5–15.5)
WBC: 12.1 10*3/uL — ABNORMAL HIGH (ref 4.0–10.5)

## 2013-03-23 LAB — RETICULOCYTES
RBC.: 2.5 MIL/uL — ABNORMAL LOW (ref 4.22–5.81)
Retic Count, Absolute: 382.5 10*3/uL — ABNORMAL HIGH (ref 19.0–186.0)
Retic Ct Pct: 15.3 % — ABNORMAL HIGH (ref 0.4–3.1)

## 2013-03-23 LAB — CREATININE, SERUM
GFR calc Af Amer: >90 mL/min (ref >90)
GFR calc non Af Amer: >90 mL/min (ref >90)

## 2013-03-23 MED ORDER — HEPARIN SODIUM (PORCINE) 5000 UNIT/ML IJ SOLN
5000.0000 [IU] | Freq: Three times a day (TID) | INTRAMUSCULAR | Status: DC
  Administered 2013-03-23 – 2013-03-27 (×12): 5000 [IU] via SUBCUTANEOUS
  Filled 2013-03-23 (×14): qty 1

## 2013-03-23 MED ORDER — ONDANSETRON HCL 4 MG/2ML IJ SOLN: 4.0000 mg | Freq: Three times a day (TID) | INTRAMUSCULAR | Status: AC | PRN

## 2013-03-23 MED ORDER — SENNA-DOCUSATE SODIUM 8.6-50 MG PO TABS: 1.0000 | ORAL_TABLET | Freq: Two times a day (BID) | ORAL | Status: DC

## 2013-03-23 MED ORDER — KETOROLAC TROMETHAMINE 30 MG/ML IJ SOLN
30.0000 mg | Freq: Four times a day (QID) | INTRAMUSCULAR | Status: DC | PRN
  Administered 2013-03-23 – 2013-03-27 (×5): 30 mg via INTRAVENOUS
  Filled 2013-03-23 (×5): qty 1

## 2013-03-23 MED ORDER — HYDROXYUREA 500 MG PO CAPS
2000.0000 mg | ORAL_CAPSULE | Freq: Every morning | ORAL | Status: DC
  Administered 2013-03-24 – 2013-03-27 (×4): 2000 mg via ORAL
  Filled 2013-03-23 (×4): qty 4

## 2013-03-23 MED ORDER — DIPHENHYDRAMINE HCL 50 MG/ML IJ SOLN
25.0000 mg | Freq: Once | INTRAMUSCULAR | Status: AC
  Administered 2013-03-23: 25 mg via INTRAVENOUS
  Filled 2013-03-23: qty 1

## 2013-03-23 MED ORDER — HYDROMORPHONE HCL PF 2 MG/ML IJ SOLN
2.0000 mg | Freq: Once | INTRAMUSCULAR | Status: AC
  Administered 2013-03-23: 2 mg via INTRAVENOUS
  Filled 2013-03-23: qty 1

## 2013-03-23 MED ORDER — FOLIC ACID 1 MG PO TABS
1.0000 mg | ORAL_TABLET | Freq: Every morning | ORAL | Status: DC
  Administered 2013-03-24 – 2013-03-27 (×4): 1 mg via ORAL
  Filled 2013-03-23 (×4): qty 1

## 2013-03-23 MED ORDER — DIPHENHYDRAMINE HCL 25 MG PO CAPS
25.0000 mg | ORAL_CAPSULE | Freq: Three times a day (TID) | ORAL | Status: DC | PRN
  Administered 2013-03-24: 25 mg via ORAL
  Filled 2013-03-23: qty 1

## 2013-03-23 MED ORDER — ALBUTEROL SULFATE HFA 108 (90 BASE) MCG/ACT IN AERS
1.0000 | INHALATION_SPRAY | Freq: Four times a day (QID) | RESPIRATORY_TRACT | Status: DC | PRN
  Filled 2013-03-23: qty 6.7

## 2013-03-23 MED ORDER — ONDANSETRON HCL 4 MG/2ML IJ SOLN
4.0000 mg | Freq: Four times a day (QID) | INTRAMUSCULAR | Status: DC | PRN
  Administered 2013-03-25 – 2013-03-26 (×3): 4 mg via INTRAVENOUS
  Filled 2013-03-23 (×3): qty 2

## 2013-03-23 MED ORDER — HYDROMORPHONE HCL PF 1 MG/ML IJ SOLN
1.0000 mg | INTRAMUSCULAR | Status: AC | PRN
  Administered 2013-03-23 – 2013-03-24 (×2): 1 mg via INTRAVENOUS
  Filled 2013-03-23 (×2): qty 1

## 2013-03-23 MED ORDER — SENNOSIDES-DOCUSATE SODIUM 8.6-50 MG PO TABS
1.0000 | ORAL_TABLET | Freq: Two times a day (BID) | ORAL | Status: DC
  Administered 2013-03-23 – 2013-03-27 (×8): 1 via ORAL
  Filled 2013-03-23 (×9): qty 1

## 2013-03-23 MED ORDER — SODIUM CHLORIDE 0.9 % IJ SOLN: 9.0000 mL | INTRAMUSCULAR | Status: DC | PRN

## 2013-03-23 MED ORDER — PROMETHAZINE HCL 25 MG PO TABS: 25.0000 mg | ORAL_TABLET | Freq: Four times a day (QID) | ORAL | Status: DC | PRN

## 2013-03-23 MED ORDER — SODIUM CHLORIDE 0.9 % IV BOLUS (SEPSIS)
1000.0000 mL | Freq: Once | INTRAVENOUS | Status: AC
  Administered 2013-03-23: 1000 mL via INTRAVENOUS

## 2013-03-23 MED ORDER — DIPHENHYDRAMINE HCL 12.5 MG/5ML PO ELIX
12.5000 mg | ORAL_SOLUTION | Freq: Four times a day (QID) | ORAL | Status: DC | PRN
  Administered 2013-03-25: 12.5 mg via ORAL
  Filled 2013-03-23: qty 5

## 2013-03-23 MED ORDER — ONDANSETRON HCL 4 MG/2ML IJ SOLN
4.0000 mg | Freq: Once | INTRAMUSCULAR | Status: AC
  Administered 2013-03-23: 4 mg via INTRAVENOUS
  Filled 2013-03-23: qty 2

## 2013-03-23 MED ORDER — NALOXONE HCL 0.4 MG/ML IJ SOLN: 0.4000 mg | INTRAMUSCULAR | Status: DC | PRN

## 2013-03-23 MED ORDER — DIPHENHYDRAMINE HCL 50 MG/ML IJ SOLN
12.5000 mg | Freq: Four times a day (QID) | INTRAMUSCULAR | Status: DC | PRN
  Administered 2013-03-23 – 2013-03-27 (×11): 12.5 mg via INTRAVENOUS
  Filled 2013-03-23 (×11): qty 1

## 2013-03-23 MED ORDER — SODIUM CHLORIDE 0.9 % IV SOLN: INTRAVENOUS | Status: AC

## 2013-03-23 MED ORDER — HYDROMORPHONE 0.3 MG/ML IV SOLN
INTRAVENOUS | Status: DC
  Administered 2013-03-23 (×2): via INTRAVENOUS
  Administered 2013-03-24: 2.64 mg via INTRAVENOUS
  Administered 2013-03-24: 06:00:00 via INTRAVENOUS
  Administered 2013-03-24: 23 mL via INTRAVENOUS
  Administered 2013-03-24: 0.3 mg via INTRAVENOUS
  Administered 2013-03-24: 6.99 mg via INTRAVENOUS
  Administered 2013-03-24: 3.99 mg via INTRAVENOUS
  Administered 2013-03-24: 17:00:00 via INTRAVENOUS
  Administered 2013-03-25: 7.79 mg via INTRAVENOUS
  Administered 2013-03-25: 20:00:00 via INTRAVENOUS
  Administered 2013-03-25: 7.99 mg via INTRAVENOUS
  Administered 2013-03-25: 03:00:00 via INTRAVENOUS
  Administered 2013-03-25: 12.49 mg via INTRAVENOUS
  Administered 2013-03-25: 13:00:00 via INTRAVENOUS
  Administered 2013-03-25: 2.49 mg via INTRAVENOUS
  Administered 2013-03-25: 17:00:00 via INTRAVENOUS
  Administered 2013-03-26: 0.3 mg via INTRAVENOUS
  Administered 2013-03-26: via INTRAVENOUS
  Administered 2013-03-26: 7.52 mg via INTRAVENOUS
  Administered 2013-03-26: 07:00:00 via INTRAVENOUS
  Administered 2013-03-26: 5.99 mg via INTRAVENOUS
  Administered 2013-03-26: 7.61 mg via INTRAVENOUS
  Administered 2013-03-26: 6.99 mg via INTRAVENOUS
  Filled 2013-03-23 (×14): qty 25

## 2013-03-23 MED ORDER — MORPHINE SULFATE ER 60 MG PO TBCR
60.0000 mg | EXTENDED_RELEASE_TABLET | Freq: Two times a day (BID) | ORAL | Status: DC
  Administered 2013-03-23 – 2013-03-27 (×8): 60 mg via ORAL
  Filled 2013-03-23 (×8): qty 1

## 2013-03-23 MED ORDER — DIPHENHYDRAMINE HCL 25 MG PO TABS
25.0000 mg | ORAL_TABLET | Freq: Three times a day (TID) | ORAL | Status: DC | PRN
  Filled 2013-03-23: qty 1

## 2013-03-23 MED ORDER — MAGNESIUM HYDROXIDE 400 MG/5ML PO SUSP: 30.0000 mL | Freq: Every day | ORAL | Status: DC | PRN

## 2013-03-23 MED ORDER — DEXTROSE-NACL 5-0.45 % IV SOLN
INTRAVENOUS | Status: DC
  Administered 2013-03-23: 100 mL via INTRAVENOUS
  Administered 2013-03-24 – 2013-03-27 (×8): via INTRAVENOUS

## 2013-03-23 NOTE — ED Notes (Signed)
Pt also complains of back pain at this time.

## 2013-03-23 NOTE — ED Notes (Signed)
MD at bedside. 

## 2013-03-23 NOTE — H&P (Signed)
[facility] History and Physical  Eliel Dudding ZOX:[id number] DOB: 1986/04/03 DOA: 03/23/2013  Referring physician: Dr. Blinda Leatherwood PCP: No primary provider on file.  Specialists: none  Chief Complaint: arm pain and back pain  HPI: Dennis Bernard is a 27 y.o. male  Past medical history of sickle cell disease discharged from the hospital 02/19/2012 that comes in for left arm pain and low back pain that begins 2 days prior to admission. He relates this pain is similar to his sickle cell acute crisis pain. He relates he took his medications at home without any relief. Can't his doctor at the [facility] clinic would cause him to come to the ED. He denies any fevers, cough, shortness of breath, nausea, vomiting, chest pain, sore throat or recent upper respiratory infection. He denies any sick contact.  Review of Systems: The patient denies anorexia, fever, weight loss,, vision loss, decreased hearing, hoarseness, chest pain, syncope, dyspnea on exertion, peripheral edema, balance deficits, hemoptysis, abdominal pain, melena, hematochezia, severe indigestion/heartburn, hematuria, incontinence, genital sores, muscle weakness, suspicious skin lesions, transient blindness, difficulty walking, depression, unusual weight change, abnormal bleeding, angioedema, and breast masses.    Past Medical History  Diagnosis Date  . Sickle cell disease   . Avascular necrosis of femur head, right   . H/O allergic rhinitis   . H/O hypokalemia   . Chronic pain syndrome   . H/O wheezing     with colds   Past Surgical History  Procedure Laterality Date  . Cholecystectomy  1995  . Portacath placement Left 07/04/2012  . Exchange transfusion  02/2008    perioperatively for shoulder surgery  . 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 He has never used smokeless tobacco. He reports that he does not drink alcohol or use illicit drugs.  No Known  Allergies  Family History  Problem Relation Age of Onset  . Sickle cell anemia Brother   . Sickle cell trait Father   . Sickle cell trait Mother   . Diabetes Mellitus II Mother   . Sickle cell anemia Paternal Uncle   . Asthma Neg Hx   . Allergic rhinitis Neg Hx     Prior to Admission medications   Medication Sig Start Date End Date Taking? Authorizing Provider  albuterol (PROVENTIL HFA;VENTOLIN HFA) 108 (90 BASE) MCG/ACT inhaler Inhale 1 puff into the lungs every 6 (six) hours as needed for wheezing or shortness of breath. wheezing 11/27/12  Yes Vassie Loll, MD  diphenhydrAMINE (BENADRYL) 25 MG tablet Take 25 mg by mouth every 8 (eight) hours as needed. For itching. 08/23/12  Yes Dow Adolph, MD  folic acid (FOLVITE) 1 MG tablet Take 1 mg by mouth every morning. 11/27/12  Yes Vassie Loll, MD  HYDROmorphone (DILAUDID) 4 MG tablet Take 1 tablet (4 mg total) by mouth every 4 (four) hours as needed for pain. 02/14/13  Yes Altha Harm, MD  hydroxyurea (HYDREA) 500 MG capsule Take 2,000 mg by mouth every morning. May take with food to minimize GI side effects. 11/27/12  Yes Vassie Loll, MD  morphine (MS CONTIN) 60 MG 12 hr tablet Take 1 tablet (60 mg total) by mouth 2 (two) times daily as needed for pain. 02/14/13  Yes Altha Harm, MD  promethazine (PHENERGAN) 25 MG tablet Take 1 tablet (25 mg total) by mouth every 6 (six) hours as needed for nausea. 12/12/12  Yes Alinda Money, MD  sennosides-docusate sodium (SENOKOT-S) 8.6-50 MG  tablet Take 1 tablet by mouth 2 (two) times daily. 02/14/13  Yes Altha Harm, MD   Physical Exam: Filed Vitals:   03/23/13 1219 03/23/13 1220 03/23/13 1357 03/23/13 1548  BP:  117/72 106/66 105/69  Pulse:  121 98 93  Temp:  98.8 F (37.1 C)  98.2 F (36.8 C)  TempSrc: Oral Oral  Oral  Resp:  18 20 18   SpO2:  98% 97% 98%    BP 105/69  Pulse 93  Temp(Src) 98.2 F (36.8 C) (Oral)  Resp 18  SpO2 98%  General Appearance:    Alert,  cooperative, no distress, appears stated age  Head:    Normocephalic, without obvious abnormality, atraumatic           Throat:   Lips, mucosa, and tongue normal; teeth and gums normal  Neck:   Supple, symmetrical, trachea midline, no adenopathy;       thyroid:  No enlargement/tenderness/nodules; no carotid   bruit or JVD  Back:     Symmetric, no curvature, ROM normal, no CVA tenderness  Lungs:     Clear to auscultation bilaterally, respirations unlabored  Chest wall:    No tenderness or deformity  Heart:    Regular rate and rhythm, S1 and S2 normal, no murmur, rub   or gallop  Abdomen:     Soft, non-tender, bowel sounds active all four quadrants,    no masses, no organomegaly        Extremities:   Extremities normal, atraumatic, no cyanosis or edema  Pulses:   2+ and symmetric all extremities  Skin:   there is a  knot on the left posterior side of his arm, which is tender to palpation soft movable   Lymph nodes:   Cervical, supraclavicular, and axillary nodes normal  Neurologic:   CNII-XII intact. Normal strength, sensation and reflexes      throughout    Labs on Admission:  Basic Metabolic Panel:  Recent Labs Lab 03/23/13 1240  NA 136  K 4.3  CL 104  CO2 24  GLUCOSE 96  BUN 11  CREATININE 0.68  CALCIUM 9.1   Liver Function Tests:  Recent Labs Lab 03/23/13 1240  AST 25  ALT 24  ALKPHOS 103  BILITOT 1.0  PROT 7.7  ALBUMIN 4.1   No results found for this basename: LIPASE, AMYLASE,  in the last 168 hours No results found for this basename: AMMONIA,  in the last 168 hours CBC:  Recent Labs Lab 03/23/13 1240  WBC 9.4  HGB 8.4*  HCT 24.7*  MCV 98.8  PLT 396   Cardiac Enzymes: No results found for this basename: CKTOTAL, CKMB, CKMBINDEX, TROPONINI,  in the last 168 hours  BNP (last 3 results) No results found for this basename: PROBNP,  in the last 8760 hours CBG: No results found for this basename: GLUCAP,  in the last 168 hours  Radiological Exams  on Admission: No results found.  EKG: Independently reviewed. none  Assessment/Plan Sickle cell pain crisis - Admit Him to med surge unit start him on half normal IV fluids,Start him on PCA pump for pain control. Check a haptoglobin LDH and ferritin. He has remained afebrile, with no leukocytosis. But his reticulocyte count is elevated compared to when he was discharged. - Continue his MS Contin at home. We'll also add IV Toradol, check a basic metabolic panel. Continue Hydrea. - Ultrasound left arm as he is a probable mass.  Sickle cell anemia - Monitor closely patient  currently asymptomatic.   Code Status: full Family Communication: none Disposition Plan: inpatient 23 days  Time spent: 65 minutes  Marinda Elk [facility] Pager [phone]  If 7PM-7AM, please contact night-coverage [email or web address] Password [email or web address] 03/23/2013, 4:31 PM

## 2013-03-23 NOTE — ED Notes (Signed)
Report called to floor

## 2013-03-23 NOTE — Progress Notes (Signed)
Utilization Review completed.  Clay Menser RN CM  

## 2013-03-23 NOTE — ED Notes (Signed)
Pt complains of pain to left arm at this time.

## 2013-03-23 NOTE — ED Provider Notes (Signed)
History    CSN: [id number] Arrival date & time 03/23/13  1156  First MD Initiated Contact with Patient 03/23/13 1213     Chief Complaint  Patient presents with  . Sickle Cell Pain Crisis   (Consider location/radiation/quality/duration/timing/severity/associated sxs/prior Treatment) HPI Comments: 27 year old male with past medical history of sickle cell disease presents emergency department complaining of left arm and low back pain beginning 2 days ago. States his pain is similar to his normal sickle cell crisis rated 10 out of 10. He is tried all his home medications without relief. Called his doctor at the sickle cell clinic who advised him to go to the emergency room. Denies chest pain, nausea, vomiting, shortness of breath, fever, chills or recent illness. No known injury or trauma. Was admitted for a similar pain episode 3 days back, states he felt better at discharge.   Patient is a 27 y.o. male presenting with sickle cell pain. The history is provided by the patient.  Sickle Cell Pain Crisis Associated symptoms: no chest pain, no fever and no shortness of breath    Past Medical History  Diagnosis Date  . Sickle cell disease   . Avascular necrosis of femur head, right   . H/O allergic rhinitis   . H/O hypokalemia   . Chronic pain syndrome   . H/O wheezing     with colds   Past Surgical History  Procedure Laterality Date  . Cholecystectomy  1995  . Portacath placement Left 07/04/2012  . Exchange transfusion  02/2008    perioperatively for shoulder surgery  . 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 Sickle cell anemia Brother   . Sickle cell trait Father   . Sickle cell trait Mother   . Diabetes Mellitus II Mother   . Sickle cell anemia Paternal Uncle   . Asthma Neg Hx   . Allergic rhinitis Neg Hx    History  Substance Use Topics  . Smoking status: Never Smoker   . Smokeless tobacco: Never Used  . Alcohol Use:  No    Review of Systems  Constitutional: Negative for fever and chills.  Respiratory: Negative for shortness of breath.   Cardiovascular: Negative for chest pain.  Musculoskeletal: Positive for back pain and arthralgias (positive for left arm pain).  All other systems reviewed and are negative.    Allergies  Review of patient's allergies indicates no known allergies.  Home Medications   Current Outpatient Rx  Name  Route  Sig  Dispense  Refill  . albuterol (PROVENTIL HFA;VENTOLIN HFA) 108 (90 BASE) MCG/ACT inhaler   Inhalation   Inhale 1 puff into the lungs every 6 (six) hours as needed for wheezing or shortness of breath. wheezing   1 Inhaler   1   . diphenhydrAMINE (BENADRYL) 25 MG tablet   Oral   Take 25 mg by mouth every 8 (eight) hours as needed. For itching.         . folic acid (FOLVITE) 1 MG tablet   Oral   Take 1 mg by mouth every morning.         Marland Kitchen HYDROmorphone (DILAUDID) 4 MG tablet   Oral   Take 1 tablet (4 mg total) by mouth every 4 (four) hours as needed for pain.   30 tablet   0   . hydroxyurea (HYDREA) 500 MG capsule   Oral   Take 2,000 mg by mouth every morning. May take with  food to minimize GI side effects.         Marland Kitchen morphine (MS CONTIN) 60 MG 12 hr tablet   Oral   Take 1 tablet (60 mg total) by mouth 2 (two) times daily as needed for pain.   14 tablet   0   . promethazine (PHENERGAN) 25 MG tablet   Oral   Take 1 tablet (25 mg total) by mouth every 6 (six) hours as needed for nausea.   30 tablet   0   . sennosides-docusate sodium (SENOKOT-S) 8.6-50 MG tablet   Oral   Take 1 tablet by mouth 2 (two) times daily.          BP 117/72  Pulse 121  Temp(Src) 98.8 F (37.1 C) (Oral)  Resp 18  SpO2 98% Physical Exam  Nursing note and vitals reviewed. Constitutional: He is oriented to person, place, and time. He appears well-developed and well-nourished. No distress.  HENT:  Head: Normocephalic and atraumatic.  Mouth/Throat:  Oropharynx is clear and moist.  Eyes: Conjunctivae are normal.  Neck: Normal range of motion. Neck supple.  Cardiovascular: Normal rate, regular rhythm, normal heart sounds and intact distal pulses.   Pulmonary/Chest: Effort normal and breath sounds normal.  Abdominal: Soft. Bowel sounds are normal. There is no tenderness.  Musculoskeletal: Normal range of motion. He exhibits no edema.  Generalized left upper arm tenderness. Full let shoulder and elbow ROM. Generalized tenderness across low back. Full lumbar ROM. No overlying erythema, edema.  Neurological: He is alert and oriented to person, place, and time. He has normal strength. No sensory deficit.  Skin: Skin is warm and dry. He is not diaphoretic.  Psychiatric: He has a normal mood and affect. His behavior is normal.    ED Course  Procedures (including critical care time) Labs Reviewed  CBC - Abnormal; Notable for the following:    RBC 2.50 (*)    Hemoglobin 8.4 (*)    HCT 24.7 (*)    RDW 17.1 (*)    All other components within normal limits  RETICULOCYTES - Abnormal; Notable for the following:    Retic Ct Pct 15.3 (*)    RBC. 2.50 (*)    Retic Count, Manual 382.5 (*)    All other components within normal limits  COMPREHENSIVE METABOLIC PANEL   No results found. 1. Sickle cell crisis     MDM  Patient with his "typical" sickle cell crisis. He is in NAD, sitting on exam bed texting on phone and chewing gum. No signs of ischemia on exam. CBC, CMP, retic. Dilaudid, benadryl, fluids, will re-assess. 1:23 PM Patient reports no improvement of pain. Will give another dose of dilaudid, benadryl and re-assess. retic % 15.3 compared to 10.3 on 6/17, retic count 382.5 compared to 296.6 on 6/17. 1:32 PM Phone call from Dr. Ashley Royalty stating this patient is not a patient of her clinic and never established care with her. He has been seen there, but never established care. 3:02 PM Patient reports mild improvement of pain, 8/10 compared  to 10/10. Will give another dose of pain medication, re-assess. If no improvement, will admit patient.  4:20 PM Patient reports no improvement of pain. He will be admitted. Admission/obs accepted by Dr. David Stall, [facility] team 8.  Trevor Mace, PA-C 03/23/13 1620

## 2013-03-23 NOTE — ED Notes (Signed)
Attempted to call report.  Patient not assigned to a nurse yet.  Will call back in 10 min.

## 2013-03-24 DIAGNOSIS — R2232 Localized swelling, mass and lump, left upper limb: Secondary | ICD-10-CM | POA: Diagnosis present

## 2013-03-24 DIAGNOSIS — F1920 Other psychoactive substance dependence, uncomplicated: Secondary | ICD-10-CM

## 2013-03-24 LAB — PROTIME-INR
INR: 1.05 (ref 0.00–1.49)
Prothrombin Time: 13.5 seconds (ref 11.6–15.2)

## 2013-03-24 LAB — BASIC METABOLIC PANEL
Calcium: 8.8 mg/dL (ref 8.4–10.5)
Chloride: 102 mEq/L (ref 96–112)
Creatinine, Ser: 0.72 mg/dL (ref 0.50–1.35)
GFR calc Af Amer: >90 mL/min (ref >90)
Sodium: 136 mEq/L (ref 135–145)

## 2013-03-24 LAB — CBC WITH DIFFERENTIAL/PLATELET
Band Neutrophils: 0 % (ref 0–10)
Basophils Absolute: 0 10*3/uL (ref 0.0–0.1)
Basophils Relative: 0 % (ref 0–1)
HCT: 21.9 % — ABNORMAL LOW (ref 39.0–52.0)
Hemoglobin: 7.6 g/dL — ABNORMAL LOW (ref 13.0–17.0)
Lymphs Abs: 5.2 10*3/uL — ABNORMAL HIGH (ref 0.7–4.0)
MCH: 34.1 pg — ABNORMAL HIGH (ref 26.0–34.0)
MCHC: 34.7 g/dL (ref 30.0–36.0)
MCV: 98.2 fL (ref 78.0–100.0)
Metamyelocytes Relative: 0 %
Myelocytes: 0 %
Promyelocytes Absolute: 0 %
RDW: 16.6 % — ABNORMAL HIGH (ref 11.5–15.5)

## 2013-03-24 NOTE — Progress Notes (Signed)
[facility] PROGRESS NOTE  Dennis Bernard ZOX:[id number] DOB: [birth year] DOA: 03/23/2013 PCP: No primary provider on file.  Brief narrative 27 year old male patient with history of Hb SS, avascular necrosis of right femoral head, cholecystectomy, opioid dependence, medication noncompliance, student at [employer], recently discharged from the hospital on 02/18/13 (11 admissions in the last 6 months) presented to the [facility] ED on 03/23/13 with complaints of left arm and low back pain typical for his sickle cell painful crisis. Hospitalist admission was requested. He also noted a painful swelling on the left upper arm. On review of previous records, note by sickle cell M.D. on 02/21/13 outlined patient not being truthful about his opioid medications  Assessment/Plan: 1. Sickle cell vaso-occlusive crisis: Patient states that his crisis precipitated by extreme hot or cold weather. States that pain is not significantly changed compared to admission. Continue hypotonic IV fluids, Dilaudid PCA and when necessary IV Toradol. Monitor closely. Of note, bilirubin is normal and LDH is only mildly elevated. Ferritin 618. Haptoglobin <25. Absolute reticulocyte count 383. 2. Sickle cell anemia: Baseline hemoglobin said to be in the 8 g range per prior records. Follow up CBC in a.m. and transfuse as needed. 3. Left upper extremity focal swelling: Indurated swelling in the left upper extremity of unclear etiology. Ultrasound does not show any abscess. Seems more chronic than acute. Monitor closely and will need outpatient followup. 4. History of opioid dependence and medication noncompliance.  Code Status: Full Family Communication: None Disposition Plan: Home when medically stable   Consultants:  None   Procedures:  None  Antibiotics:  None   HPI/Subjective: Continues to complain of 9/10 pain in lower back and left upper extremity.  Objective: Filed Vitals:   03/24/13 0740 03/24/13  0957 03/24/13 1144 03/24/13 1542  BP:  106/70    Pulse:  75    Temp:  97.9 F (36.6 C)    TempSrc:  Oral    Resp: 12 13 21 17   Height:      Weight:      SpO2: 96% 98% 100% 99%    Intake/Output Summary (Last 24 hours) at 03/24/13 1550 Last data filed at 03/24/13 1313  Gross per 24 hour  Intake    920 ml  Output      0 ml  Net    920 ml   Filed Weights   03/23/13 1726  Weight: 74.844 kg (165 lb)    Exam:   General exam: Appears to be quite comfortable even though he is complaining of significant pain.  Respiratory system: Clear. No increased work of breathing.  Cardiovascular system: S1 & S2 heard, RRR. No JVD, murmurs, gallops, clicks or pedal edema.  Gastrointestinal system: Abdomen is nondistended, soft and nontender. Normal bowel sounds heard.  Central nervous system: Alert and oriented. No focal neurological deficits.  Extremities: Symmetric 5 x 5 power. Approximately 3 cm diameter, hard subcutaneous swelling over made left lateral upper arm without any superficial redness, fluctuation or skin changes.? Tender versus generally tender left upper extremity   Data Reviewed: Basic Metabolic Panel:  Recent Labs Lab 03/23/13 1240 03/23/13 1855 03/24/13 0540  NA 136  --  136  K 4.3  --  4.0  CL 104  --  102  CO2 24  --  26  GLUCOSE 96  --  103*  BUN 11  --  11  CREATININE 0.68 0.68 0.72  CALCIUM 9.1  --  8.8   Liver Function Tests:  Recent Labs Lab  03/23/13 1240  AST 25  ALT 24  ALKPHOS 103  BILITOT 1.0  PROT 7.7  ALBUMIN 4.1   No results found for this basename: LIPASE, AMYLASE,  in the last 168 hours No results found for this basename: AMMONIA,  in the last 168 hours CBC:  Recent Labs Lab 03/23/13 1240 03/23/13 1855 03/24/13 0540  WBC 9.4 12.1* 11.0*  NEUTROABS  --   --  5.3  HGB 8.4* 8.2* 7.6*  HCT 24.7* 24.0* 21.9*  MCV 98.8 98.0 98.2  PLT 396 455* 388   Cardiac Enzymes: No results found for this basename: CKTOTAL, CKMB, CKMBINDEX,  TROPONINI,  in the last 168 hours BNP (last 3 results) No results found for this basename: PROBNP,  in the last 8760 hours CBG: No results found for this basename: GLUCAP,  in the last 168 hours  No results found for this or any previous visit (from the past 240 hour(s)).   Studies: [place] Extrem Up Left Ltd  03/23/2013   *RADIOLOGY REPORT*  Clinical Data: Palpable knot posterior left arm.  ULTRASOUND LEFT UPPER EXTREMITY LIMITED  Technique:  Ultrasound examination of the region of interest in the left upper extremity was performed.  Comparison:  None.  Findings: Evaluation of the posterior aspect of the left upper arm demonstrates no focal mass or fluid collection.  There is mild heterogeneity of the subcutaneous fat which could reflect cellulitis.  IMPRESSION: No focal abnormality is identified to correspond with the patient's palpable concern.  Clinical follow-up is recommended.  If persistent, further evaluation with CT or MRI should be considered.   Original Report Authenticated By: Carey Bullocks, M.D.     Additional labs:   Scheduled Meds: . sodium chloride   Intravenous STAT  . folic acid  1 mg Oral q morning - 10a  . heparin  5,000 Units Subcutaneous Q8H  . HYDROmorphone PCA 0.3 mg/mL   Intravenous Q4H  . hydroxyurea  2,000 mg Oral q morning - 10a  . morphine  60 mg Oral Q12H  . senna-docusate  1 tablet Oral BID   Continuous Infusions: . dextrose 5 % and 0.45% NaCl 100 mL/hr at 03/24/13 1059    Active Problems:   Sickle cell pain crisis   Sickle cell anemia    Time spent: 30 minutes.    [facility]  [facility] Pager [phone].   If 8PM-8AM, please contact night-coverage at [email or web address], password [facility] 03/24/2013, 3:50 PM  LOS: 1 day

## 2013-03-25 LAB — CBC
MCH: 32.9 pg (ref 26.0–34.0)
Platelets: 414 10*3/uL — ABNORMAL HIGH (ref 150–400)
RBC: 2.25 MIL/uL — ABNORMAL LOW (ref 4.22–5.81)
WBC: 15 10*3/uL — ABNORMAL HIGH (ref 4.0–10.5)

## 2013-03-25 LAB — HEPATIC FUNCTION PANEL
AST: 26 U/L (ref 0–37)
Bilirubin, Direct: 0.2 mg/dL (ref 0.0–0.3)
Total Protein: 6.6 g/dL (ref 6.0–8.3)

## 2013-03-25 LAB — LACTATE DEHYDROGENASE: LDH: 292 U/L — ABNORMAL HIGH (ref 94–250)

## 2013-03-25 NOTE — Progress Notes (Signed)
[facility] PROGRESS NOTE  Dennis Bernard UJW:[id number] DOB: [birth year] DOA: 03/23/2013 PCP: No primary provider on file.  Brief narrative 27 year old male patient with history of Hb SS, avascular necrosis of right femoral head, cholecystectomy, opioid dependence, medication noncompliance, student at [employer], recently discharged from the hospital on 02/18/13 (11 admissions in the last 6 months) presented to the [facility] ED on 03/23/13 with complaints of left arm and low back pain typical for his sickle cell painful crisis. Hospitalist admission was requested. He also noted a painful swelling on the left upper arm. On review of previous records, note by sickle cell M.D. on 02/21/13 outlined patient not being truthful about his opioid medications  Assessment/Plan: 1. Sickle cell vaso-occlusive crisis: Patient states that his crisis are precipitated by extreme hot or cold weather. States that pain is gradually improving and is better than yesterday. Continue hypotonic IV fluids, Dilaudid PCA (for additional 24 hours) and when necessary IV Toradol. Monitor closely. Of note, bilirubin is normal and LDH is only mildly elevated. Ferritin 618. Haptoglobin <25. Absolute reticulocyte count 383. 2. Sickle cell anemia: Baseline hemoglobin said to be in the 8 g range per prior records. Follow up CBC in a.m. and transfuse as needed if any further drop in hemoglobin. Hemoglobin 7.4 g per DL. 3. Left upper extremity focal swelling: Indurated swelling in the left upper extremity of unclear etiology. Ultrasound does not show any abscess. Seems more chronic than acute. Monitor closely and will need outpatient followup. 4. History of opioid dependence and medication noncompliance.  Code Status: Full Family Communication: None Disposition Plan: Home when medically stable   Consultants:  None   Procedures:  None  Antibiotics:  None   HPI/Subjective: States that pain is slightly better than  yesterday. Denies any other complaints.  Objective: Filed Vitals:   03/25/13 0800 03/25/13 1008 03/25/13 1156 03/25/13 1346  BP:  109/69  99/64  Pulse:  98  86  Temp:  98.4 F (36.9 C)  98 F (36.7 C)  TempSrc:  Oral  Oral  Resp: 18 22 10 18   Height:      Weight:      SpO2: 99% 90% 97% 100%    Intake/Output Summary (Last 24 hours) at 03/25/13 1406 Last data filed at 03/25/13 1254  Gross per 24 hour  Intake 3679.37 ml  Output      0 ml  Net 3679.37 ml   Filed Weights   03/23/13 1726  Weight: 74.844 kg (165 lb)    Exam:   General exam: Comfortable.  Respiratory system: Clear. No increased work of breathing.  Cardiovascular system: S1 & S2 heard, RRR. No JVD, murmurs, gallops, clicks or pedal edema.  Gastrointestinal system: Abdomen is nondistended, soft and nontender. Normal bowel sounds heard.  Central nervous system: Alert and oriented. No focal neurological deficits.  Extremities: Symmetric 5 x 5 power. Approximately 3 cm diameter, hard subcutaneous swelling over made left lateral upper arm without any superficial redness, fluctuation or skin changes.? Tender versus generally tender left upper extremity   Data Reviewed: Basic Metabolic Panel:  Recent Labs Lab 03/23/13 1240 03/23/13 1855 03/24/13 0540  NA 136  --  136  K 4.3  --  4.0  CL 104  --  102  CO2 24  --  26  GLUCOSE 96  --  103*  BUN 11  --  11  CREATININE 0.68 0.68 0.72  CALCIUM 9.1  --  8.8   Liver Function Tests:  Recent Labs  Lab 03/23/13 1240 03/25/13 0530  AST 25 26  ALT 24 17  ALKPHOS 103 89  BILITOT 1.0 1.2  PROT 7.7 6.6  ALBUMIN 4.1 3.6   No results found for this basename: LIPASE, AMYLASE,  in the last 168 hours No results found for this basename: AMMONIA,  in the last 168 hours CBC:  Recent Labs Lab 03/23/13 1240 03/23/13 1855 03/24/13 0540 03/25/13 0530  WBC 9.4 12.1* 11.0* 15.0*  NEUTROABS  --   --  5.3  --   HGB 8.4* 8.2* 7.6* 7.4*  HCT 24.7* 24.0* 21.9*  21.9*  MCV 98.8 98.0 98.2 97.3  PLT 396 455* 388 414*   Cardiac Enzymes: No results found for this basename: CKTOTAL, CKMB, CKMBINDEX, TROPONINI,  in the last 168 hours BNP (last 3 results) No results found for this basename: PROBNP,  in the last 8760 hours CBG: No results found for this basename: GLUCAP,  in the last 168 hours  No results found for this or any previous visit (from the past 240 hour(s)).   Studies: [place] Extrem Up Left Ltd  03/23/2013   *RADIOLOGY REPORT*  Clinical Data: Palpable knot posterior left arm.  ULTRASOUND LEFT UPPER EXTREMITY LIMITED  Technique:  Ultrasound examination of the region of interest in the left upper extremity was performed.  Comparison:  None.  Findings: Evaluation of the posterior aspect of the left upper arm demonstrates no focal mass or fluid collection.  There is mild heterogeneity of the subcutaneous fat which could reflect cellulitis.  IMPRESSION: No focal abnormality is identified to correspond with the patient's palpable concern.  Clinical follow-up is recommended.  If persistent, further evaluation with CT or MRI should be considered.   Original Report Authenticated By: Carey Bullocks, M.D.     Additional labs:   Scheduled Meds: . folic acid  1 mg Oral q morning - 10a  . heparin  5,000 Units Subcutaneous Q8H  . HYDROmorphone PCA 0.3 mg/mL   Intravenous Q4H  . hydroxyurea  2,000 mg Oral q morning - 10a  . morphine  60 mg Oral Q12H  . senna-docusate  1 tablet Oral BID   Continuous Infusions: . dextrose 5 % and 0.45% NaCl 100 mL/hr at 03/25/13 4098    Active Problems:   Sickle cell pain crisis   Sickle cell anemia   Narcotic habituation, continuous   Lump of skin of left upper extremity    Time spent: 20 minutes.    [facility]  [facility] Pager [phone].   If 8PM-8AM, please contact night-coverage at [email or web address], password [facility] 03/25/2013, 2:06 PM  LOS: 2 days

## 2013-03-26 LAB — CBC
Hemoglobin: 7.4 g/dL — ABNORMAL LOW (ref 13.0–17.0)
MCH: 33.5 pg (ref 26.0–34.0)
RBC: 2.21 MIL/uL — ABNORMAL LOW (ref 4.22–5.81)

## 2013-03-26 LAB — BASIC METABOLIC PANEL
CO2: 26 mEq/L (ref 19–32)
Calcium: 8.9 mg/dL (ref 8.4–10.5)
Glucose, Bld: 120 mg/dL — ABNORMAL HIGH (ref 70–99)
Potassium: 3.8 mEq/L (ref 3.5–5.1)
Sodium: 139 mEq/L (ref 135–145)

## 2013-03-26 MED ORDER — HYDROMORPHONE HCL 4 MG PO TABS
4.0000 mg | ORAL_TABLET | ORAL | Status: DC
  Administered 2013-03-26 – 2013-03-27 (×8): 4 mg via ORAL
  Filled 2013-03-26 (×8): qty 1

## 2013-03-26 MED ORDER — HYDROMORPHONE HCL PF 1 MG/ML IJ SOLN
1.0000 mg | INTRAMUSCULAR | Status: DC | PRN
  Administered 2013-03-26 – 2013-03-27 (×6): 1 mg via INTRAVENOUS
  Filled 2013-03-26 (×6): qty 1

## 2013-03-26 NOTE — Progress Notes (Signed)
[facility] PROGRESS NOTE  Dennis Bernard ZOX:[id number] DOB: [birth year] DOA: 03/23/2013 PCP: No primary provider on file.  Brief narrative 27 year old male patient with history of Hb SS, avascular necrosis of right femoral head, cholecystectomy, opioid dependence, medication noncompliance, student at [employer], recently discharged from the hospital on 02/18/13 (11 admissions in the last 6 months) presented to the [facility] ED on 03/23/13 with complaints of left arm and low back pain typical for his sickle cell painful crisis. Hospitalist admission was requested. He also noted a painful swelling on the left upper arm. On review of previous records, note by sickle cell M.D. on 02/21/13 outlined patient not being truthful about his opioid medications  Assessment/Plan: 1. Sickle cell vaso-occlusive crisis: Patient states that his crisis are precipitated by extreme hot or cold weather. States that pain is gradually improving and rates it as 6-7/10. Continue hypotonic IV fluids and when necessary IV Toradol. Monitor closely. Of note, bilirubin is normal and LDH is only mildly elevated. Ferritin 618. Haptoglobin <25. Absolute reticulocyte count 383. DC Dilaudid PCA. Start scheduled oral Dilaudid for 24 hours which can then be transitioned to when necessary. When necessary IV Dilaudid for breakthrough pain. 2. Sickle cell anemia: Baseline hemoglobin said to be in the 8 g range per prior records. Follow up CBC in a.m. and transfuse as needed if any further drop in hemoglobin. Hemoglobin stable. 3. Left upper extremity focal swelling/lump: Indurated swelling in the left upper extremity of unclear etiology. Ultrasound does not show any abscess. Seems more chronic than acute. Monitor closely and will need outpatient followup. 4. History of opioid dependence and medication noncompliance.  Code Status: Full Family Communication: None Disposition Plan: Home when medically stable-possibly  7/14   Consultants:  None   Procedures:  None  Antibiotics:  None   HPI/Subjective: States pain continuing to improve and rates it as 6-7/10. More mobile.  Objective: Filed Vitals:   03/26/13 0559 03/26/13 0644 03/26/13 0935 03/26/13 1025  BP:   116/70   Pulse:   84   Temp:   98.4 F (36.9 C)   TempSrc:   Oral   Resp: 18 18 14 16   Height:      Weight:      SpO2: 98% 99% 97% 98%    Intake/Output Summary (Last 24 hours) at 03/26/13 1153 Last data filed at 03/26/13 0900  Gross per 24 hour  Intake   1850 ml  Output   1100 ml  Net    750 ml   Filed Weights   03/23/13 1726  Weight: 74.844 kg (165 lb)    Exam:   General exam: Comfortable. Standing up next to bed. Looks better than he did 2 days ago.  Respiratory system: Clear. No increased work of breathing.  Cardiovascular system: S1 & S2 heard, RRR. No JVD, murmurs, gallops, clicks or pedal edema.  Gastrointestinal system: Abdomen is nondistended, soft and nontender. Normal bowel sounds heard.  Central nervous system: Alert and oriented. No focal neurological deficits.  Extremities: Symmetric 5 x 5 power. Approximately 3 cm diameter, hard subcutaneous swelling over made left lateral upper arm without any superficial redness, fluctuation or skin changes. Nontender.   Data Reviewed: Basic Metabolic Panel:  Recent Labs Lab 03/23/13 1240 03/23/13 1855 03/24/13 0540 03/26/13 0605  NA 136  --  136 139  K 4.3  --  4.0 3.8  CL 104  --  102 107  CO2 24  --  26 26  GLUCOSE 96  --  103* 120*  BUN 11  --  11 7  CREATININE 0.68 0.68 0.72 0.70  CALCIUM 9.1  --  8.8 8.9   Liver Function Tests:  Recent Labs Lab 03/23/13 1240 03/25/13 0530  AST 25 26  ALT 24 17  ALKPHOS 103 89  BILITOT 1.0 1.2  PROT 7.7 6.6  ALBUMIN 4.1 3.6   No results found for this basename: LIPASE, AMYLASE,  in the last 168 hours No results found for this basename: AMMONIA,  in the last 168 hours CBC:  Recent Labs Lab  03/23/13 1240 03/23/13 1855 03/24/13 0540 03/25/13 0530 03/26/13 0605  WBC 9.4 12.1* 11.0* 15.0* 8.0  NEUTROABS  --   --  5.3  --   --   HGB 8.4* 8.2* 7.6* 7.4* 7.4*  HCT 24.7* 24.0* 21.9* 21.9* 21.4*  MCV 98.8 98.0 98.2 97.3 96.8  PLT 396 455* 388 414* 344   Cardiac Enzymes: No results found for this basename: CKTOTAL, CKMB, CKMBINDEX, TROPONINI,  in the last 168 hours BNP (last 3 results) No results found for this basename: PROBNP,  in the last 8760 hours CBG: No results found for this basename: GLUCAP,  in the last 168 hours  No results found for this or any previous visit (from the past 240 hour(s)).   Studies: No results found.   Additional labs:   Scheduled Meds: . folic acid  1 mg Oral q morning - 10a  . heparin  5,000 Units Subcutaneous Q8H  . HYDROmorphone  4 mg Oral Q4H  . hydroxyurea  2,000 mg Oral q morning - 10a  . morphine  60 mg Oral Q12H  . senna-docusate  1 tablet Oral BID   Continuous Infusions: . dextrose 5 % and 0.45% NaCl 100 mL/hr at 03/26/13 1025    Active Problems:   Sickle cell pain crisis   Sickle cell anemia   Narcotic habituation, continuous   Lump of skin of left upper extremity    Time spent: 20 minutes.    [facility]  [facility] Pager [phone].   If 8PM-8AM, please contact night-coverage at [email or web address], password [facility] 03/26/2013, 11:53 AM  LOS: 3 days

## 2013-03-27 LAB — CBC
Hemoglobin: 8.5 g/dL — ABNORMAL LOW (ref 13.0–17.0)
MCH: 32.9 pg (ref 26.0–34.0)
MCV: 96.5 fL (ref 78.0–100.0)
RBC: 2.58 MIL/uL — ABNORMAL LOW (ref 4.22–5.81)

## 2013-03-27 MED ORDER — HYDROMORPHONE HCL 4 MG PO TABS: 4.0000 mg | ORAL_TABLET | ORAL | Status: DC | PRN

## 2013-03-27 MED ORDER — HEPARIN SOD (PORK) LOCK FLUSH 100 UNIT/ML IV SOLN
INTRAVENOUS | Status: AC
  Administered 2013-03-27: 500 [IU]
  Filled 2013-03-27: qty 5

## 2013-03-27 MED ORDER — MORPHINE SULFATE ER 60 MG PO TBCR: 60.0000 mg | EXTENDED_RELEASE_TABLET | Freq: Two times a day (BID) | ORAL | Status: DC

## 2013-03-27 NOTE — Discharge Summary (Signed)
Physician Discharge Summary  Dennis Bernard ZHY:[id number] DOB: 1985/11/09 DOA: 03/23/2013  PCP: No primary provider on file.  Admit date: 03/23/2013 Discharge date: 03/27/2013  Time spent: Less than 30 minutes  Recommendations for Outpatient Follow-up:  1. Dr. [name] with [facility] on 04/03/13 at 12:30 PM. To be seen with repeat labs (CBC) 2. Dr. Zettie Cooley, [facility] on 04/11/13 at 9:30 AM. 3. Outpatient follow up of left upper arm lump and evaluation as deemed necessary.  Discharge Diagnoses:  Active Problems:   Sickle cell pain crisis   Sickle cell anemia   Narcotic habituation, continuous   Lump of skin of left upper extremity   Discharge Condition: Improved & Stable  Diet recommendation: Regular diet.  Filed Weights   03/23/13 1726 03/27/13 0500  Weight: 74.844 kg (165 lb) 79.742 kg (175 lb 12.8 oz)    History of present illness:  27 year old male patient with history of Hb SS, avascular necrosis of right femoral head, cholecystectomy, opioid dependence, medication noncompliance, student at [employer], recently discharged from the hospital on 02/18/13 (11 admissions in the last 6 months) presented to the [facility] ED on 03/23/13 with complaints of left arm and low back pain typical for his sickle cell painful crisis. Hospitalist admission was requested. He also noted a painful swelling on the left upper arm. On review of previous records, note by sickle cell M.D. on 02/21/13 outlined patient not being truthful about his opioid medications  Hospital Course:  1. Sickle cell vaso-occlusive crisis: Patient states that his crisis are usually precipitated by extreme hot or cold weather. He was treated with hypotonic IV fluids, when necessary IV Toradol and Dilaudid PCA. With these measures, gradually over the next 2-3 days patient's pain improved and PCA was discontinued. He was placed on scheduled oral Dilaudid for the last 24 hours. His  pain has continued to improve and states that at this time he feels as though his pain can be managed by oral medications at home. Patient has been strongly counseled regarding not misusing his pain medications and dangerous effects of same. He verbalizes understanding. He has setup an appointment to see a hematologist at [facility]. The case manager has also set up a appointment in [place] in a week's time. 2. Sickle cell anemia: Baseline hemoglobin said to be 8 g range per prior records. Stable. No transfusions done during this admission. 3. Left upper extremity focal swelling/lump  : Indurated swelling in the left upper extremity of unclear etiology. Ultrasound does not show any abscess. No other acute findings. Will need close outpatient followup and further workup as deemed necessary. 4. History of opioid dependence and medication compliance: Patient again counseled regarding compliance with M.D. followups, medications and against misuse of pain medications. He has also been counseled not to drink alcohol while on these medications or drive. He verbalizes understanding.   Procedures:  None   Consultations:  None  Discharge Exam:  Complaints: He states that his pain is much improved-5/10 and thinks that current pain can be managed at home by oral medications. Denies any other complaints.   Filed Vitals:   03/27/13 0200 03/27/13 0500 03/27/13 1016 03/27/13 1349  BP: 111/59 115/62 128/66 129/77  Pulse: 81 76 113 103  Temp: 98.2 F (36.8 C) 98.2 F (36.8 C) 98.4 F (36.9 C) 99.7 F (37.6 C)  TempSrc: Oral Oral    Resp: 18 18 18 18   Height:      Weight:  79.742 kg (175 lb 12.8 oz)    SpO2: 100% 100% 100% 96%     General exam: Comfortable.   Respiratory system: Clear. No increased work of breathing.   Cardiovascular system: S1 & S2 heard, RRR. No JVD, murmurs, gallops, clicks or pedal edema.   Gastrointestinal system: Abdomen is nondistended, soft and nontender. Normal  bowel sounds heard.   Central nervous system: Alert and oriented. No focal neurological deficits.   Extremities: Symmetric 5 x 5 power. Approximately 3 cm diameter, hard subcutaneous swelling over made left lateral upper arm without any superficial redness, fluctuation or skin changes. Nontender.   Discharge Instructions      Discharge Orders   Future Appointments Provider Department Dept Phone   04/03/2013 12:30 PM [facility] Covering Provider [facility] Joan Flores [phone]   Future Orders Complete By Expires     Call MD for:  difficulty breathing, headache or visual disturbances  As directed     Call MD for:  extreme fatigue  As directed     Call MD for:  persistant dizziness or light-headedness  As directed     Call MD for:  persistant nausea and vomiting  As directed     Call MD for:  redness, tenderness, or signs of infection (pain, swelling, redness, odor or green/yellow discharge around incision site)  As directed     Call MD for:  severe uncontrolled pain  As directed     Call MD for:  temperature >100.4  As directed     Diet general  As directed     Increase activity slowly  As directed         Medication List         albuterol 108 (90 BASE) MCG/ACT inhaler  Commonly known as:  PROVENTIL HFA;VENTOLIN HFA  Inhale 1 puff into the lungs every 6 (six) hours as needed for wheezing or shortness of breath. wheezing     diphenhydrAMINE 25 MG tablet  Commonly known as:  BENADRYL  Take 25 mg by mouth every 8 (eight) hours as needed. For itching.     folic acid 1 MG tablet  Commonly known as:  FOLVITE  Take 1 mg by mouth every morning.     HYDROmorphone 4 MG tablet  Commonly known as:  DILAUDID  Take 1 tablet (4 mg total) by mouth every 4 (four) hours as needed for pain.     hydroxyurea 500 MG capsule  Commonly known as:  HYDREA  Take 2,000 mg by mouth every morning. May take with food to minimize GI side effects.     morphine 60 MG 12 hr tablet   Commonly known as:  MS CONTIN  Take 1 tablet (60 mg total) by mouth 2 (two) times daily.     promethazine 25 MG tablet  Commonly known as:  PHENERGAN  Take 1 tablet (25 mg total) by mouth every 6 (six) hours as needed for nausea.     sennosides-docusate sodium 8.6-50 MG tablet  Commonly known as:  SENOKOT-S  Take 1 tablet by mouth 2 (two) times daily.       Follow-up Information   Follow up with [facility] On 04/11/2013. (at 9:30AM)    Contact information:   [address] [id number] [facility] [address] [place] [address]        The results of significant diagnostics from this hospitalization (including imaging, microbiology, ancillary and laboratory) are listed below for reference.    Significant  Diagnostic Studies: [place] Extrem Up Left Ltd  03/23/2013   *RADIOLOGY REPORT*  Clinical Data: Palpable knot posterior left arm.  ULTRASOUND LEFT UPPER EXTREMITY LIMITED  Technique:  Ultrasound examination of the region of interest in the left upper extremity was performed.  Comparison:  None.  Findings: Evaluation of the posterior aspect of the left upper arm demonstrates no focal mass or fluid collection.  There is mild heterogeneity of the subcutaneous fat which could reflect cellulitis.  IMPRESSION: No focal abnormality is identified to correspond with the patient's palpable concern.  Clinical follow-up is recommended.  If persistent, further evaluation with CT or MRI should be considered.   Original Report Authenticated By: Carey Bullocks, M.D.    Microbiology: No results found for this or any previous visit (from the past 240 hour(s)).   Labs: Basic Metabolic Panel:  Recent Labs Lab 03/23/13 1240 03/23/13 1855 03/24/13 0540 03/26/13 0605  NA 136  --  136 139  K 4.3  --  4.0 3.8  CL 104  --  102 107  CO2 24  --  26 26  GLUCOSE 96  --  103* 120*  BUN 11  --  11 7  CREATININE 0.68 0.68 0.72 0.70  CALCIUM 9.1  --  8.8 8.9   Liver Function  Tests:  Recent Labs Lab 03/23/13 1240 03/25/13 0530  AST 25 26  ALT 24 17  ALKPHOS 103 89  BILITOT 1.0 1.2  PROT 7.7 6.6  ALBUMIN 4.1 3.6   No results found for this basename: LIPASE, AMYLASE,  in the last 168 hours No results found for this basename: AMMONIA,  in the last 168 hours CBC:  Recent Labs Lab 03/23/13 1855 03/24/13 0540 03/25/13 0530 03/26/13 0605 03/27/13 0520  WBC 12.1* 11.0* 15.0* 8.0 13.8*  NEUTROABS  --  5.3  --   --   --   HGB 8.2* 7.6* 7.4* 7.4* 8.5*  HCT 24.0* 21.9* 21.9* 21.4* 24.9*  MCV 98.0 98.2 97.3 96.8 96.5  PLT 455* 388 414* 344 371   Cardiac Enzymes: No results found for this basename: CKTOTAL, CKMB, CKMBINDEX, TROPONINI,  in the last 168 hours BNP: BNP (last 3 results) No results found for this basename: PROBNP,  in the last 8760 hours CBG: No results found for this basename: GLUCAP,  in the last 168 hours  Additional labs:   LDH: 262 >292 >293  Ferritin: 618  Haptoglobin: <25  Reticulocytes on admission: 382.5   Signed:  Antonietta Lansdowne  [facility] 03/27/2013, 4:12 PM

## 2013-03-27 NOTE — Care Management Note (Signed)
Cm spoke with patient at bedside with RN present concerning establishment of PCP. Patient states has an appt with a Hematologist Dr. Ellender Hose  On 7/29 @ 0930 at [facility]. Pt still wants a primary care physician. Pt agreeable to follow up with [facility]. Appt scheduled on July 21,2014 @ 12:30pm with Dr.Devine. Pt states relocated to [place] to attend school. Has family support. No tx barriers.    Salvador Coupe,RN,BSN [phone]

## 2013-03-27 NOTE — ED Provider Notes (Signed)
Medical screening examination/treatment/procedure(s) were conducted as a shared visit with non-physician practitioner(s) and myself.  I personally evaluated the patient during the encounter  Patient seen and evaluated for typical sickle cell crisis symptoms. No signs of acute chest. Patient has not had resolution of pain with multiple doses of analgesia in the ER, will require admission for further management.  Gilda Crease, MD 03/27/13 [phone]

## 2013-03-27 NOTE — Progress Notes (Signed)
Pt was stable at time of discharge. Reviewed discharge packet and gave pt his prescriptions. Heparin and saline flushed port and then deaccessed it.

## 2013-04-03 ENCOUNTER — Inpatient Hospital Stay: Payer: Medicare Other

## 2013-04-05 ENCOUNTER — Telehealth (HOSPITAL_COMMUNITY): Payer: Self-pay

## 2013-04-05 NOTE — Telephone Encounter (Signed)
This CM received a call from [place] CM with community [place] [facility], questioning if this is our patient. Dennis Bernard has presented to MD in [place] to establish care. Per Dennis Bernard patient plans to attend [employer] in the fall. This CM advised Dennis Bernard that patient was last seen in our office 02/14/13 and a letter was mailed to him stating the did not establish care with Dr. Ashley Royalty due to non adherence to narcotic contract.

## 2013-05-14 ENCOUNTER — Inpatient Hospital Stay (HOSPITAL_COMMUNITY): Payer: Medicare Other

## 2013-05-14 ENCOUNTER — Encounter (HOSPITAL_COMMUNITY): Payer: Self-pay | Admitting: Emergency Medicine

## 2013-05-14 ENCOUNTER — Inpatient Hospital Stay (HOSPITAL_COMMUNITY)
Admission: EM | Admit: 2013-05-14 | Discharge: 2013-05-19 | DRG: 812 | Disposition: A | Discharge status: Home / Self Care | Payer: Medicare Other | Attending: Internal Medicine | Admitting: Internal Medicine

## 2013-05-14 DIAGNOSIS — M542 Cervicalgia: Secondary | ICD-10-CM

## 2013-05-14 DIAGNOSIS — M549 Dorsalgia, unspecified: Secondary | ICD-10-CM | POA: Diagnosis present

## 2013-05-14 DIAGNOSIS — D571 Sickle-cell disease without crisis: Secondary | ICD-10-CM

## 2013-05-14 DIAGNOSIS — G8929 Other chronic pain: Secondary | ICD-10-CM

## 2013-05-14 DIAGNOSIS — Z9114 Patient's other noncompliance with medication regimen: Secondary | ICD-10-CM

## 2013-05-14 DIAGNOSIS — R112 Nausea with vomiting, unspecified: Secondary | ICD-10-CM | POA: Diagnosis present

## 2013-05-14 DIAGNOSIS — J45909 Unspecified asthma, uncomplicated: Secondary | ICD-10-CM | POA: Diagnosis present

## 2013-05-14 DIAGNOSIS — Z87898 Personal history of other specified conditions: Secondary | ICD-10-CM

## 2013-05-14 DIAGNOSIS — D5700 Hb-SS disease with crisis, unspecified: Principal | ICD-10-CM | POA: Diagnosis present

## 2013-05-14 DIAGNOSIS — G894 Chronic pain syndrome: Secondary | ICD-10-CM

## 2013-05-14 DIAGNOSIS — R5381 Other malaise: Secondary | ICD-10-CM

## 2013-05-14 DIAGNOSIS — F1120 Opioid dependence, uncomplicated: Secondary | ICD-10-CM

## 2013-05-14 LAB — BASIC METABOLIC PANEL
Calcium: 9.6 mg/dL (ref 8.4–10.5)
GFR calc non Af Amer: 89 mL/min — ABNORMAL LOW (ref >90)
Glucose, Bld: 122 mg/dL — ABNORMAL HIGH (ref 70–99)
Sodium: 137 mEq/L (ref 135–145)

## 2013-05-14 LAB — MAGNESIUM: Magnesium: 2.2 mg/dL (ref 1.5–2.5)

## 2013-05-14 LAB — HEPATIC FUNCTION PANEL
ALT: 6 U/L (ref 0–53)
AST: 13 U/L (ref 0–37)
Bilirubin, Direct: 0.3 mg/dL (ref 0.0–0.3)

## 2013-05-14 LAB — CBC WITH DIFFERENTIAL/PLATELET
Basophils Absolute: 0 10*3/uL (ref 0.0–0.1)
Eosinophils Relative: 1 % (ref 0–5)
Lymphocytes Relative: 8 % — ABNORMAL LOW (ref 12–46)
MCV: 100.5 fL — ABNORMAL HIGH (ref 78.0–100.0)
Neutro Abs: 9.3 10*3/uL — ABNORMAL HIGH (ref 1.7–7.7)
Platelets: 400 10*3/uL (ref 150–400)
RDW: 17.2 % — ABNORMAL HIGH (ref 11.5–15.5)
WBC: 10.7 10*3/uL — ABNORMAL HIGH (ref 4.0–10.5)

## 2013-05-14 LAB — RETICULOCYTES: Retic Ct Pct: 8.8 % — ABNORMAL HIGH (ref 0.4–3.1)

## 2013-05-14 MED ORDER — FOLIC ACID 1 MG PO TABS
1.0000 mg | ORAL_TABLET | Freq: Every morning | ORAL | Status: DC
  Administered 2013-05-15 – 2013-05-19 (×5): 1 mg via ORAL
  Filled 2013-05-14 (×5): qty 1

## 2013-05-14 MED ORDER — KETOROLAC TROMETHAMINE 30 MG/ML IJ SOLN
30.0000 mg | Freq: Once | INTRAMUSCULAR | Status: AC
  Administered 2013-05-14: 30 mg via INTRAVENOUS
  Filled 2013-05-14: qty 1

## 2013-05-14 MED ORDER — FUROSEMIDE 10 MG/ML IJ SOLN
INTRAMUSCULAR | Status: AC
  Administered 2013-05-14: 20 mg via INTRAVENOUS
  Filled 2013-05-14: qty 4

## 2013-05-14 MED ORDER — PROMETHAZINE HCL 25 MG/ML IJ SOLN
25.0000 mg | INTRAMUSCULAR | Status: DC | PRN
  Administered 2013-05-14: 25 mg via INTRAVENOUS
  Filled 2013-05-14: qty 1

## 2013-05-14 MED ORDER — SODIUM CHLORIDE 0.45 % IV SOLN
INTRAVENOUS | Status: DC
  Administered 2013-05-15 (×2): via INTRAVENOUS
  Administered 2013-05-16: 75 mL/h via INTRAVENOUS
  Administered 2013-05-17 – 2013-05-18 (×2): via INTRAVENOUS

## 2013-05-14 MED ORDER — SODIUM CHLORIDE 0.9 % IV SOLN
INTRAVENOUS | Status: AC
  Administered 2013-05-15 (×2): via INTRAVENOUS

## 2013-05-14 MED ORDER — SODIUM CHLORIDE 0.9 % IJ SOLN
10.0000 mL | INTRAMUSCULAR | Status: DC | PRN
  Administered 2013-05-14 – 2013-05-19 (×4): 10 mL

## 2013-05-14 MED ORDER — HYDROMORPHONE HCL PF 1 MG/ML IJ SOLN
1.0000 mg | INTRAMUSCULAR | Status: DC | PRN
  Administered 2013-05-14 (×3): 1 mg via INTRAVENOUS
  Filled 2013-05-14 (×3): qty 1

## 2013-05-14 MED ORDER — FUROSEMIDE 10 MG/ML IJ SOLN
20.0000 mg | Freq: Once | INTRAMUSCULAR | Status: AC
  Administered 2013-05-14: 20 mg via INTRAVENOUS

## 2013-05-14 MED ORDER — ACETAMINOPHEN 325 MG PO TABS: 650.0000 mg | ORAL_TABLET | ORAL | Status: DC | PRN

## 2013-05-14 MED ORDER — MORPHINE SULFATE ER 30 MG PO TBCR
60.0000 mg | EXTENDED_RELEASE_TABLET | Freq: Two times a day (BID) | ORAL | Status: DC
  Administered 2013-05-14 – 2013-05-19 (×10): 60 mg via ORAL
  Filled 2013-05-14 (×10): qty 2

## 2013-05-14 MED ORDER — HYDROMORPHONE HCL PF 1 MG/ML IJ SOLN
1.0000 mg | INTRAMUSCULAR | Status: DC | PRN
  Administered 2013-05-14 – 2013-05-15 (×5): 1 mg via INTRAVENOUS
  Filled 2013-05-14 (×5): qty 1

## 2013-05-14 MED ORDER — HYDROXYUREA 500 MG PO CAPS
2000.0000 mg | ORAL_CAPSULE | Freq: Every morning | ORAL | Status: DC
  Administered 2013-05-15: 2000 mg via ORAL
  Filled 2013-05-14 (×2): qty 4

## 2013-05-14 MED ORDER — PANTOPRAZOLE SODIUM 40 MG IV SOLR
40.0000 mg | Freq: Every day | INTRAVENOUS | Status: DC
  Administered 2013-05-14: 40 mg via INTRAVENOUS
  Filled 2013-05-14 (×6): qty 40

## 2013-05-14 MED ORDER — DIPHENHYDRAMINE HCL 25 MG PO CAPS
25.0000 mg | ORAL_CAPSULE | ORAL | Status: DC | PRN
  Administered 2013-05-18: 50 mg via ORAL
  Filled 2013-05-14: qty 2

## 2013-05-14 MED ORDER — KETOROLAC TROMETHAMINE 30 MG/ML IJ SOLN
30.0000 mg | Freq: Four times a day (QID) | INTRAMUSCULAR | Status: DC
  Administered 2013-05-14 – 2013-05-18 (×13): 30 mg via INTRAVENOUS
  Filled 2013-05-14 (×19): qty 1

## 2013-05-14 MED ORDER — DIPHENHYDRAMINE HCL 50 MG/ML IJ SOLN
12.5000 mg | INTRAMUSCULAR | Status: DC | PRN
  Administered 2013-05-14 – 2013-05-17 (×14): 25 mg via INTRAVENOUS
  Administered 2013-05-18: 05:00:00 12.5 mg via INTRAVENOUS
  Filled 2013-05-14 (×15): qty 1

## 2013-05-14 MED ORDER — ACETAMINOPHEN 650 MG RE SUPP: 650.0000 mg | RECTAL | Status: DC | PRN

## 2013-05-14 MED ORDER — ONDANSETRON HCL 4 MG/2ML IJ SOLN
4.0000 mg | INTRAMUSCULAR | Status: DC | PRN
  Administered 2013-05-15 – 2013-05-18 (×12): 4 mg via INTRAVENOUS
  Filled 2013-05-14 (×13): qty 2

## 2013-05-14 MED ORDER — GABAPENTIN 100 MG PO CAPS
100.0000 mg | ORAL_CAPSULE | Freq: Two times a day (BID) | ORAL | Status: DC
  Administered 2013-05-14 – 2013-05-19 (×10): 100 mg via ORAL
  Filled 2013-05-14 (×11): qty 1

## 2013-05-14 MED ORDER — ONDANSETRON HCL 4 MG PO TABS: 4.0000 mg | ORAL_TABLET | ORAL | Status: DC | PRN

## 2013-05-14 MED ORDER — ALBUTEROL SULFATE HFA 108 (90 BASE) MCG/ACT IN AERS
1.0000 | INHALATION_SPRAY | Freq: Four times a day (QID) | RESPIRATORY_TRACT | Status: DC | PRN
  Filled 2013-05-14: qty 6.7

## 2013-05-14 MED ORDER — DIPHENHYDRAMINE HCL 50 MG/ML IJ SOLN
25.0000 mg | Freq: Once | INTRAMUSCULAR | Status: AC
  Administered 2013-05-14: 25 mg via INTRAVENOUS
  Filled 2013-05-14: qty 1

## 2013-05-14 MED ORDER — PANTOPRAZOLE SODIUM 40 MG PO TBEC
40.0000 mg | DELAYED_RELEASE_TABLET | Freq: Every day | ORAL | Status: DC
Start: 2013-05-14 — End: 2013-05-19
  Administered 2013-05-15 – 2013-05-19 (×5): 40 mg via ORAL
  Filled 2013-05-14 (×5): qty 1

## 2013-05-14 NOTE — ED Notes (Signed)
Pt presents stating he is having a sickle cell pain crisis. Pt reports the pain is in his lower back and neck and is 10/10. Pt also nausea and emesis. Pt denies taking any medication for pain today prior to arrival. Pt reports his last crisis was three weeks ago.

## 2013-05-14 NOTE — Progress Notes (Signed)
Received call from lab of a critical Hgb value of 6.5 relayed message to Tuesday Jackson, [place].

## 2013-05-14 NOTE — ED Notes (Signed)
Patient with a right chest central line, single lumen. States that he has had it for about 1 month. Positive blood return noted.

## 2013-05-14 NOTE — H&P (Signed)
[facility] History and Physical  Dennis Bernard ZOX:[id number] DOB: Jan 29, 1986 DOA: 05/14/2013  Referring physician: Dr. Radford Pax PCP: No primary provider on file.   Chief Complaint: back pain, neck pain, nausea and vomiting.  HPI: Dennis Bernard is a 27 y.o. male with pmh significant for sickle cell disease, narcotic missed use behavior, allergic rhinitis and seasonal asthma; came to ED complaining of back pain and neck. Patient reports pain is similar to previous crisis except for neck discomfort; he denies fever, chills, abd pain, dysuria, CP, SOB or palpitations. Also denies HA's blurred vision and photophobia. Patient endorses son nausea and vomiting; making him unable to use home PO medications. [facility] called to admit patient for further evaluation and treatment.  Review of Systems:  Negative except as otherwise mentioned at HPI.  Past Medical History  Diagnosis Date  . Sickle cell disease   . Avascular necrosis of femur head, right   . H/O allergic rhinitis   . H/O hypokalemia   . Chronic pain syndrome   . H/O wheezing     with colds   Past Surgical History  Procedure Laterality Date  . Cholecystectomy  1995  . Portacath placement Left 07/04/2012  . Exchange transfusion  02/2008    perioperatively for shoulder surgery  . 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 He has never used smokeless tobacco. He reports that he does not drink alcohol or use illicit drugs.  Family History  Problem Relation Age of Onset  . Sickle cell anemia Brother   . Sickle cell trait Father   . Sickle cell trait Mother   . Diabetes Mellitus II Mother   . Sickle cell anemia Paternal Uncle   . Asthma Neg Hx   . Allergic rhinitis Neg Hx     Prior to Admission medications   Medication Sig Start Date End Date Taking? Authorizing Provider  albuterol (PROVENTIL HFA;VENTOLIN HFA) 108 (90 BASE) MCG/ACT inhaler Inhale 1 puff into the lungs  every 6 (six) hours as needed for wheezing or shortness of breath. wheezing 11/27/12  Yes Vassie Loll, MD  diphenhydrAMINE (BENADRYL) 25 MG tablet Take 25 mg by mouth every 8 (eight) hours as needed for itching.  08/23/12  Yes Dow Adolph, MD  folic acid (FOLVITE) 1 MG tablet Take 1 mg by mouth every morning. 11/27/12  Yes Vassie Loll, MD  HYDROmorphone (DILAUDID) 4 MG tablet Take 1 tablet (4 mg total) by mouth every 4 (four) hours as needed for pain. 03/27/13  Yes Elease Etienne, MD  hydroxyurea (HYDREA) 500 MG capsule Take 2,000 mg by mouth every morning. May take with food to minimize GI side effects. 11/27/12  Yes Vassie Loll, MD  morphine (MS CONTIN) 60 MG 12 hr tablet Take 1 tablet (60 mg total) by mouth 2 (two) times daily. 03/27/13  Yes Elease Etienne, MD  promethazine (PHENERGAN) 25 MG tablet Take 1 tablet (25 mg total) by mouth every 6 (six) hours as needed for nausea. 12/12/12  Yes Alinda Money, MD  sennosides-docusate sodium (SENOKOT-S) 8.6-50 MG tablet Take 1 tablet by mouth 2 (two) times daily. 02/14/13  Yes Altha Harm, MD   Physical Exam: Filed Vitals:   05/14/13 1845  BP: 110/67  Pulse: 90  Temp: 98.9 F (37.2 C)  Resp: 20     General:  AAOX3, comfortable appearing, afebrile; NAD  Eyes: PERRL, no icterus, no nystagmus, EOMI  ENT: no erythema, no thrush, no exudates;  no drainage from ears or nostrils  Neck: supple, no JVD  Cardiovascular: S1 and S2, tachycardic, no rubs or gallops  Respiratory:CTA bilaterally  Abdomen: soft, NT, ND, positive BS  Skin: no rash or petechiae  Musculoskeletal:no joint swelling or erythema  Psychiatric: no SI or hallucinations  Neurologic: AAOX3, CN intact, no focal motor or sensory deficit.  Labs on Admission:  Basic Metabolic Panel:  Recent Labs Lab 05/14/13 1605  NA 137  K 4.1  CL 103  CO2 23  GLUCOSE 122*  BUN 11  CREATININE 1.11  CALCIUM 9.6   CBC:  Recent Labs Lab 05/14/13 1605  WBC 10.7*   NEUTROABS 9.3*  HGB 6.5*  HCT 19.1*  MCV 100.5*  PLT 400   Assessment/Plan 1-Sickle cell pain crisis:patient with sickle pain crisis and probably some aplastic component given low Hgb -will admit to telemetry -will start prn pain meds and follow sickle cell admisison protocol -will transfuse 2 units of PRBC's -will check LDH and bilirubin  -IVF's resuscitation and supportive care -patient had hx of narcotic abuse behavior and will be cautious about over using them.  2-Sickle cell anemia:will continue folic acid and hydroxyurea; will transfuse 2 units of PRBC's  3-Chronic pain:continue home pain meds regimen and be careful with extra narcotics.  4-Other malaise and fatigue:most likely due to anemia. Will provide supportive care and transfuse 2 units of PRBC's.  5-Neck pain: could be part of his pain crisis; but to be thorough will check some cervical x-rays.  6-allergic rhinitis and seasonal asthma: continue flonase and PRN albuterol  DVT: scd's    Code Status: Full Family Communication: no family at bedside Disposition Plan:inpatient, LOS > 2 midnights, telemetry  Time spent:50 minutes  Kyasia Steuck [facility] Pager [phone]  If 7PM-7AM, please contact night-coverage [email or web address] Password [facility] 05/14/2013, 6:58 PM

## 2013-05-14 NOTE — ED Notes (Signed)
Patient complains of pain in back and neck, also nauseous

## 2013-05-14 NOTE — ED Provider Notes (Signed)
CSN: [id number]     Arrival date & time 05/14/13  1545 History   First MD Initiated Contact with Patient 05/14/13 1613     Chief Complaint  Patient presents with  . Sickle Cell Pain Crisis    HPI Pt presents stating he is having a sickle cell pain crisis. Pt reports the pain is in his lower back and neck and is 10/10. Pt also nausea and emesis. Pt denies taking any medication for pain today prior to arrival. Pt reports his last crisis was three weeks ago.  Patient has had no melena or hematochezia.  Patient has had more fatigue than normal but denies shortness of breath.         Past Medical History  Diagnosis Date  . Sickle cell disease   . Avascular necrosis of femur head, right   . H/O allergic rhinitis   . H/O hypokalemia   . Chronic pain syndrome   . H/O wheezing     with colds   Past Surgical History  Procedure Laterality Date  . Cholecystectomy  1995  . Portacath placement Left 07/04/2012  . Exchange transfusion  02/2008    perioperatively for shoulder surgery  . 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 Sickle cell anemia Brother   . Sickle cell trait Father   . Sickle cell trait Mother   . Diabetes Mellitus II Mother   . Sickle cell anemia Paternal Uncle   . Asthma Neg Hx   . Allergic rhinitis Neg Hx    History  Substance Use Topics  . Smoking status: Never Smoker   . Smokeless tobacco: Never Used  . Alcohol Use: No    Review of Systems All other systems reviewed and are negative Allergies  Review of patient's allergies indicates no known allergies.  Home Medications   Current Outpatient Rx  Name  Route  Sig  Dispense  Refill  . albuterol (PROVENTIL HFA;VENTOLIN HFA) 108 (90 BASE) MCG/ACT inhaler   Inhalation   Inhale 1 puff into the lungs every 6 (six) hours as needed for wheezing or shortness of breath. wheezing   1 Inhaler   1   . diphenhydrAMINE (BENADRYL) 25 MG tablet   Oral   Take 25 mg  by mouth every 8 (eight) hours as needed for itching.          . folic acid (FOLVITE) 1 MG tablet   Oral   Take 1 mg by mouth every morning.         Marland Kitchen HYDROmorphone (DILAUDID) 4 MG tablet   Oral   Take 1 tablet (4 mg total) by mouth every 4 (four) hours as needed for pain.   20 tablet   0   . hydroxyurea (HYDREA) 500 MG capsule   Oral   Take 2,000 mg by mouth every morning. May take with food to minimize GI side effects.         Marland Kitchen morphine (MS CONTIN) 60 MG 12 hr tablet   Oral   Take 1 tablet (60 mg total) by mouth 2 (two) times daily.   14 tablet   0   . promethazine (PHENERGAN) 25 MG tablet   Oral   Take 1 tablet (25 mg total) by mouth every 6 (six) hours as needed for nausea.   30 tablet   0   . sennosides-docusate sodium (SENOKOT-S) 8.6-50 MG tablet   Oral   Take 1 tablet by mouth  2 (two) times daily.          BP 106/59  Pulse 91  Temp(Src) 99.5 F (37.5 C) (Oral)  Resp 18  SpO2 99% Physical Exam  Nursing note and vitals reviewed. Constitutional: He is oriented to person, place, and time. He appears well-developed and well-nourished. No distress.  HENT:  Head: Normocephalic and atraumatic.  Eyes: Pupils are equal, round, and reactive to light.  Neck: Normal range of motion.  Cardiovascular: Normal rate and intact distal pulses.   Pulmonary/Chest: No respiratory distress.  Abdominal: Normal appearance. He exhibits no distension. There is no tenderness. There is no rebound.  Musculoskeletal: Normal range of motion.  Neurological: He is alert and oriented to person, place, and time. No cranial nerve deficit.  Skin: Skin is warm and dry. No rash noted. There is pallor.  Psychiatric: He has a normal mood and affect. His behavior is normal.    ED Course  Procedures (including critical care time)  Medications  promethazine (PHENERGAN) injection 25 mg (25 mg Intravenous Given 05/14/13 1636)  HYDROmorphone (DILAUDID) injection 1 mg (1 mg Intravenous Given  05/14/13 1757)  ketorolac (TORADOL) 30 MG/ML injection 30 mg (30 mg Intravenous Given 05/14/13 1631)  diphenhydrAMINE (BENADRYL) injection 25 mg (25 mg Intravenous Given 05/14/13 1639)    Labs Review Labs Reviewed  CBC WITH DIFFERENTIAL - Abnormal; Notable for the following:    WBC 10.7 (*)    RBC 1.90 (*)    Hemoglobin 6.5 (*)    HCT 19.1 (*)    MCV 100.5 (*)    MCH 34.2 (*)    RDW 17.2 (*)    Neutrophils Relative % 87 (*)    Neutro Abs 9.3 (*)    Lymphocytes Relative 8 (*)    All other components within normal limits  RETICULOCYTES - Abnormal; Notable for the following:    Retic Ct Pct 8.8 (*)    RBC. 1.90 (*)    All other components within normal limits  BASIC METABOLIC PANEL - Abnormal; Notable for the following:    Glucose, Bld 122 (*)    GFR calc non Af Amer 89 (*)    All other components within normal limits   Imaging Review No results found.  MDM   1. Sickle cell pain crisis   2. Sickle cell anemia        Nelia Shi, MD 05/14/13 [phone]

## 2013-05-15 DIAGNOSIS — F1920 Other psychoactive substance dependence, uncomplicated: Secondary | ICD-10-CM

## 2013-05-15 MED ORDER — HYDROMORPHONE HCL PF 2 MG/ML IJ SOLN
2.0000 mg | INTRAMUSCULAR | Status: DC | PRN
  Administered 2013-05-15 – 2013-05-16 (×8): 2 mg via INTRAVENOUS
  Filled 2013-05-15 (×8): qty 1

## 2013-05-15 MED ORDER — SODIUM CHLORIDE 0.9 % IV SOLN
INTRAVENOUS | Status: DC
  Administered 2013-05-15 – 2013-05-16 (×3): via INTRAVENOUS

## 2013-05-15 NOTE — Care Management Note (Addendum)
    Page 1 of 1   05/19/2013     2:39:57 PM   CARE MANAGEMENT NOTE 05/19/2013  Patient:  Dennis Bernard, Dennis Bernard   Account Number:  [id number]  Date Initiated:  05/15/2013  Documentation initiated by:  Lanier Clam  Subjective/Objective Assessment:   26Y/O M ADMITTED W/[facility].     Action/Plan:   FROM HOME.   Anticipated DC Date:  05/19/2013   Anticipated DC Plan:  HOME/SELF CARE  In-house referral  PCP / [facility]      DC Planning Services  CM consult      Choice offered to / List presented to:             Status of service:  Completed, signed off Medicare Important Message given?   (If response is "NO", the following Medicare IM given date fields will be blank) Date Medicare IM given:   Date Additional Medicare IM given:    Discharge Disposition:  HOME/SELF CARE  Per UR Regulation:  Reviewed for med. necessity/level of care/duration of stay  If discussed at Long Length of Stay Meetings, dates discussed:    Comments:  05/19/13 Lanecia Sliva RN,BSN NCM [phone] D/C HOME NO FURTHER NEEDS.  05/17/13 Murdock Jellison RN,BSN NCM [phone] REFERRAL FOR [facility] PCP.TC [facility] CASE MGR-WINNIE WHO INFORMED ME THAT PATIENT HAS A LETTER FROM [facility] TO ESTABLISH A NEW PCP, ALSO INFORMED THAT PATIENT HAS GONE TO SEVERAL [facility] OFFICES/SEVERAL PHARMACIES/ALSO STATES ATTENDING DIFFERENT COLLEGES.THERE IS NO CONTINUITY OF SERVICES.PROVIDED PATIENT W/COPY OF LETTER THAT WE WAS FAMILAR WITH.PROVIDED W/PCP F/U APPT @ [facility] SEE F/U SECT OF D/C.MD UPDATED.  05/15/13 Riggin Cuttino RN,BSN NCM [phone]

## 2013-05-15 NOTE — Progress Notes (Signed)
Lab notified me around 23:00 of blood having antibodies & too many to get pt blood ready for tonight's transfusion, & wanted me to notify attending.  Notified Lenny Pastel ,NP for [facility] group on call at 23:13. He said he would make a note for rounding MD.

## 2013-05-15 NOTE — Progress Notes (Signed)
[facility] PROGRESS NOTE  Dennis Bernard GNF:[id number] DOB: 18-Jan-1986 DOA: 05/14/2013 PCP: No primary provider on file.  Assessment/Plan  1-Sickle cell pain crisis:patient with sickle pain crisis and probably some aplastic component given low Hgb  -will admit to telemetry  -will continue prn pain meds, antiemetics and follow sickle cell admisison protocol  -will transfuse 2 units of PRBC's  -LDH slightly elevated at 293 and bilirubin WNL -continue IVF's -patient had hx of narcotic abuse behavior and will be cautious about over using them.   2-Sickle cell anemia: will continue folic acid and hydroxyurea; will transfuse 2 units of PRBC's   3-Chronic pain: continue home pain meds regimen and be careful with extra narcotics, given behavior in the past   4-Other malaise and fatigue: most likely due to anemia. Will provide supportive care and transfuse 2 units of PRBC's.   5-Neck pain: could be part of his pain crisis. Cervical x-ray w/o acute abnormalities.   6-allergic rhinitis and seasonal asthma: continue flonase and PRN albuterol   DVT: scd's    Code Status: Full Family Communication: no family at bedside Disposition Plan: home when medically stable   Consultants:  None   Procedures:  See below for x-ray report   Antibiotics:  None   HPI/Subjective: No nausea, no vomiting; reports pain on his back still to be a problem. Transfusion not done yet given positive antibodies.  Objective: Filed Vitals:   05/15/13 0504  BP: 96/83  Pulse: 70  Temp: 98 F (36.7 C)  Resp: 20    Intake/Output Summary (Last 24 hours) at 05/15/13 1029 Last data filed at 05/15/13 0600  Gross per 24 hour  Intake 2647.5 ml  Output   1400 ml  Net 1247.5 ml   Filed Weights   05/14/13 1845  Weight: 77.111 kg (170 lb)    Exam:   General:  NAD, no fever; still complaining of pain on his back  Cardiovascular: no tachycardia, no rubs or gallops  Respiratory: CTA  bilaterally  Abdomen: soft, NT, ND positive BS  Musculoskeletal: no edema, no cyanosis  Data Reviewed: Basic Metabolic Panel:  Recent Labs Lab 05/14/13 1605 05/14/13 2055  NA 137  --   K 4.1  --   CL 103  --   CO2 23  --   GLUCOSE 122*  --   BUN 11  --   CREATININE 1.11  --   CALCIUM 9.6  --   MG  --  2.2   Liver Function Tests:  Recent Labs Lab 05/14/13 2055  AST 13  ALT 6  ALKPHOS 63  BILITOT 1.0  PROT 6.6  ALBUMIN 3.3*   CBC:  Recent Labs Lab 05/14/13 1605  WBC 10.7*  NEUTROABS 9.3*  HGB 6.5*  HCT 19.1*  MCV 100.5*  PLT 400   Studies: Dg Chest 1 View  05/14/2013   *RADIOLOGY REPORT*  Clinical Data: Evaluate line placement  CHEST - 1 VIEW  Comparison: Prior radiograph from 02/18/2013  Findings: There has been interval placement of a right IJ power line with tip located in the distal SVC.  Previously seen left- sided central venous port has been removed.  Cardiac and mediastinal silhouettes are unchanged.  The lungs are hypoinflated.  Mild diffuse prominence of the bronchovascular markings may be secondary to hypoinflation.  No definite focal infiltrate identified.  No pleural effusion or pulmonary edema.  No pneumothorax.  Left shoulder arthroplasty again noted.  IMPRESSION:  Tip of right IJ power line in the distal  SVC.  No pneumothorax.   Original Report Authenticated By: Rise Mu, M.D.   Dg Cervical Spine 2 Or 3 Views  05/15/2013   *RADIOLOGY REPORT*  Clinical Data: Neck pain  CERVICAL SPINE - 2-3 VIEW  Comparison: None.  Findings: Seven cervical segments are well visualized.  Vertebral body height is well-maintained.  There are no changes to suggest acute fracture.  No soft tissue changes are seen.  The odontoid is within normal limits.  A right-sided PICC line is seen at the cavoatrial junction.  No other focal abnormality is noted.  IMPRESSION: No acute abnormalities seen.   Original Report Authenticated By: Alcide Clever, M.D.    Scheduled  Meds: . [COMPLETED] sodium chloride   Intravenous STAT  . folic acid  1 mg Oral q morning - 10a  . gabapentin  100 mg Oral BID  . hydroxyurea  2,000 mg Oral q morning - 10a  . ketorolac  30 mg Intravenous Q6H  . morphine  60 mg Oral BID  . pantoprazole  40 mg Oral Daily   Or  . pantoprazole (PROTONIX) IV  40 mg Intravenous Daily   Continuous Infusions: . sodium chloride 100 mL/hr at 05/15/13 0712  . sodium chloride 150 mL/hr at 05/15/13 0109    Principal Problem:   Sickle cell pain crisis Active Problems:   Sickle cell anemia   Chronic pain   Other malaise and fatigue    Time spent: > 30 minutes    Carletta Feasel  [facility] Pager [phone]. If 7PM-7AM, please contact night-coverage at [email or web address], password [facility] 05/15/2013, 10:29 AM  LOS: 1 day

## 2013-05-16 LAB — CBC
Platelets: 328 10*3/uL (ref 150–400)
RBC: 1.39 MIL/uL — ABNORMAL LOW (ref 4.22–5.81)
RDW: 17 % — ABNORMAL HIGH (ref 11.5–15.5)
WBC: 7.6 10*3/uL (ref 4.0–10.5)

## 2013-05-16 LAB — LACTATE DEHYDROGENASE: LDH: 201 U/L (ref 94–250)

## 2013-05-16 MED ORDER — FLUTICASONE PROPIONATE 50 MCG/ACT NA SUSP
1.0000 | Freq: Every day | NASAL | Status: DC
  Administered 2013-05-16 – 2013-05-19 (×4): 1 via NASAL
  Filled 2013-05-16: qty 16

## 2013-05-16 MED ORDER — HYDROMORPHONE HCL PF 2 MG/ML IJ SOLN
2.0000 mg | INTRAMUSCULAR | Status: DC | PRN
  Administered 2013-05-16 – 2013-05-18 (×16): 2 mg via INTRAVENOUS
  Filled 2013-05-16 (×16): qty 1

## 2013-05-16 NOTE — Progress Notes (Signed)
criCRITICAL VALUE ALERT  Critical value received:  Hgb: 4.8  Date of notification:  05/16/13  Time of notification:  06:08  Critical value read back:yes  Nurse who received alert:  Tamala Bari  MD notified (1st page):  K.Schoor,NP w/[facility]  Time of first page:  06:15  MD notified (2nd page):  Time of second page:  Responding MD:  Dillon Bjork  Time MD responded:  06:15

## 2013-05-16 NOTE — Progress Notes (Signed)
PHARMACIST - PHYSICIAN ORDER COMMUNICATION Concerning: P&T Medication Policy on Oral Chemo Medications  DESCRIPTION:  This patient's order for Hydroxyurea was resumed inpatient on 05/15/13 as been noted.  The pharmacy department discontinued this order due to the patient meeting the hold criteria (see below) for Hgb < 6.  Current Hgb is 4.8 today.  Hydroxyurea (Hydrea) hold criteria  ANC < 2  Pltc < 80K in sickle-cell patients; < 100K in other patients  Hgb <= 6 in sickle-cell patients; < 8 in other patients  Reticulocytes < 80K when Hgb < 9  Please re-evaluate patient's clinical condition and resume hydroxyurea if appropriate.   Lynann Beaver PharmD, BCPS Pager [phone] 05/16/2013 7:40 AM

## 2013-05-16 NOTE — Progress Notes (Signed)
[facility] PROGRESS NOTE  Dennis Bernard [id number] DOB: September 11, 1986 DOA: 05/14/2013 PCP: No primary provider on file.  Assessment/Plan  1-Sickle cell pain crisis:patient with sickle pain crisis and probably some aplastic component given low Hgb  -will continue telemetry for now -will continue prn pain meds, antiemetics and follow sickle cell admisison protocol  -will transfuse 2 units of PRBC's  -LDH slightly elevated at 293, will follow it especially after transfusion. Bilirubin WNL -continue IVF's; but change to 75cc/hr; patient drinking better and no complaining of N/V -patient had hx of narcotic abuse behavior and will be cautious about over using them.   2-Sickle cell anemia: will continue folic acid; hydroxyurea discontinue due to low Hgb; will transfuse 2 units of PRBC's (the less incompatible ones and follow any reactions). If needed will recommend solumedrol and benadryl.   3-Chronic pain: continue home pain meds regimen and be careful with extra narcotics, given behavior in the past   4-Other malaise and fatigue: most likely due to anemia. Will continue providing supportive care and transfuse 2 units of PRBC's.   5-Neck pain: could be part of his pain crisis. Cervical x-ray w/o acute abnormalities.   6-allergic rhinitis and seasonal asthma: continue flonase and PRN albuterol   DVT: scd's    Code Status: Full Family Communication: no family at bedside Disposition Plan: home when medically stable   Consultants:  None   Procedures:  See below for x-ray report   Antibiotics:  None   HPI/Subjective: No nausea, no vomiting and no fever; reports slight improvement; but still with fatigue general malaise and pain.  Objective: Filed Vitals:   05/16/13 1325  BP: 123/79  Pulse: 69  Temp: 98.4 F (36.9 C)  Resp: 16    Intake/Output Summary (Last 24 hours) at 05/16/13 1426 Last data filed at 05/16/13 1300  Gross per 24 hour  Intake   4700 ml   Output   3375 ml  Net   1325 ml   Filed Weights   05/14/13 1845  Weight: 77.111 kg (170 lb)    Exam:   General:  NAD, no fever; still complaining of pain on his back  Cardiovascular: no tachycardia, no rubs or gallops  Respiratory: CTA bilaterally  Abdomen: soft, NT, ND positive BS  Musculoskeletal: no edema, no cyanosis  Data Reviewed: Basic Metabolic Panel:  Recent Labs Lab 05/14/13 1605 05/14/13 2055  NA 137  --   K 4.1  --   CL 103  --   CO2 23  --   GLUCOSE 122*  --   BUN 11  --   CREATININE 1.11  --   CALCIUM 9.6  --   MG  --  2.2   Liver Function Tests:  Recent Labs Lab 05/14/13 2055  AST 13  ALT 6  ALKPHOS 63  BILITOT 1.0  PROT 6.6  ALBUMIN 3.3*   CBC:  Recent Labs Lab 05/14/13 1605 05/16/13 0500  WBC 10.7* 7.6  NEUTROABS 9.3*  --   HGB 6.5* 4.8*  HCT 19.1* 13.8*  MCV 100.5* 99.3  PLT 400 328   Studies: Dg Chest 1 View  05/14/2013   *RADIOLOGY REPORT*  Clinical Data: Evaluate line placement  CHEST - 1 VIEW  Comparison: Prior radiograph from 02/18/2013  Findings: There has been interval placement of a right IJ power line with tip located in the distal SVC.  Previously seen left- sided central venous port has been removed.  Cardiac and mediastinal silhouettes are unchanged.  The lungs are  hypoinflated.  Mild diffuse prominence of the bronchovascular markings may be secondary to hypoinflation.  No definite focal infiltrate identified.  No pleural effusion or pulmonary edema.  No pneumothorax.  Left shoulder arthroplasty again noted.  IMPRESSION:  Tip of right IJ power line in the distal SVC.  No pneumothorax.   Original Report Authenticated By: Rise Mu, M.D.   Dg Cervical Spine 2 Or 3 Views  05/15/2013   *RADIOLOGY REPORT*  Clinical Data: Neck pain  CERVICAL SPINE - 2-3 VIEW  Comparison: None.  Findings: Seven cervical segments are well visualized.  Vertebral body height is well-maintained.  There are no changes to suggest acute  fracture.  No soft tissue changes are seen.  The odontoid is within normal limits.  A right-sided PICC line is seen at the cavoatrial junction.  No other focal abnormality is noted.  IMPRESSION: No acute abnormalities seen.   Original Report Authenticated By: Alcide Clever, M.D.    Scheduled Meds: . folic acid  1 mg Oral q morning - 10a  . gabapentin  100 mg Oral BID  . ketorolac  30 mg Intravenous Q6H  . morphine  60 mg Oral BID  . pantoprazole  40 mg Oral Daily   Or  . pantoprazole (PROTONIX) IV  40 mg Intravenous Daily   Continuous Infusions: . sodium chloride 100 mL/hr at 05/15/13 1703    Principal Problem:   Sickle cell pain crisis Active Problems:   Sickle cell anemia   Chronic pain   Other malaise and fatigue    Time spent: > 30 minutes    [name]  [facility] Pager [phone]. If 7PM-7AM, please contact night-coverage at [email or web address], password [facility] 05/16/2013, 2:26 PM  LOS: 2 days

## 2013-05-17 ENCOUNTER — Inpatient Hospital Stay (HOSPITAL_COMMUNITY): Payer: Medicare Other

## 2013-05-17 DIAGNOSIS — Z9119 Patient's noncompliance with other medical treatment and regimen: Secondary | ICD-10-CM

## 2013-05-17 DIAGNOSIS — M549 Dorsalgia, unspecified: Secondary | ICD-10-CM

## 2013-05-17 LAB — COMPREHENSIVE METABOLIC PANEL
ALT: 7 U/L (ref 0–53)
AST: 14 U/L (ref 0–37)
Alkaline Phosphatase: 59 U/L (ref 39–117)
CO2: 23 mEq/L (ref 19–32)
GFR calc Af Amer: >90 mL/min (ref >90)
Glucose, Bld: 123 mg/dL — ABNORMAL HIGH (ref 70–99)
Potassium: 3.6 mEq/L (ref 3.5–5.1)
Sodium: 138 mEq/L (ref 135–145)
Total Protein: 6.1 g/dL (ref 6.0–8.3)

## 2013-05-17 LAB — CBC
Hemoglobin: 7.1 g/dL — ABNORMAL LOW (ref 13.0–17.0)
MCHC: 34.3 g/dL (ref 30.0–36.0)
Platelets: 342 10*3/uL (ref 150–400)
RBC: 2.15 MIL/uL — ABNORMAL LOW (ref 4.22–5.81)

## 2013-05-17 MED ORDER — HYDROXYUREA 500 MG PO CAPS
2000.0000 mg | ORAL_CAPSULE | Freq: Every day | ORAL | Status: DC
  Administered 2013-05-17 – 2013-05-19 (×3): 2000 mg via ORAL
  Filled 2013-05-17 (×3): qty 4

## 2013-05-17 NOTE — Progress Notes (Signed)
[facility] PROGRESS NOTE  Dennis Bernard ZOX:[id number] DOB: 1986-08-02 DOA: 05/14/2013 PCP: No primary provider on file.  Assessment/Plan  1-Sickle cell pain crisis:patient with sickle pain crisis and probably some aplastic component given low Hgb  -will continue telemetry for now -will continue prn pain meds, antiemetics and follow sickle cell admisison protocol  -will transfuse 2 units of PRBC's  -LDH slightly elevated at 293, will follow it especially after transfusion. Bilirubin WNL -continue IVF's; but change to 75cc/hr; patient drinking better and no complaining of N/V -patient had hx of narcotic abuse behavior and will be cautious about over using them.   2-Sickle cell anemia: will continue folic acid; hydroxyurea restarted as his H&H is up and stable. ; will transfuse 2 units of PRBC's (the less incompatible ones and follow any reactions). If needed will recommend solumedrol and benadryl.   3-Chronic pain: continue home pain meds regimen and be careful with extra narcotics, given behavior in the past   4-Other malaise and fatigue: most likely due to anemia. Will continue providing supportive care and transfuse 2 units of PRBC's.   5-Neck pain/ back pain:  could be part of his pain crisis. Cervical x-ray w/o acute abnormalities. X ra of lumbar spine ordered.   6-allergic rhinitis and seasonal asthma: continue flonase and PRN albuterol   DVT: scd's    Code Status: Full Family Communication: no family at bedside Disposition Plan: home when medically stable   Consultants:  None   Procedures:  See below for x-ray report   Antibiotics:  None   HPI/Subjective: No nausea, no vomiting and no fever; reports slight improvement; but still with fatigue general malaise and pain.  Objective: Filed Vitals:   05/17/13 0530  BP: 111/80  Pulse: 67  Temp: 98 F (36.7 C)  Resp: 18    Intake/Output Summary (Last 24 hours) at 05/17/13 0848 Last data filed at  05/17/13 0700  Gross per 24 hour  Intake 2770.41 ml  Output   4720 ml  Net -1949.59 ml   Filed Weights   05/14/13 1845  Weight: 77.111 kg (170 lb)    Exam:   General:  NAD, no fever; still complaining of pain on his back  Cardiovascular: no tachycardia, no rubs or gallops  Respiratory: CTA bilaterally  Abdomen: soft, NT, ND positive BS  Musculoskeletal: no edema, no cyanosis  Data Reviewed: Basic Metabolic Panel:  Recent Labs Lab 05/14/13 1605 05/14/13 2055 05/17/13 0615  NA 137  --  138  K 4.1  --  3.6  CL 103  --  108  CO2 23  --  23  GLUCOSE 122*  --  123*  BUN 11  --  10  CREATININE 1.11  --  1.19  CALCIUM 9.6  --  8.5  MG  --  2.2  --    Liver Function Tests:  Recent Labs Lab 05/14/13 2055 05/17/13 0615  AST 13 14  ALT 6 7  ALKPHOS 63 59  BILITOT 1.0 1.0  PROT 6.6 6.1  ALBUMIN 3.3* 3.0*   CBC:  Recent Labs Lab 05/14/13 1605 05/16/13 0500 05/17/13 0615  WBC 10.7* 7.6 6.3  NEUTROABS 9.3*  --   --   HGB 6.5* 4.8* 7.1*  HCT 19.1* 13.8* 20.7*  MCV 100.5* 99.3 96.3  PLT 400 328 342   Studies: No results found.  Scheduled Meds: . fluticasone  1 spray Each Nare Daily  . folic acid  1 mg Oral q morning - 10a  . gabapentin  100 mg Oral BID  . ketorolac  30 mg Intravenous Q6H  . morphine  60 mg Oral BID  . pantoprazole  40 mg Oral Daily   Or  . pantoprazole (PROTONIX) IV  40 mg Intravenous Daily   Continuous Infusions: . sodium chloride 75 mL/hr at 05/17/13 0430    Principal Problem:   Sickle cell pain crisis Active Problems:   Sickle cell anemia   Chronic pain   Other malaise and fatigue    Time spent:  25 minutes    Rosamae Rocque  [facility] Pager [phone]. If 7PM-7AM, please contact night-coverage at [email or web address], password [facility] 05/17/2013, 8:48 AM  LOS: 3 days

## 2013-05-17 NOTE — Progress Notes (Signed)
Patient was just given Dilaudid pain meds, as soon as this RN left the room patient manipulated IV pump and run IVF to 999cc/hr. Patient stated that the machine is beeping and was trying to silence it. RN was just outside the door coming back   in and did not hear any alarm going off from the IV pump.  Patient just  said "I'm sorry". MD made aware.

## 2013-05-17 NOTE — Progress Notes (Signed)
CMT notified RN that patient was having missed beats beginning around 04:30 this am.  No new complaints from patient.  Second unit of blood finished transfusing right at 04:10 am.  Will continue to monitor patient. [name], [name] [name]

## 2013-05-17 NOTE — Care Management (Signed)
This CM spoke with CM Dennis Bernard, regarding Dennis Bernard establishing care with [facility]. This CM advised CM Dennis Bernard. of letter dated 02/21/13 was mailed to Dennis Bernard and scanned to Epic on 04/16/13. [facility] previously attempted to establish care with Dennis Bernard however he was unable to uphold his responsibilities as a new patient.   CM Dennis Bernard. will print a copy of the letter and give another copy to Dennis Bernard for his records.  Karoline Caldwell, RN, BSN, [place]   [phone]

## 2013-05-18 LAB — TYPE AND SCREEN: Unit division: 0

## 2013-05-18 LAB — DIFFERENTIAL
Basophils Absolute: 0.1 10*3/uL (ref 0.0–0.1)
Basophils Relative: 1 % (ref 0–1)
Eosinophils Absolute: 0.2 10*3/uL (ref 0.0–0.7)
Lymphocytes Relative: 39 % (ref 12–46)
Lymphs Abs: 2.4 10*3/uL (ref 0.7–4.0)
Monocytes Absolute: 0.2 10*3/uL (ref 0.1–1.0)
Neutro Abs: 3.3 10*3/uL (ref 1.7–7.7)

## 2013-05-18 LAB — CBC
HCT: 23.9 % — ABNORMAL LOW (ref 39.0–52.0)
Hemoglobin: 8.2 g/dL — ABNORMAL LOW (ref 13.0–17.0)
MCH: 33.1 pg (ref 26.0–34.0)
MCHC: 34.3 g/dL (ref 30.0–36.0)
RDW: 17.3 % — ABNORMAL HIGH (ref 11.5–15.5)

## 2013-05-18 MED ORDER — KETOROLAC TROMETHAMINE 30 MG/ML IJ SOLN: 15.0000 mg | Freq: Four times a day (QID) | INTRAMUSCULAR | Status: DC

## 2013-05-18 MED ORDER — HYDROMORPHONE HCL 2 MG PO TABS
2.0000 mg | ORAL_TABLET | ORAL | Status: DC | PRN
  Administered 2013-05-18 – 2013-05-19 (×6): 2 mg via ORAL
  Filled 2013-05-18 (×6): qty 1

## 2013-05-18 MED ORDER — HYDROMORPHONE HCL PF 2 MG/ML IJ SOLN
2.0000 mg | INTRAMUSCULAR | Status: DC | PRN
  Administered 2013-05-18: 2 mg via INTRAVENOUS
  Filled 2013-05-18: qty 1

## 2013-05-18 NOTE — Progress Notes (Signed)
[facility] PROGRESS NOTE  Dennis Bernard ZOX:[id number] DOB: 08/05/1986 DOA: 05/14/2013 PCP: No primary provider on file.  Assessment/Plan  1-Sickle cell pain crisis:patient with sickle pain crisis and probably some aplastic component given low Hgb  -will continue telemetry for now -will continue prn pain meds, antiemetics and follow sickle cell admisison protocol  -will transfuse 2 units of PRBC's  -LDH slightly elevated at 293, will follow it especially after transfusion. Bilirubin WNL patient drinking better and no complaining of N/V -patient had hx of narcotic abuse behavior and will be cautious about over using them.   2-Sickle cell anemia: will continue folic acid; hydroxyurea restarted as his H&H is up and stable. ; will transfuse 2 units of PRBC's (the less incompatible ones and follow any reactions). If needed will recommend solumedrol and benadryl.   3-Chronic pain: continue home pain meds regimen and be careful with extra narcotics, given behavior in the past   4-Other malaise and fatigue: most likely due to anemia. Will continue providing supportive care and transfused 2 units of PRBC's.   5-Neck pain/ back pain:  could be part of his pain crisis. Cervical x-ray w/o acute abnormalities. X ra of lumbar spine ordered and negative for acute pathology.   6-allergic rhinitis and seasonal asthma: continue flonase and PRN albuterol   DVT: scd's    Code Status: Full Family Communication: no family at bedside Disposition Plan: home tomorrow.    Consultants:  None   Procedures:  See below for x-ray report   Antibiotics:  None   HPI/Subjective: No nausea, no vomiting and no fever; reports slight improvement; but still with fatigue general malaise and pain.  Objective: Filed Vitals:   05/18/13 0455  BP: 113/74  Pulse: 61  Temp: 98.4 F (36.9 C)  Resp: 18    Intake/Output Summary (Last 24 hours) at 05/18/13 0841 Last data filed at 05/18/13 0700  Gross per 24 hour  Intake   1560 ml  Output   3675 ml  Net  -2115 ml   Filed Weights   05/14/13 1845  Weight: 77.111 kg (170 lb)    Exam:   General:  NAD, no fever; still complaining of pain on his back  Cardiovascular: no tachycardia, no rubs or gallops  Respiratory: CTA bilaterally  Abdomen: soft, NT, ND positive BS  Musculoskeletal: no edema, no cyanosis  Data Reviewed: Basic Metabolic Panel:  Recent Labs Lab 05/14/13 1605 05/14/13 2055 05/17/13 0615  NA 137  --  138  K 4.1  --  3.6  CL 103  --  108  CO2 23  --  23  GLUCOSE 122*  --  123*  BUN 11  --  10  CREATININE 1.11  --  1.19  CALCIUM 9.6  --  8.5  MG  --  2.2  --    Liver Function Tests:  Recent Labs Lab 05/14/13 2055 05/17/13 0615  AST 13 14  ALT 6 7  ALKPHOS 63 59  BILITOT 1.0 1.0  PROT 6.6 6.1  ALBUMIN 3.3* 3.0*   CBC:  Recent Labs Lab 05/14/13 1605 05/16/13 0500 05/17/13 0615  WBC 10.7* 7.6 6.3  NEUTROABS 9.3*  --   --   HGB 6.5* 4.8* 7.1*  HCT 19.1* 13.8* 20.7*  MCV 100.5* 99.3 96.3  PLT 400 328 342   Studies: Dg Lumbar Spine Complete  05/17/2013   *RADIOLOGY REPORT*  Clinical Data: Back pain for 4 days  LUMBAR SPINE - COMPLETE 4+ VIEW  Comparison: 08/26/2012  Findings: Normal alignment with no fracture.  Again noted is mild endplate sclerosis and depression consistent with sickle cell change.  Osteosclerosis of the sacrum stable.  Sclerotic lesion right femoral head possibly avascular necrosis, stable.  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  No acute findings in the lumbar spine.   Original Report Authenticated By: Esperanza Heir, M.D.    Scheduled Meds: . fluticasone  1 spray Each Nare Daily  . folic acid  1 mg Oral q morning - 10a  . gabapentin  100 mg Oral BID  . hydroxyurea  2,000 mg Oral Daily  . ketorolac  30 mg Intravenous Q6H  . morphine  60 mg Oral BID  . pantoprazole  40 mg Oral  Daily   Or  . pantoprazole (PROTONIX) IV  40 mg Intravenous Daily   Continuous Infusions: . sodium chloride 75 mL/hr at 05/18/13 0604    Principal Problem:   Sickle cell pain crisis Active Problems:   Sickle cell anemia   Chronic pain   Other malaise and fatigue    Time spent:  25 minutes    Dennis Bernard  [facility] Pager [phone]. If 7PM-7AM, please contact night-coverage at [email or web address], password [facility] 05/18/2013, 8:41 AM  LOS: 4 days

## 2013-05-19 DIAGNOSIS — G894 Chronic pain syndrome: Secondary | ICD-10-CM

## 2013-05-19 MED ORDER — HYDROMORPHONE HCL 4 MG PO TABS: 4.0000 mg | ORAL_TABLET | ORAL | Status: DC | PRN

## 2013-05-19 MED ORDER — FLUTICASONE PROPIONATE 50 MCG/ACT NA SUSP: 1.0000 | Freq: Every day | NASAL | Status: DC

## 2013-05-19 MED ORDER — GABAPENTIN 100 MG PO CAPS: 100.0000 mg | ORAL_CAPSULE | Freq: Two times a day (BID) | ORAL | Status: DC

## 2013-05-19 MED ORDER — HEPARIN SOD (PORK) LOCK FLUSH 100 UNIT/ML IV SOLN
250.0000 [IU] | INTRAVENOUS | Status: AC | PRN
  Administered 2013-05-19: 12:00:00 250 [IU]

## 2013-05-19 MED ORDER — MORPHINE SULFATE ER 60 MG PO TBCR: 60.0000 mg | EXTENDED_RELEASE_TABLET | Freq: Two times a day (BID) | ORAL | Status: DC

## 2013-05-19 NOTE — Progress Notes (Signed)
Patient discharge ambulatory, no complaints of any pain or discomfort upon discharged. D/c instructions and follow up appointment s done and was given to the patient, verbalized understanding. Discharge with PICC , clarified with patient , he stated that it was put in [facility]  Because he is a hard stick and will be taken out in 3 weeks, I called IR in [facility] and was told that patient needs to call to set up an appointment  When its the for the PICC to be removed, MD is aware. Given IR #and contact person for appt . Inetta Fermo [phone] - IR. PICC Heplock by IV RN.

## 2013-05-20 NOTE — Discharge Summary (Signed)
Physician Discharge Summary  Dennis Bernard WUX:[id number] DOB: [birth year] DOA: 05/14/2013  PCP: No primary provider on file.  Admit date: 05/14/2013 Discharge date: 05/19/2013  Time spent: 40 minutes  Recommendations for Outpatient Follow-up:  1. Follow up with IR for removal of temporary PICC LINE and for port a cath placement.  2. Follow up with PCP in one week and check cbc in one week.   Discharge Diagnoses:  Principal Problem:   Sickle cell pain crisis Active Problems:   Sickle cell anemia   Chronic pain   Other malaise and fatigue   Discharge Condition: improved  Diet recommendation: regular  Filed Weights   05/14/13 1845  Weight: 77.111 kg (170 lb)    History of present illness:  General Wearing is a 27 y.o. male with pmh significant for sickle cell disease, narcotic missed use behavior, allergic rhinitis and seasonal asthma; came to ED complaining of back pain and neck. Patient reports pain is similar to previous crisis except for neck discomfort; he denies fever, chills, abd pain, dysuria, CP, SOB or palpitations. Also denies HA's blurred vision and photophobia. Patient endorses son nausea and vomiting; making him unable to use home PO medications. [facility] called to admit patient for further evaluation and treatment.   Hospital Course:  1-Sickle cell pain crisis:patient with sickle pain crisis and probably some aplastic component given low Hgb  - He was started on IV fluids, and 2 units of prbc transfusions given and his H&H improved. An dhis pain improved and he is discharged home to follow u pwith PCP in one week.  -will continue prn pain meds, antiemetics and follow sickle cell admisison protocol  Bilirubin WNL  patient drinking better and no complaining of N/V  -patient had hx of narcotic abuse behavior and will be cautious about over using them.  2-Sickle cell anemia: will continue folic acid; hydroxyurea restarted as his H&H is up and stable. ;  transfused 2  units of PRBC's  3-Chronic pain: continue home pain meds regimen  4-Other malaise and fatigue: most likely due to anemia. Will continue providing supportive care and transfused 2 units of PRBC's.  5-Neck pain/ back pain: could be part of his pain crisis. Cervical x-ray w/o acute abnormalities. X ra of lumbar spine ordered and negative for acute pathology.  6-allergic rhinitis and seasonal asthma: continue flonase and PRN albuterol      Procedures:  NONE   Consultations:  NONE  Discharge Exam: Filed Vitals:   05/19/13 0530  BP: 114/66  Pulse: 78  Temp: 98.6 F (37 C)  Resp: 16    General: alert afebrile comfortable Cardiovascular: s1s2 Respiratory: CTAB  Discharge Instructions  Discharge Orders   Future Appointments Provider Department Dept Phone   05/30/2013 12:00 PM Jeanann Lewandowsky, MD [facility] [phone]   Future Orders Complete By Expires   Discharge instructions  As directed    Comments:     Follow upwith PCP inone week.       Medication List         albuterol 108 (90 BASE) MCG/ACT inhaler  Commonly known as:  PROVENTIL HFA;VENTOLIN HFA  Inhale 1 puff into the lungs every 6 (six) hours as needed for wheezing or shortness of breath. wheezing     diphenhydrAMINE 25 MG tablet  Commonly known as:  BENADRYL  Take 25 mg by mouth every 8 (eight) hours as needed for itching.     fluticasone 50 MCG/ACT nasal spray  Commonly known as:  FLONASE  Place 1 spray into the nose daily.     folic acid 1 MG tablet  Commonly known as:  FOLVITE  Take 1 mg by mouth every morning.     gabapentin 100 MG capsule  Commonly known as:  NEURONTIN  Take 1 capsule (100 mg total) by mouth 2 (two) times daily.     HYDROmorphone 4 MG tablet  Commonly known as:  DILAUDID  Take 1 tablet (4 mg total) by mouth every 4 (four) hours as needed for pain.     hydroxyurea 500 MG capsule  Commonly known as:  HYDREA  Take 2,000 mg by mouth every  morning. May take with food to minimize GI side effects.     morphine 60 MG 12 hr tablet  Commonly known as:  MS CONTIN  Take 1 tablet (60 mg total) by mouth 2 (two) times daily.     promethazine 25 MG tablet  Commonly known as:  PHENERGAN  Take 1 tablet (25 mg total) by mouth every 6 (six) hours as needed for nausea.     sennosides-docusate sodium 8.6-50 MG tablet  Commonly known as:  SENOKOT-S  Take 1 tablet by mouth 2 (two) times daily.       No Known Allergies     Follow-up Information   Follow up with Jeanann Lewandowsky, MD On 05/30/2013. (@ 12P/BRING PHOTO ID/MEDICINE IN BOTTLES)    Specialty:  Internal Medicine   Contact information:   [address] [place] [address] [phone]        The results of significant diagnostics from this hospitalization (including imaging, microbiology, ancillary and laboratory) are listed below for reference.    Significant Diagnostic Studies: Dg Chest 1 View  05/14/2013   *RADIOLOGY REPORT*  Clinical Data: Evaluate line placement  CHEST - 1 VIEW  Comparison: Prior radiograph from 02/18/2013  Findings: There has been interval placement of a right IJ power line with tip located in the distal SVC.  Previously seen left- sided central venous port has been removed.  Cardiac and mediastinal silhouettes are unchanged.  The lungs are hypoinflated.  Mild diffuse prominence of the bronchovascular markings may be secondary to hypoinflation.  No definite focal infiltrate identified.  No pleural effusion or pulmonary edema.  No pneumothorax.  Left shoulder arthroplasty again noted.  IMPRESSION:  Tip of right IJ power line in the distal SVC.  No pneumothorax.   Original Report Authenticated By: Rise Mu, M.D.   Dg Cervical Spine 2 Or 3 Views  05/15/2013   *RADIOLOGY REPORT*  Clinical Data: Neck pain  CERVICAL SPINE - 2-3 VIEW  Comparison: None.  Findings: Seven cervical segments are well visualized.  Vertebral body height is  well-maintained.  There are no changes to suggest acute fracture.  No soft tissue changes are seen.  The odontoid is within normal limits.  A right-sided PICC line is seen at the cavoatrial junction.  No other focal abnormality is noted.  IMPRESSION: No acute abnormalities seen.   Original Report Authenticated By: Alcide Clever, M.D.   Dg Lumbar Spine Complete  05/17/2013   *RADIOLOGY REPORT*  Clinical Data: Back pain for 4 days  LUMBAR SPINE - COMPLETE 4+ VIEW  Comparison: 08/26/2012  Findings: Normal alignment with no fracture.  Again noted is mild endplate sclerosis and depression consistent with sickle cell change.  Osteosclerosis of the sacrum stable.  Sclerotic lesion right femoral head possibly avascular necrosis, stable.  IMPRESSION: Findings consistent with sickle cell disease and possible but  stable avascular necrosis right femoral head.  No acute findings in the lumbar spine.   Original Report Authenticated By: Esperanza Heir, M.D.    Microbiology: No results found for this or any previous visit (from the past 240 hour(s)).   Labs: Basic Metabolic Panel:  Recent Labs Lab 05/14/13 1605 05/14/13 2055 05/17/13 0615  NA 137  --  138  K 4.1  --  3.6  CL 103  --  108  CO2 23  --  23  GLUCOSE 122*  --  123*  BUN 11  --  10  CREATININE 1.11  --  1.19  CALCIUM 9.6  --  8.5  MG  --  2.2  --    Liver Function Tests:  Recent Labs Lab 05/14/13 2055 05/17/13 0615  AST 13 14  ALT 6 7  ALKPHOS 63 59  BILITOT 1.0 1.0  PROT 6.6 6.1  ALBUMIN 3.3* 3.0*   No results found for this basename: LIPASE, AMYLASE,  in the last 168 hours No results found for this basename: AMMONIA,  in the last 168 hours CBC:  Recent Labs Lab 05/14/13 1605 05/16/13 0500 05/17/13 0615 05/18/13 0930  WBC 10.7* 7.6 6.3 6.2  NEUTROABS 9.3*  --   --  3.3  HGB 6.5* 4.8* 7.1* 8.2*  HCT 19.1* 13.8* 20.7* 23.9*  MCV 100.5* 99.3 96.3 96.4  PLT 400 328 342 474*   Cardiac Enzymes: No results found for this  basename: CKTOTAL, CKMB, CKMBINDEX, TROPONINI,  in the last 168 hours BNP: BNP (last 3 results) No results found for this basename: PROBNP,  in the last 8760 hours CBG: No results found for this basename: GLUCAP,  in the last 168 hours     Signed:  Gelena Klosinski  [facility] 05/19/2013, 5:47 PM

## 2013-05-28 ENCOUNTER — Emergency Department (HOSPITAL_COMMUNITY): Payer: Medicare Other

## 2013-05-28 ENCOUNTER — Inpatient Hospital Stay (HOSPITAL_COMMUNITY)
Admission: EM | Admit: 2013-05-28 | Discharge: 2013-06-05 | DRG: 812 | Disposition: A | Discharge status: Home / Self Care | Payer: Medicare Other | Attending: Internal Medicine | Admitting: Internal Medicine

## 2013-05-28 ENCOUNTER — Encounter (HOSPITAL_COMMUNITY): Payer: Self-pay | Admitting: Emergency Medicine

## 2013-05-28 DIAGNOSIS — Z91148 Patient's other noncompliance with medication regimen for other reason: Secondary | ICD-10-CM

## 2013-05-28 DIAGNOSIS — E876 Hypokalemia: Secondary | ICD-10-CM | POA: Diagnosis present

## 2013-05-28 DIAGNOSIS — D649 Anemia, unspecified: Secondary | ICD-10-CM

## 2013-05-28 DIAGNOSIS — R509 Fever, unspecified: Secondary | ICD-10-CM

## 2013-05-28 DIAGNOSIS — T8092XA Unspecified transfusion reaction, initial encounter: Secondary | ICD-10-CM | POA: Diagnosis not present

## 2013-05-28 DIAGNOSIS — R2232 Localized swelling, mass and lump, left upper limb: Secondary | ICD-10-CM

## 2013-05-28 DIAGNOSIS — Z87898 Personal history of other specified conditions: Secondary | ICD-10-CM

## 2013-05-28 DIAGNOSIS — F1191 Opioid use, unspecified, in remission: Secondary | ICD-10-CM

## 2013-05-28 DIAGNOSIS — T4275XA Adverse effect of unspecified antiepileptic and sedative-hypnotic drugs, initial encounter: Secondary | ICD-10-CM | POA: Diagnosis present

## 2013-05-28 DIAGNOSIS — R5381 Other malaise: Secondary | ICD-10-CM | POA: Diagnosis present

## 2013-05-28 DIAGNOSIS — Y849 Medical procedure, unspecified as the cause of abnormal reaction of the patient, or of later complication, without mention of misadventure at the time of the procedure: Secondary | ICD-10-CM | POA: Diagnosis not present

## 2013-05-28 DIAGNOSIS — E875 Hyperkalemia: Secondary | ICD-10-CM | POA: Diagnosis not present

## 2013-05-28 DIAGNOSIS — T80A0XA Non-ABO incompatibility reaction due to transfusion of blood or blood products, unspecified, initial encounter: Secondary | ICD-10-CM

## 2013-05-28 DIAGNOSIS — K5909 Other constipation: Secondary | ICD-10-CM | POA: Diagnosis present

## 2013-05-28 DIAGNOSIS — D5700 Hb-SS disease with crisis, unspecified: Principal | ICD-10-CM | POA: Diagnosis present

## 2013-05-28 DIAGNOSIS — F1120 Opioid dependence, uncomplicated: Secondary | ICD-10-CM

## 2013-05-28 DIAGNOSIS — D571 Sickle-cell disease without crisis: Secondary | ICD-10-CM | POA: Diagnosis present

## 2013-05-28 DIAGNOSIS — G894 Chronic pain syndrome: Secondary | ICD-10-CM

## 2013-05-28 DIAGNOSIS — M549 Dorsalgia, unspecified: Secondary | ICD-10-CM | POA: Diagnosis present

## 2013-05-28 DIAGNOSIS — G8929 Other chronic pain: Secondary | ICD-10-CM | POA: Diagnosis present

## 2013-05-28 DIAGNOSIS — Z9114 Patient's other noncompliance with medication regimen: Secondary | ICD-10-CM

## 2013-05-28 DIAGNOSIS — T80A0XD Non-ABO incompatibility reaction due to transfusion of blood or blood products, unspecified, subsequent encounter: Secondary | ICD-10-CM

## 2013-05-28 HISTORY — DX: Non-abo incompatibility reaction due to transfusion of blood or blood products, unspecified, initial encounter: T80.A0XA

## 2013-05-28 LAB — COMPREHENSIVE METABOLIC PANEL
Alkaline Phosphatase: 80 U/L (ref 39–117)
BUN: 19 mg/dL (ref 6–23)
CO2: 24 mEq/L (ref 19–32)
Chloride: 99 mEq/L (ref 96–112)
Creatinine, Ser: 1.13 mg/dL (ref 0.50–1.35)
GFR calc non Af Amer: 87 mL/min — ABNORMAL LOW (ref >90)
Glucose, Bld: 101 mg/dL — ABNORMAL HIGH (ref 70–99)
Potassium: 3.4 mEq/L — ABNORMAL LOW (ref 3.5–5.1)
Total Bilirubin: 0.6 mg/dL (ref 0.3–1.2)

## 2013-05-28 LAB — RETICULOCYTES
RBC.: 1 MIL/uL — ABNORMAL LOW (ref 4.22–5.81)
Retic Ct Pct: 16.4 % — ABNORMAL HIGH (ref 0.4–3.1)

## 2013-05-28 LAB — CBC WITH DIFFERENTIAL/PLATELET
Basophils Relative: 0 % (ref 0–1)
Eosinophils Relative: 0 % (ref 0–5)
HCT: 10.8 % — ABNORMAL LOW (ref 39.0–52.0)
Hemoglobin: 3.7 g/dL — CL (ref 13.0–17.0)
Lymphs Abs: 2.5 10*3/uL (ref 0.7–4.0)
MCH: 37 pg — ABNORMAL HIGH (ref 26.0–34.0)
MCV: 108 fL — ABNORMAL HIGH (ref 78.0–100.0)
Neutro Abs: 8.1 10*3/uL — ABNORMAL HIGH (ref 1.7–7.7)
Platelets: 433 10*3/uL — ABNORMAL HIGH (ref 150–400)
RBC: 1 MIL/uL — ABNORMAL LOW (ref 4.22–5.81)
nRBC: 11 /100 WBC — ABNORMAL HIGH

## 2013-05-28 LAB — CG4 I-STAT (LACTIC ACID): Lactic Acid, Venous: 1.29 mmol/L (ref 0.5–2.2)

## 2013-05-28 MED ORDER — POLYETHYLENE GLYCOL 3350 17 G PO PACK
17.0000 g | PACK | Freq: Two times a day (BID) | ORAL | Status: DC
  Administered 2013-05-28 – 2013-06-04 (×14): 17 g via ORAL
  Filled 2013-05-28 (×17): qty 1

## 2013-05-28 MED ORDER — ONDANSETRON HCL 4 MG/2ML IJ SOLN
4.0000 mg | Freq: Once | INTRAMUSCULAR | Status: AC
  Administered 2013-05-28: 4 mg via INTRAVENOUS
  Filled 2013-05-28: qty 2

## 2013-05-28 MED ORDER — DIPHENHYDRAMINE HCL 50 MG/ML IJ SOLN
12.5000 mg | Freq: Once | INTRAMUSCULAR | Status: AC
  Administered 2013-05-28: 12.5 mg via INTRAVENOUS
  Filled 2013-05-28: qty 1

## 2013-05-28 MED ORDER — ONDANSETRON HCL 4 MG/2ML IJ SOLN: 4.0000 mg | INTRAMUSCULAR | Status: DC | PRN

## 2013-05-28 MED ORDER — SODIUM CHLORIDE 0.45 % IV SOLN
INTRAVENOUS | Status: DC
  Administered 2013-05-28 – 2013-05-29 (×2): via INTRAVENOUS

## 2013-05-28 MED ORDER — DIPHENHYDRAMINE HCL 50 MG/ML IJ SOLN: 12.5000 mg | INTRAMUSCULAR | Status: DC | PRN

## 2013-05-28 MED ORDER — DIPHENHYDRAMINE HCL 50 MG/ML IJ SOLN
25.0000 mg | Freq: Once | INTRAMUSCULAR | Status: AC
  Administered 2013-05-28: 25 mg via INTRAVENOUS
  Filled 2013-05-28: qty 1

## 2013-05-28 MED ORDER — SODIUM CHLORIDE 0.9 % IV BOLUS (SEPSIS)
500.0000 mL | Freq: Once | INTRAVENOUS | Status: AC
  Administered 2013-05-28: 1000 mL via INTRAVENOUS

## 2013-05-28 MED ORDER — HEPARIN SODIUM (PORCINE) 5000 UNIT/ML IJ SOLN
5000.0000 [IU] | Freq: Three times a day (TID) | INTRAMUSCULAR | Status: DC
  Administered 2013-05-28 – 2013-05-29 (×2): 5000 [IU] via SUBCUTANEOUS
  Filled 2013-05-28 (×5): qty 1

## 2013-05-28 MED ORDER — KETOROLAC TROMETHAMINE 30 MG/ML IJ SOLN
30.0000 mg | Freq: Four times a day (QID) | INTRAMUSCULAR | Status: AC
  Administered 2013-05-28 – 2013-06-02 (×20): 30 mg via INTRAVENOUS
  Filled 2013-05-28 (×25): qty 1

## 2013-05-28 MED ORDER — HYDROMORPHONE HCL PF 2 MG/ML IJ SOLN
3.0000 mg | Freq: Once | INTRAMUSCULAR | Status: AC
  Administered 2013-05-28: 3 mg via INTRAVENOUS
  Filled 2013-05-28: qty 2

## 2013-05-28 MED ORDER — DIPHENHYDRAMINE HCL 25 MG PO CAPS: 25.0000 mg | ORAL_CAPSULE | ORAL | Status: DC | PRN

## 2013-05-28 MED ORDER — ONDANSETRON HCL 4 MG PO TABS: 4.0000 mg | ORAL_TABLET | ORAL | Status: DC | PRN

## 2013-05-28 MED ORDER — HYDROMORPHONE HCL PF 2 MG/ML IJ SOLN
4.0000 mg | INTRAMUSCULAR | Status: DC | PRN
  Administered 2013-05-28 – 2013-05-29 (×7): 4 mg via INTRAVENOUS
  Filled 2013-05-28 (×7): qty 2

## 2013-05-28 MED ORDER — FOLIC ACID 1 MG PO TABS
1.0000 mg | ORAL_TABLET | Freq: Every day | ORAL | Status: DC
  Administered 2013-05-29 – 2013-06-05 (×9): 1 mg via ORAL
  Filled 2013-05-28 (×9): qty 1

## 2013-05-28 NOTE — ED Provider Notes (Signed)
CSN: [id number]     Arrival date & time 05/28/13  1916 History   First MD Initiated Contact with Patient 05/28/13 1922     No chief complaint on file.  (Consider location/radiation/quality/duration/timing/severity/associated sxs/prior Treatment) HPI Comments: During last hospitalization it was his first transfusion since 2009  Patient is a 27 y.o. male presenting with sickle cell pain. The history is provided by the patient.  Sickle Cell Pain Crisis Location:  Back Severity:  Severe Onset quality:  Gradual Duration:  2 days Similar to previous crisis episodes: yes   Timing:  Constant Progression:  Worsening Chronicity:  Recurrent Sickle cell genotype:  SS Usual hemoglobin level:  9 Date of last transfusion:  10 days ago Frequency of attacks:  Often History of pulmonary emboli: no   Context: dehydration   Context: not alcohol consumption, not cold exposure, not infection, not non-compliance and not stress   Context comment:  Pt states over the last 2 days very fatigued and SOB when walking up stairs and lightheaded with near syncope Relieved by:  Nothing Worsened by:  Activity Ineffective treatments:  Prescription drugs and rest Associated symptoms: fatigue, headaches, nausea and vomiting   Associated symptoms: no chest pain, no congestion, no cough and no fever   Associated symptoms comment:  States it is common to get N/V with crisis and unable to hold much down for the last 2 days Risk factors: frequent pain crises   Risk factors: no hx of stroke     Past Medical History  Diagnosis Date  . Sickle cell disease   . Avascular necrosis of femur head, right   . H/O allergic rhinitis   . H/O hypokalemia   . Chronic pain syndrome   . H/O wheezing     with colds   Past Surgical History  Procedure Laterality Date  . Cholecystectomy  1995  . Portacath placement Left 07/04/2012  . Exchange transfusion  02/2008    perioperatively for shoulder surgery  . Total shoulder  replacement Left 04/09/2008   Family History  Problem Relation Age of Onset  . Sickle cell anemia Brother   . Sickle cell trait Father   . Sickle cell trait Mother   . Diabetes Mellitus II Mother   . Sickle cell anemia Paternal Uncle   . Asthma Neg Hx   . Allergic rhinitis Neg Hx    History  Substance Use Topics  . Smoking status: Never Smoker   . Smokeless tobacco: Never Used  . Alcohol Use: No    Review of Systems  Constitutional: Positive for fatigue. Negative for fever.  HENT: Negative for congestion.   Respiratory: Negative for cough.   Cardiovascular: Negative for chest pain.  Gastrointestinal: Positive for nausea and vomiting.  Neurological: Positive for headaches.  All other systems reviewed and are negative.    Allergies  Review of patient's allergies indicates no known allergies.  Home Medications   Current Outpatient Rx  Name  Route  Sig  Dispense  Refill  . albuterol (PROVENTIL HFA;VENTOLIN HFA) 108 (90 BASE) MCG/ACT inhaler   Inhalation   Inhale 1 puff into the lungs every 6 (six) hours as needed for wheezing or shortness of breath. wheezing   1 Inhaler   1   . diphenhydrAMINE (BENADRYL) 25 MG tablet   Oral   Take 25 mg by mouth every 8 (eight) hours as needed for itching.          . fluticasone (FLONASE) 50 MCG/ACT nasal spray   Nasal  Place 1 spray into the nose daily.   16 g   0   . folic acid (FOLVITE) 1 MG tablet   Oral   Take 1 mg by mouth every morning.         . gabapentin (NEURONTIN) 100 MG capsule   Oral   Take 1 capsule (100 mg total) by mouth 2 (two) times daily.   60 capsule   0   . HYDROmorphone (DILAUDID) 4 MG tablet   Oral   Take 1 tablet (4 mg total) by mouth every 4 (four) hours as needed for pain.   20 tablet   0   . hydroxyurea (HYDREA) 500 MG capsule   Oral   Take 2,000 mg by mouth every morning. May take with food to minimize GI side effects.         Marland Kitchen morphine (MS CONTIN) 60 MG 12 hr tablet   Oral    Take 1 tablet (60 mg total) by mouth 2 (two) times daily.   14 tablet   0   . promethazine (PHENERGAN) 25 MG tablet   Oral   Take 1 tablet (25 mg total) by mouth every 6 (six) hours as needed for nausea.   30 tablet   0   . sennosides-docusate sodium (SENOKOT-S) 8.6-50 MG tablet   Oral   Take 1 tablet by mouth 2 (two) times daily.          BP 127/81  Pulse 114  Temp(Src) 98.9 F (37.2 C) (Oral)  Resp 16  SpO2 100% Physical Exam  Nursing note and vitals reviewed. Constitutional: He is oriented to person, place, and time. He appears well-developed and well-nourished. No distress.  HENT:  Head: Normocephalic and atraumatic.  Mouth/Throat: Oropharynx is clear and moist and mucous membranes are normal.  Eyes: Conjunctivae and EOM are normal. Pupils are equal, round, and reactive to light.  Neck: Normal range of motion. Neck supple.  Cardiovascular: Regular rhythm and intact distal pulses.  Tachycardia present.   Murmur heard.  Systolic murmur is present with a grade of 2/6  Flow murmur  Pulmonary/Chest: Effort normal and breath sounds normal. No respiratory distress. He has no wheezes. He has no rales.  Abdominal: Soft. He exhibits no distension. There is no tenderness. There is no rebound and no guarding.  Musculoskeletal: Normal range of motion. He exhibits tenderness. He exhibits no edema.       Lumbar back: He exhibits tenderness and pain. He exhibits no bony tenderness.       Back:  Neurological: He is alert and oriented to person, place, and time.  Skin: Skin is warm and dry. No rash noted. No erythema. There is pallor.  Psychiatric: He has a normal mood and affect. His behavior is normal.    ED Course  Procedures (including critical care time) Labs Review Labs Reviewed  CBC WITH DIFFERENTIAL - Abnormal; Notable for the following:    WBC 11.8 (*)    RBC 1.00 (*)    Hemoglobin 3.7 (*)    HCT 10.8 (*)    MCV 108.0 (*)    MCH 37.0 (*)    RDW 19.2 (*)     Platelets 433 (*)    All other components within normal limits  RETICULOCYTES - Abnormal; Notable for the following:    Retic Ct Pct 16.4 (*)    RBC. 1.00 (*)    All other components within normal limits  COMPREHENSIVE METABOLIC PANEL  CG4 I-STAT (LACTIC ACID)  TYPE  AND SCREEN  PREPARE RBC (CROSSMATCH)   Imaging Review Dg Chest Port 1 View  05/28/2013   CLINICAL DATA:  Sickle cell crisis. Weakness.  EXAM: PORTABLE CHEST - 1 VIEW  COMPARISON:  Chest x-ray 05/14/2013.  FINDINGS: Lordotic positioning. Right internal jugular central venous catheter with tip terminating in the proximal the mid superior vena cava, approximately 3 cm above the position noted on the prior examination. Lung volumes are low. No acute consolidative airspace disease. No pleural effusions. No evidence of pulmonary edema. Heart size is borderline enlarged. Upper mediastinal contours are unremarkable.  IMPRESSION: 1. Low lung volumes without radiographic evidence of acute cardiopulmonary disease.  2. Position of right internal jugular central venous catheter appears higher than the prior study. Although this may partially related to lordotic positioning on today's examination, clinical correlation to exclude catheter migration is recommended.   Electronically Signed   By: Trudie Reed M.D.   On: 05/28/2013 20:21     Date: 05/28/2013  Rate: 110  Rhythm: sinus tachycardia  QRS Axis: normal  Intervals: normal  ST/T Wave abnormalities: nonspecific ST/T changes  Conduction Disutrbances:none  Narrative Interpretation:   Old EKG Reviewed: unchanged   MDM  No diagnosis found.  Patient presenting with complaints of weakness with exertion, shortness of breath with exertion and feeling near syncopal. Also having lower back pain consistent with a sickle cell crisis as well as nausea and vomiting for the last 2 days. He's been taking his home Dilaudid and OxyContin without relief. He states the only thing different about this  crisis is the near-syncope and weakness with exertion. He was last hospitalized 10 days ago and in that time received 2 units of blood to 2 drop in hemoglobin from 9-7. He denies any bleeding and no dark stools or bloody emesis.  Patient is pale tachycardic and uncomfortable on exam hemodynamically stable. He is afebrile and he denies any symptoms concerning for acute chest. Concern for acute sequestration with acute anemia given his symptoms of weakness, shortness of breath with exertion and near-syncope.  CBC, CMP, retic count, type and cross, chest x-ray and EKG pending. Patient given Dilaudid, Benadryl and Zofran.  8:33 PM Patient's chest x-ray without acute change. Today patient's hemoglobin is 3.7 which is an acute drop from 8.4 over the last one week. Will transfuse 4 units and admit patient for further treatment.  Patient remains hemodynamically stable. Marland Kitchen  CRITICAL CARE Performed by: Gwyneth Sprout Total critical care time: 30 Critical care time was exclusive of separately billable procedures and treating other patients. Critical care was necessary to treat or prevent imminent or life-threatening deterioration. Critical care was time spent personally by me on the following activities: development of treatment plan with patient and/or surrogate as well as nursing, discussions with consultants, evaluation of patient's response to treatment, examination of patient, obtaining history from patient or surrogate, ordering and performing treatments and interventions, ordering and review of laboratory studies, ordering and review of radiographic studies, pulse oximetry and re-evaluation of patient's condition.   Gwyneth Sprout, MD 05/28/13 2041

## 2013-05-28 NOTE — ED Notes (Signed)
Pt here for sickle cell pain pain mostly in his back

## 2013-05-28 NOTE — ED Notes (Signed)
Pt"s blood will not be available tonight due rare antibodies per blood bank Dr Elvera Lennox aware

## 2013-05-28 NOTE — H&P (Signed)
[facility] History and Physical  Cosby Proby ZOX:[id number] DOB: 08-14-1986 DOA: 05/28/2013  Referring physician: Dr. Anitra Lauth PCP: No primary provider on file.  Specialists: none  Chief Complaint: back pain  HPI: Dennis Bernard is a 27 y.o. male has a past medical history significant for sickle cell disease, recently hospitalized and discharged last week, presents to the emergency room with a chief complaint of back pain, which is similar to his previous crisis, unrelieved by home oral medications. He denies any chest pain or breathing difficulties. He felt generalized weakness for the past day. He denies headache, felt a bit lightheaded yesterday. He denies any abdominal pain, has mild nausea but denies vomiting. Denies any fever or chills at home. He denies any burning urination or dysuria. In the ED, his Hemoglobin was low at 3.7.  Review of Systems: as per HPI otherwise negative  Past Medical History  Diagnosis Date  . Sickle cell disease   . Avascular necrosis of femur head, right   . H/O allergic rhinitis   . H/O hypokalemia   . Chronic pain syndrome   . H/O wheezing     with colds   Past Surgical History  Procedure Laterality Date  . Cholecystectomy  1995  . Portacath placement Left 07/04/2012  . Exchange transfusion  02/2008    perioperatively for shoulder surgery  . 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 He has never used smokeless tobacco. He reports that he does not drink alcohol or use illicit drugs.  No Known Allergies  Family History  Problem Relation Age of Onset  . Sickle cell anemia Brother   . Sickle cell trait Father   . Sickle cell trait Mother   . Diabetes Mellitus II Mother   . Sickle cell anemia Paternal Uncle   . Asthma Neg Hx   . Allergic rhinitis Neg Hx      Prior to Admission medications   Medication Sig Start Date End Date Taking? Authorizing Provider  albuterol  (PROVENTIL HFA;VENTOLIN HFA) 108 (90 BASE) MCG/ACT inhaler Inhale 1 puff into the lungs every 6 (six) hours as needed for wheezing or shortness of breath. wheezing 11/27/12  Yes Vassie Loll, MD  diphenhydrAMINE (BENADRYL) 25 MG tablet Take 25 mg by mouth every 8 (eight) hours as needed for itching.  08/23/12  Yes Dow Adolph, MD  fluticasone [facility]) 50 MCG/ACT nasal spray Place 1 spray into the nose daily. 05/19/13  Yes Kathlen Mody, MD  folic acid (FOLVITE) 1 MG tablet Take 1 mg by mouth every morning. 11/27/12  Yes Vassie Loll, MD  gabapentin (NEURONTIN) 100 MG capsule Take 1 capsule (100 mg total) by mouth 2 (two) times daily. 05/19/13  Yes Kathlen Mody, MD  HYDROmorphone (DILAUDID) 4 MG tablet Take 1 tablet (4 mg total) by mouth every 4 (four) hours as needed for pain. 05/19/13  Yes Kathlen Mody, MD  hydroxyurea (HYDREA) 500 MG capsule Take 2,000 mg by mouth every morning. May take with food to minimize GI side effects. 11/27/12  Yes Vassie Loll, MD  morphine (MS CONTIN) 60 MG 12 hr tablet Take 1 tablet (60 mg total) by mouth 2 (two) times daily. 05/19/13  Yes Kathlen Mody, MD  promethazine (PHENERGAN) 25 MG tablet Take 1 tablet (25 mg total) by mouth every 6 (six) hours as needed for nausea. 12/12/12  Yes Alinda Money, MD  sennosides-docusate sodium (SENOKOT-S) 8.6-50 MG tablet Take 1 tablet by mouth 2 (two) times  daily. 02/14/13   Altha Harm, MD   Physical Exam: Filed Vitals:   05/28/13 1932 05/28/13 1933  BP:  127/81  Pulse:  114  Temp:  98.9 F (37.2 C)  TempSrc:  Oral  Resp:  16  SpO2: 100% 100%     General:  NAD  Eyes: PERRL, EOMI, no scleral icterus  ENT: moist oropharynx  Neck: supple, no JVD  Cardiovascular: regular rate without MRG; 2+ peripheral pulses  Respiratory: CTA biL, good air movement without wheezing, rhonchi or crackled  Abdomen: soft, non tender to palpation, positive bowel sounds, no guarding, no rebound  Skin: no rashes  Musculoskeletal: no  peripheral edema  Psychiatric: normal mood and affect  Neurologic: CN 2-12 grossly intact, MS 5/5 in all 4  Labs on Admission:  Basic Metabolic Panel:  Recent Labs Lab 05/28/13 2007  NA 133*  K 3.4*  CL 99  CO2 24  GLUCOSE 101*  BUN 19  CREATININE 1.13  CALCIUM 8.9   Liver Function Tests:  Recent Labs Lab 05/28/13 2007  AST 22  ALT 12  ALKPHOS 80  BILITOT 0.6  PROT 7.3  ALBUMIN 4.0   No results found for this basename: LIPASE, AMYLASE,  in the last 168 hours No results found for this basename: AMMONIA,  in the last 168 hours CBC:  Recent Labs Lab 05/28/13 2007  WBC 11.8*  NEUTROABS 8.1*  HGB 3.7*  HCT 10.8*  MCV 108.0*  PLT 433*   Cardiac Enzymes: No results found for this basename: CKTOTAL, CKMB, CKMBINDEX, TROPONINI,  in the last 168 hours  BNP (last 3 results) No results found for this basename: PROBNP,  in the last 8760 hours CBG: No results found for this basename: GLUCAP,  in the last 168 hours  Radiological Exams on Admission: Dg Chest Port 1 View  05/28/2013   CLINICAL DATA:  Sickle cell crisis. Weakness.  EXAM: PORTABLE CHEST - 1 VIEW  COMPARISON:  Chest x-ray 05/14/2013.  FINDINGS: Lordotic positioning. Right internal jugular central venous catheter with tip terminating in the proximal the mid superior vena cava, approximately 3 cm above the position noted on the prior examination. Lung volumes are low. No acute consolidative airspace disease. No pleural effusions. No evidence of pulmonary edema. Heart size is borderline enlarged. Upper mediastinal contours are unremarkable.  IMPRESSION: 1. Low lung volumes without radiographic evidence of acute cardiopulmonary disease.  2. Position of right internal jugular central venous catheter appears higher than the prior study. Although this may partially related to lordotic positioning on today's examination, clinical correlation to exclude catheter migration is recommended.   Electronically Signed   By:  Trudie Reed M.D.   On: 05/28/2013 20:21    EKG: Independently reviewed. Sinus tach.  Assessment/Plan Principal Problem:   Sickle cell pain crisis Active Problems:   Sickle cell anemia   Backache   Chronic pain   Other malaise and fatigue   Sickle cell pain crisis - Scheduled bolus doses of dilaudid 4 mg Q 2 hours while in acute crisis: - If patient requires more frequent doses of dilaudid, can switch to PCA. - Toradol IV for anti-inflammatory effect. Monitor renal function. - Monitor bilirubin/LDH Q 72 hours to monitor hemolysis. Current bilirubin: 0.6, LDH pending. - reticulocytes 16 - Continue IVF: 1/2 NS at 125cc/h. Volume status: dry. D/C IVF when hydrated. - K pad PRN. - Continue folic acid.  Acute on chronic anemia - Patient's baseline hemoglobin is: 7-8 - Patient's transfusion hemoglobin threshold is: <  6 - transfuse 2U pRBC as soon as blood ready. Patient with known antibodies.  DVT prophylaxis - heparin   Code Status: Full  Family Communication: none  Disposition Plan: inpatient  Time spent: 74  Camden Knotek M. Elvera Lennox, MD [facility] Pager [phone]  If 7PM-7AM, please contact night-coverage [email or web address] Password [facility] 05/28/2013, 9:38 PM

## 2013-05-29 LAB — CBC WITH DIFFERENTIAL/PLATELET
Band Neutrophils: 0 % (ref 0–10)
Blasts: 0 %
Lymphocytes Relative: 66 % — ABNORMAL HIGH (ref 12–46)
MCHC: 33.7 g/dL (ref 30.0–36.0)
MCV: 111 fL — ABNORMAL HIGH (ref 78.0–100.0)
Metamyelocytes Relative: 0 %
Monocytes Absolute: 0.7 10*3/uL (ref 0.1–1.0)
Monocytes Relative: 5 % (ref 3–12)
Platelets: 366 10*3/uL (ref 150–400)
Promyelocytes Absolute: 0 %
RDW: 20.4 % — ABNORMAL HIGH (ref 11.5–15.5)
WBC: 14.2 10*3/uL — ABNORMAL HIGH (ref 4.0–10.5)
nRBC: 8 /100 WBC — ABNORMAL HIGH

## 2013-05-29 LAB — LACTATE DEHYDROGENASE: LDH: 447 U/L — ABNORMAL HIGH (ref 94–250)

## 2013-05-29 MED ORDER — ONDANSETRON HCL 4 MG/2ML IJ SOLN: 4.0000 mg | Freq: Four times a day (QID) | INTRAMUSCULAR | Status: DC | PRN

## 2013-05-29 MED ORDER — DIPHENHYDRAMINE HCL 50 MG/ML IJ SOLN: 12.5000 mg | Freq: Four times a day (QID) | INTRAMUSCULAR | Status: DC | PRN

## 2013-05-29 MED ORDER — POTASSIUM CHLORIDE IN NACL 20-0.45 MEQ/L-% IV SOLN
INTRAVENOUS | Status: DC
  Administered 2013-05-29: 75 mL/h via INTRAVENOUS
  Administered 2013-05-29: 22:00:00 via INTRAVENOUS
  Administered 2013-05-30 (×2): 75 mL/h via INTRAVENOUS
  Administered 2013-05-31 – 2013-06-01 (×3): via INTRAVENOUS
  Administered 2013-06-02: 75 mL/h via INTRAVENOUS
  Administered 2013-06-02 – 2013-06-03 (×2): via INTRAVENOUS
  Filled 2013-05-29 (×12): qty 1000

## 2013-05-29 MED ORDER — MORPHINE SULFATE ER 30 MG PO TBCR
60.0000 mg | EXTENDED_RELEASE_TABLET | Freq: Two times a day (BID) | ORAL | Status: DC
  Administered 2013-05-29 – 2013-06-05 (×15): 60 mg via ORAL
  Filled 2013-05-29 (×15): qty 2

## 2013-05-29 MED ORDER — SODIUM CHLORIDE 0.9 % IJ SOLN
10.0000 mL | Freq: Two times a day (BID) | INTRAMUSCULAR | Status: DC
  Administered 2013-06-02: 23:00:00 10 mL
  Administered 2013-06-03: 10:00:00 40 mL

## 2013-05-29 MED ORDER — SODIUM CHLORIDE 0.9 % IJ SOLN: 9.0000 mL | INTRAMUSCULAR | Status: DC | PRN

## 2013-05-29 MED ORDER — DIPHENHYDRAMINE HCL 25 MG PO CAPS
25.0000 mg | ORAL_CAPSULE | ORAL | Status: DC | PRN
  Administered 2013-06-03 – 2013-06-04 (×5): 50 mg via ORAL
  Administered 2013-06-04: 21:00:00 25 mg via ORAL
  Administered 2013-06-04: 12:00:00 50 mg via ORAL
  Administered 2013-06-05: 25 mg via ORAL
  Administered 2013-06-05: 08:00:00 50 mg via ORAL
  Filled 2013-05-29: qty 2
  Filled 2013-05-29: qty 1
  Filled 2013-05-29: qty 2
  Filled 2013-05-29 (×4): qty 1
  Filled 2013-05-29 (×4): qty 2
  Filled 2013-05-29: qty 1
  Filled 2013-05-29: qty 2

## 2013-05-29 MED ORDER — SODIUM CHLORIDE 0.9 % IJ SOLN: 10.0000 mL | INTRAMUSCULAR | Status: DC | PRN

## 2013-05-29 MED ORDER — GABAPENTIN 100 MG PO CAPS
100.0000 mg | ORAL_CAPSULE | Freq: Two times a day (BID) | ORAL | Status: DC
  Administered 2013-05-29 – 2013-06-05 (×15): 100 mg via ORAL
  Filled 2013-05-29 (×16): qty 1

## 2013-05-29 MED ORDER — DIPHENHYDRAMINE HCL 12.5 MG/5ML PO ELIX: 12.5000 mg | ORAL_SOLUTION | Freq: Four times a day (QID) | ORAL | Status: DC | PRN

## 2013-05-29 MED ORDER — VITAMINS A & D EX OINT
TOPICAL_OINTMENT | CUTANEOUS | Status: AC
  Filled 2013-05-29: qty 5

## 2013-05-29 MED ORDER — HYDROMORPHONE 0.3 MG/ML IV SOLN
INTRAVENOUS | Status: DC
  Administered 2013-05-29: 16:00:00 6.86 mg via INTRAVENOUS
  Administered 2013-05-29: 4 mg via INTRAVENOUS
  Administered 2013-05-29: 9.62 mg via INTRAVENOUS
  Administered 2013-05-29: 19:00:00 6.99 mg via INTRAVENOUS
  Administered 2013-05-30: 21:00:00 7.47 mg via INTRAVENOUS
  Administered 2013-05-30: 12:00:00 7.63 mg via INTRAVENOUS
  Administered 2013-05-30: 6.99 mg via INTRAVENOUS
  Administered 2013-05-30: 16:00:00 7.63 mg via INTRAVENOUS
  Administered 2013-05-30: 08:00:00 6.99 mg via INTRAVENOUS
  Administered 2013-05-30: 05:00:00 5.64 mg via INTRAVENOUS
  Administered 2013-05-31: 7.5 mg via INTRAVENOUS
  Administered 2013-05-31: 4.99 mg via INTRAVENOUS
  Administered 2013-05-31: 8.77 mg via INTRAVENOUS
  Administered 2013-05-31: 09:00:00 via INTRAVENOUS
  Filled 2013-05-29 (×12): qty 25

## 2013-05-29 MED ORDER — DIPHENHYDRAMINE HCL 50 MG/ML IJ SOLN
50.0000 mg | INTRAMUSCULAR | Status: DC | PRN
  Administered 2013-05-29 – 2013-06-04 (×27): 50 mg via INTRAVENOUS
  Filled 2013-05-29 (×27): qty 1

## 2013-05-29 MED ORDER — NALOXONE HCL 0.4 MG/ML IJ SOLN: 0.4000 mg | INTRAMUSCULAR | Status: DC | PRN

## 2013-05-29 NOTE — Progress Notes (Signed)
Phone consultation for advice on this man with sickle cell disease admitted yesterday with a typical crisis characterized by low back pain and found to have a hemoglobin of 3.7 which has fallen to 3.4 overnight. 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 It has been extremely difficult to get crossmatched units for him. He has had a complete evaluation at [facility] including molecular studies. He last received blood here on September 2. Blood was crossmatched for the major antigens that he reacts against but big I units could not be found. He had no immediate reaction to the blood. Post transfusion hemoglobin on September 3 rose from 4.8 g up to 7.1 g. 8.2 by time of discharge on September 4. A direct Coombs test today is negative. Serum LDH 578 on admission September 14 and 447 today. At time of transfusion on September 2 value was 201. Previous baseline  293 recorded on July 14 and 292 on July 12. Presumably he received blood on July 12 as well. Total bilirubin is 0.6 on current admission Uncorrected Reticulocyte count 16.4% compared with 8.8% on August 31, 15.3% on July 10, 10% on June 7, 5.6% on June 3. BUN 19, creatinine 1.1 both normal and at his baseline.  Impression: Although it is not possible to say conclusively, it does not appear that he is experiencing a delayed hemolytic transfusion reaction given the negative direct Coombs test,  normal bilirubin and the fact that he had a respectable rise in hemoglobin following the transfusion given on September 2.. LDH is elevated but not to an extreme degree.  Recommendation: I would proceed with transfusion of 2 units of least incompatible blood crossmatched for the major antigens against which he has reacted in the past. Consider a short  course of parenteral steroids.  Consider discussion and transfer to [facility] where the majority of his blood bank evaluation has been done in the past.  Discussed above with our blood bank technicians, our supervising pathologist, and the hospitalist attending physician.  Please call for any questions or concerns [phone]

## 2013-05-29 NOTE — Progress Notes (Signed)
Utilization review completed.  

## 2013-05-29 NOTE — Progress Notes (Signed)
[facility] PROGRESS NOTE  Dennis Bernard ZOX:[id number] DOB: [birth year] DOA: 05/28/2013 PCP: No primary provider on file.  Brief narrative: Dennis Bernard is an 28 y.o. male with a PMH of sickle cell disease with multiple hospitalizations who was admitted to the hospital on 05/28/2013 with chief complaint of back pain similar to previous vaso-occlusive crisis. Upon initial evaluation emergency department, his hemoglobin was 3.7 mg/dL.  Assessment/Plan: Principal Problem: Sickle cell pain crisis with backache, chronic pain and fatigue -Pain management: Continue long acting medications at usual home dose: MS Contin 60 mg every 12 hours.  Avoid increasing dose further to prevent further issues with tolerance. -As needed bolus doses of dilaudid 4 mg Q 2 hours while in acute crisis has not been effective so will place on PCA Dilaudid-HP for better pain control.  -Toradol IV Q 6 hours for anti-inflammatory effect.  Monitor renal function. -Adjuvant pain therapy: Neurontin 100 mg twice a day. -Wean IV narcotics when pain rated 7/10.  Today the patient's pain is a level 9,  (when weaning, decrease dose first, then frequency). -When able to tolerate oral therapy, convert at 50-75% of IV dose & give PRNs Q 2-3 hours. -Monitor CBC (add differential if on Hydrea) Q day until stable. -Monitor bilirubin/LDH Q 72 hours to monitor hemolysis.  Current bilirubin: 0.6, LDH 578. -Reticulocyte count 16.4%, no evidence of aplastic anemia. Coombs test not indicative of delayed transfusion reaction. -Ferritin level last checked 03/23/2013, and was 618.  No indication for Desferal. -On Hydrea routinely. Will resume after blood transfusion. -Continue IVF: 1/2 NS at 75 cc an hour.  Volume status: Mildly dehydrated.  D/C IVF when hydrated. -K pad PRN. -Continue folic acid. Active problems: Hypokalemia -Add potassium to IV fluids. Constipation induced by narcotics -MiraLAX twice a day. -Increase  mobilization as tolerated. Chronic anemia -Patient's baseline hemoglobin is: 7-8 mg/dL. -Patient's transfusion hemoglobin threshold is: Less than 6 mg/dL. -Current hemoglobin 3.4 mg/dL. For 2 units of packed red blood cells today. Has multiple antibodies which have not allowed timely administration of blood products. Will use the least incompatible blood available.  Code Status: Full. Family Communication: No family at bedside. Disposition Plan: Home when stable.   Medical Consultants:  None.  Other Consultants:  None.  Anti-infectives:  None.  HPI/Subjective: Dennis Bernard is weak and complains of level IX pain all over consistent with his usual sickle cell pain. Denies dyspnea and chest pain at present.  Objective: Filed Vitals:   05/28/13 1932 05/28/13 1933 05/28/13 2324 05/29/13 0530  BP:  127/81 116/68 117/56  Pulse:  114 91 73  Temp:  98.9 F (37.2 C) 98.5 F (36.9 C) 97.7 F (36.5 C)  TempSrc:  Oral Oral Oral  Resp:  16 18 18   Height:   5\' 9"  (1.753 m)   Weight:   76.6 kg (168 lb 14 oz)   SpO2: 100% 100% 100% 100%    Intake/Output Summary (Last 24 hours) at 05/29/13 0809 Last data filed at 05/29/13 0630  Gross per 24 hour  Intake 1555.42 ml  Output      0 ml  Net 1555.42 ml    Exam: Gen:  NAD, pale Cardiovascular:  RRR, No M/R/G Respiratory:  Lungs CTAB Gastrointestinal:  Abdomen soft, NT/ND, + BS Extremities:  No C/E/C  Data Reviewed: Basic Metabolic Panel:  Recent Labs Lab 05/28/13 2007  NA 133*  K 3.4*  CL 99  CO2 24  GLUCOSE 101*  BUN 19  CREATININE 1.13  CALCIUM 8.9  GFR Estimated Creatinine Clearance: 97.3 ml/min (by C-G formula based on Cr of 1.13). Liver Function Tests:  Recent Labs Lab 05/28/13 2007  AST 22  ALT 12  ALKPHOS 80  BILITOT 0.6  PROT 7.3  ALBUMIN 4.0   CBC:  Recent Labs Lab 05/28/13 2007 05/29/13 0612  WBC 11.8* 14.2*  NEUTROABS 8.1* 0.0*  HGB 3.7* 3.4*  HCT 10.8* 10.1*  MCV 108.0* 111.0*   PLT 433* 366   Anemia work up  Recent Labs  05/28/13 2007  RETICCTPCT 16.4*   Microbiology No results found for this or any previous visit (from the past 240 hour(s)).   Procedures and Diagnostic Studies:  Dg Chest Port 1 View 05/28/2013   CLINICAL DATA:  Sickle cell crisis. Weakness.  EXAM: PORTABLE CHEST - 1 VIEW  COMPARISON:  Chest x-ray 05/14/2013.  FINDINGS: Lordotic positioning. Right internal jugular central venous catheter with tip terminating in the proximal the mid superior vena cava, approximately 3 cm above the position noted on the prior examination. Lung volumes are low. No acute consolidative airspace disease. No pleural effusions. No evidence of pulmonary edema. Heart size is borderline enlarged. Upper mediastinal contours are unremarkable.  IMPRESSION: 1. Low lung volumes without radiographic evidence of acute cardiopulmonary disease.  2. Position of right internal jugular central venous catheter appears higher than the prior study. Although this may partially related to lordotic positioning on today's examination, clinical correlation to exclude catheter migration is recommended.   Electronically Signed   By: Trudie Reed M.D.   On: 05/28/2013 20:21    Scheduled Meds: . folic acid  1 mg Oral Daily  . heparin  5,000 Units Subcutaneous Q8H  . ketorolac  30 mg Intravenous Q6H  . polyethylene glycol  17 g Oral BID  . vitamin A & D       Continuous Infusions: . sodium chloride 125 mL/hr at 05/29/13 0747    Time spent: 35 minutes with > 50% of time discussing current diagnostic test results, clinical impression and plan of care.    LOS: 1 day   Dennis Bernard  [facility] Pager [phone].   *Please note that the hospitalists switch teams on Wednesdays. Please call the flow manager at [phone] if you are having difficulty reaching the hospitalist taking care of this patient as she can update you and provide the most up-to-date pager number of provider  caring for the patient. If 8PM-8AM, please contact night-coverage at [email or web address], password [facility]  05/29/2013, 8:09 AM

## 2013-05-29 NOTE — Progress Notes (Signed)
Nurse just received call from Lab that blood was ready for pt and this was the second call.  First one at 1928 this evening.  Pt has only one lumen to right chest PICC.  Complains that he is a hard stick.  Will call IV team and get periperal IV to begin blood, as PICC is connected to PCA.

## 2013-05-30 ENCOUNTER — Encounter (HOSPITAL_COMMUNITY): Payer: Self-pay | Admitting: Oncology

## 2013-05-30 ENCOUNTER — Inpatient Hospital Stay: Payer: Medicare Other | Admitting: Internal Medicine

## 2013-05-30 DIAGNOSIS — T80A0XA Non-ABO incompatibility reaction due to transfusion of blood or blood products, unspecified, initial encounter: Secondary | ICD-10-CM

## 2013-05-30 HISTORY — DX: Non-abo incompatibility reaction due to transfusion of blood or blood products, unspecified, initial encounter: T80.A0XA

## 2013-05-30 LAB — COMPREHENSIVE METABOLIC PANEL
ALT: 11 U/L (ref 0–53)
Albumin: 3.4 g/dL — ABNORMAL LOW (ref 3.5–5.2)
Alkaline Phosphatase: 71 U/L (ref 39–117)
BUN: 14 mg/dL (ref 6–23)
Chloride: 107 mEq/L (ref 96–112)
GFR calc Af Amer: >90 mL/min (ref >90)
Glucose, Bld: 93 mg/dL (ref 70–99)
Potassium: 4.3 mEq/L (ref 3.5–5.1)
Sodium: 138 mEq/L (ref 135–145)
Total Bilirubin: 3.1 mg/dL — ABNORMAL HIGH (ref 0.3–1.2)
Total Protein: 6.3 g/dL (ref 6.0–8.3)

## 2013-05-30 LAB — CBC
HCT: 15.7 % — ABNORMAL LOW (ref 39.0–52.0)
Hemoglobin: 5.2 g/dL — CL (ref 13.0–17.0)
MCHC: 33.1 g/dL (ref 30.0–36.0)
RBC: 1.53 MIL/uL — ABNORMAL LOW (ref 4.22–5.81)
WBC: 13.2 10*3/uL — ABNORMAL HIGH (ref 4.0–10.5)

## 2013-05-30 MED ORDER — SODIUM CHLORIDE 0.9 % IJ SOLN
10.0000 mL | INTRAMUSCULAR | Status: DC | PRN
  Administered 2013-05-31 – 2013-06-05 (×7): 10 mL

## 2013-05-30 MED ORDER — HYDROXYUREA 500 MG PO CAPS: 2000.0000 mg | ORAL_CAPSULE | Freq: Every morning | ORAL | Status: DC

## 2013-05-30 NOTE — Progress Notes (Addendum)
[facility] PROGRESS NOTE  Dennis Bernard:[id number] DOB: 02/16/1986 DOA: 05/28/2013 PCP: No primary provider on file.  Brief narrative: Dennis Bernard is an 27 y.o. male with a PMH of sickle cell disease with multiple hospitalizations who was admitted to the hospital on 05/28/2013 with chief complaint of back pain similar to previous vaso-occlusive crisis. Upon initial evaluation emergency department, his hemoglobin was 3.7 mg/dL.  Although he has received care at [facility] and [facility], he wishes to remain here for his care.  Assessment/Plan: Principal Problem: Sickle cell pain crisis with backache, chronic pain and fatigue -Pain management: Continue long acting medications at usual home dose: MS Contin 60 mg every 12 hours.  Avoid increasing dose further to prevent further issues with tolerance. -Continue PCA Dilaudid-HP for better pain control.  -Toradol IV Q 6 hours for anti-inflammatory effect.  Monitor renal function. -Adjuvant pain therapy: Neurontin 100 mg twice a day. -Wean IV narcotics when pain rated 7/10.  Today the patient's pain is a level 8,  (when weaning, decrease dose first, then frequency). -When able to tolerate oral therapy, convert at 50-75% of IV dose & give PRNs Q 2-3 hours. -Monitor CBC (add differential if on Hydrea) Q day until stable. -Monitor bilirubin/LDH Q 72 hours to monitor hemolysis.  Current bilirubin: 0.6, LDH 578. -Reticulocyte count 16.4%, no evidence of aplastic anemia. Coombs test not indicative of delayed transfusion reaction. -Ferritin level last checked 03/23/2013, and was 618.  No indication for Desferal. -On Hydrea routinely. Will resume. -Continue IVF: 1/2 NS at 75 cc an hour.  Volume status: Mildly dehydrated.  D/C IVF when hydrated. -K pad PRN. -Continue folic acid. Active problems: Hypokalemia -Corrected. Constipation induced by narcotics -MiraLAX twice a day. -Increase mobilization as tolerated. Chronic anemia -Patient's baseline  hemoglobin is: 7-8 mg/dL. -Patient's transfusion hemoglobin threshold is: Less than 6 mg/dL, but will only transfuse for a hemoglobin of< 5 mg/dL per hematology recommendations. -Current hemoglobin 5.2 mg/dL status post 2 units of packed red blood cells 05/29/13. Has multiple antibodies which have not allowed timely administration of blood products. Given the least incompatible blood available.  Code Status: Full. Family Communication: No family at bedside. Disposition Plan: Home when stable.   Medical Consultants:  None.  Other Consultants:  None.  Anti-infectives:  None.  HPI/Subjective: Dennis Bernard is weak and complains of level 5-8/10 pain all over consistent with his usual sickle cell pain, but has periods where his pain is better controlled. Denies dyspnea and chest pain at present.  Objective: Filed Vitals:   05/30/13 0800 05/30/13 0813 05/30/13 1205 05/30/13 1459  BP:    107/70  Pulse:    90  Temp:    98.7 F (37.1 C)  TempSrc:    Oral  Resp: 14 14 14 18   Height:      Weight:      SpO2: 100% 100% 100% 100%    Intake/Output Summary (Last 24 hours) at 05/30/13 1534 Last data filed at 05/30/13 1500  Gross per 24 hour  Intake   3282 ml  Output   1800 ml  Net   1482 ml    Exam: Gen:  NAD, pale Cardiovascular:  RRR, No M/R/G Respiratory:  Lungs CTAB Gastrointestinal:  Abdomen soft, NT/ND, + BS Extremities:  No C/E/C  Data Reviewed: Basic Metabolic Panel:  Recent Labs Lab 05/28/13 2007 05/30/13 0540  NA 133* 138  K 3.4* 4.3  CL 99 107  CO2 24 23  GLUCOSE 101* 93  BUN 19 14  CREATININE 1.13 1.07  CALCIUM 8.9 8.8   GFR Estimated Creatinine Clearance: 102.8 ml/min (by C-G formula based on Cr of 1.07). Liver Function Tests:  Recent Labs Lab 05/28/13 2007 05/30/13 0540  AST 22 22  ALT 12 11  ALKPHOS 80 71  BILITOT 0.6 3.1*  PROT 7.3 6.3  ALBUMIN 4.0 3.4*   CBC:  Recent Labs Lab 05/28/13 2007 05/29/13 0612 05/30/13 0540  WBC  11.8* 14.2* 13.2*  NEUTROABS 8.1* 4.1  --   HGB 3.7* 3.4* 5.2*  HCT 10.8* 10.1* 15.7*  MCV 108.0* 111.0* 102.6*  PLT 433* 366 344   Anemia work up  Recent Labs  05/28/13 2007  RETICCTPCT 16.4*   Microbiology No results found for this or any previous visit (from the past 240 hour(s)).   Procedures and Diagnostic Studies:  Dg Chest Port 1 View 05/28/2013   CLINICAL DATA:  Sickle cell crisis. Weakness.  EXAM: PORTABLE CHEST - 1 VIEW  COMPARISON:  Chest x-ray 05/14/2013.  FINDINGS: Lordotic positioning. Right internal jugular central venous catheter with tip terminating in the proximal the mid superior vena cava, approximately 3 cm above the position noted on the prior examination. Lung volumes are low. No acute consolidative airspace disease. No pleural effusions. No evidence of pulmonary edema. Heart size is borderline enlarged. Upper mediastinal contours are unremarkable.  IMPRESSION: 1. Low lung volumes without radiographic evidence of acute cardiopulmonary disease.  2. Position of right internal jugular central venous catheter appears higher than the prior study. Although this may partially related to lordotic positioning on today's examination, clinical correlation to exclude catheter migration is recommended.   Electronically Signed   By: Trudie Reed M.D.   On: 05/28/2013 20:21    Scheduled Meds: . folic acid  1 mg Oral Daily  . gabapentin  100 mg Oral BID  . HYDROmorphone PCA 0.3 mg/mL   Intravenous Q4H  . ketorolac  30 mg Intravenous Q6H  . morphine  60 mg Oral BID  . polyethylene glycol  17 g Oral BID  . sodium chloride  10-40 mL Intracatheter Q12H   Continuous Infusions: . 0.45 % NaCl with KCl 20 mEq / L 75 mL/hr (05/30/13 0902)    Time spent: 25 minutes.    LOS: 2 days   Zaylah Blecha  [facility] Pager [phone].   *Please note that the hospitalists switch teams on Wednesdays. Please call the flow manager at [phone] if you are having difficulty  reaching the hospitalist taking care of this patient as she can update you and provide the most up-to-date pager number of provider caring for the patient. If 8PM-8AM, please contact night-coverage at [email or web address], password [facility]  05/30/2013, 3:34 PM

## 2013-05-30 NOTE — Care Management Note (Addendum)
    Page 1 of 1   06/05/2013     12:23:01 PM   CARE MANAGEMENT NOTE 06/05/2013  Patient:  Dennis Bernard, Dennis Bernard   Account Number:  [id number]  Date Initiated:  05/29/2013  Documentation initiated by:  Colleen Can  Subjective/Objective Assessment:   DX SICKLE CELL CRISIS, HYPOKALEMIA, CHRONIC ANEMIA     Action/Plan:   fROM HOME/WILL RETURN HOME   Anticipated DC Date:  06/05/2013   Anticipated DC Plan:  HOME/SELF CARE      DC Planning Services  CM consult      Choice offered to / List presented to:             Status of service:  Completed, signed off Medicare Important Message given?   (If response is "NO", the following Medicare IM given date fields will be blank) Date Medicare IM given:   Date Additional Medicare IM given:    Discharge Disposition:  HOME/SELF CARE  Per UR Regulation:  Reviewed for med. necessity/level of care/duration of stay  If discussed at Long Length of Stay Meetings, dates discussed:    Comments:  06/05/13 Nyeisha Goodall RN,BSN NCM [phone] PCP APPT ALREADY SET.ALSO CONFIRMED CBC ON WED [facility] ALREADY SET BY [facility].SEE ALL F/U APPTS IN F/U SECTION OF D/C INSTRUCTIONS.NO FURTHER D/C NEEDS OR ORDERS.  05/31/13 Jerome Viglione RN,BSN NCM [phone] TRANSITIONING DILAUDID IV INTERMITTENT TO PO,IV TORADOL,IVF @ 75.D/CPLAN HOME.PCP F/U  APPT ALREADY IN D/C F/U SECTION.NO FURTHER D/C NEEDS.

## 2013-05-30 NOTE — Progress Notes (Signed)
He tolerated transfusion with 2 units of least incompatible red blood cells given yesterday. Reasonable rise in hemoglobin from 3.4 to 5.2. Bilirubin is up today from 0.6 to 3.1 but LDH is slightly lower down from 578 to 447. I discussed with the pathologist supervising the blood bank. He did have a microscopic positive direct Coombs today but no major antibody in the eluate. No evidence that he is having a significant delayed hemolytic transfusion reaction at this time. I am quite surprised that any person with sickle cell disease would have a normal baseline bilirubin to begin with. A bilirubin of 3 is the usual baseline for most patients. He did develop some intermittent retrosternal pressure following the transfusion. Of interest, he had no chest symptoms prior to transfusion with a hemoglobin of 3.4! I reviewed the lab data and interpretation with the patient at the bedside today. Recommendation: Continue to follow standard parameters of hemolysis: Hemoglobin, LDH, and bilirubin. Additional transfusion only if hemoglobin falls below 5 g. [place] [facility] bank is working on getting big I. negative units since this seems to be the limiting antigen against which he reacts.

## 2013-05-30 NOTE — Progress Notes (Signed)
Rounding on patient and noticed he had increased the IVF to 999 ml/hr. I asked," what are you doing and why did you increase your fluids to this rate?" Explained to him the risk of manipulating with his IV lines. Told him that he is not to touch the IV lines or change the rate of infusion at any time. I told him when he give his PCA IV Dilaudid pain med do not UNLOCK the machine again to give himself a rapid infusion of the pain med. He stated, that was not his intention. He apologized and stated he will not do this again. I will continue to monitor. SRP, RN.

## 2013-05-30 NOTE — Progress Notes (Signed)
Received text that pt has converted to 1 degree HB on telemetry.  Pt receiving blood and is asleep.  No s/s of distress noted. Monitoring to continue.

## 2013-05-30 NOTE — Progress Notes (Signed)
Went in patient room for hourly rounds, saw that patient disconnected himself from IV pump, noted that patient has R chest PICC. Patient was educated for  the risk of blood stream  Infections, i explained it to him  that even [place] Rn's on the floor cannot just  disconnect  PICC lines, only the certified IV Rn's. . 5 mins after I went back to his room and he did the same, disconnected himself from the PICC. Again patient was re educated  For being high risk of catheter associated blood stream infection and to call if he needs any assistance. Patient just said " Oh I was about to move the pole on the other side of the bed. Misty Stanley IV Rn made aware.

## 2013-05-31 DIAGNOSIS — R5381 Other malaise: Secondary | ICD-10-CM

## 2013-05-31 DIAGNOSIS — Z5189 Encounter for other specified aftercare: Secondary | ICD-10-CM

## 2013-05-31 DIAGNOSIS — F1920 Other psychoactive substance dependence, uncomplicated: Secondary | ICD-10-CM

## 2013-05-31 LAB — CBC
HCT: 15.5 % — ABNORMAL LOW (ref 39.0–52.0)
MCHC: 32.9 g/dL (ref 30.0–36.0)
MCV: 109.9 fL — ABNORMAL HIGH (ref 78.0–100.0)
Platelets: 304 10*3/uL (ref 150–400)
RDW: 28.2 % — ABNORMAL HIGH (ref 11.5–15.5)

## 2013-05-31 LAB — TYPE AND SCREEN
DAT, IgG: NEGATIVE
PT AG Type: NEGATIVE
Unit division: 0
Unit division: 0

## 2013-05-31 MED ORDER — ALBUTEROL SULFATE HFA 108 (90 BASE) MCG/ACT IN AERS
1.0000 | INHALATION_SPRAY | Freq: Four times a day (QID) | RESPIRATORY_TRACT | Status: DC | PRN
  Filled 2013-05-31: qty 6.7

## 2013-05-31 MED ORDER — SENNA 8.6 MG PO TABS
2.0000 | ORAL_TABLET | Freq: Every day | ORAL | Status: DC
  Administered 2013-05-31 – 2013-06-04 (×5): 17.2 mg via ORAL
  Filled 2013-05-31 (×6): qty 2

## 2013-05-31 MED ORDER — HYDROMORPHONE HCL PF 2 MG/ML IJ SOLN
3.0000 mg | INTRAMUSCULAR | Status: DC | PRN
  Administered 2013-05-31 – 2013-06-01 (×9): 3 mg via INTRAVENOUS
  Filled 2013-05-31 (×9): qty 2

## 2013-05-31 NOTE — Progress Notes (Signed)
[facility] PROGRESS NOTE  Dennis Bernard KGM:[id number] DOB: [birth year] DOA: 05/28/2013 PCP: No primary provider on file.  Assessment/Plan: Sickle cell pain crisis with backache, chronic pain and fatigue  -appreciate Dr. Cyndie Chime followup  -Pain management: Continue long acting medications at usual home dose: MS Contin 60 mg every 12 hours. Avoid increasing dose further to prevent further issues with tolerance.  -discontinue PCA Dilaudid today (05/31/2013) - Start intermittent dosing IV hydromorphone 3mg  every 2 hours (patient has been averaging 1.25-1.5 mg every hour) - This was discussed with the patient and he is agreeable to the adjustment -Toradol IV Q 6 hours for anti-inflammatory effect. Monitor renal function.  -Adjuvant pain therapy: Neurontin 100 mg twice a day.  -Today the patient's pain is a level 7, (when weaning, decrease dose first, then frequency).  -When able to tolerate oral therapy, convert at 50-75% of IV dose & give PRNs Q 2-3 hours.  -Monitor CBC (add differential if on Hydrea) Q day until stable.  -Monitor bilirubin/LDH Q 72 hours to monitor hemolysis. Current bilirubin: 0.6, LDH 578--recheck on 06/01/13 -Reticulocyte count 16.4% at time of admission, no evidence of aplastic anemia. Coombs test not indicative of delayed transfusion reaction.  -Ferritin level last checked 03/23/2013, and was 618. No indication for Desferal.  -On Hydrea routinely. Will resume.  -Continue IVF: 1/2 NS at 75 cc an hour. Volume status: Mildly dehydrated. D/C IVF when hydrated.  -K pad PRN.  -Continue folic acid.  Active problems:  Hypokalemia  -Corrected.  Constipation induced by narcotics  -MiraLAX twice a day.  -Increase mobilization as tolerated.  -add senakot Chronic anemia  -Patient's baseline hemoglobin is: 7-8 mg/dL.  -Patient's transfusion hemoglobin threshold is: Less than 6 mg/dL, but will only transfuse for a hemoglobin of< 5 mg/dL per hematology  recommendations.  -Current hemoglobin 5.2 mg/dL status post 2 units of packed red blood cells 05/29/13. Has multiple antibodies which have not allowed timely administration of blood products. Given the least incompatible blood available.   Family Communication:   Pt at beside Disposition Plan:   Home when medically stable       Procedures/Studies: Dg Chest 1 View  05/14/2013   *RADIOLOGY REPORT*  Clinical Data: Evaluate line placement  CHEST - 1 VIEW  Comparison: Prior radiograph from 02/18/2013  Findings: There has been interval placement of a right IJ power line with tip located in the distal SVC.  Previously seen left- sided central venous port has been removed.  Cardiac and mediastinal silhouettes are unchanged.  The lungs are hypoinflated.  Mild diffuse prominence of the bronchovascular markings may be secondary to hypoinflation.  No definite focal infiltrate identified.  No pleural effusion or pulmonary edema.  No pneumothorax.  Left shoulder arthroplasty again noted.  IMPRESSION:  Tip of right IJ power line in the distal SVC.  No pneumothorax.   Original Report Authenticated By: Rise Mu, M.D.   Dg Cervical Spine 2 Or 3 Views  05/15/2013   *RADIOLOGY REPORT*  Clinical Data: Neck pain  CERVICAL SPINE - 2-3 VIEW  Comparison: None.  Findings: Seven cervical segments are well visualized.  Vertebral body height is well-maintained.  There are no changes to suggest acute fracture.  No soft tissue changes are seen.  The odontoid is within normal limits.  A right-sided PICC line is seen at the cavoatrial junction.  No other focal abnormality is noted.  IMPRESSION: No acute abnormalities seen.   Original Report Authenticated By: Alcide Clever, M.D.   Dg Lumbar Spine Complete  05/17/2013   *RADIOLOGY REPORT*  Clinical Data: Back pain for 4 days  LUMBAR SPINE - COMPLETE 4+ VIEW  Comparison: 08/26/2012  Findings: Normal alignment with no fracture.  Again noted is mild endplate sclerosis and  depression consistent with sickle cell change.  Osteosclerosis of the sacrum stable.  Sclerotic lesion right femoral head possibly avascular necrosis, stable.  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  No acute findings in the lumbar spine.   Original Report Authenticated By: Esperanza Heir, M.D.   Dg Chest Port 1 View  05/28/2013   CLINICAL DATA:  Sickle cell crisis. Weakness.  EXAM: PORTABLE CHEST - 1 VIEW  COMPARISON:  Chest x-ray 05/14/2013.  FINDINGS: Lordotic positioning. Right internal jugular central venous catheter with tip terminating in the proximal the mid superior vena cava, approximately 3 cm above the position noted on the prior examination. Lung volumes are low. No acute consolidative airspace disease. No pleural effusions. No evidence of pulmonary edema. Heart size is borderline enlarged. Upper mediastinal contours are unremarkable.  IMPRESSION: 1. Low lung volumes without radiographic evidence of acute cardiopulmonary disease.  2. Position of right internal jugular central venous catheter appears higher than the prior study. Although this may partially related to lordotic positioning on today's examination, clinical correlation to exclude catheter migration is recommended.   Electronically Signed   By: Trudie Reed M.D.   On: 05/28/2013 20:21         Subjective: Patient states that his pain is gradually improving. Today his pain level is 7/10. He denies any fevers, chills, headache, dizziness, chest pain, shortness of breath, nausea, vomiting, diarrhea, abdominal pain, dysuria. Back pain and leg pain are gradually improving.  Objective: Filed Vitals:   05/30/13 2349 05/31/13 0417 05/31/13 0631 05/31/13 0807  BP:   97/59   Pulse:   77   Temp:   98.3 F (36.8 C)   TempSrc:   Oral   Resp: 16 18 18 16   Height:      Weight:      SpO2:   98% 94%    Intake/Output Summary (Last 24 hours) at  05/31/13 1043 Last data filed at 05/31/13 0700  Gross per 24 hour  Intake   2195 ml  Output    600 ml  Net   1595 ml   Weight change:  Exam:   General:  Pt is alert, follows commands appropriately, not in acute distress  HEENT: No icterus, No thrush,  [place]/AT  Cardiovascular: RRR, S1/S2, no rubs, no gallops  Respiratory: Bibasilar crackles. Left clear to auscultation. No wheezing. Good air movement.  Abdomen: Soft/+BS, non tender, non distended, no guarding  Extremities: No edema, No lymphangitis, No petechiae, No rashes, no synovitis  Data Reviewed: Basic Metabolic Panel:  Recent Labs Lab 05/28/13 2007 05/30/13 0540  NA 133* 138  K 3.4* 4.3  CL 99 107  CO2 24 23  GLUCOSE 101* 93  BUN 19 14  CREATININE 1.13 1.07  CALCIUM 8.9 8.8   Liver Function Tests:  Recent Labs Lab 05/28/13 2007 05/30/13 0540  AST 22 22  ALT 12 11  ALKPHOS 80 71  BILITOT 0.6 3.1*  PROT 7.3 6.3  ALBUMIN 4.0 3.4*   No results found for this basename: LIPASE, AMYLASE,  in the last 168 hours No results found for this basename: AMMONIA,  in the last 168 hours CBC:  Recent Labs Lab 05/28/13 2007 05/29/13 0612 05/30/13 0540 05/31/13 0510  WBC 11.8* 14.2* 13.2* 7.6  NEUTROABS 8.1* 4.1  --   --   HGB 3.7* 3.4* 5.2* 5.1*  HCT 10.8* 10.1* 15.7* 15.5*  MCV 108.0* 111.0* 102.6* 109.9*  PLT 433* 366 344 304   Cardiac Enzymes: No results found for this basename: CKTOTAL, CKMB, CKMBINDEX, TROPONINI,  in the last 168 hours BNP: No components found with this basename: POCBNP,  CBG: No results found for this basename: GLUCAP,  in the last 168 hours  No results found for this or any previous visit (from the past 240 hour(s)).   Scheduled Meds: . folic acid  1 mg Oral Daily  . gabapentin  100 mg Oral BID  . ketorolac  30 mg Intravenous Q6H  . morphine  60 mg Oral BID  . polyethylene glycol  17 g Oral BID  . senna  2 tablet Oral Daily  . sodium chloride  10-40 mL Intracatheter Q12H    Continuous Infusions: . 0.45 % NaCl with KCl 20 mEq / L 75 mL/hr at 05/31/13 0420     Dennis Sexson, DO  [facility] Pager [phone]  If 7PM-7AM, please contact night-coverage [email or web address] Password [email or web address] 05/31/2013, 10:43 AM   LOS: 3 days

## 2013-05-31 NOTE — Progress Notes (Signed)
Hemoglobin holding at 5.1 >24 hours post transfusion of least incompatible blood.  LDH & bilirubin today pending. No sign of delayed hemolytic transfusion reaction at this time. Continue close observation. Minimize transfusions. See my previous notes.

## 2013-06-01 LAB — CBC
Platelets: 296 10*3/uL (ref 150–400)
RBC: 1.52 MIL/uL — ABNORMAL LOW (ref 4.22–5.81)
RDW: 24.1 % — ABNORMAL HIGH (ref 11.5–15.5)
WBC: 5.2 10*3/uL (ref 4.0–10.5)

## 2013-06-01 LAB — LACTATE DEHYDROGENASE: LDH: 337 U/L — ABNORMAL HIGH (ref 94–250)

## 2013-06-01 LAB — RETICULOCYTES
RBC.: 1.37 MIL/uL — ABNORMAL LOW (ref 4.22–5.81)
Retic Count, Absolute: 242.5 10*3/uL — ABNORMAL HIGH (ref 19.0–186.0)
Retic Ct Pct: 17.7 % — ABNORMAL HIGH (ref 0.4–3.1)

## 2013-06-01 LAB — COMPREHENSIVE METABOLIC PANEL
AST: 16 U/L (ref 0–37)
Albumin: 3.1 g/dL — ABNORMAL LOW (ref 3.5–5.2)
BUN: 10 mg/dL (ref 6–23)
Creatinine, Ser: 0.95 mg/dL (ref 0.50–1.35)
Total Protein: 6.2 g/dL (ref 6.0–8.3)

## 2013-06-01 MED ORDER — HYDROMORPHONE HCL 2 MG PO TABS
4.0000 mg | ORAL_TABLET | ORAL | Status: DC | PRN
  Administered 2013-06-01 – 2013-06-05 (×19): 4 mg via ORAL
  Filled 2013-06-01 (×20): qty 2

## 2013-06-01 MED ORDER — LACTULOSE 10 GM/15ML PO SOLN
20.0000 g | Freq: Once | ORAL | Status: AC
  Administered 2013-06-01: 13:00:00 20 g via ORAL
  Filled 2013-06-01: qty 30

## 2013-06-01 MED ORDER — HYDROMORPHONE HCL PF 2 MG/ML IJ SOLN: 2.0000 mg | INTRAMUSCULAR | Status: DC | PRN

## 2013-06-01 MED ORDER — HYDROMORPHONE HCL PF 2 MG/ML IJ SOLN
2.0000 mg | INTRAMUSCULAR | Status: DC | PRN
  Administered 2013-06-01 – 2013-06-02 (×5): 2 mg via INTRAVENOUS
  Filled 2013-06-01 (×5): qty 1

## 2013-06-01 NOTE — Progress Notes (Signed)
[facility] PROGRESS NOTE  Dennis Bernard ZOX:[id number] DOB: [birth year] DOA: 05/28/2013 PCP: No primary provider on file.  Assessment/Plan: Sickle cell pain crisis with backache, chronic pain and fatigue  -appreciate Dr. Cyndie Chime followup  -Pain management: Continue long acting medications at usual home dose: MS Contin 60 mg every 12 hours. Avoid increasing dose further to prevent further issues with tolerance.  -discontinue PCA Dilaudid today (05/31/2013)  - 05/31/13--Start intermittent dosing IV hydromorphone 3mg  every 2 hours (patient has been averaging 1.25-1.5 mg every hour) -pain remained stable on intermitten dosing of IV hydromorphone - 06/01/2013--start PO Dilaudid 4 mg every 4 hours with IV hydromorphone 2 mg IV every 3 hours when necessary breakthrough pain  - This was discussed with the patient and he is agreeable to the adjustment  -Toradol IV Q 6 hours for anti-inflammatory effect. Monitor renal function.  -Adjuvant pain therapy: Neurontin 100 mg twice a day.  -Today the patient's pain is a level 7 -Monitor CBC (add differential if on Hydrea) Q day until stable.  -Monitor bilirubin/LDH Q 72 hours to monitor hemolysis. Current bilirubin: 0.6, LDH 578--recheck on 06/01/13  - 06/01/2013 LDH 337, reticulocyte count 17.7, total bilirubin 1.2 -Reticulocyte count 16.4% at time of admission, no evidence of aplastic anemia. Coombs test not indicative of delayed transfusion reaction.  -Ferritin level last checked 03/23/2013, and was 618. No indication for Desferal.  -On Hydrea routinely. Will resume.  -Continue IVF: 1/2 NS at 75 cc an hour. Volume status: Mildly dehydrated. D/C IVF when hydrated.  -K pad PRN.  -Continue folic acid.  Active problems:  Hypokalemia  -Corrected.  Constipation induced by narcotics  -MiraLAX twice a day.  -Increase mobilization as tolerated.  -add senakot  - Add a dose of lactulose Chronic anemia  -Patient's baseline hemoglobin is: 7-8  mg/dL.  -Patient's transfusion hemoglobin threshold is: Less than 6 mg/dL, but will only transfuse for a hemoglobin of< 5 mg/dL per hematology recommendations.  -Current hemoglobin remained stable in 5 range status post 2 units of packed red blood cells 05/29/13. Has multiple antibodies which have not allowed timely administration of blood products. Given the least incompatible blood available.  Family Communication: Pt at beside  Disposition Plan: Home when medically stable      Procedures/Studies: Dg Chest 1 View  05/14/2013   *RADIOLOGY REPORT*  Clinical Data: Evaluate line placement  CHEST - 1 VIEW  Comparison: Prior radiograph from 02/18/2013  Findings: There has been interval placement of a right IJ power line with tip located in the distal SVC.  Previously seen left- sided central venous port has been removed.  Cardiac and mediastinal silhouettes are unchanged.  The lungs are hypoinflated.  Mild diffuse prominence of the bronchovascular markings may be secondary to hypoinflation.  No definite focal infiltrate identified.  No pleural effusion or pulmonary edema.  No pneumothorax.  Left shoulder arthroplasty again noted.  IMPRESSION:  Tip of right IJ power line in the distal SVC.  No pneumothorax.   Original Report Authenticated By: Rise Mu, M.D.   Dg Cervical Spine 2 Or 3 Views  05/15/2013   *RADIOLOGY REPORT*  Clinical Data: Neck pain  CERVICAL SPINE - 2-3 VIEW  Comparison: None.  Findings: Seven cervical segments are well visualized.  Vertebral body height is well-maintained.  There are no changes to suggest acute fracture.  No soft tissue changes are seen.  The odontoid is within normal limits.  A right-sided PICC line is seen at the cavoatrial junction.  No other focal abnormality  is noted.  IMPRESSION: No acute abnormalities seen.   Original Report Authenticated By: Alcide Clever, M.D.   Dg Lumbar Spine Complete  05/17/2013   *RADIOLOGY REPORT*  Clinical Data: Back pain for 4 days   LUMBAR SPINE - COMPLETE 4+ VIEW  Comparison: 08/26/2012  Findings: Normal alignment with no fracture.  Again noted is mild endplate sclerosis and depression consistent with sickle cell change.  Osteosclerosis of the sacrum stable.  Sclerotic lesion right femoral head possibly avascular necrosis, stable.  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  No acute findings in the lumbar spine.   Original Report Authenticated By: Esperanza Heir, M.D.   Dg Chest Port 1 View  05/28/2013   CLINICAL DATA:  Sickle cell crisis. Weakness.  EXAM: PORTABLE CHEST - 1 VIEW  COMPARISON:  Chest x-ray 05/14/2013.  FINDINGS: Lordotic positioning. Right internal jugular central venous catheter with tip terminating in the proximal the mid superior vena cava, approximately 3 cm above the position noted on the prior examination. Lung volumes are low. No acute consolidative airspace disease. No pleural effusions. No evidence of pulmonary edema. Heart size is borderline enlarged. Upper mediastinal contours are unremarkable.  IMPRESSION: 1. Low lung volumes without radiographic evidence of acute cardiopulmonary disease.  2. Position of right internal jugular central venous catheter appears higher than the prior study. Although this may partially related to lordotic positioning on today's examination, clinical correlation to exclude catheter migration is recommended.   Electronically Signed   By: Trudie Reed M.D.   On: 05/28/2013 20:21         Subjective: Patient states that pain has remained stable since yesterday. Denies any headache, visual disturbance, dizziness, chest pain, shortness breath, nausea, vomiting, diarrhea, abdominal pain, dysuria, hematuria.  Objective: Filed Vitals:   05/31/13 0807 05/31/13 1558 05/31/13 2039 06/01/13 0453  BP:  100/58 113/64 116/72  Pulse:  78 91 70  Temp:  98 F (36.7 C)  97.9 F (36.6 C)  TempSrc:  Oral  Oral Oral  Resp: 16 18 18 19   Height:      Weight:      SpO2: 94% 100% 99% 100%    Intake/Output Summary (Last 24 hours) at 06/01/13 1156 Last data filed at 05/31/13 1900  Gross per 24 hour  Intake   1260 ml  Output      0 ml  Net   1260 ml   Weight change:  Exam:   General:  Pt is alert, follows commands appropriately, not in acute distress  HEENT: No icterus, No thrush,  [place]/AT  Cardiovascular: RRR, S1/S2, no rubs, no gallops  Respiratory: CTA bilaterally, no wheezing, no crackles, no rhonchi  Abdomen: Soft/+BS, non tender, non distended, no guarding  Extremities: No edema, No lymphangitis, No petechiae, No rashes, no synovitis  Data Reviewed: Basic Metabolic Panel:  Recent Labs Lab 05/28/13 2007 05/30/13 0540 06/01/13 0534  NA 133* 138 138  K 3.4* 4.3 4.8  CL 99 107 107  CO2 24 23 24   GLUCOSE 101* 93 89  BUN 19 14 10   CREATININE 1.13 1.07 0.95  CALCIUM 8.9 8.8 8.7   Liver Function Tests:  Recent Labs Lab 05/28/13 2007 05/30/13 0540 06/01/13 0534  AST 22 22 16   ALT 12 11 10   ALKPHOS 80 71 74  BILITOT 0.6 3.1* 1.2  PROT 7.3 6.3 6.2  ALBUMIN 4.0 3.4* 3.1*   No results found for this basename: LIPASE, AMYLASE,  in  the last 168 hours No results found for this basename: AMMONIA,  in the last 168 hours CBC:  Recent Labs Lab 05/28/13 2007 05/29/13 0612 05/30/13 0540 05/31/13 0510 06/01/13 1025  WBC 11.8* 14.2* 13.2* 7.6 5.2  NEUTROABS 8.1* 4.1  --   --   --   HGB 3.7* 3.4* 5.2* 5.1* 5.4*  HCT 10.8* 10.1* 15.7* 15.5* 16.0*  MCV 108.0* 111.0* 102.6* 109.9* 105.3*  PLT 433* 366 344 304 296   Cardiac Enzymes: No results found for this basename: CKTOTAL, CKMB, CKMBINDEX, TROPONINI,  in the last 168 hours BNP: No components found with this basename: POCBNP,  CBG: No results found for this basename: GLUCAP,  in the last 168 hours  No results found for this or any previous visit (from the past 240 hour(s)).   Scheduled Meds: . folic acid  1  mg Oral Daily  . gabapentin  100 mg Oral BID  . ketorolac  30 mg Intravenous Q6H  . morphine  60 mg Oral BID  . polyethylene glycol  17 g Oral BID  . senna  2 tablet Oral Daily  . sodium chloride  10-40 mL Intracatheter Q12H   Continuous Infusions: . 0.45 % NaCl with KCl 20 mEq / L 75 mL/hr at 05/31/13 2334     Kellar Westberg, DO  [facility] Pager [phone]  If 7PM-7AM, please contact night-coverage [email or web address] Password [email or web address] 06/01/2013, 11:56 AM   LOS: 4 days

## 2013-06-01 NOTE — Discharge Summary (Signed)
Physician Discharge Summary  Camdyn Beske ZOX:[id number] DOB: 10/17/1985 DOA: 05/28/2013  PCP: No primary provider on file.  Admit date: 05/28/2013 Discharge date: 06/05/13 Recommendations for Outpatient Follow-up:  1. Pt will need to follow up with Dr. Keane Scrape on 06/09/13 @330pm   post discharge 2. Please obtain BMP to evaluate electrolytes and kidney function 3. Please also check CBC  06/07/13 at cancer center with results to go to Dr. Cyndie Chime 4. Followup with hemeOnc in 2-3weeks--Dr. Cyndie Chime   Discharge Diagnoses:  Principal Problem:   Sickle cell pain crisis Active Problems:   Sickle cell anemia   Backache   Chronic pain   Other malaise and fatigue   Non-ABO incompatible blood transfusion   Fever, unspecified Fever  -Source was unclear but may be related to the patient's blood  Transfusion -Fever resolved spontaneously and the patient remained hemodynamically stable and nontoxic. - Chest x-ray negative for infiltrate  - Urinalysis negative for pyuria  - Blood cultures negative to date  - started empiric antibiotics which were ultimately discontinued - Further workup if fever returns  Delayed transfusion hemolytic reaction  -patient's LDH increased to 616 with mild increase in his bilirubin after his second set of PRBCs - This was discussed with hematology, Dr. Cyndie Chime prior to pt's discharge -pt will need to come back to heme clinic for CBC on 05/28/13  -Pt will f/u with Dr. Cyndie Chime in office in few weeks--heme wil arrange Sickle cell pain crisis with backache, chronic pain and fatigue  -overall pain has improved to baseline  -appreciate Dr. Cyndie Chime followup  -Pain management: Continue long acting medications at usual home dose: MS Contin 60 mg every 12 hours. Avoid increasing dose further to prevent further issues with tolerance.  -discontinued PCA Dilaudid(05/31/2013)  - 05/31/13--Start intermittent dosing IV hydromorphone 3mg  every 2 hours (patient  has been averaging 1.25-1.5 mg every hour)  -pain remained stable on intermitten dosing of IV hydromorphone  - 06/01/2013--start PO Dilaudid 4 mg every 4 hours  -decrease IV hydromorphone 2 mg IV every 4 hours when necessary breakthrough pain  - This was discussed with the patient and he is agreeable to the adjustment  -Toradol IV Q 6 hours for anti-inflammatory effect. Monitor renal function.  -Adjuvant pain therapy: Neurontin 100 mg twice a day.  -Monitor CBC (add differential if on Hydrea) Q day until stable.  -Monitor bilirubin/LDH Q 72 hours to monitor hemolysis. Current bilirubin: 0.6, LDH 578--recheck on 06/01/13  - 06/01/2013 LDH 337, reticulocyte count 17.7, total bilirubin 1.2  -06/02/13 LDH-->321  -Reticulocyte count 16.4% at time of admission, no evidence of aplastic anemia. Coombs test not indicative of delayed transfusion reaction after first set of transfused PRBCs -Ferritin level last checked 03/23/2013, and was 618. No indication for Desferal.  -On Hydrea routinely. Will resume.  -K pad PRN.  -Continue folic acid.  Active problems:  Hyperkalemia  -resolved -Change IV fluids to normal saline without potassium  -Kayexalate x1 dose  Constipation induced by narcotics  -MiraLAX twice a day.  -Increase mobilization as tolerated.  -add senakot  - had BM with lactulose  Chronic anemia  -Patient's baseline hemoglobin is: 7-8 mg/dL.  -Patient's transfusion hemoglobin threshold is: Less than 6 mg/dL, but will only transfuse for a hemoglobin of< 5 mg/dL per hematology recommendations.  -Current hemoglobin remained stable in 5 range status post 2 units of packed red blood cells 05/29/13. Has multiple antibodies which have not allowed timely administration of blood products. Given the least incompatible blood available.  -Transfused 2  additional units of PRBCs per Dr. Cyndie Chime (9/19)  -Although LDH and bilirubin are increased, previous direct Coombs test was negative for  posttransfusion hemolysis after first set of PRBCs, but pt did have hemolysis after second set of PRBCs--see above     Discharge Condition: stable  Disposition:  Follow-up Information   Follow up with Jeanann Lewandowsky, MD On 06/09/2013. (@3 :30p/bring photo id/$20 fee/medicine in bottles.)    Specialty:  Internal Medicine   Contact information:   [address] [place] [address] [phone]       Follow up with Levert Feinstein, MD In 2 weeks. (CBC at cancer center 06/07/13)    Specialty:  Oncology   Contact information:   [address] [place] [place] [address] [phone]       Diet: regular Wt Readings from Last 3 Encounters:  05/28/13 76.6 kg (168 lb 14 oz)  05/14/13 77.111 kg (170 lb)  03/27/13 79.742 kg (175 lb 12.8 oz)    History of present illness:  27 y.o. male has a past medical history significant for sickle cell disease, recently hospitalized and discharged one week prior to this admission., presents to the emergency room with a chief complaint of back pain, which is similar to his previous crisis, unrelieved by home oral medications. He denies any chest pain or breathing difficulties. He felt generalized weakness for the past day. He denies headache, felt a bit lightheaded yesterday. He denies any abdominal pain, has mild nausea but denies vomiting. Denies any fever or chills at home. He denies any burning urination or dysuria. In the ED, his Hemoglobin was low at 3.7. Hematology was consulted to see the patient. Dr. Cyndie Chime recommended transfusion with 2 units of least incompatible blood crossmatch for the patient's major antigens against which he has reacted in the past. The patient's hemoglobin improved and remained stable at above 5.  A direct Coombs test was negative; therefore, the likelihood that he was experiencing a delayed hemolytic transfusion reaction was low.. Initial serum LDH was 578. This gradually improved throughout the hospitalization.  Baseline LDH was 293. The patient was started on IV hydromorphone. This did not relieve his pain. He was started on hydromorphone PCA pump. However, the patient manipulated the PCA pump and unlocked the pump to give himself additional doses of hydromorphone. Toradol and gabapentin were added to the patient's regimen to help manage his pain. The patient was advised not to do this any further as he would put him at risk for overdose. The patient's hemoglobin remained stable and actually improved with the hospitalization.  Hematology recommended transfusion for hemoglobin less than 5. However, the patient was transfused an additional 2 units of PRBCs (06/02/2013), bringing a total of 4 units transfused during the hospitalization. After the transfusion, the patient's potassium did go up to 5.5 and his bilirubin and LDH increased. His hyperkalemia was treated with Kayexalate and IV fluids. Repeat bilirubin and LDH also improved as the hospitalization progressed.. The patient was transitioned off of his PCA pump to intermittent hydromorphone IV. This was ultimately changed to his home dose of hydromorphone PO while maintaining IV dosing for breakthrough pain. The patient's MS Contin home dose was continued throughout the hospitalization. The patient developed fever after his second blood transfusion on  06/03/2013. Cultures were negative. Chest x-ray did not reveal any infiltrates. The patient remained hemodynamically stable. He was started on empiric antibiotics which were ultimately discontinued. He remained afebrile for over 36 hours prior to discharge.  Consultants: Hematology-Dr. Cyndie Chime  Discharge Exam: Filed Vitals:   06/05/13 0550  BP: 100/56  Pulse: 85  Temp:   Resp: 22   Filed Vitals:   06/04/13 0534 06/04/13 1359 06/04/13 2056 06/05/13 0550  BP: 101/59 109/58 112/58 100/56  Pulse: 78 97 79 85  Temp: 98 F (36.7 C) 97.8 F (36.6 C) 98.3 F (36.8 C)   TempSrc: Oral Oral Oral    Resp: 16 18 20 22   Height:      Weight:      SpO2: 100% 100% 100% 100%   General: A&O x 3, NAD, pleasant, cooperative Cardiovascular: RRR, no rub, no gallop, no S3 Respiratory: CTAB, no wheeze, no rhonchi Abdomen:soft, nontender, nondistended, positive bowel sounds Extremities: No edema, No lymphangitis, no petechiae  Discharge Instructions      Discharge Orders   Future Appointments Provider Department Dept Phone   06/09/2013 3:30 PM [place] Cyndie Mull, MD [facility] [phone]   Future Orders Complete By Expires   Diet - low sodium heart healthy  As directed    Increase activity slowly  As directed        Medication List         albuterol 108 (90 BASE) MCG/ACT inhaler  Commonly known as:  PROVENTIL HFA;VENTOLIN HFA  Inhale 1 puff into the lungs every 6 (six) hours as needed for wheezing or shortness of breath. wheezing     diphenhydrAMINE 25 MG tablet  Commonly known as:  BENADRYL  Take 25 mg by mouth every 8 (eight) hours as needed for itching.     fluticasone 50 MCG/ACT nasal spray  Commonly known as:  FLONASE  Place 1 spray into the nose daily.     folic acid 1 MG tablet  Commonly known as:  FOLVITE  Take 1 mg by mouth every morning.     gabapentin 100 MG capsule  Commonly known as:  NEURONTIN  Take 1 capsule (100 mg total) by mouth 2 (two) times daily.     HYDROmorphone 4 MG tablet  Commonly known as:  DILAUDID  Take 1 tablet (4 mg total) by mouth every 4 (four) hours as needed for pain.     hydroxyurea 500 MG capsule  Commonly known as:  HYDREA  Take 2,000 mg by mouth every morning. May take with food to minimize GI side effects.     morphine 60 MG 12 hr tablet  Commonly known as:  MS CONTIN  Take 1 tablet (60 mg total) by mouth 2 (two) times daily.     promethazine 25 MG tablet  Commonly known as:  PHENERGAN  Take 1 tablet (25 mg total) by mouth every 6 (six) hours as needed for nausea.     sennosides-docusate  sodium 8.6-50 MG tablet  Commonly known as:  SENOKOT-S  Take 1 tablet by mouth 2 (two) times daily.         The results of significant diagnostics from this hospitalization (including imaging, microbiology, ancillary and laboratory) are listed below for reference.    Significant Diagnostic Studies: Dg Chest 1 View  05/14/2013   *RADIOLOGY REPORT*  Clinical Data: Evaluate line placement  CHEST - 1 VIEW  Comparison: Prior radiograph from 02/18/2013  Findings: There has been interval placement of a right IJ power line with tip located in the distal SVC.  Previously seen left- sided central venous port has been removed.  Cardiac and mediastinal silhouettes are unchanged.  The lungs are hypoinflated.  Mild diffuse  prominence of the bronchovascular markings may be secondary to hypoinflation.  No definite focal infiltrate identified.  No pleural effusion or pulmonary edema.  No pneumothorax.  Left shoulder arthroplasty again noted.  IMPRESSION:  Tip of right IJ power line in the distal SVC.  No pneumothorax.   Original Report Authenticated By: Rise Mu, M.D.   Dg Cervical Spine 2 Or 3 Views  05/15/2013   *RADIOLOGY REPORT*  Clinical Data: Neck pain  CERVICAL SPINE - 2-3 VIEW  Comparison: None.  Findings: Seven cervical segments are well visualized.  Vertebral body height is well-maintained.  There are no changes to suggest acute fracture.  No soft tissue changes are seen.  The odontoid is within normal limits.  A right-sided PICC line is seen at the cavoatrial junction.  No other focal abnormality is noted.  IMPRESSION: No acute abnormalities seen.   Original Report Authenticated By: Alcide Clever, M.D.   Dg Lumbar Spine Complete  05/17/2013   *RADIOLOGY REPORT*  Clinical Data: Back pain for 4 days  LUMBAR SPINE - COMPLETE 4+ VIEW  Comparison: 08/26/2012  Findings: Normal alignment with no fracture.  Again noted is mild endplate sclerosis and depression consistent with sickle cell change.   Osteosclerosis of the sacrum stable.  Sclerotic lesion right femoral head possibly avascular necrosis, stable.  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  No acute findings in the lumbar spine.   Original Report Authenticated By: Esperanza Heir, M.D.   Dg Chest Port 1 View  05/28/2013   CLINICAL DATA:  Sickle cell crisis. Weakness.  EXAM: PORTABLE CHEST - 1 VIEW  COMPARISON:  Chest x-ray 05/14/2013.  FINDINGS: Lordotic positioning. Right internal jugular central venous catheter with tip terminating in the proximal the mid superior vena cava, approximately 3 cm above the position noted on the prior examination. Lung volumes are low. No acute consolidative airspace disease. No pleural effusions. No evidence of pulmonary edema. Heart size is borderline enlarged. Upper mediastinal contours are unremarkable.  IMPRESSION: 1. Low lung volumes without radiographic evidence of acute cardiopulmonary disease.  2. Position of right internal jugular central venous catheter appears higher than the prior study. Although this may partially related to lordotic positioning on today's examination, clinical correlation to exclude catheter migration is recommended.   Electronically Signed   By: Trudie Reed M.D.   On: 05/28/2013 20:21     Microbiology: Recent Results (from the past 240 hour(s))  CULTURE, BLOOD (ROUTINE X 2)     Status: None   Collection Time    06/03/13 11:10 PM      Result Value Range Status   Specimen Description BLOOD LEFT HAND   Final   Special Requests     Final   Value: BOTTLES DRAWN AEROBIC AND ANAEROBIC 3CC BOTH BOTTELS   Culture  Setup Time     Final   Value: 06/04/2013 18:08     Performed at [facility]   Culture     Final   Value:        BLOOD CULTURE RECEIVED NO GROWTH TO DATE CULTURE WILL BE HELD FOR 5 DAYS BEFORE ISSUING A FINAL NEGATIVE REPORT     Performed at [facility]   Report  Status PENDING   Incomplete  CULTURE, BLOOD (ROUTINE X 2)     Status: None   Collection Time    06/03/13 11:15 PM      Result Value Range Status   Specimen  Description BLOOD RIGHT HAND   Final   Special Requests     Final   Value: BOTTLES DRAWN AEROBIC AND ANAEROBIC 3CC BOTH BOTTLES   Culture  Setup Time     Final   Value: 06/04/2013 18:09     Performed at [facility]   Culture     Final   Value:        BLOOD CULTURE RECEIVED NO GROWTH TO DATE CULTURE WILL BE HELD FOR 5 DAYS BEFORE ISSUING A FINAL NEGATIVE REPORT     Performed at [facility]   Report Status PENDING   Incomplete     Labs: Basic Metabolic Panel:  Recent Labs Lab 05/30/13 0540 06/01/13 0534 06/03/13 0830 06/04/13 0515 06/05/13 0625  NA 138 138 134* 136 136  K 4.3 4.8 5.5* 4.4 4.3  CL 107 107 104 105 104  CO2 23 24 23 23 23   GLUCOSE 93 89 90 97 92  BUN 14 10 18 21 21   CREATININE 1.07 0.95 0.98 1.01 0.97  CALCIUM 8.8 8.7 9.1 9.0 8.9   Liver Function Tests:  Recent Labs Lab 05/30/13 0540 06/01/13 0534 06/03/13 0830 06/04/13 0515 06/05/13 0625  AST 22 16 37 29 31  ALT 11 10 17 14 15   ALKPHOS 71 74 82 81 81  BILITOT 3.1* 1.2 5.0* 4.5* 5.0*  PROT 6.3 6.2 6.8 6.7 7.2  ALBUMIN 3.4* 3.1* 3.4* 3.5 3.7   No results found for this basename: LIPASE, AMYLASE,  in the last 168 hours No results found for this basename: AMMONIA,  in the last 168 hours CBC:  Recent Labs Lab 06/01/13 1025 06/02/13 0445 06/03/13 0830 06/04/13 0515 06/05/13 0625  WBC 5.2 5.9 7.7 7.5 8.7  NEUTROABS  --  2.7  --   --   --   HGB 5.4* 5.1* 6.8* 5.9* 5.0*  HCT 16.0* 15.0* 20.7* 17.0* 14.1*  MCV 105.3* 104.2* 98.1 101.2* 102.2*  PLT 296 253 211 176 191   Cardiac Enzymes: No results found for this basename: CKTOTAL, CKMB, CKMBINDEX, TROPONINI,  in the last 168 hours BNP: No components found with this basename: POCBNP,  CBG: No results found for this basename: GLUCAP,  in the last 168 hours  Time  coordinating discharge:  Greater than 30 minutes  Signed:  Betzalel Umbarger, DO [facility] Pager: [phone] 06/05/2013, 9:25 AM

## 2013-06-02 LAB — CBC WITH DIFFERENTIAL/PLATELET
Basophils Absolute: 0 10*3/uL (ref 0.0–0.1)
Eosinophils Absolute: 0.1 10*3/uL (ref 0.0–0.7)
Lymphs Abs: 2.4 10*3/uL (ref 0.7–4.0)
MCHC: 34 g/dL (ref 30.0–36.0)
MCV: 104.2 fL — ABNORMAL HIGH (ref 78.0–100.0)
Monocytes Relative: 12 % (ref 3–12)
Platelets: 253 10*3/uL (ref 150–400)
RDW: 21.3 % — ABNORMAL HIGH (ref 11.5–15.5)
WBC: 5.9 10*3/uL (ref 4.0–10.5)

## 2013-06-02 MED ORDER — HYDROMORPHONE HCL PF 2 MG/ML IJ SOLN
2.0000 mg | INTRAMUSCULAR | Status: DC | PRN
  Administered 2013-06-02 – 2013-06-05 (×13): 2 mg via INTRAVENOUS
  Filled 2013-06-02 (×13): qty 1

## 2013-06-02 NOTE — Progress Notes (Signed)
Hemoglobin holding stable @ 5 grams Crisis pain slowly subsiding - mostly low back & legs Exam: He is alert and oriented. PERRLA. Extra erythema or exudate. Lungs are clear. Regular cardiac rhythm. Extremities no edema no calf tenderness. Neurologic motor strength 5 over 5, reflexes 1+ symmetric. Additional pertinent labs: LDH has fallen from peak value of 578 down to today's value of 321 Reticulocyte count. Remains elevated at 17.7%. Bilirubin stable at 3.1 mg. Impression: #1. Sickle crisis. 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 I will go ahead and order a transfusion of these units. Discussed with the patient. Risk of a transfusion reaction remains ever present. However, he did well with units given earlier this week.

## 2013-06-02 NOTE — Progress Notes (Signed)
[facility] PROGRESS NOTE  Dennis Bernard ZHY:[id number] DOB: 1986/08/24 DOA: 05/28/2013 PCP: No primary provider on file.  Assessment/Plan: Sickle cell pain crisis with backache, chronic pain and fatigue  -appreciate Dr. Cyndie Chime followup  -Pain management: Continue long acting medications at usual home dose: MS Contin 60 mg every 12 hours. Avoid increasing dose further to prevent further issues with tolerance.  -discontinued PCA Dilaudid(05/31/2013)  - 05/31/13--Start intermittent dosing IV hydromorphone 3mg  every 2 hours (patient has been averaging 1.25-1.5 mg every hour)  -pain remained stable on intermitten dosing of IV hydromorphone  - 06/01/2013--start PO Dilaudid 4 mg every 4 hours  -decrease IV hydromorphone 2 mg IV every 3 hours when necessary breakthrough pain  - This was discussed with the patient and he is agreeable to the adjustment  -Toradol IV Q 6 hours for anti-inflammatory effect. Monitor renal function.  -Adjuvant pain therapy: Neurontin 100 mg twice a day.  -Today the patient's pain is a level 7  -Monitor CBC (add differential if on Hydrea) Q day until stable.  -Monitor bilirubin/LDH Q 72 hours to monitor hemolysis. Current bilirubin: 0.6, LDH 578--recheck on 06/01/13  - 06/01/2013 LDH 337, reticulocyte count 17.7, total bilirubin 1.2  -06/02/13 LDH-->321 -Reticulocyte count 16.4% at time of admission, no evidence of aplastic anemia. Coombs test not indicative of delayed transfusion reaction.  -Ferritin level last checked 03/23/2013, and was 618. No indication for Desferal.  -On Hydrea routinely. Will resume.  -Continue IVF: 1/2 NS at 75 cc an hour. Volume status: Mildly dehydrated. D/C IVF when hydrated.  -K pad PRN.  -Continue folic acid.  Active problems:  Hypokalemia  -Corrected.  Constipation induced by narcotics  -MiraLAX twice a day.  -Increase mobilization as tolerated.  -add senakot  - Add a dose of lactulose  Chronic anemia  -Patient's  baseline hemoglobin is: 7-8 mg/dL.  -Patient's transfusion hemoglobin threshold is: Less than 6 mg/dL, but will only transfuse for a hemoglobin of< 5 mg/dL per hematology recommendations.  -Current hemoglobin remained stable in 5 range status post 2 units of packed red blood cells 05/29/13. Has multiple antibodies which have not allowed timely administration of blood products. Given the least incompatible blood available.  -Transfused 2 additional units of PRBCs per Dr. Cyndie Chime Family Communication: Pt at beside  Disposition Plan: Home when medically stable         Procedures/Studies: Dg Chest 1 View  05/14/2013   *RADIOLOGY REPORT*  Clinical Data: Evaluate line placement  CHEST - 1 VIEW  Comparison: Prior radiograph from 02/18/2013  Findings: There has been interval placement of a right IJ power line with tip located in the distal SVC.  Previously seen left- sided central venous port has been removed.  Cardiac and mediastinal silhouettes are unchanged.  The lungs are hypoinflated.  Mild diffuse prominence of the bronchovascular markings may be secondary to hypoinflation.  No definite focal infiltrate identified.  No pleural effusion or pulmonary edema.  No pneumothorax.  Left shoulder arthroplasty again noted.  IMPRESSION:  Tip of right IJ power line in the distal SVC.  No pneumothorax.   Original Report Authenticated By: Rise Mu, M.D.   Dg Cervical Spine 2 Or 3 Views  05/15/2013   *RADIOLOGY REPORT*  Clinical Data: Neck pain  CERVICAL SPINE - 2-3 VIEW  Comparison: None.  Findings: Seven cervical segments are well visualized.  Vertebral body height is well-maintained.  There are no changes to suggest acute fracture.  No soft tissue changes are seen.  The odontoid is within  normal limits.  A right-sided PICC line is seen at the cavoatrial junction.  No other focal abnormality is noted.  IMPRESSION: No acute abnormalities seen.   Original Report Authenticated By: Alcide Clever, M.D.    Dg Lumbar Spine Complete  05/17/2013   *RADIOLOGY REPORT*  Clinical Data: Back pain for 4 days  LUMBAR SPINE - COMPLETE 4+ VIEW  Comparison: 08/26/2012  Findings: Normal alignment with no fracture.  Again noted is mild endplate sclerosis and depression consistent with sickle cell change.  Osteosclerosis of the sacrum stable.  Sclerotic lesion right femoral head possibly avascular necrosis, stable.  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  No acute findings in the lumbar spine.   Original Report Authenticated By: Esperanza Heir, M.D.   Dg Chest Port 1 View  05/28/2013   CLINICAL DATA:  Sickle cell crisis. Weakness.  EXAM: PORTABLE CHEST - 1 VIEW  COMPARISON:  Chest x-ray 05/14/2013.  FINDINGS: Lordotic positioning. Right internal jugular central venous catheter with tip terminating in the proximal the mid superior vena cava, approximately 3 cm above the position noted on the prior examination. Lung volumes are low. No acute consolidative airspace disease. No pleural effusions. No evidence of pulmonary edema. Heart size is borderline enlarged. Upper mediastinal contours are unremarkable.  IMPRESSION: 1. Low lung volumes without radiographic evidence of acute cardiopulmonary disease.  2. Position of right internal jugular central venous catheter appears higher than the prior study. Although this may partially related to lordotic positioning on today's examination, clinical correlation to exclude catheter migration is recommended.   Electronically Signed   By: Trudie Reed M.D.   On: 05/28/2013 20:21         Subjective: Patient states that his pain is about the same. Denies fevers, chills, chest discomfort, nausea, vomiting, diarrhea, abdominal pain.  Objective: Filed Vitals:   06/01/13 1300 06/01/13 2142 06/02/13 0455 06/02/13 1400  BP: 111/62 116/77 110/61 110/60  Pulse: 73 72 75 96  Temp: 98 F (36.7 C) 99.3 F  (37.4 C) 98.3 F (36.8 C) 98.3 F (36.8 C)  TempSrc: Oral Oral Oral Oral  Resp: 17 16 18 18   Height:      Weight:      SpO2: 100% 100% 100% 99%    Intake/Output Summary (Last 24 hours) at 06/02/13 1719 Last data filed at 06/02/13 1300  Gross per 24 hour  Intake   1655 ml  Output      0 ml  Net   1655 ml   Weight change:  Exam:   General:  Pt is alert, follows commands appropriately, not in acute distress  HEENT: No icterus, No thrush,  [place]/AT  Cardiovascular: RRR, S1/S2, no rubs, no gallops  Respiratory: CTA bilaterally, no wheezing, no crackles, no rhonchi  Abdomen: Soft/+BS, non tender, non distended, no guarding  Extremities: No edema, No lymphangitis, No petechiae, No rashes, no synovitis  Data Reviewed: Basic Metabolic Panel:  Recent Labs Lab 05/28/13 2007 05/30/13 0540 06/01/13 0534  NA 133* 138 138  K 3.4* 4.3 4.8  CL 99 107 107  CO2 24 23 24   GLUCOSE 101* 93 89  BUN 19 14 10   CREATININE 1.13 1.07 0.95  CALCIUM 8.9 8.8 8.7   Liver Function Tests:  Recent Labs Lab 05/28/13 2007 05/30/13 0540 06/01/13 0534  AST 22 22 16   ALT 12 11 10   ALKPHOS 80 71 74  BILITOT 0.6 3.1* 1.2  PROT 7.3 6.3 6.2  ALBUMIN 4.0 3.4* 3.1*   No results found for this basename: LIPASE, AMYLASE,  in the last 168 hours No results found for this basename: AMMONIA,  in the last 168 hours CBC:  Recent Labs Lab 05/28/13 2007 05/29/13 0612 05/30/13 0540 05/31/13 0510 06/01/13 1025 06/02/13 0445  WBC 11.8* 14.2* 13.2* 7.6 5.2 5.9  NEUTROABS 8.1* 4.1  --   --   --  2.7  HGB 3.7* 3.4* 5.2* 5.1* 5.4* 5.1*  HCT 10.8* 10.1* 15.7* 15.5* 16.0* 15.0*  MCV 108.0* 111.0* 102.6* 109.9* 105.3* 104.2*  PLT 433* 366 344 304 296 253   Cardiac Enzymes: No results found for this basename: CKTOTAL, CKMB, CKMBINDEX, TROPONINI,  in the last 168 hours BNP: No components found with this basename: POCBNP,  CBG: No results found for this basename: GLUCAP,  in the last 168 hours  No  results found for this or any previous visit (from the past 240 hour(s)).   Scheduled Meds: . folic acid  1 mg Oral Daily  . gabapentin  100 mg Oral BID  . ketorolac  30 mg Intravenous Q6H  . morphine  60 mg Oral BID  . polyethylene glycol  17 g Oral BID  . senna  2 tablet Oral Daily  . sodium chloride  10-40 mL Intracatheter Q12H   Continuous Infusions: . 0.45 % NaCl with KCl 20 mEq / L 75 mL/hr (06/02/13 1336)     Myka Lukins, DO  [facility] Pager [phone]  If 7PM-7AM, please contact night-coverage [email or web address] Password [email or web address] 06/02/2013, 5:19 PM   LOS: 5 days

## 2013-06-03 ENCOUNTER — Inpatient Hospital Stay (HOSPITAL_COMMUNITY): Payer: Medicare Other

## 2013-06-03 LAB — RETICULOCYTES
RBC.: 2.11 MIL/uL — ABNORMAL LOW (ref 4.22–5.81)
Retic Count, Absolute: 170.9 10*3/uL (ref 19.0–186.0)
Retic Ct Pct: 8.1 % — ABNORMAL HIGH (ref 0.4–3.1)

## 2013-06-03 LAB — CBC
Platelets: 211 10*3/uL (ref 150–400)
RBC: 2.11 MIL/uL — ABNORMAL LOW (ref 4.22–5.81)
RDW: 20 % — ABNORMAL HIGH (ref 11.5–15.5)
WBC: 7.7 10*3/uL (ref 4.0–10.5)

## 2013-06-03 LAB — BASIC METABOLIC PANEL
Chloride: 104 mEq/L (ref 96–112)
GFR calc Af Amer: >90 mL/min (ref >90)
GFR calc non Af Amer: >90 mL/min (ref >90)
Potassium: 5.5 mEq/L — ABNORMAL HIGH (ref 3.5–5.1)

## 2013-06-03 LAB — LACTATE DEHYDROGENASE
LDH: 468 U/L — ABNORMAL HIGH (ref 94–250)
LDH: 576 U/L — ABNORMAL HIGH (ref 94–250)

## 2013-06-03 LAB — HEPATIC FUNCTION PANEL
Bilirubin, Direct: 0.5 mg/dL — ABNORMAL HIGH (ref 0.0–0.3)
Indirect Bilirubin: 4.5 mg/dL — ABNORMAL HIGH (ref 0.3–0.9)
Total Bilirubin: 5 mg/dL — ABNORMAL HIGH (ref 0.3–1.2)

## 2013-06-03 MED ORDER — SODIUM POLYSTYRENE SULFONATE 15 GM/60ML PO SUSP
15.0000 g | Freq: Once | ORAL | Status: AC
  Administered 2013-06-03: 10:00:00 15 g via ORAL
  Filled 2013-06-03: qty 60

## 2013-06-03 MED ORDER — PIPERACILLIN-TAZOBACTAM 3.375 G IVPB
3.3750 g | Freq: Three times a day (TID) | INTRAVENOUS | Status: DC
  Administered 2013-06-03 – 2013-06-05 (×5): 3.375 g via INTRAVENOUS
  Filled 2013-06-03 (×6): qty 50

## 2013-06-03 MED ORDER — HYDROXYUREA 500 MG PO CAPS
2000.0000 mg | ORAL_CAPSULE | Freq: Every morning | ORAL | Status: DC
  Administered 2013-06-04: 10:00:00 2000 mg via ORAL
  Filled 2013-06-03 (×2): qty 4

## 2013-06-03 MED ORDER — ACETAMINOPHEN 325 MG PO TABS
650.0000 mg | ORAL_TABLET | Freq: Four times a day (QID) | ORAL | Status: DC | PRN
  Administered 2013-06-03: 23:00:00 650 mg via ORAL
  Filled 2013-06-03: qty 2

## 2013-06-03 MED ORDER — SODIUM CHLORIDE 0.9 % IV SOLN
INTRAVENOUS | Status: DC
  Administered 2013-06-03 – 2013-06-04 (×3): via INTRAVENOUS

## 2013-06-03 MED ORDER — HYDROXYZINE HCL 25 MG PO TABS
25.0000 mg | ORAL_TABLET | Freq: Four times a day (QID) | ORAL | Status: DC | PRN
  Administered 2013-06-03 (×2): 25 mg via ORAL
  Filled 2013-06-03 (×2): qty 1

## 2013-06-03 NOTE — Progress Notes (Signed)
[facility] PROGRESS NOTE  Dennis Bernard ZOX:[id number] DOB: Aug 01, 1986 DOA: 05/28/2013 PCP: No primary provider on file.  Assessment/Plan: Sickle cell pain crisis with backache, chronic pain and fatigue  -appreciate Dr. Cyndie Chime followup  -Pain management: Continue long acting medications at usual home dose: MS Contin 60 mg every 12 hours. Avoid increasing dose further to prevent further issues with tolerance.  -discontinued PCA Dilaudid(05/31/2013)  - 05/31/13--Start intermittent dosing IV hydromorphone 3mg  every 2 hours (patient has been averaging 1.25-1.5 mg every hour)  -pain remained stable on intermitten dosing of IV hydromorphone  - 06/01/2013--start PO Dilaudid 4 mg every 4 hours  -decrease IV hydromorphone 2 mg IV every 4 hours when necessary breakthrough pain  - This was discussed with the patient and he is agreeable to the adjustment  -Toradol IV Q 6 hours for anti-inflammatory effect. Monitor renal function.  -Adjuvant pain therapy: Neurontin 100 mg twice a day.  -Today the patient's pain is a level 7  -Monitor CBC (add differential if on Hydrea) Q day until stable.  -Monitor bilirubin/LDH Q 72 hours to monitor hemolysis. Current bilirubin: 0.6, LDH 578--recheck on 06/01/13  - 06/01/2013 LDH 337, reticulocyte count 17.7, total bilirubin 1.2  -06/02/13 LDH-->321  -Reticulocyte count 16.4% at time of admission, no evidence of aplastic anemia. Coombs test not indicative of delayed transfusion reaction.  -Ferritin level last checked 03/23/2013, and was 618. No indication for Desferal.  -On Hydrea routinely. Will resume.  -K pad PRN.  -Continue folic acid.  Active problems:  Hyperkalemia -Change IV fluids to normal saline without potassium -Kayexalate x1 dose Constipation induced by narcotics  -MiraLAX twice a day.  -Increase mobilization as tolerated.  -add senakot  - had BM with lactulose  Chronic anemia  -Patient's baseline hemoglobin is: 7-8 mg/dL.   -Patient's transfusion hemoglobin threshold is: Less than 6 mg/dL, but will only transfuse for a hemoglobin of< 5 mg/dL per hematology recommendations.  -Current hemoglobin remained stable in 5 range status post 2 units of packed red blood cells 05/29/13. Has multiple antibodies which have not allowed timely administration of blood products. Given the least incompatible blood available.  -Transfused 2 additional units of PRBCs per Dr. Cyndie Chime (9/19) -Although LDH and bilirubin are increased, previous direct Coombs test was negative for posttransfusion hemolysis -Recheck in the morning Family Communication: Pt at beside  Disposition Plan: Home when medically stable          Procedures/Studies: Dg Chest 1 View  05/14/2013   *RADIOLOGY REPORT*  Clinical Data: Evaluate line placement  CHEST - 1 VIEW  Comparison: Prior radiograph from 02/18/2013  Findings: There has been interval placement of a right IJ power line with tip located in the distal SVC.  Previously seen left- sided central venous port has been removed.  Cardiac and mediastinal silhouettes are unchanged.  The lungs are hypoinflated.  Mild diffuse prominence of the bronchovascular markings may be secondary to hypoinflation.  No definite focal infiltrate identified.  No pleural effusion or pulmonary edema.  No pneumothorax.  Left shoulder arthroplasty again noted.  IMPRESSION:  Tip of right IJ power line in the distal SVC.  No pneumothorax.   Original Report Authenticated By: Rise Mu, M.D.   Dg Cervical Spine 2 Or 3 Views  05/15/2013   *RADIOLOGY REPORT*  Clinical Data: Neck pain  CERVICAL SPINE - 2-3 VIEW  Comparison: None.  Findings: Seven cervical segments are well visualized.  Vertebral body height is well-maintained.  There are no changes to suggest acute fracture.  No soft tissue changes are seen.  The odontoid is within normal limits.  A right-sided PICC line is seen at the cavoatrial junction.  No other focal  abnormality is noted.  IMPRESSION: No acute abnormalities seen.   Original Report Authenticated By: Alcide Clever, M.D.   Dg Lumbar Spine Complete  05/17/2013   *RADIOLOGY REPORT*  Clinical Data: Back pain for 4 days  LUMBAR SPINE - COMPLETE 4+ VIEW  Comparison: 08/26/2012  Findings: Normal alignment with no fracture.  Again noted is mild endplate sclerosis and depression consistent with sickle cell change.  Osteosclerosis of the sacrum stable.  Sclerotic lesion right femoral head possibly avascular necrosis, stable.  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  No acute findings in the lumbar spine.   Original Report Authenticated By: Esperanza Heir, M.D.   Dg Chest Port 1 View  05/28/2013   CLINICAL DATA:  Sickle cell crisis. Weakness.  EXAM: PORTABLE CHEST - 1 VIEW  COMPARISON:  Chest x-ray 05/14/2013.  FINDINGS: Lordotic positioning. Right internal jugular central venous catheter with tip terminating in the proximal the mid superior vena cava, approximately 3 cm above the position noted on the prior examination. Lung volumes are low. No acute consolidative airspace disease. No pleural effusions. No evidence of pulmonary edema. Heart size is borderline enlarged. Upper mediastinal contours are unremarkable.  IMPRESSION: 1. Low lung volumes without radiographic evidence of acute cardiopulmonary disease.  2. Position of right internal jugular central venous catheter appears higher than the prior study. Although this may partially related to lordotic positioning on today's examination, clinical correlation to exclude catheter migration is recommended.   Electronically Signed   By: Trudie Reed M.D.   On: 05/28/2013 20:21         Subjective: Patient states the pain is gradually improving. Denies any fevers, chills, chest discomfort, nausea, vomiting, diarrhea, abdominal pain. He has some mild dyspnea on exertion. No dysuria or  hematuria.  Objective: Filed Vitals:   06/03/13 0424 06/03/13 0524 06/03/13 0605 06/03/13 1300  BP: 111/68 120/75 117/83 105/65  Pulse: 76 69 71 94  Temp: 98.6 F (37 C) 98.6 F (37 C) 98.5 F (36.9 C) 99.3 F (37.4 C)  TempSrc: Oral Oral Oral Oral  Resp: 16 16 16 14   Height:      Weight:      SpO2: 100% 100% 100% 100%    Intake/Output Summary (Last 24 hours) at 06/03/13 1457 Last data filed at 06/03/13 0657  Gross per 24 hour  Intake 2979.58 ml  Output    350 ml  Net 2629.58 ml   Weight change:  Exam:   General:  Pt is alert, follows commands appropriately, not in acute distress  HEENT: No icterus, No thrush, [place]/AT  Cardiovascular: RRR, S1/S2, no rubs, no gallops  Respiratory: CTA bilaterally, no wheezing, no crackles, no rhonchi  Abdomen: Soft/+BS, non tender, non distended, no guarding  Extremities: No edema, No lymphangitis, No petechiae, No rashes, no synovitis  Data Reviewed: Basic Metabolic Panel:  Recent Labs Lab 05/28/13 2007 05/30/13 0540 06/01/13 0534 06/03/13 0830  NA 133* 138 138 134*  K 3.4* 4.3 4.8 5.5*  CL 99 107 107 104  CO2 24 23 24 23   GLUCOSE 101* 93 89 90  BUN 19 14 10 18   CREATININE 1.13 1.07 0.95 0.98  CALCIUM 8.9 8.8 8.7 9.1   Liver Function Tests:  Recent Labs Lab 05/28/13 2007 05/30/13 0540 06/01/13 0534 06/03/13  0830  AST 22 22 16  37  ALT 12 11 10 17   ALKPHOS 80 71 74 82  BILITOT 0.6 3.1* 1.2 5.0*  PROT 7.3 6.3 6.2 6.8  ALBUMIN 4.0 3.4* 3.1* 3.4*   No results found for this basename: LIPASE, AMYLASE,  in the last 168 hours No results found for this basename: AMMONIA,  in the last 168 hours CBC:  Recent Labs Lab 05/28/13 2007 05/29/13 0612 05/30/13 0540 05/31/13 0510 06/01/13 1025 06/02/13 0445 06/03/13 0830  WBC 11.8* 14.2* 13.2* 7.6 5.2 5.9 7.7  NEUTROABS 8.1* 4.1  --   --   --  2.7  --   HGB 3.7* 3.4* 5.2* 5.1* 5.4* 5.1* 6.8*  HCT 10.8* 10.1* 15.7* 15.5* 16.0* 15.0* 20.7*  MCV 108.0* 111.0* 102.6*  109.9* 105.3* 104.2* 98.1  PLT 433* 366 344 304 296 253 211   Cardiac Enzymes: No results found for this basename: CKTOTAL, CKMB, CKMBINDEX, TROPONINI,  in the last 168 hours BNP: No components found with this basename: POCBNP,  CBG: No results found for this basename: GLUCAP,  in the last 168 hours  No results found for this or any previous visit (from the past 240 hour(s)).   Scheduled Meds: . folic acid  1 mg Oral Daily  . gabapentin  100 mg Oral BID  . morphine  60 mg Oral BID  . polyethylene glycol  17 g Oral BID  . senna  2 tablet Oral Daily  . sodium chloride  10-40 mL Intracatheter Q12H   Continuous Infusions: . sodium chloride 75 mL/hr at 06/03/13 1002     Naija Troost, DO  [facility] Pager [phone]  If 7PM-7AM, please contact night-coverage [email or web address] Password [email or web address] 06/03/2013, 2:57 PM   LOS: 6 days

## 2013-06-03 NOTE — Progress Notes (Signed)
Pt c/o 6/10 pressure in mid chest. Pt currently receiving PRBC; VS stable and rate reduced from 115ml/hr to 156ml/hr. Pt was repositioned and oxygen provided at 2L via [place]. Pt reported reduction in chest pressure to 3/10 after interventions. NP on call was notified. No new orders given at this time. Will continue to monitor. Dennis Bernard

## 2013-06-03 NOTE — Progress Notes (Signed)
ANTIBIOTIC CONSULT NOTE - INITIAL  Pharmacy Consult for zosyn Indication: Fever  No Known Allergies  Patient Measurements: Height: 5\' 9"  (175.3 cm) Weight: 168 lb 14 oz (76.6 kg) IBW/kg (Calculated) : 70.7 Adjusted Body Weight:   Vital Signs: Temp: 101.9 F (38.8 C) (09/20 2205) Temp src: Oral (09/20 2205) BP: 113/76 mmHg (09/20 2144) Pulse Rate: 97 (09/20 2144) Intake/Output from previous day: 09/19 0701 - 09/20 0700 In: 3459.6 [P.O.:720; I.V.:2121.3; Blood:618.3] Out: 350 [Urine:350] Intake/Output from this shift: Total I/O In: 368.8 [I.V.:368.8] Out: 100 [Urine:100]  Labs:  Recent Labs  06/01/13 0534 06/01/13 1025 06/02/13 0445 06/03/13 0830  WBC  --  5.2 5.9 7.7  HGB  --  5.4* 5.1* 6.8*  PLT  --  296 253 211  CREATININE 0.95  --   --  0.98   Estimated Creatinine Clearance: 112.2 ml/min (by C-G formula based on Cr of 0.98). No results found for this basename: VANCOTROUGH, VANCOPEAK, VANCORANDOM, GENTTROUGH, GENTPEAK, GENTRANDOM, TOBRATROUGH, TOBRAPEAK, TOBRARND, AMIKACINPEAK, AMIKACINTROU, AMIKACIN,  in the last 72 hours   Microbiology: No results found for this or any previous visit (from the past 720 hour(s)).  Medical History: Past Medical History  Diagnosis Date  . Sickle cell disease   . Avascular necrosis of femur head, right   . H/O allergic rhinitis   . H/O hypokalemia   . Chronic pain syndrome   . H/O wheezing     with colds  . 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  Goal of Therapy:  Zosyn based on renal function   Plan:  Follow up culture results Zosyn 3.375g IV Q8H infused over 4hrs.   Aleene Davidson Crowford 06/03/2013,11:08 PM

## 2013-06-04 DIAGNOSIS — R509 Fever, unspecified: Secondary | ICD-10-CM

## 2013-06-04 LAB — COMPREHENSIVE METABOLIC PANEL
ALT: 14 U/L (ref 0–53)
AST: 29 U/L (ref 0–37)
Alkaline Phosphatase: 81 U/L (ref 39–117)
Calcium: 9 mg/dL (ref 8.4–10.5)
GFR calc Af Amer: >90 mL/min (ref >90)
Glucose, Bld: 97 mg/dL (ref 70–99)
Potassium: 4.4 mEq/L (ref 3.5–5.1)
Sodium: 136 mEq/L (ref 135–145)
Total Protein: 6.7 g/dL (ref 6.0–8.3)

## 2013-06-04 LAB — URINALYSIS, ROUTINE W REFLEX MICROSCOPIC
Bilirubin Urine: NEGATIVE
Glucose, UA: NEGATIVE mg/dL
Hgb urine dipstick: NEGATIVE
Ketones, ur: NEGATIVE mg/dL
Specific Gravity, Urine: 1.012 (ref 1.005–1.030)
pH: 6 (ref 5.0–8.0)

## 2013-06-04 LAB — CBC
Hemoglobin: 5.9 g/dL — CL (ref 13.0–17.0)
MCH: 35.1 pg — ABNORMAL HIGH (ref 26.0–34.0)
MCHC: 34.7 g/dL (ref 30.0–36.0)
Platelets: 176 10*3/uL (ref 150–400)
RBC: 1.68 MIL/uL — ABNORMAL LOW (ref 4.22–5.81)

## 2013-06-04 NOTE — Progress Notes (Signed)
Pt c/o difficulty urinating, and feeling of being unable to empty bladder. Pt able to void small amounts at a time. Bladder scanned performed and found to be holding approximately 410 ml. NP on call was notified and new orders received. Will continue to monitor.

## 2013-06-04 NOTE — Progress Notes (Signed)
PHARMACIST - PHYSICIAN ORDER COMMUNICATION  CONCERNING: P[employer] Medication Policy on Oral Chemo Medications  DESCRIPTION:  This patient's order for Hydroxyurea has been noted.  Hydroxyurea from home was resumed 06/04/13, but today, pharmacy is holding this med according to the oral chemotherapy policy. Patient met one of the following hold medication-specific conditions.   Hydroxyurea (Hydrea) hold criteria  ANC < 2  Pltc < 80K in sickle-cell patients; < 100K in other patients  Hgb <= 6 in sickle-cell patients; < 8 in other patients  Reticulocytes < 80K when Hgb < 9  ACTION TAKEN: Hydroxyurea order has been discontinued. Please reassess and resume when resolved.   Lynann Beaver PharmD, BCPS Pager [phone] 06/04/2013 11:21 AM

## 2013-06-04 NOTE — Progress Notes (Addendum)
[facility] PROGRESS NOTE  Dennis Bernard ZOX:[id number] DOB: Jan 26, 1986 DOA: 05/28/2013 PCP: No primary provider on file.  Assessment/Plan: Fever - Chest x-ray negative for infiltrate - Urinalysis negative for pyuria - Blood cultures negative to date - Continue empiric antibiotics - Further workup if fever returns Sickle cell pain crisis with backache, chronic pain and fatigue  -overall pain has improved to baseline -appreciate Dr. Cyndie Chime followup  -Pain management: Continue long acting medications at usual home dose: MS Contin 60 mg every 12 hours. Avoid increasing dose further to prevent further issues with tolerance.  -discontinued PCA Dilaudid(05/31/2013)  - 05/31/13--Start intermittent dosing IV hydromorphone 3mg  every 2 hours (patient has been averaging 1.25-1.5 mg every hour)  -pain remained stable on intermitten dosing of IV hydromorphone  - 06/01/2013--start PO Dilaudid 4 mg every 4 hours  -decrease IV hydromorphone 2 mg IV every 4 hours when necessary breakthrough pain  - This was discussed with the patient and he is agreeable to the adjustment  -Toradol IV Q 6 hours for anti-inflammatory effect. Monitor renal function.  -Adjuvant pain therapy: Neurontin 100 mg twice a day.   -Monitor CBC (add differential if on Hydrea) Q day until stable.  -Monitor bilirubin/LDH Q 72 hours to monitor hemolysis. Current bilirubin: 0.6, LDH 578--recheck on 06/01/13  - 06/01/2013 LDH 337, reticulocyte count 17.7, total bilirubin 1.2  -06/02/13 LDH-->321  -Reticulocyte count 16.4% at time of admission, no evidence of aplastic anemia. Coombs test not indicative of delayed transfusion reaction.  -Ferritin level last checked 03/23/2013, and was 618. No indication for Desferal.  -On Hydrea routinely. Will resume.  -K pad PRN.  -Continue folic acid.  - Patient was given a prescription for Dilaudid 4 mg, #20, 1 by mouth every 4 hours when necessary pain, no refills;  MS Contin 60 mg,  #12, 1 every 12 hours when necessary pain no refills Active problems:  Hyperkalemia  -Change IV fluids to normal saline without potassium  -Kayexalate x1 dose  Constipation induced by narcotics  -MiraLAX twice a day.  -Increase mobilization as tolerated.  -add senakot  - had BM with lactulose  Chronic anemia  -Patient's baseline hemoglobin is: 7-8 mg/dL.  -Patient's transfusion hemoglobin threshold is: Less than 6 mg/dL, but will only transfuse for a hemoglobin of< 5 mg/dL per hematology recommendations.  -Current hemoglobin remained stable in 5 range status post 2 units of packed red blood cells 05/29/13. Has multiple antibodies which have not allowed timely administration of blood products. Given the least incompatible blood available.  -Transfused 2 additional units of PRBCs per Dr. Cyndie Chime (9/19)  -Although LDH and bilirubin are increased, previous direct Coombs test was negative for posttransfusion hemolysis  -Recheck in the morning  Family Communication: Dennis Bernard at beside  Disposition Plan: Home when medically stable     Procedures/Studies: Dg Chest 1 View  05/14/2013   *RADIOLOGY REPORT*  Clinical Data: Evaluate line placement  CHEST - 1 VIEW  Comparison: Prior radiograph from 02/18/2013  Findings: There has been interval placement of a right IJ power line with tip located in the distal SVC.  Previously seen left- sided central venous port has been removed.  Cardiac and mediastinal silhouettes are unchanged.  The lungs are hypoinflated.  Mild diffuse prominence of the bronchovascular markings may be secondary to hypoinflation.  No definite focal infiltrate identified.  No pleural effusion or pulmonary edema.  No pneumothorax.  Left shoulder arthroplasty again noted.  IMPRESSION:  Tip of right IJ power line in the distal SVC.  No pneumothorax.   Original Report Authenticated By: Rise Mu, M.D.   Dg Chest 2 View  06/03/2013   CLINICAL DATA:  Shortness of breath and sickle  cell disease  EXAM: CHEST  2 VIEW  COMPARISON:  May 28, 2013  FINDINGS: Central catheter tip is in the superior vena cava. No pneumothorax.  Lungs are clear. Heart is borderline prominent with normal pulmonary vascularity. No adenopathy.  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  There are multiple endplate infarcts in the thoracic and visualized lumbar regions consistent with known sickle cell disease.  IMPRESSION: In and plate infarcts in the spine. Status post shoulder replacement on the left. Central catheter tip in superior vena cava. No edema or consolidation.   Electronically Signed   By: Bretta Bang   On: 06/03/2013 15:34   Dg Cervical Spine 2 Or 3 Views  05/15/2013   *RADIOLOGY REPORT*  Clinical Data: Neck pain  CERVICAL SPINE - 2-3 VIEW  Comparison: None.  Findings: Seven cervical segments are well visualized.  Vertebral body height is well-maintained.  There are no changes to suggest acute fracture.  No soft tissue changes are seen.  The odontoid is within normal limits.  A right-sided PICC line is seen at the cavoatrial junction.  No other focal abnormality is noted.  IMPRESSION: No acute abnormalities seen.   Original Report Authenticated By: Alcide Clever, M.D.   Dg Lumbar Spine Complete  05/17/2013   *RADIOLOGY REPORT*  Clinical Data: Back pain for 4 days  LUMBAR SPINE - COMPLETE 4+ VIEW  Comparison: 08/26/2012  Findings: Normal alignment with no fracture.  Again noted is mild endplate sclerosis and depression consistent with sickle cell change.  Osteosclerosis of the sacrum stable.  Sclerotic lesion right femoral head possibly avascular necrosis, stable.  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  No acute findings in the lumbar spine.   Original Report Authenticated By: Esperanza Heir, M.D.   Dg Chest Port 1 View  05/28/2013   CLINICAL DATA:  Sickle cell crisis. Weakness.  EXAM: PORTABLE CHEST -  1 VIEW  COMPARISON:  Chest x-ray 05/14/2013.  FINDINGS: Lordotic positioning. Right internal jugular central venous catheter with tip terminating in the proximal the mid superior vena cava, approximately 3 cm above the position noted on the prior examination. Lung volumes are low. No acute consolidative airspace disease. No pleural effusions. No evidence of pulmonary edema. Heart size is borderline enlarged. Upper mediastinal contours are unremarkable.  IMPRESSION: 1. Low lung volumes without radiographic evidence of acute cardiopulmonary disease.  2. Position of right internal jugular central venous catheter appears higher than the prior study. Although this may partially related to lordotic positioning on today's examination, clinical correlation to exclude catheter migration is recommended.   Electronically Signed   By: Trudie Reed M.D.   On: 05/28/2013 20:21         Subjective: Patient denies dizziness, chest pain, shortness breath, coughing, hemoptysis, nausea, vomiting, diarrhea, abdominal pain. He had loose stools with Kayexalate yesterday.  Objective: Filed Vitals:   06/03/13 2205 06/04/13 0057 06/04/13 0534 06/04/13 1359  BP:   101/59 109/58  Pulse:   78 97  Temp: 101.9 F (38.8 C) 98.2 F (36.8 C) 98 F (36.7 C) 97.8 F (36.6 C)  TempSrc: Oral Oral Oral Oral  Resp:   16 18  Height:      Weight:      SpO2:   100%  100%    Intake/Output Summary (Last 24 hours) at 06/04/13 1602 Last data filed at 06/04/13 0700  Gross per 24 hour  Intake   1420 ml  Output    190 ml  Net   1230 ml   Weight change:  Exam:   General:  Dennis Bernard is alert, follows commands appropriately, not in acute distress  HEENT: No icterus, No thrush, [place]/AT  Cardiovascular: RRR, S1/S2, no rubs, no gallops  Respiratory: Diminished breath sounds at the bases but clear to auscultation. No wheezing.  Abdomen: Soft/+BS, non tender, non distended, no guarding  Extremities: No edema, No lymphangitis, No  petechiae, No rashes, no synovitis  Data Reviewed: Basic Metabolic Panel:  Recent Labs Lab 05/28/13 2007 05/30/13 0540 06/01/13 0534 06/03/13 0830 06/04/13 0515  NA 133* 138 138 134* 136  K 3.4* 4.3 4.8 5.5* 4.4  CL 99 107 107 104 105  CO2 24 23 24 23 23   GLUCOSE 101* 93 89 90 97  BUN 19 14 10 18 21   CREATININE 1.13 1.07 0.95 0.98 1.01  CALCIUM 8.9 8.8 8.7 9.1 9.0   Liver Function Tests:  Recent Labs Lab 05/28/13 2007 05/30/13 0540 06/01/13 0534 06/03/13 0830 06/04/13 0515  AST 22 22 16  37 29  ALT 12 11 10 17 14   ALKPHOS 80 71 74 82 81  BILITOT 0.6 3.1* 1.2 5.0* 4.5*  PROT 7.3 6.3 6.2 6.8 6.7  ALBUMIN 4.0 3.4* 3.1* 3.4* 3.5   No results found for this basename: LIPASE, AMYLASE,  in the last 168 hours No results found for this basename: AMMONIA,  in the last 168 hours CBC:  Recent Labs Lab 05/28/13 2007 05/29/13 0612  05/31/13 0510 06/01/13 1025 06/02/13 0445 06/03/13 0830 06/04/13 0515  WBC 11.8* 14.2*  < > 7.6 5.2 5.9 7.7 7.5  NEUTROABS 8.1* 4.1  --   --   --  2.7  --   --   HGB 3.7* 3.4*  < > 5.1* 5.4* 5.1* 6.8* 5.9*  HCT 10.8* 10.1*  < > 15.5* 16.0* 15.0* 20.7* 17.0*  MCV 108.0* 111.0*  < > 109.9* 105.3* 104.2* 98.1 101.2*  PLT 433* 366  < > 304 296 253 211 176  < > = values in this interval not displayed. Cardiac Enzymes: No results found for this basename: CKTOTAL, CKMB, CKMBINDEX, TROPONINI,  in the last 168 hours BNP: No components found with this basename: POCBNP,  CBG: No results found for this basename: GLUCAP,  in the last 168 hours  No results found for this or any previous visit (from the past 240 hour(s)).   Scheduled Meds: . folic acid  1 mg Oral Daily  . gabapentin  100 mg Oral BID  . morphine  60 mg Oral BID  . piperacillin-tazobactam (ZOSYN)  IV  3.375 g Intravenous Q8H  . polyethylene glycol  17 g Oral BID  . senna  2 tablet Oral Daily  . sodium chloride  10-40 mL Intracatheter Q12H   Continuous Infusions: . sodium  chloride 75 mL/hr at 06/04/13 1012     Adithya Difrancesco, DO  [facility] Pager [phone]  If 7PM-7AM, please contact night-coverage [email or web address] Password [email or web address] 06/04/2013, 4:02 PM   LOS: 7 days

## 2013-06-05 ENCOUNTER — Other Ambulatory Visit: Payer: Self-pay | Admitting: Oncology

## 2013-06-05 ENCOUNTER — Telehealth: Payer: Self-pay | Admitting: Oncology

## 2013-06-05 DIAGNOSIS — T80A0XD Non-ABO incompatibility reaction due to transfusion of blood or blood products, unspecified, subsequent encounter: Secondary | ICD-10-CM

## 2013-06-05 DIAGNOSIS — D571 Sickle-cell disease without crisis: Secondary | ICD-10-CM

## 2013-06-05 LAB — TYPE AND SCREEN
Antibody Screen: POSITIVE
Unit division: 0

## 2013-06-05 LAB — CBC
HCT: 14.1 % — ABNORMAL LOW (ref 39.0–52.0)
Hemoglobin: 5 g/dL — CL (ref 13.0–17.0)
MCH: 36.2 pg — ABNORMAL HIGH (ref 26.0–34.0)
MCHC: 35.5 g/dL (ref 30.0–36.0)
MCV: 102.2 fL — ABNORMAL HIGH (ref 78.0–100.0)
Platelets: 191 10*3/uL (ref 150–400)
RBC: 1.38 MIL/uL — ABNORMAL LOW (ref 4.22–5.81)
RDW: 17.3 % — ABNORMAL HIGH (ref 11.5–15.5)
WBC: 8.7 10*3/uL (ref 4.0–10.5)

## 2013-06-05 LAB — COMPREHENSIVE METABOLIC PANEL
ALT: 15 U/L (ref 0–53)
Albumin: 3.7 g/dL (ref 3.5–5.2)
Alkaline Phosphatase: 81 U/L (ref 39–117)
BUN: 21 mg/dL (ref 6–23)
CO2: 23 mEq/L (ref 19–32)
Chloride: 104 mEq/L (ref 96–112)
Potassium: 4.3 mEq/L (ref 3.5–5.1)
Total Bilirubin: 5 mg/dL — ABNORMAL HIGH (ref 0.3–1.2)
Total Protein: 7.2 g/dL (ref 6.0–8.3)

## 2013-06-05 LAB — LACTATE DEHYDROGENASE: LDH: 616 U/L — ABNORMAL HIGH (ref 94–250)

## 2013-06-05 MED ORDER — HEPARIN SOD (PORK) LOCK FLUSH 100 UNIT/ML IV SOLN
250.0000 [IU] | INTRAVENOUS | Status: AC | PRN
  Administered 2013-06-05: 500 [IU]

## 2013-06-05 MED ORDER — MORPHINE SULFATE ER 60 MG PO TBCR: 60.0000 mg | EXTENDED_RELEASE_TABLET | Freq: Two times a day (BID) | ORAL | Status: DC

## 2013-06-05 MED ORDER — HYDROMORPHONE HCL 4 MG PO TABS: 4.0000 mg | ORAL_TABLET | ORAL | Status: DC | PRN

## 2013-06-05 MED ORDER — UNABLE TO FIND: Status: DC

## 2013-06-05 NOTE — Progress Notes (Signed)
He received 2 units of blood on Friday, September 19. These were units felt to be compatible release by [facility] [place]. Unfortunately, it appears that he has had a delayed transfusion reaction to these units. Repeat hemoglobin 6.8 on September 20 up from pretransfusion value 5.1. This has rapidly fallen down to 5 g today. There was a transient direct antiglobulin test positive. Serum LDH which had fallen to 321 has now risen to 616 Bilirubin stable at this point at 5 mg but up from pretransfusion value of 3.1.  Clinically he is stable. His sickle pain is actually decreasing despite evidence of accelerated hemolysis which is more than likely due to the delayed transfusion reaction.  On exam: Temperature not recorded. Pulse 85 regular respirations 22 blood pressure 100/56 Lungs are clear Cardiac: Regular rhythm no murmur Extremities no edema, no calf tenderness Neurologic: Grossly normal  Impression: #1. Sickle cell crisis. Slowly resolving. Patient feels that he could go home on oral analgesics at this point.  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 A delayed hemolytic transfusion reaction secondary to #2.  I think he could potentially go home today if he wants to. I'll have him come to my office on Wednesday to check a blood count. Discussed with hospital attending physician.

## 2013-06-05 NOTE — Telephone Encounter (Signed)
PT SCHEDULED FOR LAB 09/24 @ 8:45 PER [facility] FW/U

## 2013-06-06 LAB — RESPIRATORY VIRUS PANEL
Adenovirus: NOT DETECTED
Influenza A: NOT DETECTED
Influenza B: NOT DETECTED
Metapneumovirus: NOT DETECTED
Parainfluenza 1: NOT DETECTED
Parainfluenza 2: NOT DETECTED
Parainfluenza 3: NOT DETECTED
Respiratory Syncytial Virus B: NOT DETECTED

## 2013-06-06 LAB — URINE CULTURE: Colony Count: NO GROWTH

## 2013-06-07 ENCOUNTER — Other Ambulatory Visit: Payer: Medicare Other | Admitting: Lab

## 2013-06-08 ENCOUNTER — Telehealth: Payer: Self-pay | Admitting: Oncology

## 2013-06-08 NOTE — Telephone Encounter (Signed)
lvm for pt regarding to 10.1.14 appt.Marland KitchenMarland Kitchen

## 2013-06-09 ENCOUNTER — Inpatient Hospital Stay: Payer: Medicare Other | Admitting: Family Medicine

## 2013-06-10 LAB — CULTURE, BLOOD (ROUTINE X 2): Culture: NO GROWTH

## 2013-06-14 ENCOUNTER — Ambulatory Visit: Payer: Medicare Other | Admitting: Nurse Practitioner

## 2013-07-14 ENCOUNTER — Telehealth: Payer: Self-pay | Admitting: Dietician

## 2013-07-14 ENCOUNTER — Encounter (HOSPITAL_COMMUNITY): Payer: Self-pay | Admitting: Emergency Medicine

## 2013-07-14 ENCOUNTER — Inpatient Hospital Stay (HOSPITAL_COMMUNITY)
Admission: EM | Admit: 2013-07-14 | Discharge: 2013-07-29 | DRG: 811 | Disposition: A | Discharge status: Home / Self Care | Payer: Medicare Other | Attending: Internal Medicine | Admitting: Internal Medicine

## 2013-07-14 ENCOUNTER — Emergency Department (HOSPITAL_COMMUNITY): Payer: Medicare Other

## 2013-07-14 DIAGNOSIS — R2232 Localized swelling, mass and lump, left upper limb: Secondary | ICD-10-CM

## 2013-07-14 DIAGNOSIS — R609 Edema, unspecified: Secondary | ICD-10-CM | POA: Diagnosis present

## 2013-07-14 DIAGNOSIS — R5081 Fever presenting with conditions classified elsewhere: Secondary | ICD-10-CM | POA: Diagnosis present

## 2013-07-14 DIAGNOSIS — E876 Hypokalemia: Secondary | ICD-10-CM | POA: Diagnosis present

## 2013-07-14 DIAGNOSIS — R0989 Other specified symptoms and signs involving the circulatory and respiratory systems: Secondary | ICD-10-CM | POA: Diagnosis present

## 2013-07-14 DIAGNOSIS — M87059 Idiopathic aseptic necrosis of unspecified femur: Secondary | ICD-10-CM | POA: Diagnosis present

## 2013-07-14 DIAGNOSIS — D5700 Hb-SS disease with crisis, unspecified: Secondary | ICD-10-CM

## 2013-07-14 DIAGNOSIS — R6510 Systemic inflammatory response syndrome (SIRS) of non-infectious origin without acute organ dysfunction: Secondary | ICD-10-CM

## 2013-07-14 DIAGNOSIS — Z96619 Presence of unspecified artificial shoulder joint: Secondary | ICD-10-CM

## 2013-07-14 DIAGNOSIS — T80A0XS Non-ABO incompatibility reaction due to transfusion of blood or blood products, unspecified, sequela: Secondary | ICD-10-CM

## 2013-07-14 DIAGNOSIS — Z832 Family history of diseases of the blood and blood-forming organs and certain disorders involving the immune mechanism: Secondary | ICD-10-CM

## 2013-07-14 DIAGNOSIS — T80A0XD Non-ABO incompatibility reaction due to transfusion of blood or blood products, unspecified, subsequent encounter: Secondary | ICD-10-CM

## 2013-07-14 DIAGNOSIS — M549 Dorsalgia, unspecified: Secondary | ICD-10-CM

## 2013-07-14 DIAGNOSIS — T4275XA Adverse effect of unspecified antiepileptic and sedative-hypnotic drugs, initial encounter: Secondary | ICD-10-CM | POA: Diagnosis present

## 2013-07-14 DIAGNOSIS — D571 Sickle-cell disease without crisis: Secondary | ICD-10-CM | POA: Diagnosis present

## 2013-07-14 DIAGNOSIS — R509 Fever, unspecified: Secondary | ICD-10-CM | POA: Diagnosis present

## 2013-07-14 DIAGNOSIS — Y9210 Unspecified residential institution as the place of occurrence of the external cause: Secondary | ICD-10-CM | POA: Diagnosis not present

## 2013-07-14 DIAGNOSIS — G8929 Other chronic pain: Secondary | ICD-10-CM

## 2013-07-14 DIAGNOSIS — A419 Sepsis, unspecified organism: Secondary | ICD-10-CM | POA: Diagnosis present

## 2013-07-14 DIAGNOSIS — G894 Chronic pain syndrome: Secondary | ICD-10-CM

## 2013-07-14 DIAGNOSIS — R5381 Other malaise: Secondary | ICD-10-CM

## 2013-07-14 DIAGNOSIS — Z87898 Personal history of other specified conditions: Secondary | ICD-10-CM

## 2013-07-14 DIAGNOSIS — K5909 Other constipation: Secondary | ICD-10-CM | POA: Diagnosis present

## 2013-07-14 DIAGNOSIS — F1120 Opioid dependence, uncomplicated: Secondary | ICD-10-CM

## 2013-07-14 DIAGNOSIS — D72829 Elevated white blood cell count, unspecified: Secondary | ICD-10-CM | POA: Diagnosis present

## 2013-07-14 DIAGNOSIS — Y849 Medical procedure, unspecified as the cause of abnormal reaction of the patient, or of later complication, without mention of misadventure at the time of the procedure: Secondary | ICD-10-CM | POA: Diagnosis not present

## 2013-07-14 DIAGNOSIS — Z79899 Other long term (current) drug therapy: Secondary | ICD-10-CM

## 2013-07-14 DIAGNOSIS — J209 Acute bronchitis, unspecified: Secondary | ICD-10-CM | POA: Diagnosis present

## 2013-07-14 DIAGNOSIS — T8089XA Other complications following infusion, transfusion and therapeutic injection, initial encounter: Secondary | ICD-10-CM | POA: Diagnosis not present

## 2013-07-14 DIAGNOSIS — Z9114 Patient's other noncompliance with medication regimen: Secondary | ICD-10-CM

## 2013-07-14 LAB — COMPREHENSIVE METABOLIC PANEL
ALT: 17 U/L (ref 0–53)
AST: 29 U/L (ref 0–37)
Albumin: 4.7 g/dL (ref 3.5–5.2)
Alkaline Phosphatase: 106 U/L (ref 39–117)
BUN: 17 mg/dL (ref 6–23)
CO2: 19 mEq/L (ref 19–32)
Calcium: 10 mg/dL (ref 8.4–10.5)
Chloride: 106 mEq/L (ref 96–112)
Creatinine, Ser: 1.11 mg/dL (ref 0.50–1.35)
GFR calc Af Amer: >90 mL/min (ref >90)
GFR calc non Af Amer: 89 mL/min — ABNORMAL LOW (ref >90)
Glucose, Bld: 97 mg/dL (ref 70–99)
Potassium: 3.8 mEq/L (ref 3.5–5.1)
Sodium: 137 mEq/L (ref 135–145)
Total Bilirubin: 1.4 mg/dL — ABNORMAL HIGH (ref 0.3–1.2)
Total Protein: 8.7 g/dL — ABNORMAL HIGH (ref 6.0–8.3)

## 2013-07-14 LAB — CBC WITH DIFFERENTIAL/PLATELET
Basophils Absolute: 0.1 10*3/uL (ref 0.0–0.1)
Basophils Relative: 1 % (ref 0–1)
Eosinophils Absolute: 0 10*3/uL (ref 0.0–0.7)
Eosinophils Relative: 0 % (ref 0–5)
HCT: 22 % — ABNORMAL LOW (ref 39.0–52.0)
Hemoglobin: 7.5 g/dL — ABNORMAL LOW (ref 13.0–17.0)
Lymphocytes Relative: 23 % (ref 12–46)
Lymphs Abs: 3.1 10*3/uL (ref 0.7–4.0)
MCH: 33.6 pg (ref 26.0–34.0)
MCHC: 34.1 g/dL (ref 30.0–36.0)
MCV: 98.7 fL (ref 78.0–100.0)
Monocytes Absolute: 1.2 10*3/uL — ABNORMAL HIGH (ref 0.1–1.0)
Monocytes Relative: 10 % (ref 3–12)
Neutro Abs: 8.7 10*3/uL — ABNORMAL HIGH (ref 1.7–7.7)
Neutrophils Relative %: 66 % (ref 43–77)
Platelets: 593 10*3/uL — ABNORMAL HIGH (ref 150–400)
RBC: 2.23 MIL/uL — ABNORMAL LOW (ref 4.22–5.81)
RDW: 18.2 % — ABNORMAL HIGH (ref 11.5–15.5)
WBC: 13.1 10*3/uL — ABNORMAL HIGH (ref 4.0–10.5)

## 2013-07-14 LAB — URINALYSIS, ROUTINE W REFLEX MICROSCOPIC
Bilirubin Urine: NEGATIVE
Glucose, UA: NEGATIVE mg/dL
Hgb urine dipstick: NEGATIVE
Ketones, ur: NEGATIVE mg/dL
Leukocytes, UA: NEGATIVE
Nitrite: NEGATIVE
Protein, ur: NEGATIVE mg/dL
Specific Gravity, Urine: 1.021 (ref 1.005–1.030)
Urobilinogen, UA: 0.2 mg/dL (ref 0.0–1.0)
pH: 5.5 (ref 5.0–8.0)

## 2013-07-14 LAB — RETICULOCYTES
RBC.: 2.23 MIL/uL — ABNORMAL LOW (ref 4.22–5.81)
Retic Count, Absolute: 468.3 10*3/uL — ABNORMAL HIGH (ref 19.0–186.0)
Retic Ct Pct: 21 % — ABNORMAL HIGH (ref 0.4–3.1)

## 2013-07-14 MED ORDER — SODIUM CHLORIDE 0.9 % IV BOLUS (SEPSIS)
500.0000 mL | Freq: Once | INTRAVENOUS | Status: AC
  Administered 2013-07-14: 500 mL via INTRAVENOUS

## 2013-07-14 MED ORDER — ALBUTEROL SULFATE HFA 108 (90 BASE) MCG/ACT IN AERS: 1.0000 | INHALATION_SPRAY | Freq: Four times a day (QID) | RESPIRATORY_TRACT | Status: DC | PRN

## 2013-07-14 MED ORDER — FLUTICASONE PROPIONATE 50 MCG/ACT NA SUSP
1.0000 | Freq: Every day | NASAL | Status: DC
  Administered 2013-07-15 – 2013-07-29 (×15): 1 via NASAL
  Filled 2013-07-14: qty 16

## 2013-07-14 MED ORDER — SODIUM CHLORIDE 0.45 % IV SOLN
INTRAVENOUS | Status: DC
  Administered 2013-07-14 – 2013-07-19 (×6): via INTRAVENOUS
  Administered 2013-07-20: 500 mL via INTRAVENOUS
  Administered 2013-07-20: 10:00:00 via INTRAVENOUS
  Administered 2013-07-21: 1000 mL via INTRAVENOUS
  Administered 2013-07-21: 12:00:00 via INTRAVENOUS
  Administered 2013-07-22: 20 mL/h via INTRAVENOUS
  Administered 2013-07-22: 1000 mL via INTRAVENOUS
  Administered 2013-07-24 – 2013-07-25 (×2): via INTRAVENOUS
  Administered 2013-07-26: 500 mL via INTRAVENOUS
  Administered 2013-07-27 – 2013-07-29 (×3): via INTRAVENOUS

## 2013-07-14 MED ORDER — KETOROLAC TROMETHAMINE 30 MG/ML IJ SOLN
30.0000 mg | Freq: Once | INTRAMUSCULAR | Status: AC
  Administered 2013-07-14: 30 mg via INTRAVENOUS

## 2013-07-14 MED ORDER — HYDROMORPHONE HCL PF 2 MG/ML IJ SOLN
2.0000 mg | Freq: Once | INTRAMUSCULAR | Status: AC
  Administered 2013-07-14: 2 mg via INTRAVENOUS

## 2013-07-14 MED ORDER — DIPHENHYDRAMINE HCL 50 MG/ML IJ SOLN
INTRAMUSCULAR | Status: AC
  Filled 2013-07-14: qty 1

## 2013-07-14 MED ORDER — MORPHINE SULFATE ER 60 MG PO TBCR
60.0000 mg | EXTENDED_RELEASE_TABLET | Freq: Two times a day (BID) | ORAL | Status: DC
  Administered 2013-07-14 – 2013-07-29 (×30): 60 mg via ORAL
  Filled 2013-07-14 (×30): qty 2

## 2013-07-14 MED ORDER — DIPHENHYDRAMINE HCL 50 MG/ML IJ SOLN
25.0000 mg | Freq: Once | INTRAMUSCULAR | Status: AC
Start: 2013-07-14 — End: 2013-07-14
  Administered 2013-07-14: 25 mg via INTRAVENOUS
  Filled 2013-07-14: qty 1

## 2013-07-14 MED ORDER — DIPHENHYDRAMINE HCL 50 MG/ML IJ SOLN
12.5000 mg | Freq: Four times a day (QID) | INTRAMUSCULAR | Status: DC | PRN
  Administered 2013-07-14 – 2013-07-17 (×8): 12.5 mg via INTRAVENOUS
  Filled 2013-07-14 (×9): qty 1

## 2013-07-14 MED ORDER — FOLIC ACID 1 MG PO TABS
1.0000 mg | ORAL_TABLET | Freq: Every morning | ORAL | Status: DC
  Administered 2013-07-15 – 2013-07-29 (×15): 1 mg via ORAL
  Filled 2013-07-14 (×15): qty 1

## 2013-07-14 MED ORDER — SENNOSIDES-DOCUSATE SODIUM 8.6-50 MG PO TABS
1.0000 | ORAL_TABLET | Freq: Two times a day (BID) | ORAL | Status: DC
  Administered 2013-07-14 – 2013-07-29 (×30): 1 via ORAL
  Filled 2013-07-14 (×42): qty 1

## 2013-07-14 MED ORDER — ONDANSETRON HCL 4 MG/2ML IJ SOLN
4.0000 mg | INTRAMUSCULAR | Status: DC | PRN
  Administered 2013-07-21 – 2013-07-24 (×3): 4 mg via INTRAVENOUS
  Filled 2013-07-14 (×3): qty 2

## 2013-07-14 MED ORDER — SODIUM CHLORIDE 0.9 % IJ SOLN: 9.0000 mL | INTRAMUSCULAR | Status: DC | PRN

## 2013-07-14 MED ORDER — HYDROMORPHONE HCL PF 2 MG/ML IJ SOLN
3.0000 mg | Freq: Once | INTRAMUSCULAR | Status: AC
  Administered 2013-07-14: 3 mg via INTRAVENOUS
  Filled 2013-07-14: qty 2

## 2013-07-14 MED ORDER — KETOROLAC TROMETHAMINE 30 MG/ML IJ SOLN
30.0000 mg | Freq: Four times a day (QID) | INTRAMUSCULAR | Status: AC
  Administered 2013-07-14 – 2013-07-19 (×20): 30 mg via INTRAVENOUS
  Filled 2013-07-14 (×21): qty 1

## 2013-07-14 MED ORDER — DIPHENHYDRAMINE HCL 12.5 MG/5ML PO ELIX
12.5000 mg | ORAL_SOLUTION | Freq: Four times a day (QID) | ORAL | Status: DC | PRN
  Administered 2013-07-16: 12.5 mg via ORAL
  Filled 2013-07-14: qty 5

## 2013-07-14 MED ORDER — HYDROMORPHONE 0.3 MG/ML IV SOLN
INTRAVENOUS | Status: DC
  Administered 2013-07-14: 18:00:00 via INTRAVENOUS
  Filled 2013-07-14: qty 25

## 2013-07-14 MED ORDER — HYDROMORPHONE HCL PF 2 MG/ML IJ SOLN
INTRAMUSCULAR | Status: AC
  Filled 2013-07-14: qty 1

## 2013-07-14 MED ORDER — ONDANSETRON HCL 4 MG/2ML IJ SOLN
4.0000 mg | Freq: Four times a day (QID) | INTRAMUSCULAR | Status: DC | PRN
  Administered 2013-07-20: 4 mg via INTRAVENOUS
  Filled 2013-07-14: qty 2

## 2013-07-14 MED ORDER — HYDROMORPHONE HCL PF 2 MG/ML IJ SOLN
INTRAMUSCULAR | Status: AC
  Filled 2013-07-14: qty 2

## 2013-07-14 MED ORDER — HYDROMORPHONE 0.3 MG/ML IV SOLN
INTRAVENOUS | Status: DC
  Administered 2013-07-14: 6.39 mg via INTRAVENOUS
  Administered 2013-07-14: 21:00:00 via INTRAVENOUS
  Administered 2013-07-14: 6.9 mg via INTRAVENOUS
  Administered 2013-07-15: 11:00:00 via INTRAVENOUS
  Administered 2013-07-15: 4.79 mg via INTRAVENOUS
  Administered 2013-07-15: 1.09 mg via INTRAVENOUS
  Administered 2013-07-15 (×4): via INTRAVENOUS
  Administered 2013-07-15: 16.04 mg via INTRAVENOUS
  Administered 2013-07-15: 23:00:00 via INTRAVENOUS
  Administered 2013-07-15: 2.67 mg via INTRAVENOUS
  Administered 2013-07-15: 16:00:00 via INTRAVENOUS
  Administered 2013-07-16: 7.6 mg via INTRAVENOUS
  Administered 2013-07-16: 05:00:00 via INTRAVENOUS
  Administered 2013-07-16: 9.03 mg via INTRAVENOUS
  Administered 2013-07-16: 11.29 mg via INTRAVENOUS
  Administered 2013-07-16 (×2): via INTRAVENOUS
  Filled 2013-07-14 (×11): qty 25

## 2013-07-14 MED ORDER — ENOXAPARIN SODIUM 40 MG/0.4ML ~~LOC~~ SOLN
40.0000 mg | SUBCUTANEOUS | Status: DC
  Administered 2013-07-14 – 2013-07-26 (×13): 40 mg via SUBCUTANEOUS
  Filled 2013-07-14 (×14): qty 0.4

## 2013-07-14 MED ORDER — HYDROXYUREA 500 MG PO CAPS
2000.0000 mg | ORAL_CAPSULE | Freq: Every morning | ORAL | Status: DC
  Administered 2013-07-15 – 2013-07-19 (×5): 2000 mg via ORAL
  Filled 2013-07-14 (×6): qty 4

## 2013-07-14 MED ORDER — ONDANSETRON HCL 4 MG PO TABS
4.0000 mg | ORAL_TABLET | ORAL | Status: DC | PRN
  Administered 2013-07-20: 4 mg via ORAL
  Filled 2013-07-14: qty 1

## 2013-07-14 MED ORDER — NALOXONE HCL 0.4 MG/ML IJ SOLN: 0.4000 mg | INTRAMUSCULAR | Status: DC | PRN

## 2013-07-14 MED ORDER — DIPHENHYDRAMINE HCL 25 MG PO CAPS: 25.0000 mg | ORAL_CAPSULE | Freq: Three times a day (TID) | ORAL | Status: DC | PRN

## 2013-07-14 MED ORDER — DIPHENHYDRAMINE HCL 50 MG/ML IJ SOLN
25.0000 mg | Freq: Once | INTRAMUSCULAR | Status: AC
  Administered 2013-07-14: 25 mg via INTRAVENOUS

## 2013-07-14 MED ORDER — HYDROMORPHONE HCL PF 2 MG/ML IJ SOLN
3.0000 mg | Freq: Once | INTRAMUSCULAR | Status: AC
  Administered 2013-07-14: 3 mg via INTRAVENOUS

## 2013-07-14 MED ORDER — KETOROLAC TROMETHAMINE 30 MG/ML IJ SOLN
INTRAMUSCULAR | Status: AC
  Filled 2013-07-14: qty 1

## 2013-07-14 NOTE — H&P (Signed)
[facility] History and Physical  Naeem Quillin NWG:[id number] DOB: 01/29/1986 DOA: 07/14/2013  Referring physician: Dr. Raeford Razor PCP: Dr. Craig Staggers, [facility]  Chief Complaint: Back pain and fever.   History of Present Illness: Dennis Bernard is an 27 y.o. male Dennis Bernard is an 27 y.o. male with a PMH of sickle cell disease with multiple hospitalizations who presents to the hospital with a 2 day history of back pain radiating up to his left shoulder and neck along with some associated fevers and nasal congestion.  The pain is similar to his usual sickle cell pain.  He tried taking his OxyContin and PO Dilaudid, but this has not alleviated the pain, and therefore he came to the ER for further evaluation.  Upon initial evaluation, his hemoglobin was found to be 7.5mg /dL.  Baseline hemoglobin around 9-10 mg/dL.  Reticulocytes are up, and bilirubin is 1.4.  Review of Systems: Constitutional: + fever, no chills;  Appetite diminished x 2 days; No weight loss, no weight gain, + fatigue.  HEENT: No blurry vision, no diplopia, no pharyngitis, no dysphagia CV: No chest pain, no palpitations, no PND.  Resp: No SOB, + dry cough, no pleuritic pain. GI: + nausea, no vomiting, no diarrhea, no melena, no hematochezia, no constipation.  GU: No dysuria, no hematuria, no frequency, no urgency. MSK: + myalgias, + arthralgias.  Neuro:  + headache, no focal neurological deficits, no history of seizures.  Psych: No depression, no anxiety.  Endo: No heat intolerance, no cold intolerance, no polyuria, no polydipsia  Skin: No rashes, no skin lesions.  Heme: No easy bruising.  Travel history: None in the past 6 months.    Past Medical History Past Medical History  Diagnosis Date  . Sickle cell disease   . Avascular necrosis of femur head, right   . H/O allergic rhinitis   . H/O hypokalemia   . Chronic pain syndrome   . H/O wheezing     with colds  . Non-ABO incompatible blood transfusion  05/30/2013     Past Surgical History Past Surgical History  Procedure Laterality Date  . Cholecystectomy  1995  . Portacath placement Left 07/04/2012  . Exchange transfusion  02/2008    perioperatively for shoulder surgery  . Total shoulder replacement Left 04/09/2008     Social History: History   Social History  . Marital Status: Single    Spouse Name: N/A    Number of Children: 0  . Years of Education: N/A   Occupational History  . Student    Social History Main Topics  . Smoking status: Never Smoker   . Smokeless tobacco: Never Used  . Alcohol Use: No  . Drug Use: No  . Sexual Activity: No   Other Topics Concern  . Not on file   Social History Narrative   Patient lives in [place].   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   Is from [place].   Usually followed at [facility] for his sickle cell disease.  Primary hematologist Dr. James Ivanoff at [facility].               Family History:  Family History  Problem Relation Age of Onset  . Sickle cell anemia Brother   . Sickle cell trait Father   . Sickle cell trait Mother   . Diabetes Mellitus II Mother   . Sickle cell anemia Paternal Uncle   . Asthma Neg Hx   . Allergic rhinitis Neg Hx     Allergies: Review of  patient's allergies indicates no known allergies.  Meds: Prior to Admission medications   Medication Sig Start Date End Date Taking? Authorizing Provider  albuterol (PROVENTIL HFA;VENTOLIN HFA) 108 (90 BASE) MCG/ACT inhaler Inhale 1 puff into the lungs every 6 (six) hours as needed for wheezing or shortness of breath. wheezing 11/27/12  Yes Vassie Loll, MD  diphenhydrAMINE (BENADRYL) 25 MG tablet Take 25 mg by mouth every 8 (eight) hours as needed for itching.  08/23/12  Yes Dow Adolph, MD  fluticasone [facility]) 50 MCG/ACT nasal spray Place 1 spray into the nose daily. 05/19/13  Yes Kathlen Mody, MD  folic acid (FOLVITE) 1 MG tablet Take 1 mg by mouth every morning. 11/27/12  Yes Vassie Loll, MD  HYDROmorphone  (DILAUDID) 4 MG tablet Take 1 tablet (4 mg total) by mouth every 4 (four) hours as needed for pain. 06/05/13  Yes Catarina Hartshorn, MD  hydroxyurea (HYDREA) 500 MG capsule Take 2,000 mg by mouth every morning. May take with food to minimize GI side effects. 11/27/12  Yes Vassie Loll, MD  morphine (MS CONTIN) 60 MG 12 hr tablet Take 1 tablet (60 mg total) by mouth 2 (two) times daily. 06/05/13  Yes Catarina Hartshorn, MD  promethazine (PHENERGAN) 25 MG tablet Take 1 tablet (25 mg total) by mouth every 6 (six) hours as needed for nausea. 12/12/12  Yes Alinda Money, MD  sennosides-docusate sodium (SENOKOT-S) 8.6-50 MG tablet Take 1 tablet by mouth 2 (two) times daily. 02/14/13  Yes Altha Harm, MD    Physical Exam: Filed Vitals:   07/14/13 1243  BP: 109/77  Pulse: 119  Temp: 99.8 F (37.7 C)  TempSrc: Oral  Resp: 16  Height: 5\' 9"  (1.753 m)  Weight: 72.576 kg (160 lb)  SpO2: 98%     Physical Exam: Blood pressure 109/77, pulse 119, temperature 99.8 F (37.7 C), temperature source Oral, resp. rate 16, height 5\' 9"  (1.753 m), weight 72.576 kg (160 lb), SpO2 98.00%. Gen: No acute distress. Head: Normocephalic, atraumatic. Eyes: PERRL, EOMI, sclerae nonicteric. Mouth: Oropharynx clear. Neck: Supple, no thyromegaly, no lymphadenopathy, no jugular venous distention. Chest: Lungs CTAB. CV: Heart sounds are regular.  III/IV SEM. Abdomen: Soft, nontender, nondistended with normal active bowel sounds. Extremities: Extremities are without C/E/C. Skin: Warm and dry. Neuro: Alert and oriented times 3; cranial nerves II through XII grossly intact. Psych: Mood and affect normal.  Labs on Admission:  Basic Metabolic Panel:  Recent Labs Lab 07/14/13 1338  NA 137  K 3.8  CL 106  CO2 19  GLUCOSE 97  BUN 17  CREATININE 1.11  CALCIUM 10.0   Liver Function Tests:  Recent Labs Lab 07/14/13 1338  AST 29  ALT 17  ALKPHOS 106  BILITOT 1.4*  PROT 8.7*  ALBUMIN 4.7   CBC:  Recent Labs Lab  07/14/13 1338  WBC 13.1*  NEUTROABS 8.7*  HGB 7.5*  HCT 22.0*  MCV 98.7  PLT 593*    Radiological Exams on Admission: Dg Chest 2 View  07/14/2013   CLINICAL DATA:  sickle cell disease with pain  EXAM: CHEST  2 VIEW  COMPARISON:  June 03, 2013  FINDINGS: The central catheter is been removed. There is no edema or consolidation. Heart is mildly enlarged with normal pulmonary vascularity. No adenopathy. No pneumothorax.  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 There are multiple endplate the infarcts in the thoracic and lumbar spine consistent with known sickle cell disease.  IMPRESSION: No edema or consolidation.  Stable cardiac prominence. Bony changes present consistent with known sickle cell disease.   Electronically Signed   By: Bretta Bang M.D.   On: 07/14/2013 14:05   Assessment/Plan Principal Problem: Sickle cell pain crisis / backache -Pain management: Continue long acting medications at usual home dose: MS Contin 60 mg Q 12 hours.  Avoid increasing dose further to prevent further issues with tolerance. -Custom dose PCA dilaudid while in acute crisis. 6 mg/4 hour lock out. -Toradol IV Q 6 hours for anti-inflammatory effect.  Monitor renal function. -Adjuvant pain therapy: None. -Wean IV narcotics when pain rated 7/10.  -When able to tolerate oral therapy, convert at 50-75% of IV dose & give PRNs Q 2-3 hours. -Monitor CBC (add differential if on Hydrea) Q 2-3 day(s). -Monitor bilirubin/LDH Q 72 hours to monitor hemolysis.  Current bilirubin: 1.4, LDH not checked, check. -No evidence of aplastic anemia. -Ferritin level last checked 03/23/13, and was 618.   -Continue Hydrea.   -Continue IVF: 1/2 NS at 50 cc/hr.  Volume status: Slightly dry.  D/C IVF when hydrated. -K pad PRN. -Continue folic acid. Active problems: Constipation induced by narcotics -Senna-S for pro motility effect. -Increase mobilization as tolerated. Sickle cell anemia -Patient's baseline  hemoglobin is: 9-1-mg/dL -Patient's transfusion hemoglobin threshold is: 5-6 mg/dL, history of delayed transfusion reaction. -Current hemoglobin is: 7.5 mg/dL. Fever -Blood culture x 2 requested.   Code Status: Full. Family Communication: No family at bedside. Disposition Plan: Home when stable.  Time spent: 1 hour.  Person Ducre [facility] Pager [phone]  If 7PM-7AM, please contact night-coverage [email or web address] Password [email or web address] 07/14/2013, 4:59 PM

## 2013-07-14 NOTE — ED Notes (Signed)
Pt comes in from home with sickle cell crisis pain that starts in his back that goes up to his neck. Pt states that he has also been nauseaous but denies vomiting or diarrhea; pt also states that he has been having fevers tmax 101. Pt states that he has taken OTC tylenol and his prescribed medications without any relief.

## 2013-07-14 NOTE — ED Provider Notes (Signed)
CSN: [id number]     Arrival date & time 07/14/13  1234 History   First MD Initiated Contact with Patient 07/14/13 1319     Chief Complaint  Patient presents with  . Sickle Cell Pain Crisis   (Consider location/radiation/quality/duration/timing/severity/associated sxs/prior Treatment) HPI  27 year old male with back pain. Onset yesterday. Atraumatic. Patient has a past history of sickle cell anemia. Reports the current symptoms feel similar to prior vaso-occlusive crises. Also fever to 101. What patient was little in terms of other complaints. Denies any chest pain or shortness of breath. No cough. No rash. No joint swelling. No urinary complaints. No vomiting or diarrhea. No sick contacts. Patient has been taking MS Contin for pain and tylenol for fever.  Past Medical History  Diagnosis Date  . Sickle cell disease   . Avascular necrosis of femur head, right   . H/O allergic rhinitis   . H/O hypokalemia   . Chronic pain syndrome   . H/O wheezing     with colds  . 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 Cholecystectomy  1995  . Portacath placement Left 07/04/2012  . Exchange transfusion  02/2008    perioperatively for shoulder surgery  . 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 Sickle cell anemia Brother   . Sickle cell trait Father   . Sickle cell trait Mother   . Diabetes Mellitus II Mother   . Sickle cell anemia Paternal Uncle   . Asthma Neg Hx   . Allergic rhinitis Neg Hx    History  Substance Use Topics  . Smoking status: Never Smoker   . Smokeless tobacco: Never Used  . Alcohol Use: No    Review of Systems  All systems reviewed and negative, other than as noted in HPI.  Allergies  Review of patient's allergies indicates no known allergies.  Home Medications   Current Outpatient Rx  Name  Route  Sig  Dispense  Refill  . albuterol (PROVENTIL  HFA;VENTOLIN HFA) 108 (90 BASE) MCG/ACT inhaler   Inhalation   Inhale 1 puff into the lungs every 6 (six) hours as needed for wheezing or shortness of breath. wheezing   1 Inhaler   1   . diphenhydrAMINE (BENADRYL) 25 MG tablet   Oral   Take 25 mg by mouth every 8 (eight) hours as needed for itching.          . fluticasone (FLONASE) 50 MCG/ACT nasal spray   Nasal   Place 1 spray into the nose daily.   16 g   0   . folic acid (FOLVITE) 1 MG tablet   Oral   Take 1 mg by mouth every morning.         Marland Kitchen HYDROmorphone (DILAUDID) 4 MG tablet   Oral   Take 1 tablet (4 mg total) by mouth every 4 (four) hours as needed for pain.   20 tablet   0   . hydroxyurea (HYDREA) 500 MG capsule   Oral   Take 2,000 mg by mouth every morning. May take with food to minimize GI side effects.         Marland Kitchen morphine (MS CONTIN) 60 MG 12 hr tablet   Oral   Take 1 tablet (60 mg total) by mouth 2 (two) times daily.   12 tablet   0   . promethazine (PHENERGAN) 25 MG tablet   Oral  Take 1 tablet (25 mg total) by mouth every 6 (six) hours as needed for nausea.   30 tablet   0   . sennosides-docusate sodium (SENOKOT-S) 8.6-50 MG tablet   Oral   Take 1 tablet by mouth 2 (two) times daily.          BP 109/77  Pulse 119  Temp(Src) 99.8 F (37.7 C) (Oral)  Resp 16  Ht 5\' 9"  (1.753 m)  Wt 160 lb (72.576 kg)  BMI 23.62 kg/m2  SpO2 98% Physical Exam  Nursing note and vitals reviewed. Constitutional: He appears well-developed and well-nourished. No distress.  Laying in bed. No acute distress.  HENT:  Head: Normocephalic and atraumatic.  Eyes: Conjunctivae are normal. Right eye exhibits no discharge. Left eye exhibits no discharge.  Neck: Neck supple.  Cardiovascular: Regular rhythm and normal heart sounds.  Exam reveals no gallop and no friction rub.   No murmur heard. Tachycardic  Pulmonary/Chest: Effort normal and breath sounds normal. No respiratory distress.  Abdominal: Soft. He  exhibits no distension. There is no tenderness.  Musculoskeletal: He exhibits no edema and no tenderness.  No midline spinal tenderness. No bony tenderness of the extremities or apparent pain with range of motion of large joints.  Neurological: He is alert.  Skin: Skin is warm and dry. He is not diaphoretic.  Psychiatric: He has a normal mood and affect. His behavior is normal. Thought content normal.    ED Course  Procedures (including critical care time) Labs Review Labs Reviewed  CBC WITH DIFFERENTIAL - Abnormal; Notable for the following:    WBC 13.1 (*)    RBC 2.23 (*)    Hemoglobin 7.5 (*)    HCT 22.0 (*)    RDW 18.2 (*)    Platelets 593 (*)    Neutro Abs 8.7 (*)    Monocytes Absolute 1.2 (*)    All other components within normal limits  RETICULOCYTES - Abnormal; Notable for the following:    Retic Ct Pct 21.0 (*)    RBC. 2.23 (*)    Retic Count, Manual 468.3 (*)    All other components within normal limits  COMPREHENSIVE METABOLIC PANEL - Abnormal; Notable for the following:    Total Protein 8.7 (*)    Total Bilirubin 1.4 (*)    GFR calc non Af Amer 89 (*)    All other components within normal limits  URINALYSIS, ROUTINE W REFLEX MICROSCOPIC   Imaging Review Dg Chest 2 View  07/14/2013   CLINICAL DATA:  sickle cell disease with pain  EXAM: CHEST  2 VIEW  COMPARISON:  June 03, 2013  FINDINGS: The central catheter is been removed. There is no edema or consolidation. Heart is mildly enlarged with normal pulmonary vascularity. No adenopathy. No pneumothorax.  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 There are multiple endplate the infarcts in the thoracic and lumbar spine consistent with known sickle cell disease.  IMPRESSION: No edema or consolidation. Stable cardiac prominence. Bony changes present consistent with known sickle cell disease.   Electronically Signed   By: Bretta Bang M.D.   On: 07/14/2013 14:05    EKG Interpretation   None        MDM   1. Sickle cell pain crisis   2. SIRS (systemic inflammatory response syndrome)    26yM with back pain and fever. Hx of sickle cell. Source of fever not evident on initial work up. May be viral illness. Clinically appears well. Mild tachycardia  but may be pain or fever related. BP fine.  Also consider vertebral osteomyelitis with his history. He reports that back is typical of prior pain crises though. My suspicion for more sinister process is not high enough to pursuit advanced imaging or empiric abx at this time. Blood cultures drawn. H/H improved from prior labs. Given multiple rounds of pain medications with minimal relief. Will admit for further evaluation/tx.      Raeford Razor, MD 07/14/13 [phone]

## 2013-07-14 NOTE — ED Notes (Signed)
Pt. Stated he couldn't give a urine specimen at this time. Pt. Aware of giving a specimen. Nurse was notified.

## 2013-07-15 HISTORY — PX: PORTACATH PLACEMENT: SHX2246

## 2013-07-15 MED ORDER — POLYETHYLENE GLYCOL 3350 17 G PO PACK: 17.0000 g | PACK | Freq: Every day | ORAL | Status: DC

## 2013-07-15 MED ORDER — POLYETHYLENE GLYCOL 3350 17 G PO PACK
17.0000 g | PACK | Freq: Every day | ORAL | Status: DC | PRN
  Administered 2013-07-15 – 2013-07-21 (×5): 17 g via ORAL
  Filled 2013-07-15 (×3): qty 1

## 2013-07-15 NOTE — Progress Notes (Signed)
[facility] PROGRESS NOTE  Dennis Bernard RUE:[id number] DOB: [birth year] DOA: 07/14/2013 PCP: Dr. Craig Staggers, Teodoro Kil  Brief narrative: Dennis Bernard is an 27 y.o. male with a PMH of sickle cell anemia who was admitted 07/14/2013 with a vaso-occlusive crisis.  Assessment/Plan: Principal Problem:  Sickle cell pain crisis / backache  -Pain management: Continue long acting medications at usual home dose: MS Contin 60 mg Q 12 hours. Avoid increasing dose further to prevent further issues with tolerance.  -Continue custom dose PCA dilaudid while in acute crisis.0.8 mg Q 10 minutes, 10 mg/4 hour lock out, which is his usual PCA dosing schedule.  -Continue Toradol IV Q 6 hours for anti-inflammatory effect. Monitor renal function.  -Adjuvant pain therapy: None.  -Wean IV narcotics when pain rated 7/10. Pain still being rated at a level 8 today. -When able to tolerate oral therapy, convert at 50-75% of IV dose & give PRNs Q 2-3 hours.  -Monitor CBC (add differential if on Hydrea) Q 2-3 day(s).  -Monitor bilirubin/LDH Q 72 hours to monitor hemolysis. Current bilirubin: 1.4, LDH 533. -No evidence of aplastic anemia.  -Ferritin level last checked 03/23/13, and was 618.  -Continue Hydrea.  -Continue IVF: 1/2 NS at 50 cc/hr. Volume status: Slightly dry. D/C IVF when hydrated.  -K pad PRN.  -Continue folic acid.  Active problems:  Constipation induced by narcotics  -Senna-S for pro motility effect.  -Increase mobilization as tolerated.  Sickle cell anemia  -Patient's baseline hemoglobin is: 9-1-mg/dL  -Patient's transfusion hemoglobin threshold is: 5-6 mg/dL, history of delayed transfusion reaction.  -Current hemoglobin is: 7.5 mg/dL.  Fever  -Followup blood cultures. If these remain negative, can consider replacement of Port-A-Cath which was removed during a previous hospitalization secondary to infection.  Code Status: Full.  Family Communication: No family at bedside.   Disposition Plan: Home when stable.  Medical Consultants:  None.  Other Consultants:  None false  Anti-infectives:  None.  HPI/Subjective: Dennis Bernard tells me his back pain is "a little better ". Rates it currently at a level 8/10.  Denies constipation. Some nausea but no vomiting. Appetite fair.  Objective: Filed Vitals:   07/15/13 0031 07/15/13 0334 07/15/13 0442 07/15/13 0540  BP:    96/50  Pulse:    67  Temp:    98.4 F (36.9 C)  TempSrc:    Oral  Resp: 15 15 12 12   Height:      Weight:      SpO2: 100% 100% 100% 100%    Intake/Output Summary (Last 24 hours) at 07/15/13 0757 Last data filed at 07/15/13 0600  Gross per 24 hour  Intake 580.83 ml  Output      0 ml  Net 580.83 ml    Exam: Gen:  NAD Cardiovascular:  RRR, No M/R/G Respiratory:  Lungs CTAB Gastrointestinal:  Abdomen soft, NT/ND, + BS Extremities:  No C/E/C  Data Reviewed: Basic Metabolic Panel:  Recent Labs Lab 07/14/13 1338  NA 137  K 3.8  CL 106  CO2 19  GLUCOSE 97  BUN 17  CREATININE 1.11  CALCIUM 10.0   GFR Estimated Creatinine Clearance: 99.1 ml/min (by C-G formula based on Cr of 1.11). Liver Function Tests:  Recent Labs Lab 07/14/13 1338  AST 29  ALT 17  ALKPHOS 106  BILITOT 1.4*  PROT 8.7*  ALBUMIN 4.7   CBC:  Recent Labs Lab 07/14/13 1338  WBC 13.1*  NEUTROABS 8.7*  HGB 7.5*  HCT 22.0*  MCV 98.7  PLT 593*  Anemia work up  Recent Labs  07/14/13 1338  RETICCTPCT 21.0*   Microbiology No results found for this or any previous visit (from the past 240 hour(s)).   Procedures and Diagnostic Studies: Dg Chest 2 View  07/14/2013   CLINICAL DATA:  sickle cell disease with pain  EXAM: CHEST  2 VIEW  COMPARISON:  June 03, 2013  FINDINGS: The central catheter is been removed. There is no edema or consolidation. Heart is mildly enlarged with normal pulmonary vascularity. No adenopathy. No pneumothorax.  There is a total shoulder replacement on  the left. There are multiple endplate the infarcts in the thoracic and lumbar spine consistent with known sickle cell disease.  IMPRESSION: No edema or consolidation. Stable cardiac prominence. Bony changes present consistent with known sickle cell disease.   Electronically Signed   By: Bretta Bang M.D.   On: 07/14/2013 14:05    Scheduled Meds: . enoxaparin (LOVENOX) injection  40 mg Subcutaneous Q24H  . fluticasone  1 spray Each Nare Daily  . folic acid  1 mg Oral q morning - 10a  . HYDROmorphone PCA 0.3 mg/mL   Intravenous Q4H  . hydroxyurea  2,000 mg Oral q morning - 10a  . ketorolac  30 mg Intravenous Q6H  . morphine  60 mg Oral BID  . senna-docusate  1 tablet Oral BID   Continuous Infusions: . sodium chloride 50 mL/hr at 07/14/13 1823    Time spent: 25 minutes.   LOS: 1 day   Aaro Meyers  [facility] Pager [phone].   *Please note that the hospitalists switch teams on Wednesdays. Please call the flow manager at [phone] if you are having difficulty reaching the hospitalist taking care of this patient as she can update you and provide the most up-to-date pager number of provider caring for the patient. If 8PM-8AM, please contact night-coverage at [email or web address], password [facility]  07/15/2013, 7:57 AM

## 2013-07-16 MED ORDER — HYDROMORPHONE 0.3 MG/ML IV SOLN
INTRAVENOUS | Status: DC
  Administered 2013-07-16: 7.64 mg via INTRAVENOUS
  Administered 2013-07-16 (×4): via INTRAVENOUS
  Administered 2013-07-16: 7.76 mg via INTRAVENOUS
  Administered 2013-07-16: 7.19 mg via INTRAVENOUS
  Administered 2013-07-17: 8 mg via INTRAVENOUS
  Administered 2013-07-17: 3.4 mg via INTRAVENOUS
  Administered 2013-07-17: 05:00:00 via INTRAVENOUS
  Administered 2013-07-17: 7.59 mg via INTRAVENOUS
  Administered 2013-07-17: 21:00:00 via INTRAVENOUS
  Administered 2013-07-17: 7 mg via INTRAVENOUS
  Administered 2013-07-17: 18:00:00 via INTRAVENOUS
  Administered 2013-07-17: 7.47 mg via INTRAVENOUS
  Administered 2013-07-18: 04:00:00 via INTRAVENOUS
  Administered 2013-07-18: 6.99 mg via INTRAVENOUS
  Administered 2013-07-18: 16:00:00 via INTRAVENOUS
  Administered 2013-07-18: 2.64 mg via INTRAVENOUS
  Administered 2013-07-18: 8.1 mg via INTRAVENOUS
  Administered 2013-07-18: 20:00:00 via INTRAVENOUS
  Administered 2013-07-18: 5.59 mg via INTRAVENOUS
  Administered 2013-07-18: 12:00:00 via INTRAVENOUS
  Administered 2013-07-18: 6.99 mg via INTRAVENOUS
  Administered 2013-07-18 – 2013-07-19 (×2): via INTRAVENOUS
  Administered 2013-07-19: 4.19 mg via INTRAVENOUS
  Administered 2013-07-19: 4.7 mg via INTRAVENOUS
  Administered 2013-07-19: 07:00:00 via INTRAVENOUS
  Administered 2013-07-19: 12.54 mg via INTRAVENOUS
  Filled 2013-07-16 (×18): qty 25

## 2013-07-16 NOTE — Progress Notes (Signed)
[facility] PROGRESS NOTE  Dennis Bernard WUJ:[id number] DOB: November 26, 1985 DOA: 07/14/2013 PCP: Dr. Craig Staggers, Teodoro Kil  Brief narrative: Dennis Bernard is an 27 y.o. male with a PMH of sickle cell anemia who was admitted 07/14/2013 with a vaso-occlusive crisis.  Assessment/Plan: Principal Problem:  Sickle cell pain crisis / backache  -Pain management: Continue long acting medications at usual home dose: MS Contin 60 mg Q 12 hours. Avoid increasing dose further to prevent further issues with tolerance.  -Continue custom dose PCA dilaudid while in acute crisis. Current PCA settings: 0.8 mg Q 10 minutes, 10 mg/4 hour lock out, which is his usual PCA dosing schedule. Will decrease bolus to 0.7 mg and 4 hour limit to 8 mg. -Continue Toradol IV Q 6 hours for anti-inflammatory effect. Monitor renal function.  -Adjuvant pain therapy: None.  -Wean IV narcotics when pain rated 7/10. Pain rated 7/10 today. -When able to tolerate oral therapy, convert at 50-75% of IV dose & give PRNs Q 2-3 hours.  -Monitor CBC (add differential if on Hydrea) Q 2-3 day(s).  -Monitor bilirubin/LDH Q 72 hours to monitor hemolysis. Current bilirubin: 1.4, LDH 533. -No evidence of aplastic anemia.  -Ferritin level last checked 03/23/13, and was 618.  -Continue Hydrea.  -Continue IVF: 1/2 NS at 50 cc/hr. Volume status: Slightly dry with poor intake. D/C IVF when hydrated and PO intake improved.  -K pad PRN.  -Continue folic acid.  Active problems:  Constipation induced by narcotics  -Senna-S for pro motility effect.  -Increase mobilization as tolerated.  Sickle cell anemia  -Patient's baseline hemoglobin is: 9-1-mg/dL  -Patient's transfusion hemoglobin threshold is: 5-6 mg/dL, history of delayed transfusion reaction.  -Current hemoglobin is: 7.5 mg/dL.  Fever  -Followup blood cultures, which are negative to date. If these remain negative, can consider replacement of Port-A-Cath which was removed during a  previous hospitalization secondary to infection.  Code Status: Full.  Family Communication: No family at bedside.  Disposition Plan: Home when stable.  Medical Consultants:  None.  Other Consultants:  None.  Anti-infectives:  None.  HPI/Subjective: Dennis Bernard tells me his back pain is "a little better ". Rates it currently at a level 7/10, and his back.  Denies constipation. Does need his bowels moved this morning. Some nausea but no vomiting. Appetite fair.  Objective: Filed Vitals:   07/16/13 0005 07/16/13 0155 07/16/13 0500 07/16/13 0502  BP:  91/53 97/53   Pulse:  87 83   Temp:  98.3 F (36.8 C) 98 F (36.7 C)   TempSrc:  Oral Oral   Resp: 18 18 18 18   Height:      Weight:   76.794 kg (169 lb 4.8 oz)   SpO2: 100% 100% 100% 100%    Intake/Output Summary (Last 24 hours) at 07/16/13 0800 Last data filed at 07/16/13 0542  Gross per 24 hour  Intake 1162.73 ml  Output   2975 ml  Net -1812.27 ml    Exam: Gen:  NAD Cardiovascular:  RRR, No M/R/G Respiratory:  Lungs CTAB Gastrointestinal:  Abdomen soft, NT/ND, + BS Extremities:  No C/E/C  Data Reviewed: Basic Metabolic Panel:  Recent Labs Lab 07/14/13 1338  NA 137  K 3.8  CL 106  CO2 19  GLUCOSE 97  BUN 17  CREATININE 1.11  CALCIUM 10.0   GFR Estimated Creatinine Clearance: 99.1 ml/min (by C-G formula based on Cr of 1.11). Liver Function Tests:  Recent Labs Lab 07/14/13 1338  AST 29  ALT 17  ALKPHOS  106  BILITOT 1.4*  PROT 8.7*  ALBUMIN 4.7   CBC:  Recent Labs Lab 07/14/13 1338  WBC 13.1*  NEUTROABS 8.7*  HGB 7.5*  HCT 22.0*  MCV 98.7  PLT 593*   Anemia work up  Recent Labs  07/14/13 1338  RETICCTPCT 21.0*   Microbiology Recent Results (from the past 240 hour(s))  CULTURE, BLOOD (ROUTINE X 2)     Status: None   Collection Time    07/14/13  4:30 PM      Result Value Range Status   Specimen Description BLOOD RIGHT ARM   Final   Special Requests BOTTLES DRAWN  AEROBIC AND ANAEROBIC 5CC   Final   Culture  Setup Time     Final   Value: 07/14/2013 22:32     Performed at [facility]   Culture     Final   Value:        BLOOD CULTURE RECEIVED NO GROWTH TO DATE CULTURE WILL BE HELD FOR 5 DAYS BEFORE ISSUING A FINAL NEGATIVE REPORT     Performed at [facility]   Report Status PENDING   Incomplete  CULTURE, BLOOD (ROUTINE X 2)     Status: None   Collection Time    07/14/13  4:35 PM      Result Value Range Status   Specimen Description BLOOD LEFT ARM   Final   Special Requests BOTTLES DRAWN AEROBIC AND ANAEROBIC 5CC   Final   Culture  Setup Time     Final   Value: 07/14/2013 22:31     Performed at [facility]   Culture     Final   Value:        BLOOD CULTURE RECEIVED NO GROWTH TO DATE CULTURE WILL BE HELD FOR 5 DAYS BEFORE ISSUING A FINAL NEGATIVE REPORT     Performed at [facility]   Report Status PENDING   Incomplete     Procedures and Diagnostic Studies: Dg Chest 2 View  07/14/2013   CLINICAL DATA:  sickle cell disease with pain  EXAM: CHEST  2 VIEW  COMPARISON:  June 03, 2013  FINDINGS: The central catheter is been removed. There is no edema or consolidation. Heart is mildly enlarged with normal pulmonary vascularity. No adenopathy. No pneumothorax.  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 There are multiple endplate the infarcts in the thoracic and lumbar spine consistent with known sickle cell disease.  IMPRESSION: No edema or consolidation. Stable cardiac prominence. Bony changes present consistent with known sickle cell disease.   Electronically Signed   By: Bretta Bang M.D.   On: 07/14/2013 14:05    Scheduled Meds: . enoxaparin (LOVENOX) injection  40 mg Subcutaneous Q24H  . fluticasone  1 spray Each Nare Daily  . folic acid  1 mg Oral q morning - 10a  . HYDROmorphone PCA 0.3 mg/mL   Intravenous Q4H  . hydroxyurea  2,000 mg Oral q morning - 10a  . ketorolac  30 mg Intravenous Q6H   . morphine  60 mg Oral BID  . senna-docusate  1 tablet Oral BID   Continuous Infusions: . sodium chloride 50 mL/hr at 07/16/13 0110    Time spent: 25 minutes.   LOS: 2 days   RAMA,CHRISTINA  [facility] Pager [phone].   *Please note that the hospitalists switch teams on Wednesdays. Please call the flow manager at [phone] if you are having difficulty reaching the hospitalist taking care of this patient  as she can update you and provide the most up-to-date pager number of provider caring for the patient. If 8PM-8AM, please contact night-coverage at [email or web address], password [facility]  07/16/2013, 8:00 AM

## 2013-07-16 NOTE — Progress Notes (Signed)
Nutrition Brief Note  Patient identified on the Malnutrition Screening Tool (MST) Report  Wt Readings from Last 15 Encounters:  07/16/13 169 lb 4.8 oz (76.794 kg)  05/28/13 168 lb 14 oz (76.6 kg)  05/14/13 170 lb (77.111 kg)  03/27/13 175 lb 12.8 oz (79.742 kg)  02/20/13 174 lb 9.6 oz (79.198 kg)  02/14/13 172 lb (78.019 kg)  02/13/13 179 lb (81.194 kg)  01/19/13 170 lb (77.111 kg)  01/14/13 179 lb 6.4 oz (81.375 kg)  01/08/13 177 lb (80.287 kg)  12/24/12 173 lb 1 oz (78.5 kg)  12/12/12 173 lb 1.6 oz (78.518 kg)  11/26/12 182 lb 15.7 oz (83 kg)  11/10/12 178 lb 2.1 oz (80.8 kg)  10/22/12 168 lb 9.6 oz (76.476 kg)    Body mass index is 24.99 kg/(m^2). Patient meets criteria for wnl based on current BMI.   Current diet order is regulr, patient is consuming approximately 100% of meals at this time. Labs and medications reviewed.   Patient reports UBW of 165 with no recent weight change per patient and small amounts per chart.  Intake very good.  No nutrition interventions warranted at this time. If nutrition issues arise, please consult RD.   Oran Rein, RD, LDN Clinical Inpatient Dietitian Pager:  [phone] Weekend and after hours pager:  [phone]

## 2013-07-17 DIAGNOSIS — D5700 Hb-SS disease with crisis, unspecified: Secondary | ICD-10-CM | POA: Diagnosis not present

## 2013-07-17 DIAGNOSIS — M549 Dorsalgia, unspecified: Secondary | ICD-10-CM | POA: Diagnosis not present

## 2013-07-17 DIAGNOSIS — R509 Fever, unspecified: Secondary | ICD-10-CM

## 2013-07-17 LAB — COMPREHENSIVE METABOLIC PANEL
ALT: 9 U/L (ref 0–53)
Albumin: 4.2 g/dL (ref 3.5–5.2)
Alkaline Phosphatase: 86 U/L (ref 39–117)
CO2: 20 mEq/L (ref 19–32)
GFR calc non Af Amer: >90 mL/min (ref >90)
Potassium: 4.1 mEq/L (ref 3.5–5.1)
Sodium: 135 mEq/L (ref 135–145)
Total Bilirubin: 1 mg/dL (ref 0.3–1.2)
Total Protein: 7.8 g/dL (ref 6.0–8.3)

## 2013-07-17 LAB — CBC WITH DIFFERENTIAL/PLATELET
Basophils Absolute: 0 10*3/uL (ref 0.0–0.1)
Eosinophils Absolute: 0.1 10*3/uL (ref 0.0–0.7)
Eosinophils Relative: 1 % (ref 0–5)
HCT: 22.6 % — ABNORMAL LOW (ref 39.0–52.0)
Lymphocytes Relative: 28 % (ref 12–46)
MCHC: 34.1 g/dL (ref 30.0–36.0)
MCV: 94.2 fL (ref 78.0–100.0)
Monocytes Absolute: 0.7 10*3/uL (ref 0.1–1.0)
RDW: 16.2 % — ABNORMAL HIGH (ref 11.5–15.5)
WBC: 12 10*3/uL — ABNORMAL HIGH (ref 4.0–10.5)

## 2013-07-17 MED ORDER — DIPHENHYDRAMINE HCL 12.5 MG/5ML PO ELIX: 12.5000 mg | ORAL_SOLUTION | Freq: Four times a day (QID) | ORAL | Status: DC | PRN

## 2013-07-17 MED ORDER — SODIUM CHLORIDE 0.9 % IJ SOLN: 10.0000 mL | INTRAMUSCULAR | Status: DC | PRN

## 2013-07-17 MED ORDER — DIPHENHYDRAMINE HCL 50 MG/ML IJ SOLN
25.0000 mg | Freq: Four times a day (QID) | INTRAMUSCULAR | Status: DC | PRN
  Administered 2013-07-17: 25 mg via INTRAVENOUS
  Administered 2013-07-17: 12.5 mg via INTRAVENOUS
  Administered 2013-07-18 – 2013-07-20 (×11): 25 mg via INTRAVENOUS
  Filled 2013-07-17 (×14): qty 1

## 2013-07-17 MED ORDER — SODIUM CHLORIDE 0.9 % IJ SOLN
10.0000 mL | Freq: Two times a day (BID) | INTRAMUSCULAR | Status: DC
  Administered 2013-07-20 – 2013-07-26 (×6): 10 mL

## 2013-07-17 NOTE — Progress Notes (Signed)
[facility] PROGRESS NOTE  Dennis Bernard ZOX:[id number] DOB: 1986-08-27 DOA: 07/14/2013 PCP: Dr. Craig Staggers, Teodoro Kil  Brief narrative: Dennis Bernard is an 27 y.o. male with a PMH of sickle cell anemia who was admitted 07/14/2013 with a vaso-occlusive crisis.  Assessment/Plan: Principal Problem:  Sickle cell pain crisis / backache  -Pain management: Continue long acting medications at usual home dose: MS Contin 60 mg Q 12 hours. Avoid increasing dose further to prevent further issues with tolerance.  -Continue custom dose PCA dilaudid while in acute crisis. Current PCA settings: 0.8 mg Q 10 minutes, 10 mg/4 hour lock out, which is his usual PCA dosing schedule. Will decrease bolus to 0.7 mg and 4 hour limit to 8 mg. -Continue Toradol IV Q 6 hours for anti-inflammatory effect. Monitor renal function.  -Adjuvant pain therapy: None.  -Wean IV narcotics when pain rated 7/10. Pain rated 7/10 today. -When able to tolerate oral therapy, convert at 50-75% of IV dose & give PRNs Q 2-3 hours.  -Monitor CBC (add differential if on Hydrea) Q 2-3 day(s).  -Monitor bilirubin/LDH Q 72 hours to monitor hemolysis. Current bilirubin: 1.0 LDH 533. -No evidence of aplastic anemia.  -Ferritin level last checked 03/23/13, and was 618.  -Continue Hydrea.  -Continue IVF: 1/2 NS at 50 cc/hr. Volume status: Slightly dry with poor intake. D/C IVF when hydrated and PO intake improved.  -K pad PRN.  -Continue folic acid.  Active problems:  Constipation induced by narcotics  -Senna-S for pro motility effect.  -Increase mobilization as tolerated.  Bowels moving Sickle cell anemia  -Patient's baseline hemoglobin is: 9-1-mg/dL  -Patient's transfusion hemoglobin threshold is: 5-6 mg/dL, history of delayed transfusion reaction.  -Current hemoglobin is: 7.7 mg/dL, mild increase from yesterday Fever  -Followup blood cultures, which are negative to date. If these remain negative, can consider replacement  of Port-A-Cath which was removed during a previous hospitalization secondary to infection.  Code Status: Full.  Family Communication: No family at bedside. Message left a phone Disposition Plan: Home when stable.  Medical Consultants:  None.  Other Consultants:  None.  Anti-infectives:  None.  HPI/Subjective: Patient feeling about the same, slightly better from previous day. Pain is still around a 7/10  Objective: Filed Vitals:   07/16/13 2319 07/17/13 0205 07/17/13 0450 07/17/13 0503  BP:  91/54  113/72  Pulse:  79  83  Temp:  98.1 F (36.7 C)  98.6 F (37 C)  TempSrc:  Oral  Oral  Resp: 18 16 18 18   Height:      Weight:      SpO2: 100% 100% 100% 100%    Intake/Output Summary (Last 24 hours) at 07/17/13 0847 Last data filed at 07/17/13 0501  Gross per 24 hour  Intake 2205.67 ml  Output   2825 ml  Net -619.33 ml    Exam: Gen:  NAD Cardiovascular:  RRR, No M/R/G Respiratory:  Lungs CTAB Gastrointestinal:  Abdomen soft, NT/ND, + BS Extremities:  No C/E/C  Data Reviewed: Basic Metabolic Panel:  Recent Labs Lab 07/14/13 1338 07/17/13 0535  NA 137 135  K 3.8 4.1  CL 106 103  CO2 19 20  GLUCOSE 97 78  BUN 17 11  CREATININE 1.11 0.73  CALCIUM 10.0 9.2   GFR Estimated Creatinine Clearance: 137.5 ml/min (by C-G formula based on Cr of 0.73). Liver Function Tests:  Recent Labs Lab 07/14/13 1338 07/17/13 0535  AST 29 24  ALT 17 9  ALKPHOS 106 86  BILITOT 1.4* 1.0  PROT 8.7* 7.8  ALBUMIN 4.7 4.2   CBC:  Recent Labs Lab 07/14/13 1338 07/17/13 0535  WBC 13.1* 12.0*  NEUTROABS 8.7* 7.8*  HGB 7.5* 7.7*  HCT 22.0* 22.6*  MCV 98.7 94.2  PLT 593* 526*   Anemia work up  Recent Labs  07/14/13 1338  RETICCTPCT 21.0*   Microbiology Recent Results (from the past 240 hour(s))  CULTURE, BLOOD (ROUTINE X 2)     Status: None   Collection Time    07/14/13  4:30 PM      Result Value Range Status   Specimen Description BLOOD RIGHT ARM    Final   Special Requests BOTTLES DRAWN AEROBIC AND ANAEROBIC 5CC   Final   Culture  Setup Time     Final   Value: 07/14/2013 22:32     Performed at [facility]   Culture     Final   Value:        BLOOD CULTURE RECEIVED NO GROWTH TO DATE CULTURE WILL BE HELD FOR 5 DAYS BEFORE ISSUING A FINAL NEGATIVE REPORT     Performed at [facility]   Report Status PENDING   Incomplete  CULTURE, BLOOD (ROUTINE X 2)     Status: None   Collection Time    07/14/13  4:35 PM      Result Value Range Status   Specimen Description BLOOD LEFT ARM   Final   Special Requests BOTTLES DRAWN AEROBIC AND ANAEROBIC 5CC   Final   Culture  Setup Time     Final   Value: 07/14/2013 22:31     Performed at [facility]   Culture     Final   Value:        BLOOD CULTURE RECEIVED NO GROWTH TO DATE CULTURE WILL BE HELD FOR 5 DAYS BEFORE ISSUING A FINAL NEGATIVE REPORT     Performed at [facility]   Report Status PENDING   Incomplete     Procedures and Diagnostic Studies: Dg Chest 2 View  07/14/2013   CLINICAL DATA:  sickle cell disease with pain  EXAM: CHEST  2 VIEW  COMPARISON:  June 03, 2013  FINDINGS: The central catheter is been removed. There is no edema or consolidation. Heart is mildly enlarged with normal pulmonary vascularity. No adenopathy. No pneumothorax.  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 There are multiple endplate the infarcts in the thoracic and lumbar spine consistent with known sickle cell disease.  IMPRESSION: No edema or consolidation. Stable cardiac prominence. Bony changes present consistent with known sickle cell disease.   Electronically Signed   By: Bretta Bang M.D.   On: 07/14/2013 14:05    Scheduled Meds: . enoxaparin (LOVENOX) injection  40 mg Subcutaneous Q24H  . fluticasone  1 spray Each Nare Daily  . folic acid  1 mg Oral q morning - 10a  . HYDROmorphone PCA 0.3 mg/mL   Intravenous Q4H  . hydroxyurea  2,000 mg Oral q morning -  10a  . ketorolac  30 mg Intravenous Q6H  . morphine  60 mg Oral BID  . senna-docusate  1 tablet Oral BID   Continuous Infusions: . sodium chloride 50 mL/hr at 07/16/13 1514    Time spent: 20 minutes.   LOS: 3 days   Hollice Espy  [facility] Pager [phone]   *Please note that the hospitalists switch teams on Wednesdays. Please call the flow manager at [phone] if you are having difficulty reaching the  hospitalist taking care of this patient as she can update you and provide the most up-to-date pager number of provider caring for the patient. If 8PM-8AM, please contact night-coverage at [email or web address], password [facility]  07/17/2013, 8:47 AM

## 2013-07-17 NOTE — Progress Notes (Signed)
Peripherally Inserted Central Catheter/Midline Placement  The IV Nurse has discussed with the patient and/or persons authorized to consent for the patient, the purpose of this procedure and the potential benefits and risks involved with this procedure.  The benefits include less needle sticks, lab draws from the catheter and patient may be discharged home with the catheter.  Risks include, but not limited to, infection, bleeding, blood clot (thrombus formation), and puncture of an artery; nerve damage and irregular heat beat.  Alternatives to this procedure were also discussed.  PICC/Midline Placement Documentation  PICC / Midline Single Lumen PICC Right (Active)     PICC / Midline Single Lumen 07/17/13 Right Brachial 11 cm 0 cm (Active)  Indication for Insertion or Continuance of Line Poor Vasculature-patient has had multiple peripheral attempts or PIVs lasting less than 24 hours 07/17/2013  5:25 PM  Exposed Catheter (cm) 0 cm 07/17/2013  5:25 PM  Dressing Change Due 07/24/13 07/17/2013  5:25 PM       Stacie Glaze Horton 07/17/2013, 5:54 PM

## 2013-07-18 NOTE — Progress Notes (Signed)
[facility] PROGRESS NOTE  Dennis Bernard WGN:[id number] DOB: 1985-12-27 DOA: 07/14/2013 PCP: Dr. Craig Bernard, Dennis Bernard  Brief narrative: Dennis Bernard is an 27 y.o. male with a PMH of sickle cell anemia who was admitted 07/14/2013 with a vaso-occlusive crisis.  Assessment/Plan: Principal Problem:  Sickle cell pain crisis / backache  -Pain management: Continue long acting medications at usual home dose: MS Contin 60 mg Q 12 hours. Avoid increasing dose further to prevent further issues with tolerance.  -Continue custom dose PCA dilaudid while in acute crisis. Current PCA settings: 0.8 mg Q 10 minutes, 10 mg/4 hour lock out, which is his usual PCA dosing schedule. Will decrease bolus to 0.7 mg and 4 hour limit to 8 mg. -Continue Toradol IV Q 6 hours for anti-inflammatory effect. Monitor renal function.  -Adjuvant pain therapy: None.  -Wean IV narcotics when pain rated 7/10. Pain rated 7/10 today. -When able to tolerate oral therapy, convert at 50-75% of IV dose & give PRNs Q 2-3 hours.  -Monitor CBC (add differential if on Hydrea) Q 2-3 day(s). Hemoglobin remaining stable -Monitor bilirubin/LDH Q 72 hours to monitor hemolysis. Current bilirubin: 1.0 LDH 533.-Repeat LDH tomorrow -No evidence of aplastic anemia.  -Ferritin level last checked 03/23/13, and was 618.  -Continue Hydrea.  -Continue IVF: 1/2 NS at 50 cc/hr. Volume status: Slightly dry with poor intake. D/C IVF when hydrated and PO intake improved.  -K pad PRN.  -Continue folic acid.  Active problems:  Constipation induced by narcotics  -Senna-S for pro motility effect.  -Increase mobilization as tolerated.  Bowels moving Sickle cell anemia  -Patient's baseline hemoglobin is: 9-1-mg/dL  -Patient's transfusion hemoglobin threshold is: 5-6 mg/dL, history of delayed transfusion reaction.  -Current hemoglobin is: 7.7 mg/dL, repeat CBC tomorrow Fever  -Followup blood cultures, which are negative to date. If these  remain negative, can consider replacement of Port-A-Cath which was removed during a previous hospitalization secondary to infection.  Code Status: Full.  Family Communication: No family at bedside. Message left a phone Disposition Plan: Home when stable.  Medical Consultants:  None.  Other Consultants:  None.  Anti-infectives:  None.  HPI/Subjective: Patient feeling a little bit better than yesterday, overall still some pain in lower back  Objective: Filed Vitals:   07/18/13 0135 07/18/13 0329 07/18/13 0602 07/18/13 0742  BP: 94/51  94/63   Pulse: 93  87   Temp: 98.4 F (36.9 C)  98.8 F (37.1 C)   TempSrc: Oral  Oral   Resp: 12 12 14 12   Height:      Weight:      SpO2: 100% 100% 100% 100%    Intake/Output Summary (Last 24 hours) at 07/18/13 0828 Last data filed at 07/18/13 0600  Gross per 24 hour  Intake 1609.17 ml  Output   1201 ml  Net 408.17 ml    Exam: Gen:  NAD Cardiovascular:  RRR, No M/R/G Respiratory:  Lungs CTAB Gastrointestinal:  Abdomen soft, NT/ND, + BS Extremities:  No C/E/C  Data Reviewed: Basic Metabolic Panel:  Recent Labs Lab 07/14/13 1338 07/17/13 0535  NA 137 135  K 3.8 4.1  CL 106 103  CO2 19 20  GLUCOSE 97 78  BUN 17 11  CREATININE 1.11 0.73  CALCIUM 10.0 9.2   GFR Estimated Creatinine Clearance: 137.5 ml/min (by C-G formula based on Cr of 0.73). Liver Function Tests:  Recent Labs Lab 07/14/13 1338 07/17/13 0535  AST 29 24  ALT 17 9  ALKPHOS 106 86  BILITOT  1.4* 1.0  PROT 8.7* 7.8  ALBUMIN 4.7 4.2   CBC:  Recent Labs Lab 07/14/13 1338 07/17/13 0535  WBC 13.1* 12.0*  NEUTROABS 8.7* 7.8*  HGB 7.5* 7.7*  HCT 22.0* 22.6*  MCV 98.7 94.2  PLT 593* 526*   Anemia work up No results found for this basename: VITAMINB12, FOLATE, FERRITIN, TIBC, IRON, RETICCTPCT,  in the last 72 hours Microbiology Recent Results (from the past 240 hour(s))  CULTURE, BLOOD (ROUTINE X 2)     Status: None   Collection Time     07/14/13  4:30 PM      Result Value Range Status   Specimen Description BLOOD RIGHT ARM   Final   Special Requests BOTTLES DRAWN AEROBIC AND ANAEROBIC 5CC   Final   Culture  Setup Time     Final   Value: 07/14/2013 22:32     Performed at [facility]   Culture     Final   Value:        BLOOD CULTURE RECEIVED NO GROWTH TO DATE CULTURE WILL BE HELD FOR 5 DAYS BEFORE ISSUING A FINAL NEGATIVE REPORT     Performed at [facility]   Report Status PENDING   Incomplete  CULTURE, BLOOD (ROUTINE X 2)     Status: None   Collection Time    07/14/13  4:35 PM      Result Value Range Status   Specimen Description BLOOD LEFT ARM   Final   Special Requests BOTTLES DRAWN AEROBIC AND ANAEROBIC 5CC   Final   Culture  Setup Time     Final   Value: 07/14/2013 22:31     Performed at [facility]   Culture     Final   Value:        BLOOD CULTURE RECEIVED NO GROWTH TO DATE CULTURE WILL BE HELD FOR 5 DAYS BEFORE ISSUING A FINAL NEGATIVE REPORT     Performed at [facility]   Report Status PENDING   Incomplete     Procedures and Diagnostic Studies: Dg Chest 2 View  07/14/2013   CLINICAL DATA:  sickle cell disease with pain  EXAM: CHEST  2 VIEW  COMPARISON:  June 03, 2013  FINDINGS: The central catheter is been removed. There is no edema or consolidation. Heart is mildly enlarged with normal pulmonary vascularity. No adenopathy. No pneumothorax.  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 There are multiple endplate the infarcts in the thoracic and lumbar spine consistent with known sickle cell disease.  IMPRESSION: No edema or consolidation. Stable cardiac prominence. Bony changes present consistent with known sickle cell disease.   Electronically Signed   By: Bretta Bang M.D.   On: 07/14/2013 14:05    Scheduled Meds: . enoxaparin (LOVENOX) injection  40 mg Subcutaneous Q24H  . fluticasone  1 spray Each Nare Daily  . folic acid  1 mg Oral q morning - 10a   . HYDROmorphone PCA 0.3 mg/mL   Intravenous Q4H  . hydroxyurea  2,000 mg Oral q morning - 10a  . ketorolac  30 mg Intravenous Q6H  . morphine  60 mg Oral BID  . senna-docusate  1 tablet Oral BID  . sodium chloride  10-40 mL Intracatheter Q12H   Continuous Infusions: . sodium chloride 50 mL/hr at 07/18/13 0330    Time spent: 15 minutes.   LOS: 4 days   Hollice Espy  [facility] Pager [phone]   *Please note that the  hospitalists switch teams on Wednesdays. Please call the flow manager at [phone] if you are having difficulty reaching the hospitalist taking care of this patient as she can update you and provide the most up-to-date pager number of provider caring for the patient. If 8PM-8AM, please contact night-coverage at [email or web address], password [facility]  07/18/2013, 8:28 AM

## 2013-07-19 DIAGNOSIS — D5700 Hb-SS disease with crisis, unspecified: Secondary | ICD-10-CM | POA: Diagnosis not present

## 2013-07-19 DIAGNOSIS — M549 Dorsalgia, unspecified: Secondary | ICD-10-CM | POA: Diagnosis not present

## 2013-07-19 LAB — LACTATE DEHYDROGENASE: LDH: 313 U/L — ABNORMAL HIGH (ref 94–250)

## 2013-07-19 LAB — CBC
HCT: 14.7 % — ABNORMAL LOW (ref 39.0–52.0)
Hemoglobin: 5.3 g/dL — CL (ref 13.0–17.0)
MCHC: 36.1 g/dL — ABNORMAL HIGH (ref 30.0–36.0)
MCV: 92.5 fL (ref 78.0–100.0)
RBC: 1.59 MIL/uL — ABNORMAL LOW (ref 4.22–5.81)
WBC: 8.7 10*3/uL (ref 4.0–10.5)

## 2013-07-19 MED ORDER — HYDROMORPHONE 0.3 MG/ML IV SOLN
INTRAVENOUS | Status: DC
  Administered 2013-07-19: 15:00:00 via INTRAVENOUS
  Administered 2013-07-19: 7.64 mg via INTRAVENOUS
  Administered 2013-07-19 (×2): via INTRAVENOUS
  Administered 2013-07-19: 11.26 mg via INTRAVENOUS
  Administered 2013-07-19: 19:00:00 via INTRAVENOUS
  Administered 2013-07-20: 7 mg via INTRAVENOUS
  Administered 2013-07-20: 19:00:00 via INTRAVENOUS
  Administered 2013-07-20: 8.29 mg via INTRAVENOUS
  Administered 2013-07-20: 7.9 mg via INTRAVENOUS
  Administered 2013-07-20: 9.76 mg via INTRAVENOUS
  Administered 2013-07-20: 0.474 mg via INTRAVENOUS
  Administered 2013-07-20 (×3): via INTRAVENOUS
  Filled 2013-07-19 (×8): qty 25

## 2013-07-19 NOTE — Progress Notes (Signed)
CRITICAL VALUE ALERT  Critical value received:  HGB 5.3  Date of notification:  07/19/13  Time of notification:  0522  Critical value read back:Yes  Nurse who received alert:  Jola Baptist, RN  MD notified (1st page):  Craige Cotta Time of first page:  0530  MD notified (2nd page): n/a  Time of second page: n/a  Responding MD:  Craige Cotta  Time MD responded:  0530 (orders received)

## 2013-07-19 NOTE — Progress Notes (Signed)
[facility] PROGRESS NOTE  Dennis Bernard ZOX:[id number] DOB: Nov 18, 1985 DOA: 07/14/2013 PCP: Dr. Craig Staggers, Teodoro Kil  Brief narrative: Dennis Bernard is an 27 y.o. male with a PMH of sickle cell anemia who was admitted 07/14/2013 with a vaso-occlusive crisis.  Assessment/Plan: Principal Problem:  Sickle cell pain crisis / backache  -Pain management: Continue long acting medications at usual home dose: MS Contin 60 mg Q 12 hours. Avoid increasing dose further to prevent further issues with tolerance.  -Continue custom dose PCA dilaudid while in acute crisis. Current PCA settings: 0.7 mg Q 10 minutes, 8 mg/4 hour lock out, which is his usual PCA dosing schedule. Will increase PCA setting 1 mg every 10 minutes, 10 mg/4 hour lockout. -Continue Toradol IV Q 6 hours for anti-inflammatory effect. Monitor renal function.  -Adjuvant pain therapy: None.  -Wean IV narcotics when pain rated 7/10. Pain rated 8/10 today. -When able to tolerate oral therapy, convert at 50-75% of IV dose & give PRNs Q 2-3 hours.  -Monitor CBC (add differential if on Hydrea) Q 2-3 day(s).  -Monitor bilirubin/LDH Q 72 hours to monitor hemolysis. Current bilirubin: 1.4, LDH 533. Recheck in the morning. -No evidence of aplastic anemia. Recheck reticulocyte count given drop in hemoglobin. -Ferritin level last checked 03/23/13, and was 618.  -Continue Hydrea.  -Continue IVF: 1/2 NS at 50 cc/hr. Volume status: Slightly dry with poor intake. D/C IVF when hydrated and PO intake improved.  -K pad PRN.  -Continue folic acid.  Active problems:  Constipation induced by narcotics  -Senna-S for pro motility effect.  -Increase mobilization as tolerated.  Sickle cell anemia  -Patient's baseline hemoglobin is: 9 mg/dL  -Patient's transfusion hemoglobin threshold is: 5-6 mg/dL, history of delayed transfusion reaction.  -Current hemoglobin is: 5.3 mg/dL.  Fever  -Followup blood cultures, which are negative to date. If  these remain negative, can consider replacement of Port-A-Cath which was removed during a previous hospitalization secondary to infection.  Code Status: Full.  Family Communication: No family at bedside.  Disposition Plan: Home when stable.  Medical Consultants:  None.  Other Consultants:  None.  Anti-infectives:  None.  HPI/Subjective: Dennis Bernard tells me his back pain is currently rated 8/10 and that his pain has not improved significantly over the past 48 hours. No nausea or vomiting. Appetite fair. He states his bowels are moving.  Objective: Filed Vitals:   07/19/13 0639 07/19/13 0800 07/19/13 1002 07/19/13 1215  BP:   110/65   Pulse:   83   Temp:   98.2 F (36.8 C)   TempSrc:   Oral   Resp: 16 20 16 16   Height:      Weight:      SpO2:  98% 98% 99%    Intake/Output Summary (Last 24 hours) at 07/19/13 1220 Last data filed at 07/19/13 1000  Gross per 24 hour  Intake   3210 ml  Output   2525 ml  Net    685 ml    Exam: Gen:  NAD Cardiovascular:  RRR, No M/R/G Respiratory:  Lungs CTAB Gastrointestinal:  Abdomen soft, NT/ND, + BS Extremities:  No C/E/C  Data Reviewed: Basic Metabolic Panel:  Recent Labs Lab 07/14/13 1338 07/17/13 0535  NA 137 135  K 3.8 4.1  CL 106 103  CO2 19 20  GLUCOSE 97 78  BUN 17 11  CREATININE 1.11 0.73  CALCIUM 10.0 9.2   GFR Estimated Creatinine Clearance: 137.5 ml/min (by C-G formula based on Cr of 0.73). Liver Function  Tests:  Recent Labs Lab 07/14/13 1338 07/17/13 0535  AST 29 24  ALT 17 9  ALKPHOS 106 86  BILITOT 1.4* 1.0  PROT 8.7* 7.8  ALBUMIN 4.7 4.2   CBC:  Recent Labs Lab 07/14/13 1338 07/17/13 0535 07/19/13 0500  WBC 13.1* 12.0* 8.7  NEUTROABS 8.7* 7.8*  --   HGB 7.5* 7.7* 5.3*  HCT 22.0* 22.6* 14.7*  MCV 98.7 94.2 92.5  PLT 593* 526* 360   Anemia work up No results found for this basename: VITAMINB12, FOLATE, FERRITIN, TIBC, IRON, RETICCTPCT,  in the last 72  hours Microbiology Recent Results (from the past 240 hour(s))  CULTURE, BLOOD (ROUTINE X 2)     Status: None   Collection Time    07/14/13  4:30 PM      Result Value Range Status   Specimen Description BLOOD RIGHT ARM   Final   Special Requests BOTTLES DRAWN AEROBIC AND ANAEROBIC 5CC   Final   Culture  Setup Time     Final   Value: 07/14/2013 22:32     Performed at [facility]   Culture     Final   Value:        BLOOD CULTURE RECEIVED NO GROWTH TO DATE CULTURE WILL BE HELD FOR 5 DAYS BEFORE ISSUING A FINAL NEGATIVE REPORT     Performed at [facility]   Report Status PENDING   Incomplete  CULTURE, BLOOD (ROUTINE X 2)     Status: None   Collection Time    07/14/13  4:35 PM      Result Value Range Status   Specimen Description BLOOD LEFT ARM   Final   Special Requests BOTTLES DRAWN AEROBIC AND ANAEROBIC 5CC   Final   Culture  Setup Time     Final   Value: 07/14/2013 22:31     Performed at [facility]   Culture     Final   Value:        BLOOD CULTURE RECEIVED NO GROWTH TO DATE CULTURE WILL BE HELD FOR 5 DAYS BEFORE ISSUING A FINAL NEGATIVE REPORT     Performed at [facility]   Report Status PENDING   Incomplete     Procedures and Diagnostic Studies: Dg Chest 2 View  07/14/2013   CLINICAL DATA:  sickle cell disease with pain  EXAM: CHEST  2 VIEW  COMPARISON:  June 03, 2013  FINDINGS: The central catheter is been removed. There is no edema or consolidation. Heart is mildly enlarged with normal pulmonary vascularity. No adenopathy. No pneumothorax.  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 There are multiple endplate the infarcts in the thoracic and lumbar spine consistent with known sickle cell disease.  IMPRESSION: No edema or consolidation. Stable cardiac prominence. Bony changes present consistent with known sickle cell disease.   Electronically Signed   By: Bretta Bang M.D.   On: 07/14/2013 14:05    Scheduled Meds: .  enoxaparin (LOVENOX) injection  40 mg Subcutaneous Q24H  . fluticasone  1 spray Each Nare Daily  . folic acid  1 mg Oral q morning - 10a  . HYDROmorphone PCA 0.3 mg/mL   Intravenous Q4H  . hydroxyurea  2,000 mg Oral q morning - 10a  . ketorolac  30 mg Intravenous Q6H  . morphine  60 mg Oral BID  . senna-docusate  1 tablet Oral BID  . sodium chloride  10-40 mL Intracatheter Q12H   Continuous Infusions: . sodium  chloride 50 mL/hr at 07/19/13 0459    Time spent: 25 minutes.   LOS: 5 days   RAMA,CHRISTINA  [facility] Pager [phone].   *Please note that the hospitalists switch teams on Wednesdays. Please call the flow manager at [phone] if you are having difficulty reaching the hospitalist taking care of this patient as she can update you and provide the most up-to-date pager number of provider caring for the patient. If 8PM-8AM, please contact night-coverage at [email or web address], password [facility]  07/19/2013, 12:20 PM

## 2013-07-19 NOTE — Progress Notes (Signed)
MD, Patient had a critical HGB of 5.3 this am.  On call provider was notified and orders were received this morning for type and screen and transfuse 2 units of PRBCs.  Patient was type and screened per order.  Patient wants to hold off from receiving blood at this time until the hospitalist can consult with his hematologist.  Patient sees a hematologist in the [place] area.  Patient reports he has a history of several antibodies and transfusion reactions in the past.  He further reports that his hematologist usually waits until it is extremely urgent to transfuse blood in his case.  [name] says that Dr. Cyndie Chime has consulted in the past.  Day shift RN was notified and will follow up with MD and patient regarding ultimately whether or not to transfuse at this point.  Will continue to monitor patient.Dennis Bernard

## 2013-07-20 ENCOUNTER — Other Ambulatory Visit: Payer: Self-pay

## 2013-07-20 DIAGNOSIS — D5700 Hb-SS disease with crisis, unspecified: Secondary | ICD-10-CM | POA: Diagnosis not present

## 2013-07-20 DIAGNOSIS — M549 Dorsalgia, unspecified: Secondary | ICD-10-CM | POA: Diagnosis not present

## 2013-07-20 LAB — CULTURE, BLOOD (ROUTINE X 2)
Culture: NO GROWTH
Culture: NO GROWTH

## 2013-07-20 LAB — LACTATE DEHYDROGENASE: LDH: 359 U/L — ABNORMAL HIGH (ref 94–250)

## 2013-07-20 LAB — COMPREHENSIVE METABOLIC PANEL
ALT: 11 U/L (ref 0–53)
AST: 30 U/L (ref 0–37)
Albumin: 3.4 g/dL — ABNORMAL LOW (ref 3.5–5.2)
BUN: 10 mg/dL (ref 6–23)
Chloride: 110 mEq/L (ref 96–112)
Creatinine, Ser: 0.86 mg/dL (ref 0.50–1.35)
GFR calc non Af Amer: >90 mL/min (ref >90)
Sodium: 142 mEq/L (ref 135–145)
Total Bilirubin: 1.3 mg/dL — ABNORMAL HIGH (ref 0.3–1.2)
Total Protein: 6.4 g/dL (ref 6.0–8.3)

## 2013-07-20 LAB — RETICULOCYTES
RBC.: 1.49 MIL/uL — ABNORMAL LOW (ref 4.22–5.81)
Retic Count, Absolute: 37.3 10*3/uL (ref 19.0–186.0)
Retic Ct Pct: 2.5 % (ref 0.4–3.1)

## 2013-07-20 LAB — CBC
HCT: 13.8 % — ABNORMAL LOW (ref 39.0–52.0)
MCH: 32.9 pg (ref 26.0–34.0)
MCV: 92.6 fL (ref 78.0–100.0)
Platelets: 337 10*3/uL (ref 150–400)
RBC: 1.49 MIL/uL — ABNORMAL LOW (ref 4.22–5.81)
RDW: 17.3 % — ABNORMAL HIGH (ref 11.5–15.5)
WBC: 9.9 10*3/uL (ref 4.0–10.5)

## 2013-07-20 MED ORDER — SODIUM CHLORIDE 0.9 % IJ SOLN: 9.0000 mL | INTRAMUSCULAR | Status: DC | PRN

## 2013-07-20 MED ORDER — HYDROMORPHONE 0.3 MG/ML IV SOLN
INTRAVENOUS | Status: DC
  Administered 2013-07-20: 22:00:00 via INTRAVENOUS
  Administered 2013-07-20: 5.68 mg via INTRAVENOUS
  Administered 2013-07-21: 7.31 mg via INTRAVENOUS
  Administered 2013-07-21: 7.48 mg via INTRAVENOUS
  Administered 2013-07-21 (×2): via INTRAVENOUS
  Administered 2013-07-21: 6.65 mg via INTRAVENOUS
  Administered 2013-07-21: 2 mg via INTRAVENOUS
  Administered 2013-07-21 (×3): via INTRAVENOUS
  Administered 2013-07-21: 11.9 mg via INTRAVENOUS
  Administered 2013-07-22 (×2): via INTRAVENOUS
  Administered 2013-07-22: 11.47 mg via INTRAVENOUS
  Administered 2013-07-22: 6.99 mg via INTRAVENOUS
  Administered 2013-07-22: 08:00:00 via INTRAVENOUS
  Administered 2013-07-22: 1.99 mg via INTRAVENOUS
  Administered 2013-07-22: 8.98 mg via INTRAVENOUS
  Administered 2013-07-22 (×2): via INTRAVENOUS
  Administered 2013-07-22: 11.24 mg via INTRAVENOUS
  Administered 2013-07-22 – 2013-07-23 (×2): via INTRAVENOUS
  Administered 2013-07-23: 6.99 mg via INTRAVENOUS
  Administered 2013-07-23: 08:00:00 via INTRAVENOUS
  Administered 2013-07-23: 9.26 mg via INTRAVENOUS
  Administered 2013-07-23: 1.49 mg via INTRAVENOUS
  Administered 2013-07-23: 5.86 mg via INTRAVENOUS
  Administered 2013-07-23 (×3): via INTRAVENOUS
  Administered 2013-07-24: 5.99 mg via INTRAVENOUS
  Administered 2013-07-24: 11.5 mg via INTRAVENOUS
  Administered 2013-07-24: 2 mg via INTRAVENOUS
  Administered 2013-07-24 (×3): via INTRAVENOUS
  Administered 2013-07-24: 9.6 mg via INTRAVENOUS
  Administered 2013-07-24: 5.6 mg via INTRAVENOUS
  Administered 2013-07-24: 6.7 mg via INTRAVENOUS
  Administered 2013-07-24 – 2013-07-25 (×4): via INTRAVENOUS
  Administered 2013-07-25: 18.75 mg via INTRAVENOUS
  Administered 2013-07-25: 10.63 mg via INTRAVENOUS
  Administered 2013-07-25 (×3): via INTRAVENOUS
  Administered 2013-07-25: 3 mg via INTRAVENOUS
  Administered 2013-07-25 (×2): via INTRAVENOUS
  Administered 2013-07-25: 11.12 mg via INTRAVENOUS
  Administered 2013-07-25: 5.62 mg via INTRAVENOUS
  Administered 2013-07-25: 7.45 mg via INTRAVENOUS
  Administered 2013-07-25 – 2013-07-26 (×2): via INTRAVENOUS
  Administered 2013-07-26: 11.47 mg via INTRAVENOUS
  Administered 2013-07-26: 7.66 mg via INTRAVENOUS
  Administered 2013-07-26 (×3): via INTRAVENOUS
  Administered 2013-07-26: 18.11 mg via INTRAVENOUS
  Administered 2013-07-26: 1.99 mg via INTRAVENOUS
  Administered 2013-07-26 (×2): via INTRAVENOUS
  Administered 2013-07-27: 6.99 mg via INTRAVENOUS
  Administered 2013-07-27: 9.66 mg via INTRAVENOUS
  Administered 2013-07-27: 18:00:00 via INTRAVENOUS
  Administered 2013-07-27: 6.67 mg via INTRAVENOUS
  Administered 2013-07-27: 7.49 mg via INTRAVENOUS
  Administered 2013-07-27 (×3): via INTRAVENOUS
  Administered 2013-07-27: 9.63 mg via INTRAVENOUS
  Administered 2013-07-27: 11:00:00 via INTRAVENOUS
  Administered 2013-07-28: 15.12 mg via INTRAVENOUS
  Administered 2013-07-28: 11.16 mg via INTRAVENOUS
  Administered 2013-07-28: 4.58 mg via INTRAVENOUS
  Administered 2013-07-28: 7.6 mg via INTRAVENOUS
  Administered 2013-07-28 (×2): via INTRAVENOUS
  Administered 2013-07-28: 6.49 mg via INTRAVENOUS
  Administered 2013-07-28: 4 mg via INTRAVENOUS
  Administered 2013-07-28 (×3): via INTRAVENOUS
  Administered 2013-07-28: 9.5 mg via INTRAVENOUS
  Administered 2013-07-29: 8.49 mg via INTRAVENOUS
  Administered 2013-07-29 (×2): via INTRAVENOUS
  Administered 2013-07-29: 4.99 mg via INTRAVENOUS
  Administered 2013-07-29: 8.63 mg via INTRAVENOUS
  Administered 2013-07-29: 7.52 mg via INTRAVENOUS
  Filled 2013-07-20 (×56): qty 25

## 2013-07-20 MED ORDER — DIPHENHYDRAMINE HCL 50 MG/ML IJ SOLN
25.0000 mg | INTRAMUSCULAR | Status: DC | PRN
  Administered 2013-07-20 – 2013-07-29 (×30): 25 mg via INTRAVENOUS
  Filled 2013-07-20 (×32): qty 0.5

## 2013-07-20 MED ORDER — SODIUM CHLORIDE 0.9 % IJ SOLN
10.0000 mL | INTRAMUSCULAR | Status: DC | PRN
  Administered 2013-07-24: 10 mL

## 2013-07-20 MED ORDER — SODIUM CHLORIDE 0.9 % IJ SOLN
10.0000 mL | Freq: Two times a day (BID) | INTRAMUSCULAR | Status: DC
  Administered 2013-07-21 – 2013-07-29 (×13): 10 mL

## 2013-07-20 MED ORDER — ACETAMINOPHEN 325 MG PO TABS
650.0000 mg | ORAL_TABLET | Freq: Four times a day (QID) | ORAL | Status: DC | PRN
  Administered 2013-07-20 – 2013-07-21 (×3): 650 mg via ORAL
  Filled 2013-07-20 (×3): qty 2

## 2013-07-20 MED ORDER — ONDANSETRON HCL 4 MG/2ML IJ SOLN
4.0000 mg | Freq: Four times a day (QID) | INTRAMUSCULAR | Status: DC | PRN
  Administered 2013-07-26: 4 mg via INTRAVENOUS
  Filled 2013-07-20 (×3): qty 2

## 2013-07-20 MED ORDER — NALOXONE HCL 0.4 MG/ML IJ SOLN: 0.4000 mg | INTRAMUSCULAR | Status: DC | PRN

## 2013-07-20 NOTE — Progress Notes (Addendum)
Hydrea has been held today 11/6 for Hgb < 6 (decreased to 4.9); Retic count 37.3  Hydroxyurea (Hydrea) hold criteria  ANC < 2  Pltc < 80K in sickle-cell patients; < 100K in other patients  Hgb <= 6 in sickle-cell patients; < 8 in other patients  Reticulocytes < 80K when Hgb < 9   Pharmacy will f/u resume when appropriate   Mivaan Corbitt, Loma Messing PharmD Pager #: [phone] 9:24 AM 07/20/2013

## 2013-07-20 NOTE — Progress Notes (Signed)
Pt c/o chills & feeling feverish at 1645. T=98.9. Rechecked at 1700 & t-99. Page to dr.Rama sent w/callback from Dr S.Krishnan(covering MD). Received Tylenol order & OK to transfuse despite temp. Order.Hartley Barefoot

## 2013-07-20 NOTE — Progress Notes (Signed)
Peripherally Inserted Central Catheter/Midline Placement  The IV Nurse has discussed with the patient and/or persons authorized to consent for the patient, the purpose of this procedure and the potential benefits and risks involved with this procedure.  The benefits include less needle sticks, lab draws from the catheter and patient may be discharged home with the catheter.  Risks include, but not limited to, infection, bleeding, blood clot (thrombus formation), and puncture of an artery; nerve damage and irregular heat beat.  Alternatives to this procedure were also discussed.  PICC/Midline Placement Documentation  PICC / Midline Single Lumen PICC Right (Active)     PICC / Midline Single Lumen 07/17/13 Right Brachial 11 cm 0 cm (Active)  Indication for Insertion or Continuance of Line Poor Vasculature-patient has had multiple peripheral attempts or PIVs lasting less than 24 hours 07/19/2013 12:15 PM  Exposed Catheter (cm) 0 cm 07/17/2013  5:25 PM  Site Assessment Clean;Dry;Intact 07/20/2013  8:15 AM  Line Status Infusing 07/20/2013  8:15 AM  Dressing Type Transparent 07/20/2013  8:15 AM  Dressing Status Clean;Dry;Intact 07/20/2013  8:15 AM  Line Care Connections checked and tightened 07/20/2013  8:15 AM  Dressing Change Due 07/24/13 07/19/2013 12:15 PM       Vevelyn Pat 07/20/2013, 3:57 PM

## 2013-07-20 NOTE — Progress Notes (Addendum)
[facility] PROGRESS NOTE  Dennis Bernard WUJ:[id number] DOB: March 07, 1986 DOA: 07/14/2013 PCP: Dr. Craig Staggers, Teodoro Kil  Brief narrative: Dennis Bernard is an 27 y.o. male with a PMH of sickle cell anemia who was admitted 07/14/2013 with a vaso-occlusive crisis.  Assessment/Plan: Principal Problem:  Sickle cell pain crisis / backache  -Pain management: Continue long acting medications at usual home dose: MS Contin 60 mg Q 12 hours. Avoid increasing dose further to prevent further issues with tolerance.  -Continue custom dose PCA dilaudid while in acute crisis. Current PCA settings: 1 Q 10 minutes, 10 mg/4 hour lock out.  -Continue Toradol IV Q 6 hours for anti-inflammatory effect. Monitor renal function.  -Adjuvant pain therapy: None.  -Wean IV narcotics when pain rated 7/10. Pain rated 8/10 today. -When able to tolerate oral therapy, convert at 50-75% of IV dose & give PRNs Q 2-3 hours.  -Monitor CBC (add differential if on Hydrea) Q 2-3 day(s).  -Monitor bilirubin/LDH Q 72 hours to monitor hemolysis. Current bilirubin: 1.3, LDH 359.  -No evidence of aplastic anemia. Reticulocyte count abnormally low for the degree of hemolysis.  Discussed with Dr. Cyndie Chime who recommends repeating level in a.m. And checking Parvovirus titers. -Ferritin level last checked 03/23/13, and was 618.  -Hold Hydrea given hemoglobin < 6 mg/dL and reticulocyte count < 80K when hemoglobin < 9 mg/dL.  -Continue IVF: 1/2 NS at 50 cc/hr. Volume status: Slightly dry with poor intake. D/C IVF when hydrated and PO intake improved.  -K pad PRN.  -Continue folic acid.  Active problems:  Constipation induced by narcotics  -Senna-S for pro motility effect.  -Increase mobilization as tolerated.  Sickle cell anemia  -Patient's baseline hemoglobin is: 9 mg/dL  -Patient's transfusion hemoglobin threshold is: 5-6 mg/dL, history of delayed transfusion reaction.  -Current hemoglobin is: 4.9 mg/dL. we'll give 1  unit of the least incompatible blood available. Fever  -Followup blood cultures, which are negative to date. If these remain negative, can consider replacement of Port-A-Cath which was removed during a previous hospitalization secondary to infection.  Code Status: Full.  Family Communication: No family at bedside.  Disposition Plan: Home when stable.  Medical Consultants:  None.  Other Consultants:  None.  Anti-infectives:  None.  HPI/Subjective: Dennis Bernard tells me his back pain is currently rated 8/10 but that going up on the pain medication has helped control it. No nausea or vomiting. Appetite fair. He states his bowels are moving. Mildly short of breath.  Objective: Filed Vitals:   07/19/13 2015 07/20/13 0005 07/20/13 0614 07/20/13 0618  BP: 96/50  98/57   Pulse: 83  86   Temp: 98.3 F (36.8 C)  98.9 F (37.2 C)   TempSrc: Oral  Oral   Resp: 18 16 18 18   Height:      Weight:      SpO2: 100% 96% 99% 100%    Intake/Output Summary (Last 24 hours) at 07/20/13 0745 Last data filed at 07/20/13 0618  Gross per 24 hour  Intake 852.67 ml  Output   2425 ml  Net -1572.33 ml    Exam: Gen:  NAD Cardiovascular:  RRR, No M/R/G Respiratory:  Lungs CTAB Gastrointestinal:  Abdomen soft, NT/ND, + BS Extremities:  No C/E/C  Data Reviewed: Basic Metabolic Panel:  Recent Labs Lab 07/14/13 1338 07/17/13 0535 07/20/13 0530  NA 137 135 142  K 3.8 4.1 4.3  CL 106 103 110  CO2 19 20 25   GLUCOSE 97 78 94  BUN 17 11  10  CREATININE 1.11 0.73 0.86  CALCIUM 10.0 9.2 8.7   GFR Estimated Creatinine Clearance: 127.9 ml/min (by C-G formula based on Cr of 0.86). Liver Function Tests:  Recent Labs Lab 07/14/13 1338 07/17/13 0535 07/20/13 0530  AST 29 24 30   ALT 17 9 11   ALKPHOS 106 86 63  BILITOT 1.4* 1.0 1.3*  PROT 8.7* 7.8 6.4  ALBUMIN 4.7 4.2 3.4*   CBC:  Recent Labs Lab 07/14/13 1338 07/17/13 0535 07/19/13 0500 07/20/13 0530  WBC 13.1* 12.0* 8.7  9.9  NEUTROABS 8.7* 7.8*  --   --   HGB 7.5* 7.7* 5.3* 4.9*  HCT 22.0* 22.6* 14.7* 13.8*  MCV 98.7 94.2 92.5 92.6  PLT 593* 526* 360 337   Anemia work up  Recent Labs  07/20/13 0530  RETICCTPCT 2.5   Microbiology Recent Results (from the past 240 hour(s))  CULTURE, BLOOD (ROUTINE X 2)     Status: None   Collection Time    07/14/13  4:30 PM      Result Value Range Status   Specimen Description BLOOD RIGHT ARM   Final   Special Requests BOTTLES DRAWN AEROBIC AND ANAEROBIC 5CC   Final   Culture  Setup Time     Final   Value: 07/14/2013 22:32     Performed at [facility]   Culture     Final   Value:        BLOOD CULTURE RECEIVED NO GROWTH TO DATE CULTURE WILL BE HELD FOR 5 DAYS BEFORE ISSUING A FINAL NEGATIVE REPORT     Performed at [facility]   Report Status PENDING   Incomplete  CULTURE, BLOOD (ROUTINE X 2)     Status: None   Collection Time    07/14/13  4:35 PM      Result Value Range Status   Specimen Description BLOOD LEFT ARM   Final   Special Requests BOTTLES DRAWN AEROBIC AND ANAEROBIC 5CC   Final   Culture  Setup Time     Final   Value: 07/14/2013 22:31     Performed at [facility]   Culture     Final   Value:        BLOOD CULTURE RECEIVED NO GROWTH TO DATE CULTURE WILL BE HELD FOR 5 DAYS BEFORE ISSUING A FINAL NEGATIVE REPORT     Performed at [facility]   Report Status PENDING   Incomplete     Procedures and Diagnostic Studies: Dg Chest 2 View  07/14/2013   CLINICAL DATA:  sickle cell disease with pain  EXAM: CHEST  2 VIEW  COMPARISON:  June 03, 2013  FINDINGS: The central catheter is been removed. There is no edema or consolidation. Heart is mildly enlarged with normal pulmonary vascularity. No adenopathy. No pneumothorax.  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 There are multiple endplate the infarcts in the thoracic and lumbar spine consistent with known sickle cell disease.  IMPRESSION: No edema or  consolidation. Stable cardiac prominence. Bony changes present consistent with known sickle cell disease.   Electronically Signed   By: Bretta Bang M.D.   On: 07/14/2013 14:05    Scheduled Meds: . enoxaparin (LOVENOX) injection  40 mg Subcutaneous Q24H  . fluticasone  1 spray Each Nare Daily  . folic acid  1 mg Oral q morning - 10a  . HYDROmorphone PCA 0.3 mg/mL   Intravenous Q4H  . hydroxyurea  2,000 mg Oral q morning -  10a  . morphine  60 mg Oral BID  . senna-docusate  1 tablet Oral BID  . sodium chloride  10-40 mL Intracatheter Q12H   Continuous Infusions: . sodium chloride 50 mL/hr at 07/19/13 1737    Time spent: 25 minutes.   LOS: 6 days   Dennis Bernard  [facility] Pager [phone].   *Please note that the hospitalists switch teams on Wednesdays. Please call the flow manager at [phone] if you are having difficulty reaching the hospitalist taking care of this patient as she can update you and provide the most up-to-date pager number of provider caring for the patient. If 8PM-8AM, please contact night-coverage at [email or web address], password [facility]  07/20/2013, 7:45 AM

## 2013-07-21 ENCOUNTER — Inpatient Hospital Stay (HOSPITAL_COMMUNITY): Payer: Medicare Other

## 2013-07-21 DIAGNOSIS — R6510 Systemic inflammatory response syndrome (SIRS) of non-infectious origin without acute organ dysfunction: Secondary | ICD-10-CM

## 2013-07-21 DIAGNOSIS — K5900 Constipation, unspecified: Secondary | ICD-10-CM

## 2013-07-21 LAB — COMPREHENSIVE METABOLIC PANEL
Albumin: 3.9 g/dL (ref 3.5–5.2)
Alkaline Phosphatase: 77 U/L (ref 39–117)
BUN: 11 mg/dL (ref 6–23)
Calcium: 8.6 mg/dL (ref 8.4–10.5)
Chloride: 106 mEq/L (ref 96–112)
Creatinine, Ser: 0.97 mg/dL (ref 0.50–1.35)
GFR calc Af Amer: >90 mL/min (ref >90)
Glucose, Bld: 97 mg/dL (ref 70–99)
Total Bilirubin: 3.2 mg/dL — ABNORMAL HIGH (ref 0.3–1.2)
Total Protein: 7.1 g/dL (ref 6.0–8.3)

## 2013-07-21 LAB — CBC
HCT: 17.3 % — ABNORMAL LOW (ref 39.0–52.0)
HCT: 16.1 % — ABNORMAL LOW (ref 39.0–52.0)
Hemoglobin: 5.8 g/dL — CL (ref 13.0–17.0)
MCH: 33 pg (ref 26.0–34.0)
MCHC: 35.8 g/dL (ref 30.0–36.0)
MCHC: 36 g/dL (ref 30.0–36.0)
MCV: 92 fL (ref 78.0–100.0)
MCV: 91.5 fL (ref 78.0–100.0)
Platelets: 366 10*3/uL (ref 150–400)
Platelets: 323 10*3/uL (ref 150–400)
RBC: 1.88 MIL/uL — ABNORMAL LOW (ref 4.22–5.81)
RBC: 1.76 MIL/uL — ABNORMAL LOW (ref 4.22–5.81)
RDW: 17 % — ABNORMAL HIGH (ref 11.5–15.5)
RDW: 17.4 % — ABNORMAL HIGH (ref 11.5–15.5)
WBC: 16.1 10*3/uL — ABNORMAL HIGH (ref 4.0–10.5)

## 2013-07-21 LAB — HEPATIC FUNCTION PANEL
ALT: 13 U/L (ref 0–53)
Albumin: 3.7 g/dL (ref 3.5–5.2)
Alkaline Phosphatase: 76 U/L (ref 39–117)
Bilirubin, Direct: 0.4 mg/dL — ABNORMAL HIGH (ref 0.0–0.3)
Indirect Bilirubin: 2.7 mg/dL — ABNORMAL HIGH (ref 0.3–0.9)
Total Bilirubin: 3.1 mg/dL — ABNORMAL HIGH (ref 0.3–1.2)
Total Protein: 7 g/dL (ref 6.0–8.3)

## 2013-07-21 LAB — RETICULOCYTES
RBC.: 1.9 MIL/uL — ABNORMAL LOW (ref 4.22–5.81)
Retic Count, Absolute: 57 10*3/uL (ref 19.0–186.0)

## 2013-07-21 MED ORDER — ACETAMINOPHEN 325 MG PO TABS: 325.0000 mg | ORAL_TABLET | ORAL | Status: AC

## 2013-07-21 MED ORDER — POLYETHYLENE GLYCOL 3350 17 G PO PACK
17.0000 g | PACK | Freq: Every day | ORAL | Status: AC
  Administered 2013-07-21 – 2013-07-22 (×2): 17 g via ORAL
  Filled 2013-07-21 (×2): qty 1

## 2013-07-21 MED ORDER — PROMETHAZINE HCL 25 MG/ML IJ SOLN
25.0000 mg | Freq: Once | INTRAMUSCULAR | Status: AC
  Administered 2013-07-21: 25 mg via INTRAVENOUS
  Filled 2013-07-21: qty 1

## 2013-07-21 MED ORDER — DEXTROSE 5 % IV SOLN
500.0000 mg | INTRAVENOUS | Status: DC
  Administered 2013-07-21 – 2013-07-24 (×4): 500 mg via INTRAVENOUS
  Filled 2013-07-21 (×4): qty 500

## 2013-07-21 NOTE — Progress Notes (Signed)
CRITICAL VALUE ALERT  Critical value received:  Hmgb  Date of notification:  07-21-13  Time of notification:  1230  Critical value read back:yes  Nurse who received alert:  cb Tammi Sou  MD notified (1st page):  Feliz-Ortez  Time of first page:  MD present on floor  MD notified (2nd page):  Time of second page:  Responding MD:    Time MD responded:

## 2013-07-21 NOTE — Progress Notes (Signed)
Paged NP on call about recent labs. Elevated WBC and bilirubin.

## 2013-07-21 NOTE — Progress Notes (Addendum)
[facility] PROGRESS NOTE Interim History: 27 y.o. male with a PMH of sickle cell anemia who was admitted 07/14/2013 with a vaso-occlusive crisis    Assessment/Plan: Sickle cell anemia/Sickle cell pain crisis/SIRS/  Fever, unspecified: - Pain management: Continue long acting medications at usual home dose: MS Contin 60 mg Q 12 hours. - Continue custom dose PCA dilaudid while in acute crisis - Monitor bilirubin/LDH Q 72 hours to monitor hemolysis - Leukocytosis and fevers due transfusion reaction.  - CXR: Mild cardiomegaly with pulmonary venous congestion, initial BC remained negative, repeated UC and BC pending, will not start empiric antibiotics.   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  -Increase mobilization as tolerated.    Code Status: Full.  Family Communication: No family at bedside.  Disposition Plan: Home when stable.  Medical Consultants:  None. Other Consultants:  None. Anti-infectives:  None.    HPI/Subjective: Still rating his pain 8/10  Objective: Filed Vitals:   07/21/13 0555 07/21/13 0645 07/21/13 0743 07/21/13 1020  BP:  114/63  97/52  Pulse:    90  Temp:    98.3 F (36.8 C)  TempSrc:    Oral  Resp: 22  16 16   Height:      Weight:      SpO2: 100%  100% 97%    Intake/Output Summary (Last 24 hours) at 07/21/13 1213 Last data filed at 07/21/13 1020  Gross per 24 hour  Intake 4700.16 ml  Output   4500 ml  Net 200.16 ml   Filed Weights   07/14/13 1243 07/16/13 0500  Weight: 72.576 kg (160 lb) 76.794 kg (169 lb 4.8 oz)    Exam:  General: Alert, awake, oriented x3, in no acute distress.  HEENT: No bruits, no goiter. + JVD Heart: Regular rate and rhythm, without murmurs, rubs, gallops.  Lungs: Good air movement, clear to auscultation. Abdomen: Soft, nontender, nondistended, positive bowel sounds.  Neuro: Grossly intact, nonfocal.   Data Reviewed: Basic Metabolic Panel:  Recent Labs Lab  07/14/13 1338 07/17/13 0535 07/20/13 0530 07/21/13 0140  NA 137 135 142 137  K 3.8 4.1 4.3 4.1  CL 106 103 110 106  CO2 19 20 25 24   GLUCOSE 97 78 94 97  BUN 17 11 10 11   CREATININE 1.11 0.73 0.86 0.97  CALCIUM 10.0 9.2 8.7 8.6   Liver Function Tests:  Recent Labs Lab 07/14/13 1338 07/17/13 0535 07/20/13 0530 07/21/13 0140  AST 29 24 30  34  ALT 17 9 11 13   ALKPHOS 106 86 63 77  BILITOT 1.4* 1.0 1.3* 3.2*  PROT 8.7* 7.8 6.4 7.1  ALBUMIN 4.7 4.2 3.4* 3.9   No results found for this basename: LIPASE, AMYLASE,  in the last 168 hours No results found for this basename: AMMONIA,  in the last 168 hours CBC:  Recent Labs Lab 07/14/13 1338 07/17/13 0535 07/19/13 0500 07/20/13 0530 07/21/13 0140  WBC 13.1* 12.0* 8.7 9.9 16.1*  NEUTROABS 8.7* 7.8*  --   --   --   HGB 7.5* 7.7* 5.3* 4.9* 6.2*  HCT 22.0* 22.6* 14.7* 13.8* 17.3*  MCV 98.7 94.2 92.5 92.6 92.0  PLT 593* 526* 360 337 366   Cardiac Enzymes: No results found for this basename: CKTOTAL, CKMB, CKMBINDEX, TROPONINI,  in the last 168 hours BNP (last 3 results) No results found for this basename: PROBNP,  in the last 8760 hours CBG: No results found for this basename: GLUCAP,  in the last 168 hours  Recent Results (from the past 240 hour(s))  CULTURE, BLOOD (ROUTINE X 2)     Status: None   Collection Time    07/14/13  4:30 PM      Result Value Range Status   Specimen Description BLOOD RIGHT ARM   Final   Special Requests BOTTLES DRAWN AEROBIC AND ANAEROBIC 5CC   Final   Culture  Setup Time     Final   Value: 07/14/2013 22:32     Performed at [facility]   Culture     Final   Value: NO GROWTH 5 DAYS     Performed at [facility]   Report Status 07/20/2013 FINAL   Final  CULTURE, BLOOD (ROUTINE X 2)     Status: None   Collection Time    07/14/13  4:35 PM      Result Value Range Status   Specimen Description BLOOD LEFT ARM   Final   Special Requests BOTTLES DRAWN AEROBIC AND ANAEROBIC  5CC   Final   Culture  Setup Time     Final   Value: 07/14/2013 22:31     Performed at [facility]   Culture     Final   Value: NO GROWTH 5 DAYS     Performed at [facility]   Report Status 07/20/2013 FINAL   Final     Studies: Dg Chest Port 1 View  07/21/2013   CLINICAL DATA:  Sudden onset of fever. History of sickle cell disease.  EXAM: PORTABLE CHEST - 1 VIEW  COMPARISON:  Chest x-ray 07/14/2013.  FINDINGS: There is a right upper extremity PICC with tip terminating in the mid superior vena cava. Lung volumes are slightly low. No consolidative airspace disease. Diffuse peribronchial cuffing. No pleural effusions. Mild cephalization of the pulmonary vasculature. Mild cardiomegaly. The patient is rotated to the right on today's exam, resulting in distortion of the mediastinal contours and reduced diagnostic sensitivity and specificity for mediastinal pathology. Postoperative changes of left shoulder hemiarthroplasty.  IMPRESSION: 1. Diffuse peribronchial cuffing may suggest bronchitis. 2. Mild cardiomegaly with pulmonary venous congestion, but no frank pulmonary edema. 3. Postoperative changes and support apparatus, as above.   Electronically Signed   By: Trudie Reed M.D.   On: 07/21/2013 03:19    Scheduled Meds: . azithromycin  500 mg Intravenous Q24H  . enoxaparin (LOVENOX) injection  40 mg Subcutaneous Q24H  . fluticasone  1 spray Each Nare Daily  . folic acid  1 mg Oral q morning - 10a  . HYDROmorphone PCA 0.3 mg/mL   Intravenous Q4H  . morphine  60 mg Oral BID  . senna-docusate  1 tablet Oral BID  . sodium chloride  10-40 mL Intracatheter Q12H  . sodium chloride  10-40 mL Intracatheter Q12H   Continuous Infusions: . sodium chloride 1,000 mL (07/21/13 0303)     Marinda Elk  [facility] Pager [phone]. If 8PM-8AM, please contact night-coverage at [email or web address], password [facility] 07/21/2013, 12:13 PM  LOS: 7 days

## 2013-07-21 NOTE — Progress Notes (Signed)
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 From my ongoing discussions with the blood bank staff, this is not an. antigen that they usually check for and is not common in the sickle patient population. The only documentation that I antigen may be a problem was from a molecular analysis done at [facility] a number of years ago. [facility] is researching this further on a national level to see if this antigen has been a problem in sickle populations in other areas.  He tolerated the transfusion but did have a febrile reaction despite Tylenol and Benadryl premedication. Hemoglobin up from 4.9 to 6.2 this morning which is a nice result.  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 Recommendation: Minimize transfusion therapy. Give but only if hemoglobin 5 g or less. Use least incompatible units. Given the febrile reaction to transfusion last night, I would routinely add Solu-Medrol 40 mg IV as premedication to subsequent transfusions.

## 2013-07-21 NOTE — Progress Notes (Signed)
Shift event: RN paged NP secondary to pt spiking a fever of 101.66F about 2 hours after one unit blood transfused. Pt with hx of delayed hemolytic transfusion reactions. Stat labs obtained show improved Hgb, increased WBCC to 16,000, and increased bilirubin to 3.2. CXR obtained which showed evidence of possible bronchitis. Given fever, start Zithromax IV. Restarted IVF at 100cc/hr. Pt without respiratory distress or chest pain. Repeat labs later today. [name], NP [facility] Above discussed with Dr. Vania Rea of [facility] who agrees with actions.

## 2013-07-21 NOTE — Progress Notes (Signed)
Paged NP on call about fever <2 hours after blood completed. Fever 101.3.

## 2013-07-22 DIAGNOSIS — J209 Acute bronchitis, unspecified: Secondary | ICD-10-CM | POA: Diagnosis not present

## 2013-07-22 DIAGNOSIS — R0989 Other specified symptoms and signs involving the circulatory and respiratory systems: Secondary | ICD-10-CM | POA: Diagnosis not present

## 2013-07-22 LAB — URINALYSIS, ROUTINE W REFLEX MICROSCOPIC
Bilirubin Urine: NEGATIVE
Glucose, UA: NEGATIVE mg/dL
Ketones, ur: NEGATIVE mg/dL
Leukocytes, UA: NEGATIVE
Nitrite: NEGATIVE
Protein, ur: NEGATIVE mg/dL
Urobilinogen, UA: 1 mg/dL (ref 0.0–1.0)
pH: 6 (ref 5.0–8.0)

## 2013-07-22 LAB — URINE CULTURE
Colony Count: 2000
Special Requests: NORMAL

## 2013-07-22 MED ORDER — POTASSIUM CHLORIDE CRYS ER 20 MEQ PO TBCR
20.0000 meq | EXTENDED_RELEASE_TABLET | Freq: Two times a day (BID) | ORAL | Status: DC
  Administered 2013-07-22 – 2013-07-29 (×15): 20 meq via ORAL
  Filled 2013-07-22 (×16): qty 1

## 2013-07-22 NOTE — Plan of Care (Signed)
Problem: Phase I Progression Outcomes Goal: Bowel Movement At Least Every 3 Days Outcome: Progressing On bowel regimen

## 2013-07-22 NOTE — Progress Notes (Addendum)
[facility] PROGRESS NOTE  Braheem Tomasik WUJ:[id number] DOB: [birth year] DOA: 07/14/2013 PCP: Dr. Craig Staggers, Teodoro Kil  Brief narrative: Dennis Bernard is an 27 y.o. male with a PMH of sickle cell anemia who was admitted 07/14/2013 with a vaso-occlusive crisis.  Assessment/Plan: Principal Problem:  Sickle cell pain crisis / backache  -Pain management: Continue long acting medications at usual home dose: MS Contin 60 mg Q 12 hours. Avoid increasing dose further to prevent further issues with tolerance.  -Continue custom dose PCA dilaudid while in acute crisis. Current PCA settings: 1 Q 10 minutes, 10 mg/4 hour lock out.  -Status post Toradol IV Q 6 hours x 5 days for anti-inflammatory effect.  -Adjuvant pain therapy: None.  -Wean IV narcotics when pain rated 7/10. Pain rated 8/10 today. -When able to tolerate oral therapy, convert at 50-75% of IV dose & give PRNs Q 2-3 hours.  -Monitor CBC (add differential if on Hydrea) Q 2-3 day(s).  -Monitor bilirubin/LDH Q 72 hours to monitor hemolysis. Current bilirubin: 1.3, LDH 359.  -No evidence of aplastic anemia. Reticulocyte count abnormally low for the degree of hemolysis.  Discussed with Dr. Cyndie Chime who recommended repeating level 07/21/13, still low but slightly improved.  Parvovirus titers pending. -Ferritin level last checked 03/23/13, and was 618.  -Hold Hydrea given hemoglobin < 6 mg/dL and reticulocyte count < 80K when hemoglobin < 9 mg/dL.  -KVO IVF.  -K pad PRN.  -Continue folic acid.  Active problems:  Pulmonary venous congestion Noted on chest x-ray done 07/21/2013. No frank pulmonary edema. KVO IV fluids. Hypokalemia Supplement orally. Constipation induced by narcotics  -Senna-S for pro motility effect.  -Increase mobilization as tolerated.  Sickle cell anemia  -Patient's baseline hemoglobin is: 9 mg/dL  -Patient's transfusion hemoglobin threshold is: <5 mg/dL, history of delayed transfusion reaction with  multiple alloantibody formation. Give Solu-Medrol 40 mg IV as premedication if subsequent transfusion needed. -Current hemoglobin is: 5.8 mg/dL. He was given one unit of blood on 07/20/2013. Fever / SIRS / Acute bronchitis -Blood cultures done on admission are negative. Consider replacement of Port-A-Cath which was removed during a previous hospitalization secondary to infection. Spiked another fever after blood transfusion. Chest x-ray done 07/21/2013 negative for pneumonia, but suspicious for bronchitis.  Azithromycin started. Check urinalysis.  Code Status: Full.  Family Communication: No family at bedside.  Disposition Plan: Home when stable.  IV access:   PICC line placed 07/20/2013  Medical Consultants:  None.  Other Consultants:  None.  Anti-infectives:  Azithromycin 07/21/13--->  HPI/Subjective: Dennis Bernard is about his fevers, which are mainly occurring at night. He continues to have back pain. No complaints of shortness of breath or cough. No sore throat. No dysuria although he has noticed dark colored urine.  Objective: Filed Vitals:   07/22/13 0320 07/22/13 0352 07/22/13 0540 07/22/13 0602  BP:    99/55  Pulse:    86  Temp:    98.2 F (36.8 C)  TempSrc:    Oral  Resp: 22 20 18 21   Height:      Weight:      SpO2: 95% 96%  98%    Intake/Output Summary (Last 24 hours) at 07/22/13 9562 Last data filed at 07/22/13 1308  Gross per 24 hour  Intake 3328.95 ml  Output   3325 ml  Net   3.95 ml    Exam: Gen:  NAD Cardiovascular:  RRR, No M/R/G Respiratory:  Lungs CTAB Gastrointestinal:  Abdomen soft, NT/ND, + BS Extremities:  No C/E/C  Data Reviewed: Basic Metabolic Panel:  Recent Labs Lab 07/17/13 0535 07/20/13 0530 07/21/13 0140  NA 135 142 137  K 4.1 4.3 4.1  CL 103 110 106  CO2 20 25 24   GLUCOSE 78 94 97  BUN 11 10 11   CREATININE 0.73 0.86 0.97  CALCIUM 9.2 8.7 8.6   GFR Estimated Creatinine Clearance: 113.4 ml/min (by C-G formula  based on Cr of 0.97). Liver Function Tests:  Recent Labs Lab 07/17/13 0535 07/20/13 0530 07/21/13 0140 07/21/13 1230  AST 24 30 34 29  ALT 9 11 13 13   ALKPHOS 86 63 77 76  BILITOT 1.0 1.3* 3.2* 3.1*  PROT 7.8 6.4 7.1 7.0  ALBUMIN 4.2 3.4* 3.9 3.7   CBC:  Recent Labs Lab 07/17/13 0535 07/19/13 0500 07/20/13 0530 07/21/13 0140 07/21/13 1230  WBC 12.0* 8.7 9.9 16.1* 9.1  NEUTROABS 7.8*  --   --   --   --   HGB 7.7* 5.3* 4.9* 6.2* 5.8*  HCT 22.6* 14.7* 13.8* 17.3* 16.1*  MCV 94.2 92.5 92.6 92.0 91.5  PLT 526* 360 337 366 323   Anemia work up  Recent Labs  07/20/13 0530 07/21/13 0140  RETICCTPCT 2.5 3.0   Microbiology Recent Results (from the past 240 hour(s))  CULTURE, BLOOD (ROUTINE X 2)     Status: None   Collection Time    07/14/13  4:30 PM      Result Value Range Status   Specimen Description BLOOD RIGHT ARM   Final   Special Requests BOTTLES DRAWN AEROBIC AND ANAEROBIC 5CC   Final   Culture  Setup Time     Final   Value: 07/14/2013 22:32     Performed at [facility]   Culture     Final   Value: NO GROWTH 5 DAYS     Performed at [facility]   Report Status 07/20/2013 FINAL   Final  CULTURE, BLOOD (ROUTINE X 2)     Status: None   Collection Time    07/14/13  4:35 PM      Result Value Range Status   Specimen Description BLOOD LEFT ARM   Final   Special Requests BOTTLES DRAWN AEROBIC AND ANAEROBIC 5CC   Final   Culture  Setup Time     Final   Value: 07/14/2013 22:31     Performed at [facility]   Culture     Final   Value: NO GROWTH 5 DAYS     Performed at [facility]   Report Status 07/20/2013 FINAL   Final     Procedures and Diagnostic Studies: Dg Chest 2 View  07/14/2013   CLINICAL DATA:  sickle cell disease with pain  EXAM: CHEST  2 VIEW  COMPARISON:  June 03, 2013  FINDINGS: The central catheter is been removed. There is no edema or consolidation. Heart is mildly enlarged with normal pulmonary  vascularity. No adenopathy. No pneumothorax.  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 There are multiple endplate the infarcts in the thoracic and lumbar spine consistent with known sickle cell disease.  IMPRESSION: No edema or consolidation. Stable cardiac prominence. Bony changes present consistent with known sickle cell disease.   Electronically Signed   By: Bretta Bang M.D.   On: 07/14/2013 14:05   Dg Chest Port 1 View  07/21/2013   CLINICAL DATA:  Sudden onset of fever. History of sickle cell disease.  EXAM: PORTABLE CHEST - 1 VIEW  COMPARISON:  Chest x-ray 07/14/2013.  FINDINGS: There is a right upper extremity PICC with tip terminating in the mid superior vena cava. Lung volumes are slightly low. No consolidative airspace disease. Diffuse peribronchial cuffing. No pleural effusions. Mild cephalization of the pulmonary vasculature. Mild cardiomegaly. The patient is rotated to the right on today's exam, resulting in distortion of the mediastinal contours and reduced diagnostic sensitivity and specificity for mediastinal pathology. Postoperative changes of left shoulder hemiarthroplasty.  IMPRESSION: 1. Diffuse peribronchial cuffing may suggest bronchitis. 2. Mild cardiomegaly with pulmonary venous congestion, but no frank pulmonary edema. 3. Postoperative changes and support apparatus, as above.   Electronically Signed   By: Trudie Reed M.D.   On: 07/21/2013 03:19   Scheduled Meds: . azithromycin  500 mg Intravenous Q24H  . enoxaparin (LOVENOX) injection  40 mg Subcutaneous Q24H  . fluticasone  1 spray Each Nare Daily  . folic acid  1 mg Oral q morning - 10a  . HYDROmorphone PCA 0.3 mg/mL   Intravenous Q4H  . morphine  60 mg Oral BID  . polyethylene glycol  17 g Oral Daily  . senna-docusate  1 tablet Oral BID  . sodium chloride  10-40 mL Intracatheter Q12H  . sodium chloride  10-40 mL Intracatheter Q12H   Continuous Infusions: . sodium chloride 1,000 mL (07/22/13 0540)     Time spent: 25 minutes.   LOS: 8 days   Otha Monical  [facility] Pager [phone].   *Please note that the hospitalists switch teams on Wednesdays. Please call the flow manager at [phone] if you are having difficulty reaching the hospitalist taking care of this patient as she can update you and provide the most up-to-date pager number of provider caring for the patient. If 8PM-8AM, please contact night-coverage at [email or web address], password [facility]  07/22/2013, 8:12 AM

## 2013-07-23 LAB — COMPREHENSIVE METABOLIC PANEL
Albumin: 3.5 g/dL (ref 3.5–5.2)
BUN: 14 mg/dL (ref 6–23)
Calcium: 9 mg/dL (ref 8.4–10.5)
Creatinine, Ser: 0.88 mg/dL (ref 0.50–1.35)
GFR calc Af Amer: >90 mL/min (ref >90)
Glucose, Bld: 102 mg/dL — ABNORMAL HIGH (ref 70–99)
Sodium: 138 mEq/L (ref 135–145)
Total Protein: 6.6 g/dL (ref 6.0–8.3)

## 2013-07-23 LAB — CBC
HCT: 13.5 % — ABNORMAL LOW (ref 39.0–52.0)
Hemoglobin: 4.6 g/dL — CL (ref 13.0–17.0)
MCH: 32.9 pg (ref 26.0–34.0)
MCHC: 34.1 g/dL (ref 30.0–36.0)
RDW: 16.9 % — ABNORMAL HIGH (ref 11.5–15.5)

## 2013-07-23 LAB — PREPARE RBC (CROSSMATCH)

## 2013-07-23 MED ORDER — METHYLPREDNISOLONE SODIUM SUCC 40 MG IJ SOLR
40.0000 mg | Freq: Once | INTRAMUSCULAR | Status: AC
  Filled 2013-07-23: qty 1

## 2013-07-23 MED ORDER — KETOROLAC TROMETHAMINE 30 MG/ML IJ SOLN: 30.0000 mg | Freq: Four times a day (QID) | INTRAMUSCULAR | Status: AC | PRN

## 2013-07-23 MED ORDER — FUROSEMIDE 10 MG/ML IJ SOLN
20.0000 mg | Freq: Once | INTRAMUSCULAR | Status: AC
  Administered 2013-07-23: 20 mg via INTRAVENOUS
  Filled 2013-07-23: qty 2

## 2013-07-23 MED ORDER — PROMETHAZINE HCL 25 MG/ML IJ SOLN
12.5000 mg | Freq: Four times a day (QID) | INTRAMUSCULAR | Status: DC | PRN
  Administered 2013-07-23 – 2013-07-29 (×18): 12.5 mg via INTRAVENOUS
  Filled 2013-07-23 (×18): qty 1

## 2013-07-23 NOTE — Progress Notes (Signed)
Per lab Hemoglobin 4.6. Dr. Darnelle Catalan notified.

## 2013-07-23 NOTE — Progress Notes (Signed)
Patient has an order to transfuse 1 unit PRBC's. I just called blood bank to inquire about status of blood. They stated he is a very difficult cross-match and the blood may be ready tonight, or more likely in the am. Patient informed of situation and made aware. We will transfuse as soon as it is available.

## 2013-07-23 NOTE — Progress Notes (Signed)
[facility] PROGRESS NOTE  Mouhamadou Gittleman EAV:[id number] DOB: [birth year] DOA: 07/14/2013 PCP: Dr. Craig Staggers, Teodoro Kil  Brief narrative: Dennis Bernard is an 27 y.o. male with a PMH of sickle cell anemia who was admitted 07/14/2013 with a vaso-occlusive crisis.  Assessment/Plan: Principal Problem:  Sickle cell pain crisis / backache  -Pain management: Continue long acting medications at usual home dose: MS Contin 60 mg Q 12 hours. Avoid increasing dose further to prevent further issues with tolerance.  -Continue custom dose PCA dilaudid while in acute crisis. Current PCA settings: 1 Q 10 minutes, 10 mg/4 hour lock out.  -Status post Toradol IV Q 6 hours x 5 days for anti-inflammatory effect.  -Adjuvant pain therapy: None.  -Wean IV narcotics when pain rated 7/10. Pain rated 8/10 today. -When able to tolerate oral therapy, convert at 50-75% of IV dose & give PRNs Q 2-3 hours.  -Monitor CBC (add differential if on Hydrea) Q 2-3 day(s).  -Monitor bilirubin/LDH Q 72 hours to monitor hemolysis. Current bilirubin: 1.7, LDH 359.  -No evidence of aplastic anemia. Reticulocyte count abnormally low for the degree of hemolysis.  Discussed with Dr. Cyndie Chime who recommended repeating level 07/21/13, still low but slightly improved.  Parvovirus titers pending. -Ferritin level last checked 03/23/13, and was 618.  -Hold Hydrea given hemoglobin < 6 mg/dL and reticulocyte count < 80K when hemoglobin < 9 mg/dL.  -KVO IVF.  -K pad PRN.  -Continue folic acid.  Active problems:  Pulmonary venous congestion Noted on chest x-ray done 07/21/2013. No frank pulmonary edema. KVO IV fluids.  Given LE edema, will give Lasix 20 mg X 1 today. Hypokalemia Supplement orally. Constipation induced by narcotics  -Senna-S for pro motility effect.  -Increase mobilization as tolerated.  Sickle cell anemia  -Patient's baseline hemoglobin is: 9 mg/dL  -Patient's transfusion hemoglobin threshold is: <5 mg/dL,  history of delayed transfusion reaction with multiple alloantibody formation. Give Solu-Medrol 40 mg IV as premedication if subsequent transfusion needed. -Current hemoglobin is: 4.6 mg/dL. Give 1 unit today, with premedication (Solumedrol 40 mg).  He was also given one unit of blood on 07/20/2013. Fever / SIRS / Acute bronchitis -Blood cultures done on admission are negative. Consider replacement of Port-A-Cath which was removed during a previous hospitalization secondary to infection. Spiked another fever after blood transfusion. Chest x-ray done 07/21/2013 negative for pneumonia, but suspicious for bronchitis.  Azithromycin started. U/A negative for nitrites and leukocytes.  Code Status: Full.  Family Communication: No family at bedside.  Disposition Plan: Home when stable.  IV access:   PICC line placed 07/20/2013  Medical Consultants:  None.  Other Consultants:  None.  Anti-infectives:  Azithromycin 07/21/13--->  HPI/Subjective: Dennis Bernard is worried about the drop in his hemoglobin. He is short of breath. Bowels moved a few days ago. Some nausea but no frank vomiting. Complains of lower extremity edema.  Objective: Filed Vitals:   07/23/13 0012 07/23/13 0216 07/23/13 0607 07/23/13 0644  BP:  101/54  114/71  Pulse:  74  96  Temp:  97.6 F (36.4 C)  98.3 F (36.8 C)  TempSrc:  Oral  Oral  Resp: 17 16 24 17   Height:      Weight:    80.241 kg (176 lb 14.4 oz)  SpO2: 100% 100% 100% 100%    Intake/Output Summary (Last 24 hours) at 07/23/13 0755 Last data filed at 07/23/13 0645  Gross per 24 hour  Intake 465.17 ml  Output   2000 ml  Net -1534.83 ml  Exam: Gen:  NAD Cardiovascular:  RRR, No M/R/G Respiratory:  Lungs CTAB Gastrointestinal:  Abdomen soft, NT/ND, + BS Extremities:  1-2+ edema  Data Reviewed: Basic Metabolic Panel:  Recent Labs Lab 07/17/13 0535 07/20/13 0530 07/21/13 0140 07/23/13 0615  NA 135 142 137 138  K 4.1 4.3 4.1 4.2  CL 103  110 106 106  CO2 20 25 24 25   GLUCOSE 78 94 97 102*  BUN 11 10 11 14   CREATININE 0.73 0.86 0.97 0.88  CALCIUM 9.2 8.7 8.6 9.0   GFR Estimated Creatinine Clearance: 125 ml/min (by C-G formula based on Cr of 0.88). Liver Function Tests:  Recent Labs Lab 07/17/13 0535 07/20/13 0530 07/21/13 0140 07/21/13 1230 07/23/13 0615  AST 24 30 34 29 20  ALT 9 11 13 13 11   ALKPHOS 86 63 77 76 78  BILITOT 1.0 1.3* 3.2* 3.1* 1.7*  PROT 7.8 6.4 7.1 7.0 6.6  ALBUMIN 4.2 3.4* 3.9 3.7 3.5   CBC:  Recent Labs Lab 07/17/13 0535 07/19/13 0500 07/20/13 0530 07/21/13 0140 07/21/13 1230 07/23/13 0615  WBC 12.0* 8.7 9.9 16.1* 9.1 11.5*  NEUTROABS 7.8*  --   --   --   --   --   HGB 7.7* 5.3* 4.9* 6.2* 5.8* 4.6*  HCT 22.6* 14.7* 13.8* 17.3* 16.1* 13.5*  MCV 94.2 92.5 92.6 92.0 91.5 96.4  PLT 526* 360 337 366 323 341   Anemia work up  Recent Labs  07/21/13 0140  RETICCTPCT 3.0   Microbiology Recent Results (from the past 240 hour(s))  CULTURE, BLOOD (ROUTINE X 2)     Status: None   Collection Time    07/14/13  4:30 PM      Result Value Range Status   Specimen Description BLOOD RIGHT ARM   Final   Special Requests BOTTLES DRAWN AEROBIC AND ANAEROBIC 5CC   Final   Culture  Setup Time     Final   Value: 07/14/2013 22:32     Performed at [facility]   Culture     Final   Value: NO GROWTH 5 DAYS     Performed at [facility]   Report Status 07/20/2013 FINAL   Final  CULTURE, BLOOD (ROUTINE X 2)     Status: None   Collection Time    07/14/13  4:35 PM      Result Value Range Status   Specimen Description BLOOD LEFT ARM   Final   Special Requests BOTTLES DRAWN AEROBIC AND ANAEROBIC 5CC   Final   Culture  Setup Time     Final   Value: 07/14/2013 22:31     Performed at [facility]   Culture     Final   Value: NO GROWTH 5 DAYS     Performed at [facility]   Report Status 07/20/2013 FINAL   Final  CULTURE, BLOOD (ROUTINE X 2)     Status: None    Collection Time    07/21/13  5:00 AM      Result Value Range Status   Specimen Description BLOOD PICC   Final   Special Requests BOTTLES DRAWN AEROBIC AND ANAEROBIC 4.5 EACH   Final   Culture  Setup Time     Final   Value: 07/21/2013 09:58     Performed at [facility]   Culture     Final   Value:        BLOOD CULTURE RECEIVED NO GROWTH TO DATE CULTURE  WILL BE HELD FOR 5 DAYS BEFORE ISSUING A FINAL NEGATIVE REPORT     Performed at [facility]   Report Status PENDING   Incomplete  URINE CULTURE     Status: None   Collection Time    07/21/13  5:02 AM      Result Value Range Status   Specimen Description URINE, RANDOM   Final   Special Requests none Normal   Final   Culture  Setup Time     Final   Value: 07/21/2013 11:06     Performed at [facility] Count     Final   Value: 2,000 COLONIES/ML     Performed at [facility]   Culture     Final   Value: INSIGNIFICANT GROWTH     Performed at [facility]   Report Status 07/22/2013 FINAL   Final  CULTURE, BLOOD (ROUTINE X 2)     Status: None   Collection Time    07/21/13  6:10 AM      Result Value Range Status   Specimen Description BLOOD LEFT HAND   Final   Special Requests BOTTLES DRAWN AEROBIC ONLY 5CC   Final   Culture  Setup Time     Final   Value: 07/21/2013 09:59     Performed at [facility]   Culture     Final   Value:        BLOOD CULTURE RECEIVED NO GROWTH TO DATE CULTURE WILL BE HELD FOR 5 DAYS BEFORE ISSUING A FINAL NEGATIVE REPORT     Performed at [facility]   Report Status PENDING   Incomplete     Procedures and Diagnostic Studies: Dg Chest 2 View  07/14/2013   CLINICAL DATA:  sickle cell disease with pain  EXAM: CHEST  2 VIEW  COMPARISON:  June 03, 2013  FINDINGS: The central catheter is been removed. There is no edema or consolidation. Heart is mildly enlarged with normal pulmonary vascularity. No adenopathy. No pneumothorax.  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 There are multiple endplate the infarcts in the thoracic and lumbar spine consistent with known sickle cell disease.  IMPRESSION: No edema or consolidation. Stable cardiac prominence. Bony changes present consistent with known sickle cell disease.   Electronically Signed   By: Bretta Bang M.D.   On: 07/14/2013 14:05   No results found. Scheduled Meds: . azithromycin  500 mg Intravenous Q24H  . enoxaparin (LOVENOX) injection  40 mg Subcutaneous Q24H  . fluticasone  1 spray Each Nare Daily  . folic acid  1 mg Oral q morning - 10a  . HYDROmorphone PCA 0.3 mg/mL   Intravenous Q4H  . methylPREDNISolone (SOLU-MEDROL) injection  40 mg Intravenous Once  . morphine  60 mg Oral BID  . potassium chloride  20 mEq Oral BID  . senna-docusate  1 tablet Oral BID  . sodium chloride  10-40 mL Intracatheter Q12H  . sodium chloride  10-40 mL Intracatheter Q12H   Continuous Infusions: . sodium chloride 20 mL/hr (07/22/13 2242)    Time spent: 25 minutes.   LOS: 9 days   Kardell Virgil  [facility] Pager [phone].   *Please note that the hospitalists switch teams on Wednesdays. Please call the flow manager at [phone] if you are having difficulty reaching the hospitalist taking care of this patient as she can update you and provide the most up-to-date pager number of provider caring for  the patient. If 8PM-8AM, please contact night-coverage at [email or web address], password [facility]  07/23/2013, 7:55 AM    Information printed out, explained, and given to the patient:  In an effort to keep you and your family informed about your hospital stay, I am providing you with this information sheet. If you or your family have any questions, please do not hesitate to have the nursing staff page me to set up a meeting time.  Adel Thomley 07/23/2013 9 (Number of days in the hospital)  Treatment team:  Dr. Hillery Aldo, Hospitalist (Internist)  Dr. Cephas Darby (hematologist)  Active Treatment Issues with Plan: Principal Problem:  Sickle cell pain crisis / backache  -Pain management: Continue long acting medications at usual home dose: MS Contin 60 mg Q 12 hours. -Continue custom dose PCA dilaudid while in acute crisis. -Wean IV narcotics when pain rated improved. -Monitor bilirubin/LDH to monitor hemolysis. Current bilirubin: 1.7, LDH 359.  -There currently holding your Hydrea given hemoglobin < 6 mg/dL and reticulocyte count < 80K when hemoglobin < 9 mg/dL.   -K pad as needed.  -Continue folic acid.  Active problems:  Low potassium Supplement orally. Constipation induced by narcotics  -Senna-S for pro motility effect.  -Increase mobilization as tolerated.  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 Give Solu-Medrol 40 mg IV as premedication if subsequent transfusion needed. -Current hemoglobin is: 4.6 mg/dL. Give 1 unit today, with premedication (Solumedrol 40 mg).  (Also given one unit of blood on 07/20/2013.) Fever / Possible acute bronchitis -Blood cultures done on admission are negative. Chest x-ray done 07/21/2013 negative for pneumonia, but suspicious for bronchitis.  Azithromycin (antibiotics) started. U/A negative for nitrites and leukocytes.  Anticipated discharge date: Depends on progress.

## 2013-07-24 LAB — BASIC METABOLIC PANEL
Calcium: 9.2 mg/dL (ref 8.4–10.5)
Chloride: 105 mEq/L (ref 96–112)
Creatinine, Ser: 0.89 mg/dL (ref 0.50–1.35)
GFR calc Af Amer: >90 mL/min (ref >90)
GFR calc non Af Amer: >90 mL/min (ref >90)

## 2013-07-24 LAB — CBC
MCHC: 33.6 g/dL (ref 30.0–36.0)
MCV: 100 fL (ref 78.0–100.0)
Platelets: 340 10*3/uL (ref 150–400)
RDW: 17.3 % — ABNORMAL HIGH (ref 11.5–15.5)
WBC: 9.2 10*3/uL (ref 4.0–10.5)

## 2013-07-24 LAB — TYPE AND SCREEN
ABO/RH(D): AB POS
Antibody Screen: POSITIVE
DAT, IgG: NEGATIVE
Unit division: 0

## 2013-07-24 LAB — PARVOVIRUS B19 ANTIBODY, IGG AND IGM
Parovirus B19 IgG Abs: 3.4 index — ABNORMAL HIGH (ref <0.9)
Parovirus B19 IgM Abs: 0.1 index (ref <0.9)

## 2013-07-24 MED ORDER — POLYETHYLENE GLYCOL 3350 17 G PO PACK
17.0000 g | PACK | Freq: Every day | ORAL | Status: DC
  Administered 2013-07-24 – 2013-07-26 (×3): 17 g via ORAL
  Filled 2013-07-24 (×4): qty 1

## 2013-07-24 MED ORDER — AZITHROMYCIN 250 MG PO TABS
250.0000 mg | ORAL_TABLET | Freq: Every day | ORAL | Status: AC
  Administered 2013-07-24 – 2013-07-25 (×2): 250 mg via ORAL
  Filled 2013-07-24 (×2): qty 1

## 2013-07-24 MED ORDER — ACETAMINOPHEN 325 MG PO TABS
650.0000 mg | ORAL_TABLET | ORAL | Status: DC | PRN
  Administered 2013-07-24 – 2013-07-27 (×2): 650 mg via ORAL
  Filled 2013-07-24 (×2): qty 2

## 2013-07-24 MED ORDER — METHYLPREDNISOLONE SODIUM SUCC 40 MG IJ SOLR
40.0000 mg | INTRAMUSCULAR | Status: DC | PRN
Start: 2013-07-24 — End: 2013-07-29
  Administered 2013-07-24 – 2013-07-27 (×2): 40 mg via INTRAVENOUS
  Filled 2013-07-24 (×2): qty 1

## 2013-07-24 MED ORDER — DIPHENHYDRAMINE HCL 50 MG PO CAPS
50.0000 mg | ORAL_CAPSULE | Freq: Every day | ORAL | Status: DC | PRN
  Administered 2013-07-24 – 2013-07-27 (×2): 50 mg via ORAL
  Filled 2013-07-24 (×2): qty 1

## 2013-07-24 NOTE — Progress Notes (Signed)
[facility] PROGRESS NOTE  Dennis Bernard ZOX:[id number] DOB: Apr 07, 1986 DOA: 07/14/2013 PCP: Dr. Craig Staggers, Teodoro Kil  Brief narrative: Dennis Bernard is an 27 y.o. male with a PMH of sickle cell anemia who was admitted 07/14/2013 with a vaso-occlusive crisis.  Assessment/Plan: Principal Problem:  Sickle cell pain crisis / backache  -Pain management: Continue long acting medications at usual home dose: MS Contin 60 mg Q 12 hours. Avoid increasing dose further to prevent further issues with tolerance.  -Continue custom dose PCA dilaudid while in acute crisis. Current PCA settings: 1 Q 10 minutes, 10 mg/4 hour lock out.  -Status post Toradol IV Q 6 hours x 5 days for anti-inflammatory effect.  -Adjuvant pain therapy: None.  -Wean IV narcotics when pain rated 7/10. Pain rated 8/10 today. -When able to tolerate oral therapy, convert at 50-75% of IV dose & give PRNs Q 2-3 hours.  -Monitor CBC (add differential if on Hydrea) Q 2-3 day(s).  -Monitor bilirubin/LDH Q 72 hours to monitor hemolysis. Current bilirubin: 1.7, LDH 359.  -No evidence of aplastic anemia. Reticulocyte count abnormally low for the degree of hemolysis.  Discussed with Dr. Cyndie Chime who recommended repeating level 07/21/13, still low but slightly improved.  Parvovirus titers pending. -Ferritin level last checked 03/23/13, and was 618.  -Hold Hydrea given hemoglobin < 6 mg/dL and reticulocyte count < 80K when hemoglobin < 9 mg/dL.  -KVO IVF.  -K pad PRN.  -Continue folic acid.  Active problems:  Pulmonary venous congestion Noted on chest x-ray done 07/21/2013. No frank pulmonary edema. KVO IV fluids.  Given LE edema, given Lasix 20 mg X 1 07/23/2013. Hypokalemia On oral supplements. Constipation induced by narcotics  -Senna-S for pro motility effect. Add daily MiraLAX. -Increase mobilization as tolerated.  Sickle cell anemia  -Patient's baseline hemoglobin is: 9 mg/dL  -Patient's transfusion hemoglobin  threshold is: <5 mg/dL, history of delayed transfusion reaction with multiple alloantibody formation. Give Solu-Medrol 40 mg IV as premedication if subsequent transfusion needed. -Current hemoglobin is: 4.4 mg/dL. Status post one unit of blood on 07/20/2013, transfusion ordered 07/23/2013 but unable to find compatible blood.  Has an I antigen.  Dr. Cyndie Chime assisting with attempts to obtain compatible blood. Fever / SIRS / Acute bronchitis -Blood cultures done on admission are negative. Consider replacement of Port-A-Cath which was removed during a previous hospitalization secondary to infection. Spiked another fever after blood transfusion. Chest x-ray done 07/21/2013 negative for pneumonia, but suspicious for bronchitis.  Azithromycin started. U/A negative for nitrites and leukocytes.  Code Status: Full.  Family Communication: No family at bedside.  Disposition Plan: Home when stable.  IV access:   PICC line placed 07/20/2013  Medical Consultants:  Dr. Cephas Darby, Hematology  Other Consultants:  None.  Anti-infectives:  Azithromycin 07/21/13--->07/25/13  HPI/Subjective: [name] is having some shortness of breath with activity. Continues to have some nausea and poor appetite. Pain has improved marginally. Some constipation noted.  Objective: Filed Vitals:   07/24/13 0019 07/24/13 0200 07/24/13 0450 07/24/13 0535  BP:  107/61  90/46  Pulse:  89  94  Temp:  97.9 F (36.6 C)  98.2 F (36.8 C)  TempSrc:  Oral  Oral  Resp: 15 16 17 16   Height:      Weight:      SpO2: 100% 100% 100% 100%    Intake/Output Summary (Last 24 hours) at 07/24/13 0724 Last data filed at 07/24/13 0100  Gross per 24 hour  Intake   1120 ml  Output  2900 ml  Net  -1780 ml    Exam: Gen:  NAD Cardiovascular:  RRR, No M/R/G Respiratory:  Lungs CTAB Gastrointestinal:  Abdomen soft, NT/ND, + BS Extremities:  2+ edema  Data Reviewed: Basic Metabolic Panel:  Recent Labs Lab  07/20/13 0530 07/21/13 0140 07/23/13 0615  NA 142 137 138  K 4.3 4.1 4.2  CL 110 106 106  CO2 25 24 25   GLUCOSE 94 97 102*  BUN 10 11 14   CREATININE 0.86 0.97 0.88  CALCIUM 8.7 8.6 9.0   GFR Estimated Creatinine Clearance: 125 ml/min (by C-G formula based on Cr of 0.88). Liver Function Tests:  Recent Labs Lab 07/20/13 0530 07/21/13 0140 07/21/13 1230 07/23/13 0615  AST 30 34 29 20  ALT 11 13 13 11   ALKPHOS 63 77 76 78  BILITOT 1.3* 3.2* 3.1* 1.7*  PROT 6.4 7.1 7.0 6.6  ALBUMIN 3.4* 3.9 3.7 3.5   CBC:  Recent Labs Lab 07/20/13 0530 07/21/13 0140 07/21/13 1230 07/23/13 0615 07/24/13 0645  WBC 9.9 16.1* 9.1 11.5* 9.2  HGB 4.9* 6.2* 5.8* 4.6* 4.4*  HCT 13.8* 17.3* 16.1* 13.5* 13.1*  MCV 92.6 92.0 91.5 96.4 100.0  PLT 337 366 323 341 340   Anemia work up No results found for this basename: VITAMINB12, FOLATE, FERRITIN, TIBC, IRON, RETICCTPCT,  in the last 72 hours Microbiology Recent Results (from the past 240 hour(s))  CULTURE, BLOOD (ROUTINE X 2)     Status: None   Collection Time    07/14/13  4:30 PM      Result Value Range Status   Specimen Description BLOOD RIGHT ARM   Final   Special Requests BOTTLES DRAWN AEROBIC AND ANAEROBIC 5CC   Final   Culture  Setup Time     Final   Value: 07/14/2013 22:32     Performed at [facility]   Culture     Final   Value: NO GROWTH 5 DAYS     Performed at [facility]   Report Status 07/20/2013 FINAL   Final  CULTURE, BLOOD (ROUTINE X 2)     Status: None   Collection Time    07/14/13  4:35 PM      Result Value Range Status   Specimen Description BLOOD LEFT ARM   Final   Special Requests BOTTLES DRAWN AEROBIC AND ANAEROBIC 5CC   Final   Culture  Setup Time     Final   Value: 07/14/2013 22:31     Performed at [facility]   Culture     Final   Value: NO GROWTH 5 DAYS     Performed at [facility]   Report Status 07/20/2013 FINAL   Final  CULTURE, BLOOD (ROUTINE X 2)     Status:  None   Collection Time    07/21/13  5:00 AM      Result Value Range Status   Specimen Description BLOOD PICC   Final   Special Requests BOTTLES DRAWN AEROBIC AND ANAEROBIC 4.5 EACH   Final   Culture  Setup Time     Final   Value: 07/21/2013 09:58     Performed at [facility]   Culture     Final   Value:        BLOOD CULTURE RECEIVED NO GROWTH TO DATE CULTURE WILL BE HELD FOR 5 DAYS BEFORE ISSUING A FINAL NEGATIVE REPORT     Performed at [facility]   Report Status PENDING  Incomplete  URINE CULTURE     Status: None   Collection Time    07/21/13  5:02 AM      Result Value Range Status   Specimen Description URINE, RANDOM   Final   Special Requests none Normal   Final   Culture  Setup Time     Final   Value: 07/21/2013 11:06     Performed at [facility] Count     Final   Value: 2,000 COLONIES/ML     Performed at [facility]   Culture     Final   Value: INSIGNIFICANT GROWTH     Performed at [facility]   Report Status 07/22/2013 FINAL   Final  CULTURE, BLOOD (ROUTINE X 2)     Status: None   Collection Time    07/21/13  6:10 AM      Result Value Range Status   Specimen Description BLOOD LEFT HAND   Final   Special Requests BOTTLES DRAWN AEROBIC ONLY 5CC   Final   Culture  Setup Time     Final   Value: 07/21/2013 09:59     Performed at [facility]   Culture     Final   Value:        BLOOD CULTURE RECEIVED NO GROWTH TO DATE CULTURE WILL BE HELD FOR 5 DAYS BEFORE ISSUING A FINAL NEGATIVE REPORT     Performed at [facility]   Report Status PENDING   Incomplete     Procedures and Diagnostic Studies: Dg Chest 2 View  07/14/2013   CLINICAL DATA:  sickle cell disease with pain  EXAM: CHEST  2 VIEW  COMPARISON:  June 03, 2013  FINDINGS: The central catheter is been removed. There is no edema or consolidation. Heart is mildly enlarged with normal pulmonary vascularity. No adenopathy. No pneumothorax.   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 There are multiple endplate the infarcts in the thoracic and lumbar spine consistent with known sickle cell disease.  IMPRESSION: No edema or consolidation. Stable cardiac prominence. Bony changes present consistent with known sickle cell disease.   Electronically Signed   By: Bretta Bang M.D.   On: 07/14/2013 14:05   No results found. Scheduled Meds: . azithromycin  500 mg Intravenous Q24H  . enoxaparin (LOVENOX) injection  40 mg Subcutaneous Q24H  . fluticasone  1 spray Each Nare Daily  . folic acid  1 mg Oral q morning - 10a  . HYDROmorphone PCA 0.3 mg/mL   Intravenous Q4H  . methylPREDNISolone (SOLU-MEDROL) injection  40 mg Intravenous Once  . morphine  60 mg Oral BID  . potassium chloride  20 mEq Oral BID  . senna-docusate  1 tablet Oral BID  . sodium chloride  10-40 mL Intracatheter Q12H  . sodium chloride  10-40 mL Intracatheter Q12H   Continuous Infusions: . sodium chloride 20 mL/hr at 07/23/13 1500    Time spent: 25 minutes.   LOS: 10 days   Talonda Artist  [facility] Pager [phone].   *Please note that the hospitalists switch teams on Wednesdays. Please call the flow manager at [phone] if you are having difficulty reaching the hospitalist taking care of this patient as she can update you and provide the most up-to-date pager number of provider caring for the patient. If 8PM-8AM, please contact night-coverage at [email or web address], password [facility]  07/24/2013, 7:24 AM    Information printed out, explained, and given to the patient:  In an effort to keep you and your family informed about your hospital stay, I am providing you with this information sheet. If you or your family have any questions, please do not hesitate to have the nursing staff page me to set up a meeting time.  Malichi Malinak 07/24/2013 10 (Number of days in the hospital)  Treatment team:  Dr. Hillery Aldo, Hospitalist  (Internist)  Dr. Cephas Darby (hematologist)  Active Treatment Issues with Plan: Principal Problem:  Sickle cell pain crisis / backache  -Pain management: Continue long acting medications at usual home dose: MS Contin 60 mg Q 12 hours. -Continue custom dose PCA dilaudid while in acute crisis. -Wean IV narcotics when pain rated improved. -Monitor bilirubin/LDH to monitor hemolysis. Current bilirubin: 1.7, LDH 359.  -We are currently holding your Hydrea because of your low hemoglobin.   -Continue heating pad as needed.  -Continue folic acid.  Active problems:  Low potassium Supplement orally. Constipation induced by narcotics  -Senna-S for pro motility effect.  -Try walking the halls if you are able.  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 Give Solu-Medrol 40 mg IV, Tylenol and Benadryl as premedication if subsequent transfusion needed. -Current hemoglobin is: 4.4 mg/dL. we will give you a unit of blood when we can find a compatible unit to give. Fever / Possible acute bronchitis -Blood cultures done on admission are negative. Chest x-ray done 07/21/2013 negative for pneumonia, but suspicious for bronchitis.  Azithromycin (antibiotics) started which she will finish up on 07/25/2013. We will convert to to oral therapy today.  Anticipated discharge date: Depends on progress.

## 2013-07-24 NOTE — Progress Notes (Signed)
Peak hemoglobin after a 1 unit transfusion Thursday night November 6,  6.2 now drifting down with  value of 4.6 on November 9. Situation discussed with blood bank. They will try to obtain another crossmatch unit. We will continue to try to minimize transfusion therapy. Both [facility] and [facility] have researched I antigen positivity in sickle cell patients and have not come up with anything. At this point I question the previous data from [place]. We will continue to give him most compatible units. I discussed this with the patient as well as the hospitalist attending. We will add routine premedication with Solu-Medrol, Benadryl, and Tylenol prior to all subsequent units transfused. If any evidence for a delayed hemolytic transfusion reaction, I will consider a brief course of steroids.

## 2013-07-25 LAB — BASIC METABOLIC PANEL
CO2: 24 mEq/L (ref 19–32)
Chloride: 106 mEq/L (ref 96–112)
GFR calc Af Amer: >90 mL/min (ref >90)
GFR calc non Af Amer: >90 mL/min (ref >90)
Potassium: 4.8 mEq/L (ref 3.5–5.1)
Sodium: 137 mEq/L (ref 135–145)

## 2013-07-25 LAB — TYPE AND SCREEN
ABO/RH(D): AB POS
Antibody Screen: POSITIVE
DAT, IgG: POSITIVE

## 2013-07-25 LAB — CBC
HCT: 16.3 % — ABNORMAL LOW (ref 39.0–52.0)
MCHC: 33.1 g/dL (ref 30.0–36.0)
Platelets: 378 10*3/uL (ref 150–400)
RBC: 1.66 MIL/uL — ABNORMAL LOW (ref 4.22–5.81)
RDW: 18.6 % — ABNORMAL HIGH (ref 11.5–15.5)
WBC: 20.1 10*3/uL — ABNORMAL HIGH (ref 4.0–10.5)

## 2013-07-25 NOTE — Progress Notes (Signed)
[facility] PROGRESS NOTE  Donyell Ding AVW:[id number] DOB: [birth year] DOA: 07/14/2013 PCP: Dr. Craig Staggers, Teodoro Kil  Brief narrative: Dennis Bernard is an 27 y.o. male with a PMH of sickle cell anemia who was admitted 07/14/2013 with a vaso-occlusive crisis.  Assessment/Plan: Principal Problem:  Sickle cell pain crisis / backache  -Pain management: Continue long acting medications at usual home dose: MS Contin 60 mg Q 12 hours. Avoid increasing dose further to prevent further issues with tolerance.  -Continue custom dose PCA dilaudid while in acute crisis. Current PCA settings: 1 Q 10 minutes, 10 mg/4 hour lock out. Nurses report he is maxing out the dose every 4 hours. -Status post Toradol IV Q 6 hours x 5 days for anti-inflammatory effect.  -Adjuvant pain therapy: None.  -Wean IV narcotics when pain rated 7/10. Pain rated 8/10 today. -When able to tolerate oral therapy, convert at 50-75% of IV dose & give PRNs Q 2-3 hours.  -Monitor CBC Q 2-3 day(s).  -Monitor bilirubin/LDH Q 72 hours to monitor hemolysis. Current bilirubin: 1.7, LDH 359.  -No evidence of aplastic anemia. Reticulocyte count abnormally low for the degree of hemolysis.  Discussed with Dr. Cyndie Chime who recommended repeating level 07/21/13, still low but slightly improved.  Parvovirus IgM 0.1 (WNL). -Ferritin level last checked 03/23/13, and was 618.  -Hold Hydrea given hemoglobin < 6 mg/dL and reticulocyte count < 80K when hemoglobin < 9 mg/dL.  -K pad PRN.  -Continue folic acid.  Active problems:  Pulmonary venous congestion Noted on chest x-ray done 07/21/2013. No frank pulmonary edema. KVO IV fluids.  Given LE edema, given Lasix 20 mg X 1 07/23/2013. Hypokalemia On oral supplements. Constipation induced by narcotics  -Senna-S for pro motility effect. Add daily MiraLAX. -Increase mobilization as tolerated.  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 Give Solu-Medrol 40 mg IV as premedication if subsequent transfusion needed. -Current hemoglobin is: 5.4 mg/dL. Status post one unit of blood on 07/20/2013, and additional unit 07/24/13.  Has an I antigen.  Dr. Cyndie Chime assisting with care. Fever / SIRS / Acute bronchitis -Blood cultures done on admission are negative. Consider replacement of Port-A-Cath which was removed during a previous hospitalization secondary to infection. Spiked another fever after blood transfusion. Chest x-ray done 07/21/2013 negative for pneumonia, but suspicious for bronchitis.  Azithromycin started. U/A negative for nitrites and leukocytes.  Code Status: Full.  Family Communication: No family at bedside.  Disposition Plan: Home when stable.  IV access:   PICC line placed 07/20/2013  Medical Consultants:  Dr. Cephas Darby, Hematology  Other Consultants:  None.  Anti-infectives:  Azithromycin 07/21/13--->07/25/13  HPI/Subjective: Dennis Bernard is feeling a little bit better. He still has some shortness of breath with activity. No complaints of nausea today. Still constipated. Pain is "a little better ".  Objective: Filed Vitals:   07/25/13 0636 07/25/13 0729 07/25/13 1000 07/25/13 1145  BP: 109/56  105/63   Pulse: 89  84   Temp: 98.1 F (36.7 C)  99.2 F (37.3 C)   TempSrc: Oral  Oral   Resp: 16 16 16 21   Height:      Weight:      SpO2: 99% 100% 100% 100%    Intake/Output Summary (Last 24 hours) at 07/25/13 1408 Last data filed at 07/25/13 1350  Gross per 24 hour  Intake    460 ml  Output  3600 ml  Net  -3140 ml    Exam: Gen:  NAD Cardiovascular:  RRR, III/VI SEM Respiratory:  Lungs CTAB Gastrointestinal:  Abdomen soft, NT/ND, + BS Extremities:  2+ edema  Data Reviewed: Basic Metabolic Panel:  Recent Labs Lab 07/20/13 0530 07/21/13 0140 07/23/13 0615  07/24/13 0645 07/25/13 0528  NA 142 137 138 137 137  K 4.3 4.1 4.2 4.0 4.8  CL 110 106 106 105 106  CO2 25 24 25 25 24   GLUCOSE 94 97 102* 90 115*  BUN 10 11 14 12 16   CREATININE 0.86 0.97 0.88 0.89 0.86  CALCIUM 8.7 8.6 9.0 9.2 9.2   GFR Estimated Creatinine Clearance: 127.9 ml/min (by C-G formula based on Cr of 0.86). Liver Function Tests:  Recent Labs Lab 07/20/13 0530 07/21/13 0140 07/21/13 1230 07/23/13 0615  AST 30 34 29 20  ALT 11 13 13 11   ALKPHOS 63 77 76 78  BILITOT 1.3* 3.2* 3.1* 1.7*  PROT 6.4 7.1 7.0 6.6  ALBUMIN 3.4* 3.9 3.7 3.5   CBC:  Recent Labs Lab 07/21/13 0140 07/21/13 1230 07/23/13 0615 07/24/13 0645 07/25/13 0528  WBC 16.1* 9.1 11.5* 9.2 20.1*  HGB 6.2* 5.8* 4.6* 4.4* 5.4*  HCT 17.3* 16.1* 13.5* 13.1* 16.3*  MCV 92.0 91.5 96.4 100.0 98.2  PLT 366 323 341 340 378   Anemia work up No results found for this basename: VITAMINB12, FOLATE, FERRITIN, TIBC, IRON, RETICCTPCT,  in the last 72 hours Microbiology Recent Results (from the past 240 hour(s))  CULTURE, BLOOD (ROUTINE X 2)     Status: None   Collection Time    07/21/13  5:00 AM      Result Value Range Status   Specimen Description BLOOD PICC   Final   Special Requests BOTTLES DRAWN AEROBIC AND ANAEROBIC 4.5 EACH   Final   Culture  Setup Time     Final   Value: 07/21/2013 09:58     Performed at [facility]   Culture     Final   Value:        BLOOD CULTURE RECEIVED NO GROWTH TO DATE CULTURE WILL BE HELD FOR 5 DAYS BEFORE ISSUING A FINAL NEGATIVE REPORT     Performed at [facility]   Report Status PENDING   Incomplete  URINE CULTURE     Status: None   Collection Time    07/21/13  5:02 AM      Result Value Range Status   Specimen Description URINE, RANDOM   Final   Special Requests none Normal   Final   Culture  Setup Time     Final   Value: 07/21/2013 11:06     Performed at [facility] Count     Final   Value: 2,000 COLONIES/ML     Performed  at [facility]   Culture     Final   Value: INSIGNIFICANT GROWTH     Performed at [facility]   Report Status 07/22/2013 FINAL   Final  CULTURE, BLOOD (ROUTINE X 2)     Status: None   Collection Time    07/21/13  6:10 AM      Result Value Range Status   Specimen Description BLOOD LEFT HAND   Final   Special Requests BOTTLES DRAWN AEROBIC ONLY 5CC   Final   Culture  Setup Time     Final   Value: 07/21/2013 09:59     Performed at [facility]   Culture     Final   Value:        BLOOD CULTURE RECEIVED NO GROWTH TO DATE CULTURE WILL BE HELD FOR 5 DAYS BEFORE ISSUING A FINAL NEGATIVE REPORT     Performed at [facility]   Report Status PENDING   Incomplete     Procedures and Diagnostic Studies: Dg Chest 2 View  07/14/2013   CLINICAL DATA:  sickle cell disease with pain  EXAM: CHEST  2 VIEW  COMPARISON:  June 03, 2013  FINDINGS: The central catheter is been removed. There is no edema or consolidation. Heart is mildly enlarged with normal pulmonary vascularity. No adenopathy. No pneumothorax.  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 There are multiple endplate the infarcts in the thoracic and lumbar spine consistent with known sickle cell disease.  IMPRESSION: No edema or consolidation. Stable cardiac prominence. Bony changes present consistent with known sickle cell disease.   Electronically Signed   By: Bretta Bang M.D.   On: 07/14/2013 14:05   No results found. Scheduled Meds: . enoxaparin (LOVENOX) injection  40 mg Subcutaneous Q24H  . fluticasone  1 spray Each Nare Daily  . folic acid  1 mg Oral q morning - 10a  . HYDROmorphone PCA 0.3 mg/mL   Intravenous Q4H  . morphine  60 mg Oral BID  . polyethylene glycol  17 g Oral Daily  . potassium chloride  20 mEq Oral BID  . senna-docusate  1 tablet Oral BID  . sodium chloride  10-40 mL Intracatheter Q12H  . sodium chloride  10-40 mL Intracatheter Q12H   Continuous Infusions: .  sodium chloride 20 mL/hr at 07/24/13 2230    Time spent: 25 minutes.   LOS: 11 days   Hesper Venturella  [facility] Pager [phone].   *Please note that the hospitalists switch teams on Wednesdays. Please call the flow manager at [phone] if you are having difficulty reaching the hospitalist taking care of this patient as she can update you and provide the most up-to-date pager number of provider caring for the patient. If 8PM-8AM, please contact night-coverage at [email or web address], password [email or web address]  07/25/2013, 2:08 PM    Information printed out, explained, and given to the patient:  In an effort to keep you and your family informed about your hospital stay, I am providing you with this information sheet. If you or your family have any questions, please do not hesitate to have the nursing staff page me to set up a meeting time.  Taras Berquist 07/25/2013 11 (Number of days in the hospital)  Treatment team:  Dr. Hillery Aldo, Hospitalist (Internist)  Dr. Cephas Darby (hematologist)  Active Treatment Issues with Plan: Principal Problem:  Sickle cell pain crisis / backache  -Pain management: Continue long acting medications at usual home dose: MS Contin 60 mg Q 12 hours. -Continue custom dose PCA dilaudid while in acute crisis. -Wean IV narcotics when pain rated improved. -Monitor bilirubin/LDH to monitor hemolysis. Current bilirubin: 1.7, LDH 359.  -We are currently holding your Hydrea because of your low hemoglobin.   -Continue heating pad as needed.  -Continue folic acid.  Active problems:  Low potassium Supplement orally. Constipation induced by narcotics  -Senna-S for pro motility effect.  -Try walking the halls if you are able.  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 Give Solu-Medrol 40  mg IV, Tylenol and  Benadryl as premedication if subsequent transfusion needed. -Current hemoglobin is: 5.4 mg/dL. We will check your hemoglobin again 07/27/13. Fever / Possible acute bronchitis -You will finish up your antibiotics today.  Anticipated discharge date: Depends on progress.

## 2013-07-25 NOTE — Progress Notes (Addendum)
Only a 1 g rise in hemoglobin following one unit of least incompatible packed red cells given on November 10. No immediate reaction with the use of premedications. Clinically stable with good oxygenation at rest. Recommendation at this point is continue close observation alone with attempt to avoid transfusions if at all possible. Monitor for delayed transfusion reaction (bilirubin, LDH, reticulocyte count, and Coombs' test) Situation remains a challenging. No good solutions going into the future. I discussed bone marrow transplant with physicians at [facility]. They remind me that transplant at the current time and requires high dose chemotherapy which in turn would require intensive transfusion support until the transplanted marrow takes.

## 2013-07-26 DIAGNOSIS — F1920 Other psychoactive substance dependence, uncomplicated: Secondary | ICD-10-CM

## 2013-07-26 DIAGNOSIS — Z5189 Encounter for other specified aftercare: Secondary | ICD-10-CM

## 2013-07-26 DIAGNOSIS — G8929 Other chronic pain: Secondary | ICD-10-CM

## 2013-07-26 NOTE — Progress Notes (Signed)
[facility] PROGRESS NOTE  Manav Pierotti ZOX:[id number] DOB: November 02, 1985 DOA: 07/14/2013 PCP: No primary provider on file.  Brief narrative: 27 year-old male with a PMH of sickle cell anemia who was admitted 07/14/2013 with a vaso-occlusive crisis.   Assessment/Plan:   Principal Problem:  Sickle cell pain crisis / backache  - Pain management: Continue long acting medications at usual home dose: MS Contin 60 mg Q 12 hours. Avoid increasing dose further to prevent further issues with tolerance.  - Continue custom dose PCA dilaudid while in acute crisis.  - Status post Toradol IV Q 6 hours x 5 days for anti-inflammatory effect.  - Adjuvant pain therapy: None.  - Wean IV narcotics when pain rated 7/10. Pain rated 8-9/10 today.  - When able to tolerate oral therapy, convert at 50-75% of IV dose & give PRNs Q 2-3 hours.  - Monitor CBC Q 2-3 day(s). Last CBC 11/11 - Monitor bilirubin/LDH Q 72 hours to monitor hemolysis. Current bilirubin: 1.7, LDH 359.  - No evidence of aplastic anemia. Reticulocyte count abnormally low for the degree of hemolysis. Parvovirus IgM 0.1 (WNL).  - Ferritin level last checked 03/23/13 and was 618.  - Hold Hydrea given hemoglobin < 6 mg/dL and reticulocyte count < 80K when hemoglobin < 9 mg/dL.  - K pad PRN.  - Continue folic acid.  Active problems:  Pulmonary venous congestion  - Noted on chest x-ray done 07/21/2013. No frank pulmonary edema.  - Given Lasix 20 mg X 1 07/23/2013.  Hypokalemia  - supplemented  Constipation induced by narcotics  - Senna-S for pro motility effect. Add daily MiraLAX.  - Increase mobilization as tolerated.  Sickle cell anemia  - Patient's baseline hemoglobin is: 9 mg/dL  - Patient's transfusion hemoglobin threshold is: <5 mg/dL, history of delayed transfusion reaction with multiple alloantibody formation. Give Solu-Medrol 40 mg IV as premedication if subsequent transfusion needed.  - Current hemoglobin is: 5.4 mg/dL. Status  post one unit of blood on 07/20/2013, and additional unit 07/24/13. Has an I antigen.  - appreciate Dr. Cyndie Chime assisting with care.  Fever / SIRS / Acute bronchitis  - Blood cultures negative to date. - Chest x-ray done 07/21/2013 negative for pneumonia, but suspicious for bronchitis. Azithromycin started.  - U/A negative for nitrites and leukocytes.    Code Status: Full.  Family Communication: No family at bedside.  Disposition Plan: Home when stable.   IV access:  PICC line placed 07/20/2013 Medical Consultants:  Dr. Cephas Darby, Hematology Other Consultants:  None. Anti-infectives:  Azithromycin 07/21/13--->07/25/13   [name], MD  [facility] Pager [phone]  If 7PM-7AM, please contact night-coverage [email or web address] Password [email or web address] 07/26/2013, 2:49 PM   LOS: 12 days    HPI/Subjective: Still has complaints of pain.  Objective: Filed Vitals:   07/26/13 0749 07/26/13 0823 07/26/13 1000 07/26/13 1428  BP:   102/59   Pulse:   94   Temp:   98.6 F (37 C)   TempSrc:   Oral   Resp: 16 14 15 19   Height:      Weight:      SpO2: 100% 100% 100% 98%    Intake/Output Summary (Last 24 hours) at 07/26/13 1449 Last data filed at 07/26/13 1429  Gross per 24 hour  Intake 1129.1 ml  Output   2000 ml  Net -870.9 ml    Exam:   General:  Pt is alert, follows commands appropriately, not in acute distress  Cardiovascular: Regular rate and rhythm, S1/S2 appreciated  Respiratory: Clear to auscultation bilaterally, no wheezing, no crackles, no rhonchi  Abdomen: Soft, non tender, non distended, bowel sounds present, no guarding  Extremities: +2 LE edema, pulses DP and PT palpable bilaterally  Neuro: Grossly nonfocal  Data Reviewed: Basic Metabolic Panel:  Recent Labs Lab 07/20/13 0530 07/21/13 0140 07/23/13 0615 07/24/13 0645 07/25/13 0528  NA 142 137 138 137 137  K 4.3 4.1 4.2 4.0 4.8  CL 110 106 106 105 106  CO2 25 24 25 25 24   GLUCOSE 94  97 102* 90 115*  BUN 10 11 14 12 16   CREATININE 0.86 0.97 0.88 0.89 0.86  CALCIUM 8.7 8.6 9.0 9.2 9.2   Liver Function Tests:  Recent Labs Lab 07/20/13 0530 07/21/13 0140 07/21/13 1230 07/23/13 0615  AST 30 34 29 20  ALT 11 13 13 11   ALKPHOS 63 77 76 78  BILITOT 1.3* 3.2* 3.1* 1.7*  PROT 6.4 7.1 7.0 6.6  ALBUMIN 3.4* 3.9 3.7 3.5   No results found for this basename: LIPASE, AMYLASE,  in the last 168 hours No results found for this basename: AMMONIA,  in the last 168 hours CBC:  Recent Labs Lab 07/21/13 0140 07/21/13 1230 07/23/13 0615 07/24/13 0645 07/25/13 0528  WBC 16.1* 9.1 11.5* 9.2 20.1*  HGB 6.2* 5.8* 4.6* 4.4* 5.4*  HCT 17.3* 16.1* 13.5* 13.1* 16.3*  MCV 92.0 91.5 96.4 100.0 98.2  PLT 366 323 341 340 378   Cardiac Enzymes: No results found for this basename: CKTOTAL, CKMB, CKMBINDEX, TROPONINI,  in the last 168 hours BNP: No components found with this basename: POCBNP,  CBG: No results found for this basename: GLUCAP,  in the last 168 hours  Recent Results (from the past 240 hour(s))  CULTURE, BLOOD (ROUTINE X 2)     Status: None   Collection Time    07/21/13  5:00 AM      Result Value Range Status   Specimen Description BLOOD PICC   Final   Special Requests BOTTLES DRAWN AEROBIC AND ANAEROBIC 4.5 EACH   Final   Culture  Setup Time     Final   Value: 07/21/2013 09:58     Performed at [facility]   Culture     Final   Value:        BLOOD CULTURE RECEIVED NO GROWTH TO DATE CULTURE WILL BE HELD FOR 5 DAYS BEFORE ISSUING A FINAL NEGATIVE REPORT     Performed at [facility]   Report Status PENDING   Incomplete  URINE CULTURE     Status: None   Collection Time    07/21/13  5:02 AM      Result Value Range Status   Specimen Description URINE, RANDOM   Final   Special Requests none Normal   Final   Culture  Setup Time     Final   Value: 07/21/2013 11:06     Performed at [facility] Count     Final   Value: 2,000  COLONIES/ML     Performed at [facility]   Culture     Final   Value: INSIGNIFICANT GROWTH     Performed at [facility]   Report Status 07/22/2013 FINAL   Final  CULTURE, BLOOD (ROUTINE X 2)     Status: None   Collection Time    07/21/13  6:10 AM      Result Value Range Status   Specimen Description BLOOD LEFT HAND  Final   Special Requests BOTTLES DRAWN AEROBIC ONLY 5CC   Final   Culture  Setup Time     Final   Value: 07/21/2013 09:59     Performed at [facility]   Culture     Final   Value:        BLOOD CULTURE RECEIVED NO GROWTH TO DATE CULTURE WILL BE HELD FOR 5 DAYS BEFORE ISSUING A FINAL NEGATIVE REPORT     Performed at [facility]   Report Status PENDING   Incomplete     Studies: No results found.  Scheduled Meds: . enoxaparin (LOVENOX) injection  40 mg Subcutaneous Q24H  . fluticasone  1 spray Each Nare Daily  . folic acid  1 mg Oral q morning - 10a  . HYDROmorphone PCA    Intravenous Q4H  . morphine  60 mg Oral BID  . polyethylene glycol  17 g Oral Daily  . potassium chloride  20 mEq Oral BID  . senna-docusate  1 tablet Oral BID

## 2013-07-27 DIAGNOSIS — D571 Sickle-cell disease without crisis: Secondary | ICD-10-CM

## 2013-07-27 DIAGNOSIS — J209 Acute bronchitis, unspecified: Secondary | ICD-10-CM

## 2013-07-27 LAB — LACTATE DEHYDROGENASE: LDH: 405 U/L — ABNORMAL HIGH (ref 94–250)

## 2013-07-27 LAB — COMPREHENSIVE METABOLIC PANEL
AST: 22 U/L (ref 0–37)
Albumin: 3.5 g/dL (ref 3.5–5.2)
Alkaline Phosphatase: 90 U/L (ref 39–117)
Calcium: 9 mg/dL (ref 8.4–10.5)
Chloride: 104 mEq/L (ref 96–112)
Glucose, Bld: 132 mg/dL — ABNORMAL HIGH (ref 70–99)
Potassium: 4.6 mEq/L (ref 3.5–5.1)
Sodium: 137 mEq/L (ref 135–145)
Total Bilirubin: 1.6 mg/dL — ABNORMAL HIGH (ref 0.3–1.2)
Total Protein: 6.8 g/dL (ref 6.0–8.3)

## 2013-07-27 LAB — CULTURE, BLOOD (ROUTINE X 2): Culture: NO GROWTH

## 2013-07-27 LAB — CBC
HCT: 13.8 % — ABNORMAL LOW (ref 39.0–52.0)
MCH: 33.8 pg (ref 26.0–34.0)
MCV: 103.8 fL — ABNORMAL HIGH (ref 78.0–100.0)
Platelets: 328 10*3/uL (ref 150–400)
RBC: 1.33 MIL/uL — ABNORMAL LOW (ref 4.22–5.81)
RDW: 17.3 % — ABNORMAL HIGH (ref 11.5–15.5)

## 2013-07-27 MED ORDER — POLYETHYLENE GLYCOL 3350 17 G PO PACK
17.0000 g | PACK | Freq: Two times a day (BID) | ORAL | Status: DC
  Administered 2013-07-27 – 2013-07-28 (×4): 17 g via ORAL
  Filled 2013-07-27 (×5): qty 1

## 2013-07-27 NOTE — Progress Notes (Signed)
RN paged this NP secondary to am Hgb being 4.5. Patient has a complicated hx with blood transfusions, including finding compatible blood and having delayed TF reactions. Hemoc is following, so at this time, will not order TF. Sign out left for attending as to above.  [name], NP [facility]

## 2013-07-27 NOTE — Progress Notes (Signed)
Hemoglobin 4.5 Bilirubin, retic count, LDH stable. No sign of delayed hemolytic transfusion reaction Discussed with patient - since he did well with single units - I would like to give 2 units least incompatible blood today to see if we can get Hb over 6 and get him home. I discussed with blood bank - they will search for units. I put in orders & will sign for release.

## 2013-07-27 NOTE — Progress Notes (Signed)
On call practitioner with [facility] notified of patient morning hemoglobin result of 4.5.

## 2013-07-27 NOTE — Progress Notes (Addendum)
[facility] PROGRESS NOTE  Dennis Bernard AVW:[id number] DOB: Sep 21, 1985 DOA: 07/14/2013 PCP: No primary provider on file.  Brief narrative: 27 year-old male with a PMH of sickle cell anemia who was admitted 07/14/2013 with a vaso-occlusive crisis.   Assessment/Plan:   Principal Problem:  Sickle cell pain crisis / backache  - Pain management: Continue long acting medications at usual home dose: MS Contin 60 mg Q 12 hours. Avoid increasing dose further to prevent further issues with tolerance.  - Continue custom dose PCA dilaudid while in acute crisis.  - Status post Toradol IV Q 6 hours x 5 days for anti-inflammatory effect.  - Adjuvant pain therapy: None.  - Wean IV narcotics when pain rated 7/10. Pain rated 8/10 today.  - When able to tolerate oral therapy, convert at 50-75% of IV dose & give PRNs Q 2-3 hours.  - Monitor CBC Q 2-3 day(s). Last CBC 11/13 with Hemoglobin of 4.5 so per hematology pt will receive 2 U PRBC today - Monitor bilirubin/LDH Q 72 hours to monitor hemolysis. Current bilirubin: 1.7 --> 1.6, LDH 359 --> 405 - No evidence of aplastic anemia. Reticulocyte count abnormally low for the degree of hemolysis. Parvovirus IgM 0.1 (WNL).  - Ferritin level last checked 03/23/13 and was 618.  - Hold Hydrea given hemoglobin < 6 mg/dL and reticulocyte count < 80K when hemoglobin < 9 mg/dL.  - K pad PRN.  - Continue folic acid.  Active problems:  Pulmonary venous congestion  - Noted on chest x-ray done 07/21/2013. No frank pulmonary edema.  - Given Lasix 20 mg X 1 07/23/2013.  Hypokalemia  - supplemented  Constipation induced by narcotics  - Senna-S for pro motility effect. Add twice daily MiraLAX.  - Increase mobilization as tolerated.  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  -  Status post one unit of blood on 07/20/2013, and additional unit 07/24/13. Has an I antigen.  - Hbg 4.5 this am so will transfuse 2 U PRBC with orders for premedication.  - appreciate Dr. Cyndie Chime assisting with care.  Fever / SIRS / Acute bronchitis  - Blood cultures negative to date.  - Chest x-ray done 07/21/2013 negative for pneumonia, but suspicious for bronchitis. Azithromycin started.  - U/A negative for nitrites and leukocytes.   Code Status: Full.  Family Communication: No family at bedside.  Disposition Plan: Home when stable.   IV access:  PICC line placed 07/20/2013 Medical Consultants:  Dr. Cephas Darby, Hematology Other Consultants:  None. Anti-infectives:  Azithromycin 07/21/13--->07/25/13   [name], MD  [facility] Pager [phone]  If 7PM-7AM, please contact night-coverage [email or web address] Password [facility] 07/27/2013, 6:57 AM   LOS: 13 days    HPI/Subjective: Pain is still 8/10, some nausea no vomiting.  Objective: Filed Vitals:   07/26/13 2037 07/26/13 2344 07/27/13 0319 07/27/13 0641  BP:   93/49 98/51  Pulse:   111 91  Temp:   98.7 F (37.1 C) 98.7 F (37.1 C)  TempSrc:   Oral Oral  Resp: 16 17 17 17   Height:      Weight:      SpO2: 97% 98% 97% 100%    Intake/Output Summary (Last 24 hours) at 07/27/13 0657 Last data filed at 07/27/13 0642  Gross per 24 hour  Intake  569.1 ml  Output   3250 ml  Net -2680.9 ml    Exam:   General:  Pt is alert, follows commands appropriately, not in acute distress  Cardiovascular: Regular rate and rhythm, S1/S2, no murmurs, no rubs, no gallops  Respiratory: Clear to auscultation bilaterally, no wheezing, no crackles, no rhonchi  Abdomen: Soft, non tender, non distended, bowel sounds present, no guarding  Extremities: No edema, pulses DP and PT palpable bilaterally  Neuro: Grossly nonfocal  Data Reviewed: Basic Metabolic Panel:  Recent Labs Lab 07/21/13 0140 07/23/13 0615  07/24/13 0645 07/25/13 0528 07/27/13 0400  NA 137 138 137 137 137  K 4.1 4.2 4.0 4.8 4.6  CL 106 106 105 106 104  CO2 24 25 25 24 23   GLUCOSE 97 102* 90 115* 132*  BUN 11 14 12 16 12   CREATININE 0.97 0.88 0.89 0.86 0.89  CALCIUM 8.6 9.0 9.2 9.2 9.0   Liver Function Tests:  Recent Labs Lab 07/21/13 0140 07/21/13 1230 07/23/13 0615 07/27/13 0400  AST 34 29 20 22   ALT 13 13 11 14   ALKPHOS 77 76 78 90  BILITOT 3.2* 3.1* 1.7* 1.6*  PROT 7.1 7.0 6.6 6.8  ALBUMIN 3.9 3.7 3.5 3.5   No results found for this basename: LIPASE, AMYLASE,  in the last 168 hours No results found for this basename: AMMONIA,  in the last 168 hours CBC:  Recent Labs Lab 07/21/13 1230 07/23/13 0615 07/24/13 0645 07/25/13 0528 07/27/13 0400  WBC 9.1 11.5* 9.2 20.1* 19.0*  HGB 5.8* 4.6* 4.4* 5.4* 4.5*  HCT 16.1* 13.5* 13.1* 16.3* 13.8*  MCV 91.5 96.4 100.0 98.2 103.8*  PLT 323 341 340 378 328   Cardiac Enzymes: No results found for this basename: CKTOTAL, CKMB, CKMBINDEX, TROPONINI,  in the last 168 hours BNP: No components found with this basename: POCBNP,  CBG: No results found for this basename: GLUCAP,  in the last 168 hours  Recent Results (from the past 240 hour(s))  CULTURE, BLOOD (ROUTINE X 2)     Status: None   Collection Time    07/21/13  5:00 AM      Result Value Range Status   Specimen Description BLOOD PICC   Final   Special Requests BOTTLES DRAWN AEROBIC AND ANAEROBIC 4.5 EACH   Final   Culture  Setup Time     Final   Value: 07/21/2013 09:58     Performed at [facility]   Culture     Final   Value:        BLOOD CULTURE RECEIVED NO GROWTH TO DATE CULTURE WILL BE HELD FOR 5 DAYS BEFORE ISSUING A FINAL NEGATIVE REPORT     Performed at [facility]   Report Status PENDING   Incomplete  URINE CULTURE     Status: None   Collection Time    07/21/13  5:02 AM      Result Value Range Status   Specimen Description URINE, RANDOM   Final   Special Requests none  Normal   Final   Culture  Setup Time     Final   Value: 07/21/2013 11:06     Performed at [facility] Count     Final   Value: 2,000 COLONIES/ML     Performed at [facility]   Culture     Final   Value: INSIGNIFICANT GROWTH     Performed at [facility]   Report Status 07/22/2013 FINAL   Final  CULTURE, BLOOD (ROUTINE X 2)  Status: None   Collection Time    07/21/13  6:10 AM      Result Value Range Status   Specimen Description BLOOD LEFT HAND   Final   Special Requests BOTTLES DRAWN AEROBIC ONLY 5CC   Final   Culture  Setup Time     Final   Value: 07/21/2013 09:59     Performed at [facility]   Culture     Final   Value:        BLOOD CULTURE RECEIVED NO GROWTH TO DATE CULTURE WILL BE HELD FOR 5 DAYS BEFORE ISSUING A FINAL NEGATIVE REPORT     Performed at [facility]   Report Status PENDING   Incomplete     Studies: No results found.  Scheduled Meds: . enoxaparin (LOVENOX) injection  40 mg Subcutaneous Q24H  . fluticasone  1 spray Each Nare Daily  . folic acid  1 mg Oral q morning - 10a  . HYDROmorphone PCA 0.3 mg/mL   Intravenous Q4H  . morphine  60 mg Oral BID  . polyethylene glycol  17 g Oral Daily  . potassium chloride  20 mEq Oral BID  . senna-docusate  1 tablet Oral BID  . sodium chloride  10-40 mL Intracatheter Q12H  . sodium chloride  10-40 mL Intracatheter Q12H   Continuous Infusions: . sodium chloride 500 mL (07/26/13 1027)

## 2013-07-27 NOTE — Progress Notes (Signed)
Patient received pre-medications before blood transfusion.  Patient was given 650 mg Tylenol po, 50 mg Benadryl po, and Solumedrol 40 mg IV.  Patient tolerated first unit of blood with no problems.  Patient currently has second unit of blood transfusing.  Will monitor.  Allayne Butcher [facility]  07/27/2013  7:45 PM

## 2013-07-28 DIAGNOSIS — R5381 Other malaise: Secondary | ICD-10-CM

## 2013-07-28 LAB — TYPE AND SCREEN
DAT, IgG: POSITIVE
Unit division: 0
Unit division: 0

## 2013-07-28 NOTE — Progress Notes (Signed)
He was transfused with 2 units of least incompatible red blood cells yesterday and had no reaction. We are now getting lab every other day so no labs from this morning. Crisis pain is slowly subsiding. Hopefully he will hold onto the blood and be able to be discharged tomorrow. I gave him my card and we will set up a followup appointment after discharge. 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

## 2013-07-28 NOTE — Progress Notes (Addendum)
[facility] PROGRESS NOTE  Dennis Bernard ZOX:[id number] DOB: 1986-07-21 DOA: 07/14/2013 PCP: No primary provider on file.  Brief narrative: 27 year-old male with a PMH of sickle cell anemia who was admitted 07/14/2013 with a vaso-occlusive crisis.   Assessment/Plan:   Principal Problem:  Sickle cell pain crisis / backache  - Pain management: Continue long acting medications at usual home dose: MS Contin 60 mg Q 12 hours. Avoid increasing dose further to prevent further issues with tolerance.  - Continue custom dose PCA dilaudid while in acute crisis. I spoke with patient extensively in regards to PCA and need to taper it down. We will transition to IV/by mouth narcotics in a.m. - Status post Toradol IV Q 6 hours x 5 days for anti-inflammatory effect.  - Adjuvant pain therapy: None.  - Wean IV narcotics when pain rated 7/10. Pain rated 10/10 today.  - When able to tolerate oral therapy, convert at 50-75% of IV dose & give PRNs Q 2-3 hours.  - Last CBC 11/13 with Hemoglobin of 4.5 so per hematology 2 U PRBC transfused 11/13; next CBC in am - Monitor bilirubin/LDH Q 72 hours to monitor hemolysis. Current bilirubin: 1.7 --> 1.6, LDH 359 --> 405  - No evidence of aplastic anemia. Reticulocyte count abnormally low for the degree of hemolysis. Parvovirus IgM 0.1 (WNL).  - Ferritin level last checked 03/23/13 and was 618.  - Hold Hydrea given hemoglobin < 6 mg/dL and reticulocyte count < 80K when hemoglobin < 9 mg/dL.  - K pad PRN.  - Continue folic acid.   Active problems:  Pulmonary venous congestion  - Noted on chest x-ray done 07/21/2013. No frank pulmonary edema.  - Given Lasix 20 mg X 1 07/23/2013.  Hypokalemia  - supplemented  Constipation induced by narcotics  - Senna-S for pro motility effect. Add twice daily MiraLAX.  - Increase mobilization as tolerated.  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  - Status post one unit of blood on 07/20/2013, and additional unit 07/24/13. Has an I antigen.  - Hbg 4.5 on 11/13 and patient received additional 2 U PRBC with premedication - appreciate Dr. Cyndie Chime assisting with care.  Fever / SIRS / Acute bronchitis  - Blood cultures negative to date.  - Chest x-ray done 07/21/2013 negative for pneumonia, but suspicious for bronchitis. Azithromycin course completed. - U/A negative for nitrites and leukocytes.   Code Status: Full.  Family Communication: No family at bedside.  Disposition Plan: Home when stable. Likely in am.  IV access:  PICC line placed 07/20/2013 Medical Consultants:  Dr. Cephas Darby, Hematology Other Consultants:  None. Anti-infectives:  Azithromycin 07/21/13--->07/25/13   If 7PM-7AM, please contact night-coverage [email or web address] Password [facility] 07/28/2013, 9:47 AM   LOS: 14 days    HPI/Subjective: No acute overnight events.  Objective: Filed Vitals:   07/28/13 0211 07/28/13 0347 07/28/13 0635 07/28/13 0715  BP: 127/80  108/66   Pulse: 69  80   Temp: 97.7 F (36.5 C)  97.5 F (36.4 C)   TempSrc: Oral  Oral   Resp: 16 15 20 22   Height:      Weight: 79.7 kg (175 lb 11.3 oz)     SpO2: 100% 99% 100% 100%    Intake/Output Summary (Last 24 hours) at 07/28/13 0947 Last data filed at 07/28/13 0714  Gross per  24 hour  Intake 2049.17 ml  Output   3275 ml  Net -1225.83 ml    Exam:   General:  Pt is alert, follows commands appropriately, not in acute distress  Cardiovascular: Regular rate and rhythm, S1/S2, no murmurs, no rubs, no gallops  Respiratory: Clear to auscultation bilaterally, no wheezing, no crackles, no rhonchi  Abdomen: Soft, non tender, non distended, bowel sounds present, no guarding  Extremities: No edema, pulses DP and PT palpable bilaterally  Neuro: Grossly  nonfocal  Data Reviewed: Basic Metabolic Panel:  Recent Labs Lab 07/23/13 0615 07/24/13 0645 07/25/13 0528 07/27/13 0400  NA 138 137 137 137  K 4.2 4.0 4.8 4.6  CL 106 105 106 104  CO2 25 25 24 23   GLUCOSE 102* 90 115* 132*  BUN 14 12 16 12   CREATININE 0.88 0.89 0.86 0.89  CALCIUM 9.0 9.2 9.2 9.0   Liver Function Tests:  Recent Labs Lab 07/21/13 1230 07/23/13 0615 07/27/13 0400  AST 29 20 22   ALT 13 11 14   ALKPHOS 76 78 90  BILITOT 3.1* 1.7* 1.6*  PROT 7.0 6.6 6.8  ALBUMIN 3.7 3.5 3.5   No results found for this basename: LIPASE, AMYLASE,  in the last 168 hours No results found for this basename: AMMONIA,  in the last 168 hours CBC:  Recent Labs Lab 07/21/13 1230 07/23/13 0615 07/24/13 0645 07/25/13 0528 07/27/13 0400  WBC 9.1 11.5* 9.2 20.1* 19.0*  HGB 5.8* 4.6* 4.4* 5.4* 4.5*  HCT 16.1* 13.5* 13.1* 16.3* 13.8*  MCV 91.5 96.4 100.0 98.2 103.8*  PLT 323 341 340 378 328   Cardiac Enzymes: No results found for this basename: CKTOTAL, CKMB, CKMBINDEX, TROPONINI,  in the last 168 hours BNP: No components found with this basename: POCBNP,  CBG: No results found for this basename: GLUCAP,  in the last 168 hours  CULTURE, BLOOD (ROUTINE X 2)     Status: None   Collection Time    07/21/13  5:00 AM      Result Value Range Status   Specimen Description BLOOD PICC   Final   Value: NO GROWTH 5 DAYS     Performed at [facility]   Report Status 07/27/2013 FINAL   Final  URINE CULTURE     Status: None   Collection Time    07/21/13  5:02 AM      Result Value Range Status   Specimen Description URINE, RANDOM   Final   Value: INSIGNIFICANT GROWTH     Performed at [facility]   Report Status 07/22/2013 FINAL   Final  CULTURE, BLOOD (ROUTINE X 2)     Status: None   Collection Time    07/21/13  6:10 AM      Result Value Range Status   Specimen Description BLOOD LEFT HAND   Final   Value: NO GROWTH 5 DAYS     Performed at [facility]   Report Status 07/27/2013 FINAL   Final     Studies: No results found.  Scheduled Meds: . fluticasone  1 spray Each Nare Daily  . folic acid  1 mg Oral q morning - 10a  . HYDROmorphone PCA 0.3 mg/mL   Intravenous Q4H  . morphine  60 mg Oral BID  . polyethylene glycol  17 g Oral BID  . potassium chloride  20 mEq Oral BID  . senna-docusate  1 tablet Oral BID  . sodium chloride  10-40 mL Intracatheter Q12H  Continuous Infusions: . sodium chloride 20 mL/hr at 07/28/13 0344

## 2013-07-28 NOTE — Progress Notes (Addendum)
Upon entering patient's room to administer medication, patient was in the bathroom.  He had disconnected his IV fluids and Dilaudid PCA tubing from his PICC line and had laid the tubing on the pump which was located by the bed.  I questioned patient as to why he had done this and he stated he had to get to the bathroom for a bowel movement and did not want to roll his pump with him.  I assessed PICC line and it was within normal limits.  I changed all IV tubing and cleansed PICC line hub, flushed the line and attached new tubing.  Patient was educated using teach back method that he cannot disconnect his IV tubing at any point as this is only a nursing responsibility and it dramatically increases his risk of a central line infection.  Patient was also educated that if he needed to go to bathroom and needed assistance with his IV pump, to please call for assistance.  Patient demonstrated understanding.  Patient was apologetic and verbalized that he would not do this again.  Dennis Bernard

## 2013-07-29 LAB — COMPREHENSIVE METABOLIC PANEL
AST: 20 U/L (ref 0–37)
Alkaline Phosphatase: 99 U/L (ref 39–117)
BUN: 18 mg/dL (ref 6–23)
CO2: 23 mEq/L (ref 19–32)
Calcium: 9.1 mg/dL (ref 8.4–10.5)
Chloride: 102 mEq/L (ref 96–112)
Creatinine, Ser: 0.76 mg/dL (ref 0.50–1.35)
GFR calc Af Amer: >90 mL/min (ref >90)
GFR calc non Af Amer: >90 mL/min (ref >90)
Glucose, Bld: 101 mg/dL — ABNORMAL HIGH (ref 70–99)
Potassium: 4.7 mEq/L (ref 3.5–5.1)
Total Bilirubin: 3.9 mg/dL — ABNORMAL HIGH (ref 0.3–1.2)
Total Protein: 7.6 g/dL (ref 6.0–8.3)

## 2013-07-29 LAB — CBC
Hemoglobin: 6.2 g/dL — CL (ref 13.0–17.0)
MCH: 33.2 pg (ref 26.0–34.0)
MCV: 102.7 fL — ABNORMAL HIGH (ref 78.0–100.0)
Platelets: 293 10*3/uL (ref 150–400)
RBC: 1.87 MIL/uL — ABNORMAL LOW (ref 4.22–5.81)
RDW: 17.3 % — ABNORMAL HIGH (ref 11.5–15.5)
WBC: 13.5 10*3/uL — ABNORMAL HIGH (ref 4.0–10.5)

## 2013-07-29 MED ORDER — HYDROMORPHONE HCL 4 MG PO TABS: 4.0000 mg | ORAL_TABLET | ORAL | Status: DC | PRN

## 2013-07-29 MED ORDER — HYDROXYUREA 500 MG PO CAPS: 2000.0000 mg | ORAL_CAPSULE | Freq: Every morning | ORAL | Status: DC

## 2013-07-29 MED ORDER — DIPHENHYDRAMINE HCL 25 MG PO TABS: 25.0000 mg | ORAL_TABLET | Freq: Three times a day (TID) | ORAL | Status: AC | PRN

## 2013-07-29 MED ORDER — PROMETHAZINE HCL 25 MG PO TABS: 25.0000 mg | ORAL_TABLET | Freq: Four times a day (QID) | ORAL | Status: DC | PRN

## 2013-07-29 MED ORDER — MORPHINE SULFATE ER 60 MG PO TBCR: 60.0000 mg | EXTENDED_RELEASE_TABLET | Freq: Two times a day (BID) | ORAL | Status: DC

## 2013-07-29 NOTE — Progress Notes (Signed)
RUA PICC d/c'ed per order.  Pt verbalizes understanding of site care and interventions for bleeding or infection via teach back method.  NO ADN or signs of infection noted.

## 2013-07-29 NOTE — Progress Notes (Signed)
Patient discharged to home with family, discharge instructions reviewed with patient who verbalized understanding. New RX's given to patient. PICC line removed by IV RN.

## 2013-07-29 NOTE — Discharge Summary (Signed)
Physician Discharge Summary  Dennis Bernard UJW:[id number] DOB: 1985/11/06 DOA: 07/14/2013  PCP: No primary provider on file.  Admit date: 07/14/2013 Discharge date: 07/29/2013  Recommendations for Outpatient Follow-up:  1. Prescriptions for pain medications provided. 2. Please note that for blood transfusions you had to be pre-medicated with soul-  medrol and benadryl. Please let know you PCP and for your future care you will need pre-medication should you need blood transfusion. 3. You last hemoglobin was 6.5 ( up from 4.5 and 2 units of blood) 4. Follow up with PCP per your scheduled appt and have them recheck your hemoglobin to make sure it is stable. 5. WBC count trended down from 19 to 13.5 at the time of discharge 6. 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   Assessment/Plan:   Principal Problem:  Sickle cell pain crisis / backache  - Pain management in hospital: Continue long acting medications at usual home dose: MS Contin 60 mg Q 12 hours. Avoid increasing dose further to prevent further issues with tolerance.  - Pt has received custom dose PCA dilaudid while in acute crisis. I spoke with patient extensively in regards to PCA so will switch to PO meds and plan for discharge today. - Status post Toradol IV Q 6 hours x 5 days for anti-inflammatory effect.  - Adjuvant pain therapy: None.  - Wean IV narcotics  when pain rated 7/10. Pain rated 7/10. Switch to PO regimen. We went through med rec to see which ones he is using at home so those narcotics were prescribed. - CBC 11/13 with Hemoglobin of 4.5 so per hematology 2 U PRBC transfused 11/13; Hgb this am 6.2 - Monitored in hospital bilirubin/LDH Q 72 hours (to monitor hemolysis). Current bilirubin: 1.7 --> 1.6, LDH 359 --> 405  - No evidence of aplastic anemia. Reticulocyte count abnormally low for the degree of hemolysis. Parvovirus IgM 0.1 (WNL).  - Ferritin level last checked 03/23/13 and was 618.  - Held Hydrea given hemoglobin < 6 mg/dL - K pad PRN.  - Continue folic acid.  Active problems:  Pulmonary venous congestion  - Noted on chest x-ray done 07/21/2013. No frank pulmonary edema.  - Given Lasix 20 mg X 1 07/23/2013.  Hypokalemia  - supplemented  Constipation induced by narcotics  - Senna-S for pro motility effect. Add twice daily MiraLAX.  - Increase mobilization as tolerated.  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  - Status post one unit of blood on 07/20/2013, and additional unit 07/24/13. Has an I antigen.  - Hbg 4.5 on11/13 and has received additional  2 U PRBC with premedication; hemoglobin today 6.2 - appreciate Dr. Cyndie Chime assisting with care.  Fever / SIRS / Acute bronchitis  - Blood cultures negative to date.  -  Chest x-ray done 07/21/2013 negative for pneumonia, but suspicious for bronchitis. Azithromycin course completed.  - U/A negative for nitrites and leukocytes.   Code Status: Full.  Family Communication: No family at bedside.  Disposition Plan: Home today   IV access:  PICC line placed 07/20/2013 Medical Consultants:  Dr. Cephas Darby, Hematology Other Consultants:  None. Anti-infectives:  Azithromycin  07/21/13--->07/25/13   Signed:  [name], MD  [facility] 07/29/2013, 11:24 AM  Pager #: [phone]    Discharge Exam: Filed Vitals:   07/29/13 1030  BP:   Pulse:   Temp:   Resp: 16   Filed Vitals:   07/29/13 0533 07/29/13 0745 07/29/13 1000 07/29/13 1030  BP: 107/68  110/72   Pulse: 97  104   Temp: 98.7 F (37.1 C)  97.9 F (36.6 C)   TempSrc: Oral  Oral   Resp: 15 16 16 16   Height:      Weight: 83.689 kg (184 lb 8 oz)     SpO2: 100% 100% 100% 100%    General: Pt is alert, follows commands appropriately, not in acute distress Cardiovascular: Regular rate and rhythm, S1/S2 +, no murmurs, no rubs, no gallops Respiratory: Clear to auscultation bilaterally, no wheezing, no crackles, no rhonchi Abdominal: Soft, non tender, non distended, bowel sounds +, no guarding Extremities: no edema, no cyanosis, pulses palpable bilaterally DP and PT Neuro: Grossly nonfocal  Discharge Instructions  Discharge Orders   Future Orders Complete By Expires   Call MD for:  difficulty breathing, headache or visual disturbances  As directed    Call MD for:  hives  As directed    Call MD for:  persistant dizziness or light-headedness  As directed    Call MD for:  severe uncontrolled pain  As directed    Diet - low sodium heart healthy  As directed    Discharge instructions  As directed    Comments:     1. Prescriptions for pain medications provided. 2. Please note that for blood transfusions you had to be pre-medicated with soul-  medrol and benadryl. Please let know you PCP and for your future care you will need pre-medication should you need blood transfusion. 3. You last hemoglobin was 6.5 ( up from 4.5 and 2 units of blood) 4. Follow up with PCP per your scheduled appt and have them recheck your hemoglobin to make sure it is stable. 5. WBC count trended down from 19 to 13.5 at the time of discharge.   Increase activity slowly  As directed        Medication List          albuterol 108 (90 BASE) MCG/ACT inhaler  Commonly known as:  PROVENTIL HFA;VENTOLIN HFA  Inhale 1 puff into the lungs every 6 (six) hours as needed for wheezing or shortness of breath. wheezing     diphenhydrAMINE 25 MG tablet  Commonly known as:  BENADRYL  Take 1 tablet (25 mg total) by mouth every 8 (eight) hours as needed for itching.     fluticasone 50 MCG/ACT nasal spray  Commonly known as:  FLONASE  Place 1 spray into the nose daily.     folic acid 1 MG tablet  Commonly known as:  FOLVITE  Take 1 mg by mouth every morning.     HYDROmorphone 4 MG tablet  Commonly known as:  DILAUDID  Take 1 tablet (4 mg total) by mouth every 4 (four) hours as needed.     hydroxyurea 500 MG  capsule  Commonly known as:  HYDREA  Take 4 capsules (2,000 mg total) by mouth every morning. May take with food to minimize GI side effects.     morphine 60 MG 12 hr tablet  Commonly known as:  MS CONTIN  Take 1 tablet (60 mg total) by mouth 2 (two) times daily.     promethazine 25 MG tablet  Commonly known as:  PHENERGAN  Take 1 tablet (25 mg total) by mouth every 6 (six) hours as needed for nausea.     sennosides-docusate sodium 8.6-50 MG tablet  Commonly known as:  SENOKOT-S  Take 1 tablet by mouth 2 (two) times daily.           Follow-up Information   Schedule an appointment as soon as possible for a visit with Levert Feinstein, MD.   Specialty:  Oncology   Contact information:   [address] [place] [address] [phone]        The results of significant diagnostics from this hospitalization (including imaging, microbiology, ancillary and laboratory) are listed below for reference.    Significant Diagnostic Studies: Dg Chest 2 View  07/14/2013   CLINICAL DATA:  sickle cell disease with pain  EXAM: CHEST  2 VIEW  COMPARISON:  June 03, 2013  FINDINGS: The central catheter is been removed. There is no edema or consolidation. Heart is mildly enlarged with normal  pulmonary vascularity. No adenopathy. No pneumothorax.  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 There are multiple endplate the infarcts in the thoracic and lumbar spine consistent with known sickle cell disease.  IMPRESSION: No edema or consolidation. Stable cardiac prominence. Bony changes present consistent with known sickle cell disease.   Electronically Signed   By: Bretta Bang M.D.   On: 07/14/2013 14:05   Dg Chest Port 1 View  07/21/2013   CLINICAL DATA:  Sudden onset of fever. History of sickle cell disease.  EXAM: PORTABLE CHEST - 1 VIEW  COMPARISON:  Chest x-ray 07/14/2013.  FINDINGS: There is a right upper extremity PICC with tip terminating in the mid superior vena cava. Lung volumes are slightly low. No consolidative airspace disease. Diffuse peribronchial cuffing. No pleural effusions. Mild cephalization of the pulmonary vasculature. Mild cardiomegaly. The patient is rotated to the right on today's exam, resulting in distortion of the mediastinal contours and reduced diagnostic sensitivity and specificity for mediastinal pathology. Postoperative changes of left shoulder hemiarthroplasty.  IMPRESSION: 1. Diffuse peribronchial cuffing may suggest bronchitis. 2. Mild cardiomegaly with pulmonary venous congestion, but no frank pulmonary edema. 3. Postoperative changes and support apparatus, as above.   Electronically Signed   By: Trudie Reed M.D.   On: 07/21/2013 03:19    Microbiology: Recent Results (from the past 240 hour(s))  CULTURE, BLOOD (ROUTINE X 2)     Status: None   Collection Time    07/21/13  5:00 AM      Result Value Range Status   Specimen Description BLOOD PICC   Final   Special Requests BOTTLES DRAWN AEROBIC AND ANAEROBIC 4.5 EACH   Final   Culture  Setup Time     Final   Value: 07/21/2013 09:58     Performed at [facility]   Culture     Final   Value: NO GROWTH 5 DAYS     Performed at [facility]   Report Status 07/27/2013 FINAL    Final  URINE CULTURE     Status: None   Collection  Time    07/21/13  5:02 AM      Result Value Range Status   Specimen Description URINE, RANDOM   Final   Special Requests none Normal   Final   Culture  Setup Time     Final   Value: 07/21/2013 11:06     Performed at [facility] Count     Final   Value: 2,000 COLONIES/ML     Performed at [facility]   Culture     Final   Value: INSIGNIFICANT GROWTH     Performed at [facility]   Report Status 07/22/2013 FINAL   Final  CULTURE, BLOOD (ROUTINE X 2)     Status: None   Collection Time    07/21/13  6:10 AM      Result Value Range Status   Specimen Description BLOOD LEFT HAND   Final   Special Requests BOTTLES DRAWN AEROBIC ONLY 5CC   Final   Culture  Setup Time     Final   Value: 07/21/2013 09:59     Performed at [facility]   Culture     Final   Value: NO GROWTH 5 DAYS     Performed at [facility]   Report Status 07/27/2013 FINAL   Final     Labs: Basic Metabolic Panel:  Recent Labs Lab 07/23/13 0615 07/24/13 0645 07/25/13 0528 07/27/13 0400 07/29/13 0527  NA 138 137 137 137 134*  K 4.2 4.0 4.8 4.6 4.7  CL 106 105 106 104 102  CO2 25 25 24 23 23   GLUCOSE 102* 90 115* 132* 101*  BUN 14 12 16 12 18   CREATININE 0.88 0.89 0.86 0.89 0.76  CALCIUM 9.0 9.2 9.2 9.0 9.1   Liver Function Tests:  Recent Labs Lab 07/23/13 0615 07/27/13 0400 07/29/13 0527  AST 20 22 20   ALT 11 14 16   ALKPHOS 78 90 99  BILITOT 1.7* 1.6* 3.9*  PROT 6.6 6.8 7.6  ALBUMIN 3.5 3.5 3.9   No results found for this basename: LIPASE, AMYLASE,  in the last 168 hours No results found for this basename: AMMONIA,  in the last 168 hours CBC:  Recent Labs Lab 07/23/13 0615 07/24/13 0645 07/25/13 0528 07/27/13 0400 07/29/13 0527  WBC 11.5* 9.2 20.1* 19.0* 13.5*  HGB 4.6* 4.4* 5.4* 4.5* 6.2*  HCT 13.5* 13.1* 16.3* 13.8* 19.2*  MCV 96.4 100.0 98.2 103.8* 102.7*  PLT 341 340 378 328 293    Cardiac Enzymes: No results found for this basename: CKTOTAL, CKMB, CKMBINDEX, TROPONINI,  in the last 168 hours BNP: BNP (last 3 results) No results found for this basename: PROBNP,  in the last 8760 hours CBG: No results found for this basename: GLUCAP,  in the last 168 hours  Time coordinating discharge: Over 30 minutes

## 2013-08-07 ENCOUNTER — Emergency Department (HOSPITAL_COMMUNITY): Payer: Medicare Other

## 2013-08-07 ENCOUNTER — Inpatient Hospital Stay (HOSPITAL_COMMUNITY)
Admission: EM | Admit: 2013-08-07 | Discharge: 2013-08-28 | DRG: 812 | Disposition: A | Discharge status: Home / Self Care | Payer: Medicare Other | Attending: Internal Medicine | Admitting: Internal Medicine

## 2013-08-07 ENCOUNTER — Encounter (HOSPITAL_COMMUNITY): Payer: Self-pay | Admitting: Emergency Medicine

## 2013-08-07 DIAGNOSIS — Z833 Family history of diabetes mellitus: Secondary | ICD-10-CM

## 2013-08-07 DIAGNOSIS — R5081 Fever presenting with conditions classified elsewhere: Secondary | ICD-10-CM | POA: Diagnosis present

## 2013-08-07 DIAGNOSIS — Z87898 Personal history of other specified conditions: Secondary | ICD-10-CM

## 2013-08-07 DIAGNOSIS — R6510 Systemic inflammatory response syndrome (SIRS) of non-infectious origin without acute organ dysfunction: Secondary | ICD-10-CM

## 2013-08-07 DIAGNOSIS — Z79899 Other long term (current) drug therapy: Secondary | ICD-10-CM

## 2013-08-07 DIAGNOSIS — D571 Sickle-cell disease without crisis: Secondary | ICD-10-CM | POA: Diagnosis present

## 2013-08-07 DIAGNOSIS — J40 Bronchitis, not specified as acute or chronic: Secondary | ICD-10-CM | POA: Diagnosis present

## 2013-08-07 DIAGNOSIS — L299 Pruritus, unspecified: Secondary | ICD-10-CM | POA: Diagnosis not present

## 2013-08-07 DIAGNOSIS — N390 Urinary tract infection, site not specified: Secondary | ICD-10-CM | POA: Diagnosis present

## 2013-08-07 DIAGNOSIS — M549 Dorsalgia, unspecified: Secondary | ICD-10-CM

## 2013-08-07 DIAGNOSIS — W19XXXA Unspecified fall, initial encounter: Secondary | ICD-10-CM | POA: Diagnosis present

## 2013-08-07 DIAGNOSIS — T80A0XD Non-ABO incompatibility reaction due to transfusion of blood or blood products, unspecified, subsequent encounter: Secondary | ICD-10-CM

## 2013-08-07 DIAGNOSIS — T4275XA Adverse effect of unspecified antiepileptic and sedative-hypnotic drugs, initial encounter: Secondary | ICD-10-CM | POA: Diagnosis present

## 2013-08-07 DIAGNOSIS — F1120 Opioid dependence, uncomplicated: Secondary | ICD-10-CM

## 2013-08-07 DIAGNOSIS — G8929 Other chronic pain: Secondary | ICD-10-CM

## 2013-08-07 DIAGNOSIS — S62609A Fracture of unspecified phalanx of unspecified finger, initial encounter for closed fracture: Secondary | ICD-10-CM | POA: Diagnosis present

## 2013-08-07 DIAGNOSIS — Y849 Medical procedure, unspecified as the cause of abnormal reaction of the patient, or of later complication, without mention of misadventure at the time of the procedure: Secondary | ICD-10-CM | POA: Diagnosis not present

## 2013-08-07 DIAGNOSIS — D72829 Elevated white blood cell count, unspecified: Secondary | ICD-10-CM

## 2013-08-07 DIAGNOSIS — R509 Fever, unspecified: Secondary | ICD-10-CM

## 2013-08-07 DIAGNOSIS — J209 Acute bronchitis, unspecified: Secondary | ICD-10-CM

## 2013-08-07 DIAGNOSIS — B965 Pseudomonas (aeruginosa) (mallei) (pseudomallei) as the cause of diseases classified elsewhere: Secondary | ICD-10-CM | POA: Diagnosis present

## 2013-08-07 DIAGNOSIS — D5700 Hb-SS disease with crisis, unspecified: Principal | ICD-10-CM | POA: Diagnosis present

## 2013-08-07 DIAGNOSIS — R0989 Other specified symptoms and signs involving the circulatory and respiratory systems: Secondary | ICD-10-CM

## 2013-08-07 DIAGNOSIS — Z832 Family history of diseases of the blood and blood-forming organs and certain disorders involving the immune mechanism: Secondary | ICD-10-CM

## 2013-08-07 DIAGNOSIS — R5381 Other malaise: Secondary | ICD-10-CM

## 2013-08-07 DIAGNOSIS — G894 Chronic pain syndrome: Secondary | ICD-10-CM

## 2013-08-07 DIAGNOSIS — S62605A Fracture of unspecified phalanx of left ring finger, initial encounter for closed fracture: Secondary | ICD-10-CM

## 2013-08-07 DIAGNOSIS — Z9114 Patient's other noncompliance with medication regimen: Secondary | ICD-10-CM

## 2013-08-07 DIAGNOSIS — R2232 Localized swelling, mass and lump, left upper limb: Secondary | ICD-10-CM

## 2013-08-07 DIAGNOSIS — Z96619 Presence of unspecified artificial shoulder joint: Secondary | ICD-10-CM

## 2013-08-07 DIAGNOSIS — K5909 Other constipation: Secondary | ICD-10-CM | POA: Diagnosis not present

## 2013-08-07 DIAGNOSIS — T8092XA Unspecified transfusion reaction, initial encounter: Secondary | ICD-10-CM | POA: Diagnosis not present

## 2013-08-07 DIAGNOSIS — S62605K Fracture of unspecified phalanx of left ring finger, subsequent encounter for fracture with nonunion: Secondary | ICD-10-CM

## 2013-08-07 LAB — CBC WITH DIFFERENTIAL/PLATELET
Basophils Absolute: 0 10*3/uL (ref 0.0–0.1)
Basophils Relative: 0 % (ref 0–1)
Hemoglobin: 6.1 g/dL — CL (ref 13.0–17.0)
Lymphocytes Relative: 36 % (ref 12–46)
Lymphs Abs: 5.3 10*3/uL — ABNORMAL HIGH (ref 0.7–4.0)
MCH: 33.5 pg (ref 26.0–34.0)
MCHC: 34.1 g/dL (ref 30.0–36.0)
Monocytes Absolute: 1.5 10*3/uL — ABNORMAL HIGH (ref 0.1–1.0)
Neutrophils Relative %: 53 % (ref 43–77)
Platelets: 438 10*3/uL — ABNORMAL HIGH (ref 150–400)
RBC: 1.82 MIL/uL — ABNORMAL LOW (ref 4.22–5.81)
RDW: 17.2 % — ABNORMAL HIGH (ref 11.5–15.5)
WBC: 14.8 10*3/uL — ABNORMAL HIGH (ref 4.0–10.5)
nRBC: 8 /100 WBC — ABNORMAL HIGH

## 2013-08-07 LAB — COMPREHENSIVE METABOLIC PANEL
ALT: 13 U/L (ref 0–53)
AST: 27 U/L (ref 0–37)
Albumin: 3.6 g/dL (ref 3.5–5.2)
CO2: 25 mEq/L (ref 19–32)
Calcium: 9 mg/dL (ref 8.4–10.5)
Creatinine, Ser: 1.03 mg/dL (ref 0.50–1.35)
GFR calc non Af Amer: >90 mL/min (ref >90)
Sodium: 141 mEq/L (ref 135–145)
Total Protein: 6.9 g/dL (ref 6.0–8.3)

## 2013-08-07 LAB — URINALYSIS, ROUTINE W REFLEX MICROSCOPIC
Bilirubin Urine: NEGATIVE
Leukocytes, UA: NEGATIVE
Nitrite: NEGATIVE
Specific Gravity, Urine: 1.018 (ref 1.005–1.030)
Urobilinogen, UA: 0.2 mg/dL (ref 0.0–1.0)
pH: 5.5 (ref 5.0–8.0)

## 2013-08-07 LAB — POCT I-STAT TROPONIN I

## 2013-08-07 LAB — RETICULOCYTES
RBC.: 1.82 MIL/uL — ABNORMAL LOW (ref 4.22–5.81)
Retic Count, Absolute: 380.4 10*3/uL — ABNORMAL HIGH (ref 19.0–186.0)

## 2013-08-07 MED ORDER — SENNA-DOCUSATE SODIUM 8.6-50 MG PO TABS
1.0000 | ORAL_TABLET | Freq: Two times a day (BID) | ORAL | Status: DC
Start: 2013-08-07 — End: 2013-08-07

## 2013-08-07 MED ORDER — ONDANSETRON HCL 4 MG/2ML IJ SOLN
4.0000 mg | Freq: Once | INTRAMUSCULAR | Status: AC
  Administered 2013-08-07: 4 mg via INTRAVENOUS
  Filled 2013-08-07: qty 2

## 2013-08-07 MED ORDER — DIPHENHYDRAMINE HCL 50 MG/ML IJ SOLN
25.0000 mg | Freq: Once | INTRAMUSCULAR | Status: AC
  Administered 2013-08-07: 25 mg via INTRAVENOUS
  Filled 2013-08-07: qty 1

## 2013-08-07 MED ORDER — PROMETHAZINE HCL 25 MG PO TABS
25.0000 mg | ORAL_TABLET | Freq: Four times a day (QID) | ORAL | Status: DC | PRN
  Administered 2013-08-09 – 2013-08-21 (×5): 25 mg via ORAL
  Filled 2013-08-07 (×5): qty 1

## 2013-08-07 MED ORDER — HYDROMORPHONE 0.3 MG/ML IV SOLN
INTRAVENOUS | Status: DC
  Administered 2013-08-07: via INTRAVENOUS
  Administered 2013-08-08: 6.39 mg via INTRAVENOUS
  Administered 2013-08-08: 0.3 mg via INTRAVENOUS
  Administered 2013-08-08: 5.59 mg via INTRAVENOUS
  Administered 2013-08-08: 1.8 mg via INTRAVENOUS
  Administered 2013-08-08: 02:00:00 via INTRAVENOUS
  Filled 2013-08-07 (×3): qty 25

## 2013-08-07 MED ORDER — SODIUM CHLORIDE 0.9 % IV BOLUS (SEPSIS)
1000.0000 mL | Freq: Once | INTRAVENOUS | Status: AC
  Administered 2013-08-07: 1000 mL via INTRAVENOUS

## 2013-08-07 MED ORDER — SODIUM CHLORIDE 0.9 % IV SOLN
INTRAVENOUS | Status: DC
  Administered 2013-08-08 – 2013-08-19 (×21): via INTRAVENOUS

## 2013-08-07 MED ORDER — FOLIC ACID 1 MG PO TABS
1.0000 mg | ORAL_TABLET | Freq: Every morning | ORAL | Status: DC
  Filled 2013-08-07: qty 1

## 2013-08-07 MED ORDER — ALBUTEROL SULFATE (5 MG/ML) 0.5% IN NEBU
5.0000 mg | INHALATION_SOLUTION | Freq: Once | RESPIRATORY_TRACT | Status: AC
  Administered 2013-08-07: 5 mg via RESPIRATORY_TRACT
  Filled 2013-08-07: qty 1

## 2013-08-07 MED ORDER — HYDROMORPHONE HCL PF 2 MG/ML IJ SOLN
2.0000 mg | Freq: Once | INTRAMUSCULAR | Status: AC
  Administered 2013-08-07: 2 mg via INTRAVENOUS
  Filled 2013-08-07: qty 1

## 2013-08-07 MED ORDER — FOLIC ACID 1 MG PO TABS
1.0000 mg | ORAL_TABLET | Freq: Every day | ORAL | Status: DC
  Administered 2013-08-08 – 2013-08-28 (×21): 1 mg via ORAL
  Filled 2013-08-07 (×21): qty 1

## 2013-08-07 MED ORDER — ALBUTEROL SULFATE HFA 108 (90 BASE) MCG/ACT IN AERS: 1.0000 | INHALATION_SPRAY | Freq: Four times a day (QID) | RESPIRATORY_TRACT | Status: DC | PRN

## 2013-08-07 MED ORDER — SENNOSIDES-DOCUSATE SODIUM 8.6-50 MG PO TABS
1.0000 | ORAL_TABLET | Freq: Two times a day (BID) | ORAL | Status: DC
  Administered 2013-08-08 – 2013-08-28 (×42): 1 via ORAL
  Filled 2013-08-07 (×50): qty 1

## 2013-08-07 MED ORDER — HYDROMORPHONE 0.3 MG/ML IV SOLN: INTRAVENOUS | Status: DC

## 2013-08-07 MED ORDER — FLUTICASONE PROPIONATE 50 MCG/ACT NA SUSP
1.0000 | Freq: Every day | NASAL | Status: DC
  Administered 2013-08-08 – 2013-08-28 (×21): 1 via NASAL
  Filled 2013-08-07: qty 16

## 2013-08-07 MED ORDER — HEPARIN SODIUM (PORCINE) 5000 UNIT/ML IJ SOLN
5000.0000 [IU] | Freq: Three times a day (TID) | INTRAMUSCULAR | Status: DC
  Administered 2013-08-08 – 2013-08-25 (×52): 5000 [IU] via SUBCUTANEOUS
  Filled 2013-08-07 (×57): qty 1

## 2013-08-07 MED ORDER — MORPHINE SULFATE ER 30 MG PO TBCR
60.0000 mg | EXTENDED_RELEASE_TABLET | Freq: Two times a day (BID) | ORAL | Status: DC
  Administered 2013-08-08 – 2013-08-18 (×22): 60 mg via ORAL
  Filled 2013-08-07 (×22): qty 2

## 2013-08-07 MED ORDER — DIPHENHYDRAMINE HCL 25 MG PO TABS
25.0000 mg | ORAL_TABLET | Freq: Three times a day (TID) | ORAL | Status: DC | PRN
  Administered 2013-08-08 – 2013-08-09 (×2): 25 mg via ORAL
  Filled 2013-08-07 (×3): qty 1

## 2013-08-07 NOTE — H&P (Signed)
[facility] History and Physical  Dennis Bernard ZOX:[id number] DOB: 01/21/1986 DOA: 08/07/2013  Referring physician: ED PCP: No primary provider on file. Dr. Cyndie Chime  Chief Complaint: Sickle cell pain crisis  HPI: Dennis Bernard is a 27 y.o. male with h/o sickle cell anemia, frequent admissions for crisis.  Patient presents with typical low back pain symptoms from sickle cell crisis ongoing for the past 3 days.  Developed chest pain and SOB today.  Pain is unrelieved by home meds.   Additionally he has left ring finger pain and swelling which he woke up with this morning.  Review of Systems: 12 systems reviewed and otherwise negative.  Past Medical History  Diagnosis Date  . Sickle cell disease   . Avascular necrosis of femur head, right   . H/O allergic rhinitis   . H/O hypokalemia   . Chronic pain syndrome   . H/O wheezing     with colds  . 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 Cholecystectomy  1995  . Portacath placement Left 07/04/2012  . Exchange transfusion  02/2008    perioperatively for shoulder surgery  . 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 He has never used smokeless tobacco. He reports that he does not drink alcohol or use illicit drugs.   No Known Allergies  Family History  Problem Relation Age of Onset  . Sickle cell anemia Brother   . Sickle cell trait Father   . Sickle cell trait Mother   . Diabetes Mellitus II Mother   . Sickle cell anemia Paternal Uncle   . Asthma Neg Hx   . Allergic rhinitis Neg Hx      Prior to Admission medications   Medication Sig Start Date End Date Taking? Authorizing Provider  albuterol (PROVENTIL HFA;VENTOLIN HFA) 108 (90 BASE) MCG/ACT inhaler Inhale 1 puff into the lungs every 6 (six) hours as needed for wheezing or shortness of breath. wheezing 11/27/12  Yes Vassie Loll, MD   diphenhydrAMINE (BENADRYL) 25 MG tablet Take 1 tablet (25 mg total) by mouth every 8 (eight) hours as needed for itching. 07/29/13  Yes Alison Murray, MD  fluticasone [facility]) 50 MCG/ACT nasal spray Place 1 spray into the nose daily. 05/19/13  Yes Kathlen Mody, MD  folic acid (FOLVITE) 1 MG tablet Take 1 mg by mouth every morning. 11/27/12  Yes Vassie Loll, MD  HYDROmorphone (DILAUDID) 4 MG tablet Take 1 tablet (4 mg total) by mouth every 4 (four) hours as needed. 07/29/13  Yes Alison Murray, MD  morphine (MS CONTIN) 60 MG 12 hr tablet Take 1 tablet (60 mg total) by mouth 2 (two) times daily. 07/29/13  Yes Alison Murray, MD  promethazine (PHENERGAN) 25 MG tablet Take 1 tablet (25 mg total) by mouth every 6 (six) hours as needed for nausea. 07/29/13  Yes Alison Murray, MD  sennosides-docusate sodium (SENOKOT-S) 8.6-50 MG tablet Take 1 tablet by mouth 2 (two) times daily. 02/14/13  Yes Altha Harm, MD   Physical Exam: Filed Vitals:   08/07/13 1937  BP: 106/64  Pulse:   Temp: 98.5 F (36.9 C)  Resp: 14    General:  NAD, resting comfortably in bed Eyes: PEERLA EOMI ENT: mucous membranes moist Neck: supple w/o JVD Cardiovascular: RRR w/o MRG Respiratory: CTA B Abdomen: soft, nt, nd, bs+ Skin: no rash nor lesion Musculoskeletal: MAE,  L ring finger splinted  Psychiatric: normal tone and affect Neurologic: AAOx3, grossly non-focal  Labs on Admission:  Basic Metabolic Panel:  Recent Labs Lab 08/07/13 2000  NA 141  K 4.0  CL 108  CO2 25  GLUCOSE 78  BUN 11  CREATININE 1.03  CALCIUM 9.0   Liver Function Tests:  Recent Labs Lab 08/07/13 2000  AST 27  ALT 13  ALKPHOS 91  BILITOT 1.5*  PROT 6.9  ALBUMIN 3.6   No results found for this basename: LIPASE, AMYLASE,  in the last 168 hours No results found for this basename: AMMONIA,  in the last 168 hours CBC:  Recent Labs Lab 08/07/13 2000  WBC 14.8*  NEUTROABS 7.9*  HGB 6.1*  HCT 17.9*  MCV 98.4  PLT 438*    Cardiac Enzymes: No results found for this basename: CKTOTAL, CKMB, CKMBINDEX, TROPONINI,  in the last 168 hours  BNP (last 3 results) No results found for this basename: PROBNP,  in the last 8760 hours CBG: No results found for this basename: GLUCAP,  in the last 168 hours  Radiological Exams on Admission: Dg Chest 2 View  08/07/2013   CLINICAL DATA:  Sickle cell crisis.  Pain.  EXAM: CHEST  2 VIEW  COMPARISON:  07/21/2013  FINDINGS: Low lung volumes are present, causing crowding of the pulmonary vasculature. Cardiothoracic index 55%, at least partially due to the low lung volumes, although overall mild cardiomegaly is suspected. Prior PICC line has been removed. Left proximal humeral implant.  Minimal airway thickening. No airspace opacity. Endplate findings in the thoracic spine characteristic of sickle cell disease.  IMPRESSION: 1. Mild airway thickening may reflect bronchitis or reactive airways disease. No airspace opacity is currently observed 2. Low lung volumes are present, causing crowding of the pulmonary vasculature. 3. Mild cardiomegaly.   Electronically Signed   By: Herbie Baltimore M.D.   On: 08/07/2013 19:24   Dg Finger Ring Left  08/07/2013   CLINICAL DATA:  Left ring finger injury and pain.  EXAM: LEFT RING FINGER 2+V  COMPARISON:  None.  FINDINGS: A comminuted, mildly displaced fracture is seen involving the base of middle phalanx with intra-articular extension into the PIP joint. No evidence of dislocation.  IMPRESSION: Comminuted fracture of base of middle phalanx, with intra-articular extension.   Electronically Signed   By: Myles Rosenthal M.D.   On: 08/07/2013 19:20    EKG: Independently reviewed.   Assessment/Plan Principal Problem:   Sickle cell pain crisis Active Problems:   Sickle cell anemia   Fracture of phalanx of left ring finger   1. Sickle cell pain crisis - continuing MS contin sustained release home med, putting patient on his usual PCA dilaudid 0.8mg   q10 min, 6mg  4 hr lock out that he gets when he is admitted.  HGB 6.1 however his HGB is typically below 7 at baseline and he was discharged with a 6.5 last admit so will hold off on transfusing for now.  CXR negative for findings concerning for acute chest at this time. 2. Fracture of phalanx of left ring finger - likely will need ortho consult in am, may require pinning.    Code Status: Full (must indicate code status--if unknown or must be presumed, indicate so) Family Communication: No family in room (indicate person spoken with, if applicable, with phone number if by telephone) Disposition Plan: Admit to inpatient (indicate anticipated LOS)  Time spent: 70 min  Yuliya Nova M. [facility] Pager [phone]  If 7PM-7AM, please contact night-coverage [email or web address]  Password [email or web address] 08/07/2013, 10:06 PM

## 2013-08-07 NOTE — ED Notes (Signed)
Patient c/o sickle cell lower back pain and left wrist pain x 3 days. Patient also c/o SOB.

## 2013-08-07 NOTE — ED Notes (Signed)
Attempted IV start x 1 without success. IV team paged by secretary.

## 2013-08-07 NOTE — ED Provider Notes (Signed)
CSN: [id number]     Arrival date & time 08/07/13  1738 History   First MD Initiated Contact with Patient 08/07/13 1746     Chief Complaint  Patient presents with  . Sickle Cell Pain Crisis   (Consider location/radiation/quality/duration/timing/severity/associated sxs/prior Treatment) HPI Comments: Patient is a 27 year old male with a past medical history sickle cell disease who presents to the emergency department complaining of a sickle cell crisis x3 days. States he is having pain in bilateral wrists and his lower back, unrelieved by home medications rated 10 out of 10. He called his PCP who advised him to go to the emergency department. The last time he was in the hospital he had a blood transfusion and states in the past he's had a delayed reaction to blood transfusions. This morning he woke up with his left ring finger swollen and painful. No known injury or trauma. He also developed chest pain and shortness of breath intermittently today. Admits to associated nausea without vomiting. Also states his urine is darker than normal. Denies fever, chills, abdominal pain, urinary changes or recent illness.  The history is provided by the patient.    Past Medical History  Diagnosis Date  . Sickle cell disease   . Avascular necrosis of femur head, right   . H/O allergic rhinitis   . H/O hypokalemia   . Chronic pain syndrome   . H/O wheezing     with colds  . 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 Cholecystectomy  1995  . Portacath placement Left 07/04/2012  . Exchange transfusion  02/2008    perioperatively for shoulder surgery  . 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 Sickle cell anemia Brother   . Sickle cell trait Father   . Sickle cell trait Mother   . Diabetes Mellitus II Mother   . Sickle cell anemia Paternal Uncle   . Asthma Neg Hx   . Allergic rhinitis Neg  Hx    History  Substance Use Topics  . Smoking status: Never Smoker   . Smokeless tobacco: Never Used  . Alcohol Use: No    Review of Systems  Respiratory: Positive for shortness of breath.   Cardiovascular: Positive for chest pain.  Gastrointestinal: Positive for nausea.  Genitourinary:       Positive for dark urine.  Musculoskeletal: Positive for arthralgias (bilateral wrist pain, left ring finger pain and swelling) and back pain.  All other systems reviewed and are negative.    Allergies  Review of patient's allergies indicates no known allergies.  Home Medications   No current outpatient prescriptions on file. BP 115/70  Pulse 92  Temp(Src) 98.9 F (37.2 C) (Oral)  Resp 17  Ht 5\' 9"  (1.753 m)  Wt 176 lb 9.4 oz (80.1 kg)  BMI 26.07 kg/m2  SpO2 94% Physical Exam  Nursing note and vitals reviewed. Constitutional: He is oriented to person, place, and time. He appears well-developed and well-nourished. No distress.  HENT:  Head: Normocephalic and atraumatic.  Mouth/Throat: Oropharynx is clear and moist.  Eyes: Conjunctivae and EOM are normal. Pupils are equal, round, and reactive to light. No scleral icterus.  Neck: Normal range of motion. Neck supple.  Cardiovascular: Regular rhythm, normal heart sounds and intact distal pulses.  Tachycardia present.   Capillary refill < 3 seconds.  Pulmonary/Chest: Effort normal and breath sounds normal. He has no decreased breath sounds.  He has no wheezes. He has no rhonchi. He has no rales.  Abdominal: Soft. Bowel sounds are normal. There is no tenderness.  Musculoskeletal: Normal range of motion. He exhibits no edema.  Bilateral wrists nontender. Lumbar spine and paralumbar muscles nontender. Tenderness to palpation of left ring finger with swelling throughout. Full range of motion, pain noted. No color change.  Neurological: He is alert and oriented to person, place, and time.  Skin: Skin is warm and dry. He is not diaphoretic.   Psychiatric: He has a normal mood and affect. His behavior is normal.    ED Course  Procedures (including critical care time) Labs Review Labs Reviewed  CBC WITH DIFFERENTIAL - Abnormal; Notable for the following:    WBC 14.8 (*)    RBC 1.82 (*)    Hemoglobin 6.1 (*)    HCT 17.9 (*)    RDW 17.2 (*)    Platelets 438 (*)    nRBC 8 (*)    Neutro Abs 7.9 (*)    Lymphs Abs 5.3 (*)    Monocytes Absolute 1.5 (*)    All other components within normal limits  COMPREHENSIVE METABOLIC PANEL - Abnormal; Notable for the following:    Total Bilirubin 1.5 (*)    All other components within normal limits  RETICULOCYTES - Abnormal; Notable for the following:    Retic Ct Pct 20.9 (*)    RBC. 1.82 (*)    Retic Count, Manual 380.4 (*)    All other components within normal limits  URINALYSIS, ROUTINE W REFLEX MICROSCOPIC  CBC  POCT I-STAT TROPONIN I  TYPE AND SCREEN   Imaging Review Dg Chest 2 View  08/07/2013   CLINICAL DATA:  Sickle cell crisis.  Pain.  EXAM: CHEST  2 VIEW  COMPARISON:  07/21/2013  FINDINGS: Low lung volumes are present, causing crowding of the pulmonary vasculature. Cardiothoracic index 55%, at least partially due to the low lung volumes, although overall mild cardiomegaly is suspected. Prior PICC line has been removed. Left proximal humeral implant.  Minimal airway thickening. No airspace opacity. Endplate findings in the thoracic spine characteristic of sickle cell disease.  IMPRESSION: 1. Mild airway thickening may reflect bronchitis or reactive airways disease. No airspace opacity is currently observed 2. Low lung volumes are present, causing crowding of the pulmonary vasculature. 3. Mild cardiomegaly.   Electronically Signed   By: Herbie Baltimore M.D.   On: 08/07/2013 19:24   Dg Finger Ring Left  08/07/2013   CLINICAL DATA:  Left ring finger injury and pain.  EXAM: LEFT RING FINGER 2+V  COMPARISON:  None.  FINDINGS: A comminuted, mildly displaced fracture is seen  involving the base of middle phalanx with intra-articular extension into the PIP joint. No evidence of dislocation.  IMPRESSION: Comminuted fracture of base of middle phalanx, with intra-articular extension.   Electronically Signed   By: Myles Rosenthal M.D.   On: 08/07/2013 19:20    EKG Interpretation    Date/Time:  Monday August 07 2013 19:35:52 EST Ventricular Rate:  92 PR Interval:  143 QRS Duration: 83 QT Interval:  358 QTC Calculation: 443 R Axis:   38 Text Interpretation:  Sinus rhythm No significant change since 05/28/2013 Confirmed by WARD  DO, KRISTEN ([id number]) on 08/07/2013 7:41:09 PM            MDM   1. Sickle cell crisis   2. Finger fracture, left, closed, initial encounter   3. Sickle cell pain crisis     Patient  presenting with sickle cell crisis, pain and bilateral wrist and lower back, left ring finger with swelling. Also with chest pain and shortness of breath. He is well appearing and in no apparent distress, tachycardic, afebrile. O2 sat 94% on room air. Labs pending- cbc, cmp, retic, ua, troponin. EKG, chest x-ray and x-ray of left ring finger. IV fluids and pain control.  X-ray showed comminuted fracture of base of middle phalanx, with intra-articular extension. Finger splint applied. Hemoglobin 6.1, near baseline for patient. Reticular ct 20.9, elevated from prior. Troponin negative. Chest x-ray showing mild airway thickening, no opacity. Mild cardiomegaly, crowding of pulmonary vasculature. Pain not improved after two rounds of pain medication, and given recent admission with transfusion, hx of reactions to transfusions and spontaneous finger fracture, patient will be admitted for sickle cell crisis. Admission accepted by Dr. Julian Reil, [facility].   Trevor Mace, PA-C 08/08/13 [phone]

## 2013-08-08 DIAGNOSIS — IMO0002 Reserved for concepts with insufficient information to code with codable children: Secondary | ICD-10-CM

## 2013-08-08 LAB — CBC
Hemoglobin: 6 g/dL — CL (ref 13.0–17.0)
Platelets: 358 10*3/uL (ref 150–400)
RDW: 17.8 % — ABNORMAL HIGH (ref 11.5–15.5)
WBC: 16.5 10*3/uL — ABNORMAL HIGH (ref 4.0–10.5)

## 2013-08-08 MED ORDER — ADULT MULTIVITAMIN W/MINERALS CH
1.0000 | ORAL_TABLET | Freq: Every day | ORAL | Status: DC
  Administered 2013-08-08 – 2013-08-28 (×21): 1 via ORAL
  Filled 2013-08-08 (×21): qty 1

## 2013-08-08 MED ORDER — ENSURE COMPLETE PO LIQD
237.0000 mL | Freq: Two times a day (BID) | ORAL | Status: DC
  Administered 2013-08-09 – 2013-08-15 (×13): 237 mL via ORAL

## 2013-08-08 MED ORDER — KETOROLAC TROMETHAMINE 30 MG/ML IJ SOLN
30.0000 mg | Freq: Three times a day (TID) | INTRAMUSCULAR | Status: AC | PRN
  Administered 2013-08-09: 30 mg via INTRAVENOUS
  Filled 2013-08-08: qty 1

## 2013-08-08 MED ORDER — HYDROMORPHONE 0.3 MG/ML IV SOLN
INTRAVENOUS | Status: DC
  Administered 2013-08-08: 10.6 mg via INTRAVENOUS
  Administered 2013-08-08: 16:00:00 8.11 mg via INTRAVENOUS
  Administered 2013-08-09 (×2): via INTRAVENOUS
  Administered 2013-08-09: 4.49 mg via INTRAVENOUS
  Administered 2013-08-09: 09:00:00 1.91 mg via INTRAVENOUS
  Administered 2013-08-09: 5.59 mg via INTRAVENOUS
  Administered 2013-08-09: 4.34 mg via INTRAVENOUS
  Administered 2013-08-09: 3.63 mg via INTRAVENOUS
  Administered 2013-08-09: 5.62 mg via INTRAVENOUS
  Administered 2013-08-10: 7.63 mg via INTRAVENOUS
  Administered 2013-08-10: 4.99 mg via INTRAVENOUS
  Administered 2013-08-10: 4 mg via INTRAVENOUS
  Administered 2013-08-10: 03:00:00 via INTRAVENOUS
  Administered 2013-08-10: 2.64 mg via INTRAVENOUS
  Administered 2013-08-10: 3.63 mg via INTRAVENOUS
  Administered 2013-08-10: 4.99 mg via INTRAVENOUS
  Administered 2013-08-10: 22:00:00 via INTRAVENOUS
  Administered 2013-08-11: 5.67 mg via INTRAVENOUS
  Administered 2013-08-11: 4 mg via INTRAVENOUS
  Administered 2013-08-11: 1.99 mg via INTRAVENOUS
  Administered 2013-08-11: 04:00:00 via INTRAVENOUS
  Administered 2013-08-11: 5.48 mg via INTRAVENOUS
  Administered 2013-08-11 – 2013-08-12 (×5): via INTRAVENOUS
  Administered 2013-08-12: 7.55 mg via INTRAVENOUS
  Administered 2013-08-12: 5.04 mg via INTRAVENOUS
  Administered 2013-08-12 – 2013-08-13 (×4): via INTRAVENOUS
  Administered 2013-08-13: 1 mg via INTRAVENOUS
  Administered 2013-08-13: 5.9 mg via INTRAVENOUS
  Administered 2013-08-13: 5.48 mg via INTRAVENOUS
  Administered 2013-08-13: 6.99 mg via INTRAVENOUS
  Administered 2013-08-13 (×3): via INTRAVENOUS
  Administered 2013-08-14: 5.51 mg via INTRAVENOUS
  Administered 2013-08-14: 5.99 mg via INTRAVENOUS
  Administered 2013-08-14: 03:00:00 via INTRAVENOUS
  Administered 2013-08-14: 5.63 mg via INTRAVENOUS
  Administered 2013-08-14: 5.46 mg via INTRAVENOUS
  Administered 2013-08-14: 19:00:00 4.48 mg via INTRAVENOUS
  Administered 2013-08-14: 4.47 mg via INTRAVENOUS
  Administered 2013-08-14 – 2013-08-15 (×2): via INTRAVENOUS
  Administered 2013-08-15: 5.39 mg via INTRAVENOUS
  Administered 2013-08-15: 9.01 mg via INTRAVENOUS
  Administered 2013-08-15: 01:00:00 via INTRAVENOUS
  Administered 2013-08-15: 4.49 mg via INTRAVENOUS
  Administered 2013-08-15: 14:00:00 via INTRAVENOUS
  Administered 2013-08-15: 6.99 mg via INTRAVENOUS
  Administered 2013-08-15 – 2013-08-16 (×3): via INTRAVENOUS
  Administered 2013-08-16: 11.52 mg via INTRAVENOUS
  Administered 2013-08-16: 10.26 mg via INTRAVENOUS
  Filled 2013-08-08 (×31): qty 25

## 2013-08-08 MED ORDER — KETOROLAC TROMETHAMINE 30 MG/ML IJ SOLN
30.0000 mg | Freq: Once | INTRAMUSCULAR | Status: AC
  Administered 2013-08-08: 05:00:00 30 mg via INTRAVENOUS
  Filled 2013-08-08: qty 1

## 2013-08-08 NOTE — Progress Notes (Signed)
[facility] PROGRESS NOTE  Dennis Bernard ZOX:[id number] DOB: [birth year] DOA: 08/07/2013  PCP: None here yet. He is originally from [place].  Brief HPI: Dennis Bernard is a 27 y.o. male with h/o sickle cell anemia, frequent admissions for crisis. Patient presents with typical low back pain symptoms from sickle cell crisis ongoing for the past 3 days. Developed chest pain and SOB. Pain was unrelieved by home meds. Additionally he had left ring finger pain and swelling which he woke up with and denies any injuries.   Past medical history:  Past Medical History  Diagnosis Date  . Sickle cell disease   . Avascular necrosis of femur head, right   . H/O allergic rhinitis   . H/O hypokalemia   . Chronic pain syndrome   . H/O wheezing     with colds  . Non-ABO incompatible blood transfusion 05/30/2013    Consultants: None  Procedures: None  Antibiotics: None  Subjective: Patient has 10/10 pain in back. Also has pain in his finger. No other complaints.  Objective: Vital Signs  Filed Vitals:   08/08/13 0424 08/08/13 0500 08/08/13 0800 08/08/13 1026  BP:  121/73    Pulse:  87    Temp:  98.8 F (37.1 C)    TempSrc:  Oral    Resp: 18 16 16 16   Height:      Weight:      SpO2: 99% 99% 99% 100%    Intake/Output Summary (Last 24 hours) at 08/08/13 1206 Last data filed at 08/08/13 8119  Gross per 24 hour  Intake      0 ml  Output    750 ml  Net   -750 ml   Filed Weights   08/07/13 1743 08/07/13 2300  Weight: 74.844 kg (165 lb) 80.1 kg (176 lb 9.4 oz)    Intake/Output from previous day: 11/24 0701 - 11/25 0700 In: -  Out: 750 [Urine:750]  General appearance: alert, cooperative, appears stated age and no distress Head: Normocephalic, without obvious abnormality, atraumatic Resp: clear to auscultation bilaterally Cardio: regular rate and rhythm, S1, S2 normal, no murmur, click, rub or gallop GI: soft, non-tender; bowel sounds normal; no masses,  no  organomegaly Extremities: extremities normal, atraumatic, no cyanosis or edema Neurologic: Alert and oriented X 3, normal strength and tone. Normal symmetric reflexes. Normal coordination and gait  Lab Results:  Basic Metabolic Panel:  Recent Labs Lab 08/07/13 2000  NA 141  K 4.0  CL 108  CO2 25  GLUCOSE 78  BUN 11  CREATININE 1.03  CALCIUM 9.0   Liver Function Tests:  Recent Labs Lab 08/07/13 2000  AST 27  ALT 13  ALKPHOS 91  BILITOT 1.5*  PROT 6.9  ALBUMIN 3.6   CBC:  Recent Labs Lab 08/07/13 2000 08/08/13 0440  WBC 14.8* 16.5*  NEUTROABS 7.9*  --   HGB 6.1* 6.0*  HCT 17.9* 18.1*  MCV 98.4 98.4  PLT 438* 358     Studies/Results: Dg Chest 2 View  08/07/2013   CLINICAL DATA:  Sickle cell crisis.  Pain.  EXAM: CHEST  2 VIEW  COMPARISON:  07/21/2013  FINDINGS: Low lung volumes are present, causing crowding of the pulmonary vasculature. Cardiothoracic index 55%, at least partially due to the low lung volumes, although overall mild cardiomegaly is suspected. Prior PICC line has been removed. Left proximal humeral implant.  Minimal airway thickening. No airspace opacity. Endplate findings in the thoracic spine characteristic of sickle cell disease.  IMPRESSION: 1. Mild airway  thickening may reflect bronchitis or reactive airways disease. No airspace opacity is currently observed 2. Low lung volumes are present, causing crowding of the pulmonary vasculature. 3. Mild cardiomegaly.   Electronically Signed   By: Herbie Baltimore M.D.   On: 08/07/2013 19:24   Dg Finger Ring Left  08/07/2013   CLINICAL DATA:  Left ring finger injury and pain.  EXAM: LEFT RING FINGER 2+V  COMPARISON:  None.  FINDINGS: A comminuted, mildly displaced fracture is seen involving the base of middle phalanx with intra-articular extension into the PIP joint. No evidence of dislocation.  IMPRESSION: Comminuted fracture of base of middle phalanx, with intra-articular extension.   Electronically  Signed   By: Myles Rosenthal M.D.   On: 08/07/2013 19:20    Medications:  Scheduled: . fluticasone  1 spray Each Nare Daily  . folic acid  1 mg Oral Daily  . heparin  5,000 Units Subcutaneous Q8H  . HYDROmorphone PCA 0.3 mg/mL   Intravenous Q4H  . morphine  60 mg Oral BID  . senna-docusate  1 tablet Oral BID   Continuous: . sodium chloride 100 mL/hr at 08/08/13 1610   RUE:[id number], diphenhydrAMINE, ketorolac, promethazine  Assessment/Plan:  Principal Problem:   Sickle cell pain crisis Active Problems:   Sickle cell anemia   Fracture of phalanx of left ring finger    Sickle cell pain and crisis Continue current management with LA morphine and Dilaudid PCA. CXR negative for findings concerning for acute chest at this time. He plans to follow up with Dr. Cyndie Chime as OP.  Fracture of phalanx of left ring finger Denies any injuries. Attempting to call Ortho. Pain control.  Code Status: Full Code  DVT Prophylaxis: Heparin    Family Communication: Discussed with patient  Disposition Plan: Not ready for discharge.    LOS: 1 day   [facility]  [facility] Pager [phone] 08/08/2013, 12:06 PM  If 8PM-8AM, please contact night-coverage at [email or web address], password [facility]

## 2013-08-08 NOTE — Consult Note (Signed)
  Patient seen and examined. Patient is currently admitted for a sickle cell crisis. He notes that his left ring finger is tender about the PIP joint. He denies obvious trauma or inciting event he is aware. However he notes that this was something that rose acutely.  He has x-rays revealing a fracture to her left ring finger proximal interphalangeal joint. This is comminuted and intra-articular.  I reviewed this at length and his findings.  Past medical and surgical history reviewed and are significant for sickle cell disease. Past medical history is noted. Allergies are noted. Medicines this hospitalization are noted. He states his had two sickle cell crises a month as of late.  He is a Consulting civil engineer at [employer].  Examination shows he has a normal HEENT exam. His abdomen is nontender. He walks with a painful date due to the sickle cell crisis. His chest is clear with equal breath sounds. Right upper extremity is neurovascular intact. Left upper extremity is reviewed. Left arm is without signs of infection dystrophy or vascular compromise. He does have swelling of the ring finger PIP joint indicative of the fracture.  His x-rays are reviewed and do show acutely displaced fracture right ring finger.  Impression acutely #1 displaced ring finger left hand PIP fracture #2 sickle cell crisis  Recommendations I would certainly recommend consideration for dynamic external fixation with early therapy for range of motion to try and prevent stiffness and give him the best hand possible. I went over the surgical algorithm of care etc.  At present time he is planning to hopefully travel to [place] is his family Thanksgiving holiday. I have recommended that he see me Monday morning in my office in [facility] orthopedics for followup and to tentatively schedule surgery for Tuesday. In the do's and don'ts risk and benefits etc. For now I do feel that he is stable to continue his dressing and splint which I discussed with  him. Also showed him how to don and doff his splint.  The present time he will see me in my office Monday at 8 AM for followup he'll continue immobilization until that time and will tentatively plan for fixation Tuesday next week. It was a pleasure seeing today.  Naftuli Dalsanto M.D.

## 2013-08-08 NOTE — Progress Notes (Signed)
INITIAL NUTRITION ASSESSMENT  DOCUMENTATION CODES Per approved criteria  -Not Applicable   INTERVENTION: Provide Ensure Complete TID Provide Multivitamin with minerals daily Encourage PO intake of 3 healthy meals daily  NUTRITION DIAGNOSIS: Inadequate oral intake related to poor appetite as evidenced by pt's report.   Goal: Pt to meet >/= 90% of their estimated nutrition needs   Monitor:  PO intake Weight Labs  Reason for Assessment: Malnutrition Screening Tool, score of 2  27 y.o. male  Admitting Dx: Sickle cell pain crisis  ASSESSMENT: 27 y.o. male with h/o sickle cell anemia, frequent admissions for crisis. Patient presents with typical low back pain symptoms from sickle cell crisis ongoing for the past 3 days. Developed chest pain and SOB.  Pt reports having a poor appetite; he did not eat breakfast this morning and has been eating poorly for the past 3 days. Pt reports usual body weight to be 170 lbs. Dicussed the importance of eating healthy; encouraged pt to eat all food groups daily, encouraged 3 balanced meals with protein-rich foods, and encouraged daily multivitamin. Pt requesting to receive Ensure supplements during admission.   Height: Ht Readings from Last 1 Encounters:  08/07/13 5\' 9"  (1.753 m)    Weight: Wt Readings from Last 1 Encounters:  08/07/13 176 lb 9.4 oz (80.1 kg)    Ideal Body Weight: 160 lbs  % Ideal Body Weight: 110%  Wt Readings from Last 10 Encounters:  08/07/13 176 lb 9.4 oz (80.1 kg)  07/29/13 184 lb 8 oz (83.689 kg)  05/28/13 168 lb 14 oz (76.6 kg)  05/14/13 170 lb (77.111 kg)  03/27/13 175 lb 12.8 oz (79.742 kg)  02/20/13 174 lb 9.6 oz (79.198 kg)  02/14/13 172 lb (78.019 kg)  02/13/13 179 lb (81.194 kg)  01/19/13 170 lb (77.111 kg)  01/14/13 179 lb 6.4 oz (81.375 kg)    Usual Body Weight: 170 lbs  % Usual Body Weight: 104%  BMI:  Body mass index is 26.07 kg/(m^2).  Estimated Nutritional Needs: Kcal:  2000-2200 Protein: 100-110 grams Fluid: 2-2.2 L/day  Skin: non-pitting RUE, RLE, and LLE edema  Diet Order: General  EDUCATION NEEDS: -No education needs identified at this time   Intake/Output Summary (Last 24 hours) at 08/08/13 1420 Last data filed at 08/08/13 1336  Gross per 24 hour  Intake      0 ml  Output   1650 ml  Net  -1650 ml    Last BM: 11/24   Labs:   Recent Labs Lab 08/07/13 2000  NA 141  K 4.0  CL 108  CO2 25  BUN 11  CREATININE 1.03  CALCIUM 9.0  GLUCOSE 78    CBG (last 3)  No results found for this basename: GLUCAP,  in the last 72 hours  Scheduled Meds: . fluticasone  1 spray Each Nare Daily  . folic acid  1 mg Oral Daily  . heparin  5,000 Units Subcutaneous Q8H  . HYDROmorphone PCA 0.3 mg/mL   Intravenous Q4H  . morphine  60 mg Oral BID  . senna-docusate  1 tablet Oral BID    Continuous Infusions: . sodium chloride 100 mL/hr at 08/08/13 2841    Past Medical History  Diagnosis Date  . Sickle cell disease   . Avascular necrosis of femur head, right   . H/O allergic rhinitis   . H/O hypokalemia   . Chronic pain syndrome   . H/O wheezing     with colds  . Non-ABO incompatible blood  transfusion 05/30/2013    Past Surgical History  Procedure Laterality Date  . Cholecystectomy  1995  . Portacath placement Left 07/04/2012  . Exchange transfusion  02/2008    perioperatively for shoulder surgery  . 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

## 2013-08-08 NOTE — Plan of Care (Signed)
Problem: Phase I Progression Outcomes Goal: Pt. aware of pain management plan Outcome: Progressing Pt started on Dilaudid pca. Stated that he had had it before and understood how to use it.

## 2013-08-08 NOTE — Progress Notes (Signed)
Pt states that his pca is not working. Enc to continue to use it and this RN would notify the NP on call regarding his pain med. Orders received for Toradol. Will cont to monitor.

## 2013-08-08 NOTE — Plan of Care (Signed)
Problem: Phase I Progression Outcomes Goal: Bowel Movement At Least Every 3 Days Outcome: Progressing Had bowel movement on 11/24, was started on Senokot tonight.

## 2013-08-09 DIAGNOSIS — R509 Fever, unspecified: Secondary | ICD-10-CM

## 2013-08-09 DIAGNOSIS — D72829 Elevated white blood cell count, unspecified: Secondary | ICD-10-CM

## 2013-08-09 LAB — CBC
HCT: 16.5 % — ABNORMAL LOW (ref 39.0–52.0)
Hemoglobin: 5.6 g/dL — CL (ref 13.0–17.0)
MCH: 32.9 pg (ref 26.0–34.0)
MCHC: 33.9 g/dL (ref 30.0–36.0)
MCV: 97.1 fL (ref 78.0–100.0)
Platelets: 349 10*3/uL (ref 150–400)
RBC: 1.7 MIL/uL — ABNORMAL LOW (ref 4.22–5.81)
RDW: 17.1 % — ABNORMAL HIGH (ref 11.5–15.5)
WBC: 18.4 10*3/uL — ABNORMAL HIGH (ref 4.0–10.5)

## 2013-08-09 LAB — BASIC METABOLIC PANEL
CO2: 21 mEq/L (ref 19–32)
GFR calc non Af Amer: >90 mL/min (ref >90)
Glucose, Bld: 122 mg/dL — ABNORMAL HIGH (ref 70–99)
Potassium: 3.9 mEq/L (ref 3.5–5.1)
Sodium: 139 mEq/L (ref 135–145)

## 2013-08-09 LAB — PREPARE RBC (CROSSMATCH)

## 2013-08-09 MED ORDER — ACETAMINOPHEN 325 MG PO TABS
650.0000 mg | ORAL_TABLET | Freq: Four times a day (QID) | ORAL | Status: DC | PRN
  Administered 2013-08-09 – 2013-08-18 (×5): 650 mg via ORAL
  Filled 2013-08-09 (×6): qty 2

## 2013-08-09 MED ORDER — DIPHENHYDRAMINE HCL 50 MG/ML IJ SOLN
25.0000 mg | Freq: Once | INTRAMUSCULAR | Status: AC
  Administered 2013-08-09: 21:00:00 25 mg via INTRAVENOUS
  Filled 2013-08-09: qty 1

## 2013-08-09 NOTE — Progress Notes (Signed)
[facility] PROGRESS NOTE  Dennis Bernard WGN:[id number] DOB: 02-09-1986 DOA: 08/07/2013  PCP: None here yet. He is originally from [place].  Brief narrative: Dennis Bernard is a 27 y.o. male with h/o sickle cell anemia, frequent admissions for crisis. Patient presents with typical low back pain symptoms from sickle cell crisis ongoing for the past 3 days. Developed chest pain and SOB. Pain was unrelieved by home meds. Additionally he had left ring finger pain and swelling which he woke up with and denies any injuries.   Assessment/Plan: Sickle cell pain and crisis Continue current management with LA morphine and Dilaudid PCA. CXR negative for findings concerning for acute chest at this time. -Hemoglobin down to 5.6 this a.m. ( the patient states his last hemoglobin at discharge earlier this November was 6.5) -will transfuse 1unit PRBC -She is to follow up outpatient with Dr. Cyndie Chime Fracture of phalanx of left ring finger Continue immobilization and Pain control. -Appreciate orthopedic/hand surgery input-patient to followup outpatient is Dr. Amanda Pea  Fever/Leukocytosis -Likely reactive secondary to #1 -chest x-ray with no airspace disease, urinalysis negative for -Will obtain blood cultures and follow.  Code Status: Full Code  DVT Prophylaxis: Heparin    Family Communication: Discussed with patient  Disposition Plan: Not ready for discharge.  Consultants: None  Procedures: None  Antibiotics: None  Subjective: C/o pain 8/10 in back  Objective: Vital Signs  Filed Vitals:   08/09/13 0200 08/09/13 0418 08/09/13 0541 08/09/13 0900  BP: 115/61  93/46   Pulse: 114  87   Temp: 101.8 F (38.8 C)  98.3 F (36.8 C)   TempSrc: Oral  Oral   Resp: 13 13 16 13   Height:      Weight:      SpO2:  98% 100% 96%    Intake/Output Summary (Last 24 hours) at 08/09/13 1128 Last data filed at 08/09/13 0047  Gross per 24 hour  Intake   1200 ml  Output   2800 ml  Net   -1600 ml   Filed Weights   08/07/13 1743 08/07/13 2300  Weight: 74.844 kg (165 lb) 80.1 kg (176 lb 9.4 oz)    Intake/Output from previous day: 11/25 0701 - 11/26 0700 In: 1200 [I.V.:1200] Out: 2800 [Urine:2800]  Exam:  General: alert & oriented x 3 In NAD Cardiovascular: RRR, nl S1 s2 Respiratory: CTAB, decreased breath sounds at the bases Abdomen: soft +BS NT/ND, no masses palpable Extremities: No cyanosis and no edema, L. Ring finger immobilized, no cyanosis and no edema   Lab Results:  Basic Metabolic Panel:  Recent Labs Lab 08/07/13 2000 08/09/13 0340  NA 141 139  K 4.0 3.9  CL 108 107  CO2 25 21  GLUCOSE 78 122*  BUN 11 10  CREATININE 1.03 0.93  CALCIUM 9.0 8.3*   Liver Function Tests:  Recent Labs Lab 08/07/13 2000  AST 27  ALT 13  ALKPHOS 91  BILITOT 1.5*  PROT 6.9  ALBUMIN 3.6   CBC:  Recent Labs Lab 08/07/13 2000 08/08/13 0440 08/09/13 0340  WBC 14.8* 16.5* 18.4*  NEUTROABS 7.9*  --   --   HGB 6.1* 6.0* 5.6*  HCT 17.9* 18.1* 16.5*  MCV 98.4 98.4 97.1  PLT 438* 358 349     Studies/Results: Dg Chest 2 View  08/07/2013   CLINICAL DATA:  Sickle cell crisis.  Pain.  EXAM: CHEST  2 VIEW  COMPARISON:  07/21/2013  FINDINGS: Low lung volumes are present, causing crowding of the pulmonary vasculature. Cardiothoracic index  55%, at least partially due to the low lung volumes, although overall mild cardiomegaly is suspected. Prior PICC line has been removed. Left proximal humeral implant.  Minimal airway thickening. No airspace opacity. Endplate findings in the thoracic spine characteristic of sickle cell disease.  IMPRESSION: 1. Mild airway thickening may reflect bronchitis or reactive airways disease. No airspace opacity is currently observed 2. Low lung volumes are present, causing crowding of the pulmonary vasculature. 3. Mild cardiomegaly.   Electronically Signed   By: Herbie Baltimore M.D.   On: 08/07/2013 19:24   Dg Finger Ring  Left  08/07/2013   CLINICAL DATA:  Left ring finger injury and pain.  EXAM: LEFT RING FINGER 2+V  COMPARISON:  None.  FINDINGS: A comminuted, mildly displaced fracture is seen involving the base of middle phalanx with intra-articular extension into the PIP joint. No evidence of dislocation.  IMPRESSION: Comminuted fracture of base of middle phalanx, with intra-articular extension.   Electronically Signed   By: Myles Rosenthal M.D.   On: 08/07/2013 19:20    Medications:  Scheduled: . feeding supplement (ENSURE COMPLETE)  237 mL Oral BID PC  . fluticasone  1 spray Each Nare Daily  . folic acid  1 mg Oral Daily  . heparin  5,000 Units Subcutaneous Q8H  . HYDROmorphone PCA 0.3 mg/mL   Intravenous Q4H  . morphine  60 mg Oral BID  . multivitamin with minerals  1 tablet Oral Daily  . senna-docusate  1 tablet Oral BID   Continuous: . sodium chloride 100 mL/hr at 08/09/13 9811   BJY:[id number], albuterol, diphenhydrAMINE, ketorolac, promethazine    Principal Problem:   Sickle cell pain crisis Active Problems:   Sickle cell anemia   Fracture of phalanx of left ring finger        LOS: 2 days   [name]  [facility] Pager [phone] 08/09/2013, 11:28 AM  If 8PM-8AM, please contact night-coverage at [email or web address], password [facility]

## 2013-08-09 NOTE — ED Provider Notes (Signed)
Medical screening examination/treatment/procedure(s) were performed by non-physician practitioner and as supervising physician I was immediately available for consultation/collaboration.  EKG Interpretation    Date/Time:  Monday August 07 2013 19:35:52 EST Ventricular Rate:  92 PR Interval:  143 QRS Duration: 83 QT Interval:  358 QTC Calculation: 443 R Axis:   38 Text Interpretation:  Sinus rhythm No significant change since 05/28/2013 Confirmed by Kaslyn Richburg  DO, Annastyn Silvey ([id number]) on 08/07/2013 7:41:09 PM              Layla Maw Atsushi Yom, DO 08/09/13 2130

## 2013-08-10 DIAGNOSIS — K5900 Constipation, unspecified: Secondary | ICD-10-CM

## 2013-08-10 LAB — BASIC METABOLIC PANEL
BUN: 15 mg/dL (ref 6–23)
Chloride: 106 mEq/L (ref 96–112)
GFR calc Af Amer: >90 mL/min (ref >90)
GFR calc non Af Amer: >90 mL/min (ref >90)
Potassium: 4.7 mEq/L (ref 3.5–5.1)
Sodium: 136 mEq/L (ref 135–145)

## 2013-08-10 LAB — TYPE AND SCREEN
ABO/RH(D): AB POS
Unit division: 0

## 2013-08-10 LAB — CBC
HCT: 19.6 % — ABNORMAL LOW (ref 39.0–52.0)
MCHC: 34.2 g/dL (ref 30.0–36.0)
Platelets: 321 10*3/uL (ref 150–400)
RBC: 2.08 MIL/uL — ABNORMAL LOW (ref 4.22–5.81)
RDW: 16.1 % — ABNORMAL HIGH (ref 11.5–15.5)
WBC: 12.3 10*3/uL — ABNORMAL HIGH (ref 4.0–10.5)

## 2013-08-10 MED ORDER — OXYCODONE HCL 5 MG PO TABS
5.0000 mg | ORAL_TABLET | ORAL | Status: DC | PRN
  Administered 2013-08-10 – 2013-08-11 (×5): 5 mg via ORAL
  Filled 2013-08-10 (×5): qty 1

## 2013-08-10 MED ORDER — PANTOPRAZOLE SODIUM 40 MG PO TBEC
40.0000 mg | DELAYED_RELEASE_TABLET | Freq: Every day | ORAL | Status: DC
  Administered 2013-08-10 – 2013-08-28 (×19): 40 mg via ORAL
  Filled 2013-08-10 (×19): qty 1

## 2013-08-10 MED ORDER — NAPROXEN 500 MG PO TABS
500.0000 mg | ORAL_TABLET | Freq: Two times a day (BID) | ORAL | Status: DC
  Administered 2013-08-10 – 2013-08-28 (×35): 500 mg via ORAL
  Filled 2013-08-10 (×41): qty 1

## 2013-08-10 MED ORDER — HYDROXYZINE HCL 25 MG PO TABS
25.0000 mg | ORAL_TABLET | Freq: Three times a day (TID) | ORAL | Status: DC | PRN
  Filled 2013-08-10: qty 2

## 2013-08-10 NOTE — Progress Notes (Signed)
[facility] PROGRESS NOTE  Erskin Zinda WUJ:[id number] DOB: 1986-02-16 DOA: 08/07/2013  PCP: None here yet. He is originally from [place].  Brief narrative: Dennis Bernard is a 27 y.o. male with h/o sickle cell anemia, frequent admissions for crisis. Patient presents with typical low back pain symptoms from sickle cell crisis ongoing for the past 3 days. Developed chest pain and SOB. Pain was unrelieved by home meds. Additionally he had left ring finger pain and swelling which he woke up with and denies any injuries.   Assessment/Plan: Sickle cell pain and crisis Continue current management with LA morphine and Dilaudid PCA. CXR negative for findings concerning for acute chest at this time. -Hemoglobin improved to 6.7 status post transfusion of one unit of packed red blood cells (from 5.6 on 11/26). the patient states his last hemoglobin at discharge earlier this November was 6.5 -he is to follow up outpatient with Dr. Cyndie Chime -Will add scheduled Naprosyn and oxycodone when necessary to improve -Will DC Benadryl>> trial of Atarax and follow  Fracture of phalanx of left ring finger Continue immobilization and Pain control. -Appreciate orthopedic/hand surgery input-patient to followup outpatient is Dr. Amanda Pea  Fever/Leukocytosis -Likely reactive secondary to #1-ABC trending down today( on no abx) and he has defervesced -chest x-ray with no airspace disease, urinalysis negative for - blood cultures no growth to date  Code Status: Full Code  DVT Prophylaxis: Heparin    Family Communication: Discussed with patient  Disposition Plan: Not ready for discharge.  Consultants: None  Procedures: None  Antibiotics: None  Subjective: Still with complaints of increased pain, also complaining of itching  Objective: Vital Signs  Filed Vitals:   08/10/13 0424 08/10/13 0512 08/10/13 0943 08/10/13 1318  BP:  110/63    Pulse:  81    Temp:  98.6 F (37 C)    TempSrc:   Oral    Resp: 16 18 16 16   Height:      Weight:      SpO2: 100% 100% 100% 100%    Intake/Output Summary (Last 24 hours) at 08/10/13 1408 Last data filed at 08/10/13 1100  Gross per 24 hour  Intake 3962.92 ml  Output   4300 ml  Net -337.08 ml   Filed Weights   08/07/13 1743 08/07/13 2300  Weight: 74.844 kg (165 lb) 80.1 kg (176 lb 9.4 oz)    Intake/Output from previous day: 11/26 0701 - 11/27 0700 In: 4202.9 [P.O.:480; I.V.:3500; Blood:222.9] Out: 4300 [Urine:4300]  Exam:  General: alert & oriented x 3 In NAD Cardiovascular: RRR, nl S1 s2 Respiratory: CTAB, decreased breath sounds at the bases Abdomen: soft +BS NT/ND, no masses palpable Extremities: No cyanosis and no edema, L. Ring finger immobilized, no cyanosis and no edema   Lab Results:  Basic Metabolic Panel:  Recent Labs Lab 08/07/13 2000 08/09/13 0340 08/10/13 0550  NA 141 139 136  K 4.0 3.9 4.7  CL 108 107 106  CO2 25 21 19   GLUCOSE 78 122* 96  BUN 11 10 15   CREATININE 1.03 0.93 0.66  CALCIUM 9.0 8.3* 9.0   Liver Function Tests:  Recent Labs Lab 08/07/13 2000  AST 27  ALT 13  ALKPHOS 91  BILITOT 1.5*  PROT 6.9  ALBUMIN 3.6   CBC:  Recent Labs Lab 08/07/13 2000 08/08/13 0440 08/09/13 0340 08/10/13 0550  WBC 14.8* 16.5* 18.4* 12.3*  NEUTROABS 7.9*  --   --   --   HGB 6.1* 6.0* 5.6* 6.7*  HCT 17.9* 18.1* 16.5*  19.6*  MCV 98.4 98.4 97.1 94.2  PLT 438* 358 349 321     Studies/Results: No results found.  Medications:  Scheduled: . feeding supplement (ENSURE COMPLETE)  237 mL Oral BID PC  . fluticasone  1 spray Each Nare Daily  . folic acid  1 mg Oral Daily  . heparin  5,000 Units Subcutaneous Q8H  . HYDROmorphone PCA 0.3 mg/mL   Intravenous Q4H  . morphine  60 mg Oral BID  . multivitamin with minerals  1 tablet Oral Daily  . naproxen  500 mg Oral BID WC  . pantoprazole  40 mg Oral Daily  . senna-docusate  1 tablet Oral BID   Continuous: . sodium chloride 100 mL/hr at  08/10/13 8657   QIO:[id number], albuterol, diphenhydrAMINE, oxyCODONE, promethazine    Principal Problem:   Sickle cell pain crisis Active Problems:   Sickle cell anemia   Fracture of phalanx of left ring finger        LOS: 3 days   [name]  [facility] Pager [phone] 08/10/2013, 2:08 PM  If 8PM-8AM, please contact night-coverage at [email or web address], password [facility]

## 2013-08-11 LAB — CBC
HCT: 19.8 % — ABNORMAL LOW (ref 39.0–52.0)
Hemoglobin: 6.6 g/dL — CL (ref 13.0–17.0)
MCHC: 33.3 g/dL (ref 30.0–36.0)
Platelets: 297 10*3/uL (ref 150–400)
RBC: 2.07 MIL/uL — ABNORMAL LOW (ref 4.22–5.81)
WBC: 10.2 10*3/uL (ref 4.0–10.5)

## 2013-08-11 LAB — BASIC METABOLIC PANEL
BUN: 17 mg/dL (ref 6–23)
Chloride: 103 mEq/L (ref 96–112)
GFR calc non Af Amer: >90 mL/min (ref >90)
Glucose, Bld: 90 mg/dL (ref 70–99)
Potassium: 4.6 mEq/L (ref 3.5–5.1)
Sodium: 135 mEq/L (ref 135–145)

## 2013-08-11 MED ORDER — DIPHENHYDRAMINE HCL 25 MG PO CAPS
25.0000 mg | ORAL_CAPSULE | Freq: Three times a day (TID) | ORAL | Status: DC | PRN
  Administered 2013-08-11 – 2013-08-13 (×4): 50 mg via ORAL
  Filled 2013-08-11 (×4): qty 2

## 2013-08-11 MED ORDER — OXYCODONE HCL 5 MG PO TABS
5.0000 mg | ORAL_TABLET | ORAL | Status: DC | PRN
  Administered 2013-08-11 – 2013-08-25 (×56): 10 mg via ORAL
  Filled 2013-08-11 (×57): qty 2

## 2013-08-11 MED ORDER — DIPHENHYDRAMINE HCL 50 MG/ML IJ SOLN
12.5000 mg | Freq: Once | INTRAMUSCULAR | Status: AC
  Administered 2013-08-11: 12.5 mg via INTRAVENOUS
  Filled 2013-08-11: qty 1

## 2013-08-11 NOTE — Progress Notes (Signed)
[facility] PROGRESS NOTE  Dennis Bernard ZOX:[id number] DOB: 16-Dec-1985 DOA: 08/07/2013  PCP: None here yet. He is originally from [place].  Brief narrative: Dennis Bernard is a 27 y.o. male with h/o sickle cell anemia, frequent admissions for crisis. Patient presents with typical low back pain symptoms from sickle cell crisis ongoing for the past 3 days. Developed chest pain and SOB. Pain was unrelieved by home meds. Additionally he had left ring finger pain and swelling which he woke up with and denies any injuries.   Assessment/Plan: Sickle cell pain and crisis Continue current management with LA morphine and Dilaudid PCA. CXR negative for findings concerning for acute chest at this time. -Hemoglobin stable at 6.6 status post transfusion of one unit of packed red blood cells11/26 (from 5.6 on 11/26). the patient states his last hemoglobin at discharge earlier this November was 6.5 -he is to follow up outpatient with Dr. Cyndie Chime -continue scheduled Naprosyn and oxycodone when necessary to improve -Will switch back to Benadryl today as per patient's preference  Fracture of phalanx of left ring finger Continue immobilization and Pain control. -Appreciate orthopedic/hand surgery input-patient to followup outpatient is Dr. Amanda Pea  Fever/Leukocytosis -Likely reactive secondary to #1-WBC normalized-10.2 today ( on no abx) and he remained afebrile -chest x-ray with no airspace disease, urinalysis negative for - blood cultures no growth to date  Code Status: Full Code  DVT Prophylaxis: Heparin    Family Communication: Discussed with patient  Disposition Plan: Not ready for discharge.  Consultants: None  Procedures: None  Antibiotics: None  Subjective: Still with complaints of increased pain, also complaining of itching  Objective: Vital Signs  Filed Vitals:   08/10/13 2048 08/11/13 0034 08/11/13 0337 08/11/13 0504  BP: 105/59   100/61  Pulse: 90   87  Temp:  98.8 F (37.1 C)   98.7 F (37.1 C)  TempSrc: Oral   Oral  Resp: 14 12 13 12   Height:      Weight:      SpO2: 100% 100% 100% 100%    Intake/Output Summary (Last 24 hours) at 08/11/13 1037 Last data filed at 08/11/13 0600  Gross per 24 hour  Intake   3000 ml  Output   4250 ml  Net  -1250 ml   Filed Weights   08/07/13 1743 08/07/13 2300  Weight: 74.844 kg (165 lb) 80.1 kg (176 lb 9.4 oz)    Intake/Output from previous day: 11/27 0701 - 11/28 0700 In: 3000 [P.O.:600; I.V.:2400] Out: 4250 [Urine:4250]  Exam:  General: alert & oriented x 3 In NAD Cardiovascular: RRR, nl S1 s2 Respiratory: CTAB, decreased breath sounds at the bases Abdomen: soft +BS NT/ND, no masses palpable Extremities: No cyanosis and no edema, L. Ring finger immobilized, no cyanosis and no edema   Lab Results:  Basic Metabolic Panel:  Recent Labs Lab 08/07/13 2000 08/09/13 0340 08/10/13 0550 08/11/13 0448  NA 141 139 136 135  K 4.0 3.9 4.7 4.6  CL 108 107 106 103  CO2 25 21 19 24   GLUCOSE 78 122* 96 90  BUN 11 10 15 17   CREATININE 1.03 0.93 0.66 0.65  CALCIUM 9.0 8.3* 9.0 9.0   Liver Function Tests:  Recent Labs Lab 08/07/13 2000  AST 27  ALT 13  ALKPHOS 91  BILITOT 1.5*  PROT 6.9  ALBUMIN 3.6   CBC:  Recent Labs Lab 08/07/13 2000 08/08/13 0440 08/09/13 0340 08/10/13 0550 08/11/13 0448  WBC 14.8* 16.5* 18.4* 12.3* 10.2  NEUTROABS 7.9*  --   --   --   --  HGB 6.1* 6.0* 5.6* 6.7* 6.6*  HCT 17.9* 18.1* 16.5* 19.6* 19.8*  MCV 98.4 98.4 97.1 94.2 95.7  PLT 438* 358 349 321 297     Studies/Results: No results found.  Medications:  Scheduled: . feeding supplement (ENSURE COMPLETE)  237 mL Oral BID PC  . fluticasone  1 spray Each Nare Daily  . folic acid  1 mg Oral Daily  . heparin  5,000 Units Subcutaneous Q8H  . HYDROmorphone PCA 0.3 mg/mL   Intravenous Q4H  . morphine  60 mg Oral BID  . multivitamin with minerals  1 tablet Oral Daily  . naproxen  500 mg Oral BID  WC  . pantoprazole  40 mg Oral Daily  . senna-docusate  1 tablet Oral BID   Continuous: . sodium chloride 100 mL/hr at 08/11/13 0865   HQI:[id number], albuterol, hydrOXYzine, oxyCODONE, promethazine    Principal Problem:   Sickle cell pain crisis Active Problems:   Sickle cell anemia   Fracture of phalanx of left ring finger        LOS: 4 days   [name]  [facility] Pager [phone] 08/11/2013, 10:37 AM  If 8PM-8AM, please contact night-coverage at [email or web address], password [facility]

## 2013-08-12 DIAGNOSIS — D571 Sickle-cell disease without crisis: Secondary | ICD-10-CM

## 2013-08-12 LAB — COMPREHENSIVE METABOLIC PANEL
ALT: 13 U/L (ref 0–53)
Albumin: 3.5 g/dL (ref 3.5–5.2)
Alkaline Phosphatase: 112 U/L (ref 39–117)
Calcium: 9.1 mg/dL (ref 8.4–10.5)
Chloride: 106 mEq/L (ref 96–112)
GFR calc Af Amer: >90 mL/min (ref >90)
Glucose, Bld: 88 mg/dL (ref 70–99)
Potassium: 5.1 mEq/L (ref 3.5–5.1)
Sodium: 139 mEq/L (ref 135–145)
Total Bilirubin: 1.9 mg/dL — ABNORMAL HIGH (ref 0.3–1.2)
Total Protein: 7.1 g/dL (ref 6.0–8.3)

## 2013-08-12 LAB — CBC
HCT: 17 % — ABNORMAL LOW (ref 39.0–52.0)
Hemoglobin: 5.7 g/dL — CL (ref 13.0–17.0)
MCH: 31.8 pg (ref 26.0–34.0)
MCHC: 33.5 g/dL (ref 30.0–36.0)
RBC: 1.79 MIL/uL — ABNORMAL LOW (ref 4.22–5.81)
WBC: 15.3 10*3/uL — ABNORMAL HIGH (ref 4.0–10.5)

## 2013-08-12 LAB — RETICULOCYTES
RBC.: 1.79 MIL/uL — ABNORMAL LOW (ref 4.22–5.81)
Retic Count, Absolute: 80.6 10*3/uL (ref 19.0–186.0)
Retic Ct Pct: 4.5 % — ABNORMAL HIGH (ref 0.4–3.1)

## 2013-08-12 MED ORDER — HYDROMORPHONE HCL PF 1 MG/ML IJ SOLN
1.0000 mg | Freq: Once | INTRAMUSCULAR | Status: AC
  Administered 2013-08-12: 21:00:00 1 mg via INTRAMUSCULAR
  Filled 2013-08-12: qty 1

## 2013-08-12 MED ORDER — HYDROMORPHONE HCL PF 1 MG/ML IJ SOLN
1.0000 mg | Freq: Once | INTRAMUSCULAR | Status: AC
Start: 2013-08-12 — End: 2013-08-12
  Administered 2013-08-12: 23:00:00 1 mg via INTRAVENOUS
  Filled 2013-08-12: qty 1

## 2013-08-12 NOTE — Progress Notes (Signed)
[facility] PROGRESS NOTE  Dennis Bernard ZOX:[id number] DOB: 03-18-1986 DOA: 08/07/2013  PCP: None here yet. He is originally from [place].  Brief narrative: Dennis Bernard is a 27 y.o. male with h/o sickle cell anemia, frequent admissions for crisis. Patient presents with typical low back pain symptoms from sickle cell crisis ongoing for the past 3 days. Developed chest pain and SOB. Pain was unrelieved by home meds. Additionally he had left ring finger pain and swelling which he woke up with and denies any injuries.   Assessment/Plan: Sickle cell pain and crisis Continue current management with LA morphine and Dilaudid PCA. CXR negative for findings concerning for acute chest at this time. -Hemoglobin stable at 6.6 status post transfusion of one unit of packed red blood cells11/26 (from 5.6 on 11/26). the patient states his last hemoglobin at discharge earlier this November was 6.5 -he is to follow up outpatient with Dr. Cyndie Chime -continue scheduled Naprosyn and oxycodone when necessary to improve -Continue Benadryl when necessary  Fracture of phalanx of left ring finger Continue immobilization and Pain control. -Appreciate orthopedic/hand surgery input-patient to followup outpatient is Dr. Amanda Pea  Fever/Leukocytosis -Likely reactive secondary to #1-WBC decreased ( on no abx) and he remained afebrile -chest x-ray with no airspace disease, urinalysis negative for - blood cultures no growth to date  Code Status: Full Code  DVT Prophylaxis: Heparin    Family Communication: Discussed with patient  Disposition Plan: Not ready for discharge.  Consultants: None  Procedures: None  Antibiotics: None  Subjective: No new complaint, still with back pain  Objective: Vital Signs  Filed Vitals:   08/11/13 2340 08/12/13 0342 08/12/13 0514 08/12/13 0926  BP:   101/54   Pulse:   103   Temp:   98.6 F (37 C)   TempSrc:   Oral   Resp: 14 18 14 18   Height:       Weight:      SpO2: 100% 100% 100% 100%    Intake/Output Summary (Last 24 hours) at 08/12/13 1307 Last data filed at 08/12/13 1039  Gross per 24 hour  Intake   1200 ml  Output   2605 ml  Net  -1405 ml   Filed Weights   08/07/13 1743 08/07/13 2300  Weight: 74.844 kg (165 lb) 80.1 kg (176 lb 9.4 oz)    Intake/Output from previous day: 11/28 0701 - 11/29 0700 In: 1320 [P.O.:120; I.V.:1200] Out: 2025 [Urine:2025]  Exam:  General: alert & oriented x 3 In NAD Cardiovascular: RRR, nl S1 s2 Respiratory: CTAB, decreased breath sounds at the bases Abdomen: soft +BS NT/ND, no masses palpable Extremities: No cyanosis and no edema, L. Ring finger immobilized, no cyanosis and no edema   Lab Results:  Basic Metabolic Panel:  Recent Labs Lab 08/07/13 2000 08/09/13 0340 08/10/13 0550 08/11/13 0448 08/12/13 0350  NA 141 139 136 135 139  K 4.0 3.9 4.7 4.6 5.1  CL 108 107 106 103 106  CO2 25 21 19 24 21   GLUCOSE 78 122* 96 90 88  BUN 11 10 15 17 15   CREATININE 1.03 0.93 0.66 0.65 0.70  CALCIUM 9.0 8.3* 9.0 9.0 9.1   Liver Function Tests:  Recent Labs Lab 08/07/13 2000 08/12/13 0350  AST 27 31  ALT 13 13  ALKPHOS 91 112  BILITOT 1.5* 1.9*  PROT 6.9 7.1  ALBUMIN 3.6 3.5   CBC:  Recent Labs Lab 08/07/13 2000 08/08/13 0440 08/09/13 0340 08/10/13 0550 08/11/13 0448 08/12/13 0350  WBC 14.8* 16.5*  18.4* 12.3* 10.2 15.3*  NEUTROABS 7.9*  --   --   --   --   --   HGB 6.1* 6.0* 5.6* 6.7* 6.6* 5.7*  HCT 17.9* 18.1* 16.5* 19.6* 19.8* 17.0*  MCV 98.4 98.4 97.1 94.2 95.7 95.0  PLT 438* 358 349 321 297 308     Studies/Results: No results found.  Medications:  Scheduled: . feeding supplement (ENSURE COMPLETE)  237 mL Oral BID PC  . fluticasone  1 spray Each Nare Daily  . folic acid  1 mg Oral Daily  . heparin  5,000 Units Subcutaneous Q8H  . HYDROmorphone PCA 0.3 mg/mL   Intravenous Q4H  . morphine  60 mg Oral BID  . multivitamin with minerals  1 tablet Oral  Daily  . naproxen  500 mg Oral BID WC  . pantoprazole  40 mg Oral Daily  . senna-docusate  1 tablet Oral BID   Continuous: . sodium chloride 100 mL/hr at 08/12/13 0436   YNW:[id number], albuterol, diphenhydrAMINE, oxyCODONE, promethazine    Principal Problem:   Sickle cell pain crisis Active Problems:   Sickle cell anemia   Fracture of phalanx of left ring finger        LOS: 5 days   [name]  [facility] Pager [phone] 08/12/2013, 1:07 PM  If 8PM-8AM, please contact night-coverage at [email or web address], password [facility]

## 2013-08-13 ENCOUNTER — Inpatient Hospital Stay (HOSPITAL_COMMUNITY): Payer: Medicare Other

## 2013-08-13 LAB — HEMOGLOBIN AND HEMATOCRIT, BLOOD
HCT: 16.5 % — ABNORMAL LOW (ref 39.0–52.0)
Hemoglobin: 5.7 g/dL — CL (ref 13.0–17.0)

## 2013-08-13 MED ORDER — DIPHENHYDRAMINE HCL 25 MG PO CAPS
25.0000 mg | ORAL_CAPSULE | Freq: Once | ORAL | Status: AC
  Administered 2013-08-15: 22:00:00 25 mg via ORAL
  Filled 2013-08-13: qty 1

## 2013-08-13 MED ORDER — ACETAMINOPHEN 325 MG PO TABS
650.0000 mg | ORAL_TABLET | Freq: Once | ORAL | Status: AC
  Administered 2013-08-13: 10:00:00 650 mg via ORAL

## 2013-08-13 MED ORDER — ACETAMINOPHEN 325 MG PO TABS
650.0000 mg | ORAL_TABLET | Freq: Once | ORAL | Status: AC
  Administered 2013-08-15: 21:00:00 650 mg via ORAL
  Filled 2013-08-13: qty 2

## 2013-08-13 MED ORDER — SODIUM CHLORIDE 0.9 % IJ SOLN
10.0000 mL | Freq: Two times a day (BID) | INTRAMUSCULAR | Status: DC
  Administered 2013-08-14 – 2013-08-24 (×4): 10 mL

## 2013-08-13 MED ORDER — DIPHENHYDRAMINE HCL 25 MG PO CAPS: 25.0000 mg | ORAL_CAPSULE | Freq: Once | ORAL | Status: DC

## 2013-08-13 MED ORDER — SODIUM CHLORIDE 0.9 % IJ SOLN
10.0000 mL | INTRAMUSCULAR | Status: DC | PRN
  Administered 2013-08-13 – 2013-08-16 (×3): 10 mL
  Administered 2013-08-17: 10:00:00 20 mL
  Administered 2013-08-18 – 2013-08-19 (×3): 10 mL
  Administered 2013-08-20: 09:00:00 20 mL
  Administered 2013-08-20 – 2013-08-26 (×10): 10 mL

## 2013-08-13 NOTE — Progress Notes (Signed)
Patient ID: Dennis Bernard, male   DOB: [birth year], 27 y.o.   MRN: [id number] Patient is seen at bedside.  He is still having great difficulty with his sickle cell crisis.  He has no evidence of infection in his hands or fingers.  His left ring finger still has mild swelling. He has a little bit of motion and is out of his brace/splint at present time.  Discussed with the patient external fixation and early motion to be performed Tuesday. I will go ahead and re\re x-ray him today to look for counts formation. We did not expect a crisis to be so severe.  I discussed with patient I would recommend repeat x-ray and all discussed the x-ray findings with him at bedside tomorrow. We will make a definitive decision regarding surgical external fixation and early motion thereafter.  He understands this and agrees with the plan of care.  Yaneth Fairbairn MD

## 2013-08-13 NOTE — Progress Notes (Signed)
[facility] PROGRESS NOTE  Dennis Bernard HQI:[id number] DOB: February 26, 1986 DOA: 08/07/2013  PCP: None here yet. He is originally from [place].  Brief narrative: Dennis Bernard is a 27 y.o. male with h/o sickle cell anemia, frequent admissions for crisis. Patient presents with typical low back pain symptoms from sickle cell crisis ongoing for the past 3 days. Developed chest pain and SOB. Pain was unrelieved by home meds. Additionally he had left ring finger pain and swelling which he woke up with and denies any injuries.   Assessment/Plan: Sickle cell pain and crisis Continue current management with LA morphine and Dilaudid PCA. CXR negative for findings concerning for acute chest at this time. -Hemoglobin stable at 6.6 status post transfusion of one unit of packed red blood cells11/26 (from 5.6 on 11/26). the patient states his last hemoglobin at discharge earlier this November was 6.5; down again to 5.7 on 11/29 -will again transfuse another 2u PRBC this am and follow -he is to follow up outpatient with Dr. Cyndie Chime -continue scheduled Naprosyn and oxycodone when necessary to improve -Continue Benadryl when necessary  Fracture of phalanx of left ring finger Continue immobilization and Pain control. -repeat x-ray per Dr Amanda Pea today and then further definitive management pending results  Fever/Leukocytosis -Likely reactive secondary to #1-WBC decreased ( on no abx) -chest x-ray with no airspace disease, urinalysis negative for - blood cultures no growth to date   Code Status: Full Code  DVT Prophylaxis: Heparin    Family Communication: Discussed with patient  Disposition Plan: Not ready for discharge.  Consultants: None  Procedures: None  Antibiotics: None  Subjective: Problem with IV access last pm per nsg. Still with back pain, denies N/V  Objective: Vital Signs  Filed Vitals:   08/13/13 0120 08/13/13 0402 08/13/13 0622 08/13/13 0943  BP:   105/53 101/59   Pulse:   106 97  Temp:  99 F (37.2 C) 100.2 F (37.9 C) 100.4 F (38 C)  TempSrc:   Oral Oral  Resp: 17 20 17 20   Height:      Weight:      SpO2: 100% 100% 100%     Intake/Output Summary (Last 24 hours) at 08/13/13 1030 Last data filed at 08/13/13 1027  Gross per 24 hour  Intake   1040 ml  Output   3140 ml  Net  -2100 ml   Filed Weights   08/07/13 1743 08/07/13 2300  Weight: 74.844 kg (165 lb) 80.1 kg (176 lb 9.4 oz)    Intake/Output from previous day: 11/29 0701 - 11/30 0700 In: 1040 [P.O.:240; I.V.:800] Out: 2540 [Urine:2540]  Exam:  General: alert & oriented x 3 In NAD Cardiovascular: RRR, nl S1 s2 Respiratory: CTAB, decreased breath sounds at the bases Abdomen: soft +BS NT/ND, no masses palpable Extremities: No cyanosis and no edema, L. Ring finger splint removed, no cyanosis and no edema   Lab Results:  Basic Metabolic Panel:  Recent Labs Lab 08/07/13 2000 08/09/13 0340 08/10/13 0550 08/11/13 0448 08/12/13 0350  NA 141 139 136 135 139  K 4.0 3.9 4.7 4.6 5.1  CL 108 107 106 103 106  CO2 25 21 19 24 21   GLUCOSE 78 122* 96 90 88  BUN 11 10 15 17 15   CREATININE 1.03 0.93 0.66 0.65 0.70  CALCIUM 9.0 8.3* 9.0 9.0 9.1   Liver Function Tests:  Recent Labs Lab 08/07/13 2000 08/12/13 0350  AST 27 31  ALT 13 13  ALKPHOS 91 112  BILITOT 1.5* 1.9*  PROT 6.9 7.1  ALBUMIN 3.6 3.5   CBC:  Recent Labs Lab 08/07/13 2000 08/08/13 0440 08/09/13 0340 08/10/13 0550 08/11/13 0448 08/12/13 0350  WBC 14.8* 16.5* 18.4* 12.3* 10.2 15.3*  NEUTROABS 7.9*  --   --   --   --   --   HGB 6.1* 6.0* 5.6* 6.7* 6.6* 5.7*  HCT 17.9* 18.1* 16.5* 19.6* 19.8* 17.0*  MCV 98.4 98.4 97.1 94.2 95.7 95.0  PLT 438* 358 349 321 297 308     Studies/Results: No results found.  Medications:  Scheduled: . diphenhydrAMINE  25 mg Oral Once  . feeding supplement (ENSURE COMPLETE)  237 mL Oral BID PC  . fluticasone  1 spray Each Nare Daily  . folic acid  1 mg Oral  Daily  . heparin  5,000 Units Subcutaneous Q8H  . HYDROmorphone PCA 0.3 mg/mL   Intravenous Q4H  . morphine  60 mg Oral BID  . multivitamin with minerals  1 tablet Oral Daily  . naproxen  500 mg Oral BID WC  . pantoprazole  40 mg Oral Daily  . senna-docusate  1 tablet Oral BID  . sodium chloride  10-40 mL Intracatheter Q12H   Continuous: . sodium chloride 100 mL/hr at 08/13/13 0359   QIO:[id number], albuterol, diphenhydrAMINE, oxyCODONE, promethazine, sodium chloride    Principal Problem:   Sickle cell pain crisis Active Problems:   Sickle cell anemia   Fracture of phalanx of left ring finger        LOS: 6 days   [name]  [facility] Pager [phone] 08/13/2013, 10:30 AM  If 8PM-8AM, please contact night-coverage at [email or web address], password [facility]

## 2013-08-13 NOTE — Progress Notes (Signed)
Patient difficult IV stick.  Pt refuses AM CBC until after central line insertion. Benedetto Coons, NP notified of this. Dennis Bernard

## 2013-08-13 NOTE — Progress Notes (Signed)
Peripherally Inserted Central Catheter/Midline Placement  The IV Nurse has discussed with the patient and/or persons authorized to consent for the patient, the purpose of this procedure and the potential benefits and risks involved with this procedure.  The benefits include less needle sticks, lab draws from the catheter and patient may be discharged home with the catheter.  Risks include, but not limited to, infection, bleeding, blood clot (thrombus formation), and puncture of an artery; nerve damage and irregular heat beat.  Alternatives to this procedure were also discussed.  PICC/Midline Placement Documentation  PICC / Midline Single Lumen PICC Right (Active)     PICC / Midline Double Lumen 08/13/13 Midline Right Basilic 17 cm 0 cm (Active)       Ethelda Chick 08/13/2013, 9:38 AM

## 2013-08-13 NOTE — Progress Notes (Signed)
At 2030, called over to [facility] for Critical Care Medicine regarding central line placement for patient.  Dr. Belinda Block called back and asked if I could have night IV nurse paged to see if she could obtain peripheral IV access due to several emergencies over at Critical Care Medicine preventing critical care medicine to come over at this time for central line insertion.  IV team nurse was able to only get a 24 gauge IV site in left shoulder however suggested not to infuse blood through this site due to his position and size and only means of IV access.  Informed Dr. Belinda Block of peripheral IV insertion and report from IV nurse.  Also, updated T. Claiborne Billings, NP of this.  NP instructed RN to make sure Critical Care team keep patient on the list for central line insertion. Talked to Dr. Delford Field and was told patient is on Critical Care team list for central line insertion to be performed in the morning. Will continue to monitor. Newman Nip [place]

## 2013-08-13 NOTE — Progress Notes (Signed)
RN called CRNA in hospital to attempt peripheral IV insertion for blood transfusion. Attempted x2 without success. Benedetto Coons, NP notified. Will continue to monitor. Newman Nip [place]

## 2013-08-14 ENCOUNTER — Inpatient Hospital Stay (HOSPITAL_COMMUNITY): Payer: Medicare Other

## 2013-08-14 DIAGNOSIS — T8092XA Unspecified transfusion reaction, initial encounter: Secondary | ICD-10-CM

## 2013-08-14 LAB — URINALYSIS, ROUTINE W REFLEX MICROSCOPIC
Glucose, UA: NEGATIVE mg/dL
Hgb urine dipstick: NEGATIVE
Ketones, ur: NEGATIVE mg/dL
Leukocytes, UA: NEGATIVE
Protein, ur: NEGATIVE mg/dL
Specific Gravity, Urine: 1.015 (ref 1.005–1.030)
pH: 6.5 (ref 5.0–8.0)

## 2013-08-14 LAB — CBC
HCT: 15.4 % — ABNORMAL LOW (ref 39.0–52.0)
Hemoglobin: 5.2 g/dL — CL (ref 13.0–17.0)
MCHC: 33.8 g/dL (ref 30.0–36.0)
MCV: 94.5 fL (ref 78.0–100.0)
Platelets: 277 10*3/uL (ref 150–400)
RBC: 1.63 MIL/uL — ABNORMAL LOW (ref 4.22–5.81)
RDW: 15.8 % — ABNORMAL HIGH (ref 11.5–15.5)
WBC: 15.3 10*3/uL — ABNORMAL HIGH (ref 4.0–10.5)

## 2013-08-14 MED ORDER — AZITHROMYCIN 500 MG PO TABS
500.0000 mg | ORAL_TABLET | Freq: Every day | ORAL | Status: DC
  Administered 2013-08-14 – 2013-08-15 (×2): 500 mg via ORAL
  Filled 2013-08-14 (×3): qty 1

## 2013-08-14 NOTE — Progress Notes (Signed)
On call midlevel, Lenny Pastel notified that pt's temp was up to  1021.4 and orders received. CXR, nasal swab for flu pcr and blood cultures done at this time. Will cont to monitor.

## 2013-08-14 NOTE — Progress Notes (Signed)
[facility] PROGRESS NOTE  Dennis Bernard ZOX:[id number] DOB: [birth year] DOA: 08/07/2013  PCP: None here yet. He is originally from [place].  Brief narrative: Dennis Bernard is a 27 y.o. male with h/o sickle cell anemia, frequent admissions for crisis. Patient presents with typical low back pain symptoms from sickle cell crisis ongoing for the past 3 days. Developed chest pain and SOB. Pain was unrelieved by home meds. Additionally he had left ring finger pain and swelling which he woke up with and denies any injuries.   Assessment/Plan: Sickle cell pain and crisis Continue current management with LA morphine and Dilaudid PCA. CXR negative for findings concerning for acute chest at this time. -Hemoglobin stable at 6.6 status post transfusion of one unit of packed red blood cells11/26 (from 5.6 on 11/26). the patient states his last hemoglobin at discharge earlier this November was 6.5; down again to 5.2 on this a.m 12/1 status post transfusion of one unit of packed red blood cells -Has been transfused 2 units of packed red blood cells so far, awaiting blood to be transfused  transfused another 2u PRBC this am and follow -I have consulted Dr. Cyndie Chime to see patient for further recommendations -continue scheduled Naprosyn and oxycodone when necessary to improve -Continue Benadryl when necessary  Fracture of phalanx of left ring finger Continue immobilization and Pain control. -repeat x-ray per Dr Amanda Pea 11/30 still shows comminuted mildly displaced intra-articular fracture of the fourth middle phalanx -Her Remick planning on external fixation procedure -Appreciate the assistance  Fever/Leukocytosis -Likely reactive secondary to #1-WBC decreased ( on no abx) -chest x-ray on admission with no airspace disease, urinalysis negative for infection -Admission blood cultures no growth to date -Repeat blood cultures done and pending at this time, urinalysis remains negative for  infection -Chest x-ray with increased very hilar markings and central peribronchial thickening consistent with bronchitis and or edema -Will place on empiric antibiotics for bronchitis, decrease IV fluids and follow   Code Status: Full Code  DVT Prophylaxis: Heparin    Family Communication: Discussed with patient  Disposition Plan: Not ready for discharge.  Consultants: None  Procedures: None  Antibiotics: None  Subjective: Pain about the same today, denies nausea or vomiting no gross bleeding. Denies shortness of breath  Objective: Vital Signs  Filed Vitals:   08/14/13 0303 08/14/13 0411 08/14/13 0559 08/14/13 0809  BP:   92/46   Pulse:   91   Temp:   99.3 F (37.4 C)   TempSrc:   Oral   Resp: 21 9 18 20   Height:      Weight:      SpO2: 100% 97% 100% 100%    Intake/Output Summary (Last 24 hours) at 08/14/13 1004 Last data filed at 08/14/13 0850  Gross per 24 hour  Intake    120 ml  Output   3600 ml  Net  -3480 ml   Filed Weights   08/07/13 1743 08/07/13 2300  Weight: 74.844 kg (165 lb) 80.1 kg (176 lb 9.4 oz)    Intake/Output from previous day: 11/30 0701 - 12/01 0700 In: 240 [P.O.:240] Out: 3100 [Urine:3100]  Exam:  General: alert & oriented x 3 In NAD Cardiovascular: RRR, nl S1 s2 Respiratory:  decreased breath sounds at the bases, no crackles or wheezes Abdomen: soft +BS NT/ND, no masses palpable Extremities: No cyanosis and no edema, L. Ring finger splint removed, no cyanosis and no edema   Lab Results:  Basic Metabolic Panel:  Recent Labs Lab 08/07/13 2000 08/09/13 0340  08/10/13 0550 08/11/13 0448 08/12/13 0350  NA 141 139 136 135 139  K 4.0 3.9 4.7 4.6 5.1  CL 108 107 106 103 106  CO2 25 21 19 24 21   GLUCOSE 78 122* 96 90 88  BUN 11 10 15 17 15   CREATININE 1.03 0.93 0.66 0.65 0.70  CALCIUM 9.0 8.3* 9.0 9.0 9.1   Liver Function Tests:  Recent Labs Lab 08/07/13 2000 08/12/13 0350  AST 27 31  ALT 13 13  ALKPHOS 91 112   BILITOT 1.5* 1.9*  PROT 6.9 7.1  ALBUMIN 3.6 3.5   CBC:  Recent Labs Lab 08/07/13 2000  08/09/13 0340 08/10/13 0550 08/11/13 0448 08/12/13 0350 08/13/13 1525 08/14/13 0445  WBC 14.8*  < > 18.4* 12.3* 10.2 15.3*  --  15.3*  NEUTROABS 7.9*  --   --   --   --   --   --   --   HGB 6.1*  < > 5.6* 6.7* 6.6* 5.7* 5.7* 5.2*  HCT 17.9*  < > 16.5* 19.6* 19.8* 17.0* 16.5* 15.4*  MCV 98.4  < > 97.1 94.2 95.7 95.0  --  94.5  PLT 438*  < > 349 321 297 308  --  277  < > = values in this interval not displayed.   Studies/Results: Dg Chest Port 1 View  08/14/2013   *RADIOLOGY REPORT*  Clinical Data: Fever  PORTABLE CHEST - 1 VIEW  Comparison: 08/07/2013  Findings: Increased interstitial perihilar markings and peribronchial thickening.  Mild right suprahilar airspace opacity. No pleural effusion or pneumothorax.  Heart size upper normal. Mediastinal contours otherwise within normal range.  Left shoulder arthroplasty.  Right shoulder degenerative changes.  Right PICC has retracted, with tip projecting over the scapula. Multiple other tube/wires projecting over the patient are presumably external.  IMPRESSION: Increased perihilar markings and central peribronchial thickening may reflect bronchitis and/or edema.  Mild right suprahilar opacity may reflect the same process or developing pneumonia.  Right PICC has retracted, with tip projecting over the right scapula.   Original Report Authenticated By: Jearld Lesch, M.D.   Dg Hand Complete Left  08/13/2013   CLINICAL DATA:  Fracture involving left 4th PIP joint, increased pain/swelling  EXAM: LEFT HAND - COMPLETE 3+ VIEW  COMPARISON:  08/07/2013  FINDINGS: Comminuted, mildly displaced fracture involving the base of the 4th middle phalanx with associated intra-articular extension, unchanged.  No additional fracture is seen.  The joint spaces are preserved.  Mild soft tissue swelling along the proximal 4th digit, unchanged.  IMPRESSION: Comminuted, mildly  displaced intra-articular fracture involving the base of the 4th middle phalanx, unchanged.   Electronically Signed   By: Charline Bills M.D.   On: 08/13/2013 11:30    Medications:  Scheduled: . acetaminophen  650 mg Oral Once  . diphenhydrAMINE  25 mg Oral Once  . diphenhydrAMINE  25 mg Oral Once  . feeding supplement (ENSURE COMPLETE)  237 mL Oral BID PC  . fluticasone  1 spray Each Nare Daily  . folic acid  1 mg Oral Daily  . heparin  5,000 Units Subcutaneous Q8H  . HYDROmorphone PCA 0.3 mg/mL   Intravenous Q4H  . morphine  60 mg Oral BID  . multivitamin with minerals  1 tablet Oral Daily  . naproxen  500 mg Oral BID WC  . pantoprazole  40 mg Oral Daily  . senna-docusate  1 tablet Oral BID  . sodium chloride  10-40 mL Intracatheter Q12H   Continuous: .  sodium chloride 100 mL/hr at 08/14/13 8295   AOZ:[id number], albuterol, diphenhydrAMINE, oxyCODONE, promethazine, sodium chloride    Principal Problem:   Sickle cell pain crisis Active Problems:   Sickle cell anemia   Fracture of phalanx of left ring finger        LOS: 7 days   [name]  [facility] Pager [phone] 08/14/2013, 10:04 AM  If 8PM-8AM, please contact night-coverage at [email or web address], password [facility]

## 2013-08-14 NOTE — Consult Note (Signed)
Referring MD:   PCP:     Reason for Referral: Assistance in management of this man with multiple alloantibodies  Chief Complaint  Patient presents with  . Sickle Cell Pain Crisis    HPI:  27 year old man with sickle cell disease who I know from previous hospitalizations. 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 Despite attempts at finding most compatible units, he has had intermittent problems with delayed hemolytic transfusion reactions complicating his sickle crises. There are really no effective strategies to deal with this. The best strategy is to minimize transfusions and only give blood his hemoglobin is less than 5 or if there is clinical evidence for significant hemolysis related to a sickle cell crisis. I premedicated his transfusions with Tylenol, Benadryl, and Solu-Medrol during his last admission and he seemed to hold onto the blood longer and had no acute reactions.  He was just discharged from the hospital on November 13 after a prolonged admission. He was only home for one week when he began to get into another crisis with increasing low back pain. He was readmitted again on November 24. He fell down on the same day and fractured and dislocated his left ring finger.  He denied any fever at home, no cough, no sore throat, no headache, no earache, nausea but no vomiting, no diarrhea, no hematuria, no hematochezia. He had urinary frequency without dysuria. Since he has been in the hospital, he has had intermittent fevers as high as 102.4  which started on November 26, subsided after 24 hours, and recurred again on November 29, subsided and recurred again today. Fever is correlated with blood transfusions which he received on November 24, November 26, November 30 and December 1. At time of type and cross on November 29 he had a positive direct antiglobulin test with the presence of a warm autoantibody. These findings are consistent with a delayed hemolytic  transfusion reaction.  Past Medical History  Diagnosis Date  . Sickle cell disease   . Avascular necrosis of femur head, right   . H/O allergic rhinitis   . H/O hypokalemia   . Chronic pain syndrome   . H/O wheezing     with colds  . 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 Cholecystectomy  1995  . Portacath placement Left 07/04/2012  . Exchange transfusion  02/2008    perioperatively for shoulder surgery  . Total shoulder replacement Left 04/09/2008  :  . acetaminophen  650 mg Oral Once  . azithromycin  500 mg Oral Daily  . diphenhydrAMINE  25 mg Oral Once  . diphenhydrAMINE  25 mg Oral Once  . feeding supplement (ENSURE COMPLETE)  237 mL Oral BID PC  . fluticasone  1 spray Each Nare Daily  . folic acid  1 mg Oral Daily  . heparin  5,000 Units Subcutaneous Q8H  . HYDROmorphone PCA 0.3 mg/mL   Intravenous Q4H  . morphine  60 mg Oral BID  . multivitamin with minerals  1 tablet Oral Daily  . naproxen  500 mg Oral BID WC  . pantoprazole  40 mg Oral Daily  . senna-docusate  1 tablet Oral BID  . sodium chloride  10-40 mL Intracatheter Q12H  :  No Known Allergies:  Family History  Problem Relation Age of Onset  . Sickle cell anemia Brother age 54    . Sickle cell trait Father   . Sickle cell trait Mother   .  Diabetes Mellitus II Mother   . Sickle cell anemia Paternal Uncle   . Asthma Neg Hx   . Allergic rhinitis Neg Hx   :  History   Social History  . Marital Status: Single    Spouse Name: N/A    Number of Children: 0  . Years of Education: N/A   Occupational History  . Student    Social History Main Topics  . Smoking status: Never Smoker   . Smokeless tobacco: Never Used  . Alcohol Use: No  . Drug Use: No  . Sexual Activity: No   Other Topics Concern  . Not on file   Social History Narrative   Patient lives in [place].   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   Is from [place].    Usually followed at [facility] for his sickle cell disease.  Primary hematologist Dr. Tehillah Cipriani Ivanoff at [facility].             :  ROS: See history of present illness   Vitals: Filed Vitals:   08/14/13 1927  BP:   Pulse:   Temp:   Resp: 15    PHYSICAL EXAM: General appearance: Young [place] man who is on is uncomfortable and in pain. He is wearing oxygen. HEENT: Pharynx no erythema or exudate. Full range of motion of his neck. Lymph Nodes: No cervical, supraclavicular, or axillary adenopathy Resp: Clear to auscultation resonant to percussion Cardio: Regular rhythm no murmur gallop or rub Vascular: No carotid bruits, no cyanosis Breasts: GI: Abdomen soft, nontender, liver palpable about 4 cm below right costal margin, tender edge, no splenomegaly GU: Extremities: No edema, no calf tenderness Neurologic: He is alert, awake, oriented, PERRLA, motor strength 5 over 5, reflexes 1+ symmetric Skin: No rash or ecchymosis  Labs:   Recent Labs  08/12/13 0350 08/13/13 1525 08/14/13 0445  WBC 15.3*  --  15.3*  HGB 5.7* 5.7* 5.2*  HCT 17.0* 16.5* 15.4*  PLT 308  --  277    Recent Labs  08/12/13 0350  NA 139  K 5.1  CL 106  CO2 21  GLUCOSE 88  BUN 15  CREATININE 0.70  CALCIUM 9.1    Images Studies/Results:  Dg Chest Port 1 View  08/14/2013   *RADIOLOGY REPORT*  Clinical Data: Fever  PORTABLE CHEST - 1 VIEW  Comparison: 08/07/2013  Findings: Increased interstitial perihilar markings and peribronchial thickening.  Mild right suprahilar airspace opacity. No pleural effusion or pneumothorax.  Heart size upper normal. Mediastinal contours otherwise within normal range.  Left shoulder arthroplasty.  Right shoulder degenerative changes.  Right PICC has retracted, with tip projecting over the scapula. Multiple other tube/wires projecting over the patient are presumably external.  IMPRESSION: Increased perihilar markings and central peribronchial thickening may reflect bronchitis and/or  edema.  Mild right suprahilar opacity may reflect the same process or developing pneumonia.  Right PICC has retracted, with tip projecting over the right scapula.   Original Report Authenticated By: Jearld Lesch, M.D.   Dg Hand Complete Left  08/13/2013   CLINICAL DATA:  Fracture involving left 4th PIP joint, increased pain/swelling  EXAM: LEFT HAND - COMPLETE 3+ VIEW  COMPARISON:  08/07/2013  FINDINGS: Comminuted, mildly displaced fracture involving the base of the 4th middle phalanx with associated intra-articular extension, unchanged.  No additional fracture is seen.  The joint spaces are preserved.  Mild soft tissue swelling along the proximal 4th digit, unchanged.  IMPRESSION: Comminuted, mildly displaced intra-articular fracture involving the base of  the 4th middle phalanx, unchanged.   Electronically Signed   By: Charline Bills M.D.   On: 08/13/2013 11:30        Assessment: Principal Problem:   Sickle cell pain crisis Active Problems:   Sickle cell anemia   Fracture of phalanx of left ring finger  Impression: #1. Alloantibody  formation to blood transfusions. #2. Intermittent delayed hemolytic transfusion reaction secondary to #1. #3. Temperature spikes correlate with transfusions and do not appear to be infectious in nature #3. Sickle cell disease with acute crisis  Recommendation: Transfuse least incompatible units only if hemoglobin less than 5 Premedicate all transfusions with Solu-Medrol 40 mg IV, Tylenol 650 mg by mouth, Benadryl 50 mg by mouth. Unfortunately there are no easy solutions to his problem. Although there is no defined role for chronic steroid use, if there is evidence for a significant delayed hemolytic transfusion reaction, pulse steroid use with rapid taper may be helpful in some situations. I will be in ongoing communication with the blood bank to assess his transfusion needs.  Please add serum LDH next time blood is  drawn.   Recommendation:   Levert Feinstein 08/14/2013, 8:39 PM

## 2013-08-14 NOTE — Progress Notes (Signed)
Patient ID: Dennis Bernard, male   DOB: [birth year], 27 y.o.   MRN: [id number] Patient seen and examined.  Patient is alert and oriented.  X-rays are reviewed from yesterday. The position is unchanged.  I will discuss his care with his primary care physician/hospitalist.  We would recommend a external fixation device which can be placed under local with IV sedation in the operating room to the ring finger. The patient arrived proceed with this. Unless there is an issue of infection I would move forward with this tomorrow afternoon. The patient understands this. I discussed with him risk and benefits etc.  All questions have been encouraged and answered  Spent and 30 minutes at bedside with him discussing the details of the operation and its aftermath as well as the pins which would be in place for 2-3 weeks until removal.  Oletta Cohn.D.

## 2013-08-14 NOTE — Progress Notes (Signed)
Entered patient's room to give medicine and noticed pt was in bathroom.  IV was disconnected and hanging on the pole in pause mode.  When pt came out of bathroom, I explained the importance of not disconnecting his IV and how his chances for infection was increased by doing this.  Told him to call for assistance when he needs to go to the bathroom.  Pt stated he understood.

## 2013-08-15 LAB — CBC
HCT: 14 % — ABNORMAL LOW (ref 39.0–52.0)
Hemoglobin: 4.8 g/dL — CL (ref 13.0–17.0)
MCH: 32.2 pg (ref 26.0–34.0)
MCV: 94 fL (ref 78.0–100.0)
RBC: 1.49 MIL/uL — ABNORMAL LOW (ref 4.22–5.81)
RDW: 16.2 % — ABNORMAL HIGH (ref 11.5–15.5)
WBC: 15 10*3/uL — ABNORMAL HIGH (ref 4.0–10.5)

## 2013-08-15 LAB — COMPREHENSIVE METABOLIC PANEL
AST: 30 U/L (ref 0–37)
Albumin: 3.3 g/dL — ABNORMAL LOW (ref 3.5–5.2)
BUN: 15 mg/dL (ref 6–23)
CO2: 23 mEq/L (ref 19–32)
Calcium: 9.2 mg/dL (ref 8.4–10.5)
Chloride: 101 mEq/L (ref 96–112)
Creatinine, Ser: 0.72 mg/dL (ref 0.50–1.35)
GFR calc Af Amer: >90 mL/min (ref >90)
GFR calc non Af Amer: >90 mL/min (ref >90)
Glucose, Bld: 91 mg/dL (ref 70–99)
Total Bilirubin: 2.9 mg/dL — ABNORMAL HIGH (ref 0.3–1.2)
Total Protein: 7.1 g/dL (ref 6.0–8.3)

## 2013-08-15 LAB — CULTURE, BLOOD (ROUTINE X 2): Culture: NO GROWTH

## 2013-08-15 LAB — RETICULOCYTES
RBC.: 1.49 MIL/uL — ABNORMAL LOW (ref 4.22–5.81)
Retic Ct Pct: 8.1 % — ABNORMAL HIGH (ref 0.4–3.1)

## 2013-08-15 MED ORDER — ENSURE COMPLETE PO LIQD
237.0000 mL | Freq: Three times a day (TID) | ORAL | Status: DC
  Administered 2013-08-16 – 2013-08-28 (×35): 237 mL via ORAL

## 2013-08-15 NOTE — Progress Notes (Signed)
NUTRITION FOLLOW UP  Intervention:   Increase Ensure Complete to TID Continue Multivitamin with minerals daily  Nutrition Dx:   Inadequate oral intake related to poor appetite as evidenced by pt's report; ongoing  Goal:   Pt to meet >/= 90% of their estimated nutrition needs; not met   Monitor:   PO intake; 0-50% of meals Weight; no new weights Labs; low hemoglobin, elevated LDH, slightly low sodium, slightly low albumin  Assessment:   27 y.o. male with h/o sickle cell anemia, frequent admissions for crisis. Patient presents with typical low back pain symptoms from sickle cell crisis ongoing for the past 3 days. Developed chest pain and SOB. Pt meeting with physician in room at time of visit. Per nursing notes pt has been eating 50% of some meals but, refused to eat all day yesterday. RN reports pt has been asking for Strawberry Ensure and drinking them twice daily without problems.   Height: Ht Readings from Last 1 Encounters:  08/07/13 5\' 9"  (1.753 m)    Weight Status:   Wt Readings from Last 1 Encounters:  08/07/13 176 lb 9.4 oz (80.1 kg)    Re-estimated needs:  Kcal: 2000-2200  Protein: 100-110 grams  Fluid: 2-2.2 L/day  Skin: intact; non-pitting RUE, RLE, and LLE edema  Diet Order: General   Intake/Output Summary (Last 24 hours) at 08/15/13 1642 Last data filed at 08/15/13 1406  Gross per 24 hour  Intake    235 ml  Output   3825 ml  Net  -3590 ml    Last BM: 12/1   Labs:   Recent Labs Lab 08/11/13 0448 08/12/13 0350 08/15/13 0513  NA 135 139 134*  K 4.6 5.1 4.9  CL 103 106 101  CO2 24 21 23   BUN 17 15 15   CREATININE 0.65 0.70 0.72  CALCIUM 9.0 9.1 9.2  GLUCOSE 90 88 91    CBG (last 3)  No results found for this basename: GLUCAP,  in the last 72 hours  Scheduled Meds: . acetaminophen  650 mg Oral Once  . azithromycin  500 mg Oral Daily  . diphenhydrAMINE  25 mg Oral Once  . diphenhydrAMINE  25 mg Oral Once  . feeding supplement  (ENSURE COMPLETE)  237 mL Oral BID PC  . fluticasone  1 spray Each Nare Daily  . folic acid  1 mg Oral Daily  . heparin  5,000 Units Subcutaneous Q8H  . HYDROmorphone PCA 0.3 mg/mL   Intravenous Q4H  . morphine  60 mg Oral BID  . multivitamin with minerals  1 tablet Oral Daily  . naproxen  500 mg Oral BID WC  . pantoprazole  40 mg Oral Daily  . senna-docusate  1 tablet Oral BID  . sodium chloride  10-40 mL Intracatheter Q12H    Continuous Infusions: . sodium chloride 30 mL/hr at 08/14/13 2213    Ian Malkin RD, LDN Inpatient Clinical Dietitian Pager: [phone] After Hours Pager: [phone]

## 2013-08-15 NOTE — Progress Notes (Signed)
[facility] PROGRESS NOTE  Dennis Bernard RUE:[id number] DOB: 06/02/1986 DOA: 08/07/2013  PCP: None here yet. He is originally from [place].  Brief narrative: Dennis Bernard is a 27 y.o. male with h/o sickle cell anemia, frequent admissions for crisis. Patient presents with typical low back pain symptoms from sickle cell crisis ongoing for the past 3 days. Developed chest pain and SOB. Pain was unrelieved by home meds. Additionally he had left ring finger pain and swelling which he woke up with and denies any injuries.   Assessment/Plan: Sickle cell pain and crisis Continue current management with LA morphine and Dilaudid PCA. CXR negative for findings concerning for acute chest at this time. -Hemoglobin stable at 6.6 status post transfusion of one unit of packed red blood cells11/26 (from 5.6 on 11/26) and another unit was transfused subsequently. the patient states his last hemoglobin at discharge earlier this November was 6.5; hgb down again to 4.8 on this a.m 12/2  -Has been transfused 2 units of packed red blood cells so far, still awaiting awaiting blood to be transfused  another 2u PRBC >> please note that patient will need to be premedicated with Solu-Medrol 40 IV, Tylenol 650 by mouth and Benadryl 50 by mouth prior to all transfusions -Appreciate Dr. Patsy Lager recommendations as per consult note of 12/1,  noted recommendation to only transfuse for hemoglobin less than 5. -continue scheduled Naprosyn and oxycodone when necessary for pain control. -Continue Benadryl when necessary -Will obtain LDH and follow. Fracture of phalanx of left ring finger Continue immobilization and Pain control. -repeat x-ray per Dr Amanda Pea 11/30 still shows comminuted mildly displaced intra-articular fracture of the fourth middle phalanx -Dr Amanda Pea planning on external fixation procedure>> will recommend for surgical procedure to be held until hemoglobin improves posttransfusion -Appreciate the  assistance  Fever/Leukocytosis -Likely reactive secondary to #1-WBC decreased ( on no abx) versus delayed transfusion reaction  -chest x-ray on admission with no airspace disease, urinalysis negative for infection -Admission blood cultures no growth to date -All cultures with no growth to date -Chest x-ray 12/1 with increased very hilar markings and central peribronchial thickening consistent with bronchitis and or edema -Continue empiric antibiotics for bronchitis, decrease IV fluids and follow -Fevers more likely secondary to delayed transfusion reaction as per onc note  Code Status: Full Code  DVT Prophylaxis: Heparin    Family Communication: Discussed with patient  Disposition Plan: Not ready for discharge.  Consultants: None  Procedures: None  Antibiotics: None  Subjective: States pain pain about the same today, denies any new complaints.  Objective: Vital Signs  Filed Vitals:   08/15/13 0434 08/15/13 0610 08/15/13 0736 08/15/13 1124  BP:  104/51 102/37   Pulse:  96 96   Temp:  100.1 F (37.8 C) 98.8 F (37.1 C)   TempSrc:  Oral Oral   Resp: 21 17 18 16   Height:      Weight:      SpO2: 100% 100% 100% 100%    Intake/Output Summary (Last 24 hours) at 08/15/13 1259 Last data filed at 08/15/13 0738  Gross per 24 hour  Intake    775 ml  Output   3626 ml  Net  -2851 ml   Filed Weights   08/07/13 1743 08/07/13 2300  Weight: 74.844 kg (165 lb) 80.1 kg (176 lb 9.4 oz)    Intake/Output from previous day: 12/01 0701 - 12/02 0700 In: 775 [I.V.:775] Out: 3946 [Urine:3945; Stool:1]  Exam:  General: alert & oriented x 3 In NAD Cardiovascular:  RRR, nl S1 s2 Respiratory:  decreased breath sounds at the bases, no crackles or wheezes Abdomen: soft +BS NT/ND, no masses palpable Extremities: No cyanosis and no edema, L. Ring finger immobilized with splint, no cyanosis and no edema   Lab Results:  Basic Metabolic Panel:  Recent Labs Lab 08/09/13 0340  08/10/13 0550 08/11/13 0448 08/12/13 0350 08/15/13 0513  NA 139 136 135 139 134*  K 3.9 4.7 4.6 5.1 4.9  CL 107 106 103 106 101  CO2 21 19 24 21 23   GLUCOSE 122* 96 90 88 91  BUN 10 15 17 15 15   CREATININE 0.93 0.66 0.65 0.70 0.72  CALCIUM 8.3* 9.0 9.0 9.1 9.2   Liver Function Tests:  Recent Labs Lab 08/12/13 0350 08/15/13 0513  AST 31 30  ALT 13 19  ALKPHOS 112 157*  BILITOT 1.9* 2.9*  PROT 7.1 7.1  ALBUMIN 3.5 3.3*   CBC:  Recent Labs Lab 08/10/13 0550 08/11/13 0448 08/12/13 0350 08/13/13 1525 08/14/13 0445 08/15/13 0513  WBC 12.3* 10.2 15.3*  --  15.3* 15.0*  HGB 6.7* 6.6* 5.7* 5.7* 5.2* 4.8*  HCT 19.6* 19.8* 17.0* 16.5* 15.4* 14.0*  MCV 94.2 95.7 95.0  --  94.5 94.0  PLT 321 297 308  --  277 337     Studies/Results: Dg Chest Port 1 View  08/14/2013   *RADIOLOGY REPORT*  Clinical Data: Fever  PORTABLE CHEST - 1 VIEW  Comparison: 08/07/2013  Findings: Increased interstitial perihilar markings and peribronchial thickening.  Mild right suprahilar airspace opacity. No pleural effusion or pneumothorax.  Heart size upper normal. Mediastinal contours otherwise within normal range.  Left shoulder arthroplasty.  Right shoulder degenerative changes.  Right PICC has retracted, with tip projecting over the scapula. Multiple other tube/wires projecting over the patient are presumably external.  IMPRESSION: Increased perihilar markings and central peribronchial thickening may reflect bronchitis and/or edema.  Mild right suprahilar opacity may reflect the same process or developing pneumonia.  Right PICC has retracted, with tip projecting over the right scapula.   Original Report Authenticated By: Jearld Lesch, M.D.    Medications:  Scheduled: . acetaminophen  650 mg Oral Once  . azithromycin  500 mg Oral Daily  . diphenhydrAMINE  25 mg Oral Once  . diphenhydrAMINE  25 mg Oral Once  . feeding supplement (ENSURE COMPLETE)  237 mL Oral BID PC  . fluticasone  1 spray  Each Nare Daily  . folic acid  1 mg Oral Daily  . heparin  5,000 Units Subcutaneous Q8H  . HYDROmorphone PCA 0.3 mg/mL   Intravenous Q4H  . morphine  60 mg Oral BID  . multivitamin with minerals  1 tablet Oral Daily  . naproxen  500 mg Oral BID WC  . pantoprazole  40 mg Oral Daily  . senna-docusate  1 tablet Oral BID  . sodium chloride  10-40 mL Intracatheter Q12H   Continuous: . sodium chloride 30 mL/hr at 08/14/13 2213   JXB:[id number], albuterol, diphenhydrAMINE, oxyCODONE, promethazine, sodium chloride    Principal Problem:   Sickle cell pain crisis Active Problems:   Sickle cell anemia   Fracture of phalanx of left ring finger        LOS: 8 days   [name]  [facility] Pager [phone] 08/15/2013, 12:59 PM  If 8PM-8AM, please contact night-coverage at [email or web address], password [facility]

## 2013-08-15 NOTE — Progress Notes (Signed)
Subjective:patient seen Tired and feeling some continued pain Chart reviewed  Objective: Vital signs in last 24 hours: Temp:  [98.1 F (36.7 C)-100.1 F (37.8 C)] 98.2 F (36.8 C) (12/02 2205) Pulse Rate:  [90-98] 97 (12/02 2205) Resp:  [15-21] 16 (12/02 2205) BP: (95-125)/(37-66) 95/53 mmHg (12/02 2205) SpO2:  [97 %-100 %] 100 % (12/02 2205)  Intake/Output from previous day: 12/01 0701 - 12/02 0700 In: 775 [I.V.:775] Out: 3946 [Urine:3945; Stool:1] Intake/Output this shift:     Recent Labs  08/13/13 1525 08/14/13 0445 08/15/13 0513  HGB 5.7* 5.2* 4.8*    Recent Labs  08/14/13 0445 08/15/13 0513  WBC 15.3* 15.0*  RBC 1.63* 1.49*  1.49*  HCT 15.4* 14.0*  PLT 277 337    Recent Labs  08/15/13 0513  NA 134*  K 4.9  CL 101  CO2 23  BUN 15  CREATININE 0.72  GLUCOSE 91  CALCIUM 9.2   No results found for this basename: LABPT, INR,  in the last 72 hours  PE: left ring finger is unchanged  Assessment/Plan: Will continue to follow D/w patient all issues and challenges Consider ext fix later in the week if stable   Catheryne Deford III,Tabetha Haraway M 08/15/2013, 10:23 PM

## 2013-08-16 LAB — TYPE AND SCREEN
DAT, IgG: POSITIVE
Unit division: 0
Unit division: 0
Unit division: 0

## 2013-08-16 LAB — CBC
HCT: 15.4 % — ABNORMAL LOW (ref 39.0–52.0)
MCH: 31.4 pg (ref 26.0–34.0)
MCHC: 33.8 g/dL (ref 30.0–36.0)
MCV: 93 fL (ref 78.0–100.0)
MCV: 93.9 fL (ref 78.0–100.0)
Platelets: 376 10*3/uL (ref 150–400)
RBC: 1.72 MIL/uL — ABNORMAL LOW (ref 4.22–5.81)
RBC: 1.64 MIL/uL — ABNORMAL LOW (ref 4.22–5.81)
WBC: 14.8 10*3/uL — ABNORMAL HIGH (ref 4.0–10.5)
WBC: 15 10*3/uL — ABNORMAL HIGH (ref 4.0–10.5)

## 2013-08-16 MED ORDER — DEXTROSE 5 % IV SOLN
2.0000 g | Freq: Two times a day (BID) | INTRAVENOUS | Status: DC
  Administered 2013-08-16 (×2): 2 g via INTRAVENOUS
  Filled 2013-08-16 (×3): qty 2

## 2013-08-16 MED ORDER — DIPHENHYDRAMINE HCL 50 MG/ML IJ SOLN
25.0000 mg | Freq: Two times a day (BID) | INTRAMUSCULAR | Status: DC | PRN
  Administered 2013-08-16 – 2013-08-28 (×23): 25 mg via INTRAVENOUS
  Filled 2013-08-16 (×23): qty 1

## 2013-08-16 MED ORDER — HYDROMORPHONE 0.3 MG/ML IV SOLN
INTRAVENOUS | Status: DC
  Administered 2013-08-16: 6.64 mg via INTRAVENOUS
  Administered 2013-08-16: 7.35 mg via INTRAVENOUS
  Administered 2013-08-16: 6.22 mg via INTRAVENOUS
  Administered 2013-08-17: 19:00:00 via INTRAVENOUS
  Administered 2013-08-17: 7.55 mg via INTRAVENOUS
  Administered 2013-08-17: 12.92 mg via INTRAVENOUS
  Administered 2013-08-17: 10:00:00 via INTRAVENOUS
  Administered 2013-08-17: 7.48 mg via INTRAVENOUS
  Administered 2013-08-17 – 2013-08-18 (×3): via INTRAVENOUS
  Administered 2013-08-18: 12.17 mg via INTRAVENOUS
  Administered 2013-08-18: via INTRAVENOUS
  Filled 2013-08-16 (×11): qty 25

## 2013-08-16 MED ORDER — DIPHENHYDRAMINE HCL 25 MG PO CAPS
25.0000 mg | ORAL_CAPSULE | Freq: Two times a day (BID) | ORAL | Status: DC | PRN
  Filled 2013-08-16: qty 2

## 2013-08-16 NOTE — Progress Notes (Signed)
Pt received 1 unit of scheduled blood which ended at 0000. Notified by blood bank that cross match expired at midnight and that a new type and screen would need to be drawn before 2nd unit could be transfused. Awaiting word from blood bank that a new unit of blood is ready to be transfused. Will continue to monitor. Fraser Din, RN

## 2013-08-16 NOTE — Progress Notes (Signed)
ANTIBIOTIC CONSULT NOTE - INITIAL  Pharmacy Consult for Cefepime Indication: UTI  No Known Allergies  Patient Measurements: Height: 5\' 9"  (175.3 cm) Weight: 176 lb 9.4 oz (80.1 kg) IBW/kg (Calculated) : 70.7  Vital Signs: Temp: 99.1 F (37.3 C) (12/03 0410) Temp src: Oral (12/03 0410) BP: 101/54 mmHg (12/03 0410) Pulse Rate: 96 (12/03 0410) Intake/Output from previous day: 12/02 0701 - 12/03 0700 In: 2261 [P.O.:560; I.V.:1401; Blood:300] Out: 3225 [Urine:3225]  Labs:  Recent Labs  08/14/13 0445 08/15/13 0513 08/16/13 0505  WBC 15.3* 15.0* 14.8*  HGB 5.2* 4.8* 5.4*  PLT 277 337 376  CREATININE  --  0.72  --    Estimated Creatinine Clearance: 136.2 ml/min (by C-G formula based on Cr of 0.72).  Microbiology: Recent Results (from the past 720 hour(s))  CULTURE, BLOOD (ROUTINE X 2)     Status: None   Collection Time    07/21/13  5:00 AM      Result Value Range Status   Specimen Description BLOOD PICC   Final   Special Requests BOTTLES DRAWN AEROBIC AND ANAEROBIC 4.5 EACH   Final   Culture  Setup Time     Final   Value: 07/21/2013 09:58     Performed at [facility]   Culture     Final   Value: NO GROWTH 5 DAYS     Performed at [facility]   Report Status 07/27/2013 FINAL   Final  URINE CULTURE     Status: None   Collection Time    07/21/13  5:02 AM      Result Value Range Status   Specimen Description URINE, RANDOM   Final   Special Requests none Normal   Final   Culture  Setup Time     Final   Value: 07/21/2013 11:06     Performed at [facility] Count     Final   Value: 2,000 COLONIES/ML     Performed at [facility]   Culture     Final   Value: INSIGNIFICANT GROWTH     Performed at [facility]   Report Status 07/22/2013 FINAL   Final  CULTURE, BLOOD (ROUTINE X 2)     Status: None   Collection Time    07/21/13  6:10 AM      Result Value Range Status   Specimen Description BLOOD LEFT HAND   Final   Special Requests BOTTLES DRAWN AEROBIC ONLY 5CC   Final   Culture  Setup Time     Final   Value: 07/21/2013 09:59     Performed at [facility]   Culture     Final   Value: NO GROWTH 5 DAYS     Performed at [facility]   Report Status 07/27/2013 FINAL   Final  CULTURE, BLOOD (ROUTINE X 2)     Status: None   Collection Time    08/09/13 12:15 PM      Result Value Range Status   Specimen Description BLOOD LEFT HAND   Final   Special Requests BOTTLES DRAWN AEROBIC ONLY 3CC   Final   Culture  Setup Time     Final   Value: 08/09/2013 14:44     Performed at [facility]   Culture     Final   Value: NO GROWTH 5 DAYS     Performed at [facility]   Report Status 08/15/2013 FINAL   Final  CULTURE, BLOOD (ROUTINE X 2)     Status: None   Collection Time    08/09/13 12:28 PM      Result Value Range Status   Specimen Description BLOOD RIGHT HAND   Final   Special Requests BOTTLES DRAWN AEROBIC ONLY 3CC   Final   Culture  Setup Time     Final   Value: 08/09/2013 14:44     Performed at [facility]   Culture     Final   Value: NO GROWTH 5 DAYS     Performed at [facility]   Report Status 08/15/2013 FINAL   Final  CULTURE, BLOOD (ROUTINE X 2)     Status: None   Collection Time    08/14/13  5:10 AM      Result Value Range Status   Specimen Description BLOOD LEFT HAND   Final   Special Requests BOTTLES DRAWN AEROBIC AND ANAEROBIC 3CC EA   Final   Culture  Setup Time     Final   Value: 08/14/2013 08:57     Performed at [facility]   Culture     Final   Value:        BLOOD CULTURE RECEIVED NO GROWTH TO DATE CULTURE WILL BE HELD FOR 5 DAYS BEFORE ISSUING A FINAL NEGATIVE REPORT     Performed at [facility]   Report Status PENDING   Incomplete  CULTURE, BLOOD (ROUTINE X 2)     Status: None   Collection Time    08/14/13  5:16 AM      Result Value Range Status   Specimen Description BLOOD LEFT HAND   Final   Special  Requests BOTTLES DRAWN AEROBIC AND ANAEROBIC 3CC EA   Final   Culture  Setup Time     Final   Value: 08/14/2013 08:57     Performed at [facility]   Culture     Final   Value:        BLOOD CULTURE RECEIVED NO GROWTH TO DATE CULTURE WILL BE HELD FOR 5 DAYS BEFORE ISSUING A FINAL NEGATIVE REPORT     Performed at [facility]   Report Status PENDING   Incomplete  URINE CULTURE     Status: None   Collection Time    08/14/13  8:46 AM      Result Value Range Status   Specimen Description URINE, RANDOM   Final   Special Requests NONE   Final   Culture  Setup Time     Final   Value: 08/14/2013 10:08     Performed at [facility] Count     Final   Value: >=100,000 COLONIES/ML     Performed at [facility]   Culture     Final   Value: PSEUDOMONAS AERUGINOSA     Performed at [facility]   Report Status PENDING   Incomplete    Medical History: Past Medical History  Diagnosis Date  . Sickle cell disease   . Avascular necrosis of femur head, right   . H/O allergic rhinitis   . H/O hypokalemia   . Chronic pain syndrome   . H/O wheezing     with colds  . Non-ABO incompatible blood transfusion 05/30/2013    Medications:  Anti-infectives   Start     Dose/Rate Route Frequency Ordered Stop   08/14/13 1400  azithromycin (ZITHROMAX) tablet 500 mg  Status:  Discontinued  500 mg Oral Daily 08/14/13 1241 08/16/13 0813     Assessment: 27 yo M admitted on 11/24 with sickle cell crisis.  Urine culture now with > 100k Pseudomonas (sensitivities pending) and pharmacy is consulted to dose Cefepime.  Tmax: 99.1  WBCs: 14.8  Renal: SCr 0.72, CrCl > 100 ml/min  Goal of Therapy:  Appropriate abx dosing, eradication of infection.   Plan:   Cefepime 2g IV q12h.  Follow up renal fxn and culture results.   Lynann Beaver PharmD, BCPS Pager [phone] 08/16/2013 8:30 AM

## 2013-08-16 NOTE — Progress Notes (Signed)
Brief Pharmacy Note  Sickle cell patient on Dilaudid PCA.  Received a call in the pharmacy from the night RN taking care of patient that she didn't understand the rate of the patient's PCA.  PCA concentration 0.3 mg/mL, order for 0.5 ml/hr continuous infusion.  Alaris pump programmed at 0.5 mg/hr (instead of 0.5 ml/hr) so the continuous infusion was running at 1.67 ml/hr (instead of 0.5 ml/hr).  The pump was reprogrammed to deliver 0.5 ml/hr of a 0.3 mg/ml Dilaudid PCA (=0.15 mg/hr) and the patient was informed that his PCA settings were changing slightly to reflect the actual ordered dose.  No harm to the patient was evident.  Tomi Bamberger, PharmD, BCPS Clinical Pharmacist Pager: [phone] Pharmacy: [phone] 08/16/2013 9:41 PM

## 2013-08-16 NOTE — Progress Notes (Signed)
Patient ID: Dennis Bernard, male   DOB: 1986-04-28, 27 y.o.   MRN: [id number] Chart reviewed. Patient has been reviewed.  I would certainly consider external fixation/pinning of his left ring finger in the operative arena tomorrow if stable and hemoglobin remains stable.  We will await confirmation from hematology and hospitalist  Azjah Pardo M.D.

## 2013-08-16 NOTE — Progress Notes (Signed)
Picked up patient at 2300. Received report from Chloe RN. Agree with previous assessment. Will continue to monitor closely. Carah Barrientes C, RN  

## 2013-08-16 NOTE — Progress Notes (Signed)
Crisis pain has not subsided yet He received one unit of packed cells with premedication last evening but there is only been a minor change in his hemoglobin from 4.8 to 5.4. 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

## 2013-08-16 NOTE — Progress Notes (Signed)
[facility] PROGRESS NOTE   Dennis Bernard WUJ:[id number] DOB: [birth year] DOA: 08/07/2013 PCP: No primary provider on file.  Assessment/Plan: Sickle cell pain and crisis  Continue current management with LA morphine and Dilaudid PCA. CXR negative for findings concerning for acute chest at this time.  - Hemoglobin was less than 5 last night, status post 1 unit transfusion with recent improvement this morning. He has a second unit on hold and will repeat CBC later tonight and decide if he needs a second one. - Has been transfused 3 units of packed red blood cells so far - pRBC >> please note that patient will need to be premedicated with Solu-Medrol 40 IV, Tylenol 650 by mouth and Benadryl 50 by mouth prior to all transfusions  -Appreciate Dr. Patsy Lager recommendations as per consult note, noted recommendation to only transfuse for hemoglobin less than 5.  -continue scheduled Naprosyn and oxycodone when necessary for pain control.  -Continue Benadryl when necessary  Pseudomonas urinary tract infection - Urine culture is growing pseudomonas aeruginosa greater than 10 to the fifth colonies per mL. This is somewhat surprising given negative initial urinalysis. Patient does have some symptoms of burning with urination and has noticed his urine has been darker. Will go ahead and treat, and this is a complicated urinary tract infection in this male patient. Awaiting sensitivities. Currently on cefepime.  Fracture of phalanx of left ring finger  Continue immobilization and Pain control.  -repeat x-ray per Dr Amanda Pea 11/30 still shows comminuted mildly displaced intra-articular fracture of the fourth middle phalanx  -Dr Amanda Pea planning on external fixation procedure>> will recommend for surgical procedure to be held until hemoglobin improves posttransfusion  - Appreciate the assistance  Fever/Leukocytosis  - Multifactorial due to #1 and also UTI, also delayed transfusion reaction - chest  x-ray on admission with no airspace disease - Admission blood cultures no growth to date  - Chest x-ray 12/1 with increased very hilar markings and central peribronchial thickening consistent with bronchitis and or edema  - Continue empiric antibiotics for bronchitis, decrease IV fluids and follow   Diet: Regular diet Fluids: Normal saline at 30 cc an hour DVT Prophylaxis: Heparin  Code Status: Full code Family Communication: None  Disposition Plan: Inpatient, home when medically ready  Consultants:  Oncology  Orthopedic surgery  Procedures:  none   Antibiotics  Anti-infectives   Start     Dose/Rate Route Frequency Ordered Stop   08/16/13 0900  ceFEPIme (MAXIPIME) 2 g in dextrose 5 % 50 mL IVPB     2 g 100 mL/hr over 30 Minutes Intravenous Every 12 hours 08/16/13 0836     08/14/13 1400  azithromycin (ZITHROMAX) tablet 500 mg  Status:  Discontinued     500 mg Oral Daily 08/14/13 1241 08/16/13 0813     Antibiotics Given (last 72 hours)   Date/Time Action Medication Dose Rate   08/14/13 1349 Given   azithromycin (ZITHROMAX) tablet 500 mg 500 mg    08/15/13 0925 Given   azithromycin (ZITHROMAX) tablet 500 mg 500 mg    08/16/13 9562 Given   ceFEPIme (MAXIPIME) 2 g in dextrose 5 % 50 mL IVPB 2 g 100 mL/hr      HPI/Subjective: - Continues to complain of severe pain typical for a sickle crisis. States that current PCA settings are not helping.  Objective: Filed Vitals:   08/15/13 2305 08/16/13 0000 08/16/13 0154 08/16/13 0410  BP: 110/65 107/61  101/54  Pulse: 83 92  96  Temp: 97.9 F (  36.6 C) 98.9 F (37.2 C)  99.1 F (37.3 C)  TempSrc: Oral Oral  Oral  Resp: 18 18 18 19   Height:      Weight:      SpO2: 100% 100% 100% 100%    Intake/Output Summary (Last 24 hours) at 08/16/13 0813 Last data filed at 08/16/13 0730  Gross per 24 hour  Intake 2260.97 ml  Output   3500 ml  Net -1239.03 ml   Filed Weights   08/07/13 1743 08/07/13 2300  Weight: 74.844 kg  (165 lb) 80.1 kg (176 lb 9.4 oz)    Exam:   General:  NAD  Cardiovascular: regular rate and rhythm, without MRG  Respiratory: good air movement, clear to auscultation throughout, no wheezing, ronchi or rales  Abdomen: soft, not tender to palpation, positive bowel sounds  MSK: no peripheral edema  Neuro: Nonfocal  Data Reviewed: Basic Metabolic Panel:  Recent Labs Lab 08/10/13 0550 08/11/13 0448 08/12/13 0350 08/15/13 0513  NA 136 135 139 134*  K 4.7 4.6 5.1 4.9  CL 106 103 106 101  CO2 19 24 21 23   GLUCOSE 96 90 88 91  BUN 15 17 15 15   CREATININE 0.66 0.65 0.70 0.72  CALCIUM 9.0 9.0 9.1 9.2   Liver Function Tests:  Recent Labs Lab 08/12/13 0350 08/15/13 0513  AST 31 30  ALT 13 19  ALKPHOS 112 157*  BILITOT 1.9* 2.9*  PROT 7.1 7.1  ALBUMIN 3.5 3.3*   CBC:  Recent Labs Lab 08/11/13 0448 08/12/13 0350 08/13/13 1525 08/14/13 0445 08/15/13 0513 08/16/13 0505  WBC 10.2 15.3*  --  15.3* 15.0* 14.8*  HGB 6.6* 5.7* 5.7* 5.2* 4.8* 5.4*  HCT 19.8* 17.0* 16.5* 15.4* 14.0* 16.0*  MCV 95.7 95.0  --  94.5 94.0 93.0  PLT 297 308  --  277 337 376   Recent Results (from the past 240 hour(s))  CULTURE, BLOOD (ROUTINE X 2)     Status: None   Collection Time    08/09/13 12:15 PM      Result Value Range Status   Specimen Description BLOOD LEFT HAND   Final   Special Requests BOTTLES DRAWN AEROBIC ONLY 3CC   Final   Culture  Setup Time     Final   Value: 08/09/2013 14:44     Performed at [facility]   Culture     Final   Value: NO GROWTH 5 DAYS     Performed at [facility]   Report Status 08/15/2013 FINAL   Final  CULTURE, BLOOD (ROUTINE X 2)     Status: None   Collection Time    08/09/13 12:28 PM      Result Value Range Status   Specimen Description BLOOD RIGHT HAND   Final   Special Requests BOTTLES DRAWN AEROBIC ONLY 3CC   Final   Culture  Setup Time     Final   Value: 08/09/2013 14:44     Performed at [facility]    Culture     Final   Value: NO GROWTH 5 DAYS     Performed at [facility]   Report Status 08/15/2013 FINAL   Final  CULTURE, BLOOD (ROUTINE X 2)     Status: None   Collection Time    08/14/13  5:10 AM      Result Value Range Status   Specimen Description BLOOD LEFT HAND   Final   Special Requests BOTTLES DRAWN AEROBIC AND ANAEROBIC  3CC EA   Final   Culture  Setup Time     Final   Value: 08/14/2013 08:57     Performed at [facility]   Culture     Final   Value:        BLOOD CULTURE RECEIVED NO GROWTH TO DATE CULTURE WILL BE HELD FOR 5 DAYS BEFORE ISSUING A FINAL NEGATIVE REPORT     Performed at [facility]   Report Status PENDING   Incomplete  CULTURE, BLOOD (ROUTINE X 2)     Status: None   Collection Time    08/14/13  5:16 AM      Result Value Range Status   Specimen Description BLOOD LEFT HAND   Final   Special Requests BOTTLES DRAWN AEROBIC AND ANAEROBIC 3CC EA   Final   Culture  Setup Time     Final   Value: 08/14/2013 08:57     Performed at [facility]   Culture     Final   Value:        BLOOD CULTURE RECEIVED NO GROWTH TO DATE CULTURE WILL BE HELD FOR 5 DAYS BEFORE ISSUING A FINAL NEGATIVE REPORT     Performed at [facility]   Report Status PENDING   Incomplete  URINE CULTURE     Status: None   Collection Time    08/14/13  8:46 AM      Result Value Range Status   Specimen Description URINE, RANDOM   Final   Special Requests NONE   Final   Culture  Setup Time     Final   Value: 08/14/2013 10:08     Performed at [facility] Count     Final   Value: >=100,000 COLONIES/ML     Performed at [facility]   Culture     Final   Value: PSEUDOMONAS AERUGINOSA     Performed at [facility]   Report Status PENDING   Incomplete     Studies: No results found.  Scheduled Meds: . diphenhydrAMINE  25 mg Oral Once  . feeding supplement (ENSURE COMPLETE)  237 mL Oral TID BM  . fluticasone  1 spray  Each Nare Daily  . folic acid  1 mg Oral Daily  . heparin  5,000 Units Subcutaneous Q8H  . HYDROmorphone PCA 0.3 mg/mL   Intravenous Q4H  . morphine  60 mg Oral BID  . multivitamin with minerals  1 tablet Oral Daily  . naproxen  500 mg Oral BID WC  . pantoprazole  40 mg Oral Daily  . senna-docusate  1 tablet Oral BID  . sodium chloride  10-40 mL Intracatheter Q12H   Continuous Infusions: . sodium chloride 30 mL/hr at 08/14/13 2213    Principal Problem:   Sickle cell pain crisis Active Problems:   Sickle cell anemia   Fracture of phalanx of left ring finger   Time spent: 25  Pamella Pert, MD [facility] Pager [phone]. If 7 PM - 7 AM, please contact night-coverage at [email or web address], password [facility] 08/16/2013, 8:13 AM  LOS: 9 days

## 2013-08-17 DIAGNOSIS — B965 Pseudomonas (aeruginosa) (mallei) (pseudomallei) as the cause of diseases classified elsewhere: Secondary | ICD-10-CM

## 2013-08-17 LAB — URINE CULTURE

## 2013-08-17 LAB — CBC
HCT: 14.3 % — ABNORMAL LOW (ref 39.0–52.0)
Hemoglobin: 4.9 g/dL — CL (ref 13.0–17.0)
MCH: 32.7 pg (ref 26.0–34.0)
MCHC: 34.3 g/dL (ref 30.0–36.0)

## 2013-08-17 MED ORDER — CIPROFLOXACIN HCL 500 MG PO TABS
500.0000 mg | ORAL_TABLET | Freq: Two times a day (BID) | ORAL | Status: DC
  Administered 2013-08-17 – 2013-08-28 (×23): 500 mg via ORAL
  Filled 2013-08-17 (×27): qty 1

## 2013-08-17 NOTE — Progress Notes (Signed)
Crisis slow to break Pseudomonas growing in urine antibiotics started Hemoglobin 4.9 today little change from 5.1 yesterday and I would hold on transfusion for now. I will be away at a medical convention. Please call my service if any assistance needed. 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 Status Discussed with hospital attending.

## 2013-08-17 NOTE — Progress Notes (Signed)
[facility] PROGRESS NOTE   Dennis Bernard ZOX:[id number] DOB: Feb 12, 1986 DOA: 08/07/2013 PCP: No primary provider on file.  Assessment/Plan: Sickle cell pain and crisis  - Continue current management with LA morphine and Dilaudid PCA. CXR negative for findings concerning for acute chest at this time.  - Has been transfused 3 units of packed red blood cells so far; Hemoglobin 4.9 this morning, overall stable, will not transfuse for now. Case discussed this morning with Dr. Cyndie Chime.  - pRBC >> please note that patient will need to be premedicated with Solu-Medrol 40 IV, Tylenol 650 by mouth and Benadryl 50 by mouth prior to all transfusions  - continue scheduled Naprosyn and oxycodone when necessary for pain control.  - Continue Benadryl when necessary  Pseudomonas urinary tract infection - Urine culture is growing pseudomonas aeruginosa greater than 10 to the fifth colonies per mL. This is somewhat surprising given negative initial urinalysis. Patient does have some symptoms of burning with urination and has noticed his urine has been darker. Will go ahead and treat, and this is a complicated urinary tract infection in this male patient. - - on Cefpime 12/3, sensitivities available on 12/4, transitioned to oral Ciprofloxacin Fracture of phalanx of left ring finger  Continue immobilization and Pain control.  -repeat x-ray per Dr Amanda Pea 11/30 still shows comminuted mildly displaced intra-articular fracture of the fourth middle phalanx  - Dr Amanda Pea planning on external fixation procedure>> His Hb is stable for him for now, it would be OK for fixation - Appreciate the assistance  Fever/Leukocytosis  - Multifactorial due to #1 and also UTI, also delayed transfusion reaction - chest x-ray on admission with no airspace disease - Admission blood cultures no growth to date  - Chest x-ray 12/1 with increased very hilar markings and central peribronchial thickening consistent with  bronchitis and or edema   Diet: Regular diet Fluids: Normal saline at 30 cc an hour DVT Prophylaxis: Heparin  Code Status: Full code Family Communication: None  Disposition Plan: Inpatient, home when medically ready  Consultants:  Oncology  Orthopedic surgery  Procedures:  none   Antibiotics  Anti-infectives   Start     Dose/Rate Route Frequency Ordered Stop   08/16/13 0900  ceFEPIme (MAXIPIME) 2 g in dextrose 5 % 50 mL IVPB     2 g 100 mL/hr over 30 Minutes Intravenous Every 12 hours 08/16/13 0836     08/14/13 1400  azithromycin (ZITHROMAX) tablet 500 mg  Status:  Discontinued     500 mg Oral Daily 08/14/13 1241 08/16/13 0813     Antibiotics Given (last 72 hours)   Date/Time Action Medication Dose Rate   08/14/13 1349 Given   azithromycin (ZITHROMAX) tablet 500 mg 500 mg    08/15/13 0925 Given   azithromycin (ZITHROMAX) tablet 500 mg 500 mg    08/16/13 8119 Given   ceFEPIme (MAXIPIME) 2 g in dextrose 5 % 50 mL IVPB 2 g 100 mL/hr   08/16/13 2241 Given   ceFEPIme (MAXIPIME) 2 g in dextrose 5 % 50 mL IVPB 2 g 100 mL/hr      HPI/Subjective: - Continues to complain of pain typical for a sickle crisis  Objective: Filed Vitals:   08/17/13 0012 08/17/13 0418 08/17/13 0505 08/17/13 0757  BP:   99/52   Pulse:   118   Temp:   98.7 F (37.1 C)   TempSrc:   Oral   Resp: 20 21 18 18   Height:      Weight:  SpO2: 100% 100% 100% 100%    Intake/Output Summary (Last 24 hours) at 08/17/13 0818 Last data filed at 08/17/13 0700  Gross per 24 hour  Intake 1582.35 ml  Output   2825 ml  Net -1242.65 ml   Filed Weights   08/07/13 1743 08/07/13 2300  Weight: 74.844 kg (165 lb) 80.1 kg (176 lb 9.4 oz)    Exam:   General:  NAD  Cardiovascular: regular rate and rhythm, without MRG  Respiratory: good air movement, clear to auscultation throughout, no wheezing, ronchi or rales  Abdomen: soft, not tender to palpation, positive bowel sounds  MSK: no peripheral  edema  Neuro: Nonfocal  Data Reviewed: Basic Metabolic Panel:  Recent Labs Lab 08/11/13 0448 08/12/13 0350 08/15/13 0513  NA 135 139 134*  K 4.6 5.1 4.9  CL 103 106 101  CO2 24 21 23   GLUCOSE 90 88 91  BUN 17 15 15   CREATININE 0.65 0.70 0.72  CALCIUM 9.0 9.1 9.2   Liver Function Tests:  Recent Labs Lab 08/12/13 0350 08/15/13 0513  AST 31 30  ALT 13 19  ALKPHOS 112 157*  BILITOT 1.9* 2.9*  PROT 7.1 7.1  ALBUMIN 3.5 3.3*   CBC:  Recent Labs Lab 08/14/13 0445 08/15/13 0513 08/16/13 0505 08/16/13 1750 08/17/13 0410  WBC 15.3* 15.0* 14.8* 15.0* 18.8*  HGB 5.2* 4.8* 5.4* 5.1* 4.9*  HCT 15.4* 14.0* 16.0* 15.4* 14.3*  MCV 94.5 94.0 93.0 93.9 95.3  PLT 277 337 376 423* 412*   Recent Results (from the past 240 hour(s))  CULTURE, BLOOD (ROUTINE X 2)     Status: None   Collection Time    08/09/13 12:15 PM      Result Value Range Status   Specimen Description BLOOD LEFT HAND   Final   Special Requests BOTTLES DRAWN AEROBIC ONLY 3CC   Final   Culture  Setup Time     Final   Value: 08/09/2013 14:44     Performed at [facility]   Culture     Final   Value: NO GROWTH 5 DAYS     Performed at [facility]   Report Status 08/15/2013 FINAL   Final  CULTURE, BLOOD (ROUTINE X 2)     Status: None   Collection Time    08/09/13 12:28 PM      Result Value Range Status   Specimen Description BLOOD RIGHT HAND   Final   Special Requests BOTTLES DRAWN AEROBIC ONLY 3CC   Final   Culture  Setup Time     Final   Value: 08/09/2013 14:44     Performed at [facility]   Culture     Final   Value: NO GROWTH 5 DAYS     Performed at [facility]   Report Status 08/15/2013 FINAL   Final  CULTURE, BLOOD (ROUTINE X 2)     Status: None   Collection Time    08/14/13  5:10 AM      Result Value Range Status   Specimen Description BLOOD LEFT HAND   Final   Special Requests BOTTLES DRAWN AEROBIC AND ANAEROBIC 3CC EA   Final   Culture  Setup Time      Final   Value: 08/14/2013 08:57     Performed at [facility]   Culture     Final   Value:        BLOOD CULTURE RECEIVED NO GROWTH TO DATE CULTURE WILL BE HELD  FOR 5 DAYS BEFORE ISSUING A FINAL NEGATIVE REPORT     Performed at [facility]   Report Status PENDING   Incomplete  CULTURE, BLOOD (ROUTINE X 2)     Status: None   Collection Time    08/14/13  5:16 AM      Result Value Range Status   Specimen Description BLOOD LEFT HAND   Final   Special Requests BOTTLES DRAWN AEROBIC AND ANAEROBIC 3CC EA   Final   Culture  Setup Time     Final   Value: 08/14/2013 08:57     Performed at [facility]   Culture     Final   Value:        BLOOD CULTURE RECEIVED NO GROWTH TO DATE CULTURE WILL BE HELD FOR 5 DAYS BEFORE ISSUING A FINAL NEGATIVE REPORT     Performed at [facility]   Report Status PENDING   Incomplete  URINE CULTURE     Status: None   Collection Time    08/14/13  8:46 AM      Result Value Range Status   Specimen Description URINE, RANDOM   Final   Special Requests NONE   Final   Culture  Setup Time     Final   Value: 08/14/2013 10:08     Performed at [facility] Count     Final   Value: >=100,000 COLONIES/ML     Performed at [facility]   Culture     Final   Value: PSEUDOMONAS AERUGINOSA     Performed at [facility]   Report Status 08/17/2013 FINAL   Final   Organism ID, Bacteria PSEUDOMONAS AERUGINOSA   Final     Studies: No results found.  Scheduled Meds: . ceFEPime (MAXIPIME) IV  2 g Intravenous Q12H  . diphenhydrAMINE  25 mg Oral Once  . feeding supplement (ENSURE COMPLETE)  237 mL Oral TID BM  . fluticasone  1 spray Each Nare Daily  . folic acid  1 mg Oral Daily  . heparin  5,000 Units Subcutaneous Q8H  . HYDROmorphone PCA 0.3 mg/mL   Intravenous Q4H  . morphine  60 mg Oral BID  . multivitamin with minerals  1 tablet Oral Daily  . naproxen  500 mg Oral BID WC  . pantoprazole  40 mg Oral  Daily  . senna-docusate  1 tablet Oral BID  . sodium chloride  10-40 mL Intracatheter Q12H   Continuous Infusions: . sodium chloride 30 mL/hr at 08/17/13 1610    Principal Problem:   Sickle cell pain crisis Active Problems:   Sickle cell anemia   Fracture of phalanx of left ring finger  Time spent: 25  Pamella Pert, MD [facility] Pager [phone]. If 7 PM - 7 AM, please contact night-coverage at [email or web address], password [facility] 08/17/2013, 8:18 AM  LOS: 10 days

## 2013-08-18 LAB — BASIC METABOLIC PANEL
Calcium: 9.1 mg/dL (ref 8.4–10.5)
Creatinine, Ser: 0.86 mg/dL (ref 0.50–1.35)
GFR calc Af Amer: >90 mL/min (ref >90)
Potassium: 4.5 mEq/L (ref 3.5–5.1)

## 2013-08-18 LAB — CBC
Hemoglobin: 4.3 g/dL — CL (ref 13.0–17.0)
MCH: 31.4 pg (ref 26.0–34.0)
MCHC: 33.6 g/dL (ref 30.0–36.0)
MCV: 93.4 fL (ref 78.0–100.0)
Platelets: 441 10*3/uL — ABNORMAL HIGH (ref 150–400)
RDW: 17.7 % — ABNORMAL HIGH (ref 11.5–15.5)

## 2013-08-18 MED ORDER — ACETAMINOPHEN 325 MG PO TABS
650.0000 mg | ORAL_TABLET | Freq: Once | ORAL | Status: AC
  Administered 2013-08-18: 11:00:00 650 mg via ORAL
  Filled 2013-08-18: qty 2

## 2013-08-18 MED ORDER — METHYLPREDNISOLONE SODIUM SUCC 40 MG IJ SOLR
40.0000 mg | Freq: Once | INTRAMUSCULAR | Status: AC
  Administered 2013-08-18: 40 mg via INTRAVENOUS
  Filled 2013-08-18: qty 1

## 2013-08-18 MED ORDER — MORPHINE SULFATE ER 30 MG PO TBCR
90.0000 mg | EXTENDED_RELEASE_TABLET | Freq: Two times a day (BID) | ORAL | Status: DC
  Administered 2013-08-18 – 2013-08-28 (×20): 90 mg via ORAL
  Filled 2013-08-18 (×20): qty 3

## 2013-08-18 MED ORDER — HYDROMORPHONE 0.3 MG/ML IV SOLN
INTRAVENOUS | Status: DC
  Administered 2013-08-18: 5.37 mg via INTRAVENOUS
  Administered 2013-08-18: 14.12 mg via INTRAVENOUS
  Administered 2013-08-18 (×2): via INTRAVENOUS
  Administered 2013-08-19: 5.32 mg via INTRAVENOUS
  Administered 2013-08-19: 02:00:00 via INTRAVENOUS
  Administered 2013-08-19: 5.55 mg via INTRAVENOUS
  Administered 2013-08-19: 13:00:00 via INTRAVENOUS
  Administered 2013-08-19: 5.25 mg via INTRAVENOUS
  Filled 2013-08-18 (×5): qty 25

## 2013-08-18 MED ORDER — DIPHENHYDRAMINE HCL 50 MG PO CAPS
50.0000 mg | ORAL_CAPSULE | Freq: Once | ORAL | Status: AC
  Administered 2013-08-18: 50 mg via ORAL
  Filled 2013-08-18: qty 1

## 2013-08-18 NOTE — Progress Notes (Signed)
[facility] PROGRESS NOTE   Dennis Bernard:[id number] DOB: [birth year] DOA: 08/07/2013 PCP: No primary provider on file.  Assessment/Plan: Sickle cell pain and crisis  - Continue current management with LA morphine and Dilaudid PCA. CXR negative for findings concerning for acute chest at this time.  - weaning off his PCA and have increased his oral Morphine dosing.  - Has been transfused 3 units of packed red blood cells so far with yet another Hb drop this morning, will need additional transfusion today.   - patient will need to be premedicated with Solu-Medrol 40 IV, Tylenol 650 by mouth and Benadryl 50 by mouth prior to all transfusions  - continue scheduled Naprosyn and oxycodone when necessary for pain control.  - Continue Benadryl when necessary  Pseudomonas urinary tract infection - Urine culture is growing pseudomonas aeruginosa greater than 10 to the fifth colonies per mL. This is somewhat surprising given negative initial urinalysis. Patient does have some symptoms of burning with urination and has noticed his urine has been darker. Will go ahead and treat, and this is a complicated urinary tract infection in this male patient. - on Cefpime 12/3, sensitivities available on 12/4, transitioned to oral Ciprofloxacin. Stable, improving, Fracture of phalanx of left ring finger  Continue immobilization and Pain control.  -repeat x-ray per Dr Amanda Pea 11/30 still shows comminuted mildly displaced intra-articular fracture of the fourth middle phalanx  - Dr Amanda Pea planning on external fixation procedure>> getting a transfusion today, will hold.  - Appreciate the assistance  Fever/Leukocytosis  - Multifactorial due to #1 and also UTI, also delayed transfusion reaction - chest x-ray on admission with no airspace disease - Admission blood cultures no growth to date  - Chest x-ray 12/1 with increased very hilar markings and central peribronchial thickening consistent with bronchitis  and or edema   Diet: Regular diet Fluids: Normal saline at 30 cc an hour DVT Prophylaxis: Heparin  Code Status: Full code Family Communication: None  Disposition Plan: Inpatient, home when medically ready  Consultants:  Oncology  Orthopedic surgery  Procedures:  none   Antibiotics  Anti-infectives   Start     Dose/Rate Route Frequency Ordered Stop   08/17/13 1000  ciprofloxacin (CIPRO) tablet 500 mg     500 mg Oral 2 times daily 08/17/13 0821     08/16/13 0900  ceFEPIme (MAXIPIME) 2 g in dextrose 5 % 50 mL IVPB  Status:  Discontinued     2 g 100 mL/hr over 30 Minutes Intravenous Every 12 hours 08/16/13 0836 08/17/13 0821   08/14/13 1400  azithromycin (ZITHROMAX) tablet 500 mg  Status:  Discontinued     500 mg Oral Daily 08/14/13 1241 08/16/13 0813     Antibiotics Given (last 72 hours)   Date/Time Action Medication Dose Rate   08/16/13 0907 Given   ceFEPIme (MAXIPIME) 2 g in dextrose 5 % 50 mL IVPB 2 g 100 mL/hr   08/16/13 2241 Given   ceFEPIme (MAXIPIME) 2 g in dextrose 5 % 50 mL IVPB 2 g 100 mL/hr   08/17/13 0900 Given   ciprofloxacin (CIPRO) tablet 500 mg 500 mg    08/17/13 1929 Given   ciprofloxacin (CIPRO) tablet 500 mg 500 mg    08/18/13 0748 Given   ciprofloxacin (CIPRO) tablet 500 mg 500 mg       HPI/Subjective: - ongoing pain, concerned about his Hb drop again. Pain 7/10  Objective: Filed Vitals:   08/18/13 0909 08/18/13 1118 08/18/13 1150 08/18/13 1250  BP:  98/58 97/57 92/57   Pulse:  98 80   Temp:  98.8 F (37.1 C) 98.6 F (37 C) 98.6 F (37 C)  TempSrc:  Oral Oral Oral  Resp: 18 18 18 18   Height:      Weight:      SpO2: 100% 100% 97% 100%    Intake/Output Summary (Last 24 hours) at 08/18/13 1335 Last data filed at 08/18/13 1610  Gross per 24 hour  Intake 504.85 ml  Output   1725 ml  Net -1220.15 ml   Filed Weights   08/07/13 1743 08/07/13 2300  Weight: 74.844 kg (165 lb) 80.1 kg (176 lb 9.4 oz)    Exam:   General:   NAD  Cardiovascular: regular rate and rhythm, without MRG  Respiratory: good air movement, clear to auscultation throughout, no wheezing, ronchi or rales  Abdomen: soft, not tender to palpation, positive bowel sounds  MSK: no peripheral edema  Neuro: Nonfocal  Data Reviewed: Basic Metabolic Panel:  Recent Labs Lab 08/12/13 0350 08/15/13 0513 08/18/13 0443  NA 139 134* 137  K 5.1 4.9 4.5  CL 106 101 103  CO2 21 23 24   GLUCOSE 88 91 103*  BUN 15 15 18   CREATININE 0.70 0.72 0.86  CALCIUM 9.1 9.2 9.1   Liver Function Tests:  Recent Labs Lab 08/12/13 0350 08/15/13 0513  AST 31 30  ALT 13 19  ALKPHOS 112 157*  BILITOT 1.9* 2.9*  PROT 7.1 7.1  ALBUMIN 3.5 3.3*   CBC:  Recent Labs Lab 08/15/13 0513 08/16/13 0505 08/16/13 1750 08/17/13 0410 08/18/13 0443  WBC 15.0* 14.8* 15.0* 18.8* 17.7*  HGB 4.8* 5.4* 5.1* 4.9* 4.3*  HCT 14.0* 16.0* 15.4* 14.3* 12.8*  MCV 94.0 93.0 93.9 95.3 93.4  PLT 337 376 423* 412* 441*   Recent Results (from the past 240 hour(s))  CULTURE, BLOOD (ROUTINE X 2)     Status: None   Collection Time    08/09/13 12:15 PM      Result Value Range Status   Specimen Description BLOOD LEFT HAND   Final   Special Requests BOTTLES DRAWN AEROBIC ONLY 3CC   Final   Culture  Setup Time     Final   Value: 08/09/2013 14:44     Performed at [facility]   Culture     Final   Value: NO GROWTH 5 DAYS     Performed at [facility]   Report Status 08/15/2013 FINAL   Final  CULTURE, BLOOD (ROUTINE X 2)     Status: None   Collection Time    08/09/13 12:28 PM      Result Value Range Status   Specimen Description BLOOD RIGHT HAND   Final   Special Requests BOTTLES DRAWN AEROBIC ONLY 3CC   Final   Culture  Setup Time     Final   Value: 08/09/2013 14:44     Performed at [facility]   Culture     Final   Value: NO GROWTH 5 DAYS     Performed at [facility]   Report Status 08/15/2013 FINAL   Final  CULTURE, BLOOD  (ROUTINE X 2)     Status: None   Collection Time    08/14/13  5:10 AM      Result Value Range Status   Specimen Description BLOOD LEFT HAND   Final   Special Requests BOTTLES DRAWN AEROBIC AND ANAEROBIC 3CC EA   Final   Culture  Setup  Time     Final   Value: 08/14/2013 08:57     Performed at [facility]   Culture     Final   Value:        BLOOD CULTURE RECEIVED NO GROWTH TO DATE CULTURE WILL BE HELD FOR 5 DAYS BEFORE ISSUING A FINAL NEGATIVE REPORT     Performed at [facility]   Report Status PENDING   Incomplete  CULTURE, BLOOD (ROUTINE X 2)     Status: None   Collection Time    08/14/13  5:16 AM      Result Value Range Status   Specimen Description BLOOD LEFT HAND   Final   Special Requests BOTTLES DRAWN AEROBIC AND ANAEROBIC 3CC EA   Final   Culture  Setup Time     Final   Value: 08/14/2013 08:57     Performed at [facility]   Culture     Final   Value:        BLOOD CULTURE RECEIVED NO GROWTH TO DATE CULTURE WILL BE HELD FOR 5 DAYS BEFORE ISSUING A FINAL NEGATIVE REPORT     Performed at [facility]   Report Status PENDING   Incomplete  URINE CULTURE     Status: None   Collection Time    08/14/13  8:46 AM      Result Value Range Status   Specimen Description URINE, RANDOM   Final   Special Requests NONE   Final   Culture  Setup Time     Final   Value: 08/14/2013 10:08     Performed at [facility] Count     Final   Value: >=100,000 COLONIES/ML     Performed at [facility]   Culture     Final   Value: PSEUDOMONAS AERUGINOSA     Performed at [facility]   Report Status 08/17/2013 FINAL   Final   Organism ID, Bacteria PSEUDOMONAS AERUGINOSA   Final     Studies: No results found.  Scheduled Meds: . ciprofloxacin  500 mg Oral BID  . feeding supplement (ENSURE COMPLETE)  237 mL Oral TID BM  . fluticasone  1 spray Each Nare Daily  . folic acid  1 mg Oral Daily  . heparin  5,000 Units Subcutaneous  Q8H  . HYDROmorphone PCA 0.3 mg/mL   Intravenous Q4H  . morphine  90 mg Oral BID  . multivitamin with minerals  1 tablet Oral Daily  . naproxen  500 mg Oral BID WC  . pantoprazole  40 mg Oral Daily  . senna-docusate  1 tablet Oral BID  . sodium chloride  10-40 mL Intracatheter Q12H   Continuous Infusions: . sodium chloride 20 mL/hr at 08/17/13 1610    Principal Problem:   Sickle cell pain crisis Active Problems:   Sickle cell anemia   Fracture of phalanx of left ring finger  Time spent: 25  Pamella Pert, MD [facility] Pager [phone]. If 7 PM - 7 AM, please contact night-coverage at [email or web address], password [facility] 08/18/2013, 1:35 PM  LOS: 11 days

## 2013-08-19 LAB — CBC
MCH: 31.8 pg (ref 26.0–34.0)
MCV: 93.8 fL (ref 78.0–100.0)
Platelets: 457 10*3/uL — ABNORMAL HIGH (ref 150–400)
RDW: 18.6 % — ABNORMAL HIGH (ref 11.5–15.5)
WBC: 21.8 10*3/uL — ABNORMAL HIGH (ref 4.0–10.5)

## 2013-08-19 LAB — TYPE AND SCREEN: Unit division: 0

## 2013-08-19 MED ORDER — HYDROMORPHONE 0.3 MG/ML IV SOLN
INTRAVENOUS | Status: DC
  Administered 2013-08-19: 4.49 mg via INTRAVENOUS
  Administered 2013-08-20 (×2): via INTRAVENOUS
  Administered 2013-08-20: 5.46 mg via INTRAVENOUS
  Administered 2013-08-20: 4 mg via INTRAVENOUS
  Administered 2013-08-20: 2 mg via INTRAVENOUS
  Administered 2013-08-20: 3.64 mg via INTRAVENOUS
  Administered 2013-08-20: 6 mg via INTRAVENOUS
  Administered 2013-08-20: 03:00:00 via INTRAVENOUS
  Administered 2013-08-20: 1 mg via INTRAVENOUS
  Administered 2013-08-20: 8.98 mg via INTRAVENOUS
  Administered 2013-08-20: 1 mg via INTRAVENOUS
  Administered 2013-08-20: 4.99 mg via INTRAVENOUS
  Administered 2013-08-21: 06:00:00 via INTRAVENOUS
  Administered 2013-08-21: 4.99 mg via INTRAVENOUS
  Filled 2013-08-19 (×7): qty 25

## 2013-08-19 MED ORDER — METHYLPREDNISOLONE SODIUM SUCC 40 MG IJ SOLR
40.0000 mg | Freq: Once | INTRAMUSCULAR | Status: DC
  Filled 2013-08-19: qty 1

## 2013-08-19 MED ORDER — DIPHENHYDRAMINE HCL 50 MG PO CAPS: 50.0000 mg | ORAL_CAPSULE | Freq: Once | ORAL | Status: DC

## 2013-08-19 MED ORDER — ACETAMINOPHEN 325 MG PO TABS: 650.0000 mg | ORAL_TABLET | Freq: Once | ORAL | Status: DC

## 2013-08-19 NOTE — Progress Notes (Signed)
[facility] PROGRESS NOTE   Dennis Bernard GNF:[id number] DOB: 06-Oct-1985 DOA: 08/07/2013 PCP: No primary provider on file.  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  - Hb with drop to 4.1 this morning. Patient feels weak with ambulation and dyspneic, related to his anemia. Goal Hb for him is > 5. Will try and transfuse 2 more units today given his symptomatic anemia Sickle cell pain and crisis  - Continue current management with LA morphine and Dilaudid PCA. CXR negative for findings concerning for acute chest at this time.  - weaning off his PCA again today and continue oral Morphine dosing.  - Has been transfused 4 units of packed red blood cells so far with yet another Hb drop this morning - patient will need to be premedicated with Solu-Medrol 40 IV, Tylenol 650 by mouth and Benadryl 50 by mouth prior to all transfusions  - continue scheduled Naprosyn and oxycodone when necessary for pain control.  - Continue Benadryl when necessary  Pseudomonas urinary tract infection - Urine culture is growing pseudomonas aeruginosa greater than 10 to the fifth colonies per mL. This is somewhat surprising given negative initial urinalysis. Patient does have some symptoms of burning with urination and has noticed his urine has been darker. Will go ahead and treat, and this is a complicated urinary tract infection in this male patient. - on Cefpime 12/3, sensitivities available on 12/4, transitioned to oral Ciprofloxacin. Stable, improving, Fracture of phalanx of left ring finger  Continue immobilization and Pain control.  -repeat x-ray per Dr Amanda Pea 11/30 still shows comminuted mildly displaced intra-articular fracture of the fourth middle phalanx  - Dr Amanda Pea planning on external fixation procedure once Hb stable - Appreciate the assistance  Fever/Leukocytosis  -  Multifactorial due to #1 and also UTI, also delayed transfusion reaction - chest x-ray on admission with no airspace disease - Admission blood cultures no growth to date  - Chest x-ray 12/1 with increased very hilar markings and central peribronchial thickening consistent with bronchitis and or edema   Diet: Regular diet Fluids: Normal saline DVT Prophylaxis: Heparin  Code Status: Full code Family Communication: None  Disposition Plan: Inpatient, home when medically ready  Consultants:  Oncology  Orthopedic surgery  Procedures:  none   Antibiotics  Anti-infectives   Start     Dose/Rate Route Frequency Ordered Stop   08/17/13 1000  ciprofloxacin (CIPRO) tablet 500 mg     500 mg Oral 2 times daily 08/17/13 0821     08/16/13 0900  ceFEPIme (MAXIPIME) 2 g in dextrose 5 % 50 mL IVPB  Status:  Discontinued     2 g 100 mL/hr over 30 Minutes Intravenous Every 12 hours 08/16/13 0836 08/17/13 0821   08/14/13 1400  azithromycin (ZITHROMAX) tablet 500 mg  Status:  Discontinued     500 mg Oral Daily 08/14/13 1241 08/16/13 0813     Antibiotics Given (last 72 hours)   Date/Time Action Medication Dose Rate   08/16/13 2241 Given   ceFEPIme (MAXIPIME) 2 g in dextrose 5 % 50 mL IVPB 2 g 100 mL/hr   08/17/13 0900 Given   ciprofloxacin (CIPRO) tablet 500 mg 500 mg    08/17/13 1929 Given   ciprofloxacin (CIPRO) tablet 500 mg 500 mg    08/18/13 0748 Given   ciprofloxacin (CIPRO) tablet 500 mg 500 mg    08/18/13 1953 Given   ciprofloxacin (CIPRO)  tablet 500 mg 500 mg    08/19/13 0941 Given   ciprofloxacin (CIPRO) tablet 500 mg 500 mg       HPI/Subjective: - ongoing pain but thinks he can tolerate further decrease of his PCA settings.  Objective: Filed Vitals:   08/19/13 0400 08/19/13 0425 08/19/13 0756 08/19/13 1409  BP:  103/66  91/48  Pulse:  84  76  Temp:  97.8 F (36.6 C)  98.1 F (36.7 C)  TempSrc:  Oral  Oral  Resp: 18 18 20 18   Height:      Weight:      SpO2: 97%  100%  100%    Intake/Output Summary (Last 24 hours) at 08/19/13 1521 Last data filed at 08/19/13 0900  Gross per 24 hour  Intake 727.96 ml  Output   2275 ml  Net -1547.04 ml   Filed Weights   08/07/13 1743 08/07/13 2300  Weight: 74.844 kg (165 lb) 80.1 kg (176 lb 9.4 oz)    Exam:   General:  NAD  Cardiovascular: regular rate and rhythm, without MRG  Respiratory: good air movement, clear to auscultation throughout, no wheezing, ronchi or rales  Abdomen: soft, not tender to palpation, positive bowel sounds  MSK: no peripheral edema  Neuro: Nonfocal  Data Reviewed: Basic Metabolic Panel:  Recent Labs Lab 08/15/13 0513 08/18/13 0443  NA 134* 137  K 4.9 4.5  CL 101 103  CO2 23 24  GLUCOSE 91 103*  BUN 15 18  CREATININE 0.72 0.86  CALCIUM 9.2 9.1   Liver Function Tests:  Recent Labs Lab 08/15/13 0513  AST 30  ALT 19  ALKPHOS 157*  BILITOT 2.9*  PROT 7.1  ALBUMIN 3.3*   CBC:  Recent Labs Lab 08/16/13 0505 08/16/13 1750 08/17/13 0410 08/18/13 0443 08/19/13 0615  WBC 14.8* 15.0* 18.8* 17.7* 21.8*  HGB 5.4* 5.1* 4.9* 4.3* 4.1*  HCT 16.0* 15.4* 14.3* 12.8* 12.1*  MCV 93.0 93.9 95.3 93.4 93.8  PLT 376 423* 412* 441* 457*   Recent Results (from the past 240 hour(s))  CULTURE, BLOOD (ROUTINE X 2)     Status: None   Collection Time    08/14/13  5:10 AM      Result Value Range Status   Specimen Description BLOOD LEFT HAND   Final   Special Requests BOTTLES DRAWN AEROBIC AND ANAEROBIC 3CC EA   Final   Culture  Setup Time     Final   Value: 08/14/2013 08:57     Performed at [facility]   Culture     Final   Value:        BLOOD CULTURE RECEIVED NO GROWTH TO DATE CULTURE WILL BE HELD FOR 5 DAYS BEFORE ISSUING A FINAL NEGATIVE REPORT     Performed at [facility]   Report Status PENDING   Incomplete  CULTURE, BLOOD (ROUTINE X 2)     Status: None   Collection Time    08/14/13  5:16 AM      Result Value Range Status   Specimen  Description BLOOD LEFT HAND   Final   Special Requests BOTTLES DRAWN AEROBIC AND ANAEROBIC 3CC EA   Final   Culture  Setup Time     Final   Value: 08/14/2013 08:57     Performed at [facility]   Culture     Final   Value:        BLOOD CULTURE RECEIVED NO GROWTH TO DATE CULTURE WILL BE HELD  FOR 5 DAYS BEFORE ISSUING A FINAL NEGATIVE REPORT     Performed at [facility]   Report Status PENDING   Incomplete  URINE CULTURE     Status: None   Collection Time    08/14/13  8:46 AM      Result Value Range Status   Specimen Description URINE, RANDOM   Final   Special Requests NONE   Final   Culture  Setup Time     Final   Value: 08/14/2013 10:08     Performed at [facility] Count     Final   Value: >=100,000 COLONIES/ML     Performed at [facility]   Culture     Final   Value: PSEUDOMONAS AERUGINOSA     Performed at [facility]   Report Status 08/17/2013 FINAL   Final   Organism ID, Bacteria PSEUDOMONAS AERUGINOSA   Final     Studies: No results found.  Scheduled Meds: . ciprofloxacin  500 mg Oral BID  . feeding supplement (ENSURE COMPLETE)  237 mL Oral TID BM  . fluticasone  1 spray Each Nare Daily  . folic acid  1 mg Oral Daily  . heparin  5,000 Units Subcutaneous Q8H  . HYDROmorphone PCA 0.3 mg/mL   Intravenous Q4H  . morphine  90 mg Oral BID  . multivitamin with minerals  1 tablet Oral Daily  . naproxen  500 mg Oral BID WC  . pantoprazole  40 mg Oral Daily  . senna-docusate  1 tablet Oral BID  . sodium chloride  10-40 mL Intracatheter Q12H   Continuous Infusions: . sodium chloride 20 mL/hr at 08/19/13 0243    Principal Problem:   Sickle cell pain crisis Active Problems:   Sickle cell anemia   Fracture of phalanx of left ring finger  Time spent: 25  Pamella Pert, MD [facility] Pager [phone]. If 7 PM - 7 AM, please contact night-coverage at [email or web address], password [email or web address] 08/19/2013, 3:21 PM  LOS: 12 days

## 2013-08-20 ENCOUNTER — Inpatient Hospital Stay (HOSPITAL_COMMUNITY): Payer: Medicare Other

## 2013-08-20 LAB — CULTURE, BLOOD (ROUTINE X 2): Culture: NO GROWTH

## 2013-08-20 LAB — HEPATIC FUNCTION PANEL
ALT: 26 U/L (ref 0–53)
AST: 26 U/L (ref 0–37)
Albumin: 3.6 g/dL (ref 3.5–5.2)
Alkaline Phosphatase: 222 U/L — ABNORMAL HIGH (ref 39–117)
Bilirubin, Direct: 0.9 mg/dL — ABNORMAL HIGH (ref 0.0–0.3)
Total Bilirubin: 2.6 mg/dL — ABNORMAL HIGH (ref 0.3–1.2)

## 2013-08-20 LAB — LACTATE DEHYDROGENASE: LDH: 417 U/L — ABNORMAL HIGH (ref 94–250)

## 2013-08-20 LAB — RETICULOCYTES: RBC.: 1.44 MIL/uL — ABNORMAL LOW (ref 4.22–5.81)

## 2013-08-20 LAB — CBC
HCT: 14.2 % — ABNORMAL LOW (ref 39.0–52.0)
Hemoglobin: 4.6 g/dL — CL (ref 13.0–17.0)
MCV: 98.6 fL (ref 78.0–100.0)

## 2013-08-20 MED ORDER — SODIUM CHLORIDE 0.45 % IV SOLN
INTRAVENOUS | Status: AC
  Administered 2013-08-20: 08:00:00 via INTRAVENOUS

## 2013-08-20 NOTE — Progress Notes (Signed)
Patient ID: Dennis Bernard, male   DOB: [birth year], 27 y.o.   MRN: [id number] Patient seen and examined  Patient has a host of medical issues.  His hemoglobin is still below 5. He still has significant fatigue and has positive urine culture is noted.  At present time I feel given all issues I would recommend a repeat radiograph of the affected PIP joint and if there is no change in the alignment perhaps a conservative route.  I performed traction examination today and did not feel any crepitance or gross instability.  Given the significant challenges Dennis Bernard has before him I feel that a cautious approach is most prudent  I sat down at bedside and discussed these issues with him.  Given the timeframe duration from the injury I would favor a conservative route if x-rays are satisfactory and the patient understands this. Unfortunately it we are nearing the point where dynamic external fixation is likely not going to dramatically affect the outcome the fracture is stable  All questions have been encouraged and answered  Roper Tolson M.D.

## 2013-08-20 NOTE — Progress Notes (Signed)
Received call from blood bank stating that patient had not been typed and screened per order this AM.  Order has been in since 445am this morning.  Blood bank to notify phlebotomy.  Will continue to monitor.

## 2013-08-20 NOTE — Progress Notes (Addendum)
Patient's blood transfusion to be held for today due to 'least incompatible' blood from blood bank being too risky to administer to patient, per Dr. Reece Agar.  Patient informed of change in plan of care.  Orders for CBC with differential placed for tomorrow morning.  Patient stable, alert, comfortable and watching tv in room.  Will continue to monitor.

## 2013-08-20 NOTE — Progress Notes (Signed)
Got a critical value on Hgb=4.6, which is improved since yesterday of 4.1, without receiving the blood yet. Did not notify the physcial or on call person, due to the fact they are aware of antibodies in blood typing & crossmatching units for transfusion, & that the transfusion of the 2 Units will be given when blood is available from the lab. I was notified from the lab at 04:40am

## 2013-08-20 NOTE — Progress Notes (Signed)
[facility] PROGRESS NOTE   Dennis Bernard GNF:[id number] DOB: 1986/03/07 DOA: 08/07/2013 PCP: No primary provider on file.  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  - Hb < 5, new units in the blood bank even less compatible than previous units. Some improvement on his own overnight from 4.1 to 4.6, will hold transfusing further for now. It does not seem to be helping.  Sickle cell pain and crisis  - high reticulocytes this morning - Continue current management with LA morphine and Dilaudid PCA. CXR negative for findings concerning for acute chest at this time, repeated CXR 12/7 - weaning off his PCA again as tolerated and continue oral Morphine dosing.  - Has been transfused 4 units of packed red blood cells so far - patient will need to be premedicated with Solu-Medrol 40 IV, Tylenol 650 by mouth and Benadryl 50 by mouth prior to all transfusions  - continue scheduled Naprosyn and oxycodone when necessary for pain control.  - Continue Benadryl when necessary  Pseudomonas urinary tract infection - Urine culture is growing pseudomonas aeruginosa greater than 10 to the fifth colonies per mL. This is somewhat surprising given negative initial urinalysis. Patient does have some symptoms of burning with urination and has noticed his urine has been darker. Will go ahead and treat, and this is a complicated urinary tract infection in this male patient. - on Cefpime 12/3, sensitivities available on 12/4, transitioned to oral Ciprofloxacin. Stable, improving. Will need a 14-day course Fracture of phalanx of left ring finger  Continue immobilization and Pain control.  -repeat x-ray per Dr Amanda Pea 11/30 still shows comminuted mildly displaced intra-articular fracture of the fourth middle phalanx  - Dr Amanda Pea planning on external fixation procedure once Hb stable -  Appreciate the assistance  - if Hb stable, would be OK to do it early next week. Fever/Leukocytosis  - Multifactorial due to #1 and also UTI, also delayed transfusion reaction - chest x-ray on admission with no airspace disease - Admission blood cultures no growth to date  - Chest x-ray 12/1 with increased very hilar markings and central peribronchial thickening consistent with bronchitis and or edema  - CXR 12/7 with similar appearance of chronic interstitial coarsening without any acute findings.  Diet: Regular diet Fluids: Normal saline DVT Prophylaxis: Heparin  Code Status: Full code Family Communication: None  Disposition Plan: Inpatient, home when medically ready  Consultants:  Oncology  Orthopedic surgery  Procedures:  none   Antibiotics  Anti-infectives   Start     Dose/Rate Route Frequency Ordered Stop   08/17/13 1000  ciprofloxacin (CIPRO) tablet 500 mg     500 mg Oral 2 times daily 08/17/13 0821     08/16/13 0900  ceFEPIme (MAXIPIME) 2 g in dextrose 5 % 50 mL IVPB  Status:  Discontinued     2 g 100 mL/hr over 30 Minutes Intravenous Every 12 hours 08/16/13 0836 08/17/13 0821   08/14/13 1400  azithromycin (ZITHROMAX) tablet 500 mg  Status:  Discontinued     500 mg Oral Daily 08/14/13 1241 08/16/13 0813     Antibiotics Given (last 72 hours)   Date/Time Action Medication Dose   08/17/13 1929 Given   ciprofloxacin (CIPRO) tablet 500 mg 500 mg   08/18/13 0748 Given   ciprofloxacin (CIPRO) tablet 500 mg 500 mg   08/18/13 1953 Given   ciprofloxacin (CIPRO) tablet 500 mg 500 mg  08/19/13 0941 Given   ciprofloxacin (CIPRO) tablet 500 mg 500 mg   08/19/13 2119 Given   ciprofloxacin (CIPRO) tablet 500 mg 500 mg   08/20/13 1610 Given   ciprofloxacin (CIPRO) tablet 500 mg 500 mg      HPI/Subjective: - His pain level is about a 7/10  Objective: Filed Vitals:   08/20/13 0000 08/20/13 0252 08/20/13 0400 08/20/13 0500  BP:    105/72  Pulse:    89  Temp:     98.5 F (36.9 C)  TempSrc:    Oral  Resp: 16 18 18 17   Height:      Weight:      SpO2: 100% 99% 99% 100%    Intake/Output Summary (Last 24 hours) at 08/20/13 1018 Last data filed at 08/20/13 0900  Gross per 24 hour  Intake 1739.71 ml  Output   2350 ml  Net -610.29 ml   Filed Weights   08/07/13 1743 08/07/13 2300  Weight: 74.844 kg (165 lb) 80.1 kg (176 lb 9.4 oz)    Exam:   General:  NAD  Cardiovascular: regular rate and rhythm, without MRG  Respiratory: good air movement, clear to auscultation throughout, no wheezing, ronchi or rales  Abdomen: soft, not tender to palpation, positive bowel sounds  MSK: no peripheral edema  Neuro: Nonfocal  Data Reviewed: Basic Metabolic Panel:  Recent Labs Lab 08/15/13 0513 08/18/13 0443  NA 134* 137  K 4.9 4.5  CL 101 103  CO2 23 24  GLUCOSE 91 103*  BUN 15 18  CREATININE 0.72 0.86  CALCIUM 9.2 9.1   Liver Function Tests:  Recent Labs Lab 08/15/13 0513 08/20/13 0348  AST 30 26  ALT 19 26  ALKPHOS 157* 222*  BILITOT 2.9* 2.6*  PROT 7.1 7.9  ALBUMIN 3.3* 3.6   CBC:  Recent Labs Lab 08/16/13 1750 08/17/13 0410 08/18/13 0443 08/19/13 0615 08/20/13 0348  WBC 15.0* 18.8* 17.7* 21.8* 25.7*  HGB 5.1* 4.9* 4.3* 4.1* 4.6*  HCT 15.4* 14.3* 12.8* 12.1* 14.2*  MCV 93.9 95.3 93.4 93.8 98.6  PLT 423* 412* 441* 457* 608*   Recent Results (from the past 240 hour(s))  CULTURE, BLOOD (ROUTINE X 2)     Status: None   Collection Time    08/14/13  5:10 AM      Result Value Range Status   Specimen Description BLOOD LEFT HAND   Final   Special Requests BOTTLES DRAWN AEROBIC AND ANAEROBIC 3CC EA   Final   Culture  Setup Time     Final   Value: 08/14/2013 08:57     Performed at [facility]   Culture     Final   Value:        BLOOD CULTURE RECEIVED NO GROWTH TO DATE CULTURE WILL BE HELD FOR 5 DAYS BEFORE ISSUING A FINAL NEGATIVE REPORT     Performed at [facility]   Report Status PENDING    Incomplete  CULTURE, BLOOD (ROUTINE X 2)     Status: None   Collection Time    08/14/13  5:16 AM      Result Value Range Status   Specimen Description BLOOD LEFT HAND   Final   Special Requests BOTTLES DRAWN AEROBIC AND ANAEROBIC 3CC EA   Final   Culture  Setup Time     Final   Value: 08/14/2013 08:57     Performed at [facility]   Culture     Final   Value:  BLOOD CULTURE RECEIVED NO GROWTH TO DATE CULTURE WILL BE HELD FOR 5 DAYS BEFORE ISSUING A FINAL NEGATIVE REPORT     Performed at [facility]   Report Status PENDING   Incomplete  URINE CULTURE     Status: None   Collection Time    08/14/13  8:46 AM      Result Value Range Status   Specimen Description URINE, RANDOM   Final   Special Requests NONE   Final   Culture  Setup Time     Final   Value: 08/14/2013 10:08     Performed at [facility] Count     Final   Value: >=100,000 COLONIES/ML     Performed at [facility]   Culture     Final   Value: PSEUDOMONAS AERUGINOSA     Performed at [facility]   Report Status 08/17/2013 FINAL   Final   Organism ID, Bacteria PSEUDOMONAS AERUGINOSA   Final     Studies: No results found.  Scheduled Meds: . acetaminophen  650 mg Oral Once  . ciprofloxacin  500 mg Oral BID  . diphenhydrAMINE  50 mg Oral Once  . feeding supplement (ENSURE COMPLETE)  237 mL Oral TID BM  . fluticasone  1 spray Each Nare Daily  . folic acid  1 mg Oral Daily  . heparin  5,000 Units Subcutaneous Q8H  . HYDROmorphone PCA 0.3 mg/mL   Intravenous Q4H  . methylPREDNISolone (SOLU-MEDROL) injection  40 mg Intravenous Once  . morphine  90 mg Oral BID  . multivitamin with minerals  1 tablet Oral Daily  . naproxen  500 mg Oral BID WC  . pantoprazole  40 mg Oral Daily  . senna-docusate  1 tablet Oral BID  . sodium chloride  10-40 mL Intracatheter Q12H   Continuous Infusions: . sodium chloride 100 mL/hr at 08/20/13 0825    Principal Problem:   Sickle  cell pain crisis Active Problems:   Sickle cell anemia   Fracture of phalanx of left ring finger  Time spent: 25   Pamella Pert, MD [facility] Pager [phone]. If 7 PM - 7 AM, please contact night-coverage at [email or web address], password [facility] 08/20/2013, 10:18 AM  LOS: 13 days

## 2013-08-21 LAB — CBC WITH DIFFERENTIAL/PLATELET
Basophils Absolute: 0.2 10*3/uL — ABNORMAL HIGH (ref 0.0–0.1)
Basophils Relative: 1 % (ref 0–1)
Eosinophils Relative: 2 % (ref 0–5)
HCT: 14.8 % — ABNORMAL LOW (ref 39.0–52.0)
Hemoglobin: 4.7 g/dL — CL (ref 13.0–17.0)
Lymphocytes Relative: 26 % (ref 12–46)
Lymphs Abs: 6.3 10*3/uL — ABNORMAL HIGH (ref 0.7–4.0)
MCHC: 31.8 g/dL (ref 30.0–36.0)
MCV: 102.1 fL — ABNORMAL HIGH (ref 78.0–100.0)
Monocytes Absolute: 2.7 10*3/uL — ABNORMAL HIGH (ref 0.1–1.0)
Monocytes Relative: 11 % (ref 3–12)
Platelets: 641 10*3/uL — ABNORMAL HIGH (ref 150–400)
RBC: 1.45 MIL/uL — ABNORMAL LOW (ref 4.22–5.81)
RDW: 21.5 % — ABNORMAL HIGH (ref 11.5–15.5)
WBC: 24.3 10*3/uL — ABNORMAL HIGH (ref 4.0–10.5)

## 2013-08-21 LAB — PATHOLOGIST SMEAR REVIEW

## 2013-08-21 MED ORDER — SODIUM CHLORIDE 0.45 % IV SOLN
INTRAVENOUS | Status: DC
  Administered 2013-08-21 – 2013-08-25 (×4): via INTRAVENOUS

## 2013-08-21 MED ORDER — HYDROMORPHONE 0.3 MG/ML IV SOLN
INTRAVENOUS | Status: DC
  Administered 2013-08-21: 13:00:00 7.44 mg via INTRAVENOUS
  Administered 2013-08-21: 4.99 mg via INTRAVENOUS
  Administered 2013-08-21: 6.99 mg via INTRAVENOUS
  Administered 2013-08-21: 22:00:00 via INTRAVENOUS
  Administered 2013-08-22: 4 mg via INTRAVENOUS
  Administered 2013-08-22: 4.62 mg via INTRAVENOUS
  Administered 2013-08-22: 5.99 mg via INTRAVENOUS
  Administered 2013-08-22: 13:00:00 via INTRAVENOUS
  Administered 2013-08-22: 4.61 mg via INTRAVENOUS
  Administered 2013-08-22 (×2): via INTRAVENOUS
  Administered 2013-08-22: 1 mg via INTRAVENOUS
  Administered 2013-08-22: 8.13 mg via INTRAVENOUS
  Administered 2013-08-23: 03:00:00 via INTRAVENOUS
  Administered 2013-08-23: 4.54 mg via INTRAVENOUS
  Administered 2013-08-23: 13:00:00 1 mg via INTRAVENOUS
  Administered 2013-08-23: 3 mg via INTRAVENOUS
  Administered 2013-08-23: 1 mg via INTRAVENOUS
  Administered 2013-08-23: 3.6 mg via INTRAVENOUS
  Administered 2013-08-23: 1 mg via INTRAVENOUS
  Administered 2013-08-23: 20:00:00 via INTRAVENOUS
  Administered 2013-08-24: 1.47 mg via INTRAVENOUS
  Administered 2013-08-24: 7.02 mg via INTRAVENOUS
  Administered 2013-08-24 (×2): via INTRAVENOUS
  Administered 2013-08-24: 3.99 mg via INTRAVENOUS
  Administered 2013-08-24: 4 mg via INTRAVENOUS
  Administered 2013-08-24 – 2013-08-25 (×3): via INTRAVENOUS
  Administered 2013-08-25: 3.99 mg via INTRAVENOUS
  Administered 2013-08-25: 5 mg via INTRAVENOUS
  Administered 2013-08-25: 13:00:00 via INTRAVENOUS
  Administered 2013-08-25: 4.79 mg via INTRAVENOUS
  Administered 2013-08-25: 8.48 mg via INTRAVENOUS
  Administered 2013-08-26: 4.99 mg via INTRAVENOUS
  Administered 2013-08-26: 14:00:00 via INTRAVENOUS
  Administered 2013-08-26: 2.49 mg via INTRAVENOUS
  Administered 2013-08-26: 1.77 mg via INTRAVENOUS
  Administered 2013-08-26: 4.19 mg via INTRAVENOUS
  Administered 2013-08-26: 2.49 mg via INTRAVENOUS
  Administered 2013-08-26: 4.49 mg via INTRAVENOUS
  Administered 2013-08-26: 08:00:00 via INTRAVENOUS
  Administered 2013-08-27: 5.49 mg via INTRAVENOUS
  Administered 2013-08-27: 10:00:00 via INTRAVENOUS
  Administered 2013-08-27: 4.74 mg via INTRAVENOUS
  Filled 2013-08-21 (×22): qty 25

## 2013-08-21 NOTE — Progress Notes (Signed)
[facility] PROGRESS NOTE   Dennis Bernard HYQ:[id number] DOB: 03/20/1986 DOA: 08/07/2013 PCP: No primary provider on file.  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  - Hb < 5, new units in the blood bank even less compatible than previous units. Some improvement on his own overnight from 4.1 to 4.6, will hold transfusing further for now. It does not seem to be helping.  - Hb stable at 4.7 this morning. If he holds > 5 for 2 days he can probably go home, appreciate hematology input as well.  Sickle cell pain and crisis  - high reticulocytes - Continue current management with LA morphine and Dilaudid PCA. CXR negative for findings concerning for acute chest at this time, repeated CXR 12/7 without acute changes.  - weaning off his PCA again as tolerated and continue oral Morphine dosing.  - Has been transfused 4 units of packed red blood cells so far - patient will need to be premedicated with Solu-Medrol 40 IV, Tylenol 650 by mouth and Benadryl 50 by mouth prior to all transfusions  - continue scheduled Naprosyn and oxycodone when necessary for pain control.  - Continue Benadryl when necessary  Pseudomonas urinary tract infection - Urine culture is growing pseudomonas aeruginosa greater than 10 to the fifth colonies per mL. This is somewhat surprising given negative initial urinalysis. Patient does have some symptoms of burning with urination and has noticed his urine has been darker. Will go ahead and treat, and this is a complicated urinary tract infection in this male patient. - on Cefpime 12/3, sensitivities available on 12/4, transitioned to oral Ciprofloxacin. Stable, improving. Will need a 14-day course Fracture of phalanx of left ring finger  Continue immobilization and Pain control.  - appreciate ortho input.  Fever/Leukocytosis  - Multifactorial due to  #1 and also UTI, also delayed transfusion reaction - chest x-ray on admission with no airspace disease - Admission blood cultures no growth to date  - Chest x-ray 12/1 with increased very hilar markings and central peribronchial thickening consistent with bronchitis and or edema  - CXR 12/7 with similar appearance of chronic interstitial coarsening without any acute findings.  Diet: Regular diet Fluids: Normal saline DVT Prophylaxis: Heparin  Code Status: Full code Family Communication: None  Disposition Plan: Inpatient, home when medically ready  Consultants:  Oncology  Orthopedic surgery  Procedures:  none   Antibiotics  Anti-infectives   Start     Dose/Rate Route Frequency Ordered Stop   08/17/13 1000  ciprofloxacin (CIPRO) tablet 500 mg     500 mg Oral 2 times daily 08/17/13 0821     08/16/13 0900  ceFEPIme (MAXIPIME) 2 g in dextrose 5 % 50 mL IVPB  Status:  Discontinued     2 g 100 mL/hr over 30 Minutes Intravenous Every 12 hours 08/16/13 0836 08/17/13 0821   08/14/13 1400  azithromycin (ZITHROMAX) tablet 500 mg  Status:  Discontinued     500 mg Oral Daily 08/14/13 1241 08/16/13 0813     Antibiotics Given (last 72 hours)   Date/Time Action Medication Dose   08/18/13 1953 Given   ciprofloxacin (CIPRO) tablet 500 mg 500 mg   08/19/13 0941 Given   ciprofloxacin (CIPRO) tablet 500 mg 500 mg   08/19/13 2119 Given   ciprofloxacin (CIPRO) tablet 500 mg 500 mg   08/20/13 9528 Given   ciprofloxacin (CIPRO) tablet 500 mg 500 mg  08/20/13 2020 Given   ciprofloxacin (CIPRO) tablet 500 mg 500 mg   08/21/13 1610 Given   ciprofloxacin (CIPRO) tablet 500 mg 500 mg      HPI/Subjective: - feels a bit better this morning  Objective: Filed Vitals:   08/20/13 2143 08/20/13 2317 08/21/13 0400 08/21/13 0434  BP: 106/64   111/68  Pulse: 102   102  Temp: 98 F (36.7 C)   98.5 F (36.9 C)  TempSrc: Oral   Oral  Resp: 18 18 20 20   Height:      Weight:    79.379 kg (175  lb)  SpO2: 100% 97% 100% 100%    Intake/Output Summary (Last 24 hours) at 08/21/13 1005 Last data filed at 08/21/13 9604  Gross per 24 hour  Intake 2104.98 ml  Output   3925 ml  Net -1820.02 ml   Filed Weights   08/07/13 1743 08/07/13 2300 08/21/13 0434  Weight: 74.844 kg (165 lb) 80.1 kg (176 lb 9.4 oz) 79.379 kg (175 lb)    Exam:   General:  NAD  Cardiovascular: regular rate and rhythm, without MRG  Respiratory: good air movement, clear to auscultation throughout, no wheezing, ronchi or rales  Abdomen: soft, not tender to palpation, positive bowel sounds  MSK: no peripheral edema  Neuro: Nonfocal  Data Reviewed: Basic Metabolic Panel:  Recent Labs Lab 08/15/13 0513 08/18/13 0443  NA 134* 137  K 4.9 4.5  CL 101 103  CO2 23 24  GLUCOSE 91 103*  BUN 15 18  CREATININE 0.72 0.86  CALCIUM 9.2 9.1   Liver Function Tests:  Recent Labs Lab 08/15/13 0513 08/20/13 0348  AST 30 26  ALT 19 26  ALKPHOS 157* 222*  BILITOT 2.9* 2.6*  PROT 7.1 7.9  ALBUMIN 3.3* 3.6   CBC:  Recent Labs Lab 08/17/13 0410 08/18/13 0443 08/19/13 0615 08/20/13 0348 08/21/13 0415  WBC 18.8* 17.7* 21.8* 25.7* 24.3*  NEUTROABS  --   --   --   --  14.6*  HGB 4.9* 4.3* 4.1* 4.6* 4.7*  HCT 14.3* 12.8* 12.1* 14.2* 14.8*  MCV 95.3 93.4 93.8 98.6 102.1*  PLT 412* 441* 457* 608* 641*   Recent Results (from the past 240 hour(s))  CULTURE, BLOOD (ROUTINE X 2)     Status: None   Collection Time    08/14/13  5:10 AM      Result Value Range Status   Specimen Description BLOOD LEFT HAND   Final   Special Requests BOTTLES DRAWN AEROBIC AND ANAEROBIC 3CC EA   Final   Culture  Setup Time     Final   Value: 08/14/2013 08:57     Performed at [facility]   Culture     Final   Value: NO GROWTH 5 DAYS     Performed at [facility]   Report Status 08/20/2013 FINAL   Final  CULTURE, BLOOD (ROUTINE X 2)     Status: None   Collection Time    08/14/13  5:16 AM      Result  Value Range Status   Specimen Description BLOOD LEFT HAND   Final   Special Requests BOTTLES DRAWN AEROBIC AND ANAEROBIC 3CC EA   Final   Culture  Setup Time     Final   Value: 08/14/2013 08:57     Performed at [facility]   Culture     Final   Value: NO GROWTH 5 DAYS     Performed at  [facility]   Report Status 08/20/2013 FINAL   Final  URINE CULTURE     Status: None   Collection Time    08/14/13  8:46 AM      Result Value Range Status   Specimen Description URINE, RANDOM   Final   Special Requests NONE   Final   Culture  Setup Time     Final   Value: 08/14/2013 10:08     Performed at [facility] Count     Final   Value: >=100,000 COLONIES/ML     Performed at [facility]   Culture     Final   Value: PSEUDOMONAS AERUGINOSA     Performed at [facility]   Report Status 08/17/2013 FINAL   Final   Organism ID, Bacteria PSEUDOMONAS AERUGINOSA   Final     Studies: Dg Chest 2 View  08/20/2013   CLINICAL DATA:  Short of breath  EXAM: CHEST  2 VIEW  COMPARISON:  08/14/2013  FINDINGS: Mild cardiac enlargement. No pleural effusions identified. Lung volumes are low. Interstitial coarsening is identified bilaterally. This appears similar to previous exam. Bony changes of sickle cell disease is noted. Previous left shoulder arthroplasty.  IMPRESSION: 1. Similar appearance of chronic interstitial coarsening 2. No acute findings noted.   Electronically Signed   By: Signa Kell M.D.   On: 08/20/2013 11:52   Dg Hand 2 View Left  08/20/2013   CLINICAL DATA:  Fracture of the left ring finger.  EXAM: LEFT HAND - 2 VIEW  COMPARISON:  08/13/2013  FINDINGS: The comminuted impacted fracture of the base of the middle phalangeal bone of the ring finger is again noted. Dorsal fragment is displaced more than the volar fragment. There has been no significant change.  IMPRESSION: No significant change in the appearance of the impacted comminuted displaced  fracture of the middle phalanx of the left ring finger.   Electronically Signed   By: Geanie Cooley M.D.   On: 08/20/2013 15:54    Scheduled Meds: . acetaminophen  650 mg Oral Once  . ciprofloxacin  500 mg Oral BID  . diphenhydrAMINE  50 mg Oral Once  . feeding supplement (ENSURE COMPLETE)  237 mL Oral TID BM  . fluticasone  1 spray Each Nare Daily  . folic acid  1 mg Oral Daily  . heparin  5,000 Units Subcutaneous Q8H  . HYDROmorphone PCA 0.3 mg/mL   Intravenous Q4H  . methylPREDNISolone (SOLU-MEDROL) injection  40 mg Intravenous Once  . morphine  90 mg Oral BID  . multivitamin with minerals  1 tablet Oral Daily  . naproxen  500 mg Oral BID WC  . pantoprazole  40 mg Oral Daily  . senna-docusate  1 tablet Oral BID  . sodium chloride  10-40 mL Intracatheter Q12H   Continuous Infusions: . sodium chloride 20 mL/hr at 08/21/13 0025    Principal Problem:   Sickle cell pain crisis Active Problems:   Sickle cell anemia   Fracture of phalanx of left ring finger  Time spent: 25   Pamella Pert, MD [facility] Pager [phone]. If 7 PM - 7 AM, please contact night-coverage at [email or web address], password [facility] 08/21/2013, 10:05 AM  LOS: 14 days

## 2013-08-22 LAB — CBC
MCV: 101.9 fL — ABNORMAL HIGH (ref 78.0–100.0)
Platelets: 614 10*3/uL — ABNORMAL HIGH (ref 150–400)
RBC: 1.58 MIL/uL — ABNORMAL LOW (ref 4.22–5.81)
RDW: 21.9 % — ABNORMAL HIGH (ref 11.5–15.5)
WBC: 19.8 10*3/uL — ABNORMAL HIGH (ref 4.0–10.5)

## 2013-08-22 LAB — BASIC METABOLIC PANEL
CO2: 23 mEq/L (ref 19–32)
Chloride: 102 mEq/L (ref 96–112)
Potassium: 4.7 mEq/L (ref 3.5–5.1)
Sodium: 136 mEq/L (ref 135–145)

## 2013-08-22 MED ORDER — ENSURE PUDDING PO PUDG
1.0000 | Freq: Once | ORAL | Status: AC
  Administered 2013-08-22: 1 via ORAL
  Filled 2013-08-22: qty 1

## 2013-08-22 MED ORDER — ENSURE PUDDING PO PUDG
1.0000 | ORAL | Status: DC
  Administered 2013-08-23 – 2013-08-25 (×3): 1 via ORAL
  Filled 2013-08-22 (×3): qty 1

## 2013-08-22 MED ORDER — JUVEN PO PACK
1.0000 | PACK | Freq: Two times a day (BID) | ORAL | Status: DC
  Administered 2013-08-22 – 2013-08-28 (×6): 1 via ORAL
  Filled 2013-08-22 (×14): qty 1

## 2013-08-22 NOTE — Progress Notes (Signed)
04:40am-Lab called a critical Hgb=4.9, but this is an improvement from yesterdayHgb=4.7. Did not call critical to MD, as this is a consistant improvement & they will view labs when rounding on pt, & the blood is being held at this time due to his many antibodies.

## 2013-08-22 NOTE — Progress Notes (Signed)
NUTRITION FOLLOW UP  Intervention:   Continue Ensure Complete TID Provide Ensure Pudding once daily Provide Juven BID; contains glutamine and arginine Continue Multivitamin with minerals daily  Nutrition Dx:   Inadequate oral intake related to poor appetite as evidenced by pt's report; ongoing  Goal:   Pt to meet >/= 90% of their estimated nutrition needs; not met   Monitor:   PO intake; 0-100% of meals, only 1 or 2 meals daily Weight; 6 lb weight loss since admission but, at reported usual body weight Labs; low hemoglobin, elevated LDH, albumin now WNL, high ferritin  Assessment:   27 y.o. male with h/o sickle cell anemia, frequent admissions for crisis. Patient presents with typical low back pain symptoms from sickle cell crisis ongoing for the past 3 days. Developed chest pain and SOB. Pt states that he has been eating only 1 meal most days due to pain and poor appetite. He states he still likes the Ensure supplements and is drinking them daily. Encouraged PO intake. Pt willing to try additional supplements until food intake improves.  Weight has dropped 6 lbs since admission.   Height: Ht Readings from Last 1 Encounters:  08/07/13 5\' 9"  (1.753 m)    Weight Status:   Wt Readings from Last 1 Encounters:  08/22/13 170 lb 13.7 oz (77.5 kg)    Re-estimated needs:  Kcal: 2000-2200  Protein: 100-110 grams  Fluid: 2-2.2 L/day  Skin: intact; non-pitting RUE, RLE, and LLE edema  Diet Order: General   Intake/Output Summary (Last 24 hours) at 08/22/13 1522 Last data filed at 08/22/13 1500  Gross per 24 hour  Intake 2278.63 ml  Output   4175 ml  Net -1896.37 ml    Last BM: 12/6   Labs:   Recent Labs Lab 08/18/13 0443 08/22/13 0400  NA 137 136  K 4.5 4.7  CL 103 102  CO2 24 23  BUN 18 12  CREATININE 0.86 0.88  CALCIUM 9.1 9.4  GLUCOSE 103* 105*    CBG (last 3)  No results found for this basename: GLUCAP,  in the last 72 hours  Scheduled Meds: .  acetaminophen  650 mg Oral Once  . ciprofloxacin  500 mg Oral BID  . diphenhydrAMINE  50 mg Oral Once  . feeding supplement (ENSURE COMPLETE)  237 mL Oral TID BM  . fluticasone  1 spray Each Nare Daily  . folic acid  1 mg Oral Daily  . heparin  5,000 Units Subcutaneous Q8H  . HYDROmorphone PCA 0.3 mg/mL   Intravenous Q4H  . methylPREDNISolone (SOLU-MEDROL) injection  40 mg Intravenous Once  . morphine  90 mg Oral BID  . multivitamin with minerals  1 tablet Oral Daily  . naproxen  500 mg Oral BID WC  . pantoprazole  40 mg Oral Daily  . senna-docusate  1 tablet Oral BID  . sodium chloride  10-40 mL Intracatheter Q12H    Continuous Infusions: . sodium chloride 20 mL/hr at 08/21/13 2035    Ian Malkin RD, LDN Inpatient Clinical Dietitian Pager: [phone] After Hours Pager: [phone]

## 2013-08-22 NOTE — Progress Notes (Signed)
[facility] PROGRESS NOTE   Dennis Bernard ZOX:[id number] DOB: July 16, 1986 DOA: 08/07/2013 PCP: No primary provider on file.  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 Concerning today is that he is having worsening of his back pain, feeling like his crisis is again active. - Hb < 5, new units in the blood bank even less compatible than previous units. Some improvement on his own from 4.1>>4.6>>4.7>>4.9, , will hold transfusing further for now. It does not seem to be helping.  - It's reasonable that If he holds > 5 for 2 days he can probably go home, appreciate hematology input as well.  Sickle cell pain and crisis  - high reticulocytes - Continue current management with LA morphine and Dilaudid PCA. CXR negative for findings concerning for acute chest at this time, repeated CXR 12/7 without acute changes.  - weaning off his PCA again as tolerated and continue oral Morphine dosing.  - Has been transfused 4 units of packed red blood cells so far. 2 units of blood are available for him, however they are more incompatible than previous ones.  - patient will need to be premedicated with Solu-Medrol 40 IV, Tylenol 650 by mouth and Benadryl 50 by mouth prior to all transfusions  - continue scheduled Naprosyn and oxycodone when necessary for pain control.  - Continue Benadryl when necessary  Pseudomonas urinary tract infection - Urine culture is growing pseudomonas aeruginosa greater than 10 to the fifth colonies per mL. This is somewhat surprising given negative initial urinalysis. Patient does have some symptoms of burning with urination and has noticed his urine has been darker. Will go ahead and treat, and this is a complicated urinary tract infection in this male patient. - on Cefpime 12/3, sensitivities available on 12/4, transitioned to oral Ciprofloxacin. Stable,  improving. Will need a 14-day course Fracture of phalanx of left ring finger  Continue immobilization and Pain control.  - appreciate ortho input.  Fever/Leukocytosis  - Multifactorial due to #1 and also UTI, also delayed transfusion reaction - chest x-ray on admission with no airspace disease - Admission blood cultures no growth to date  - Chest x-ray 12/1 with increased very hilar markings and central peribronchial thickening consistent with bronchitis and or edema  - CXR 12/7 with similar appearance of chronic interstitial coarsening without any acute findings.  Diet: Regular diet Fluids: Normal saline DVT Prophylaxis: Heparin  Code Status: Full code Family Communication: None  Disposition Plan: Inpatient, home when medically ready  Consultants:  Oncology  Orthopedic surgery  Procedures:  none   Antibiotics  Anti-infectives   Start     Dose/Rate Route Frequency Ordered Stop   08/17/13 1000  ciprofloxacin (CIPRO) tablet 500 mg     500 mg Oral 2 times daily 08/17/13 0821     08/16/13 0900  ceFEPIme (MAXIPIME) 2 g in dextrose 5 % 50 mL IVPB  Status:  Discontinued     2 g 100 mL/hr over 30 Minutes Intravenous Every 12 hours 08/16/13 0836 08/17/13 0821   08/14/13 1400  azithromycin (ZITHROMAX) tablet 500 mg  Status:  Discontinued     500 mg Oral Daily 08/14/13 1241 08/16/13 0813     Antibiotics Given (last 72 hours)   Date/Time Action Medication Dose   08/19/13 2119 Given   ciprofloxacin (CIPRO) tablet 500 mg 500 mg   08/20/13 8119 Given   ciprofloxacin (CIPRO) tablet 500 mg 500  mg   08/20/13 2020 Given   ciprofloxacin (CIPRO) tablet 500 mg 500 mg   08/21/13 3086 Given   ciprofloxacin (CIPRO) tablet 500 mg 500 mg   08/21/13 2114 Given   ciprofloxacin (CIPRO) tablet 500 mg 500 mg   08/22/13 0818 Given   ciprofloxacin (CIPRO) tablet 500 mg 500 mg      HPI/Subjective: - worsening back pain c/w his sickle cell crises  Objective: Filed Vitals:   08/22/13 0800  08/22/13 1125 08/22/13 1248 08/22/13 1300  BP:    100/54  Pulse:    99  Temp:    98.2 F (36.8 C)  TempSrc:    Oral  Resp: 16 15 16 16   Height:      Weight:      SpO2: 100% 10% 100% 98%    Intake/Output Summary (Last 24 hours) at 08/22/13 1622 Last data filed at 08/22/13 1525  Gross per 24 hour  Intake 2278.63 ml  Output   4725 ml  Net -2446.37 ml   Filed Weights   08/07/13 2300 08/21/13 0434 08/22/13 0504  Weight: 80.1 kg (176 lb 9.4 oz) 79.379 kg (175 lb) 77.5 kg (170 lb 13.7 oz)    Exam:   General:  NAD  Cardiovascular: regular rate and rhythm, without MRG  Respiratory: good air movement, clear to auscultation throughout, no wheezing, ronchi or rales  Abdomen: soft, not tender to palpation, positive bowel sounds  MSK: no peripheral edema  Neuro: Nonfocal  Data Reviewed: Basic Metabolic Panel:  Recent Labs Lab 08/18/13 0443 08/22/13 0400  NA 137 136  K 4.5 4.7  CL 103 102  CO2 24 23  GLUCOSE 103* 105*  BUN 18 12  CREATININE 0.86 0.88  CALCIUM 9.1 9.4   Liver Function Tests:  Recent Labs Lab 08/20/13 0348  AST 26  ALT 26  ALKPHOS 222*  BILITOT 2.6*  PROT 7.9  ALBUMIN 3.6   CBC:  Recent Labs Lab 08/18/13 0443 08/19/13 0615 08/20/13 0348 08/21/13 0415 08/22/13 0400  WBC 17.7* 21.8* 25.7* 24.3* 19.8*  NEUTROABS  --   --   --  14.6*  --   HGB 4.3* 4.1* 4.6* 4.7* 4.9*  HCT 12.8* 12.1* 14.2* 14.8* 16.1*  MCV 93.4 93.8 98.6 102.1* 101.9*  PLT 441* 457* 608* 641* 614*   Recent Results (from the past 240 hour(s))  CULTURE, BLOOD (ROUTINE X 2)     Status: None   Collection Time    08/14/13  5:10 AM      Result Value Range Status   Specimen Description BLOOD LEFT HAND   Final   Special Requests BOTTLES DRAWN AEROBIC AND ANAEROBIC 3CC EA   Final   Culture  Setup Time     Final   Value: 08/14/2013 08:57     Performed at [facility]   Culture     Final   Value: NO GROWTH 5 DAYS     Performed at [facility]   Report  Status 08/20/2013 FINAL   Final  CULTURE, BLOOD (ROUTINE X 2)     Status: None   Collection Time    08/14/13  5:16 AM      Result Value Range Status   Specimen Description BLOOD LEFT HAND   Final   Special Requests BOTTLES DRAWN AEROBIC AND ANAEROBIC 3CC EA   Final   Culture  Setup Time     Final   Value: 08/14/2013 08:57     Performed at [facility]  Culture     Final   Value: NO GROWTH 5 DAYS     Performed at [facility]   Report Status 08/20/2013 FINAL   Final  URINE CULTURE     Status: None   Collection Time    08/14/13  8:46 AM      Result Value Range Status   Specimen Description URINE, RANDOM   Final   Special Requests NONE   Final   Culture  Setup Time     Final   Value: 08/14/2013 10:08     Performed at [facility] Count     Final   Value: >=100,000 COLONIES/ML     Performed at [facility]   Culture     Final   Value: PSEUDOMONAS AERUGINOSA     Performed at [facility]   Report Status 08/17/2013 FINAL   Final   Organism ID, Bacteria PSEUDOMONAS AERUGINOSA   Final     Studies: No results found.  Scheduled Meds: . acetaminophen  650 mg Oral Once  . ciprofloxacin  500 mg Oral BID  . diphenhydrAMINE  50 mg Oral Once  . feeding supplement (ENSURE COMPLETE)  237 mL Oral TID BM  . feeding supplement (ENSURE)  1 Container Oral Once  . [START ON 08/23/2013] feeding supplement (ENSURE)  1 Container Oral Q24H  . fluticasone  1 spray Each Nare Daily  . folic acid  1 mg Oral Daily  . heparin  5,000 Units Subcutaneous Q8H  . HYDROmorphone PCA 0.3 mg/mL   Intravenous Q4H  . methylPREDNISolone (SOLU-MEDROL) injection  40 mg Intravenous Once  . morphine  90 mg Oral BID  . multivitamin with minerals  1 tablet Oral Daily  . naproxen  500 mg Oral BID WC  . nutrition supplement (JUVEN)  1 packet Oral BID  . pantoprazole  40 mg Oral Daily  . senna-docusate  1 tablet Oral BID  . sodium chloride  10-40 mL Intracatheter Q12H    Continuous Infusions: . sodium chloride 20 mL/hr at 08/21/13 2035    Principal Problem:   Sickle cell pain crisis Active Problems:   Sickle cell anemia   Fracture of phalanx of left ring finger  Time spent: 25   Pamella Pert, MD [facility] Pager [phone]. If 7 PM - 7 AM, please contact night-coverage at [email or web address], password [facility] 08/22/2013, 4:22 PM  LOS: 15 days

## 2013-08-23 ENCOUNTER — Inpatient Hospital Stay (HOSPITAL_COMMUNITY): Payer: Medicare Other

## 2013-08-23 DIAGNOSIS — M545 Low back pain: Secondary | ICD-10-CM

## 2013-08-23 DIAGNOSIS — R799 Abnormal finding of blood chemistry, unspecified: Secondary | ICD-10-CM

## 2013-08-23 LAB — COMPREHENSIVE METABOLIC PANEL
AST: 19 U/L (ref 0–37)
BUN: 15 mg/dL (ref 6–23)
CO2: 24 mEq/L (ref 19–32)
Chloride: 101 mEq/L (ref 96–112)
Creatinine, Ser: 0.96 mg/dL (ref 0.50–1.35)
GFR calc non Af Amer: >90 mL/min (ref >90)
Glucose, Bld: 99 mg/dL (ref 70–99)
Total Bilirubin: 1.7 mg/dL — ABNORMAL HIGH (ref 0.3–1.2)
Total Protein: 7.2 g/dL (ref 6.0–8.3)

## 2013-08-23 LAB — CBC
HCT: 12.5 % — ABNORMAL LOW (ref 39.0–52.0)
Hemoglobin: 4.2 g/dL — CL (ref 13.0–17.0)
MCV: 98.4 fL (ref 78.0–100.0)
RBC: 1.27 MIL/uL — ABNORMAL LOW (ref 4.22–5.81)
WBC: 16.9 10*3/uL — ABNORMAL HIGH (ref 4.0–10.5)

## 2013-08-23 MED ORDER — ACETAMINOPHEN 325 MG PO TABS
650.0000 mg | ORAL_TABLET | Freq: Once | ORAL | Status: AC
  Administered 2013-08-23: 650 mg via ORAL
  Filled 2013-08-23: qty 2

## 2013-08-23 MED ORDER — METHYLPREDNISOLONE SODIUM SUCC 40 MG IJ SOLR
40.0000 mg | Freq: Once | INTRAMUSCULAR | Status: AC
  Administered 2013-08-23: 40 mg via INTRAVENOUS
  Filled 2013-08-23: qty 1

## 2013-08-23 MED ORDER — DIPHENHYDRAMINE HCL 50 MG/ML IJ SOLN
25.0000 mg | Freq: Once | INTRAMUSCULAR | Status: AC
  Administered 2013-08-23: 25 mg via INTRAVENOUS
  Filled 2013-08-23: qty 1

## 2013-08-23 NOTE — Progress Notes (Addendum)
[facility] PROGRESS NOTE   Bradey Luzier ZOX:[id number] DOB: 12/12/1985 DOA: 08/07/2013 PCP: No primary provider on file.  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 Concerning today is that he is having worsening of his back pain, feeling like his crisis is again active. - Hb < 5, new units in the blood bank even less compatible than previous units. Some improvement on his own from 4.1>>4.6>>4.7>>4.9, -hgb down to 4.2 today and Dr Cyndie Chime transfusing 2U PRBCs with premedication, follow - It's reasonable that If he holds > 5 for 2 days he can probably go home, appreciate hematology input as well.  Sickle cell pain and crisis  - high reticulocytes - Continue current management with LA morphine and Dilaudid PCA. CXR negative for findings concerning for acute chest at this time, repeated CXR 12/7 without acute changes.  - weaning off his PCA again as tolerated and continue oral Morphine dosing.  - Has been transfused 4 units of packed red blood cells so far. 2 units of blood are available for him, however they are more incompatible than previous ones.  - patient will need to be premedicated with Solu-Medrol 40 IV, Tylenol 650 by mouth and Benadryl 50 by mouth prior to all transfusions  - continue scheduled Naprosyn and oxycodone when necessary for pain control.  - Continue Benadryl when necessary  Pseudomonas urinary tract infection - Urine culture is growing pseudomonas aeruginosa greater than 10 to the fifth colonies per mL. Was initially treated with Cefpime 12/3, after sensitivities became  available on 12/4 was transitioned to oral Ciprofloxacin. -continue cipro to complete  a 14-day course Fracture of phalanx of left ring finger  Continue immobilization and Pain control.  - appreciate ortho input- per Dr Amanda Pea conservative management for now.   Fever/Leukocytosis  - Multifactorial due to #1 and also UTI, also delayed transfusion reaction - chest x-ray on admission with no airspace disease - Admission blood cultures no growth to date  - Chest x-ray 12/1 with increased very hilar markings and central peribronchial thickening consistent with bronchitis and or edema  - CXR 12/7 with similar appearance of chronic interstitial coarsening without any acute findings.  Diet: Regular diet Fluids: Normal saline DVT Prophylaxis: Heparin  Code Status: Full code Family Communication: None  Disposition Plan: Inpatient, home when medically ready  Consultants:  Oncology  Orthopedic surgery  Procedures:  none   Antibiotics  Anti-infectives   Start     Dose/Rate Route Frequency Ordered Stop   08/17/13 1000  ciprofloxacin (CIPRO) tablet 500 mg     500 mg Oral 2 times daily 08/17/13 0821     08/16/13 0900  ceFEPIme (MAXIPIME) 2 g in dextrose 5 % 50 mL IVPB  Status:  Discontinued     2 g 100 mL/hr over 30 Minutes Intravenous Every 12 hours 08/16/13 0836 08/17/13 0821   08/14/13 1400  azithromycin (ZITHROMAX) tablet 500 mg  Status:  Discontinued     500 mg Oral Daily 08/14/13 1241 08/16/13 0813     Antibiotics Given (last 72 hours)   Date/Time Action Medication Dose   08/20/13 2020 Given   ciprofloxacin (CIPRO) tablet 500 mg 500 mg   08/21/13 0834 Given   ciprofloxacin (CIPRO) tablet 500 mg 500 mg   08/21/13 2114 Given   ciprofloxacin (CIPRO) tablet 500 mg 500 mg   08/22/13 0818 Given   ciprofloxacin (CIPRO) tablet 500 mg 500 mg  08/22/13 2007 Given   ciprofloxacin (CIPRO) tablet 500 mg 500 mg   08/23/13 0830 Given   ciprofloxacin (CIPRO) tablet 500 mg 500 mg      HPI/Subjective: - states back pain about the same   Objective: Filed Vitals:   08/23/13 1545 08/23/13 1600 08/23/13 1605 08/23/13 1635  BP: 102/66  105/59 95/45  Pulse: 88  91 90  Temp: 98.4 F (36.9 C)  98 F (36.7 C) 97.9 F (36.6 C)  TempSrc:  Oral  Oral Oral  Resp: 16 13 13 15   Height:      Weight:      SpO2: 98% 99% 99% 98%    Intake/Output Summary (Last 24 hours) at 08/23/13 1714 Last data filed at 08/23/13 1545  Gross per 24 hour  Intake 1191.38 ml  Output   1400 ml  Net -208.62 ml   Filed Weights   08/21/13 0434 08/22/13 0504 08/23/13 0634  Weight: 79.379 kg (175 lb) 77.5 kg (170 lb 13.7 oz) 77.066 kg (169 lb 14.4 oz)    Exam:   General:  NAD  Cardiovascular: regular rate and rhythm, without MRG  Respiratory: good air movement, clear to auscultation throughout, no wheezing, ronchi or rales  Abdomen: soft, not tender to palpation, positive bowel sounds  Extremities: no peripheral edema  Neuro: Nonfocal  Data Reviewed: Basic Metabolic Panel:  Recent Labs Lab 08/18/13 0443 08/22/13 0400 08/23/13 0615  NA 137 136 135  K 4.5 4.7 4.8  CL 103 102 101  CO2 24 23 24   GLUCOSE 103* 105* 99  BUN 18 12 15   CREATININE 0.86 0.88 0.96  CALCIUM 9.1 9.4 9.3   Liver Function Tests:  Recent Labs Lab 08/20/13 0348 08/23/13 0615  AST 26 19  ALT 26 16  ALKPHOS 222* 186*  BILITOT 2.6* 1.7*  PROT 7.9 7.2  ALBUMIN 3.6 3.1*   CBC:  Recent Labs Lab 08/19/13 0615 08/20/13 0348 08/21/13 0415 08/22/13 0400 08/23/13 0615  WBC 21.8* 25.7* 24.3* 19.8* 16.9*  NEUTROABS  --   --  14.6*  --   --   HGB 4.1* 4.6* 4.7* 4.9* 4.2*  HCT 12.1* 14.2* 14.8* 16.1* 12.5*  MCV 93.8 98.6 102.1* 101.9* 98.4  PLT 457* 608* 641* 614* 591*   Recent Results (from the past 240 hour(s))  CULTURE, BLOOD (ROUTINE X 2)     Status: None   Collection Time    08/14/13  5:10 AM      Result Value Range Status   Specimen Description BLOOD LEFT HAND   Final   Special Requests BOTTLES DRAWN AEROBIC AND ANAEROBIC 3CC EA   Final   Culture  Setup Time     Final   Value: 08/14/2013 08:57     Performed at [facility]   Culture     Final   Value: NO GROWTH 5 DAYS     Performed at [facility]   Report Status  08/20/2013 FINAL   Final  CULTURE, BLOOD (ROUTINE X 2)     Status: None   Collection Time    08/14/13  5:16 AM      Result Value Range Status   Specimen Description BLOOD LEFT HAND   Final   Special Requests BOTTLES DRAWN AEROBIC AND ANAEROBIC 3CC EA   Final   Culture  Setup Time     Final   Value: 08/14/2013 08:57     Performed at [facility]   Culture     Final  Value: NO GROWTH 5 DAYS     Performed at [facility]   Report Status 08/20/2013 FINAL   Final  URINE CULTURE     Status: None   Collection Time    08/14/13  8:46 AM      Result Value Range Status   Specimen Description URINE, RANDOM   Final   Special Requests NONE   Final   Culture  Setup Time     Final   Value: 08/14/2013 10:08     Performed at [facility] Count     Final   Value: >=100,000 COLONIES/ML     Performed at [facility]   Culture     Final   Value: PSEUDOMONAS AERUGINOSA     Performed at [facility]   Report Status 08/17/2013 FINAL   Final   Organism ID, Bacteria PSEUDOMONAS AERUGINOSA   Final     Studies: Dg Lumbar Spine 2-3 Views  08/23/2013   CLINICAL DATA:  Sickle cell crisis. Persistent lumbosacral pain over the last 2 weeks.  EXAM: LUMBAR SPINE - 2-3 VIEW  COMPARISON:  Lumbar spine radiographs 05/17/2013.  FINDINGS: Typical changes of sickle cell anemia are noted within the lumbosacral spine. Alignment is stable. The soft tissues are unremarkable. The femoral heads are not visualized today.  IMPRESSION: A  1. Stable chronic changes of sickle cell disease in the lumbosacral spine. 2. No acute abnormality or significant interval change.   Electronically Signed   By: Gennette Pac M.D.   On: 08/23/2013 11:11    Scheduled Meds: . acetaminophen  650 mg Oral Once  . ciprofloxacin  500 mg Oral BID  . diphenhydrAMINE  50 mg Oral Once  . feeding supplement (ENSURE COMPLETE)  237 mL Oral TID BM  . feeding supplement (ENSURE)  1 Container Oral Q24H   . fluticasone  1 spray Each Nare Daily  . folic acid  1 mg Oral Daily  . heparin  5,000 Units Subcutaneous Q8H  . HYDROmorphone PCA 0.3 mg/mL   Intravenous Q4H  . methylPREDNISolone (SOLU-MEDROL) injection  40 mg Intravenous Once  . morphine  90 mg Oral BID  . multivitamin with minerals  1 tablet Oral Daily  . naproxen  500 mg Oral BID WC  . nutrition supplement (JUVEN)  1 packet Oral BID  . pantoprazole  40 mg Oral Daily  . senna-docusate  1 tablet Oral BID  . sodium chloride  10-40 mL Intracatheter Q12H   Continuous Infusions: . sodium chloride 20 mL/hr at 08/21/13 2035    Principal Problem:   Sickle cell pain crisis Active Problems:   Sickle cell anemia   Fracture of phalanx of left ring finger  Time spent: 25   Donnalee Curry, MD [facility] Pager [phone]. If 7 PM - 7 AM, please contact night-coverage at [email or web address], password [facility] 08/23/2013, 5:14 PM  LOS: 16 days

## 2013-08-23 NOTE — Progress Notes (Signed)
Patient stated he was itchy.  Patient received benadryl as pre-med prior to blood transfusion.  Dr. Suanne Marker on floor, notified.  Stated that patient may receive benadryl prior to starting second unit of blood and should not administer PRN dose of benadryl at this time.  Will continue to monitor closely.  Patient in NAD and vitals stable.

## 2013-08-23 NOTE — Progress Notes (Signed)
Patient ID: Dennis Bernard, male   DOB: October 30, 1985, 27 y.o.   MRN: [id number] Patient seen and examined.  Patient is without significant change. Unfortunately his hemoglobin is still drastically low.  His ring fingers unchanged from prior exam. He still has some swelling which is expected and a slight bit of radial deviation. He is less tender indicating healing process occurring in my opinion.  I've discussed this with him at length.  Given his other challenging circumstances and the fact that he is now minimally tender I feel that his bone is healing and is laying down callus  Given all issues and findings we are planning for conservative course.  I will plan to x-ray him weekly.  We'll begin more aggressive motion next week.  If he is discharged I would like see him in the office in a week. Certainly we do not expect this anytime soon. I will see him while he is inpatient. Will hold off on any surgery at this juncture. He understands this and the findings   Jaquayla Hege M.D.

## 2013-08-23 NOTE — Progress Notes (Signed)
Persistent low back pain despite stabilization of parameters of hemolysis. Neuro exam intact: motor strength 5/5, reflexes 1+ symmetric lower extremities Hb down to 4.2 I will order 2 units of least incompatible RBCs today X-ray Lumbar/sacral spine Consider MRI spine; suspect avascular necrosis

## 2013-08-24 LAB — TYPE AND SCREEN
ABO/RH(D): AB POS
Antibody Screen: POSITIVE
DAT, IgG: POSITIVE
Unit division: 0

## 2013-08-24 LAB — HEMOGLOBIN AND HEMATOCRIT, BLOOD
HCT: 19.1 % — ABNORMAL LOW (ref 39.0–52.0)
Hemoglobin: 6.4 g/dL — CL (ref 13.0–17.0)

## 2013-08-24 MED ORDER — BISACODYL 5 MG PO TBEC: 5.0000 mg | DELAYED_RELEASE_TABLET | Freq: Every day | ORAL | Status: DC | PRN

## 2013-08-24 MED ORDER — POLYETHYLENE GLYCOL 3350 17 G PO PACK
17.0000 g | PACK | Freq: Two times a day (BID) | ORAL | Status: DC
  Administered 2013-08-24 – 2013-08-28 (×8): 17 g via ORAL
  Filled 2013-08-24 (×10): qty 1

## 2013-08-24 NOTE — Progress Notes (Addendum)
[facility] PROGRESS NOTE   Dennis Bernard ZOX:[id number] DOB: [birth year] DOA: 08/07/2013 PCP: No primary provider on file.  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 Concerning today is that he is having worsening of his back pain, feeling like his crisis is again active. - Hb < 5, new units in the blood bank even less compatible than previous units. Some improvement on his own from 4.1>>4.6>>4.7>>4.9>4.2(12/10), - transfused  2U PRBCs with premedication, and hgb 6.2 today - It's reasonable that If he holds > 5 for 2 days he can probably go home, appreciate hematology input as well.  Sickle cell pain and crisis  - high reticulocytes  CXR negative for findings concerning for acute chest at this time, repeated CXR 12/7 without acute changes. .  - Has been transfused 6 units of packed red blood cells so far. last 2 units of blood transfused on 12/10 - Continue current management with LA morphine and Dilaudid PCA for now - discussed plan to begin weaning off his PCA again as tolerated in the am and continue oral Morphine dosing - patient will need to be premedicated with Solu-Medrol 40 IV, Tylenol 650 by mouth and Benadryl 50 by mouth prior to all transfusions  - continue scheduled Naprosyn and oxycodone when necessary for pain control.  - Continue Benadryl when necessary  Pseudomonas urinary tract infection - Urine culture is growing pseudomonas aeruginosa greater than 10 to the fifth colonies per mL. Was initially treated with Cefpime 12/3, after sensitivities became  available on 12/4 was transitioned to oral Ciprofloxacin. -continue cipro to complete  a 14-day course Fracture of phalanx of left ring finger  Continue immobilization and Pain control.  - appreciate ortho input- per Dr Amanda Pea conservative management for now.  Fever/Leukocytosis  - Multifactorial  due to #1 and also UTI, also delayed transfusion reaction - chest x-ray on admission with no airspace disease - Admission blood cultures no growth to date  - Chest x-ray 12/1 with increased perihilar markings and central peribronchial thickening consistent with bronchitis and or edema  - CXR 12/7 with similar appearance of chronic interstitial coarsening without any acute findings. Constipation -d/t narcotics -add miralax continue senekot, also prn dulcolax   Diet: Regular diet Fluids: Normal saline DVT Prophylaxis: Heparin  Code Status: Full code Family Communication: None  Disposition Plan: Inpatient, home when medically ready  Consultants:  Oncology  Orthopedic surgery  Procedures:  none   Antibiotics  Anti-infectives   Start     Dose/Rate Route Frequency Ordered Stop   08/17/13 1000  ciprofloxacin (CIPRO) tablet 500 mg     500 mg Oral 2 times daily 08/17/13 0821     08/16/13 0900  ceFEPIme (MAXIPIME) 2 g in dextrose 5 % 50 mL IVPB  Status:  Discontinued     2 g 100 mL/hr over 30 Minutes Intravenous Every 12 hours 08/16/13 0836 08/17/13 0821   08/14/13 1400  azithromycin (ZITHROMAX) tablet 500 mg  Status:  Discontinued     500 mg Oral Daily 08/14/13 1241 08/16/13 0813     Antibiotics Given (last 72 hours)   Date/Time Action Medication Dose   08/21/13 2114 Given   ciprofloxacin (CIPRO) tablet 500 mg 500 mg   08/22/13 0818 Given   ciprofloxacin (CIPRO) tablet 500 mg 500 mg   08/22/13 2007 Given   ciprofloxacin (CIPRO) tablet 500 mg 500 mg   08/23/13 0830 Given  ciprofloxacin (CIPRO) tablet 500 mg 500 mg   08/23/13 1932 Given   ciprofloxacin (CIPRO) tablet 500 mg 500 mg   08/24/13 1610 Given   ciprofloxacin (CIPRO) tablet 500 mg 500 mg      HPI/Subjective: - states back pain about the same, c/o constipation   Objective: Filed Vitals:   08/24/13 0111 08/24/13 0344 08/24/13 0624 08/24/13 0800  BP:   109/66   Pulse:   75   Temp:   97.3 F (36.3 C)    TempSrc:   Oral   Resp: 17 15 18 10   Height:      Weight:   77.1 kg (169 lb 15.6 oz)   SpO2: 100% 100% 100% 99%    Intake/Output Summary (Last 24 hours) at 08/24/13 1032 Last data filed at 08/24/13 1010  Gross per 24 hour  Intake 805.72 ml  Output   3050 ml  Net -2244.28 ml   Filed Weights   08/22/13 0504 08/23/13 0634 08/24/13 0624  Weight: 77.5 kg (170 lb 13.7 oz) 77.066 kg (169 lb 14.4 oz) 77.1 kg (169 lb 15.6 oz)    Exam:   General:  NAD  Cardiovascular: regular rate and rhythm, without MRG  Respiratory: good air movement, clear to auscultation throughout, no wheezing, ronchi or rales  Abdomen: soft, not tender to palpation, positive bowel sounds  Extremities: no peripheral edema  Neuro: Nonfocal  Data Reviewed: Basic Metabolic Panel:  Recent Labs Lab 08/18/13 0443 08/22/13 0400 08/23/13 0615  NA 137 136 135  K 4.5 4.7 4.8  CL 103 102 101  CO2 24 23 24   GLUCOSE 103* 105* 99  BUN 18 12 15   CREATININE 0.86 0.88 0.96  CALCIUM 9.1 9.4 9.3   Liver Function Tests:  Recent Labs Lab 08/20/13 0348 08/23/13 0615  AST 26 19  ALT 26 16  ALKPHOS 222* 186*  BILITOT 2.6* 1.7*  PROT 7.9 7.2  ALBUMIN 3.6 3.1*   CBC:  Recent Labs Lab 08/19/13 0615 08/20/13 0348 08/21/13 0415 08/22/13 0400 08/23/13 0615 08/24/13 0500  WBC 21.8* 25.7* 24.3* 19.8* 16.9*  --   NEUTROABS  --   --  14.6*  --   --   --   HGB 4.1* 4.6* 4.7* 4.9* 4.2* 6.4*  HCT 12.1* 14.2* 14.8* 16.1* 12.5* 19.1*  MCV 93.8 98.6 102.1* 101.9* 98.4  --   PLT 457* 608* 641* 614* 591*  --    No results found for this or any previous visit (from the past 240 hour(s)).   Studies: Dg Lumbar Spine 2-3 Views  08/23/2013   CLINICAL DATA:  Sickle cell crisis. Persistent lumbosacral pain over the last 2 weeks.  EXAM: LUMBAR SPINE - 2-3 VIEW  COMPARISON:  Lumbar spine radiographs 05/17/2013.  FINDINGS: Typical changes of sickle cell anemia are noted within the lumbosacral spine. Alignment is  stable. The soft tissues are unremarkable. The femoral heads are not visualized today.  IMPRESSION: A  1. Stable chronic changes of sickle cell disease in the lumbosacral spine. 2. No acute abnormality or significant interval change.   Electronically Signed   By: Gennette Pac M.D.   On: 08/23/2013 11:11    Scheduled Meds: . acetaminophen  650 mg Oral Once  . ciprofloxacin  500 mg Oral BID  . diphenhydrAMINE  50 mg Oral Once  . feeding supplement (ENSURE COMPLETE)  237 mL Oral TID BM  . feeding supplement (ENSURE)  1 Container Oral Q24H  . fluticasone  1 spray Each Nare Daily  .  folic acid  1 mg Oral Daily  . heparin  5,000 Units Subcutaneous Q8H  . HYDROmorphone PCA 0.3 mg/mL   Intravenous Q4H  . methylPREDNISolone (SOLU-MEDROL) injection  40 mg Intravenous Once  . morphine  90 mg Oral BID  . multivitamin with minerals  1 tablet Oral Daily  . naproxen  500 mg Oral BID WC  . nutrition supplement (JUVEN)  1 packet Oral BID  . pantoprazole  40 mg Oral Daily  . polyethylene glycol  17 g Oral BID  . senna-docusate  1 tablet Oral BID  . sodium chloride  10-40 mL Intracatheter Q12H   Continuous Infusions: . sodium chloride 20 mL/hr at 08/21/13 2035    Principal Problem:   Sickle cell pain crisis Active Problems:   Sickle cell anemia   Fracture of phalanx of left ring finger  Time spent: 25   Donnalee Curry, MD [facility] Pager [phone]. If 7 PM - 7 AM, please contact night-coverage at [email or web address], password [facility] 08/24/2013, 10:32 AM  LOS: 17 days

## 2013-08-24 NOTE — Progress Notes (Signed)
Hemoglobin rise to 6.4 after 2 units of least incompatible blood. Lumbar X-rays show typical sickle bone disease changes but no sign of fracture/inflammation Persistent low back pain Exam no change. Decreased breath sounds right lung base. Neuro exam intact Continue supportive care until crisis subsides.

## 2013-08-25 ENCOUNTER — Inpatient Hospital Stay (HOSPITAL_COMMUNITY): Payer: Medicare Other

## 2013-08-25 ENCOUNTER — Encounter (HOSPITAL_COMMUNITY): Payer: Self-pay | Admitting: Radiology

## 2013-08-25 LAB — BASIC METABOLIC PANEL
BUN: 22 mg/dL (ref 6–23)
CO2: 25 mEq/L (ref 19–32)
Calcium: 9.3 mg/dL (ref 8.4–10.5)
Chloride: 102 mEq/L (ref 96–112)
Creatinine, Ser: 0.95 mg/dL (ref 0.50–1.35)
GFR calc Af Amer: >90 mL/min (ref >90)

## 2013-08-25 LAB — CBC
HCT: 20.5 % — ABNORMAL LOW (ref 39.0–52.0)
MCH: 30.5 pg (ref 26.0–34.0)
MCHC: 31.7 g/dL (ref 30.0–36.0)
RBC: 2.13 MIL/uL — ABNORMAL LOW (ref 4.22–5.81)
RDW: 20.3 % — ABNORMAL HIGH (ref 11.5–15.5)
WBC: 14.3 10*3/uL — ABNORMAL HIGH (ref 4.0–10.5)

## 2013-08-25 LAB — RETICULOCYTES: RBC.: 2.13 MIL/uL — ABNORMAL LOW (ref 4.22–5.81)

## 2013-08-25 MED ORDER — CEFAZOLIN SODIUM-DEXTROSE 2-3 GM-% IV SOLR
2.0000 g | Freq: Once | INTRAVENOUS | Status: AC
  Administered 2013-08-25: 16:00:00 2 g via INTRAVENOUS
  Filled 2013-08-25: qty 50

## 2013-08-25 MED ORDER — LIDOCAINE HCL 1 % IJ SOLN
INTRAMUSCULAR | Status: AC
  Administered 2013-08-25: 17:00:00
  Filled 2013-08-25: qty 20

## 2013-08-25 MED ORDER — MIDAZOLAM HCL 2 MG/2ML IJ SOLN
INTRAMUSCULAR | Status: AC
  Filled 2013-08-25: qty 4

## 2013-08-25 MED ORDER — FENTANYL CITRATE 0.05 MG/ML IJ SOLN
INTRAMUSCULAR | Status: AC | PRN
  Administered 2013-08-25 (×2): 100 ug via INTRAVENOUS

## 2013-08-25 MED ORDER — LIDOCAINE-EPINEPHRINE (PF) 2 %-1:200000 IJ SOLN
INTRAMUSCULAR | Status: AC
  Administered 2013-08-25: 17:00:00
  Filled 2013-08-25: qty 20

## 2013-08-25 MED ORDER — MIDAZOLAM HCL 2 MG/2ML IJ SOLN
INTRAMUSCULAR | Status: AC | PRN
  Administered 2013-08-25: 1 mg via INTRAVENOUS
  Administered 2013-08-25: 16:00:00 2 mg via INTRAVENOUS
  Administered 2013-08-25: 1 mg via INTRAVENOUS

## 2013-08-25 MED ORDER — HEPARIN SOD (PORK) LOCK FLUSH 100 UNIT/ML IV SOLN
500.0000 [IU] | Freq: Once | INTRAVENOUS | Status: AC
  Administered 2013-08-25: 17:00:00 500 [IU] via INTRAVENOUS
  Filled 2013-08-25: qty 5

## 2013-08-25 MED ORDER — HEPARIN SODIUM (PORCINE) 5000 UNIT/ML IJ SOLN
5000.0000 [IU] | Freq: Three times a day (TID) | INTRAMUSCULAR | Status: DC
  Administered 2013-08-25 – 2013-08-28 (×9): 5000 [IU] via SUBCUTANEOUS
  Filled 2013-08-25 (×12): qty 1

## 2013-08-25 MED ORDER — HEPARIN SOD (PORK) LOCK FLUSH 100 UNIT/ML IV SOLN
INTRAVENOUS | Status: AC
  Filled 2013-08-25: qty 5

## 2013-08-25 MED ORDER — HYDROMORPHONE HCL 4 MG PO TABS
4.0000 mg | ORAL_TABLET | ORAL | Status: DC | PRN
  Administered 2013-08-25 – 2013-08-28 (×13): 4 mg via ORAL
  Filled 2013-08-25 (×13): qty 1

## 2013-08-25 MED ORDER — FENTANYL CITRATE 0.05 MG/ML IJ SOLN
INTRAMUSCULAR | Status: AC
  Filled 2013-08-25: qty 4

## 2013-08-25 NOTE — H&P (Signed)
Agree.  For port placement today. 

## 2013-08-25 NOTE — Procedures (Signed)
Procedure:  Porta-cath Access:  Juncture of right external jugular and subclavian vein Findings:  Port catheter tip at cavoatrial junction.  No PTX.  Flushed and ready for use.

## 2013-08-25 NOTE — H&P (Signed)
Referring Physician: Dr. Suanne Marker HPI: Dennis Bernard is an 27 y.o. male with PMHx of sickle cell disease with poor venous access who has previously had a left port a catheter placed here at [facility] a longtime ago. He states this recently got infected and was removed 3 months ago. He is admitted for a sickle cell crisis starting with hgb of 4.5 and today up to 6.5 Dr. Cyndie Chime suggests placing another port while he is at his peak Hgb. The patient is requesting another port as his previous lasted awhile. He denies any complications with previous sedation. He denies any chest pain or shortness of breath. He denies any fever and states his urinary symptoms have improved. He denies any active bleeding.   Past Medical History:  Past Medical History  Diagnosis Date  . Sickle cell disease   . Avascular necrosis of femur head, right   . H/O allergic rhinitis   . H/O hypokalemia   . Chronic pain syndrome   . H/O wheezing     with colds  . 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 Cholecystectomy  1995  . Portacath placement Left 07/04/2012  . Exchange transfusion  02/2008    perioperatively for shoulder surgery  . Total shoulder replacement Left 04/09/2008    Family History:  Family History  Problem Relation Age of Onset  . Sickle cell anemia Brother   . Sickle cell trait Father   . Sickle cell trait Mother   . Diabetes Mellitus II Mother   . Sickle cell anemia Paternal Uncle   . Asthma Neg Hx   . Allergic rhinitis Neg Hx     Social History:  reports that he has never smoked. He has never used smokeless tobacco. He reports that he does not drink alcohol or use illicit drugs.  Allergies: No Known Allergies  Medications:   Medication List    ASK your doctor about these medications       albuterol 108 (90 BASE) MCG/ACT inhaler  Commonly known as:  PROVENTIL HFA;VENTOLIN HFA  Inhale 1 puff into the  lungs every 6 (six) hours as needed for wheezing or shortness of breath. wheezing     diphenhydrAMINE 25 MG tablet  Commonly known as:  BENADRYL  Take 1 tablet (25 mg total) by mouth every 8 (eight) hours as needed for itching.     fluticasone 50 MCG/ACT nasal spray  Commonly known as:  FLONASE  Place 1 spray into the nose daily.     folic acid 1 MG tablet  Commonly known as:  FOLVITE  Take 1 mg by mouth every morning.     HYDROmorphone 4 MG tablet  Commonly known as:  DILAUDID  Take 1 tablet (4 mg total) by mouth every 4 (four) hours as needed.     morphine 60 MG 12 hr tablet  Commonly known as:  MS CONTIN  Take 1 tablet (60 mg total) by mouth 2 (two) times daily.     promethazine 25 MG tablet  Commonly known as:  PHENERGAN  Take 1 tablet (25 mg total) by mouth every 6 (six) hours as needed for nausea.     sennosides-docusate sodium 8.6-50 MG tablet  Commonly known as:  SENOKOT-S  Take 1 tablet by mouth 2 (two) times daily.       Please HPI for pertinent positives, otherwise complete 10 system ROS negative.  Physical Exam: BP 103/62  Pulse  87  Temp(Src) 98.5 F (36.9 C) (Oral)  Resp 14  Ht 5\' 9"  (1.753 m)  Wt 168 lb 3.2 oz (76.295 kg)  BMI 24.83 kg/m2  SpO2 100% Body mass index is 24.83 kg/(m^2).  General Appearance:  Alert, cooperative, no distress  Head:  Normocephalic, without obvious abnormality, atraumatic  Neck: Supple, symmetrical, trachea midline  Lungs:   Clear to auscultation bilaterally, no w/r/r, respirations unlabored without use of accessory muscles.  Chest Wall:  No tenderness or deformity. Port a catheter scarring.  Heart:  Regular rate and rhythm, S1, S2 normal, no murmur, rub or gallop.  Abdomen:   Soft, non-tender, non distended, (+) BS  Extremities: Extremities normal, atraumatic, no cyanosis or edema  Pulses: 2+ and symmetric  Neurologic: Normal affect, no gross deficits.   Results for orders placed during the hospital encounter of  08/07/13 (from the past 48 hour(s))  HEMOGLOBIN AND HEMATOCRIT, BLOOD     Status: Abnormal   Collection Time    08/24/13  5:00 AM      Result Value Range   Hemoglobin 6.4 (*) 13.0 - 17.0 g/dL   Comment: DELTA CHECK NOTED     POST TRANSFUSION SPECIMEN     REPEATED TO VERIFY     CRITICAL VALUE NOTED.  VALUE IS CONSISTENT WITH PREVIOUSLY REPORTED AND CALLED VALUE.   HCT 19.1 (*) 39.0 - 52.0 %  CBC     Status: Abnormal   Collection Time    08/25/13  4:35 AM      Result Value Range   WBC 14.3 (*) 4.0 - 10.5 K/uL   Comment: WHITE COUNT CONFIRMED ON SMEAR     ADJUSTED FOR NUCLEATED RBC'S   RBC 2.13 (*) 4.22 - 5.81 MIL/uL   Hemoglobin 6.5 (*) 13.0 - 17.0 g/dL   Comment: REPEATED TO VERIFY     CRITICAL VALUE NOTED.  VALUE IS CONSISTENT WITH PREVIOUSLY REPORTED AND CALLED VALUE.   HCT 20.5 (*) 39.0 - 52.0 %   MCV 96.2  78.0 - 100.0 fL   MCH 30.5  26.0 - 34.0 pg   MCHC 31.7  30.0 - 36.0 g/dL   RDW 78.2 (*) 95.6 - 21.3 %   Platelets 716 (*) 150 - 400 K/uL  RETICULOCYTES     Status: Abnormal   Collection Time    08/25/13  4:35 AM      Result Value Range   Retic Ct Pct 21.6 (*) 0.4 - 3.1 %   RBC. 2.13 (*) 4.22 - 5.81 MIL/uL   Retic Count, Manual 460.1 (*) 19.0 - 186.0 K/uL  BASIC METABOLIC PANEL     Status: None   Collection Time    08/25/13  4:35 AM      Result Value Range   Sodium 135  135 - 145 mEq/L   Potassium 5.0  3.5 - 5.1 mEq/L   Chloride 102  96 - 112 mEq/L   CO2 25  19 - 32 mEq/L   Glucose, Bld 90  70 - 99 mg/dL   BUN 22  6 - 23 mg/dL   Creatinine, Ser 0.86  0.50 - 1.35 mg/dL   Calcium 9.3  8.4 - 57.8 mg/dL   GFR calc non Af Amer >90  >90 mL/min   GFR calc Af Amer >90  >90 mL/min   Comment: (NOTE)     The eGFR has been calculated using the CKD EPI equation.     This calculation has not been validated in all clinical situations.  eGFR's persistently <90 mL/min signify possible Chronic Kidney     Disease.   No results found.  Assessment/Plan Sickle cell disease  and crisis, hgb 6.5 this is the patient's peak value Poor venous access, has right arm PICC in place, need port for longterm access. Previous left port a catheter, patient had for a long time. Recently removed 3 months ago secondary to infection. UTI 12/4 on antibiotics, afebrile. Leukocytosis, improving on solu-medrol.  Request for image guided port a catheter placement. Patient has been NPO, sq heparin last given 0530, on Cipro BID, labs reviewed. Risks and Benefits discussed with the patient. All of the patient's questions were answered, patient is agreeable to proceed. Consent signed and in chart.   Pattricia Boss D PA-C 08/25/2013, 12:59 PM

## 2013-08-25 NOTE — Progress Notes (Signed)
PAtient back from IR post porta-cath placement, site dressing clean/dry/intact. C/O pain PRN dilaudid given, vital sign WNL will continue to assess patient.

## 2013-08-25 NOTE — Progress Notes (Signed)
He is holding on to the blood given the other day. Hemoglobin stable at 6.5 g. No signs of a delayed hemolytic transfusion reaction. Low back pain finally starting to subside. No new complaints. Exam is stable. He would like to have another Port-A-Cath placed. Best to do this at this time when hemoglobin is at its peak value.

## 2013-08-25 NOTE — Progress Notes (Signed)
NUTRITION FOLLOW UP  Intervention:   Continue Ensure Complete TID Discontinue Ensure Pudding once daily Continue Juven BID; contains glutamine and arginine Continue Multivitamin with minerals daily  Nutrition Dx:   Inadequate oral intake related to poor appetite as evidenced by pt's report; ongoing  Goal:   Pt to meet >/= 90% of their estimated nutrition needs; not met   Monitor:   PO intake; 0-100% of meals,  2 meals daily Weight; 8 lb weight loss since admission; 2 lbs below usual body weight Labs; low hemoglobin, elevated LDH, albumin low, high ferritin  Assessment:   27 y.o. male with h/o sickle cell anemia, frequent admissions for crisis. Patient presents with typical low back pain symptoms from sickle cell crisis ongoing for the past 3 days. Developed chest pain and SOB. Pt states that his appetite has improved some and he is not eating 2 meals daily. He states he still likes the Ensure supplements and is drinking them daily. Encouraged PO intake.  Weight has dropped 8 lbs since admission, 2 lbs wt loss in the past 3 days. Continue to encourage PO intake.  Pt dislikes Juven per RN. Discussed potential benefits with pt and he is willing to continue taking Juven for now.   Height: Ht Readings from Last 1 Encounters:  08/07/13 5\' 9"  (1.753 m)    Weight Status:   Wt Readings from Last 1 Encounters:  08/25/13 168 lb 3.2 oz (76.295 kg)    Re-estimated needs:  Kcal: 2000-2200  Protein: 100-110 grams  Fluid: 2-2.2 L/day  Skin: intact; non-pitting RUE, RLE, and LLE edema  Diet Order: NPO   Intake/Output Summary (Last 24 hours) at 08/25/13 1503 Last data filed at 08/25/13 1300  Gross per 24 hour  Intake   1260 ml  Output   2225 ml  Net   -965 ml    Last BM: 12/11   Labs:   Recent Labs Lab 08/22/13 0400 08/23/13 0615 08/25/13 0435  NA 136 135 135  K 4.7 4.8 5.0  CL 102 101 102  CO2 23 24 25   BUN 12 15 22   CREATININE 0.88 0.96 0.95  CALCIUM 9.4 9.3 9.3   GLUCOSE 105* 99 90    CBG (last 3)  No results found for this basename: GLUCAP,  in the last 72 hours  Scheduled Meds: . acetaminophen  650 mg Oral Once  .  ceFAZolin (ANCEF) IV  2 g Intravenous Once  . ciprofloxacin  500 mg Oral BID  . diphenhydrAMINE  50 mg Oral Once  . feeding supplement (ENSURE COMPLETE)  237 mL Oral TID BM  . feeding supplement (ENSURE)  1 Container Oral Q24H  . fluticasone  1 spray Each Nare Daily  . folic acid  1 mg Oral Daily  . heparin  5,000 Units Subcutaneous Q8H  . HYDROmorphone PCA 0.3 mg/mL   Intravenous Q4H  . methylPREDNISolone (SOLU-MEDROL) injection  40 mg Intravenous Once  . morphine  90 mg Oral BID  . multivitamin with minerals  1 tablet Oral Daily  . naproxen  500 mg Oral BID WC  . nutrition supplement (JUVEN)  1 packet Oral BID  . pantoprazole  40 mg Oral Daily  . polyethylene glycol  17 g Oral BID  . senna-docusate  1 tablet Oral BID  . sodium chloride  10-40 mL Intracatheter Q12H    Continuous Infusions: . sodium chloride 20 mL/hr at 08/25/13 1257    Ian Malkin RD, LDN Inpatient Clinical Dietitian Pager: [phone] After Hours  Pager: [phone]  

## 2013-08-25 NOTE — Progress Notes (Signed)
[facility] PROGRESS NOTE   Dennis Bernard YQM:[id number] DOB: 1986-05-29 DOA: 08/07/2013 PCP: No primary provider on file.  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 Concerning today is that he is having worsening of his back pain, feeling like his crisis is again active. - Hb < 5, new units in the blood bank even less compatible than previous units. Some improvement on his own from 4.1>>4.6>>4.7>>4.9>4.2(12/10), - transfused  2U PRBCs with premedication, and hgb 6.2  12/11, stable at 6.5 today - It's reasonable that If he holds > 5 for 2 days he can probably go home if tolerable pain control by mouth meds, appreciate hematology input as well.  Sickle cell pain and crisis  - high reticulocytes  CXR negative for findings concerning for acute chest at this time, repeated CXR 12/7 without acute changes.  - Has been transfused 6 units of packed red blood cells so far. last 2 units of blood transfused on 12/10 - Continue current management with LA morphine and Dilaudid PCA for now -wean off his PCA as planned , continue oral morphine and resume his oral Dilaudid when necessary   patient will need to be premedicated with Solu-Medrol 40 IV, Tylenol 650 by mouth and Benadryl 50 by mouth prior to all transfusions  - continue scheduled Naprosyn - Continue Benadryl when necessary -IR consulted for Port-A-Cath placement  Pseudomonas urinary tract infection - Urine culture is growing pseudomonas aeruginosa greater than 10 to the fifth colonies per mL. Was initially treated with Cefpime 12/3, after sensitivities became  available on 12/4 was transitioned to oral Ciprofloxacin. -continue cipro to complete  a 14-day course Fracture of phalanx of left ring finger  Continue immobilization and Pain control.  - appreciate ortho input- per Dr Amanda Pea conservative management for  now.  Fever/Leukocytosis  - Multifactorial due to #1 and also UTI, also delayed transfusion reaction - chest x-ray on admission with no airspace disease - Admission blood cultures no growth to date  - Chest x-ray 12/1 with increased perihilar markings and central peribronchial thickening consistent with bronchitis and or edema  - CXR 12/7 with similar appearance of chronic interstitial coarsening without any acute findings. Constipation -d/t narcotics -Continue miralax  senekot, also prn dulcolax   Diet: Regular diet Fluids: Normal saline DVT Prophylaxis: Heparin  Code Status: Full code Family Communication: None  Disposition Plan: Inpatient, home when medically ready  Consultants:  Oncology  Orthopedic surgery  Procedures:  none   Antibiotics  Anti-infectives   Start     Dose/Rate Route Frequency Ordered Stop   08/17/13 1000  ciprofloxacin (CIPRO) tablet 500 mg     500 mg Oral 2 times daily 08/17/13 0821     08/16/13 0900  ceFEPIme (MAXIPIME) 2 g in dextrose 5 % 50 mL IVPB  Status:  Discontinued     2 g 100 mL/hr over 30 Minutes Intravenous Every 12 hours 08/16/13 0836 08/17/13 0821   08/14/13 1400  azithromycin (ZITHROMAX) tablet 500 mg  Status:  Discontinued     500 mg Oral Daily 08/14/13 1241 08/16/13 0813     Antibiotics Given (last 72 hours)   Date/Time Action Medication Dose   08/22/13 2007 Given   ciprofloxacin (CIPRO) tablet 500 mg 500 mg   08/23/13 0830 Given   ciprofloxacin (CIPRO) tablet 500 mg 500 mg   08/23/13 1932 Given   ciprofloxacin (CIPRO) tablet 500 mg 500 mg  08/24/13 0838 Given   ciprofloxacin (CIPRO) tablet 500 mg 500 mg   08/24/13 2052 Given   ciprofloxacin (CIPRO) tablet 500 mg 500 mg   08/25/13 8295 Given   ciprofloxacin (CIPRO) tablet 500 mg 500 mg      HPI/Subjective: - Denies any new complaints, would like to have Port-A-Cath(had it in the past but got infected and was DC'd about 3 months ago)   Objective: Filed Vitals:    08/25/13 0159 08/25/13 0400 08/25/13 0515 08/25/13 0729  BP: 101/63  103/62   Pulse: 91  87   Temp: 98.4 F (36.9 C)  98.5 F (36.9 C)   TempSrc: Oral  Oral   Resp: 18 14 16 14   Height:      Weight:   76.295 kg (168 lb 3.2 oz)   SpO2: 99% 99% 99% 100%    Intake/Output Summary (Last 24 hours) at 08/25/13 1034 Last data filed at 08/25/13 0600  Gross per 24 hour  Intake   1740 ml  Output   2225 ml  Net   -485 ml   Filed Weights   08/23/13 0634 08/24/13 0624 08/25/13 0515  Weight: 77.066 kg (169 lb 14.4 oz) 77.1 kg (169 lb 15.6 oz) 76.295 kg (168 lb 3.2 oz)    Exam:   General:  NAD  Cardiovascular: regular rate and rhythm, without MRG  Respiratory: good air movement, clear to auscultation throughout, no wheezing, ronchi or rales  Abdomen: soft, not tender to palpation, positive bowel sounds  Extremities: no peripheral edema  Neuro: Nonfocal  Data Reviewed: Basic Metabolic Panel:  Recent Labs Lab 08/22/13 0400 08/23/13 0615 08/25/13 0435  NA 136 135 135  K 4.7 4.8 5.0  CL 102 101 102  CO2 23 24 25   GLUCOSE 105* 99 90  BUN 12 15 22   CREATININE 0.88 0.96 0.95  CALCIUM 9.4 9.3 9.3   Liver Function Tests:  Recent Labs Lab 08/20/13 0348 08/23/13 0615  AST 26 19  ALT 26 16  ALKPHOS 222* 186*  BILITOT 2.6* 1.7*  PROT 7.9 7.2  ALBUMIN 3.6 3.1*   CBC:  Recent Labs Lab 08/20/13 0348 08/21/13 0415 08/22/13 0400 08/23/13 0615 08/24/13 0500 08/25/13 0435  WBC 25.7* 24.3* 19.8* 16.9*  --  14.3*  NEUTROABS  --  14.6*  --   --   --   --   HGB 4.6* 4.7* 4.9* 4.2* 6.4* 6.5*  HCT 14.2* 14.8* 16.1* 12.5* 19.1* 20.5*  MCV 98.6 102.1* 101.9* 98.4  --  96.2  PLT 608* 641* 614* 591*  --  716*   No results found for this or any previous visit (from the past 240 hour(s)).   Studies: Dg Lumbar Spine 2-3 Views  08/23/2013   CLINICAL DATA:  Sickle cell crisis. Persistent lumbosacral pain over the last 2 weeks.  EXAM: LUMBAR SPINE - 2-3 VIEW  COMPARISON:   Lumbar spine radiographs 05/17/2013.  FINDINGS: Typical changes of sickle cell anemia are noted within the lumbosacral spine. Alignment is stable. The soft tissues are unremarkable. The femoral heads are not visualized today.  IMPRESSION: A  1. Stable chronic changes of sickle cell disease in the lumbosacral spine. 2. No acute abnormality or significant interval change.   Electronically Signed   By: Gennette Pac M.D.   On: 08/23/2013 11:11    Scheduled Meds: . acetaminophen  650 mg Oral Once  . ciprofloxacin  500 mg Oral BID  . diphenhydrAMINE  50 mg Oral Once  . feeding supplement (  ENSURE COMPLETE)  237 mL Oral TID BM  . feeding supplement (ENSURE)  1 Container Oral Q24H  . fluticasone  1 spray Each Nare Daily  . folic acid  1 mg Oral Daily  . heparin  5,000 Units Subcutaneous Q8H  . HYDROmorphone PCA 0.3 mg/mL   Intravenous Q4H  . methylPREDNISolone (SOLU-MEDROL) injection  40 mg Intravenous Once  . morphine  90 mg Oral BID  . multivitamin with minerals  1 tablet Oral Daily  . naproxen  500 mg Oral BID WC  . nutrition supplement (JUVEN)  1 packet Oral BID  . pantoprazole  40 mg Oral Daily  . polyethylene glycol  17 g Oral BID  . senna-docusate  1 tablet Oral BID  . sodium chloride  10-40 mL Intracatheter Q12H   Continuous Infusions: . sodium chloride 20 mL/hr at 08/24/13 1142    Principal Problem:   Sickle cell pain crisis Active Problems:   Sickle cell anemia   Fracture of phalanx of left ring finger  Time spent: 35   Donnalee Curry, MD [facility] Pager [phone]. If 7 PM - 7 AM, please contact night-coverage at [email or web address], password [facility] 08/25/2013, 10:34 AM  LOS: 18 days

## 2013-08-26 DIAGNOSIS — J209 Acute bronchitis, unspecified: Secondary | ICD-10-CM

## 2013-08-26 DIAGNOSIS — N390 Urinary tract infection, site not specified: Secondary | ICD-10-CM

## 2013-08-26 LAB — CBC
HCT: 19.3 % — ABNORMAL LOW (ref 39.0–52.0)
Hemoglobin: 6.4 g/dL — CL (ref 13.0–17.0)
MCH: 31.4 pg (ref 26.0–34.0)
MCV: 94.6 fL (ref 78.0–100.0)
Platelets: 659 10*3/uL — ABNORMAL HIGH (ref 150–400)
RDW: 18.8 % — ABNORMAL HIGH (ref 11.5–15.5)
WBC: 14.3 10*3/uL — ABNORMAL HIGH (ref 4.0–10.5)

## 2013-08-26 LAB — BASIC METABOLIC PANEL
BUN: 22 mg/dL (ref 6–23)
CO2: 24 mEq/L (ref 19–32)
Calcium: 9.3 mg/dL (ref 8.4–10.5)
Chloride: 99 mEq/L (ref 96–112)
Creatinine, Ser: 0.96 mg/dL (ref 0.50–1.35)
GFR calc Af Amer: >90 mL/min (ref >90)
Sodium: 133 mEq/L — ABNORMAL LOW (ref 135–145)

## 2013-08-26 MED ORDER — HYDROMORPHONE HCL PF 1 MG/ML IJ SOLN: 1.0000 mg | INTRAMUSCULAR | Status: AC | PRN

## 2013-08-26 NOTE — Progress Notes (Signed)
[facility] PROGRESS NOTE   Dennis Bernard WNU:[id number] DOB: May 20, 1986 DOA: 08/07/2013 PCP: No primary provider on file.  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 Concerning today is that he is having worsening of his back pain, feeling like his crisis is again active. - Hb < 5, new units in the blood bank even less compatible than previous units. Some improvement on his own from 4.1>>4.6>>4.7>>4.9>4.2(12/10), - transfused  2U PRBCs with premedication, and hgb 6.2  12/11, stable at 6.4 today - It's reasonable that If he holds > 5 for 2 days he can probably go home if tolerable pain control by mouth meds, appreciate hematology input as well.  -pt still requiring iv meds for pain control today -see as discussed below Sickle cell pain and crisis  - high reticulocytes  CXR negative for findings concerning for acute chest at this time, repeated CXR 12/7 without acute changes.  - Has been transfused 6 units of packed red blood cells so far. last 2 units of blood transfused on 12/10 -will place on dilaudid IV prn for the next 12-14hrs, will dc PCA , continue oral morphine and oral Dilaudid when necessary   patient will need to be premedicated with Solu-Medrol 40 IV, Tylenol 650 by mouth and Benadryl 50 by mouth prior to all transfusions  - continue scheduled Naprosyn - Continue Benadryl when necessary - appreciate IR assistance, s/p Port-A-Cath placement 12/12 Pseudomonas urinary tract infection - Urine culture is growing pseudomonas aeruginosa greater than 10 to the fifth colonies per mL. Was initially treated with Cefpime 12/3, after sensitivities became  available on 12/4 was transitioned to oral Ciprofloxacin. -continue cipro to complete  a 14-day course Fracture of phalanx of left ring finger  Continue Pain control, conservative.  -appreciate ortho input- per  Dr Amanda Pea conservative management for now.  Fever/Leukocytosis  - Multifactorial due to #1 and also UTI, also delayed transfusion reaction - chest x-ray on admission with no airspace disease - Admission blood cultures no growth to date  - Chest x-ray 12/1 with increased perihilar markings and central peribronchial thickening consistent with bronchitis and or edema  - CXR 12/7 with similar appearance of chronic interstitial coarsening without any acute findings. Constipation -d/t narcotics -Continue miralax  senekot, also prn dulcolax   Diet: Regular diet Fluids: Normal saline DVT Prophylaxis: Heparin  Code Status: Full code Family Communication: None  Disposition Plan: Inpatient, home when medically ready  Consultants:  Oncology  Orthopedic surgery  Procedures:  none   Antibiotics  Anti-infectives   Start     Dose/Rate Route Frequency Ordered Stop   08/25/13 1300  ceFAZolin (ANCEF) IVPB 2 g/50 mL premix    Comments:  ON CALL TO IR FOR PROCEDURE   2 g 100 mL/hr over 30 Minutes Intravenous  Once 08/25/13 1257 08/25/13 1617   08/17/13 1000  ciprofloxacin (CIPRO) tablet 500 mg     500 mg Oral 2 times daily 08/17/13 0821     08/16/13 0900  ceFEPIme (MAXIPIME) 2 g in dextrose 5 % 50 mL IVPB  Status:  Discontinued     2 g 100 mL/hr over 30 Minutes Intravenous Every 12 hours 08/16/13 0836 08/17/13 0821   08/14/13 1400  azithromycin (ZITHROMAX) tablet 500 mg  Status:  Discontinued     500 mg Oral Daily 08/14/13 1241 08/16/13 0813     Antibiotics Given (last 72 hours)   Date/Time  Action Medication Dose Rate   08/23/13 1932 Given   ciprofloxacin (CIPRO) tablet 500 mg 500 mg    08/24/13 1610 Given   ciprofloxacin (CIPRO) tablet 500 mg 500 mg    08/24/13 2052 Given   ciprofloxacin (CIPRO) tablet 500 mg 500 mg    08/25/13 9604 Given   ciprofloxacin (CIPRO) tablet 500 mg 500 mg    08/25/13 1547 Given   ceFAZolin (ANCEF) IVPB 2 g/50 mL premix 2 g 100 mL/hr   08/25/13 1942  Given   ciprofloxacin (CIPRO) tablet 500 mg 500 mg    08/26/13 0900 Given   ciprofloxacin (CIPRO) tablet 500 mg 500 mg       HPI/Subjective: - states back/sickle crisis pain now much improved- c/o soreness around portacath  Objective: Filed Vitals:   08/26/13 0747 08/26/13 1200 08/26/13 1453 08/26/13 1600  BP:   112/66   Pulse:   116   Temp:   99.5 F (37.5 C)   TempSrc:   Oral   Resp: 13 16 18 19   Height:      Weight:      SpO2: 95%  95% 96%    Intake/Output Summary (Last 24 hours) at 08/26/13 1703 Last data filed at 08/26/13 1500  Gross per 24 hour  Intake 1990.93 ml  Output   2900 ml  Net -909.07 ml   Filed Weights   08/24/13 0624 08/25/13 0515 08/26/13 0500  Weight: 77.1 kg (169 lb 15.6 oz) 76.295 kg (168 lb 3.2 oz) 76.658 kg (169 lb)    Exam:   General:  NAD  Cardiovascular: regular rate and rhythm, without MRG. R. chest wall with port-a-cath in place- no erythema and no drainage, appropriately tender  Respiratory: good air movement, clear to auscultation throughout, no wheezing, ronchi or rales  Abdomen: soft, not tender to palpation, positive bowel sounds  Extremities: no peripheral edema  Neuro: Nonfocal  Data Reviewed: Basic Metabolic Panel:  Recent Labs Lab 08/22/13 0400 08/23/13 0615 08/25/13 0435 08/26/13 0500  NA 136 135 135 133*  K 4.7 4.8 5.0 4.5  CL 102 101 102 99  CO2 23 24 25 24   GLUCOSE 105* 99 90 88  BUN 12 15 22 22   CREATININE 0.88 0.96 0.95 0.96  CALCIUM 9.4 9.3 9.3 9.3   Liver Function Tests:  Recent Labs Lab 08/20/13 0348 08/23/13 0615  AST 26 19  ALT 26 16  ALKPHOS 222* 186*  BILITOT 2.6* 1.7*  PROT 7.9 7.2  ALBUMIN 3.6 3.1*   CBC:  Recent Labs Lab 08/21/13 0415 08/22/13 0400 08/23/13 0615 08/24/13 0500 08/25/13 0435 08/26/13 0500  WBC 24.3* 19.8* 16.9*  --  14.3* 14.3*  NEUTROABS 14.6*  --   --   --   --   --   HGB 4.7* 4.9* 4.2* 6.4* 6.5* 6.4*  HCT 14.8* 16.1* 12.5* 19.1* 20.5* 19.3*  MCV 102.1*  101.9* 98.4  --  96.2 94.6  PLT 641* 614* 591*  --  716* 659*   No results found for this or any previous visit (from the past 240 hour(s)).   Studies: Ir Fluoro Guide Cv Line Right  08/25/2013   CLINICAL DATA:  Sickle cell anemia and need for porta cath for long-term IV access needs.  EXAM: IMPLANTED PORT A CATH PLACEMENT WITH ULTRASOUND AND FLUOROSCOPIC GUIDANCE  ANESTHESIA/SEDATION: 4.0 Mg IV Versed; 200 mcg IV Fentanyl  Total Moderate Sedation Time:  45 minutes  Additional Medications: 2 g IV Ancef. As antibiotic prophylaxis, Ancef was ordered  pre-procedure and administered intravenously within one hour of incision.  FLUOROSCOPY TIME:  1 min and 12 seconds.  PROCEDURE: The procedure, risks, benefits, and alternatives were explained to the patient. Questions regarding the procedure were encouraged and answered. The patient understands and consents to the procedure.  The right neck and chest were prepped with chlorhexidine in a sterile fashion, and a sterile drape was applied covering the operative field. Maximum barrier sterile technique with sterile gowns and gloves were used for the procedure. Local anesthesia was provided with 1% lidocaine.  After creating a small venotomy incision, a 21 gauge needle was advanced into the right jugular vein under direct, real-time ultrasound guidance. Ultrasound image documentation was performed. After securing guidewire access, an 8 Fr dilator was placed. A J-wire was kinked to measure appropriate catheter length.  A subcutaneous port pocket was then created along the upper chest wall utilizing sharp and blunt dissection. Portable cautery was utilized. The pocket was irrigated with sterile saline.  A single lumen power injectable port was chosen for placement. The 8 Fr catheter was tunneled from the port pocket site to the venotomy incision. The port was placed in the pocket. External catheter was trimmed to appropriate length based on guidewire measurement.  At the  venotomy, an 8 Fr peel-away sheath was placed over a guidewire. The catheter was then placed through the sheath and the sheath removed. Final catheter positioning was confirmed and documented with a fluoroscopic spot image. The port was accessed with a needle and aspirated and flushed with heparinized saline. The needle was removed.  The venotomy and port pocket incisions were closed with subcutaneous 3-0 Monocryl and subcuticular 4-0 Vicryl. Dermabond was applied to both incisions.  Complications: None. No pneumothorax.  FINDINGS: Initial ultrasound shows a diminutive right internal jugular vein. After access, a guidewire could not be successfully advanced into the SVC. Venous access for poor placement was ultimately performed at the juncture of the external jugular vein and subclavian vein. After catheter placement, the tip lies at the cavoatrial junction. The catheter aspirates normally and is ready for immediate use.  IMPRESSION: Placement of single lumen port a cath on the right via access at the juncture of the external jugular and subclavian veins. The catheter tip lies at the cavoatrial junction. A power injectable port a cath was placed and is ready for immediate use.   Electronically Signed   By: Irish Lack M.D.   On: 08/25/2013 17:13   Ir US Guide Vasc Access Right  08/25/2013   CLINICAL DATA:  Sickle cell anemia and need for porta cath for long-term IV access needs.  EXAM: IMPLANTED PORT A CATH PLACEMENT WITH ULTRASOUND AND FLUOROSCOPIC GUIDANCE  ANESTHESIA/SEDATION: 4.0 Mg IV Versed; 200 mcg IV Fentanyl  Total Moderate Sedation Time:  45 minutes  Additional Medications: 2 g IV Ancef. As antibiotic prophylaxis, Ancef was ordered pre-procedure and administered intravenously within one hour of incision.  FLUOROSCOPY TIME:  1 min and 12 seconds.  PROCEDURE: The procedure, risks, benefits, and alternatives were explained to the patient. Questions regarding the procedure were encouraged and answered.  The patient understands and consents to the procedure.  The right neck and chest were prepped with chlorhexidine in a sterile fashion, and a sterile drape was applied covering the operative field. Maximum barrier sterile technique with sterile gowns and gloves were used for the procedure. Local anesthesia was provided with 1% lidocaine.  After creating a small venotomy incision, a 21 gauge needle was advanced into the  right jugular vein under direct, real-time ultrasound guidance. Ultrasound image documentation was performed. After securing guidewire access, an 8 Fr dilator was placed. A J-wire was kinked to measure appropriate catheter length.  A subcutaneous port pocket was then created along the upper chest wall utilizing sharp and blunt dissection. Portable cautery was utilized. The pocket was irrigated with sterile saline.  A single lumen power injectable port was chosen for placement. The 8 Fr catheter was tunneled from the port pocket site to the venotomy incision. The port was placed in the pocket. External catheter was trimmed to appropriate length based on guidewire measurement.  At the venotomy, an 8 Fr peel-away sheath was placed over a guidewire. The catheter was then placed through the sheath and the sheath removed. Final catheter positioning was confirmed and documented with a fluoroscopic spot image. The port was accessed with a needle and aspirated and flushed with heparinized saline. The needle was removed.  The venotomy and port pocket incisions were closed with subcutaneous 3-0 Monocryl and subcuticular 4-0 Vicryl. Dermabond was applied to both incisions.  Complications: None. No pneumothorax.  FINDINGS: Initial ultrasound shows a diminutive right internal jugular vein. After access, a guidewire could not be successfully advanced into the SVC. Venous access for poor placement was ultimately performed at the juncture of the external jugular vein and subclavian vein. After catheter placement, the  tip lies at the cavoatrial junction. The catheter aspirates normally and is ready for immediate use.  IMPRESSION: Placement of single lumen port a cath on the right via access at the juncture of the external jugular and subclavian veins. The catheter tip lies at the cavoatrial junction. A power injectable port a cath was placed and is ready for immediate use.   Electronically Signed   By: Irish Lack M.D.   On: 08/25/2013 17:13    Scheduled Meds: . acetaminophen  650 mg Oral Once  . ciprofloxacin  500 mg Oral BID  . diphenhydrAMINE  50 mg Oral Once  . feeding supplement (ENSURE COMPLETE)  237 mL Oral TID BM  . fluticasone  1 spray Each Nare Daily  . folic acid  1 mg Oral Daily  . heparin  5,000 Units Subcutaneous Q8H  . HYDROmorphone PCA 0.3 mg/mL   Intravenous Q4H  . methylPREDNISolone (SOLU-MEDROL) injection  40 mg Intravenous Once  . morphine  90 mg Oral BID  . multivitamin with minerals  1 tablet Oral Daily  . naproxen  500 mg Oral BID WC  . nutrition supplement (JUVEN)  1 packet Oral BID  . pantoprazole  40 mg Oral Daily  . polyethylene glycol  17 g Oral BID  . senna-docusate  1 tablet Oral BID  . sodium chloride  10-40 mL Intracatheter Q12H   Continuous Infusions: . sodium chloride 20 mL/hr at 08/25/13 1257    Principal Problem:   Sickle cell pain crisis Active Problems:   Sickle cell anemia   Fracture of phalanx of left ring finger  Time spent: 25   Donnalee Curry, MD [facility] Pager [phone]. If 7 PM - 7 AM, please contact night-coverage at [email or web address], password [facility] 08/26/2013, 5:03 PM  LOS: 19 days

## 2013-08-27 DIAGNOSIS — Z5189 Encounter for other specified aftercare: Secondary | ICD-10-CM

## 2013-08-27 LAB — CBC
HCT: 18.6 % — ABNORMAL LOW (ref 39.0–52.0)
Hemoglobin: 6 g/dL — CL (ref 13.0–17.0)
MCHC: 32.3 g/dL (ref 30.0–36.0)
MCV: 93.5 fL (ref 78.0–100.0)
Platelets: 602 10*3/uL — ABNORMAL HIGH (ref 150–400)
RBC: 1.99 MIL/uL — ABNORMAL LOW (ref 4.22–5.81)
RDW: 16.6 % — ABNORMAL HIGH (ref 11.5–15.5)
WBC: 12.2 10*3/uL — ABNORMAL HIGH (ref 4.0–10.5)

## 2013-08-27 MED ORDER — HYDROMORPHONE HCL PF 1 MG/ML IJ SOLN
1.0000 mg | INTRAMUSCULAR | Status: AC | PRN
  Administered 2013-08-27 (×3): 1 mg via INTRAVENOUS
  Administered 2013-08-28: 2 mg via INTRAVENOUS
  Filled 2013-08-27: qty 2
  Filled 2013-08-27 (×2): qty 1
  Filled 2013-08-27: qty 2

## 2013-08-27 NOTE — Progress Notes (Signed)
[facility] PROGRESS NOTE   Dennis Bernard EAV:[id number] DOB: 1986/09/02 DOA: 08/07/2013 PCP: No primary provider on file.  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 Concerning today is that he is having worsening of his back pain, feeling like his crisis is again active. - Hb < 5, new units in the blood bank even less compatible than previous units. Some improvement on his own from 4.1>>4.6>>4.7>>4.9>4.2(12/10), - transfused  2U PRBCs with premedication, and hgb 6.2  12/11, stable at 6.0 today - It's reasonable that If he continues to hold > 5 for 2 days and if tolerable pain control by mouth meds, will plan dc in next 1-2days. appreciate hematology input as well.  -pt still requiring iv meds for pain control today -see as discussed below Sickle cell pain and crisis  - high reticulocytes  CXR negative for findings concerning for acute chest at this time, repeated CXR 12/7 without acute changes.  - Has been transfused 6 units of packed red blood cells so far. last 2 units of blood transfused on 12/10 -will dc PCA(was weaned down but not dc'ed yesterday),will place on dilaudid IV prn for the next 12hrs,  continue oral morphine and oral Dilaudid when necessary   patient will need to be premedicated with Solu-Medrol 40 IV, Tylenol 650 by mouth and Benadryl 50 by mouth prior to all transfusions  - continue scheduled Naprosyn - Continue Benadryl when necessary - s/p Port-A-Cath placement 12/12 Pseudomonas urinary tract infection - Urine culture is growing pseudomonas aeruginosa greater than 10 to the fifth colonies per mL. Was initially treated with Cefpime 12/3, after sensitivities became  available on 12/4 was transitioned to oral Ciprofloxacin. -continue cipro to complete  a 14-day course Fracture of phalanx of left ring finger  Continue Pain control,  conservative.  -appreciate ortho input- per Dr Amanda Pea conservative management for now.  Fever/Leukocytosis  - Multifactorial due to #1 and also UTI, also delayed transfusion reaction - chest x-ray on admission with no airspace disease - Admission blood cultures no growth to date  - Chest x-ray 12/1 with increased perihilar markings and central peribronchial thickening consistent with bronchitis and or edema  - CXR 12/7 with similar appearance of chronic interstitial coarsening without any acute findings. Constipation -d/t narcotics -Continue miralax  senekot, also prn dulcolax   Diet: Regular diet DVT Prophylaxis: Heparin  Code Status: Full code Family Communication: None  Disposition Plan: Inpatient, home when medically ready  Consultants:  Oncology  Orthopedic surgery  Procedures:  none   Antibiotics  Anti-infectives   Start     Dose/Rate Route Frequency Ordered Stop   08/25/13 1300  ceFAZolin (ANCEF) IVPB 2 g/50 mL premix    Comments:  ON CALL TO IR FOR PROCEDURE   2 g 100 mL/hr over 30 Minutes Intravenous  Once 08/25/13 1257 08/25/13 1617   08/17/13 1000  ciprofloxacin (CIPRO) tablet 500 mg     500 mg Oral 2 times daily 08/17/13 0821     08/16/13 0900  ceFEPIme (MAXIPIME) 2 g in dextrose 5 % 50 mL IVPB  Status:  Discontinued     2 g 100 mL/hr over 30 Minutes Intravenous Every 12 hours 08/16/13 0836 08/17/13 0821   08/14/13 1400  azithromycin (ZITHROMAX) tablet 500 mg  Status:  Discontinued     500 mg Oral Daily 08/14/13 1241 08/16/13 0813     Antibiotics Given (last 72 hours)  Date/Time Action Medication Dose Rate   08/24/13 2052 Given   ciprofloxacin (CIPRO) tablet 500 mg 500 mg    08/25/13 1610 Given   ciprofloxacin (CIPRO) tablet 500 mg 500 mg    08/25/13 1547 Given   ceFAZolin (ANCEF) IVPB 2 g/50 mL premix 2 g 100 mL/hr   08/25/13 1942 Given   ciprofloxacin (CIPRO) tablet 500 mg 500 mg    08/26/13 0900 Given   ciprofloxacin (CIPRO) tablet 500 mg 500  mg    08/26/13 2118 Given   ciprofloxacin (CIPRO) tablet 500 mg 500 mg    08/27/13 1003 Given   ciprofloxacin (CIPRO) tablet 500 mg 500 mg       HPI/Subjective: - denies any new c/o- still soreness around portacath  Objective: Filed Vitals:   08/27/13 0001 08/27/13 0222 08/27/13 0432 08/27/13 0447  BP:  105/69 107/62   Pulse:  80 103   Temp:  98.4 F (36.9 C) 98.9 F (37.2 C)   TempSrc:  Oral Oral   Resp: 18 18 20 20   Height:      Weight:   76.794 kg (169 lb 4.8 oz)   SpO2: 98% 100% 98% 98%    Intake/Output Summary (Last 24 hours) at 08/27/13 1251 Last data filed at 08/27/13 0100  Gross per 24 hour  Intake 1290.6 ml  Output    925 ml  Net  365.6 ml   Filed Weights   08/25/13 0515 08/26/13 0500 08/27/13 0432  Weight: 76.295 kg (168 lb 3.2 oz) 76.658 kg (169 lb) 76.794 kg (169 lb 4.8 oz)    Exam:   General:  NAD  Cardiovascular: regular rate and rhythm, without MRG. R. chest wall with port-a-cath in place- no erythema and no drainage, appropriately tender  Respiratory: good air movement, clear to auscultation throughout, no wheezing, ronchi or rales  Abdomen: soft, not tender to palpation, positive bowel sounds  Extremities: no peripheral edema  Neuro: Nonfocal  Data Reviewed: Basic Metabolic Panel:  Recent Labs Lab 08/22/13 0400 08/23/13 0615 08/25/13 0435 08/26/13 0500  NA 136 135 135 133*  K 4.7 4.8 5.0 4.5  CL 102 101 102 99  CO2 23 24 25 24   GLUCOSE 105* 99 90 88  BUN 12 15 22 22   CREATININE 0.88 0.96 0.95 0.96  CALCIUM 9.4 9.3 9.3 9.3   Liver Function Tests:  Recent Labs Lab 08/23/13 0615  AST 19  ALT 16  ALKPHOS 186*  BILITOT 1.7*  PROT 7.2  ALBUMIN 3.1*   CBC:  Recent Labs Lab 08/21/13 0415 08/22/13 0400 08/23/13 0615 08/24/13 0500 08/25/13 0435 08/26/13 0500  WBC 24.3* 19.8* 16.9*  --  14.3* 14.3*  NEUTROABS 14.6*  --   --   --   --   --   HGB 4.7* 4.9* 4.2* 6.4* 6.5* 6.4*  HCT 14.8* 16.1* 12.5* 19.1* 20.5* 19.3*   MCV 102.1* 101.9* 98.4  --  96.2 94.6  PLT 641* 614* 591*  --  716* 659*   No results found for this or any previous visit (from the past 240 hour(s)).   Studies: Ir Fluoro Guide Cv Line Right  08/25/2013   CLINICAL DATA:  Sickle cell anemia and need for porta cath for long-term IV access needs.  EXAM: IMPLANTED PORT A CATH PLACEMENT WITH ULTRASOUND AND FLUOROSCOPIC GUIDANCE  ANESTHESIA/SEDATION: 4.0 Mg IV Versed; 200 mcg IV Fentanyl  Total Moderate Sedation Time:  45 minutes  Additional Medications: 2 g IV Ancef. As antibiotic prophylaxis, Ancef was ordered  pre-procedure and administered intravenously within one hour of incision.  FLUOROSCOPY TIME:  1 min and 12 seconds.  PROCEDURE: The procedure, risks, benefits, and alternatives were explained to the patient. Questions regarding the procedure were encouraged and answered. The patient understands and consents to the procedure.  The right neck and chest were prepped with chlorhexidine in a sterile fashion, and a sterile drape was applied covering the operative field. Maximum barrier sterile technique with sterile gowns and gloves were used for the procedure. Local anesthesia was provided with 1% lidocaine.  After creating a small venotomy incision, a 21 gauge needle was advanced into the right jugular vein under direct, real-time ultrasound guidance. Ultrasound image documentation was performed. After securing guidewire access, an 8 Fr dilator was placed. A J-wire was kinked to measure appropriate catheter length.  A subcutaneous port pocket was then created along the upper chest wall utilizing sharp and blunt dissection. Portable cautery was utilized. The pocket was irrigated with sterile saline.  A single lumen power injectable port was chosen for placement. The 8 Fr catheter was tunneled from the port pocket site to the venotomy incision. The port was placed in the pocket. External catheter was trimmed to appropriate length based on guidewire  measurement.  At the venotomy, an 8 Fr peel-away sheath was placed over a guidewire. The catheter was then placed through the sheath and the sheath removed. Final catheter positioning was confirmed and documented with a fluoroscopic spot image. The port was accessed with a needle and aspirated and flushed with heparinized saline. The needle was removed.  The venotomy and port pocket incisions were closed with subcutaneous 3-0 Monocryl and subcuticular 4-0 Vicryl. Dermabond was applied to both incisions.  Complications: None. No pneumothorax.  FINDINGS: Initial ultrasound shows a diminutive right internal jugular vein. After access, a guidewire could not be successfully advanced into the SVC. Venous access for poor placement was ultimately performed at the juncture of the external jugular vein and subclavian vein. After catheter placement, the tip lies at the cavoatrial junction. The catheter aspirates normally and is ready for immediate use.  IMPRESSION: Placement of single lumen port a cath on the right via access at the juncture of the external jugular and subclavian veins. The catheter tip lies at the cavoatrial junction. A power injectable port a cath was placed and is ready for immediate use.   Electronically Signed   By: Irish Lack M.D.   On: 08/25/2013 17:13   Ir US Guide Vasc Access Right  08/25/2013   CLINICAL DATA:  Sickle cell anemia and need for porta cath for long-term IV access needs.  EXAM: IMPLANTED PORT A CATH PLACEMENT WITH ULTRASOUND AND FLUOROSCOPIC GUIDANCE  ANESTHESIA/SEDATION: 4.0 Mg IV Versed; 200 mcg IV Fentanyl  Total Moderate Sedation Time:  45 minutes  Additional Medications: 2 g IV Ancef. As antibiotic prophylaxis, Ancef was ordered pre-procedure and administered intravenously within one hour of incision.  FLUOROSCOPY TIME:  1 min and 12 seconds.  PROCEDURE: The procedure, risks, benefits, and alternatives were explained to the patient. Questions regarding the procedure were  encouraged and answered. The patient understands and consents to the procedure.  The right neck and chest were prepped with chlorhexidine in a sterile fashion, and a sterile drape was applied covering the operative field. Maximum barrier sterile technique with sterile gowns and gloves were used for the procedure. Local anesthesia was provided with 1% lidocaine.  After creating a small venotomy incision, a 21 gauge needle was advanced into the  right jugular vein under direct, real-time ultrasound guidance. Ultrasound image documentation was performed. After securing guidewire access, an 8 Fr dilator was placed. A J-wire was kinked to measure appropriate catheter length.  A subcutaneous port pocket was then created along the upper chest wall utilizing sharp and blunt dissection. Portable cautery was utilized. The pocket was irrigated with sterile saline.  A single lumen power injectable port was chosen for placement. The 8 Fr catheter was tunneled from the port pocket site to the venotomy incision. The port was placed in the pocket. External catheter was trimmed to appropriate length based on guidewire measurement.  At the venotomy, an 8 Fr peel-away sheath was placed over a guidewire. The catheter was then placed through the sheath and the sheath removed. Final catheter positioning was confirmed and documented with a fluoroscopic spot image. The port was accessed with a needle and aspirated and flushed with heparinized saline. The needle was removed.  The venotomy and port pocket incisions were closed with subcutaneous 3-0 Monocryl and subcuticular 4-0 Vicryl. Dermabond was applied to both incisions.  Complications: None. No pneumothorax.  FINDINGS: Initial ultrasound shows a diminutive right internal jugular vein. After access, a guidewire could not be successfully advanced into the SVC. Venous access for poor placement was ultimately performed at the juncture of the external jugular vein and subclavian vein. After  catheter placement, the tip lies at the cavoatrial junction. The catheter aspirates normally and is ready for immediate use.  IMPRESSION: Placement of single lumen port a cath on the right via access at the juncture of the external jugular and subclavian veins. The catheter tip lies at the cavoatrial junction. A power injectable port a cath was placed and is ready for immediate use.   Electronically Signed   By: Irish Lack M.D.   On: 08/25/2013 17:13    Scheduled Meds: . acetaminophen  650 mg Oral Once  . ciprofloxacin  500 mg Oral BID  . diphenhydrAMINE  50 mg Oral Once  . feeding supplement (ENSURE COMPLETE)  237 mL Oral TID BM  . fluticasone  1 spray Each Nare Daily  . folic acid  1 mg Oral Daily  . heparin  5,000 Units Subcutaneous Q8H  . methylPREDNISolone (SOLU-MEDROL) injection  40 mg Intravenous Once  . morphine  90 mg Oral BID  . multivitamin with minerals  1 tablet Oral Daily  . naproxen  500 mg Oral BID WC  . nutrition supplement (JUVEN)  1 packet Oral BID  . pantoprazole  40 mg Oral Daily  . polyethylene glycol  17 g Oral BID  . senna-docusate  1 tablet Oral BID  . sodium chloride  10-40 mL Intracatheter Q12H   Continuous Infusions: . sodium chloride 20 mL/hr at 08/25/13 1257    Principal Problem:   Sickle cell pain crisis Active Problems:   Sickle cell anemia   Fracture of phalanx of left ring finger  Time spent: 25   Donnalee Curry, MD [facility] Pager [phone]. If 7 PM - 7 AM, please contact night-coverage at [email or web address], password [facility] 08/27/2013, 12:51 PM  LOS: 20 days

## 2013-08-28 LAB — CBC
HCT: 17.4 % — ABNORMAL LOW (ref 39.0–52.0)
Hemoglobin: 5.8 g/dL — CL (ref 13.0–17.0)
MCH: 30.9 pg (ref 26.0–34.0)
MCHC: 33.3 g/dL (ref 30.0–36.0)
MCV: 92.6 fL (ref 78.0–100.0)
Platelets: 644 10*3/uL — ABNORMAL HIGH (ref 150–400)
WBC: 13.8 10*3/uL — ABNORMAL HIGH (ref 4.0–10.5)

## 2013-08-28 MED ORDER — MORPHINE SULFATE ER 60 MG PO TBCR: 60.0000 mg | EXTENDED_RELEASE_TABLET | Freq: Two times a day (BID) | ORAL | Status: DC

## 2013-08-28 MED ORDER — POLYETHYLENE GLYCOL 3350 17 G PO PACK: 17.0000 g | PACK | Freq: Two times a day (BID) | ORAL | Status: DC

## 2013-08-28 MED ORDER — OMEPRAZOLE 40 MG PO CPDR: 40.0000 mg | DELAYED_RELEASE_CAPSULE | Freq: Every day | ORAL | Status: DC

## 2013-08-28 MED ORDER — HYDROMORPHONE HCL 2 MG PO TABS
3.0000 mg | ORAL_TABLET | ORAL | Status: DC | PRN
  Administered 2013-08-28: 4 mg via ORAL
  Filled 2013-08-28: qty 2

## 2013-08-28 MED ORDER — HYDROMORPHONE HCL 4 MG PO TABS: 4.0000 mg | ORAL_TABLET | ORAL | Status: DC | PRN

## 2013-08-28 MED ORDER — NAPROXEN 500 MG PO TABS: 500.0000 mg | ORAL_TABLET | Freq: Two times a day (BID) | ORAL | Status: DC

## 2013-08-28 MED ORDER — CIPROFLOXACIN HCL 500 MG PO TABS: 500.0000 mg | ORAL_TABLET | Freq: Two times a day (BID) | ORAL | Status: DC

## 2013-08-28 NOTE — Progress Notes (Signed)
CRITICAL VALUE ALERT  Critical value received: hemoglobin 5.8  Date of notification:  08/28/14  Time of notification:  1011am  Critical value read back:yes  Nurse who received alert:  Minna Merritts  MD notified (1st page):  Dr. Suanne Marker  Time of first page:  1025AM  MD notified (2nd page):  Time of second page:  Responding MD:  Dr. Suanne Marker  Time MD responded:  [phone]

## 2013-08-28 NOTE — Discharge Summary (Signed)
Physician Discharge Summary  Dennis Bernard:[id number] DOB: 02-15-1986 DOA: 08/07/2013  PCP: No primary provider on file.  Admit date: 08/07/2013 Discharge date: 08/28/2013  Time spent: >57minutes  Recommendations for Outpatient Follow-up:  Follow-up Information   Follow up with Levert Feinstein, MD. (in 1 week, call for appt upon discharge)    Specialty:  Oncology   Contact information:   [address] [place] [address] [phone]       Follow up with Karen Chafe, MD In 1 week. (call for appt upon discharge)    Specialty:  Orthopedic Surgery   Contact information:   [address] [place] [address] [phone]        Discharge Diagnoses:  Principal Problem:   Sickle cell pain crisis Active Problems:   Sickle cell anemia   Fracture of phalanx of left ring finger   Discharge Condition: improved/stable  Diet recommendation:   Filed Weights   08/26/13 0500 08/27/13 0432 08/28/13 0513  Weight: 76.658 kg (169 lb) 76.794 kg (169 lb 4.8 oz) 77.5 kg (170 lb 13.7 oz)    History of present illness:  Dennis Bernard is a 27 y.o. male with h/o sickle cell anemia, frequent admissions for crisis. Patient presents with typical low back pain symptoms from sickle cell crisis ongoing for the past 3 days. Developed chest pain and SOB today. Pain is unrelieved by home meds. Additionally he has left ring finger pain and swelling which he woke up with this morning.   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  - Upon admission patient's hemoglobin sway monitored and transfusions were ordered as clinically indicated>> it was noted that patient does have multiple alloantibodies and premedication was ordered prior to transfusions. He had an inadequate hemoglobin rise following the first units that were transfused. Dr. Cyndie Chime  consulted and he recommended only transfuse if hemoglobin less than 5 and the him and hin to be patient  premedicated with Solu-Medrol 40 IV, Tylenol 650 by mouth and Benadryl 50 by mouth prior to all transfusions  This was done and was transfused another 2 units of packed red blood cells on 12/10 as his hemoglobin dropped to 4.2 and he responded appropriately with his hemoglobin improving to 6.2 -Followup monitoring his hemoglobin has overall remained stable 5.8 today, and per Dr. Cyndie Chime okay for outpatient followup at this time. -He'll sickle cell pain crisis is clinically improved and he was weaned off the Dilaudid PCA and transition to oral meds  -Pt has indicated that he would like to follow up outpatient with Dr. Donne Anon is to call for outpatient appointment upon discharge. Sickle cell pain and crisis  -As discussed above upon admission, patient was started on Dilaudid PCA for pain control and also maintain on MS Contin and on followup NSAID/Naprosyn was added -Admission for precipitating factors included CXR negative for findings concerning for acute chest at this time, repeated CXR 12/7 without acute changes. Also urinalysis was done which was negative followup urine cultures were positive and was initially she had with imipenem, and on for this came back he was changed to Cipro. He'll be discharged on oral Cipro to complete a 14 day course -And was slow to improve requiring a prolonged hospital course as crisis was complicated by his multiple alloantibodies as discussed above. - Has been transfused 6 units of packed red blood cells so far. last 2 units of blood  transfused on 12/10 >> hgb improved 6.2 this transfusion and is 5.8 on discharge today. -Discussed above is okay pain control on oral medications and followup today and will be discharged to follow up outpatient. -Patient had a Port-A-Cath placed on 12/12-had had one previously that was discontinued about 3 months ago due to  infection, and he requested placement of another one was in the hospital this time and he was done. Pseudomonas urinary tract infection  - Urine culture is growing pseudomonas aeruginosa greater than 10 to the fifth colonies per mL. Was initially treated with Cefpime 12/3, after sensitivities became available on 12/4 was transitioned to oral Ciprofloxacin.  -continue cipro to complete a 14-day course discussed above Fracture of phalanx of left ring finger  Continue Pain control, conservative.  - Dr Amanda Pea followed patient while in the hospital and reommended conservative management for now>> pt is to followup in one week upon discharge. Fever/Leukocytosis  - Multifactorial due to #1 and also UTI, also delayed transfusion reaction. Improved clinically on antibiotics as above and be discharged on Cipro to complete a 14 day course. - chest x-ray on admission with no airspace disease  - Admission blood cultures no growth to date  - Chest x-ray 12/1 with increased perihilar markings and central peribronchial thickening consistent with bronchitis and or edema  - CXR 12/7 with similar appearance of chronic interstitial coarsening without any acute findings.  Constipation  -d/t narcotics  -Continue miralax senekot, also prn mirilax upon discharge.     Procedures:  PORT-A -CATH placement  Consultations:  Hematology, Dr. Cyndie Chime  Discharge Exam: Filed Vitals:   08/28/13 0513  BP: 93/48  Pulse: 78  Temp: 98.5 F (36.9 C)  Resp: 20   Exam:  General: NAD  Cardiovascular: regular rate and rhythm, without MRG. R. chest wall with port-a-cath in place- no erythema and no drainage, appropriately tender  Respiratory: good air movement, clear to auscultation throughout, no wheezing, ronchi or rales  Abdomen: soft, not tender to palpation, positive bowel sounds  Extremities: no peripheral edema  Neuro: Nonfocal   Discharge Instructions  Discharge Orders   Future Orders Complete By  Expires   Diet general  As directed    Increase activity slowly  As directed        Medication List         albuterol 108 (90 BASE) MCG/ACT inhaler  Commonly known as:  PROVENTIL HFA;VENTOLIN HFA  Inhale 1 puff into the lungs every 6 (six) hours as needed for wheezing or shortness of breath. wheezing     ciprofloxacin 500 MG tablet  Commonly known as:  CIPRO  Take 1 tablet (500 mg total) by mouth 2 (two) times daily.     diphenhydrAMINE 25 MG tablet  Commonly known as:  BENADRYL  Take 1 tablet (25 mg total) by mouth every 8 (eight) hours as needed for itching.     fluticasone 50 MCG/ACT nasal spray  Commonly known as:  FLONASE  Place 1 spray into the nose daily.     folic acid 1 MG tablet  Commonly known as:  FOLVITE  Take 1 mg by mouth every morning.     HYDROmorphone 4 MG tablet  Commonly known as:  DILAUDID  Take 1 tablet (4 mg total) by mouth every 4 (four) hours as needed.     morphine 60 MG 12 hr tablet  Commonly known as:  MS CONTIN  Take 1 tablet (60 mg total) by mouth 2 (two) times daily.  naproxen 500 MG tablet  Commonly known as:  NAPROSYN  Take 1 tablet (500 mg total) by mouth 2 (two) times daily with a meal.     omeprazole 40 MG capsule  Commonly known as:  PRILOSEC  Take 1 capsule (40 mg total) by mouth daily.     polyethylene glycol packet  Commonly known as:  MIRALAX / GLYCOLAX  Take 17 g by mouth 2 (two) times daily.     promethazine 25 MG tablet  Commonly known as:  PHENERGAN  Take 1 tablet (25 mg total) by mouth every 6 (six) hours as needed for nausea.     sennosides-docusate sodium 8.6-50 MG tablet  Commonly known as:  SENOKOT-S  Take 1 tablet by mouth 2 (two) times daily.       No Known Allergies     Follow-up Information   Follow up with Levert Feinstein, MD. (in 1 week, call for appt upon discharge)    Specialty:  Oncology   Contact information:   [address] [place] [address] [phone]       Follow up with  Karen Chafe, MD In 1 week. (call for appt upon discharge)    Specialty:  Orthopedic Surgery   Contact information:   [address] [place] [address] [phone]        The results of significant diagnostics from this hospitalization (including imaging, microbiology, ancillary and laboratory) are listed below for reference.    Significant Diagnostic Studies: Dg Chest 2 View  08/20/2013   CLINICAL DATA:  Short of breath  EXAM: CHEST  2 VIEW  COMPARISON:  08/14/2013  FINDINGS: Mild cardiac enlargement. No pleural effusions identified. Lung volumes are low. Interstitial coarsening is identified bilaterally. This appears similar to previous exam. Bony changes of sickle cell disease is noted. Previous left shoulder arthroplasty.  IMPRESSION: 1. Similar appearance of chronic interstitial coarsening 2. No acute findings noted.   Electronically Signed   By: Signa Kell M.D.   On: 08/20/2013 11:52   Dg Chest 2 View  08/07/2013   CLINICAL DATA:  Sickle cell crisis.  Pain.  EXAM: CHEST  2 VIEW  COMPARISON:  07/21/2013  FINDINGS: Low lung volumes are present, causing crowding of the pulmonary vasculature. Cardiothoracic index 55%, at least partially due to the low lung volumes, although overall mild cardiomegaly is suspected. Prior PICC line has been removed. Left proximal humeral implant.  Minimal airway thickening. No airspace opacity. Endplate findings in the thoracic spine characteristic of sickle cell disease.  IMPRESSION: 1. Mild airway thickening may reflect bronchitis or reactive airways disease. No airspace opacity is currently observed 2. Low lung volumes are present, causing crowding of the pulmonary vasculature. 3. Mild cardiomegaly.   Electronically Signed   By: Herbie Baltimore M.D.   On: 08/07/2013 19:24   Dg Lumbar Spine 2-3 Views  08/23/2013   CLINICAL DATA:  Sickle cell crisis. Persistent lumbosacral pain over the last 2 weeks.  EXAM: LUMBAR SPINE - 2-3  VIEW  COMPARISON:  Lumbar spine radiographs 05/17/2013.  FINDINGS: Typical changes of sickle cell anemia are noted within the lumbosacral spine. Alignment is stable. The soft tissues are unremarkable. The femoral heads are not visualized today.  IMPRESSION: A  1. Stable chronic changes of sickle cell disease in the lumbosacral spine. 2. No acute abnormality or significant interval change.   Electronically Signed   By: Gennette Pac M.D.   On: 08/23/2013 11:11   Dg Hand 2 View  Left  08/20/2013   CLINICAL DATA:  Fracture of the left ring finger.  EXAM: LEFT HAND - 2 VIEW  COMPARISON:  08/13/2013  FINDINGS: The comminuted impacted fracture of the base of the middle phalangeal bone of the ring finger is again noted. Dorsal fragment is displaced more than the volar fragment. There has been no significant change.  IMPRESSION: No significant change in the appearance of the impacted comminuted displaced fracture of the middle phalanx of the left ring finger.   Electronically Signed   By: Geanie Cooley M.D.   On: 08/20/2013 15:54   Ir Fluoro Guide Cv Line Right  08/25/2013   CLINICAL DATA:  Sickle cell anemia and need for porta cath for long-term IV access needs.  EXAM: IMPLANTED PORT A CATH PLACEMENT WITH ULTRASOUND AND FLUOROSCOPIC GUIDANCE  ANESTHESIA/SEDATION: 4.0 Mg IV Versed; 200 mcg IV Fentanyl  Total Moderate Sedation Time:  45 minutes  Additional Medications: 2 g IV Ancef. As antibiotic prophylaxis, Ancef was ordered pre-procedure and administered intravenously within one hour of incision.  FLUOROSCOPY TIME:  1 min and 12 seconds.  PROCEDURE: The procedure, risks, benefits, and alternatives were explained to the patient. Questions regarding the procedure were encouraged and answered. The patient understands and consents to the procedure.  The right neck and chest were prepped with chlorhexidine in a sterile fashion, and a sterile drape was applied covering the operative field. Maximum barrier sterile  technique with sterile gowns and gloves were used for the procedure. Local anesthesia was provided with 1% lidocaine.  After creating a small venotomy incision, a 21 gauge needle was advanced into the right jugular vein under direct, real-time ultrasound guidance. Ultrasound image documentation was performed. After securing guidewire access, an 8 Fr dilator was placed. A J-wire was kinked to measure appropriate catheter length.  A subcutaneous port pocket was then created along the upper chest wall utilizing sharp and blunt dissection. Portable cautery was utilized. The pocket was irrigated with sterile saline.  A single lumen power injectable port was chosen for placement. The 8 Fr catheter was tunneled from the port pocket site to the venotomy incision. The port was placed in the pocket. External catheter was trimmed to appropriate length based on guidewire measurement.  At the venotomy, an 8 Fr peel-away sheath was placed over a guidewire. The catheter was then placed through the sheath and the sheath removed. Final catheter positioning was confirmed and documented with a fluoroscopic spot image. The port was accessed with a needle and aspirated and flushed with heparinized saline. The needle was removed.  The venotomy and port pocket incisions were closed with subcutaneous 3-0 Monocryl and subcuticular 4-0 Vicryl. Dermabond was applied to both incisions.  Complications: None. No pneumothorax.  FINDINGS: Initial ultrasound shows a diminutive right internal jugular vein. After access, a guidewire could not be successfully advanced into the SVC. Venous access for poor placement was ultimately performed at the juncture of the external jugular vein and subclavian vein. After catheter placement, the tip lies at the cavoatrial junction. The catheter aspirates normally and is ready for immediate use.  IMPRESSION: Placement of single lumen port a cath on the right via access at the juncture of the external jugular and  subclavian veins. The catheter tip lies at the cavoatrial junction. A power injectable port a cath was placed and is ready for immediate use.   Electronically Signed   By: Irish Lack M.D.   On: 08/25/2013 17:13   Ir US Guide Vasc Access  Right  08/25/2013   CLINICAL DATA:  Sickle cell anemia and need for porta cath for long-term IV access needs.  EXAM: IMPLANTED PORT A CATH PLACEMENT WITH ULTRASOUND AND FLUOROSCOPIC GUIDANCE  ANESTHESIA/SEDATION: 4.0 Mg IV Versed; 200 mcg IV Fentanyl  Total Moderate Sedation Time:  45 minutes  Additional Medications: 2 g IV Ancef. As antibiotic prophylaxis, Ancef was ordered pre-procedure and administered intravenously within one hour of incision.  FLUOROSCOPY TIME:  1 min and 12 seconds.  PROCEDURE: The procedure, risks, benefits, and alternatives were explained to the patient. Questions regarding the procedure were encouraged and answered. The patient understands and consents to the procedure.  The right neck and chest were prepped with chlorhexidine in a sterile fashion, and a sterile drape was applied covering the operative field. Maximum barrier sterile technique with sterile gowns and gloves were used for the procedure. Local anesthesia was provided with 1% lidocaine.  After creating a small venotomy incision, a 21 gauge needle was advanced into the right jugular vein under direct, real-time ultrasound guidance. Ultrasound image documentation was performed. After securing guidewire access, an 8 Fr dilator was placed. A J-wire was kinked to measure appropriate catheter length.  A subcutaneous port pocket was then created along the upper chest wall utilizing sharp and blunt dissection. Portable cautery was utilized. The pocket was irrigated with sterile saline.  A single lumen power injectable port was chosen for placement. The 8 Fr catheter was tunneled from the port pocket site to the venotomy incision. The port was placed in the pocket. External catheter was trimmed  to appropriate length based on guidewire measurement.  At the venotomy, an 8 Fr peel-away sheath was placed over a guidewire. The catheter was then placed through the sheath and the sheath removed. Final catheter positioning was confirmed and documented with a fluoroscopic spot image. The port was accessed with a needle and aspirated and flushed with heparinized saline. The needle was removed.  The venotomy and port pocket incisions were closed with subcutaneous 3-0 Monocryl and subcuticular 4-0 Vicryl. Dermabond was applied to both incisions.  Complications: None. No pneumothorax.  FINDINGS: Initial ultrasound shows a diminutive right internal jugular vein. After access, a guidewire could not be successfully advanced into the SVC. Venous access for poor placement was ultimately performed at the juncture of the external jugular vein and subclavian vein. After catheter placement, the tip lies at the cavoatrial junction. The catheter aspirates normally and is ready for immediate use.  IMPRESSION: Placement of single lumen port a cath on the right via access at the juncture of the external jugular and subclavian veins. The catheter tip lies at the cavoatrial junction. A power injectable port a cath was placed and is ready for immediate use.   Electronically Signed   By: Irish Lack M.D.   On: 08/25/2013 17:13   Dg Chest Port 1 View  08/14/2013   *RADIOLOGY REPORT*  Clinical Data: Fever  PORTABLE CHEST - 1 VIEW  Comparison: 08/07/2013  Findings: Increased interstitial perihilar markings and peribronchial thickening.  Mild right suprahilar airspace opacity. No pleural effusion or pneumothorax.  Heart size upper normal. Mediastinal contours otherwise within normal range.  Left shoulder arthroplasty.  Right shoulder degenerative changes.  Right PICC has retracted, with tip projecting over the scapula. Multiple other tube/wires projecting over the patient are presumably external.  IMPRESSION: Increased perihilar  markings and central peribronchial thickening may reflect bronchitis and/or edema.  Mild right suprahilar opacity may reflect the same process or developing pneumonia.  Right PICC has retracted, with tip projecting over the right scapula.   Original Report Authenticated By: Jearld Lesch, M.D.   Dg Hand Complete Left  08/13/2013   CLINICAL DATA:  Fracture involving left 4th PIP joint, increased pain/swelling  EXAM: LEFT HAND - COMPLETE 3+ VIEW  COMPARISON:  08/07/2013  FINDINGS: Comminuted, mildly displaced fracture involving the base of the 4th middle phalanx with associated intra-articular extension, unchanged.  No additional fracture is seen.  The joint spaces are preserved.  Mild soft tissue swelling along the proximal 4th digit, unchanged.  IMPRESSION: Comminuted, mildly displaced intra-articular fracture involving the base of the 4th middle phalanx, unchanged.   Electronically Signed   By: Charline Bills M.D.   On: 08/13/2013 11:30   Dg Finger Ring Left  08/07/2013   CLINICAL DATA:  Left ring finger injury and pain.  EXAM: LEFT RING FINGER 2+V  COMPARISON:  None.  FINDINGS: A comminuted, mildly displaced fracture is seen involving the base of middle phalanx with intra-articular extension into the PIP joint. No evidence of dislocation.  IMPRESSION: Comminuted fracture of base of middle phalanx, with intra-articular extension.   Electronically Signed   By: Myles Rosenthal M.D.   On: 08/07/2013 19:20    Microbiology: No results found for this or any previous visit (from the past 240 hour(s)).   Labs: Basic Metabolic Panel:  Recent Labs Lab 08/22/13 0400 08/23/13 0615 08/25/13 0435 08/26/13 0500  NA 136 135 135 133*  K 4.7 4.8 5.0 4.5  CL 102 101 102 99  CO2 23 24 25 24   GLUCOSE 105* 99 90 88  BUN 12 15 22 22   CREATININE 0.88 0.96 0.95 0.96  CALCIUM 9.4 9.3 9.3 9.3   Liver Function Tests:  Recent Labs Lab 08/23/13 0615  AST 19  ALT 16  ALKPHOS 186*  BILITOT 1.7*  PROT 7.2   ALBUMIN 3.1*   No results found for this basename: LIPASE, AMYLASE,  in the last 168 hours No results found for this basename: AMMONIA,  in the last 168 hours CBC:  Recent Labs Lab 08/23/13 0615 08/24/13 0500 08/25/13 0435 08/26/13 0500 08/27/13 1425 08/28/13 0950  WBC 16.9*  --  14.3* 14.3* 12.2* 13.8*  HGB 4.2* 6.4* 6.5* 6.4* 6.0* 5.8*  HCT 12.5* 19.1* 20.5* 19.3* 18.6* 17.4*  MCV 98.4  --  96.2 94.6 93.5 92.6  PLT 591*  --  716* 659* 602* 644*   Cardiac Enzymes: No results found for this basename: CKTOTAL, CKMB, CKMBINDEX, TROPONINI,  in the last 168 hours BNP: BNP (last 3 results) No results found for this basename: PROBNP,  in the last 8760 hours CBG: No results found for this basename: GLUCAP,  in the last 168 hours     Signed:  Ricarda Atayde C  [facility] 08/28/2013, 12:20 PM

## 2013-08-28 NOTE — Progress Notes (Signed)
Patient discharged home discharge instructions given and explained to patient and he verbalized understanding, denies any pain/distress. No wound noted, PICC removal site dressing clean dry and intact.

## 2013-08-28 NOTE — Progress Notes (Signed)
Slowly improving. Hemoglobin overall stable since last transfusion. No laboratory evidence for ongoing sickle crisis or a delayed transfusion reaction. Physical exam stable: pharynx no erythema or exudate, lungs clear, regular cardiac rhythm, abdomen soft, extremities no edema no calf tenderness Impression:  #1 sickle pain crisis Slowly subsiding. I would taper him off his IV morphine and consider discharge soon. I encouraged him to attempt the transition to outpatient status. #2. 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 Delayed transfusion reactions in the recent past. None during the current admission. Continue to use routine premedications prior to any transfusion and use the least incompatible units.

## 2013-09-04 ENCOUNTER — Emergency Department (HOSPITAL_COMMUNITY): Payer: Medicare Other

## 2013-09-04 ENCOUNTER — Inpatient Hospital Stay (HOSPITAL_COMMUNITY)
Admission: EM | Admit: 2013-09-04 | Discharge: 2013-09-15 | DRG: 812 | Disposition: A | Discharge status: Home / Self Care | Payer: Medicare Other | Attending: Internal Medicine | Admitting: Internal Medicine

## 2013-09-04 ENCOUNTER — Encounter (HOSPITAL_COMMUNITY): Payer: Self-pay | Admitting: Emergency Medicine

## 2013-09-04 DIAGNOSIS — D649 Anemia, unspecified: Secondary | ICD-10-CM

## 2013-09-04 DIAGNOSIS — R0602 Shortness of breath: Secondary | ICD-10-CM | POA: Diagnosis present

## 2013-09-04 DIAGNOSIS — D571 Sickle-cell disease without crisis: Secondary | ICD-10-CM

## 2013-09-04 DIAGNOSIS — F1120 Opioid dependence, uncomplicated: Secondary | ICD-10-CM

## 2013-09-04 DIAGNOSIS — R0789 Other chest pain: Secondary | ICD-10-CM | POA: Diagnosis present

## 2013-09-04 DIAGNOSIS — T80A0XD Non-ABO incompatibility reaction due to transfusion of blood or blood products, unspecified, subsequent encounter: Secondary | ICD-10-CM

## 2013-09-04 DIAGNOSIS — R509 Fever, unspecified: Secondary | ICD-10-CM | POA: Diagnosis present

## 2013-09-04 DIAGNOSIS — K5909 Other constipation: Secondary | ICD-10-CM

## 2013-09-04 DIAGNOSIS — R6510 Systemic inflammatory response syndrome (SIRS) of non-infectious origin without acute organ dysfunction: Secondary | ICD-10-CM

## 2013-09-04 DIAGNOSIS — Z832 Family history of diseases of the blood and blood-forming organs and certain disorders involving the immune mechanism: Secondary | ICD-10-CM

## 2013-09-04 DIAGNOSIS — Z96619 Presence of unspecified artificial shoulder joint: Secondary | ICD-10-CM

## 2013-09-04 DIAGNOSIS — G8929 Other chronic pain: Secondary | ICD-10-CM

## 2013-09-04 DIAGNOSIS — Z87898 Personal history of other specified conditions: Secondary | ICD-10-CM

## 2013-09-04 DIAGNOSIS — J209 Acute bronchitis, unspecified: Secondary | ICD-10-CM

## 2013-09-04 DIAGNOSIS — R0989 Other specified symptoms and signs involving the circulatory and respiratory systems: Secondary | ICD-10-CM

## 2013-09-04 DIAGNOSIS — IMO0001 Reserved for inherently not codable concepts without codable children: Secondary | ICD-10-CM

## 2013-09-04 DIAGNOSIS — S62605S Fracture of unspecified phalanx of left ring finger, sequela: Secondary | ICD-10-CM

## 2013-09-04 DIAGNOSIS — Z833 Family history of diabetes mellitus: Secondary | ICD-10-CM

## 2013-09-04 DIAGNOSIS — D5700 Hb-SS disease with crisis, unspecified: Principal | ICD-10-CM | POA: Diagnosis present

## 2013-09-04 DIAGNOSIS — R059 Cough, unspecified: Secondary | ICD-10-CM | POA: Diagnosis present

## 2013-09-04 DIAGNOSIS — R05 Cough: Secondary | ICD-10-CM | POA: Diagnosis present

## 2013-09-04 DIAGNOSIS — R110 Nausea: Secondary | ICD-10-CM | POA: Diagnosis present

## 2013-09-04 DIAGNOSIS — G894 Chronic pain syndrome: Secondary | ICD-10-CM | POA: Diagnosis present

## 2013-09-04 DIAGNOSIS — Z9114 Patient's other noncompliance with medication regimen: Secondary | ICD-10-CM

## 2013-09-04 DIAGNOSIS — S62605A Fracture of unspecified phalanx of left ring finger, initial encounter for closed fracture: Secondary | ICD-10-CM

## 2013-09-04 DIAGNOSIS — M549 Dorsalgia, unspecified: Secondary | ICD-10-CM

## 2013-09-04 DIAGNOSIS — Z87440 Personal history of urinary (tract) infections: Secondary | ICD-10-CM

## 2013-09-04 DIAGNOSIS — R2232 Localized swelling, mass and lump, left upper limb: Secondary | ICD-10-CM

## 2013-09-04 DIAGNOSIS — T80A0XS Non-ABO incompatibility reaction due to transfusion of blood or blood products, unspecified, sequela: Secondary | ICD-10-CM

## 2013-09-04 DIAGNOSIS — R5381 Other malaise: Secondary | ICD-10-CM

## 2013-09-04 LAB — CBC WITH DIFFERENTIAL/PLATELET
Band Neutrophils: 0 % (ref 0–10)
Basophils Absolute: 0.1 10*3/uL (ref 0.0–0.1)
Basophils Relative: 1 % (ref 0–1)
Eosinophils Absolute: 0.2 10*3/uL (ref 0.0–0.7)
Eosinophils Relative: 2 % (ref 0–5)
HCT: 17.8 % — ABNORMAL LOW (ref 39.0–52.0)
Hemoglobin: 5.9 g/dL — CL (ref 13.0–17.0)
MCH: 33 pg (ref 26.0–34.0)
MCV: 99.4 fL (ref 78.0–100.0)
Metamyelocytes Relative: 0 %
Monocytes Absolute: 0.3 10*3/uL (ref 0.1–1.0)
Monocytes Relative: 3 % (ref 3–12)
Neutrophils Relative %: 59 % (ref 43–77)
Platelets: 691 10*3/uL — ABNORMAL HIGH (ref 150–400)
Promyelocytes Absolute: 0 %
RBC: 1.79 MIL/uL — ABNORMAL LOW (ref 4.22–5.81)
WBC: 8.6 10*3/uL (ref 4.0–10.5)
nRBC: 126 /100 WBC — ABNORMAL HIGH

## 2013-09-04 LAB — BASIC METABOLIC PANEL
BUN: 19 mg/dL (ref 6–23)
Calcium: 9 mg/dL (ref 8.4–10.5)
Chloride: 107 mEq/L (ref 96–112)
Creatinine, Ser: 1.09 mg/dL (ref 0.50–1.35)
GFR calc Af Amer: >90 mL/min (ref >90)
Glucose, Bld: 91 mg/dL (ref 70–99)

## 2013-09-04 LAB — RETICULOCYTES: Retic Count, Absolute: 345.5 10*3/uL — ABNORMAL HIGH (ref 19.0–186.0)

## 2013-09-04 LAB — HEPATIC FUNCTION PANEL
ALT: 13 U/L (ref 0–53)
AST: 25 U/L (ref 0–37)
Albumin: 3.2 g/dL — ABNORMAL LOW (ref 3.5–5.2)
Bilirubin, Direct: 0.3 mg/dL (ref 0.0–0.3)

## 2013-09-04 MED ORDER — ONDANSETRON HCL 4 MG/2ML IJ SOLN: 4.0000 mg | INTRAMUSCULAR | Status: DC | PRN

## 2013-09-04 MED ORDER — SODIUM CHLORIDE 0.9 % IV SOLN
25.0000 mg | INTRAVENOUS | Status: DC | PRN
  Administered 2013-09-15: 25 mg via INTRAVENOUS
  Filled 2013-09-04 (×2): qty 0.5

## 2013-09-04 MED ORDER — NALOXONE HCL 0.4 MG/ML IJ SOLN: 0.4000 mg | INTRAMUSCULAR | Status: DC | PRN

## 2013-09-04 MED ORDER — DIPHENHYDRAMINE HCL 25 MG PO CAPS
25.0000 mg | ORAL_CAPSULE | Freq: Once | ORAL | Status: AC
  Administered 2013-09-04: 25 mg via ORAL
  Filled 2013-09-04: qty 1

## 2013-09-04 MED ORDER — HYDROMORPHONE HCL PF 2 MG/ML IJ SOLN
2.0000 mg | Freq: Once | INTRAMUSCULAR | Status: AC
  Administered 2013-09-04: 2 mg via INTRAVENOUS
  Filled 2013-09-04: qty 1

## 2013-09-04 MED ORDER — SODIUM CHLORIDE 0.45 % IV SOLN
INTRAVENOUS | Status: DC
  Administered 2013-09-04 – 2013-09-14 (×22): via INTRAVENOUS

## 2013-09-04 MED ORDER — ONDANSETRON HCL 4 MG PO TABS: 4.0000 mg | ORAL_TABLET | ORAL | Status: DC | PRN

## 2013-09-04 MED ORDER — HYDROMORPHONE 0.3 MG/ML IV SOLN
INTRAVENOUS | Status: DC
  Administered 2013-09-04: 18:00:00 via INTRAVENOUS
  Administered 2013-09-04: 6.94 mg via INTRAVENOUS
  Administered 2013-09-04 – 2013-09-05 (×2): via INTRAVENOUS
  Administered 2013-09-05: 4.31 mg via INTRAVENOUS
  Administered 2013-09-05: 3.3 mg via INTRAVENOUS
  Administered 2013-09-05: 6 mg via INTRAVENOUS
  Administered 2013-09-05: 4.5 mg via INTRAVENOUS
  Administered 2013-09-05: 23:00:00 1.79 mg via INTRAVENOUS
  Administered 2013-09-05: 3.19 mg via INTRAVENOUS
  Administered 2013-09-06: 1.2 mg via INTRAVENOUS
  Administered 2013-09-06: 06:00:00 2.7 mg via INTRAVENOUS
  Administered 2013-09-06: 21:00:00 via INTRAVENOUS
  Administered 2013-09-06 (×2): 4.8 mg via INTRAVENOUS
  Administered 2013-09-06: 3.8 mg via INTRAVENOUS
  Administered 2013-09-06: 4.31 mg via INTRAVENOUS
  Administered 2013-09-06 – 2013-09-07 (×2): via INTRAVENOUS
  Administered 2013-09-07: 3.3 mg via INTRAVENOUS
  Administered 2013-09-07: 3.9 mg via INTRAVENOUS
  Administered 2013-09-07 (×2): via INTRAVENOUS
  Administered 2013-09-07: 3.6 mg via INTRAVENOUS
  Administered 2013-09-07: 4.5 mg via INTRAVENOUS
  Administered 2013-09-07: 4.48 mg via INTRAVENOUS
  Administered 2013-09-07: 4.5 mg via INTRAVENOUS
  Administered 2013-09-08: 2.7 mg via INTRAVENOUS
  Administered 2013-09-08: 2.1 mg via INTRAVENOUS
  Administered 2013-09-08: 6.9 mg via INTRAVENOUS
  Administered 2013-09-08: 4.44 mg via INTRAVENOUS
  Administered 2013-09-08 (×3): via INTRAVENOUS
  Administered 2013-09-08: 3 mg via INTRAVENOUS
  Administered 2013-09-08: 5.4 mg via INTRAVENOUS
  Administered 2013-09-08: 3.3 mg via INTRAVENOUS
  Administered 2013-09-08: 01:00:00 via INTRAVENOUS
  Administered 2013-09-09: 7.5 mg via INTRAVENOUS
  Administered 2013-09-09 (×2): via INTRAVENOUS
  Administered 2013-09-09: 2.7 mg via INTRAVENOUS
  Administered 2013-09-09: 4.2 mg via INTRAVENOUS
  Administered 2013-09-09: 1.32 mg via INTRAVENOUS
  Administered 2013-09-09: 2.7 mg via INTRAVENOUS
  Administered 2013-09-10: 25 mg via INTRAVENOUS
  Administered 2013-09-10: 3.3 mg via INTRAVENOUS
  Administered 2013-09-10: 6.9 mg via INTRAVENOUS
  Administered 2013-09-10: 4.2 mg via INTRAVENOUS
  Administered 2013-09-10: 4.5 mg via INTRAVENOUS
  Administered 2013-09-10: 10:00:00 via INTRAVENOUS
  Administered 2013-09-10 (×2): 2.4 mg via INTRAVENOUS
  Administered 2013-09-11: 3.8 mg via INTRAVENOUS
  Administered 2013-09-11: 4.62 mg via INTRAVENOUS
  Administered 2013-09-11: 3.3 mg via INTRAVENOUS
  Administered 2013-09-11 (×2): via INTRAVENOUS
  Administered 2013-09-11: 4.5 mg via INTRAVENOUS
  Administered 2013-09-11: 15:00:00 via INTRAVENOUS
  Administered 2013-09-11: 2.4 mg via INTRAVENOUS
  Administered 2013-09-11: 3.9 mg via INTRAVENOUS
  Administered 2013-09-12: 01:00:00 via INTRAVENOUS
  Administered 2013-09-12: 2.1 mg via INTRAVENOUS
  Administered 2013-09-12: 4.5 mg via INTRAVENOUS
  Administered 2013-09-12: 4.6 mg via INTRAVENOUS
  Administered 2013-09-12: 7.8 mg via INTRAVENOUS
  Administered 2013-09-12: 15:00:00 via INTRAVENOUS
  Administered 2013-09-12: 21:00:00 3.5 mg via INTRAVENOUS
  Administered 2013-09-12: 2.7 mg via INTRAVENOUS
  Administered 2013-09-13: 2.4 mg via INTRAVENOUS
  Administered 2013-09-13: 20:00:00 via INTRAVENOUS
  Administered 2013-09-13: 2.4 mg via INTRAVENOUS
  Administered 2013-09-13: 4.5 mg via INTRAVENOUS
  Administered 2013-09-13: 3.3 mg via INTRAVENOUS
  Administered 2013-09-13: 12:00:00 via INTRAVENOUS
  Administered 2013-09-13: 5.4 mg via INTRAVENOUS
  Administered 2013-09-13: 4.2 mg via INTRAVENOUS
  Administered 2013-09-14: 0.6 mg via INTRAVENOUS
  Administered 2013-09-14: 4.8 mg via INTRAVENOUS
  Administered 2013-09-14 (×2): via INTRAVENOUS
  Administered 2013-09-14: 4.2 mg via INTRAVENOUS
  Administered 2013-09-14: 2.7 mg via INTRAVENOUS
  Administered 2013-09-14: 19:00:00 via INTRAVENOUS
  Administered 2013-09-14: 3.3 mg via INTRAVENOUS
  Administered 2013-09-15: 3.6 mg via INTRAVENOUS
  Administered 2013-09-15: 02:00:00 via INTRAVENOUS
  Administered 2013-09-15: 4.8 mg via INTRAVENOUS
  Administered 2013-09-15: 10:00:00 via INTRAVENOUS
  Administered 2013-09-15: 3.3 mg via INTRAVENOUS
  Filled 2013-09-04 (×36): qty 25

## 2013-09-04 MED ORDER — SENNOSIDES-DOCUSATE SODIUM 8.6-50 MG PO TABS
1.0000 | ORAL_TABLET | Freq: Two times a day (BID) | ORAL | Status: DC
  Administered 2013-09-04 – 2013-09-15 (×21): 1 via ORAL
  Filled 2013-09-04 (×26): qty 1

## 2013-09-04 MED ORDER — ACETAMINOPHEN 650 MG RE SUPP: 650.0000 mg | RECTAL | Status: DC | PRN

## 2013-09-04 MED ORDER — PANTOPRAZOLE SODIUM 40 MG PO TBEC
40.0000 mg | DELAYED_RELEASE_TABLET | Freq: Every day | ORAL | Status: DC
  Administered 2013-09-04 – 2013-09-15 (×12): 40 mg via ORAL
  Filled 2013-09-04 (×12): qty 1

## 2013-09-04 MED ORDER — FOLIC ACID 1 MG PO TABS
1.0000 mg | ORAL_TABLET | Freq: Every morning | ORAL | Status: DC
  Administered 2013-09-05 – 2013-09-15 (×11): 1 mg via ORAL
  Filled 2013-09-04 (×11): qty 1

## 2013-09-04 MED ORDER — POLYETHYLENE GLYCOL 3350 17 G PO PACK
17.0000 g | PACK | Freq: Two times a day (BID) | ORAL | Status: DC
  Administered 2013-09-04 – 2013-09-14 (×19): 17 g via ORAL
  Filled 2013-09-04 (×23): qty 1

## 2013-09-04 MED ORDER — SODIUM CHLORIDE 0.9 % IJ SOLN
9.0000 mL | INTRAMUSCULAR | Status: DC | PRN
  Administered 2013-09-05: 05:00:00 9 mL via INTRAVENOUS

## 2013-09-04 MED ORDER — MORPHINE SULFATE ER 30 MG PO TBCR
60.0000 mg | EXTENDED_RELEASE_TABLET | Freq: Two times a day (BID) | ORAL | Status: DC
  Administered 2013-09-04 – 2013-09-15 (×22): 60 mg via ORAL
  Filled 2013-09-04 (×22): qty 2

## 2013-09-04 MED ORDER — DIPHENHYDRAMINE HCL 25 MG PO CAPS
25.0000 mg | ORAL_CAPSULE | ORAL | Status: DC | PRN
  Administered 2013-09-14: 20:00:00 50 mg via ORAL
  Filled 2013-09-04: qty 2

## 2013-09-04 MED ORDER — ENOXAPARIN SODIUM 40 MG/0.4ML ~~LOC~~ SOLN
40.0000 mg | SUBCUTANEOUS | Status: DC
  Administered 2013-09-04 – 2013-09-14 (×11): 40 mg via SUBCUTANEOUS
  Filled 2013-09-04 (×12): qty 0.4

## 2013-09-04 MED ORDER — KETOROLAC TROMETHAMINE 30 MG/ML IJ SOLN
30.0000 mg | Freq: Once | INTRAMUSCULAR | Status: AC
  Administered 2013-09-04: 30 mg via INTRAVENOUS
  Filled 2013-09-04: qty 1

## 2013-09-04 MED ORDER — KETOROLAC TROMETHAMINE 30 MG/ML IJ SOLN
30.0000 mg | Freq: Four times a day (QID) | INTRAMUSCULAR | Status: AC | PRN
  Administered 2013-09-04 – 2013-09-05 (×2): 30 mg via INTRAVENOUS
  Filled 2013-09-04 (×2): qty 1

## 2013-09-04 MED ORDER — ACETAMINOPHEN 325 MG PO TABS: 650.0000 mg | ORAL_TABLET | ORAL | Status: DC | PRN

## 2013-09-04 NOTE — ED Notes (Addendum)
Patient also c/o SOB x 2-3 days. Patient c/o chest tightness x 3 days.

## 2013-09-04 NOTE — H&P (Signed)
[facility] History and Physical  Dennis Bernard ZOX:[id number] DOB: April 03, 1986 DOA: 09/04/2013   PCP: Is a Dr. Meredeth Ide in [facility]. He doesn't have a PCP here. Specialists: He states that Dr. Cyndie Chime is his hematologist, although he's never seen him in his office.  Chief Complaint: Pain in the back and right leg along with chest tightness  HPI: Dennis Bernard is a 27 y.o. male with a past medical history of sickle cell disease, who was recently hospitalized for sickle cell pain crises. He received blood transfusions during that hospitalization. Apparently patient has multiple antibodies and should only be transfused rarely. Patient was doing well after discharge and then about 3 days ago he started developing pain in the back and the right upper leg. He also has a shortness of breath and fever up to 100.7 yesterday. He was concerned about delayed transfusion reaction and so, he decided to come in to the hospital. He took Tylenol for his fever and the fever resolved. He's had a dry cough and complaints of chest tightness, which he has had before with sickle cell crisis, and was concerned about acute chest syndrome. He received a flu vaccine this season. Denies any travel anywhere. The pain in his back and his right leg is 10 out of 10 in intensity. Sharp pain without any radiation. No precipitating factor apart from the sickle cell. Pain is aggravated with movement. No nausea, vomiting. No diarrhea. No urinary complaints. He was recently taken off of his hydroxyurea due to frequent blood transfusions.  Home Medications: Prior to Admission medications   Medication Sig Start Date End Date Taking? Authorizing Provider  albuterol (PROVENTIL HFA;VENTOLIN HFA) 108 (90 BASE) MCG/ACT inhaler Inhale 1 puff into the lungs every 6 (six) hours as needed for wheezing or shortness of breath. wheezing 11/27/12  Yes Vassie Loll, MD  diphenhydrAMINE (BENADRYL) 25 MG tablet Take 1 tablet (25 mg total)  by mouth every 8 (eight) hours as needed for itching. 07/29/13  Yes Alison Murray, MD  fluticasone [facility]) 50 MCG/ACT nasal spray Place 1 spray into the nose daily. 05/19/13  Yes Kathlen Mody, MD  folic acid (FOLVITE) 1 MG tablet Take 1 mg by mouth every morning. 11/27/12  Yes Vassie Loll, MD  HYDROmorphone (DILAUDID) 4 MG tablet Take 1 tablet (4 mg total) by mouth every 4 (four) hours as needed. 08/28/13  Yes Adeline Joselyn Glassman, MD  morphine (MS CONTIN) 60 MG 12 hr tablet Take 1 tablet (60 mg total) by mouth 2 (two) times daily. 08/28/13  Yes Adeline Joselyn Glassman, MD  naproxen (NAPROSYN) 500 MG tablet Take 1 tablet (500 mg total) by mouth 2 (two) times daily with a meal. 08/28/13  Yes Adeline C Viyuoh, MD  omeprazole (PRILOSEC) 40 MG capsule Take 1 capsule (40 mg total) by mouth daily. 08/28/13  Yes Adeline Joselyn Glassman, MD  polyethylene glycol (MIRALAX / GLYCOLAX) packet Take 17 g by mouth 2 (two) times daily. 08/28/13  Yes Adeline Joselyn Glassman, MD  promethazine (PHENERGAN) 25 MG tablet Take 1 tablet (25 mg total) by mouth every 6 (six) hours as needed for nausea. 07/29/13  Yes Alison Murray, MD  sennosides-docusate sodium (SENOKOT-S) 8.6-50 MG tablet Take 1 tablet by mouth 2 (two) times daily. 02/14/13  Yes Altha Harm, MD    Allergies: No Known Allergies  Past Medical History: Past Medical History  Diagnosis Date  . Sickle cell disease   . Avascular necrosis of femur head, right   . H/O allergic rhinitis   .  H/O hypokalemia   . Chronic pain syndrome   . H/O wheezing     with colds  . 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 Cholecystectomy  1995  . Portacath placement Left 07/04/2012  . Exchange transfusion  02/2008    perioperatively for shoulder surgery  . 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 Portacath placement Right November 2014    Social History: Patient lives with his family. He is going to school here  locally. No smoking, alcohol or illicit drug use. Independent with daily activities.  Family History:  Family History  Problem Relation Age of Onset  . Sickle cell anemia Brother   . Sickle cell trait Father   . Sickle cell trait Mother   . Diabetes Mellitus II Mother   . Sickle cell anemia Paternal Uncle   . Asthma Neg Hx   . Allergic rhinitis Neg Hx      Review of Systems - History obtained from the patient General ROS: positive for  - fatigue and fever Psychological ROS: negative Ophthalmic ROS: negative ENT ROS: negative Allergy and Immunology ROS: negative Hematological and Lymphatic ROS: negative Endocrine ROS: negative Respiratory ROS: as in hpi Cardiovascular ROS: as in hpi Gastrointestinal ROS: no abdominal pain, change in bowel habits, or black or bloody stools Genito-Urinary ROS: no dysuria, trouble voiding, or hematuria Musculoskeletal ROS: as in hpi Neurological ROS: no TIA or stroke symptoms Dermatological ROS: negative  Physical Examination  Filed Vitals:   09/04/13 1300 09/04/13 1330 09/04/13 1400 09/04/13 1530  BP: 110/66 110/66 107/71 91/57  Pulse: 70 82 78 77  Temp:      TempSrc:      Resp: 14 17 23 15   SpO2: 98% 98% 96% 96%    General appearance: alert, cooperative, appears stated age and no distress Head: Normocephalic, without obvious abnormality, atraumatic Eyes: conjunctivae/corneas clear. PERRL, EOM's intact.  Throat: lips, mucosa, and tongue normal; teeth and gums normal Back: symmetric, no curvature. ROM normal. No CVA tenderness. Resp: clear to auscultation bilaterally Cardio: regular rate and rhythm, S1, S2 normal, systolic murmur over precordium. No click, rub or gallop GI: soft, non-tender; bowel sounds normal; no masses,  no organomegaly Extremities: extremities normal, atraumatic, no cyanosis or edema Pulses: 2+ and symmetric Skin: Skin color, texture, turgor normal. No rashes or lesions Lymph nodes: Cervical, supraclavicular, and  axillary nodes normal. Neurologic: No focal deficits.  Laboratory Data: Results for orders placed during the hospital encounter of 09/04/13 (from the past 48 hour(s))  CBC WITH DIFFERENTIAL     Status: Abnormal   Collection Time    09/04/13 11:55 AM      Result Value Range   WBC 8.6  4.0 - 10.5 K/uL   Comment: WHITE COUNT CONFIRMED ON SMEAR     ADJUSTED FOR NUCLEATED RBC'S   RBC 1.79 (*) 4.22 - 5.81 MIL/uL   Hemoglobin 5.9 (*) 13.0 - 17.0 g/dL   Comment: REPEATED TO VERIFY     CRITICAL RESULT CALLED TO, READ BACK BY AND VERIFIED WITH:     BINGHAM,S AT 1312 ON 122214 BY POTEAT,S   HCT 17.8 (*) 39.0 - 52.0 %   MCV 99.4  78.0 - 100.0 fL   MCH 33.0  26.0 - 34.0 pg   MCHC 33.1  30.0 - 36.0 g/dL   RDW 16.1 (*) 09.6 - 04.5 %   Platelets 691 (*) 150 - 400 K/uL   Comment: REPEATED TO VERIFY  SPECIMEN CHECKED FOR CLOTS     PLATELET COUNT CONFIRMED BY SMEAR   Neutrophils Relative % 59  43 - 77 %   Lymphocytes Relative 35  12 - 46 %   Monocytes Relative 3  3 - 12 %   Eosinophils Relative 2  0 - 5 %   Basophils Relative 1  0 - 1 %   Band Neutrophils 0  0 - 10 %   Metamyelocytes Relative 0     Myelocytes 0     Promyelocytes Absolute 0     Blasts 0     nRBC 126 (*) 0 /100 WBC   Neutro Abs 5.0  1.7 - 7.7 K/uL   Lymphs Abs 3.0  0.7 - 4.0 K/uL   Monocytes Absolute 0.3  0.1 - 1.0 K/uL   Eosinophils Absolute 0.2  0.0 - 0.7 K/uL   Basophils Absolute 0.1  0.0 - 0.1 K/uL   RBC Morphology MARKED POLYCHROMASIA     Comment: TARGET CELLS     SICKLE CELLS     STOMATOCYTES     ELLIPTOCYTES   WBC Morphology WHITE COUNT CONFIRMED ON SMEAR     Smear Review PLATELET COUNT CONFIRMED BY SMEAR    BASIC METABOLIC PANEL     Status: None   Collection Time    09/04/13 11:55 AM      Result Value Range   Sodium 139  135 - 145 mEq/L   Potassium 4.4  3.5 - 5.1 mEq/L   Chloride 107  96 - 112 mEq/L   CO2 22  19 - 32 mEq/L   Glucose, Bld 91  70 - 99 mg/dL   BUN 19  6 - 23 mg/dL   Creatinine, Ser 4.40   0.50 - 1.35 mg/dL   Calcium 9.0  8.4 - 34.7 mg/dL   GFR calc non Af Amer >90  >90 mL/min   GFR calc Af Amer >90  >90 mL/min   Comment: (NOTE)     The eGFR has been calculated using the CKD EPI equation.     This calculation has not been validated in all clinical situations.     eGFR's persistently <90 mL/min signify possible Chronic Kidney     Disease.  RETICULOCYTES     Status: Abnormal   Collection Time    09/04/13 11:55 AM      Result Value Range   Retic Ct Pct 19.3 (*) 0.4 - 3.1 %   RBC. 1.79 (*) 4.22 - 5.81 MIL/uL   Retic Count, Manual 345.5 (*) 19.0 - 186.0 K/uL    Radiology Reports: Dg Chest 2 View  09/04/2013   CLINICAL DATA:  Fever, sickle cell crisis.  EXAM: CHEST  2 VIEW  COMPARISON:  August 20, 2013.  FINDINGS: Interval placement of right-sided Port-A-Cath with distal tip in expected position of the SVC. Cardiomediastinal silhouette appears normal. Left shoulder arthroplasty is noted. No pneumothorax or pleural effusion is noted. Stable interstitial densities are noted throughout both lungs. No acute pulmonary disease is noted.  IMPRESSION: Stable interstitial densities compared to prior exam. No acute cardiopulmonary abnormality seen.   Electronically Signed   By: Roque Lias M.D.   On: 09/04/2013 11:26    Electrocardiogram: Sinus rhythm at 94 beats per minute. Normal axis. Intervals are normal. T inversion in lead 3, which is old. No acute ST changes noted. Compared to EKG from November.  Problem List  Active Problems:   Sickle cell pain crisis   Sickle cell anemia  Chronic pain   Fracture of phalanx of left ring finger   Sickle cell crisis   Chest tightness   Assessment: This is a 27 year old, African American male, with sickle cell disease, presents with back pain, leg pain, chest tightness. He has elevated reticulocyte count. He appears to have sickle cell crises and pain. Chest x-ray is unremarkable. He's not hypoxic and unlikely to have acute chest  syndrome.  Plan: #1 sickle cell pain crises: He'll be admitted to the hospital. He will be placed on a Dilaudid PCA. He'll be given IV fluids. EKG shows chronic changes. Dr. Cyndie Chime will be flagged in Epic. According to previous notes he should be transfused only if hemoglobin drops below 5. Whenever he needs to be transfused he should be premedicated with Solu-Medrol 40 mg IV, Tylenol, 650 mg by mouth, Benadryl, 50 mg by mouth. LFT will be checked.  #2 chest tightness: Most likely secondary to sickle cell crises. EKG is nonischemic. Chest x-ray does not show any acute findings. Continue to monitor closely. Very low suspicion for thromboembolic event.  #3 fracture of the phalanx of left ring finger: He is followed by Dr. Amanda Pea.    DVT Prophylaxis: Lovenox Code Status: Full code Family Communication: Discussed with the patient  Disposition Plan: Admit to telemetry   Further management decisions will depend on results of further testing and patient's response to treatment.  [facility]  [facility] Pager [phone]  If 7PM-7AM, please contact night-coverage [email or web address] Password [facility]  09/04/2013, 4:35 PM

## 2013-09-04 NOTE — ED Provider Notes (Signed)
CSN: [id number]     Arrival date & time 09/04/13  0940 History   First MD Initiated Contact with Patient 09/04/13 1044     Chief Complaint  Patient presents with  . Sickle Cell Pain Crisis   (Consider location/radiation/quality/duration/timing/severity/associated sxs/prior Treatment) HPI  This is a 27 year old male with history sickle cell disease who presents with crisis.  Patient had a recent prolonged hospital stay complicated by UTI, her current anemia with transfusions, and difficult to manage pain. Patient was discharged on December 15. Patient reports a 2 to three-day history of back and right lower extremity pain. Patient states that this is consistent with his prior sickle cell crises. He endorses chest pain as well as shortness of breath and low-grade fevers at home with his last fever of 100.3 on Saturday. Patient states he has delayed reactions to blood transfusions and is afraid that this is what is happening.  Past Medical History  Diagnosis Date  . Sickle cell disease   . Avascular necrosis of femur head, right   . H/O allergic rhinitis   . H/O hypokalemia   . Chronic pain syndrome   . H/O wheezing     with colds  . 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 Cholecystectomy  1995  . Portacath placement Left 07/04/2012  . Exchange transfusion  02/2008    perioperatively for shoulder surgery  . 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 Portacath placement Right November 2014   Family History  Problem Relation Age of Onset  . Sickle cell anemia Brother   . Sickle cell trait Father   . Sickle cell trait Mother   . Diabetes Mellitus II Mother   . Sickle cell anemia Paternal Uncle   . Asthma Neg Hx   . Allergic rhinitis Neg Hx    History  Substance Use Topics  . Smoking status: Never Smoker   . Smokeless tobacco: Never Used  . Alcohol Use: No    Review of Systems  Constitutional: Positive for  fever.  Respiratory: Positive for chest tightness and shortness of breath.   Cardiovascular: Negative.  Negative for chest pain.  Gastrointestinal: Negative.  Negative for nausea, vomiting and abdominal pain.  Genitourinary: Negative.  Negative for dysuria.  Musculoskeletal: Positive for back pain.  Skin: Negative for rash.  Neurological: Negative for headaches.  All other systems reviewed and are negative.    Allergies  Review of patient's allergies indicates no known allergies.  Home Medications   Current Outpatient Rx  Name  Route  Sig  Dispense  Refill  . albuterol (PROVENTIL HFA;VENTOLIN HFA) 108 (90 BASE) MCG/ACT inhaler   Inhalation   Inhale 1 puff into the lungs every 6 (six) hours as needed for wheezing or shortness of breath. wheezing   1 Inhaler   1   . diphenhydrAMINE (BENADRYL) 25 MG tablet   Oral   Take 1 tablet (25 mg total) by mouth every 8 (eight) hours as needed for itching.   60 tablet   0   . fluticasone (FLONASE) 50 MCG/ACT nasal spray   Nasal   Place 1 spray into the nose daily.   16 g   0   . folic acid (FOLVITE) 1 MG tablet   Oral   Take 1 mg by mouth every morning.         Marland Kitchen HYDROmorphone (DILAUDID) 4 MG tablet   Oral   Take 1 tablet (4 mg total) by  mouth every 4 (four) hours as needed.   45 tablet   0   . morphine (MS CONTIN) 60 MG 12 hr tablet   Oral   Take 1 tablet (60 mg total) by mouth 2 (two) times daily.   30 tablet   0   . naproxen (NAPROSYN) 500 MG tablet   Oral   Take 1 tablet (500 mg total) by mouth 2 (two) times daily with a meal.   60 tablet   0   . omeprazole (PRILOSEC) 40 MG capsule   Oral   Take 1 capsule (40 mg total) by mouth daily.   30 capsule   0   . polyethylene glycol (MIRALAX / GLYCOLAX) packet   Oral   Take 17 g by mouth 2 (two) times daily.   30 each   0   . promethazine (PHENERGAN) 25 MG tablet   Oral   Take 1 tablet (25 mg total) by mouth every 6 (six) hours as needed for nausea.   60  tablet   0   . sennosides-docusate sodium (SENOKOT-S) 8.6-50 MG tablet   Oral   Take 1 tablet by mouth 2 (two) times daily.          BP 91/57  Pulse 77  Temp(Src) 99 F (37.2 C) (Oral)  Resp 15  SpO2 96% Physical Exam  Nursing note and vitals reviewed. Constitutional: He is oriented to person, place, and time.  Ill-appearing, no acute distress  HENT:  Head: Normocephalic and atraumatic.  Eyes: Pupils are equal, round, and reactive to light.  Neck: Neck supple.  Cardiovascular: Regular rhythm and normal heart sounds.   No murmur heard. Tachycardia  Pulmonary/Chest: Effort normal and breath sounds normal. No respiratory distress. He has no wheezes.  Abdominal: Soft. Bowel sounds are normal. There is no tenderness. There is no rebound.  Musculoskeletal: He exhibits no edema.  Lymphadenopathy:    He has no cervical adenopathy.  Neurological: He is alert and oriented to person, place, and time.  Skin: Skin is warm and dry.  Psychiatric: He has a normal mood and affect.    ED Course  Procedures (including critical care time) Labs Review Labs Reviewed  CBC WITH DIFFERENTIAL - Abnormal; Notable for the following:    RBC 1.79 (*)    Hemoglobin 5.9 (*)    HCT 17.8 (*)    RDW 22.1 (*)    Platelets 691 (*)    nRBC 126 (*)    All other components within normal limits  RETICULOCYTES - Abnormal; Notable for the following:    Retic Ct Pct 19.3 (*)    RBC. 1.79 (*)    Retic Count, Manual 345.5 (*)    All other components within normal limits  BASIC METABOLIC PANEL   Imaging Review Dg Chest 2 View  09/04/2013   CLINICAL DATA:  Fever, sickle cell crisis.  EXAM: CHEST  2 VIEW  COMPARISON:  August 20, 2013.  FINDINGS: Interval placement of right-sided Port-A-Cath with distal tip in expected position of the SVC. Cardiomediastinal silhouette appears normal. Left shoulder arthroplasty is noted. No pneumothorax or pleural effusion is noted. Stable interstitial densities are noted  throughout both lungs. No acute pulmonary disease is noted.  IMPRESSION: Stable interstitial densities compared to prior exam. No acute cardiopulmonary abnormality seen.   Electronically Signed   By: Roque Lias M.D.   On: 09/04/2013 11:26    EKG Interpretation    Date/Time:  Monday September 04 2013 10:37:30 EST Ventricular Rate:  94 PR Interval:  143 QRS Duration: 81 QT Interval:  348 QTC Calculation: 435 R Axis:   52 Text Interpretation:  Normal sinus rhythm No significant change since last tracing Confirmed by Kataleena Holsapple  MD, Trentyn Boisclair ([id number]) on 09/04/2013 4:03:21 PM            MDM   1. Sickle cell crisis   2. Anemia    Patient history sickle cell anemia who presents with back and leg pain consistent with his prior crises. Patient also reports low grade fevers at home, shortness of breath, and chest pain. He is satting 100% on room air and lung exam is clear. Basic labwork was obtained including CBC and reticulocyte count. EKG is reassuring. Chest x-ray is stable from patient's prior discharge. Patient was given Toradol as well as 2 doses of 2 mg of Dilaudid without improvement of his pain. Work up is notable for a hemoglobin of 5.9 which is stable from discharge. Patient's reticulocyte count is 19.3. I reviewed the patient's chart. Per the patient's primary hematologist, patient should only be transfused for hemoglobin less than 5.  Given patient's continued pain, patient will be admitted to the hospitalist service.   Shon Baton, MD 09/04/13 [phone]

## 2013-09-04 NOTE — ED Notes (Signed)
Pt c/o Sickle Cell Pain in back and upper R leg and intermittent fever x 3 days.  Pain score 10/10.  Denies chest pain.

## 2013-09-04 NOTE — ED Notes (Signed)
Attempted to call report, but receiving nurse unavailable . Receiving nurse to return call

## 2013-09-05 LAB — COMPREHENSIVE METABOLIC PANEL
ALT: 12 U/L (ref 0–53)
AST: 24 U/L (ref 0–37)
Alkaline Phosphatase: 105 U/L (ref 39–117)
CO2: 22 mEq/L (ref 19–32)
Chloride: 104 mEq/L (ref 96–112)
GFR calc Af Amer: >90 mL/min (ref >90)
GFR calc non Af Amer: >90 mL/min (ref >90)
Potassium: 4 mEq/L (ref 3.5–5.1)
Sodium: 136 mEq/L (ref 135–145)
Total Bilirubin: 1 mg/dL (ref 0.3–1.2)

## 2013-09-05 LAB — CBC WITH DIFFERENTIAL/PLATELET
Basophils Absolute: 0 10*3/uL (ref 0.0–0.1)
Eosinophils Relative: 8 % — ABNORMAL HIGH (ref 0–5)
Lymphs Abs: 4.3 10*3/uL — ABNORMAL HIGH (ref 0.7–4.0)
MCH: 33.1 pg (ref 26.0–34.0)
MCV: 97.5 fL (ref 78.0–100.0)
Monocytes Relative: 18 % — ABNORMAL HIGH (ref 3–12)
Neutro Abs: 2.3 10*3/uL (ref 1.7–7.7)
Neutrophils Relative %: 26 % — ABNORMAL LOW (ref 43–77)
Platelets: 560 10*3/uL — ABNORMAL HIGH (ref 150–400)
RBC: 1.63 MIL/uL — ABNORMAL LOW (ref 4.22–5.81)
RDW: 20.6 % — ABNORMAL HIGH (ref 11.5–15.5)
nRBC: 59 /100 WBC — ABNORMAL HIGH

## 2013-09-05 LAB — TROPONIN I: Troponin I: <0.3 ng/mL (ref <0.30)

## 2013-09-05 LAB — RETICULOCYTES
RBC.: 1.63 MIL/uL — ABNORMAL LOW (ref 4.22–5.81)
Retic Count, Absolute: 340.7 10*3/uL — ABNORMAL HIGH (ref 19.0–186.0)
Retic Ct Pct: 20.9 % — ABNORMAL HIGH (ref 0.4–3.1)

## 2013-09-05 NOTE — Progress Notes (Signed)
Right after shift change, patient told the RN that his pain was still a 10 out of 10 and that the Dilaudid PCA was not enough. He wanted it to be increased. On call NP was notified and she did not want to increase the PCA, but she added toradol PRN to patient's medicines as well as let the RN give the 2200 dose of morphine early. Will continue to monitor patient.

## 2013-09-05 NOTE — Progress Notes (Addendum)
[facility] PROGRESS NOTE  Bunnie Lederman ZOX:[id number] DOB: [birth year] DOA: 09/04/2013  PCP: Is a Dr. Meredeth Ide in [facility]. He doesn't have a PCP here.  Brief HPI: Dennis Bernard is a 27 y.o. male with a past medical history of sickle cell disease, who was recently hospitalized for sickle cell pain crises. He received blood transfusions during that hospitalization. He presented with worsening pain over last 3 days and fever. He was admitted for sickle cell crisis.  Past medical history:  Past Medical History  Diagnosis Date  . Sickle cell disease   . Avascular necrosis of femur head, right   . H/O allergic rhinitis   . H/O hypokalemia   . Chronic pain syndrome   . H/O wheezing     with colds  . 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 No new complaints. Still with back ache, right leg pain and occ chest pain.   Objective: Vital Signs  Filed Vitals:   09/05/13 0607 09/05/13 0807 09/05/13 1106 09/05/13 1108  BP: 104/64     Pulse: 65     Temp: 98.2 F (36.8 C)     TempSrc: Oral     Resp: 12 12 11 11   Height:      Weight:      SpO2: 100% 100%  96%    Intake/Output Summary (Last 24 hours) at 09/05/13 1345 Last data filed at 09/05/13 8119  Gross per 24 hour  Intake 2188.33 ml  Output   1700 ml  Net 488.33 ml   Filed Weights   09/04/13 1729  Weight: 76.93 kg (169 lb 9.6 oz)   General appearance: alert, cooperative, appears stated age and no distress Head: Normocephalic, without obvious abnormality, atraumatic Resp: clear to auscultation bilaterally Cardio: regular rate and rhythm, S1, S2 normal, no murmur, click, rub or gallop GI: soft, non-tender; bowel sounds normal; no masses,  no organomegaly Extremities: extremities normal, atraumatic, no cyanosis or edema Neurologic: No focal deficits  Lab Results:  Basic Metabolic Panel:  Recent Labs Lab  09/04/13 1155 09/05/13 0455  NA 139 136  K 4.4 4.0  CL 107 104  CO2 22 22  GLUCOSE 91 80  BUN 19 20  CREATININE 1.09 0.95  CALCIUM 9.0 8.6  MG  --  2.2   Liver Function Tests:  Recent Labs Lab 09/04/13 1655 09/05/13 0455  AST 25 24  ALT 13 12  ALKPHOS 112 105  BILITOT 1.3* 1.0  PROT 6.8 6.3  ALBUMIN 3.2* 3.1*   CBC:  Recent Labs Lab 09/04/13 1155 09/05/13 0455  WBC 8.6 8.9  NEUTROABS 5.0 2.3  HGB 5.9* 5.4*  HCT 17.8* 15.9*  MCV 99.4 97.5  PLT 691* 560*   Cardiac Enzymes:  Recent Labs Lab 09/04/13 1655 09/04/13 2305 09/05/13 0455  TROPONINI <0.30 <0.30 <0.30    Studies/Results: Dg Chest 2 View  09/04/2013   CLINICAL DATA:  Fever, sickle cell crisis.  EXAM: CHEST  2 VIEW  COMPARISON:  August 20, 2013.  FINDINGS: Interval placement of right-sided Port-A-Cath with distal tip in expected position of the SVC. Cardiomediastinal silhouette appears normal. Left shoulder arthroplasty is noted. No pneumothorax or pleural effusion is noted. Stable interstitial densities are noted throughout both lungs. No acute pulmonary disease is noted.  IMPRESSION: Stable interstitial densities compared to prior exam. No acute cardiopulmonary abnormality seen.   Electronically Signed   By: Roque Lias M.D.   On:  09/04/2013 11:26    Medications:  Scheduled: . enoxaparin (LOVENOX) injection  40 mg Subcutaneous Q24H  . folic acid  1 mg Oral q morning - 10a  . HYDROmorphone PCA 0.3 mg/mL   Intravenous Q4H  . morphine  60 mg Oral BID  . pantoprazole  40 mg Oral Daily  . polyethylene glycol  17 g Oral BID  . senna-docusate  1 tablet Oral BID   Continuous: . sodium chloride 100 mL/hr at 09/05/13 1150   UJW:[id number], acetaminophen, diphenhydrAMINE (BENADRYL) IVPB(SICKLE CELL ONLY), diphenhydrAMINE, ketorolac, naloxone, ondansetron (ZOFRAN) IV, ondansetron, sodium chloride  Assessment/Plan:  Active Problems:   Sickle cell pain crisis   Sickle cell anemia   Chronic  pain   Fracture of phalanx of left ring finger   Sickle cell crisis   Chest tightness    Sickle cell Anemia with pain and crises Not much improvement since yesterday. Continue current management. Continue Dilaudid PCA and Toradol. Continue IV fluids. Dr. Cyndie Chime flagged in Epic. According to previous notes he should be transfused only if hemoglobin drops below 5. Whenever he needs to be transfused he should be premedicated with Solu-Medrol 40 mg IV, Tylenol, 650 mg by mouth, Benadryl, 50 mg by mouth. LFT's unremarkable. Hgb is above 5.  Chest tightness Most likely secondary to sickle cell crises. EKG is nonischemic. Chest x-ray does not show any acute findings. Continue to monitor closely. Very low suspicion for thromboembolic event.   Fracture of the phalanx of left ring finger Can follow with Dr. Amanda Pea as OP.  DVT Prophylaxis: Lovenox  Code Status: Full code  Family Communication: Discussed with the patient  Disposition Plan: Not ready for discharge     LOS: 1 day   [facility]  [facility] Pager [phone] 09/05/2013, 1:45 PM  If 8PM-8AM, please contact night-coverage at [email or web address], password [facility]

## 2013-09-06 DIAGNOSIS — J209 Acute bronchitis, unspecified: Secondary | ICD-10-CM

## 2013-09-06 DIAGNOSIS — D649 Anemia, unspecified: Secondary | ICD-10-CM

## 2013-09-06 NOTE — Progress Notes (Signed)
[facility] PROGRESS NOTE  Dennis Bernard [id number]:[id number] DOB: Mar 04, 1986 DOA: 09/04/2013  Brief HPI: Dennis Bernard is a 27 y.o. male with a past medical history of sickle cell disease, who was recently hospitalized for sickle cell pain crises. He received blood transfusions during that hospitalization. He presented with worsening pain over last 3 days and fever. He was admitted for sickle cell crisis.  Assessment/Plan:  Sickle cell Anemia with pain and crises -still in severe pain -still rating pain at 9, only used 1.2mg  in last 4 hours, probably coz he was sleeping most of that time - Continue Dilaudid PCA and Toradol. - Continue IV fluids. Dr. Cyndie Chime flagged in Epic.  -According to multiple previous notes per hematology he should be transfused only if hemoglobin drops below 5. Whenever he needs to be transfused he should be premedicated with Solu-Medrol 40 mg IV, Tylenol, 650 mg by mouth, Benadryl, 50 mg by mouth. LFT's unremarkable.  -hb 5.4 yesterday, no labs drawn today, repeat tomorrow  Chest tightness -improved -I agree this is most likely secondary to sickle cell crises. EKG is nonischemic. Chest x-ray does not show any acute findings. Continue to monitor closely  Fracture of the phalanx of left ring finger Can follow with Dr. Amanda Pea as OP.  DVT Prophylaxis: Lovenox   Code Status: Full code  Family Communication: Discussed with the patient  Disposition Plan: Not ready for discharge  Consultants: None  Procedures: None  Antibiotics: None  Subjective: Rating pain at 9, but was sleeping on my arrival and hadnt used PCA much as a result, some nausea  Objective: Vital Signs  Filed Vitals:   09/06/13 0506 09/06/13 0624 09/06/13 0628 09/06/13 0816  BP:  100/52    Pulse:  66    Temp:  97.5 F (36.4 C)    TempSrc:  Oral    Resp: 10 10 10 16   Height:      Weight:      SpO2: 100% 100% 100% 98%    Intake/Output Summary (Last 24 hours) at 09/06/13  0947 Last data filed at 09/06/13 0700  Gross per 24 hour  Intake 2625.11 ml  Output   3925 ml  Net -1299.89 ml   Filed Weights   09/04/13 1729  Weight: 76.93 kg (169 lb 9.6 oz)   General appearance: alert, cooperative, appears stated age and no distress Head: Normocephalic, without obvious abnormality, atraumatic Resp: poor air movement but clear to auscultation bilaterally Cardio: regular rate and rhythm, S1, S2 normal, no murmur, click, rub or gallop GI: soft, distended, non-tender; bowel sounds normal; no masses,  no organomegaly Extremities: extremities normal, atraumatic, no cyanosis or edema Neurologic: No focal deficits  Lab Results:  Basic Metabolic Panel:  Recent Labs Lab 09/04/13 1155 09/05/13 0455  NA 139 136  K 4.4 4.0  CL 107 104  CO2 22 22  GLUCOSE 91 80  BUN 19 20  CREATININE 1.09 0.95  CALCIUM 9.0 8.6  MG  --  2.2   Liver Function Tests:  Recent Labs Lab 09/04/13 1655 09/05/13 0455  AST 25 24  ALT 13 12  ALKPHOS 112 105  BILITOT 1.3* 1.0  PROT 6.8 6.3  ALBUMIN 3.2* 3.1*   CBC:  Recent Labs Lab 09/04/13 1155 09/05/13 0455  WBC 8.6 8.9  NEUTROABS 5.0 2.3  HGB 5.9* 5.4*  HCT 17.8* 15.9*  MCV 99.4 97.5  PLT 691* 560*   Cardiac Enzymes:  Recent Labs Lab 09/04/13 1655 09/04/13 2305 09/05/13 0455  TROPONINI <0.30 <0.30 <  0.30    Studies/Results: Dg Chest 2 View  09/04/2013   CLINICAL DATA:  Fever, sickle cell crisis.  EXAM: CHEST  2 VIEW  COMPARISON:  August 20, 2013.  FINDINGS: Interval placement of right-sided Port-A-Cath with distal tip in expected position of the SVC. Cardiomediastinal silhouette appears normal. Left shoulder arthroplasty is noted. No pneumothorax or pleural effusion is noted. Stable interstitial densities are noted throughout both lungs. No acute pulmonary disease is noted.  IMPRESSION: Stable interstitial densities compared to prior exam. No acute cardiopulmonary abnormality seen.   Electronically Signed   By:  Roque Lias M.D.   On: 09/04/2013 11:26    Medications:  Scheduled: . enoxaparin (LOVENOX) injection  40 mg Subcutaneous Q24H  . folic acid  1 mg Oral q morning - 10a  . HYDROmorphone PCA 0.3 mg/mL   Intravenous Q4H  . morphine  60 mg Oral BID  . pantoprazole  40 mg Oral Daily  . polyethylene glycol  17 g Oral BID  . senna-docusate  1 tablet Oral BID   Continuous: . sodium chloride 100 mL/hr at 09/06/13 0981   XBJ:[id number], acetaminophen, diphenhydrAMINE (BENADRYL) IVPB(SICKLE CELL ONLY), diphenhydrAMINE, naloxone, ondansetron (ZOFRAN) IV, ondansetron, sodium chloride    LOS: 2 days   [facility]  [facility] Pager [phone]  09/06/2013, 9:47 AM  If 8PM-8AM, please contact night-coverage at [email or web address], password [facility]

## 2013-09-07 DIAGNOSIS — F1920 Other psychoactive substance dependence, uncomplicated: Secondary | ICD-10-CM

## 2013-09-07 DIAGNOSIS — T889XXS Complication of surgical and medical care, unspecified, sequela: Secondary | ICD-10-CM

## 2013-09-07 LAB — CBC
HCT: 17 % — ABNORMAL LOW (ref 39.0–52.0)
Hemoglobin: 5.5 g/dL — CL (ref 13.0–17.0)
MCH: 30.6 pg (ref 26.0–34.0)
MCHC: 32.4 g/dL (ref 30.0–36.0)
MCV: 94.4 fL (ref 78.0–100.0)
Platelets: 474 10*3/uL — ABNORMAL HIGH (ref 150–400)
WBC: 8.2 10*3/uL (ref 4.0–10.5)

## 2013-09-07 MED ORDER — OXYCODONE HCL 5 MG PO TABS
10.0000 mg | ORAL_TABLET | ORAL | Status: DC | PRN
  Administered 2013-09-07 – 2013-09-15 (×34): 10 mg via ORAL
  Filled 2013-09-07 (×34): qty 2

## 2013-09-07 NOTE — Progress Notes (Addendum)
[facility] PROGRESS NOTE  Dennis Bernard UEA:[id number] DOB: 1986-06-18 DOA: 09/04/2013 PCP: No primary provider on file.  Brief narrative: 27 y.o. male with a past medical history of sickle cell disease, frequent admissions for sickle cell pain crisis who was admitted 09/04/2013 to [facility] ED for generalized pain related to sickle cell crisis.   Assessment/Plan:   Principal Problem: Sickle cell Anemia with pain and crises  - continue same long acting pain meds: MS contin 60 mg PO BID - added dilaudid PCA for better pain control - pain rated 10/10 this am; will switch to PO once pain 7/10 in intensity - continue folic acid - Kpad as needed  Active Problems: Sickle cell anemia - hemoglobin is 5.4, 5.5; appears to be chronically low; has lot of antibodies and hard to match the blood - continue to monitor CBC but not every day Chest tightness  - the 12 lead EKG did not show acute ischemic changes. CXR with no acute cardiopulmonary findings  Fracture of the phalanx of left ring finger  - Can follow with Dr. Amanda Pea as OP.   Code Status: full code Family Communication: no family at the bedside Disposition Plan: home when stable  [name], MD  [facility] Pager [phone]  If 7PM-7AM, please contact night-coverage [email or web address] Password [email or web address] 09/07/2013, 7:12 AM   LOS: 3 days   Consultants:  None   Procedures:  None   Antibiotics:  None   HPI/Subjective: No overnight events.   Objective: Filed Vitals:   09/07/13 0012 09/07/13 0215 09/07/13 0315 09/07/13 0533  BP:  106/40  92/52  Pulse:  71  68  Temp:  98.6 F (37 C)  97.6 F (36.4 C)  TempSrc:  Oral  Oral  Resp: 14 14 18 16   Height:      Weight:      SpO2: 100% 100% 100% 99%    Intake/Output Summary (Last 24 hours) at 09/07/13 2130 Last data filed at 09/07/13 8657  Gross per 24 hour  Intake 2533.33 ml  Output   2625 ml  Net -91.67 ml    Exam:   General:  Pt is alert, follows  commands appropriately, not in acute distress  Cardiovascular: Regular rate and rhythm, S1/S2 appreciated   Respiratory: Clear to auscultation bilaterally, no wheezing, no crackles, no rhonchi  Abdomen: Soft, non tender, non distended, bowel sounds present, no guarding  Extremities: No edema, pulses DP and PT palpable bilaterally  Neuro: Grossly nonfocal  Data Reviewed: Basic Metabolic Panel:  Recent Labs Lab 09/04/13 1155 09/05/13 0455  NA 139 136  K 4.4 4.0  CL 107 104  CO2 22 22  GLUCOSE 91 80  BUN 19 20  CREATININE 1.09 0.95  CALCIUM 9.0 8.6  MG  --  2.2   Liver Function Tests:  Recent Labs Lab 09/04/13 1655 09/05/13 0455  AST 25 24  ALT 13 12  ALKPHOS 112 105  BILITOT 1.3* 1.0  PROT 6.8 6.3  ALBUMIN 3.2* 3.1*   No results found for this basename: LIPASE, AMYLASE,  in the last 168 hours No results found for this basename: AMMONIA,  in the last 168 hours CBC:  Recent Labs Lab 09/04/13 1155 09/05/13 0455 09/07/13 0435  WBC 8.6 8.9 PENDING  NEUTROABS 5.0 2.3  --   HGB 5.9* 5.4* 5.5*  HCT 17.8* 15.9* 17.0*  MCV 99.4 97.5 94.4  PLT 691* 560* 474*   Cardiac Enzymes:  Recent Labs Lab 09/04/13 1655 09/04/13 2305 09/05/13 0455  TROPONINI <  0.30 <0.30 <0.30   BNP: No components found with this basename: POCBNP,  CBG: No results found for this basename: GLUCAP,  in the last 168 hours  No results found for this or any previous visit (from the past 240 hour(s)).   Studies: No results found.  Scheduled Meds: . enoxaparin (LOVENOX) injection  40 mg Subcutaneous Q24H  . folic acid  1 mg Oral q morning - 10a  . HYDROmorphone PCA 0.3 mg/mL   Intravenous Q4H  . morphine  60 mg Oral BID  . pantoprazole  40 mg Oral Daily  . polyethylene glycol  17 g Oral BID  . senna-docusate  1 tablet Oral BID   Continuous Infusions: . sodium chloride 100 mL/hr at 09/07/13 0125

## 2013-09-08 DIAGNOSIS — K5900 Constipation, unspecified: Secondary | ICD-10-CM

## 2013-09-08 DIAGNOSIS — M549 Dorsalgia, unspecified: Secondary | ICD-10-CM

## 2013-09-08 NOTE — Progress Notes (Signed)
[facility] PROGRESS NOTE  Dennis Bernard ZOX:[id number] DOB: 10-12-1985 DOA: 09/04/2013 PCP: No primary provider on file.  Brief narrative: 27 y.o. male with a past medical history of sickle cell disease, frequent admissions for sickle cell pain crisis who was admitted 09/04/2013 to [facility] ED for generalized pain related to sickle cell crisis.   Assessment/Plan:   Principal Problem:  Sickle cell Anemia with pain and crises  - continue same long acting pain meds: MS contin 60 mg PO BID  - contiune dilaudid PCA, pain rated 8/10 this am - continue folic acid  - Kpad as needed   Active Problems:  Sickle cell anemia  - hemoglobin is 5.4, 5.5; appears to be chronically low; has lot of antibodies and hard to match the blood  Chest tightness  - the 12 lead EKG did not show acute ischemic changes. CXR with no acute cardiopulmonary findings  Fracture of the phalanx of left ring finger  - Can follow with Dr. Amanda Pea as OP.   Code Status: full code  Family Communication: no family at the bedside  Disposition Plan: home when stable    Consultants:  None  Procedures:  None  Antibiotics:  None    [name], MD  [facility] Pager [phone]  If 7PM-7AM, please contact night-coverage [email or web address] Password [facility] 09/08/2013, 10:44 AM   LOS: 4 days    HPI/Subjective: No acute overnight events.  Objective: Filed Vitals:   09/08/13 0227 09/08/13 0438 09/08/13 0612 09/08/13 0745  BP: 98/49  107/55   Pulse: 73  71   Temp: 97.7 F (36.5 C)  98 F (36.7 C)   TempSrc: Oral  Oral   Resp: 12 12 12 9   Height:      Weight:      SpO2: 100% 100% 100% 100%    Intake/Output Summary (Last 24 hours) at 09/08/13 1044 Last data filed at 09/08/13 0900  Gross per 24 hour  Intake 3636.67 ml  Output   2925 ml  Net 711.67 ml    Exam:   General:  Pt is alert, follows commands appropriately, not in acute distress  Cardiovascular: Regular rate and rhythm, S1/S2, no  murmurs, no rubs, no gallops  Respiratory: Clear to auscultation bilaterally, no wheezing, no crackles, no rhonchi  Abdomen: Soft, non tender, non distended, bowel sounds present, no guarding  Extremities: No edema, pulses DP and PT palpable bilaterally  Neuro: Grossly nonfocal  Data Reviewed: Basic Metabolic Panel:  Recent Labs Lab 09/04/13 1155 09/05/13 0455  NA 139 136  K 4.4 4.0  CL 107 104  CO2 22 22  GLUCOSE 91 80  BUN 19 20  CREATININE 1.09 0.95  CALCIUM 9.0 8.6  MG  --  2.2   Liver Function Tests:  Recent Labs Lab 09/04/13 1655 09/05/13 0455  AST 25 24  ALT 13 12  ALKPHOS 112 105  BILITOT 1.3* 1.0  PROT 6.8 6.3  ALBUMIN 3.2* 3.1*   No results found for this basename: LIPASE, AMYLASE,  in the last 168 hours No results found for this basename: AMMONIA,  in the last 168 hours CBC:  Recent Labs Lab 09/04/13 1155 09/05/13 0455 09/07/13 0435  WBC 8.6 8.9 8.2  NEUTROABS 5.0 2.3  --   HGB 5.9* 5.4* 5.5*  HCT 17.8* 15.9* 17.0*  MCV 99.4 97.5 94.4  PLT 691* 560* 474*   Cardiac Enzymes:  Recent Labs Lab 09/04/13 1655 09/04/13 2305 09/05/13 0455  TROPONINI <0.30 <0.30 <0.30   BNP: No components  found with this basename: POCBNP,  CBG: No results found for this basename: GLUCAP,  in the last 168 hours  No results found for this or any previous visit (from the past 240 hour(s)).   Studies: No results found.  Scheduled Meds: . enoxaparin (LOVENOX) injection  40 mg Subcutaneous Q24H  . folic acid  1 mg Oral q morning - 10a  . HYDROmorphone PCA 0.3 mg/mL   Intravenous Q4H  . morphine  60 mg Oral BID  . pantoprazole  40 mg Oral Daily  . polyethylene glycol  17 g Oral BID  . senna-docusate  1 tablet Oral BID   Continuous Infusions: . sodium chloride 100 mL/hr at 09/08/13 [phone]

## 2013-09-09 DIAGNOSIS — J811 Chronic pulmonary edema: Secondary | ICD-10-CM

## 2013-09-09 MED ORDER — PROMETHAZINE HCL 25 MG/ML IJ SOLN
25.0000 mg | Freq: Four times a day (QID) | INTRAMUSCULAR | Status: DC | PRN
  Administered 2013-09-09 – 2013-09-15 (×23): 25 mg via INTRAVENOUS
  Filled 2013-09-09 (×23): qty 1

## 2013-09-09 MED ORDER — SODIUM CHLORIDE 0.9 % IJ SOLN
10.0000 mL | INTRAMUSCULAR | Status: DC | PRN
Start: 2013-09-09 — End: 2013-09-15
  Administered 2013-09-10 – 2013-09-12 (×2): 10 mL

## 2013-09-09 NOTE — Progress Notes (Signed)
[facility] PROGRESS NOTE  Dennis Bernard ZOX:[id number] DOB: 1986/02/24 DOA: 09/04/2013 PCP: No primary provider on file.  Brief narrative: 27 y.o. male with a past medical history of sickle cell disease, frequent admissions for sickle cell pain crisis who was admitted 09/04/2013 to [facility] ED for generalized pain related to sickle cell crisis.   Assessment/Plan:   Principal Problem:  Sickle cell Anemia with pain and crises  - continue same long acting pain meds: MS contin 60 mg PO BID  - contiune dilaudid PCA, pain rated 8/10 today  - continue folic acid  - Kpad as needed   Active Problems:  Sickle cell anemia  - hemoglobin is 5.4, 5.5; appears to be chronically low; has lot of antibodies and hard to match the blood  - check hemoglobin in am Chest tightness  - the 12 lead EKG did not show acute ischemic changes. CXR with no acute cardiopulmonary findings - cardiac enzymes negative  X 3 Fracture of the phalanx of left ring finger  - Can follow with Dr. Amanda Pea as OP.   Code Status: full code  Family Communication: no family at the bedside  Disposition Plan: home when stable   Consultants:  None  Procedures:  None  Antibiotics:  None    [name], MD  [facility] Pager [phone]  If 7PM-7AM, please contact night-coverage [email or web address] Password [facility] 09/09/2013, 11:56 AM   LOS: 5 days    HPI/Subjective: Pain 8/10 in intensity this am.  Objective: Filed Vitals:   09/09/13 0400 09/09/13 0500 09/09/13 0555 09/09/13 1000  BP:   95/50 104/60  Pulse:   78 68  Temp:   98.3 F (36.8 C) 98.3 F (36.8 C)  TempSrc:   Oral Oral  Resp: 10 12 16 16   Height:      Weight:      SpO2: 100% 100% 98% 100%    Intake/Output Summary (Last 24 hours) at 09/09/13 1156 Last data filed at 09/09/13 0947  Gross per 24 hour  Intake   1550 ml  Output   2575 ml  Net  -1025 ml    Exam:   General:  Pt is alert, follows commands appropriately, not in acute  distress  Cardiovascular: Regular rate and rhythm, S1/S2, no murmurs, no rubs, no gallops  Respiratory: Clear to auscultation bilaterally, no wheezing, no crackles, no rhonchi  Abdomen: Soft, non tender, non distended, bowel sounds present, no guarding  Extremities: No edema, pulses DP and PT palpable bilaterally  Neuro: Grossly nonfocal  Data Reviewed: Basic Metabolic Panel:  Recent Labs Lab 09/04/13 1155 09/05/13 0455  NA 139 136  K 4.4 4.0  CL 107 104  CO2 22 22  GLUCOSE 91 80  BUN 19 20  CREATININE 1.09 0.95  CALCIUM 9.0 8.6  MG  --  2.2   Liver Function Tests:  Recent Labs Lab 09/04/13 1655 09/05/13 0455  AST 25 24  ALT 13 12  ALKPHOS 112 105  BILITOT 1.3* 1.0  PROT 6.8 6.3  ALBUMIN 3.2* 3.1*   No results found for this basename: LIPASE, AMYLASE,  in the last 168 hours No results found for this basename: AMMONIA,  in the last 168 hours CBC:  Recent Labs Lab 09/04/13 1155 09/05/13 0455 09/07/13 0435  WBC 8.6 8.9 8.2  NEUTROABS 5.0 2.3  --   HGB 5.9* 5.4* 5.5*  HCT 17.8* 15.9* 17.0*  MCV 99.4 97.5 94.4  PLT 691* 560* 474*   Cardiac Enzymes:  Recent Labs Lab 09/04/13  1655 09/04/13 2305 09/05/13 0455  TROPONINI <0.30 <0.30 <0.30   BNP: No components found with this basename: POCBNP,  CBG: No results found for this basename: GLUCAP,  in the last 168 hours  No results found for this or any previous visit (from the past 240 hour(s)).   Studies: No results found.  Scheduled Meds: . enoxaparin (LOVENOX) injection  40 mg Subcutaneous Q24H  . folic acid  1 mg Oral q morning - 10a  . HYDROmorphone PCA 0.3 mg/mL   Intravenous Q4H  . morphine  60 mg Oral BID  . pantoprazole  40 mg Oral Daily  . polyethylene glycol  17 g Oral BID  . senna-docusate  1 tablet Oral BID   Continuous Infusions: . sodium chloride 100 mL/hr at 09/09/13 [phone]

## 2013-09-10 DIAGNOSIS — Z9119 Patient's noncompliance with other medical treatment and regimen: Secondary | ICD-10-CM

## 2013-09-10 LAB — HEMOGLOBIN AND HEMATOCRIT, BLOOD: Hemoglobin: 5.5 g/dL — CL (ref 13.0–17.0)

## 2013-09-10 NOTE — Progress Notes (Signed)
[facility] PROGRESS NOTE  Dennis Bernard WUX:[id number] DOB: 1986-05-07 DOA: 09/04/2013 PCP: No primary provider on file.  Brief narrative: 27 y.o. male with a past medical history of sickle cell disease, frequent admissions for sickle cell pain crisis who was admitted 09/04/2013 to [facility] ED for generalized pain related to sickle cell crisis.   Assessment/Plan:   Principal Problem:  Sickle cell Anemia with pain and crises  - continue home long acting pain meds: MS contin 60 mg PO BID  - contiune dilaudid PCA, pain rated 8/10 today  - continue folic acid  - Kpad as needed   Active Problems:  Sickle cell anemia  - hemoglobin is 5.4, 5.5; appears to be chronically low; has lot of antibodies and hard to match the blood  - repeat hemoglobin this am is 5.5 Chest tightness  - the 12 lead EKG did not show acute ischemic changes. CXR with no acute cardiopulmonary findings  - cardiac enzymes negative X 3  Fracture of the phalanx of left ring finger  - Can follow with Dr. Amanda Pea as OP.   Code Status: full code  Family Communication: no family at the bedside  Disposition Plan: home when stable; likely in next 24 hours   Consultants:  None  Procedures:  None  Antibiotics:  None   [name], MD  [facility] Pager [phone]  If 7PM-7AM, please contact night-coverage [email or web address] Password [facility] 09/10/2013, 3:59 PM   LOS: 6 days    HPI/Subjective: No acute overnight events.   Objective: Filed Vitals:   09/10/13 1012 09/10/13 1027 09/10/13 1235 09/10/13 1437  BP: 109/58   104/53  Pulse: 92   87  Temp: 98.5 F (36.9 C)   98.6 F (37 C)  TempSrc: Oral   Oral  Resp: 16 14 14 15   Height:      Weight:      SpO2: 100% 100% 100% 100%    Intake/Output Summary (Last 24 hours) at 09/10/13 1559 Last data filed at 09/10/13 1438  Gross per 24 hour  Intake   1500 ml  Output   3500 ml  Net  -2000 ml    Exam:   General:  Pt is alert, follows commands  appropriately, not in acute distress  Cardiovascular: Regular rate and rhythm, S1/S2, no murmurs, no rubs, no gallops  Respiratory: Clear to auscultation bilaterally, no wheezing, no crackles, no rhonchi  Abdomen: Soft, non tender, non distended, bowel sounds present, no guarding  Extremities: No edema, pulses DP and PT palpable bilaterally  Neuro: Grossly nonfocal  Data Reviewed: Basic Metabolic Panel:  Recent Labs Lab 09/04/13 1155 09/05/13 0455  NA 139 136  K 4.4 4.0  CL 107 104  CO2 22 22  GLUCOSE 91 80  BUN 19 20  CREATININE 1.09 0.95  CALCIUM 9.0 8.6  MG  --  2.2   Liver Function Tests:  Recent Labs Lab 09/04/13 1655 09/05/13 0455  AST 25 24  ALT 13 12  ALKPHOS 112 105  BILITOT 1.3* 1.0  PROT 6.8 6.3  ALBUMIN 3.2* 3.1*   No results found for this basename: LIPASE, AMYLASE,  in the last 168 hours No results found for this basename: AMMONIA,  in the last 168 hours CBC:  Recent Labs Lab 09/04/13 1155 09/05/13 0455 09/07/13 0435 09/10/13 0425  WBC 8.6 8.9 8.2  --   NEUTROABS 5.0 2.3  --   --   HGB 5.9* 5.4* 5.5* 5.5*  HCT 17.8* 15.9* 17.0* 16.3*  MCV 99.4  97.5 94.4  --   PLT 691* 560* 474*  --    Cardiac Enzymes:  Recent Labs Lab 09/04/13 1655 09/04/13 2305 09/05/13 0455  TROPONINI <0.30 <0.30 <0.30   BNP: No components found with this basename: POCBNP,  CBG: No results found for this basename: GLUCAP,  in the last 168 hours  No results found for this or any previous visit (from the past 240 hour(s)).   Studies: No results found.  Scheduled Meds: . enoxaparin (LOVENOX) injection  40 mg Subcutaneous Q24H  . folic acid  1 mg Oral q morning - 10a  . HYDROmorphone PCA 0.3 mg/mL   Intravenous Q4H  . morphine  60 mg Oral BID  . pantoprazole  40 mg Oral Daily  . polyethylene glycol  17 g Oral BID  . senna-docusate  1 tablet Oral BID   Continuous Infusions: . sodium chloride 100 mL/hr at 09/10/13 0945

## 2013-09-11 NOTE — Progress Notes (Signed)
[facility] PROGRESS NOTE  Takahiro Godinho VHQ:[id number] DOB: [birth year] DOA: 09/04/2013 PCP: No primary provider on file.  Brief narrative: 27 y.o. male with a past medical history of sickle cell disease, frequent admissions for sickle cell pain crisis who was admitted 09/04/2013 to [facility] ED for generalized pain related to sickle cell crisis.   Assessment/Plan:   Principal Problem:  Sickle cell Anemia with pain and crises  - continue home long acting pain meds: MS contin 60 mg PO BID  - contiune dilaudid PCA, pain rated 8/10 today; this is same since the admission. I talked to him extensively about need to wean PCA and start PO meds and he was agreeable to start PO in am with plan to go home in am - continue folic acid  - Kpad as needed  Active Problems:  Sickle cell anemia  - hemoglobin is 5.4, 5.5; appears to be chronically low; has lot of antibodies and hard to match the blood  - repeat hemoglobin this am is 5.5  Chest tightness  - the 12 lead EKG did not show acute ischemic changes. CXR with no acute cardiopulmonary findings  - cardiac enzymes negative X 3  Fracture of the phalanx of left ring finger  - Can follow with Dr. Amanda Pea as OP.   Code Status: full code  Family Communication: no family at the bedside  Disposition Plan: home when stable; likely in next 24 hours   Consultants:  None  Procedures:  None    [name], MD  [facility] Pager [phone]  If 7PM-7AM, please contact night-coverage [email or web address] Password [facility] 09/11/2013, 4:36 PM   LOS: 7 days    HPI/Subjective: Rate pain 8/10 this am.   Objective: Filed Vitals:   09/11/13 0732 09/11/13 1146 09/11/13 1446 09/11/13 1528  BP:   118/58   Pulse:   106   Temp:   98.1 F (36.7 C)   TempSrc:   Oral   Resp: 19 18 18 22   Height:      Weight:      SpO2: 99%  100% 99%    Intake/Output Summary (Last 24 hours) at 09/11/13 1636 Last data filed at 09/11/13 1500  Gross per 24 hour   Intake 3518.5 ml  Output   3525 ml  Net   -6.5 ml    Exam:   General:  Pt is alert, follows commands appropriately, not in acute distress  Cardiovascular: Regular rate and rhythm, S1/S2, no murmurs, no rubs, no gallops  Respiratory: Clear to auscultation bilaterally, no wheezing, no crackles, no rhonchi  Abdomen: Soft, non tender, non distended, bowel sounds present, no guarding  Extremities: No edema, pulses DP and PT palpable bilaterally  Neuro: Grossly nonfocal  Data Reviewed: Basic Metabolic Panel:  Recent Labs Lab 09/05/13 0455  NA 136  K 4.0  CL 104  CO2 22  GLUCOSE 80  BUN 20  CREATININE 0.95  CALCIUM 8.6  MG 2.2   Liver Function Tests:  Recent Labs Lab 09/04/13 1655 09/05/13 0455  AST 25 24  ALT 13 12  ALKPHOS 112 105  BILITOT 1.3* 1.0  PROT 6.8 6.3  ALBUMIN 3.2* 3.1*   No results found for this basename: LIPASE, AMYLASE,  in the last 168 hours No results found for this basename: AMMONIA,  in the last 168 hours CBC:  Recent Labs Lab 09/05/13 0455 09/07/13 0435 09/10/13 0425  WBC 8.9 8.2  --   NEUTROABS 2.3  --   --   HGB 5.4*  5.5* 5.5*  HCT 15.9* 17.0* 16.3*  MCV 97.5 94.4  --   PLT 560* 474*  --    Cardiac Enzymes:  Recent Labs Lab 09/04/13 1655 09/04/13 2305 09/05/13 0455  TROPONINI <0.30 <0.30 <0.30   BNP: No components found with this basename: POCBNP,  CBG: No results found for this basename: GLUCAP,  in the last 168 hours  No results found for this or any previous visit (from the past 240 hour(s)).   Studies: No results found.  Scheduled Meds: . enoxaparin (LOVENOX)   40 mg Subcutaneous Q24H  . folic acid  1 mg Oral q morning - 10a  . HYDROmorphone PCA   Intravenous Q4H  . morphine  60 mg Oral BID  . pantoprazole  40 mg Oral Daily  . polyethylene glycol  17 g Oral BID  . senna-docusate  1 tablet Oral BID   Continuous Infusions: . sodium chloride 100 mL/hr at 09/11/13 1140

## 2013-09-12 ENCOUNTER — Inpatient Hospital Stay (HOSPITAL_COMMUNITY): Payer: Medicare Other

## 2013-09-12 DIAGNOSIS — G8929 Other chronic pain: Secondary | ICD-10-CM

## 2013-09-12 LAB — COMPREHENSIVE METABOLIC PANEL
ALT: 27 U/L (ref 0–53)
AST: 52 U/L — ABNORMAL HIGH (ref 0–37)
Alkaline Phosphatase: 109 U/L (ref 39–117)
BUN: 13 mg/dL (ref 6–23)
CO2: 22 mEq/L (ref 19–32)
Calcium: 8.9 mg/dL (ref 8.4–10.5)
Chloride: 108 mEq/L (ref 96–112)
GFR calc Af Amer: >90 mL/min (ref >90)
GFR calc non Af Amer: >90 mL/min (ref >90)
Glucose, Bld: 108 mg/dL — ABNORMAL HIGH (ref 70–99)
Sodium: 142 mEq/L (ref 137–147)
Total Bilirubin: 2.3 mg/dL — ABNORMAL HIGH (ref 0.3–1.2)

## 2013-09-12 LAB — CBC WITH DIFFERENTIAL/PLATELET
Basophils Absolute: 0.1 10*3/uL (ref 0.0–0.1)
Eosinophils Relative: 4 % (ref 0–5)
HCT: 14.3 % — ABNORMAL LOW (ref 39.0–52.0)
Lymphocytes Relative: 12 % (ref 12–46)
Lymphs Abs: 2.3 10*3/uL (ref 0.7–4.0)
MCHC: 34.3 g/dL (ref 30.0–36.0)
MCV: 91.1 fL (ref 78.0–100.0)
Neutro Abs: 14.3 10*3/uL — ABNORMAL HIGH (ref 1.7–7.7)
Platelets: 336 10*3/uL (ref 150–400)
RBC: 1.57 MIL/uL — ABNORMAL LOW (ref 4.22–5.81)
RDW: 19.9 % — ABNORMAL HIGH (ref 11.5–15.5)
WBC: 19.3 10*3/uL — ABNORMAL HIGH (ref 4.0–10.5)

## 2013-09-12 LAB — URINALYSIS, ROUTINE W REFLEX MICROSCOPIC
Bilirubin Urine: NEGATIVE
Glucose, UA: NEGATIVE mg/dL
Hgb urine dipstick: NEGATIVE
Ketones, ur: NEGATIVE mg/dL
Nitrite: NEGATIVE
Specific Gravity, Urine: 1.012 (ref 1.005–1.030)
pH: 5.5 (ref 5.0–8.0)

## 2013-09-12 LAB — INFLUENZA PANEL BY PCR (TYPE A & B): H1N1 flu by pcr: NOT DETECTED

## 2013-09-12 MED ORDER — HYDROXYUREA 500 MG PO CAPS: 500.0000 mg | ORAL_CAPSULE | Freq: Every day | ORAL | Status: DC

## 2013-09-12 MED ORDER — ADULT MULTIVITAMIN W/MINERALS CH
1.0000 | ORAL_TABLET | Freq: Every day | ORAL | Status: DC
  Administered 2013-09-12 – 2013-09-15 (×4): 1 via ORAL
  Filled 2013-09-12 (×4): qty 1

## 2013-09-12 MED ORDER — ACETAMINOPHEN 325 MG PO TABS
650.0000 mg | ORAL_TABLET | ORAL | Status: DC | PRN
  Administered 2013-09-12 – 2013-09-14 (×3): 650 mg via ORAL
  Filled 2013-09-12 (×3): qty 2

## 2013-09-12 MED ORDER — JUVEN PO PACK
1.0000 | PACK | ORAL | Status: DC
  Administered 2013-09-12 – 2013-09-13 (×2): 1 via ORAL
  Filled 2013-09-12 (×4): qty 1

## 2013-09-12 MED ORDER — HYDROXYUREA 500 MG PO CAPS
500.0000 mg | ORAL_CAPSULE | Freq: Three times a day (TID) | ORAL | Status: DC
  Administered 2013-09-12 – 2013-09-15 (×10): 500 mg via ORAL
  Filled 2013-09-12 (×12): qty 1

## 2013-09-12 MED ORDER — ENSURE COMPLETE PO LIQD
237.0000 mL | Freq: Two times a day (BID) | ORAL | Status: DC
  Administered 2013-09-12 – 2013-09-15 (×6): 237 mL via ORAL

## 2013-09-12 MED ORDER — ACETAMINOPHEN 650 MG RE SUPP: 650.0000 mg | RECTAL | Status: DC | PRN

## 2013-09-12 NOTE — Progress Notes (Signed)
[facility] PROGRESS NOTE  Dennis Bernard YQM:[id number] DOB: December 28, 1985 DOA: 09/04/2013 PCP: No primary provider on file.  Brief narrative: 27 y.o. male with a past medical history of sickle cell disease, frequent admissions for sickle cell pain crisis who was admitted 09/04/2013 to [facility] ED for generalized pain related to sickle cell crisis. Appreciate Dr. Patsy Lager recommendations. 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 Preference per hematology is to transfer the pt to [facility] for further management, will see if can arrange the transfer.   Assessment/Plan:   Principal Problem:  Fever - unclear etiology, UA negative, Tmax 102.4 F - CXR did not show acute cardiopulmonary findings  - not on antibiotics - follow up blood culture results - continue to monitor fever curve - also, follow up influenza PCR result Active Problems:  Sickle cell vaso-occlusive pain crisis - continue home long acting pain meds: MS contin 60 mg PO BID  - contiune dilaudid PCA, pain rated 8/10 today which is the same since the admission - continue folic acid  - Kpad as needed  Sickle cell anemia  - hemoglobin is 5.4, 5.5; appears to be chronically low; this am 4.9 - per hematology will start hydroxyurea 500 mg TID Chest tightness  - the 12 lead EKG did not show acute ischemic changes. CXR with no acute cardiopulmonary findings  - cardiac enzymes negative X 3  Fracture of the phalanx of left ring finger  - Can follow with Dr. Amanda Pea as OP.   Code Status: full code  Family Communication: no family at the bedside  Disposition Plan: remains inpatient   Consultants:  None  Procedures:  None    [name], MD  [facility] Pager [phone]  If 7PM-7AM, please contact night-coverage [email or web address] Password [facility] 09/12/2013, 2:55 PM   LOS: 8 days   HPI/Subjective: Still rates pain same, 8/10. Spiked fever overnight.   Objective: Filed  Vitals:   09/12/13 0430 09/12/13 0608 09/12/13 0817 09/12/13 1441  BP:  102/55    Pulse:  97    Temp:  98.4 F (36.9 C)    TempSrc:  Oral    Resp: 20 20 21 18   Height:      Weight:      SpO2:  100% 100% 100%    Intake/Output Summary (Last 24 hours) at 09/12/13 1455 Last data filed at 09/12/13 1254  Gross per 24 hour  Intake   2470 ml  Output   1600 ml  Net    870 ml    Exam:   General:  Pt is alert, follows commands appropriately, not in acute distress  Cardiovascular: Regular rate and rhythm, S1/S2, no murmurs, no rubs, no gallops  Respiratory: Clear to auscultation bilaterally, no wheezing, no crackles, no rhonchi  Abdomen: Soft, non tender, non distended, bowel sounds present, no guarding  Extremities: No edema, pulses DP and PT palpable bilaterally  Neuro: Grossly nonfocal  Data Reviewed: Basic Metabolic Panel:  Recent Labs Lab 09/12/13 0125  NA 142  K 3.9  CL 108  CO2 22  GLUCOSE 108*  BUN 13  CREATININE 0.86  CALCIUM 8.9   Liver Function Tests:  Recent Labs Lab 09/12/13 0125  AST 52*  ALT 27  ALKPHOS 109  BILITOT 2.3*  PROT 7.2  ALBUMIN 3.6   No results found for this basename: LIPASE, AMYLASE,  in the last 168 hours No results found for this basename: AMMONIA,  in the last 168 hours  CBC:  Recent Labs Lab 09/07/13 0435 09/10/13 0425 09/12/13 0125  WBC 8.2  --  19.3*  NEUTROABS  --   --  14.3*  HGB 5.5* 5.5* 4.9*  HCT 17.0* 16.3* 14.3*  MCV 94.4  --  91.1  PLT 474*  --  336   Cardiac Enzymes: No results found for this basename: CKTOTAL, CKMB, CKMBINDEX, TROPONINI,  in the last 168 hours BNP: No components found with this basename: POCBNP,  CBG: No results found for this basename: GLUCAP,  in the last 168 hours  No results found for this or any previous visit (from the past 240 hour(s)).   Studies: Dg Chest Port 1 View  09/12/2013   CLINICAL DATA:  Fever.  Sickle cell patient.  EXAM: PORTABLE CHEST - 1 VIEW  COMPARISON:   09/04/2013  FINDINGS: Shallow inspiration. Normal heart size and pulmonary vascularity. Central interstitial changes similar to previous study. No focal airspace disease or consolidation. No blunting of costophrenic angles. No pneumothorax. Stable appearance of right central venous catheter. Postoperative changes in the left shoulder. Sclerosis in the right humeral head may represent bone infarct. Go  IMPRESSION: No active disease.   Electronically Signed   By: Burman Nieves M.D.   On: 09/12/2013 01:30    Scheduled Meds: . enoxaparin (LOVENOX) injection  40 mg Subcutaneous Q24H  . feeding supplement (ENSURE COMPLETE)  237 mL Oral BID PC  . folic acid  1 mg Oral q morning - 10a  . HYDROmorphone PCA 0.3 mg/mL   Intravenous Q4H  . hydroxyurea  500 mg Oral TID  . morphine  60 mg Oral BID  . multivitamin with minerals  1 tablet Oral Daily  . nutrition supplement (JUVEN)  1 packet Oral Q24H  . pantoprazole  40 mg Oral Daily  . polyethylene glycol  17 g Oral BID  . senna-docusate  1 tablet Oral BID   Continuous Infusions: . sodium chloride 100 mL/hr at 09/12/13 1345

## 2013-09-12 NOTE — Progress Notes (Signed)
Orders placed in Epic by PA. Will continue to monitor.

## 2013-09-12 NOTE — Progress Notes (Signed)
PA text paged with result of Hgb of 4.9 and WBC of 19.3. Awaiting return call. Will continue to monitor.

## 2013-09-12 NOTE — Progress Notes (Signed)
INITIAL NUTRITION ASSESSMENT  DOCUMENTATION CODES Per approved criteria  -Not Applicable   INTERVENTION: Provide Ensure Complete BID in between meals Provide Multivitamin with minerals daily Provide Juven once daily  NUTRITION DIAGNOSIS: Inadequate oral intake related to nausea and decreased appetite as evidenced by pt's report of only eating one meal daily.   Goal: Pt to meet >/= 90% of their estimated nutrition needs   Monitor:  PO intake Weight Labs  Reason for Assessment: Length of Stay  27 y.o. male  Admitting Dx: <principal problem not specified>  ASSESSMENT: 27 y.o. male with a past medical history of sickle cell disease, frequent admissions for sickle cell pain crisis who was admitted 09/04/2013 to [facility] ED for generalized pain related to sickle cell crisis.  Pt reports that he has only been eating one meal daily since admission. He reports nausea in the morning and decreased appetite. Pt's usual body weight is 170 lbs; weight is fairly stable.   Height: Ht Readings from Last 1 Encounters:  09/04/13 5\' 9"  (1.753 m)    Weight: Wt Readings from Last 1 Encounters:  09/04/13 169 lb 9.6 oz (76.93 kg)    Ideal Body Weight: 160 lbs  % Ideal Body Weight: 106%  Wt Readings from Last 10 Encounters:  09/04/13 169 lb 9.6 oz (76.93 kg)  08/28/13 170 lb 13.7 oz (77.5 kg)  07/29/13 184 lb 8 oz (83.689 kg)  05/28/13 168 lb 14 oz (76.6 kg)  05/14/13 170 lb (77.111 kg)  03/27/13 175 lb 12.8 oz (79.742 kg)  02/20/13 174 lb 9.6 oz (79.198 kg)  02/14/13 172 lb (78.019 kg)  02/13/13 179 lb (81.194 kg)  01/19/13 170 lb (77.111 kg)    Usual Body Weight: 170 lbs  % Usual Body Weight: 99%  BMI:  Body mass index is 25.03 kg/(m^2).  Estimated Nutritional Needs: Kcal: 2100-2300 Protein: 80-90 grams Fluid: 2.3 L/day  Skin: non-pitting RUE, RLE, and LLE edema  Diet Order: General  EDUCATION NEEDS: -No education needs identified at this time   Intake/Output Summary  (Last 24 hours) at 09/12/13 1103 Last data filed at 09/12/13 0655  Gross per 24 hour  Intake   2350 ml  Output   1825 ml  Net    525 ml    Last BM: 12/26   Labs:   Recent Labs Lab 09/12/13 0125  NA 142  K 3.9  CL 108  CO2 22  BUN 13  CREATININE 0.86  CALCIUM 8.9  GLUCOSE 108*    CBG (last 3)  No results found for this basename: GLUCAP,  in the last 72 hours  Scheduled Meds: . enoxaparin (LOVENOX) injection  40 mg Subcutaneous Q24H  . folic acid  1 mg Oral q morning - 10a  . HYDROmorphone PCA 0.3 mg/mL   Intravenous Q4H  . hydroxyurea  500 mg Oral TID  . morphine  60 mg Oral BID  . pantoprazole  40 mg Oral Daily  . polyethylene glycol  17 g Oral BID  . senna-docusate  1 tablet Oral BID    Continuous Infusions: . sodium chloride 100 mL/hr at 09/11/13 1949    Past Medical History  Diagnosis Date  . Sickle cell disease   . Avascular necrosis of femur head, right   . H/O allergic rhinitis   . H/O hypokalemia   . Chronic pain syndrome   . H/O wheezing     with colds  . 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 Cholecystectomy  1995  . Portacath placement Left 07/04/2012  . Exchange transfusion  02/2008    perioperatively for shoulder surgery  . 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 Portacath placement Right November 2014    Ian Malkin RD, [facility] Inpatient Clinical Dietitian Pager: [phone] After Hours Pager: [phone]

## 2013-09-12 NOTE — Progress Notes (Signed)
He has been in almost continuous crisis now for months. 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  We have been accepting hemoglobins in the 5 gram range and trying to minimize transfusions. Hb today 4.9 Using routine pre-meds for all transfusions with solumedrol 40 mg, tylenol 650 mg, benadryl 50 mg to minimize reactions. Impression: Refractory sickle crisis Rec: No easy solutions I would start him back on Hydroxyurea [phone] mg TID I will keep him under consideration for a red cell exchange transfusion program: we should have this available at [facility] beginning mid February.  Limiting factor will be getting his initial Hb up to 10 grams which will be a major challenge. Consider transfer to [facility] sickle cell service.

## 2013-09-12 NOTE — Progress Notes (Signed)
Pt c/o chills and body aches. Fever 101.7. MD text paged. Awaiting order or return call.  Will continue to monitor

## 2013-09-13 NOTE — Progress Notes (Signed)
Upon entering pt room writer found pt standing at his IV pole with the IV tubing out of the chamber and un clapped. When asked what he was doing,  Pt stated "its leaking and there is air in the line. Patent attorney noticed a hole in the in the base of the IV bag that was hung at 0007. There was a small amount of IV fluid on top of the IV pump and none had leaked in the floor. Writer ask pt how the hole got in the IV bag, and pt stated " I don't know." writer replaced the bag of IV fluids, 10cc of air was removed from the IV tubing  and writer explained to the pt that the IV, IV  pump and tubing is only to be messed with by nursing staff, and the risk of him messing with his IV, IV pump and tubing. Pt verbalized understanding. Will continue to monitor.

## 2013-09-13 NOTE — Progress Notes (Signed)
Patient ID: Dennis Bernard, male   DOB: 1986/09/13, 27 y.o.   MRN: [id number]  [facility] PROGRESS NOTE  Dennis Bernard BJY:[id number] DOB: Jan 13, 1986 DOA: 09/04/2013 PCP: No primary provider on file.  Brief narrative: 27 y.o. male with a past medical history of sickle cell disease, frequent admissions for sickle cell pain crisis who was admitted 09/04/2013 to [facility] ED for generalized pain related to sickle cell crisis. Appreciate Dr. Patsy Lager recommendations. 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 Preference per hematology is to transfer the pt to [facility] for further management, will see if can arrange the transfer.   Assessment/Plan:  Principal Problem:  Fever  - unclear etiology, UA negative, Tmax 98.4 over the past 24 hours  - CXR did not show acute cardiopulmonary findings  - not on antibiotics  - follow up blood culture results which are negative to date  - continue to monitor fever curve  - influenza panel negative  Active Problems:  Sickle cell vaso-occlusive pain crisis  - continue home long acting pain meds: MS contin 60 mg PO BID  - contiune dilaudid PCA, pain rated 8/10 today which is the same since the admission  - continue folic acid  - Kpad as needed  Sickle cell anemia  - hemoglobin is 5.4, 5.5; appears to be chronically low; no CBC this AM - per hematology will start hydroxyurea 500 mg TID  Chest tightness  - the 12 lead EKG did not show acute ischemic changes. CXR with no acute cardiopulmonary findings  - cardiac enzymes negative X 3  Fracture of the phalanx of left ring finger  - Can follow with Dr. Amanda Pea as OP.   Code Status: full code  Family Communication: no family at the bedside  Disposition Plan: remains inpatient   Consultants:  None  Procedures:  None   [name], MD  [facility]  Pager [phone]   If 7PM-7AM, please contact night-coverage [email or web address] Password  [facility] 09/13/2013, 4:41 PM   LOS: 9 days    HPI/Subjective: Pt still reports pain, generalized.   Objective: Filed Vitals:   09/13/13 0800 09/13/13 1219 09/13/13 1420 09/13/13 1523  BP:   110/60   Pulse:   116   Temp:   97.8 F (36.6 C)   TempSrc:   Oral   Resp: 13 14 16 23   Height:      Weight:      SpO2: 100%  100% 99%    Intake/Output Summary (Last 24 hours) at 09/13/13 1641 Last data filed at 09/13/13 1500  Gross per 24 hour  Intake   2040 ml  Output   2145 ml  Net   -105 ml    Exam:   General:  Pt is alert, follows commands appropriately, not in acute distress  Cardiovascular: Regular rhythm, tachycardic, S1/S2, no murmurs, no rubs, no gallops  Respiratory: Clear to auscultation bilaterally, no wheezing, no crackles, no rhonchi  Abdomen: Soft, non tender, non distended, bowel sounds present, no guarding  Extremities: No edema, pulses DP and PT palpable bilaterally  Neuro: Grossly nonfocal  Data Reviewed: Basic Metabolic Panel:  Recent Labs Lab 09/12/13 0125  NA 142  K 3.9  CL 108  CO2 22  GLUCOSE 108*  BUN 13  CREATININE 0.86  CALCIUM 8.9   Liver Function Tests:  Recent Labs Lab 09/12/13 0125  AST 52*  ALT 27  ALKPHOS 109  BILITOT 2.3*  PROT 7.2  ALBUMIN 3.6  CBC:  Recent Labs Lab 09/07/13 0435 09/10/13 0425 09/12/13 0125  WBC 8.2  --  19.3*  NEUTROABS  --   --  14.3*  HGB 5.5* 5.5* 4.9*  HCT 17.0* 16.3* 14.3*  MCV 94.4  --  91.1  PLT 474*  --  336   Recent Results (from the past 240 hour(s))  CULTURE, BLOOD (ROUTINE X 2)     Status: None   Collection Time    09/12/13  2:40 AM      Result Value Range Status   Specimen Description BLOOD LEFT ANTECUBITAL   Final   Special Requests BOTTLES DRAWN AEROBIC ONLY 1CC   Final   Culture  Setup Time     Final   Value: 09/12/2013 08:58     Performed at [facility]   Culture     Final   Value:        BLOOD CULTURE RECEIVED NO GROWTH TO DATE CULTURE WILL BE HELD FOR 5  DAYS BEFORE ISSUING A FINAL NEGATIVE REPORT     Performed at [facility]   Report Status PENDING   Incomplete  CULTURE, BLOOD (ROUTINE X 2)     Status: None   Collection Time    09/12/13  2:45 AM      Result Value Range Status   Specimen Description BLOOD LEFT HAND   Final   Special Requests BOTTLES DRAWN AEROBIC ONLY 1.5CC   Final   Culture  Setup Time     Final   Value: 09/12/2013 08:58     Performed at [facility]   Culture     Final   Value:        BLOOD CULTURE RECEIVED NO GROWTH TO DATE CULTURE WILL BE HELD FOR 5 DAYS BEFORE ISSUING A FINAL NEGATIVE REPORT     Performed at [facility]   Report Status PENDING   Incomplete     Studies: Dg Chest Port 1 View  09/12/2013   CLINICAL DATA:  Fever.  Sickle cell patient.  EXAM: PORTABLE CHEST - 1 VIEW  COMPARISON:  09/04/2013  FINDINGS: Shallow inspiration. Normal heart size and pulmonary vascularity. Central interstitial changes similar to previous study. No focal airspace disease or consolidation. No blunting of costophrenic angles. No pneumothorax. Stable appearance of right central venous catheter. Postoperative changes in the left shoulder. Sclerosis in the right humeral head may represent bone infarct. Go  IMPRESSION: No active disease.   Electronically Signed   By: Burman Nieves M.D.   On: 09/12/2013 01:30    Scheduled Meds: . enoxaparin (LOVENOX) injection  40 mg Subcutaneous Q24H  . feeding supplement (ENSURE COMPLETE)  237 mL Oral BID PC  . folic acid  1 mg Oral q morning - 10a  . HYDROmorphone PCA 0.3 mg/mL   Intravenous Q4H  . hydroxyurea  500 mg Oral TID  . morphine  60 mg Oral BID  . multivitamin with minerals  1 tablet Oral Daily  . nutrition supplement (JUVEN)  1 packet Oral Q24H  . pantoprazole  40 mg Oral Daily  . polyethylene glycol  17 g Oral BID  . senna-docusate  1 tablet Oral BID   Continuous Infusions: . sodium chloride 100 mL/hr at 09/13/13 1520

## 2013-09-14 DIAGNOSIS — R509 Fever, unspecified: Secondary | ICD-10-CM

## 2013-09-14 DIAGNOSIS — R609 Edema, unspecified: Secondary | ICD-10-CM

## 2013-09-14 LAB — PREPARE RBC (CROSSMATCH)

## 2013-09-14 MED ORDER — METHYLPREDNISOLONE SODIUM SUCC 125 MG IJ SOLR
125.0000 mg | Freq: Once | INTRAMUSCULAR | Status: AC
  Administered 2013-09-14: 15:00:00 125 mg via INTRAVENOUS
  Filled 2013-09-14 (×2): qty 2

## 2013-09-14 MED ORDER — HYDROXYZINE HCL 10 MG PO TABS
10.0000 mg | ORAL_TABLET | Freq: Once | ORAL | Status: DC
  Filled 2013-09-14: qty 1

## 2013-09-14 MED ORDER — SODIUM CHLORIDE 0.45 % IV SOLN
INTRAVENOUS | Status: DC
  Administered 2013-09-14: 22:00:00 via INTRAVENOUS

## 2013-09-14 MED ORDER — DIPHENHYDRAMINE HCL 50 MG/ML IJ SOLN
25.0000 mg | Freq: Once | INTRAMUSCULAR | Status: AC
  Administered 2013-09-14: 15:00:00 25 mg via INTRAVENOUS
  Filled 2013-09-14: qty 1

## 2013-09-14 MED ORDER — ACETAMINOPHEN 325 MG PO TABS: 650.0000 mg | ORAL_TABLET | Freq: Four times a day (QID) | ORAL | Status: DC | PRN

## 2013-09-14 NOTE — Progress Notes (Signed)
[facility] PROGRESS NOTE  Dennis Bernard [id number]:[id number] DOB: 04-22-1986 DOA: 09/04/2013 PCP: No primary provider on file.  Brief narrative: 28 y.o. male with a past medical history of sickle cell disease, frequent admissions for sickle cell pain crisis who was admitted 09/04/2013 to [facility] ED for generalized pain related to sickle cell crisis. Appreciate Dr. Azucena Freed recommendations. 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 We will however proceed with 1 unit PRBC transfusion and see if patient's pain improves.   Assessment/Plan:   Principal Problem:  Fever  - unclear etiology, UA negative, Tmax 101.8 F in past 24 hours - CXR did not show acute cardiopulmonary findings  - not on antibiotics  - blood cultures are negative to date  - continue to monitor fever curve  - influenza panel negative  Active Problems:  Sickle cell vaso-occlusive pain crisis  - Pain management: Continue long acting medications at usual home dose: MS Contin 60 mg by mouth every 12 hours. Avoid increasing dose further to prevent further issues with tolerance.  - continue PCA with dilaudid  - Adjuvant pain therapy: None.  - Wean IV narcotics when pain rated 7/10. Today the patient's pain is a level 8/10, (when weaning, decrease dose first, then frequency).  - When able to tolerate oral therapy, convert at 50-75% of IV dose & give PRNs Q 2-3 hours.  - Monitor CBC (add differential if on Hydrea); pt is on hydrea 500 mg TID - Monitor bilirubin/LDH Q 72 hours to monitor hemolysis. LDH 417 on 08/20/2013 - Last reticulocyte count was 21 - Ferritin level last checked 08/20/2013, and was 3768. Not on Desferal, not getting as frequent blood transfusions. - Euvolemic: Disontinue IV fluids  - K pad PRN.  - Continue folic acid.  Sickle cell anemia  - hemoglobin is 4.9 so will give 1 unit PRBC as discussed with hematology Chest tightness  - the 12 lead EKG did not show  acute ischemic changes. CXR with no acute cardiopulmonary findings  - cardiac enzymes negative X 3  Fracture of the phalanx of left ring finger  - Can follow with Dr. Amedeo Plenty as OP.     Code Status: full code  Family Communication: no family at the bedside  Disposition Plan: home when stable  Consultants:  Oncology (Dr. Murriel Hopper) Procedures:  None    Leisa Lenz, MD  [facility] Pager [phone]  If 7PM-7AM, please contact night-coverage [email or web address] Password [email or web address] 09/14/2013, 10:36 AM   LOS: 10 days    HPI/Subjective: Same pain.   Objective: Filed Vitals:   09/14/13 0205 09/14/13 0338 09/14/13 0510 09/14/13 0959  BP: 104/63  101/61 91/47  Pulse: 99  102 96  Temp: 98.5 F (36.9 C)  98.2 F (36.8 C) 97.1 F (36.2 C)  TempSrc: Oral  Oral Oral  Resp: 20 16 16 16   Height:      Weight: 82.373 kg (181 lb 9.6 oz)     SpO2: 99% 100% 95% 98%    Intake/Output Summary (Last 24 hours) at 09/14/13 1036 Last data filed at 09/14/13 1000  Gross per 24 hour  Intake   3382 ml  Output   2795 ml  Net    587 ml    Exam:   General:  Pt is alert, follows commands appropriately, not in acute distress  Cardiovascular: Regular rate and rhythm, S1/S2, no murmurs, no rubs, no gallops  Respiratory: Clear to auscultation bilaterally, no wheezing, no crackles, no  rhonchi  Abdomen: Soft, non tender, non distended, bowel sounds present, no guarding  Extremities: No edema, pulses DP and PT palpable bilaterally  Neuro: Grossly nonfocal  Data Reviewed: Basic Metabolic Panel:  Recent Labs Lab 09/12/13 0125  NA 142  K 3.9  CL 108  CO2 22  GLUCOSE 108*  BUN 13  CREATININE 0.86  CALCIUM 8.9   Liver Function Tests:  Recent Labs Lab 09/12/13 0125  AST 52*  ALT 27  ALKPHOS 109  BILITOT 2.3*  PROT 7.2  ALBUMIN 3.6   No results found for this basename: LIPASE, AMYLASE,  in the last 168 hours No results found for this basename: AMMONIA,  in the last  168 hours CBC:  Recent Labs Lab 09/10/13 0425 09/12/13 0125  WBC  --  19.3*  NEUTROABS  --  14.3*  HGB 5.5* 4.9*  HCT 16.3* 14.3*  MCV  --  91.1  PLT  --  336   Cardiac Enzymes: No results found for this basename: CKTOTAL, CKMB, CKMBINDEX, TROPONINI,  in the last 168 hours BNP: No components found with this basename: POCBNP,  CBG: No results found for this basename: GLUCAP,  in the last 168 hours  Recent Results (from the past 240 hour(s))  CULTURE, BLOOD (ROUTINE X 2)     Status: None   Collection Time    09/12/13  2:40 AM      Result Value Range Status   Specimen Description BLOOD LEFT ANTECUBITAL   Final   Special Requests BOTTLES DRAWN AEROBIC ONLY 1CC   Final   Culture  Setup Time     Final   Value: 09/12/2013 08:58     Performed at [facility]   Culture     Final   Value:        BLOOD CULTURE RECEIVED NO GROWTH TO DATE CULTURE WILL BE HELD FOR 5 DAYS BEFORE ISSUING A FINAL NEGATIVE REPORT     Performed at [facility]   Report Status PENDING   Incomplete  CULTURE, BLOOD (ROUTINE X 2)     Status: None   Collection Time    09/12/13  2:45 AM      Result Value Range Status   Specimen Description BLOOD LEFT HAND   Final   Special Requests BOTTLES DRAWN AEROBIC ONLY 1.5CC   Final   Culture  Setup Time     Final   Value: 09/12/2013 08:58     Performed at [facility]   Culture     Final   Value:        BLOOD CULTURE RECEIVED NO GROWTH TO DATE CULTURE WILL BE HELD FOR 5 DAYS BEFORE ISSUING A FINAL NEGATIVE REPORT     Performed at [facility]   Report Status PENDING   Incomplete     Studies: No results found.  Scheduled Meds: . enoxaparin (LOVENOX) injection  40 mg Subcutaneous Q24H  . feeding supplement (ENSURE COMPLETE)  237 mL Oral BID PC  . folic acid  1 mg Oral q morning - 10a  . HYDROmorphone PCA 0.3 mg/mL   Intravenous Q4H  . hydroxyurea  500 mg Oral TID  . morphine  60 mg Oral BID  . multivitamin with minerals  1  tablet Oral Daily  . nutrition supplement (JUVEN)  1 packet Oral Q24H  . pantoprazole  40 mg Oral Daily  . polyethylene glycol  17 g Oral BID  . senna-docusate  1 tablet Oral BID  Continuous Infusions:

## 2013-09-14 NOTE — Progress Notes (Signed)
Minimal progress but feels a little better today Rales right lung base - encouraged to get OOB Now w 2+ edema of extremities from Saline Hydration and low Hemoglobin Hydrea resumed @ 500 mg PO TID Discussed blood transfusion with patient and Hospital Attending - We will go ahead and transfuse 2 units of least incompatible blood with usual pre-meds in attempt to abort crisis. Start to taper IV narcotics

## 2013-09-15 ENCOUNTER — Telehealth: Payer: Self-pay | Admitting: Oncology

## 2013-09-15 LAB — TYPE AND SCREEN
ABO/RH(D): AB POS
Antibody Screen: POSITIVE
DAT, IgG: POSITIVE
Unit division: 0

## 2013-09-15 LAB — CBC WITH DIFFERENTIAL/PLATELET
BASOS PCT: 0 % (ref 0–1)
Basophils Absolute: 0 10*3/uL (ref 0.0–0.1)
EOS ABS: 0 10*3/uL (ref 0.0–0.7)
EOS PCT: 0 % (ref 0–5)
HEMATOCRIT: 14.3 % — AB (ref 39.0–52.0)
HEMOGLOBIN: 5 g/dL — AB (ref 13.0–17.0)
LYMPHS PCT: 18 % (ref 12–46)
Lymphs Abs: 2.8 10*3/uL (ref 0.7–4.0)
MCH: 31.4 pg (ref 26.0–34.0)
MCHC: 35 g/dL (ref 30.0–36.0)
MCV: 89.9 fL (ref 78.0–100.0)
Monocytes Absolute: 1.6 10*3/uL — ABNORMAL HIGH (ref 0.1–1.0)
Monocytes Relative: 10 % (ref 3–12)
Neutro Abs: 11.1 10*3/uL — ABNORMAL HIGH (ref 1.7–7.7)
Neutrophils Relative %: 72 % (ref 43–77)
Platelets: 361 10*3/uL (ref 150–400)
RBC: 1.59 MIL/uL — ABNORMAL LOW (ref 4.22–5.81)
RDW: 18.7 % — ABNORMAL HIGH (ref 11.5–15.5)
WBC: 15.5 10*3/uL — ABNORMAL HIGH (ref 4.0–10.5)

## 2013-09-15 LAB — COMPREHENSIVE METABOLIC PANEL
ALT: 25 U/L (ref 0–53)
AST: 26 U/L (ref 0–37)
Albumin: 3.5 g/dL (ref 3.5–5.2)
Alkaline Phosphatase: 138 U/L — ABNORMAL HIGH (ref 39–117)
BILIRUBIN TOTAL: 2.6 mg/dL — AB (ref 0.3–1.2)
BUN: 15 mg/dL (ref 6–23)
CHLORIDE: 103 meq/L (ref 96–112)
CO2: 23 mEq/L (ref 19–32)
Calcium: 9.1 mg/dL (ref 8.4–10.5)
Creatinine, Ser: 0.72 mg/dL (ref 0.50–1.35)
GFR calc Af Amer: >90 mL/min (ref >90)
GFR calc non Af Amer: >90 mL/min (ref >90)
Glucose, Bld: 125 mg/dL — ABNORMAL HIGH (ref 70–99)
POTASSIUM: 4.3 meq/L (ref 3.7–5.3)
SODIUM: 137 meq/L (ref 137–147)
Total Protein: 6.9 g/dL (ref 6.0–8.3)

## 2013-09-15 LAB — RETICULOCYTES
RBC.: 1.59 MIL/uL — AB (ref 4.22–5.81)
Retic Count, Absolute: 165.4 10*3/uL (ref 19.0–186.0)
Retic Ct Pct: 10.4 % — ABNORMAL HIGH (ref 0.4–3.1)

## 2013-09-15 LAB — LACTATE DEHYDROGENASE: LDH: 351 U/L — AB (ref 94–250)

## 2013-09-15 MED ORDER — HEPARIN SOD (PORK) LOCK FLUSH 100 UNIT/ML IV SOLN
500.0000 [IU] | INTRAVENOUS | Status: AC | PRN
  Administered 2013-09-15: 500 [IU]

## 2013-09-15 MED ORDER — ADULT MULTIVITAMIN W/MINERALS CH: 1.0000 | ORAL_TABLET | Freq: Every day | ORAL | Status: AC

## 2013-09-15 MED ORDER — MORPHINE SULFATE ER 60 MG PO TBCR: 60.0000 mg | EXTENDED_RELEASE_TABLET | Freq: Two times a day (BID) | ORAL | Status: DC

## 2013-09-15 MED ORDER — HYDROXYUREA 500 MG PO CAPS: 500.0000 mg | ORAL_CAPSULE | Freq: Three times a day (TID) | ORAL | Status: DC

## 2013-09-15 MED ORDER — HYDROMORPHONE HCL 4 MG PO TABS: 4.0000 mg | ORAL_TABLET | ORAL | Status: DC | PRN

## 2013-09-15 MED ORDER — PROMETHAZINE HCL 25 MG PO TABS: 25.0000 mg | ORAL_TABLET | Freq: Four times a day (QID) | ORAL | Status: AC | PRN

## 2013-09-15 NOTE — Progress Notes (Signed)
He received 1 unit of least incompatible RBCs on 1/1 with no acute toxicity Crisis pain slowly subsiding Decreased breath sounds right lung base  effusion? Decreased peripheral edema w decrease in IV fluids I will check follow up CBC, retic today Taper IV narcotics per Hospitalist

## 2013-09-15 NOTE — Progress Notes (Signed)
Pt was given 50mg  Bendryl IV, went into pt room at 0040 to find that the pt increased the rate to 999. When asked about it, pt said he was trying to silence the pump because he had to go to the bathroom. Another RN witnessed and heard pt manipulate the IV pump. Reminded pt not to manipulate the pump and to ask RN for assistance. Will continue to monitor.

## 2013-09-15 NOTE — Discharge Instructions (Signed)
Sickle Cell Anemia Sickle cell anemia needs regular medical care by your caregiver and awareness by you when to seek medical care. Pain is a common problem in children with sickle cell disease. This usually starts at less than 30 year of age. Pain can occur nearly anywhere in the body, but most commonly happens in the extremities, back, chest, or belly (abdomen). Pain episodes can start suddenly or may follow an illness. These attacks can appear as decreased activity, loss of appetite, change in behavior, or simply complaints of pain. DIAGNOSIS   Specialized blood and gene testing can help make this diagnosis early in the disease. Blood tests may then be done to watch blood levels.  Specialized brain scans are done when there are problems in the brain during a crisis.  Lung testing may be done later in the disease. HOME CARE INSTRUCTIONS   Maintain good hydration. Increase your child's fluid intake in hot weather and during exercise.  Avoid smoking around your child. Smoking lowers the oxygen in the blood and can cause sickling.  Control pain. Only give your child over-the-counter or prescription medicines for pain, discomfort, or fever as directed by their caregiver. Do not give aspirin to children because of the association with Reye's syndrome.  Keep regular health care checks to keep a proper red blood cell (hemoglobin) level. A moderate anemia level protects against sickling crises.  You or your child should receive all the same immunizations and care as the people around them.  Moms should breastfeed their babies if possible. Use formulas with iron added if breastfeeding is not possible. Additional iron should not be given unless there is a lack of it. People with SCD build up iron faster than normal. Give folic acid and additional vitamins as directed.  If you or your child has been prescribed antibiotics or other medications to prevent problems, give them as directed.  Summer camps  are available for children with SCD. They may help young people deal with their disease. The camps introduce them to other children with the same condition.  Young people with SCD may become frustrated or angry at their disease. This can cause rebellion and refusal to follow medical care. Help groups or counseling may help with these problems.  Make sure your child wears a medical alert bracelet. When traveling, keep your medical information, caregiver's names, and the medications your child takes with you at all times. SEEK IMMEDIATE MEDICAL CARE IF:   You or your child feels dizzy or faint.  You or your child develops a new onset of abdominal pain, especially on the left side near the stomach area.  You or your child develops a persistent, often uncomfortable and painful penile erection. This is called priapism. Always check young boys for this. It is often embarrassing for them and they may not bring it to your attention. This is a medical emergency and needs immediate treatment. If this is not treated it will lead to impotence.  You or your child develops numbness in or has a hard time moving arms and legs.  You or your child has a hard time with speech.  You have a fever.  You or your child develops signs of infection (chills, lethargy, irritability, poor eating, vomiting). The younger the child, the more you should be concerned.  With fevers, do not give medications to lower the fever right away. This could cover up a problem that is developing. Notify your caregiver.  You or your child develops pain that is  not helped with medicine. °· You or your child develops shortness of breath, or is coughing up pus-like or bloody sputum. °· You or your child develops any problems that are new and are causing you to worry. °Document Released: 12/09/2005 Document Revised: 11/23/2011 Document Reviewed: 04/12/2013 °Exit[employer]® Patient Information ©2014 [employer]. ° °

## 2013-09-15 NOTE — Telephone Encounter (Signed)
SPOKE WITH DR. Charlies Silvers AND [facility] F/U APPT 01/14 @ 10:30 W/DR. GRANFORTUNA.  CALENDAR MAILED.

## 2013-09-15 NOTE — Progress Notes (Signed)
Porta cath deaccessed. Flushed with 10cc NS followed by Heparin 5ml (100u/ml). No bleeding to site, band aid to site for comfort. Fermon Ureta M 

## 2013-09-15 NOTE — Discharge Summary (Signed)
Physician Discharge Summary  Dennis Bernard JKD:[id number] DOB: Apr 07, 1986 DOA: 09/04/2013  PCP: No primary provider on file.  Admit date: 09/04/2013 Discharge date: 09/15/2013  Recommendations for Outpatient Follow-up:  Follow up with PCP in 1 week after discharge to make sure symptoms are controlled 2, Pt has received 1 unit PRBC on this admission for hemoglobin of 4.9; recheck CBC during next PCP appt   Discharge Diagnoses:  Active Problems:   Sickle cell pain crisis   Sickle cell anemia   Chronic pain   Fracture of phalanx of left ring finger   Sickle cell crisis   Chest tightness    Discharge Condition: medically stable for discharge home today   Diet recommendation: as tolerated   History of present illness:  28 y.o. male with a past medical history of sickle cell disease, frequent admissions for sickle cell pain crisis who was admitted 09/04/2013 to [facility] ED for generalized pain related to sickle cell crisis. Appreciate Dr. Azucena Freed recommendations. 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 We did however proceed with 1 unit PRBC transfusion while in hospital which he tolerated well with premeds.   Assessment/Plan:   Principal Problem:  Fever  - unclear etiology, UA negative, Tmax 101.8 F in past 24 hours  - CXR did not show acute cardiopulmonary findings  - not on antibiotics  - blood cultures are negative to date  - influenza panel negative  Active Problems:  Sickle cell vaso-occlusive pain crisis  - Pain management: Continued long acting medications at usual home dose: MS Contin 60 mg by mouth every 12 hours. Avoided increasing dose further to prevent further issues with tolerance.  - was on PCA with dilaudid  - Adjuvant pain therapy: None.  - Weaned IV narcotics when pain rated 7/10. Today the patient's pain is a level 7/10 - able to tolerate oral therapy, we converted to PO  - on hydrea 500 mg TID  - LDH 417  on 08/20/2013  - Last reticulocyte count was 21  - Ferritin level last checked 08/20/2013, and was 3768. Not on Desferal, not getting as frequent blood transfusions.  - Euvolemic: Disontinued IV fluids  - K pad PRN.  - Continue folic acid.  Sickle cell anemia  - hemoglobin was 4.9 so we gave 1 unit PRBC as discussed with hematology  Chest tightness  - the 12 lead EKG did not show acute ischemic changes. CXR with no acute cardiopulmonary findings  - cardiac enzymes negative X 3  Fracture of the phalanx of left ring finger  - Can follow with Dr. Amedeo Plenty as OP.    Code Status: full code  Family Communication: no family at the bedside   Consultants:  Oncology (Dr. Murriel Hopper) Procedures:  None    Signed:  Leisa Lenz, MD  [facility] 09/15/2013, 11:48 AM  Pager #: [phone]   Discharge Exam: Filed Vitals:   09/15/13 1000  BP: 109/74  Pulse: 78  Temp: 97.5 F (36.4 C)  Resp: 16   Filed Vitals:   09/15/13 0024 09/15/13 0401 09/15/13 0426 09/15/13 1000  BP:   118/75 109/74  Pulse:   78 78  Temp:   97.8 F (36.6 C) 97.5 F (36.4 C)  TempSrc:   Oral Oral  Resp: 23 20 15 16   Height:      Weight:      SpO2:   100% 100%    General: Pt is alert, follows commands appropriately, not  in acute distress Cardiovascular: Regular rate and rhythm, S1/S2 +, no murmurs, no rubs, no gallops Respiratory: Clear to auscultation bilaterally, no wheezing, no crackles, no rhonchi Abdominal: Soft, non tender, non distended, bowel sounds +, no guarding Extremities: no edema, no cyanosis, pulses palpable bilaterally DP and PT Neuro: Grossly nonfocal  Discharge Instructions  Discharge Orders   Future Orders Complete By Expires   Call MD for:  difficulty breathing, headache or visual disturbances  As directed    Call MD for:  persistant dizziness or light-headedness  As directed    Call MD for:  persistant nausea and vomiting  As directed    Call MD for:  severe  uncontrolled pain  As directed    Diet - low sodium heart healthy  As directed    Discharge instructions  As directed    Comments:     1. May follow with Dr. Beryle Beams per scheduled appointment   Increase activity slowly  As directed        Medication List         albuterol 108 (90 BASE) MCG/ACT inhaler  Commonly known as:  PROVENTIL HFA;VENTOLIN HFA  Inhale 1 puff into the lungs every 6 (six) hours as needed for wheezing or shortness of breath. wheezing     diphenhydrAMINE 25 MG tablet  Commonly known as:  BENADRYL  Take 1 tablet (25 mg total) by mouth every 8 (eight) hours as needed for itching.     fluticasone 50 MCG/ACT nasal spray  Commonly known as:  FLONASE  Place 1 spray into the nose daily.     folic acid 1 MG tablet  Commonly known as:  FOLVITE  Take 1 mg by mouth every morning.     HYDROmorphone 4 MG tablet  Commonly known as:  DILAUDID  Take 1 tablet (4 mg total) by mouth every 4 (four) hours as needed.     hydroxyurea 500 MG capsule  Commonly known as:  HYDREA  Take 1 capsule (500 mg total) by mouth 3 (three) times daily. May take with food to minimize GI side effects.     morphine 60 MG 12 hr tablet  Commonly known as:  MS CONTIN  Take 1 tablet (60 mg total) by mouth 2 (two) times daily.     multivitamin with minerals Tabs tablet  Take 1 tablet by mouth daily.     naproxen 500 MG tablet  Commonly known as:  NAPROSYN  Take 1 tablet (500 mg total) by mouth 2 (two) times daily with a meal.     omeprazole 40 MG capsule  Commonly known as:  PRILOSEC  Take 1 capsule (40 mg total) by mouth daily.     polyethylene glycol packet  Commonly known as:  MIRALAX / GLYCOLAX  Take 17 g by mouth 2 (two) times daily.     promethazine 25 MG tablet  Commonly known as:  PHENERGAN  Take 1 tablet (25 mg total) by mouth every 6 (six) hours as needed for nausea.     sennosides-docusate sodium 8.6-50 MG tablet  Commonly known as:  SENOKOT-S  Take 1 tablet by mouth 2  (two) times daily.          The results of significant diagnostics from this hospitalization (including imaging, microbiology, ancillary and laboratory) are listed below for reference.    Significant Diagnostic Studies: Dg Chest 2 View  09/04/2013   CLINICAL DATA:  Fever, sickle cell crisis.  EXAM: CHEST  2 VIEW  COMPARISON:  August 20, 2013.  FINDINGS: Interval placement of right-sided Port-A-Cath with distal tip in expected position of the SVC. Cardiomediastinal silhouette appears normal. Left shoulder arthroplasty is noted. No pneumothorax or pleural effusion is noted. Stable interstitial densities are noted throughout both lungs. No acute pulmonary disease is noted.  IMPRESSION: Stable interstitial densities compared to prior exam. No acute cardiopulmonary abnormality seen.   Electronically Signed   By: Sabino Dick M.D.   On: 09/04/2013 11:26   Dg Chest 2 View  08/20/2013   CLINICAL DATA:  Short of breath  EXAM: CHEST  2 VIEW  COMPARISON:  08/14/2013  FINDINGS: Mild cardiac enlargement. No pleural effusions identified. Lung volumes are low. Interstitial coarsening is identified bilaterally. This appears similar to previous exam. Bony changes of sickle cell disease is noted. Previous left shoulder arthroplasty.  IMPRESSION: 1. Similar appearance of chronic interstitial coarsening 2. No acute findings noted.   Electronically Signed   By: Kerby Moors M.D.   On: 08/20/2013 11:52   Dg Lumbar Spine 2-3 Views  08/23/2013   CLINICAL DATA:  Sickle cell crisis. Persistent lumbosacral pain over the last 2 weeks.  EXAM: LUMBAR SPINE - 2-3 VIEW  COMPARISON:  Lumbar spine radiographs 05/17/2013.  FINDINGS: Typical changes of sickle cell anemia are noted within the lumbosacral spine. Alignment is stable. The soft tissues are unremarkable. The femoral heads are not visualized today.  IMPRESSION: A  1. Stable chronic changes of sickle cell disease in the lumbosacral spine. 2. No acute abnormality or  significant interval change.   Electronically Signed   By: Lawrence Santiago M.D.   On: 08/23/2013 11:11   Dg Hand 2 View Left  08/20/2013   CLINICAL DATA:  Fracture of the left ring finger.  EXAM: LEFT HAND - 2 VIEW  COMPARISON:  08/13/2013  FINDINGS: The comminuted impacted fracture of the base of the middle phalangeal bone of the ring finger is again noted. Dorsal fragment is displaced more than the volar fragment. There has been no significant change.  IMPRESSION: No significant change in the appearance of the impacted comminuted displaced fracture of the middle phalanx of the left ring finger.   Electronically Signed   By: Rozetta Nunnery M.D.   On: 08/20/2013 15:54   Ir Fluoro Guide Cv Line Right  08/25/2013   CLINICAL DATA:  Sickle cell anemia and need for porta cath for long-term IV access needs.  EXAM: IMPLANTED PORT A CATH PLACEMENT WITH ULTRASOUND AND FLUOROSCOPIC GUIDANCE  ANESTHESIA/SEDATION: 4.0 Mg IV Versed; 200 mcg IV Fentanyl  Total Moderate Sedation Time:  45 minutes  Additional Medications: 2 g IV Ancef. As antibiotic prophylaxis, Ancef was ordered pre-procedure and administered intravenously within one hour of incision.  FLUOROSCOPY TIME:  1 min and 12 seconds.  PROCEDURE: The procedure, risks, benefits, and alternatives were explained to the patient. Questions regarding the procedure were encouraged and answered. The patient understands and consents to the procedure.  The right neck and chest were prepped with chlorhexidine in a sterile fashion, and a sterile drape was applied covering the operative field. Maximum barrier sterile technique with sterile gowns and gloves were used for the procedure. Local anesthesia was provided with 1% lidocaine.  After creating a small venotomy incision, a 21 gauge needle was advanced into the right jugular vein under direct, real-time ultrasound guidance. Ultrasound image documentation was performed. After securing guidewire access, an 8 Fr dilator was placed.  A J-wire was kinked to measure appropriate catheter length.  A subcutaneous  port pocket was then created along the upper chest wall utilizing sharp and blunt dissection. Portable cautery was utilized. The pocket was irrigated with sterile saline.  A single lumen power injectable port was chosen for placement. The 8 Fr catheter was tunneled from the port pocket site to the venotomy incision. The port was placed in the pocket. External catheter was trimmed to appropriate length based on guidewire measurement.  At the venotomy, an 8 Fr peel-away sheath was placed over a guidewire. The catheter was then placed through the sheath and the sheath removed. Final catheter positioning was confirmed and documented with a fluoroscopic spot image. The port was accessed with a needle and aspirated and flushed with heparinized saline. The needle was removed.  The venotomy and port pocket incisions were closed with subcutaneous 3-0 Monocryl and subcuticular 4-0 Vicryl. Dermabond was applied to both incisions.  Complications: None. No pneumothorax.  FINDINGS: Initial ultrasound shows a diminutive right internal jugular vein. After access, a guidewire could not be successfully advanced into the SVC. Venous access for poor placement was ultimately performed at the juncture of the external jugular vein and subclavian vein. After catheter placement, the tip lies at the cavoatrial junction. The catheter aspirates normally and is ready for immediate use.  IMPRESSION: Placement of single lumen port a cath on the right via access at the juncture of the external jugular and subclavian veins. The catheter tip lies at the cavoatrial junction. A power injectable port a cath was placed and is ready for immediate use.   Electronically Signed   By: Aletta Edouard M.D.   On: 08/25/2013 17:13   Ir US Guide Vasc Access Right  08/25/2013   CLINICAL DATA:  Sickle cell anemia and need for porta cath for long-term IV access needs.  EXAM: IMPLANTED  PORT A CATH PLACEMENT WITH ULTRASOUND AND FLUOROSCOPIC GUIDANCE  ANESTHESIA/SEDATION: 4.0 Mg IV Versed; 200 mcg IV Fentanyl  Total Moderate Sedation Time:  45 minutes  Additional Medications: 2 g IV Ancef. As antibiotic prophylaxis, Ancef was ordered pre-procedure and administered intravenously within one hour of incision.  FLUOROSCOPY TIME:  1 min and 12 seconds.  PROCEDURE: The procedure, risks, benefits, and alternatives were explained to the patient. Questions regarding the procedure were encouraged and answered. The patient understands and consents to the procedure.  The right neck and chest were prepped with chlorhexidine in a sterile fashion, and a sterile drape was applied covering the operative field. Maximum barrier sterile technique with sterile gowns and gloves were used for the procedure. Local anesthesia was provided with 1% lidocaine.  After creating a small venotomy incision, a 21 gauge needle was advanced into the right jugular vein under direct, real-time ultrasound guidance. Ultrasound image documentation was performed. After securing guidewire access, an 8 Fr dilator was placed. A J-wire was kinked to measure appropriate catheter length.  A subcutaneous port pocket was then created along the upper chest wall utilizing sharp and blunt dissection. Portable cautery was utilized. The pocket was irrigated with sterile saline.  A single lumen power injectable port was chosen for placement. The 8 Fr catheter was tunneled from the port pocket site to the venotomy incision. The port was placed in the pocket. External catheter was trimmed to appropriate length based on guidewire measurement.  At the venotomy, an 8 Fr peel-away sheath was placed over a guidewire. The catheter was then placed through the sheath and the sheath removed. Final catheter positioning was confirmed and documented with a fluoroscopic spot image.  The port was accessed with a needle and aspirated and flushed with heparinized saline.  The needle was removed.  The venotomy and port pocket incisions were closed with subcutaneous 3-0 Monocryl and subcuticular 4-0 Vicryl. Dermabond was applied to both incisions.  Complications: None. No pneumothorax.  FINDINGS: Initial ultrasound shows a diminutive right internal jugular vein. After access, a guidewire could not be successfully advanced into the SVC. Venous access for poor placement was ultimately performed at the juncture of the external jugular vein and subclavian vein. After catheter placement, the tip lies at the cavoatrial junction. The catheter aspirates normally and is ready for immediate use.  IMPRESSION: Placement of single lumen port a cath on the right via access at the juncture of the external jugular and subclavian veins. The catheter tip lies at the cavoatrial junction. A power injectable port a cath was placed and is ready for immediate use.   Electronically Signed   By: Aletta Edouard M.D.   On: 08/25/2013 17:13   Dg Chest Port 1 View  09/12/2013   CLINICAL DATA:  Fever.  Sickle cell patient.  EXAM: PORTABLE CHEST - 1 VIEW  COMPARISON:  09/04/2013  FINDINGS: Shallow inspiration. Normal heart size and pulmonary vascularity. Central interstitial changes similar to previous study. No focal airspace disease or consolidation. No blunting of costophrenic angles. No pneumothorax. Stable appearance of right central venous catheter. Postoperative changes in the left shoulder. Sclerosis in the right humeral head may represent bone infarct. Go  IMPRESSION: No active disease.   Electronically Signed   By: Lucienne Capers M.D.   On: 09/12/2013 01:30    Microbiology: Recent Results (from the past 240 hour(s))  CULTURE, BLOOD (ROUTINE X 2)     Status: None   Collection Time    09/12/13  2:40 AM      Result Value Range Status   Specimen Description BLOOD LEFT ANTECUBITAL   Final   Special Requests BOTTLES DRAWN AEROBIC ONLY 1CC   Final   Culture  Setup Time     Final   Value:  09/12/2013 08:58     Performed at [facility]   Culture     Final   Value:        BLOOD CULTURE RECEIVED NO GROWTH TO DATE CULTURE WILL BE HELD FOR 5 DAYS BEFORE ISSUING A FINAL NEGATIVE REPORT     Performed at [facility]   Report Status PENDING   Incomplete  CULTURE, BLOOD (ROUTINE X 2)     Status: None   Collection Time    09/12/13  2:45 AM      Result Value Range Status   Specimen Description BLOOD LEFT HAND   Final   Special Requests BOTTLES DRAWN AEROBIC ONLY 1.5CC   Final   Culture  Setup Time     Final   Value: 09/12/2013 08:58     Performed at [facility]   Culture     Final   Value:        BLOOD CULTURE RECEIVED NO GROWTH TO DATE CULTURE WILL BE HELD FOR 5 DAYS BEFORE ISSUING A FINAL NEGATIVE REPORT     Performed at [facility]   Report Status PENDING   Incomplete     Labs: Basic Metabolic Panel:  Recent Labs Lab 09/12/13 0125 09/15/13 0900  NA 142 137  K 3.9 4.3  CL 108 103  CO2 22 23  GLUCOSE 108* 125*  BUN 13 15  CREATININE 0.86 0.72  CALCIUM 8.9 9.1   Liver Function Tests:  Recent Labs Lab 09/12/13 0125 09/15/13 0900  AST 52* 26  ALT 27 25  ALKPHOS 109 138*  BILITOT 2.3* 2.6*  PROT 7.2 6.9  ALBUMIN 3.6 3.5   No results found for this basename: LIPASE, AMYLASE,  in the last 168 hours No results found for this basename: AMMONIA,  in the last 168 hours CBC:  Recent Labs Lab 09/10/13 0425 09/12/13 0125 09/15/13 0900  WBC  --  19.3* 15.5*  NEUTROABS  --  14.3* 11.1*  HGB 5.5* 4.9* 5.0*  HCT 16.3* 14.3* 14.3*  MCV  --  91.1 89.9  PLT  --  336 361   Cardiac Enzymes: No results found for this basename: CKTOTAL, CKMB, CKMBINDEX, TROPONINI,  in the last 168 hours BNP: BNP (last 3 results) No results found for this basename: PROBNP,  in the last 8760 hours CBG: No results found for this basename: GLUCAP,  in the last 168 hours  Time coordinating discharge: Over 30 minutes

## 2013-09-18 LAB — CULTURE, BLOOD (ROUTINE X 2)
Culture: NO GROWTH
Culture: NO GROWTH

## 2013-09-24 ENCOUNTER — Inpatient Hospital Stay (HOSPITAL_COMMUNITY)
Admission: EM | Admit: 2013-09-24 | Discharge: 2013-10-03 | DRG: 812 | Disposition: A | Discharge status: Home / Self Care | Payer: Medicare Other | Attending: Internal Medicine | Admitting: Internal Medicine

## 2013-09-24 ENCOUNTER — Encounter (HOSPITAL_COMMUNITY): Payer: Self-pay | Admitting: Emergency Medicine

## 2013-09-24 ENCOUNTER — Emergency Department (HOSPITAL_COMMUNITY): Payer: Medicare Other

## 2013-09-24 DIAGNOSIS — G8929 Other chronic pain: Secondary | ICD-10-CM | POA: Diagnosis present

## 2013-09-24 DIAGNOSIS — R5383 Other fatigue: Secondary | ICD-10-CM

## 2013-09-24 DIAGNOSIS — Z5189 Encounter for other specified aftercare: Secondary | ICD-10-CM

## 2013-09-24 DIAGNOSIS — D571 Sickle-cell disease without crisis: Secondary | ICD-10-CM | POA: Diagnosis present

## 2013-09-24 DIAGNOSIS — J309 Allergic rhinitis, unspecified: Secondary | ICD-10-CM | POA: Diagnosis present

## 2013-09-24 DIAGNOSIS — Z87898 Personal history of other specified conditions: Secondary | ICD-10-CM

## 2013-09-24 DIAGNOSIS — M87059 Idiopathic aseptic necrosis of unspecified femur: Secondary | ICD-10-CM | POA: Diagnosis present

## 2013-09-24 DIAGNOSIS — T80A0XA Non-ABO incompatibility reaction due to transfusion of blood or blood products, unspecified, initial encounter: Secondary | ICD-10-CM

## 2013-09-24 DIAGNOSIS — G894 Chronic pain syndrome: Secondary | ICD-10-CM | POA: Diagnosis present

## 2013-09-24 DIAGNOSIS — Z79899 Other long term (current) drug therapy: Secondary | ICD-10-CM

## 2013-09-24 DIAGNOSIS — F1120 Opioid dependence, uncomplicated: Secondary | ICD-10-CM | POA: Diagnosis present

## 2013-09-24 DIAGNOSIS — M549 Dorsalgia, unspecified: Secondary | ICD-10-CM

## 2013-09-24 DIAGNOSIS — Z832 Family history of diseases of the blood and blood-forming organs and certain disorders involving the immune mechanism: Secondary | ICD-10-CM

## 2013-09-24 DIAGNOSIS — Z96619 Presence of unspecified artificial shoulder joint: Secondary | ICD-10-CM

## 2013-09-24 DIAGNOSIS — Z9114 Patient's other noncompliance with medication regimen: Secondary | ICD-10-CM

## 2013-09-24 DIAGNOSIS — F1920 Other psychoactive substance dependence, uncomplicated: Secondary | ICD-10-CM | POA: Diagnosis present

## 2013-09-24 DIAGNOSIS — Z833 Family history of diabetes mellitus: Secondary | ICD-10-CM

## 2013-09-24 DIAGNOSIS — T80A0XD Non-ABO incompatibility reaction due to transfusion of blood or blood products, unspecified, subsequent encounter: Secondary | ICD-10-CM

## 2013-09-24 DIAGNOSIS — R5381 Other malaise: Secondary | ICD-10-CM

## 2013-09-24 DIAGNOSIS — D72829 Elevated white blood cell count, unspecified: Secondary | ICD-10-CM | POA: Diagnosis present

## 2013-09-24 DIAGNOSIS — D5700 Hb-SS disease with crisis, unspecified: Principal | ICD-10-CM | POA: Diagnosis present

## 2013-09-24 DIAGNOSIS — R0789 Other chest pain: Secondary | ICD-10-CM

## 2013-09-24 DIAGNOSIS — F1191 Opioid use, unspecified, in remission: Secondary | ICD-10-CM

## 2013-09-24 LAB — BASIC METABOLIC PANEL
BUN: 11 mg/dL (ref 6–23)
CO2: 23 meq/L (ref 19–32)
CREATININE: 0.82 mg/dL (ref 0.50–1.35)
Calcium: 9.2 mg/dL (ref 8.4–10.5)
Chloride: 106 mEq/L (ref 96–112)
GFR calc Af Amer: >90 mL/min (ref >90)
GFR calc non Af Amer: >90 mL/min (ref >90)
Glucose, Bld: 79 mg/dL (ref 70–99)
Potassium: 4.1 mEq/L (ref 3.7–5.3)
Sodium: 140 mEq/L (ref 137–147)

## 2013-09-24 LAB — URINALYSIS, ROUTINE W REFLEX MICROSCOPIC
Bilirubin Urine: NEGATIVE
GLUCOSE, UA: NEGATIVE mg/dL
Hgb urine dipstick: NEGATIVE
KETONES UR: NEGATIVE mg/dL
LEUKOCYTES UA: NEGATIVE
Nitrite: NEGATIVE
PROTEIN: NEGATIVE mg/dL
Specific Gravity, Urine: 1.018 (ref 1.005–1.030)
UROBILINOGEN UA: 0.2 mg/dL (ref 0.0–1.0)
pH: 5.5 (ref 5.0–8.0)

## 2013-09-24 LAB — RETICULOCYTES
RBC.: 1.97 MIL/uL — ABNORMAL LOW (ref 4.22–5.81)
Retic Ct Pct: >23 % — ABNORMAL HIGH (ref 0.4–3.1)

## 2013-09-24 LAB — CBC WITH DIFFERENTIAL/PLATELET
BAND NEUTROPHILS: 0 % (ref 0–10)
BASOS ABS: 0 10*3/uL (ref 0.0–0.1)
BLASTS: 0 %
Basophils Relative: 0 % (ref 0–1)
Eosinophils Absolute: 0.1 10*3/uL (ref 0.0–0.7)
Eosinophils Relative: 1 % (ref 0–5)
HEMATOCRIT: 19.9 % — AB (ref 39.0–52.0)
HEMOGLOBIN: 6.7 g/dL — AB (ref 13.0–17.0)
LYMPHS ABS: 4.4 10*3/uL — AB (ref 0.7–4.0)
LYMPHS PCT: 31 % (ref 12–46)
MCH: 34.7 pg — ABNORMAL HIGH (ref 26.0–34.0)
MCHC: 33.7 g/dL (ref 30.0–36.0)
MCV: 103.1 fL — AB (ref 78.0–100.0)
MONOS PCT: 7 % (ref 3–12)
Metamyelocytes Relative: 0 %
Monocytes Absolute: 1 10*3/uL (ref 0.1–1.0)
Myelocytes: 0 %
Neutro Abs: 8.8 10*3/uL — ABNORMAL HIGH (ref 1.7–7.7)
Neutrophils Relative %: 61 % (ref 43–77)
Platelets: 405 10*3/uL — ABNORMAL HIGH (ref 150–400)
Promyelocytes Absolute: 0 %
RBC: 1.93 MIL/uL — ABNORMAL LOW (ref 4.22–5.81)
RDW: 20.1 % — ABNORMAL HIGH (ref 11.5–15.5)
WBC: 14.3 10*3/uL — AB (ref 4.0–10.5)
nRBC: 0 /100 WBC

## 2013-09-24 MED ORDER — SODIUM CHLORIDE 0.9 % IV SOLN
INTRAVENOUS | Status: AC
  Administered 2013-09-25 (×2): via INTRAVENOUS

## 2013-09-24 MED ORDER — ONDANSETRON HCL 4 MG/2ML IJ SOLN: 4.0000 mg | INTRAMUSCULAR | Status: DC | PRN

## 2013-09-24 MED ORDER — FOLIC ACID 1 MG PO TABS
1.0000 mg | ORAL_TABLET | Freq: Every morning | ORAL | Status: DC
  Administered 2013-09-25 – 2013-10-03 (×9): 1 mg via ORAL
  Filled 2013-09-24 (×9): qty 1

## 2013-09-24 MED ORDER — ALBUTEROL SULFATE (2.5 MG/3ML) 0.083% IN NEBU: 2.5000 mg | INHALATION_SOLUTION | Freq: Four times a day (QID) | RESPIRATORY_TRACT | Status: DC | PRN

## 2013-09-24 MED ORDER — POLYETHYLENE GLYCOL 3350 17 G PO PACK
17.0000 g | PACK | Freq: Two times a day (BID) | ORAL | Status: DC
  Administered 2013-09-25 – 2013-10-03 (×18): 17 g via ORAL
  Filled 2013-09-24 (×19): qty 1

## 2013-09-24 MED ORDER — KETOROLAC TROMETHAMINE 30 MG/ML IJ SOLN
30.0000 mg | Freq: Four times a day (QID) | INTRAMUSCULAR | Status: AC
  Administered 2013-09-25 – 2013-09-29 (×20): 30 mg via INTRAVENOUS
  Filled 2013-09-24 (×21): qty 1

## 2013-09-24 MED ORDER — HYDROMORPHONE HCL PF 2 MG/ML IJ SOLN
2.0000 mg | INTRAMUSCULAR | Status: DC | PRN
  Filled 2013-09-24: qty 1

## 2013-09-24 MED ORDER — HYDROXYUREA 500 MG PO CAPS
500.0000 mg | ORAL_CAPSULE | Freq: Three times a day (TID) | ORAL | Status: DC
  Administered 2013-09-25 – 2013-10-03 (×26): 500 mg via ORAL
  Filled 2013-09-24 (×30): qty 1

## 2013-09-24 MED ORDER — DIPHENHYDRAMINE HCL 50 MG/ML IJ SOLN
12.5000 mg | INTRAMUSCULAR | Status: AC | PRN
  Administered 2013-09-24 (×2): 12.5 mg via INTRAVENOUS
  Filled 2013-09-24 (×2): qty 1

## 2013-09-24 MED ORDER — HYDROMORPHONE HCL PF 1 MG/ML IJ SOLN
1.0000 mg | Freq: Once | INTRAMUSCULAR | Status: DC
  Filled 2013-09-24: qty 1

## 2013-09-24 MED ORDER — ONDANSETRON HCL 4 MG/2ML IJ SOLN
4.0000 mg | Freq: Once | INTRAMUSCULAR | Status: AC
  Administered 2013-09-24: 4 mg via INTRAVENOUS
  Filled 2013-09-24: qty 2

## 2013-09-24 MED ORDER — IOHEXOL 350 MG/ML SOLN
100.0000 mL | Freq: Once | INTRAVENOUS | Status: AC | PRN
  Administered 2013-09-24: 100 mL via INTRAVENOUS

## 2013-09-24 MED ORDER — DIPHENHYDRAMINE HCL 25 MG PO CAPS
25.0000 mg | ORAL_CAPSULE | Freq: Three times a day (TID) | ORAL | Status: DC | PRN
Start: 2013-09-24 — End: 2013-10-03
  Administered 2013-09-25 – 2013-10-02 (×3): 25 mg via ORAL
  Filled 2013-09-24 (×4): qty 1

## 2013-09-24 MED ORDER — HYDROMORPHONE HCL PF 1 MG/ML IJ SOLN
1.0000 mg | INTRAMUSCULAR | Status: AC | PRN
  Administered 2013-09-24 (×3): 1 mg via INTRAVENOUS
  Filled 2013-09-24 (×3): qty 1

## 2013-09-24 MED ORDER — FOLIC ACID 1 MG PO TABS: 1.0000 mg | ORAL_TABLET | Freq: Every day | ORAL | Status: DC

## 2013-09-24 MED ORDER — MORPHINE SULFATE ER 30 MG PO TBCR
60.0000 mg | EXTENDED_RELEASE_TABLET | Freq: Two times a day (BID) | ORAL | Status: DC
  Administered 2013-09-25 – 2013-10-03 (×18): 60 mg via ORAL
  Filled 2013-09-24 (×19): qty 2

## 2013-09-24 MED ORDER — ALBUTEROL SULFATE HFA 108 (90 BASE) MCG/ACT IN AERS: 1.0000 | INHALATION_SPRAY | Freq: Four times a day (QID) | RESPIRATORY_TRACT | Status: DC | PRN

## 2013-09-24 MED ORDER — SODIUM CHLORIDE 0.9 % IV BOLUS (SEPSIS)
1000.0000 mL | Freq: Once | INTRAVENOUS | Status: AC
  Administered 2013-09-24: 1000 mL via INTRAVENOUS

## 2013-09-24 MED ORDER — PROMETHAZINE HCL 25 MG PO TABS
25.0000 mg | ORAL_TABLET | Freq: Four times a day (QID) | ORAL | Status: DC | PRN
  Administered 2013-09-25: 25 mg via ORAL
  Filled 2013-09-24: qty 1

## 2013-09-24 MED ORDER — ONDANSETRON HCL 4 MG PO TABS: 4.0000 mg | ORAL_TABLET | ORAL | Status: DC | PRN

## 2013-09-24 MED ORDER — HYDROMORPHONE HCL PF 2 MG/ML IJ SOLN
2.0000 mg | Freq: Once | INTRAMUSCULAR | Status: AC
  Administered 2013-09-24: 2 mg via INTRAVENOUS
  Filled 2013-09-24: qty 1

## 2013-09-24 MED ORDER — ADULT MULTIVITAMIN W/MINERALS CH
1.0000 | ORAL_TABLET | Freq: Every day | ORAL | Status: DC
  Administered 2013-09-25 – 2013-10-03 (×9): 1 via ORAL
  Filled 2013-09-24 (×9): qty 1

## 2013-09-24 MED ORDER — ACETAMINOPHEN 500 MG PO TABS
1000.0000 mg | ORAL_TABLET | Freq: Once | ORAL | Status: AC
  Administered 2013-09-24: 1000 mg via ORAL
  Filled 2013-09-24: qty 2

## 2013-09-24 NOTE — ED Notes (Signed)
Bed: WA22 Expected date:  Expected time:  Means of arrival:  Comments: Hold for triage 2 

## 2013-09-24 NOTE — ED Notes (Signed)
Pt c/o sickle cell crisis w/ pain in back and legs.  States that he feels SOB on exertion.  Pain since 2 days ago.

## 2013-09-24 NOTE — H&P (Signed)
PCP:   FLEMING, RON D, MD   Chief Complaint:  pain  HPI: 28 yo male with 2 days of leg and back pain worsening despite him taking his regular ms contin along with his po dilaudid every 4 hours (much more than he usually does).  He came to ED for uncontrolled pain.  Reports low grade temp.  Wheezing occasionally, none now.  No nasal congestion.  No cough.  No nasal congestion.  No sore throat.  No sick contacts.  No n/v/d.  He has received several rounds of 1mg  dilaudid iv in ED with no help.  Also having some sob when he walks a lot.  Review of Systems:  Positive and negative as per HPI otherwise all other systems are negative  Past Medical History: Past Medical History  Diagnosis Date  . Sickle cell disease   . Avascular necrosis of femur head, right   . H/O allergic rhinitis   . H/O hypokalemia   . Chronic pain syndrome   . H/O wheezing     with colds  . 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 Cholecystectomy  1995  . Portacath placement Left 07/04/2012  . Exchange transfusion  02/2008    perioperatively for shoulder surgery  . 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 Portacath placement Right November 2014    Medications: Prior to Admission medications   Medication Sig Start Date End Date Taking? Authorizing Provider  albuterol (PROVENTIL HFA;VENTOLIN HFA) 108 (90 BASE) MCG/ACT inhaler Inhale 1 puff into the lungs every 6 (six) hours as needed for wheezing or shortness of breath. wheezing 11/27/12  Yes Barton Dubois, MD  diphenhydrAMINE (BENADRYL) 25 MG tablet Take 1 tablet (25 mg total) by mouth every 8 (eight) hours as needed for itching. 07/29/13  Yes Robbie Lis, MD  folic acid (FOLVITE) 1 MG tablet Take 1 mg by mouth every morning. 11/27/12  Yes Barton Dubois, MD  HYDROmorphone (DILAUDID) 4 MG tablet Take 1 tablet (4 mg total) by mouth every 4 (four) hours as needed. 09/15/13  Yes Robbie Lis, MD   hydroxyurea (HYDREA) 500 MG capsule Take 1 capsule (500 mg total) by mouth 3 (three) times daily. May take with food to minimize GI side effects. 09/15/13  Yes Robbie Lis, MD  morphine (MS CONTIN) 60 MG 12 hr tablet Take 1 tablet (60 mg total) by mouth 2 (two) times daily. 09/15/13  Yes Robbie Lis, MD  Multiple Vitamin (MULTIVITAMIN WITH MINERALS) TABS tablet Take 1 tablet by mouth daily. 09/15/13  Yes Robbie Lis, MD  polyethylene glycol [facility] Floria Raveling) packet Take 17 g by mouth 2 (two) times daily. 08/28/13  Yes Adeline Saralyn Pilar, MD  promethazine (PHENERGAN) 25 MG tablet Take 1 tablet (25 mg total) by mouth every 6 (six) hours as needed for nausea. 09/15/13  Yes Robbie Lis, MD  sennosides-docusate sodium (SENOKOT-S) 8.6-50 MG tablet Take 1 tablet by mouth 2 (two) times daily. 02/14/13  Yes Leana Gamer, MD    Allergies:  No Known Allergies  Social History:  reports that he has never smoked. He has never used smokeless tobacco. He reports that he does not drink alcohol or use illicit drugs.  Family History: Family History  Problem Relation Age of Onset  . Sickle cell anemia Brother   . Sickle cell trait Father   . Sickle cell trait Mother   . Diabetes Mellitus II Mother   .  Sickle cell anemia Paternal Uncle   . Asthma Neg Hx   . Allergic rhinitis Neg Hx     Physical Exam: Filed Vitals:   09/24/13 1658 09/24/13 1945  BP: 126/80 121/71  Pulse: 121 105  Temp: 99.5 F (37.5 C) 99.3 F (37.4 C)  TempSrc:  Oral  Resp: 17 18  SpO2: 98% 97%   General appearance: alert, cooperative and no distress Head: Normocephalic, without obvious abnormality, atraumatic Eyes: negative Nose: Nares normal. Septum midline. Mucosa normal. No drainage or sinus tenderness. Neck: no JVD and supple, symmetrical, trachea midline Lungs: clear to auscultation bilaterally Heart: reg rate, tachycardic, no m/r/g Abdomen: soft, non-tender; bowel sounds normal; no masses,  no  organomegaly Extremities: extremities normal, atraumatic, no cyanosis or edema Pulses: 2+ and symmetric Skin: Skin color, texture, turgor normal. No rashes or lesions Neurologic: Grossly normal    Labs on Admission:   Recent Labs  09/24/13 1820  NA 140  K 4.1  CL 106  CO2 23  GLUCOSE 79  BUN 11  CREATININE 0.82  CALCIUM 9.2    Recent Labs  09/24/13 1820  WBC 14.3*  NEUTROABS 8.8*  HGB 6.7*  HCT 19.9*  MCV 103.1*  PLT 405*    Recent Labs  09/24/13 1820  RETICCTPCT >23.0*    Radiological Exams on Admission: Dg Chest 2 View  09/24/2013   CLINICAL DATA:  Cough and sickle cell anemia  EXAM: CHEST  2 VIEW  COMPARISON:  09/12/2013  FINDINGS: Cardiac shadow is stable. A right-sided chest wall port is again seen and stable. Postsurgical changes in the left shoulder are noted. No focal infiltrate is seen. No acute bony abnormality is noted.  IMPRESSION: No acute abnormality seen.   Electronically Signed   By: Inez Catalina M.D.   On: 09/24/2013 18:41   Ct Angio Chest Pe W/cm &/or Wo Cm  09/24/2013   CLINICAL DATA:  Sickle cell crisis with chest pain and shortness of breath  EXAM: CT ANGIOGRAPHY CHEST WITH CONTRAST  TECHNIQUE: Multidetector CT imaging of the chest was performed using the standard protocol during bolus administration of intravenous contrast. Multiplanar CT image reconstructions including MIPs were obtained to evaluate the vascular anatomy.  CONTRAST:  127mL OMNIPAQUE IOHEXOL 350 MG/ML SOLN  COMPARISON:  Chest x-ray from earlier in the same day.  FINDINGS: The lungs are well aerated bilaterally without focal infiltrate or sizable effusion. The right-sided chest wall port is again seen. The thoracic aorta is within normal limits. The pulmonary artery shows no definitive filling defects. No hilar or mediastinal adenopathy is seen. 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  Scanning into the upper abdomen demonstrates chronic changes within the spleen consistent with the  given clinical history. The bony structures demonstrate a mottled appearance consistent with the underlying clinical history.  Review of the MIP images confirms the above findings.  IMPRESSION: No evidence of pulmonary emboli.  Chronic changes consistent with the underlying clinical disease process. No acute abnormality is noted.   Electronically Signed   By: Inez Catalina M.D.   On: 09/24/2013 20:18    Assessment/Plan  28 yo male with sickle cell pain crisis  Principal Problem:   Sickle cell pain crisis- threshold to transfuse is less than 5, please see heme/onc dr Beryle Beams notes about premedication regimen in case the  Need for transfusion.  Aggressive ivf.  Just given dilaudid 2 mg iv, proceed to dilaudid pca pump if needed.  Add toradol to regimen.  Repeat labs  in am.  Cont home ms contin regimen, hold dilaudid po.  Active Problems:   Sickle cell anemia   Chronic pain   Narcotic habituation, continuous   Non-ABO incompatible blood transfusion    Sumiye Hirth A 09/24/2013, 10:28 PM '

## 2013-09-24 NOTE — ED Provider Notes (Signed)
CSN: [id number]     Arrival date & time 09/24/13  1647 History   First MD Initiated Contact with Patient 09/24/13 1731     Chief Complaint  Patient presents with  . Sickle Cell Pain Crisis   (Consider location/radiation/quality/duration/timing/severity/associated sxs/prior Treatment) Patient is a 28 y.o. male presenting with sickle cell pain.  Sickle Cell Pain Crisis Location:  Back and lower extremity Severity:  Severe Onset quality:  Gradual Duration:  2 days Similar to previous crisis episodes: yes   Timing:  Constant Progression:  Worsening Chronicity:  Recurrent History of pulmonary emboli: no   Context comment:  Cough Relieved by:  Nothing Worsened by:  Movement Ineffective treatments: oxycontin, dilaudid. Associated symptoms: chest pain, cough (nonproductive), fever (100.6 at home) and shortness of breath (worse with exertion)   Associated symptoms: no congestion, no nausea and no vomiting     Past Medical History  Diagnosis Date  . Sickle cell disease   . Avascular necrosis of femur head, right   . H/O allergic rhinitis   . H/O hypokalemia   . Chronic pain syndrome   . H/O wheezing     with colds  . 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 Cholecystectomy  1995  . Portacath placement Left 07/04/2012  . Exchange transfusion  02/2008    perioperatively for shoulder surgery  . 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 Portacath placement Right November 2014   Family History  Problem Relation Age of Onset  . Sickle cell anemia Brother   . Sickle cell trait Father   . Sickle cell trait Mother   . Diabetes Mellitus II Mother   . Sickle cell anemia Paternal Uncle   . Asthma Neg Hx   . Allergic rhinitis Neg Hx    History  Substance Use Topics  . Smoking status: Never Smoker   . Smokeless tobacco: Never Used  . Alcohol Use: No    Review of Systems  Constitutional: Positive for fever (100.6  at home).  HENT: Negative for congestion.   Respiratory: Positive for cough (nonproductive) and shortness of breath (worse with exertion).   Cardiovascular: Positive for chest pain.  Gastrointestinal: Negative for nausea, vomiting, abdominal pain and diarrhea.  All other systems reviewed and are negative.    Allergies  Review of patient's allergies indicates no known allergies.  Home Medications   Current Outpatient Rx  Name  Route  Sig  Dispense  Refill  . albuterol (PROVENTIL HFA;VENTOLIN HFA) 108 (90 BASE) MCG/ACT inhaler   Inhalation   Inhale 1 puff into the lungs every 6 (six) hours as needed for wheezing or shortness of breath. wheezing   1 Inhaler   1   . diphenhydrAMINE (BENADRYL) 25 MG tablet   Oral   Take 1 tablet (25 mg total) by mouth every 8 (eight) hours as needed for itching.   60 tablet   0   . folic acid (FOLVITE) 1 MG tablet   Oral   Take 1 mg by mouth every morning.         Marland Kitchen HYDROmorphone (DILAUDID) 4 MG tablet   Oral   Take 1 tablet (4 mg total) by mouth every 4 (four) hours as needed.   45 tablet   0   . hydroxyurea (HYDREA) 500 MG capsule   Oral   Take 1 capsule (500 mg total) by mouth 3 (three) times daily. May take with food to minimize GI side effects.  90 capsule   0   . morphine (MS CONTIN) 60 MG 12 hr tablet   Oral   Take 1 tablet (60 mg total) by mouth 2 (two) times daily.   60 tablet   0   . Multiple Vitamin (MULTIVITAMIN WITH MINERALS) TABS tablet   Oral   Take 1 tablet by mouth daily.   30 tablet   0   . polyethylene glycol (MIRALAX / GLYCOLAX) packet   Oral   Take 17 g by mouth 2 (two) times daily.   30 each   0   . promethazine (PHENERGAN) 25 MG tablet   Oral   Take 1 tablet (25 mg total) by mouth every 6 (six) hours as needed for nausea.   60 tablet   0   . sennosides-docusate sodium (SENOKOT-S) 8.6-50 MG tablet   Oral   Take 1 tablet by mouth 2 (two) times daily.          BP 126/80  Pulse 121   Temp(Src) 99.5 F (37.5 C)  Resp 17  SpO2 98% Physical Exam  Nursing note and vitals reviewed. Constitutional: He is oriented to person, place, and time. He appears well-developed and well-nourished. No distress.  HENT:  Head: Normocephalic and atraumatic.  Mouth/Throat: Oropharynx is clear and moist.  Eyes: Conjunctivae are normal. Pupils are equal, round, and reactive to light. No scleral icterus.  Neck: Neck supple.  Cardiovascular: Normal rate, regular rhythm, normal heart sounds and intact distal pulses.   No murmur heard. Pulmonary/Chest: Effort normal. No stridor. No respiratory distress. He has no wheezes. He has rales (right base).  Abdominal: Soft. He exhibits no distension. There is no tenderness.  Musculoskeletal: Normal range of motion. He exhibits no edema.  Neurological: He is alert and oriented to person, place, and time.  Skin: Skin is warm and dry. No rash noted.  Psychiatric: He has a normal mood and affect. His behavior is normal.    ED Course  Procedures (including critical care time) Labs Review Labs Reviewed  CBC WITH DIFFERENTIAL - Abnormal; Notable for the following:    WBC 14.3 (*)    RBC 1.93 (*)    Hemoglobin 6.7 (*)    HCT 19.9 (*)    MCV 103.1 (*)    MCH 34.7 (*)    RDW 20.1 (*)    Platelets 405 (*)    Neutro Abs 8.8 (*)    Lymphs Abs 4.4 (*)    All other components within normal limits  RETICULOCYTES - Abnormal; Notable for the following:    Retic Ct Pct >23.0 (*)    RBC. 1.97 (*)    All other components within normal limits  BASIC METABOLIC PANEL  URINALYSIS, ROUTINE W REFLEX MICROSCOPIC  CBC WITH DIFFERENTIAL  RETICULOCYTES  TROPONIN I  TYPE AND SCREEN   Imaging Review Dg Chest 2 View  09/24/2013   CLINICAL DATA:  Cough and sickle cell anemia  EXAM: CHEST  2 VIEW  COMPARISON:  09/12/2013  FINDINGS: Cardiac shadow is stable. A right-sided chest wall port is again seen and stable. Postsurgical changes in the left shoulder are noted. No  focal infiltrate is seen. No acute bony abnormality is noted.  IMPRESSION: No acute abnormality seen.   Electronically Signed   By: Inez Catalina M.D.   On: 09/24/2013 18:41   Ct Angio Chest Pe W/cm &/or Wo Cm  09/24/2013   CLINICAL DATA:  Sickle cell crisis with chest pain and shortness of breath  EXAM:  CT ANGIOGRAPHY CHEST WITH CONTRAST  TECHNIQUE: Multidetector CT imaging of the chest was performed using the standard protocol during bolus administration of intravenous contrast. Multiplanar CT image reconstructions including MIPs were obtained to evaluate the vascular anatomy.  CONTRAST:  175mL OMNIPAQUE IOHEXOL 350 MG/ML SOLN  COMPARISON:  Chest x-ray from earlier in the same day.  FINDINGS: The lungs are well aerated bilaterally without focal infiltrate or sizable effusion. The right-sided chest wall port is again seen. The thoracic aorta is within normal limits. The pulmonary artery shows no definitive filling defects. No hilar or mediastinal adenopathy is seen. 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  Scanning into the upper abdomen demonstrates chronic changes within the spleen consistent with the given clinical history. The bony structures demonstrate a mottled appearance consistent with the underlying clinical history.  Review of the MIP images confirms the above findings.  IMPRESSION: No evidence of pulmonary emboli.  Chronic changes consistent with the underlying clinical disease process. No acute abnormality is noted.   Electronically Signed   By: Inez Catalina M.D.   On: 09/24/2013 20:18  All radiology studies independently viewed by me.    EKG Interpretation    Date/Time:  Sunday September 24 2013 18:24:54 EST Ventricular Rate:  95 PR Interval:  147 QRS Duration: 80 QT Interval:  333 QTC Calculation: 419 R Axis:   35 Text Interpretation:  Sinus rhythm Borderline T abnormalities, anterior leads t wave abnormalities slightly more prominent Confirmed by [facility]  MD, TREY ([id number]) on 09/24/2013  7:15:55 PM            MDM   1. Sickle cell anemia with crisis   2. Chest tightness   3. Chronic pain   4. Narcotic habituation, continuous   5. Non-ABO incompatible blood transfusion, subsequent encounter   6. Sickle cell anemia   7. Sickle cell crisis    28 yo male with hx of SCD presenting with back pain, leg pain, and dyspnea on exertion.  Tachycardic, but nontoxic on arrival.  CXR and CTA negative for explanation for his DOE.  His Hg is low, which is likely explanation for this.  His pain was not well controlled despite multiple rounds of narcotic pain medications.  He will be admitted to internal medicine for further management.      Houston Siren, MD 09/24/13 [phone]

## 2013-09-25 ENCOUNTER — Encounter (HOSPITAL_COMMUNITY): Payer: Self-pay | Admitting: *Deleted

## 2013-09-25 LAB — CBC WITH DIFFERENTIAL/PLATELET
BASOS ABS: 0 10*3/uL (ref 0.0–0.1)
Basophils Relative: 0 % (ref 0–1)
EOS ABS: 0 10*3/uL (ref 0.0–0.7)
Eosinophils Relative: 0 % (ref 0–5)
HCT: 15.9 % — ABNORMAL LOW (ref 39.0–52.0)
Hemoglobin: 5.4 g/dL — CL (ref 13.0–17.0)
Lymphocytes Relative: 6 % — ABNORMAL LOW (ref 12–46)
Lymphs Abs: 3.2 10*3/uL (ref 0.7–4.0)
MCH: 34.8 pg — ABNORMAL HIGH (ref 26.0–34.0)
MCHC: 34 g/dL (ref 30.0–36.0)
MCV: 102.6 fL — ABNORMAL HIGH (ref 78.0–100.0)
Monocytes Absolute: 1.6 10*3/uL — ABNORMAL HIGH (ref 0.1–1.0)
Monocytes Relative: 3 % (ref 3–12)
NEUTROS PCT: 91 % — AB (ref 43–77)
Neutro Abs: 48.3 10*3/uL — ABNORMAL HIGH (ref 1.7–7.7)
Platelets: 302 10*3/uL (ref 150–400)
RBC: 1.55 MIL/uL — ABNORMAL LOW (ref 4.22–5.81)
RDW: 19.4 % — AB (ref 11.5–15.5)
WBC: 53.1 10*3/uL (ref 4.0–10.5)

## 2013-09-25 LAB — TYPE AND SCREEN
ABO/RH(D): AB POS
Antibody Screen: POSITIVE
DAT, IgG: POSITIVE

## 2013-09-25 LAB — TROPONIN I: Troponin I: <0.3 ng/mL (ref <0.30)

## 2013-09-25 LAB — RETICULOCYTES
RBC.: 1.55 MIL/uL — AB (ref 4.22–5.81)
RETIC CT PCT: 22.6 % — AB (ref 0.4–3.1)
Retic Count, Absolute: 359.6 10*3/uL — ABNORMAL HIGH (ref 19.0–186.0)

## 2013-09-25 MED ORDER — HYDROMORPHONE 0.3 MG/ML IV SOLN
INTRAVENOUS | Status: DC
  Administered 2013-09-25: 3.19 mg via INTRAVENOUS
  Administered 2013-09-25: 22:00:00 via INTRAVENOUS
  Administered 2013-09-25: 3.59 mg via INTRAVENOUS
  Administered 2013-09-25: 0.4 mg via INTRAVENOUS
  Administered 2013-09-25 (×2): 0.3 mg via INTRAVENOUS
  Administered 2013-09-25: 2.29 mg via INTRAVENOUS
  Administered 2013-09-26: 0.3 mg via INTRAVENOUS
  Administered 2013-09-26: 3.54 mg via INTRAVENOUS
  Administered 2013-09-26: 2.59 mg via INTRAVENOUS
  Administered 2013-09-26: 2.97 mg via INTRAVENOUS
  Administered 2013-09-26: 2.99 mg via INTRAVENOUS
  Administered 2013-09-26: 1.59 mg via INTRAVENOUS
  Administered 2013-09-26: 20:00:00 via INTRAVENOUS
  Administered 2013-09-27: 05:00:00 0.3 mg via INTRAVENOUS
  Administered 2013-09-27: 7.19 mg via INTRAVENOUS
  Filled 2013-09-25 (×6): qty 25

## 2013-09-25 MED ORDER — SODIUM CHLORIDE 0.9 % IJ SOLN: 9.0000 mL | INTRAMUSCULAR | Status: DC | PRN

## 2013-09-25 MED ORDER — ONDANSETRON HCL 4 MG/2ML IJ SOLN: 4.0000 mg | Freq: Four times a day (QID) | INTRAMUSCULAR | Status: DC | PRN

## 2013-09-25 MED ORDER — NALOXONE HCL 0.4 MG/ML IJ SOLN: 0.4000 mg | INTRAMUSCULAR | Status: DC | PRN

## 2013-09-25 MED ORDER — HYDROMORPHONE 0.3 MG/ML IV SOLN: INTRAVENOUS | Status: DC

## 2013-09-25 NOTE — Progress Notes (Signed)
HGB returns this am at 5.4.  PN states will not transfuse unless he gets below 5.0.   WBC's also remain high at 53.1

## 2013-09-25 NOTE — Progress Notes (Signed)
[facility] PROGRESS NOTE  Dennis Bernard IEP:[id number] DOB: [birth year] DOA: 09/24/2013 PCP: Wilhelmina Mcardle, MD  Assessment/Plan: 1. Sickle cell pain crisis 1. On PCA pain meds 2. Cont IVF and O2 as tolerated 3. Transfuse only if needed 2. Anemia 1. Per above, transfuse only as needed 3. Leukocytosis 1. Likely secondary to sickle crisis 2. Follow 4. DVT prophylaxis 1. SCD's  Code Status: Full Family Communication: Pt in room (indicate person spoken with, relationship, and if by phone, the number) Disposition Plan: Pending   HPI/Subjective: No acute events. Pt with continued pain  Objective: Filed Vitals:   09/25/13 0500 09/25/13 0553 09/25/13 0800 09/25/13 0841  BP: 112/70     Pulse: 100     Temp: 98.9 F (37.2 C)     TempSrc: Oral     Resp: 16 18 15 15   Height:      Weight:      SpO2: 96% 96% 96% 96%    Intake/Output Summary (Last 24 hours) at 09/25/13 1123 Last data filed at 09/25/13 1039  Gross per 24 hour  Intake   3605 ml  Output   1150 ml  Net   2455 ml   Filed Weights   09/24/13 2355  Weight: 80.151 kg (176 lb 11.2 oz)    Exam:   General:  Awake, in nad  Cardiovascular: regular, s1, s2  Respiratory: normal resp effort, no wheezing  Abdomen: soft, nondistended  Musculoskeletal: perfused, no clubbing   Data Reviewed: Basic Metabolic Panel:  Recent Labs Lab 09/24/13 1820  NA 140  K 4.1  CL 106  CO2 23  GLUCOSE 79  BUN 11  CREATININE 0.82  CALCIUM 9.2   Liver Function Tests: No results found for this basename: AST, ALT, ALKPHOS, BILITOT, PROT, ALBUMIN,  in the last 168 hours No results found for this basename: LIPASE, AMYLASE,  in the last 168 hours No results found for this basename: AMMONIA,  in the last 168 hours CBC:  Recent Labs Lab 09/24/13 1820 09/25/13 0635  WBC 14.3* 53.1*  NEUTROABS 8.8* 48.3*  HGB 6.7* 5.4*  HCT 19.9* 15.9*  MCV 103.1* 102.6*  PLT 405* 302   Cardiac Enzymes:  Recent Labs Lab  09/25/13 0115  TROPONINI <0.30   BNP (last 3 results) No results found for this basename: PROBNP,  in the last 8760 hours CBG: No results found for this basename: GLUCAP,  in the last 168 hours  No results found for this or any previous visit (from the past 240 hour(s)).   Studies: Dg Chest 2 View  09/24/2013   CLINICAL DATA:  Cough and sickle cell anemia  EXAM: CHEST  2 VIEW  COMPARISON:  09/12/2013  FINDINGS: Cardiac shadow is stable. A right-sided chest wall port is again seen and stable. Postsurgical changes in the left shoulder are noted. No focal infiltrate is seen. No acute bony abnormality is noted.  IMPRESSION: No acute abnormality seen.   Electronically Signed   By: Inez Catalina M.D.   On: 09/24/2013 18:41   Ct Angio Chest Pe W/cm &/or Wo Cm  09/24/2013   CLINICAL DATA:  Sickle cell crisis with chest pain and shortness of breath  EXAM: CT ANGIOGRAPHY CHEST WITH CONTRAST  TECHNIQUE: Multidetector CT imaging of the chest was performed using the standard protocol during bolus administration of intravenous contrast. Multiplanar CT image reconstructions including MIPs were obtained to evaluate the vascular anatomy.  CONTRAST:  131mL OMNIPAQUE IOHEXOL 350 MG/ML SOLN  COMPARISON:  Chest x-ray  from earlier in the same day.  FINDINGS: The lungs are well aerated bilaterally without focal infiltrate or sizable effusion. The right-sided chest wall port is again seen. The thoracic aorta is within normal limits. The pulmonary artery shows no definitive filling defects. No hilar or mediastinal adenopathy is seen. 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  Scanning into the upper abdomen demonstrates chronic changes within the spleen consistent with the given clinical history. The bony structures demonstrate a mottled appearance consistent with the underlying clinical history.  Review of the MIP images confirms the above findings.  IMPRESSION: No evidence of pulmonary emboli.  Chronic changes consistent with  the underlying clinical disease process. No acute abnormality is noted.   Electronically Signed   By: Inez Catalina M.D.   On: 09/24/2013 20:18    Scheduled Meds: . folic acid  1 mg Oral q morning - 10a  . HYDROmorphone PCA 0.3 mg/mL   Intravenous Q4H  . hydroxyurea  500 mg Oral TID  . ketorolac  30 mg Intravenous Q6H  . morphine  60 mg Oral BID  . multivitamin with minerals  1 tablet Oral Daily  . polyethylene glycol  17 g Oral BID   Continuous Infusions: . sodium chloride 125 mL/hr at 09/25/13 1478    Principal Problem:   Sickle cell pain crisis Active Problems:   Sickle cell anemia   Chronic pain   Narcotic habituation, continuous   Non-ABO incompatible blood transfusion   Sickle cell anemia with crisis  Time spent: 30min  Cing Vancleave, [facility] Pager [phone]. If 7PM-7AM, please contact night-coverage at [email or web address], password [facility] 09/25/2013, 11:23 AM  LOS: 1 day

## 2013-09-25 NOTE — Progress Notes (Signed)
CN, Sophia rn,  Informs me thatPt has hx of tampering with medical equipment on last admission.  Specifically, turning up rate of IVF when her bolused his pca.  Much education and reinforcement done with pt re: medical equipment and lock placed in the "Locked" position on the PCA?ivf pumps.  Pt verbalizes understanding and states he will not tamper with medical equipment and understand the danger of doing so.

## 2013-09-25 NOTE — Progress Notes (Signed)
Pharmacy Re: Hydrea   Mr. 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 Patient is followed by Dr. Beryle Beams and was at [facility] approximately two weeks ago. Patient's Hgb is acceptable at ~5 g/dL with goal of minimizing transfusion. Per hospital policy, Hydrea should be held if any of the following criteria is met ...  Hydroxyurea (Hydrea) Hold Criteria  ANC < 2000  Pltc < 80K in sickle-cell patients; < 100K in other patients  Hgb <= 6 in sickle-cell patients; < 8 in other patients  Reticulocytes < 80K when Hgb < 9  Per previous admissions, Dr.Granfortuna opted to continue Hydrea despite low Hgb. Today's Hgb is 5.4 g/dL and no transfusion planned until Hgb < 5 g/dL. Given previous recommendation from Dr. 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  Thanks   Vanessa [place], PharmD, BCPS Pager: [phone] 11:39 AM Pharmacy #: [phone]

## 2013-09-26 DIAGNOSIS — Z91199 Patient's noncompliance with other medical treatment and regimen due to unspecified reason: Secondary | ICD-10-CM

## 2013-09-26 DIAGNOSIS — R5383 Other fatigue: Secondary | ICD-10-CM

## 2013-09-26 DIAGNOSIS — Z9119 Patient's noncompliance with other medical treatment and regimen: Secondary | ICD-10-CM

## 2013-09-26 DIAGNOSIS — R5381 Other malaise: Secondary | ICD-10-CM

## 2013-09-26 DIAGNOSIS — M549 Dorsalgia, unspecified: Secondary | ICD-10-CM

## 2013-09-26 MED ORDER — SODIUM CHLORIDE 0.9 % IV SOLN
INTRAVENOUS | Status: DC
  Administered 2013-09-26 – 2013-09-27 (×2): via INTRAVENOUS
  Administered 2013-09-27 (×2): 125 mL/h via INTRAVENOUS
  Administered 2013-09-28 – 2013-10-03 (×6): via INTRAVENOUS

## 2013-09-26 NOTE — Progress Notes (Signed)
Patient complained that current PCA dose was not helping his pain.  Rates pain 9/10 while sitting on side of bed watching T.V.  Notified NP on call who states she will not change any medications and that patient should discuss pain management with rounding MD.  Will continue to monitor patient.

## 2013-09-26 NOTE — Progress Notes (Signed)
[facility] PROGRESS NOTE  Dennis Bernard [id number]:[id number] DOB: 1986/04/19 DOA: 09/24/2013 PCP: Wilhelmina Mcardle, MD  Assessment/Plan: 1. Sickle cell pain crisis 1. Currently on PCA pain meds 2. Pt reports pain this AM AFTER WAKING THE PATIENT UP 3. Consider pain med weaning when pain level is less - currently 9/10 4. Staff reports history of patient unlocking and self-titrating PCA pump at previous recent admit 5. Cont IVF and O2 as tolerated 6. Transfuse only if needed 2. Anemia 1. Per above, transfuse only as needed 3. Leukocytosis 1. Likely secondary to sickle crisis 2. Follow 4. DVT prophylaxis 1. SCD's  Code Status: Full Family Communication: Pt in room (indicate person spoken with, relationship, and if by phone, the number) Disposition Plan: Pending  HPI/Subjective: No acute events. Pt with continued pain  Objective: Filed Vitals:   09/26/13 0033 09/26/13 0400 09/26/13 0500 09/26/13 0800  BP:   112/65   Pulse:   91   Temp:   98.5 F (36.9 C)   TempSrc:   Oral   Resp: 14 13 16 15   Height:      Weight:      SpO2:  100% 100% 99%    Intake/Output Summary (Last 24 hours) at 09/26/13 1106 Last data filed at 09/26/13 0411  Gross per 24 hour  Intake    480 ml  Output   2000 ml  Net  -1520 ml   Filed Weights   09/24/13 2355  Weight: 80.151 kg (176 lb 11.2 oz)    Exam:   General:  Awake, in nad  Cardiovascular: regular, s1, s2  Respiratory: normal resp effort, no wheezing  Abdomen: soft, nondistended  Musculoskeletal: perfused, no clubbing   Data Reviewed: Basic Metabolic Panel:  Recent Labs Lab 09/24/13 1820  NA 140  K 4.1  CL 106  CO2 23  GLUCOSE 79  BUN 11  CREATININE 0.82  CALCIUM 9.2   Liver Function Tests: No results found for this basename: AST, ALT, ALKPHOS, BILITOT, PROT, ALBUMIN,  in the last 168 hours No results found for this basename: LIPASE, AMYLASE,  in the last 168 hours No results found for this basename: AMMONIA,   in the last 168 hours CBC:  Recent Labs Lab 09/24/13 1820 09/25/13 0635  WBC 14.3* 53.1*  NEUTROABS 8.8* 48.3*  HGB 6.7* 5.4*  HCT 19.9* 15.9*  MCV 103.1* 102.6*  PLT 405* 302   Cardiac Enzymes:  Recent Labs Lab 09/25/13 0115  TROPONINI <0.30   BNP (last 3 results) No results found for this basename: PROBNP,  in the last 8760 hours CBG: No results found for this basename: GLUCAP,  in the last 168 hours  No results found for this or any previous visit (from the past 240 hour(s)).   Studies: Dg Chest 2 View  09/24/2013   CLINICAL DATA:  Cough and sickle cell anemia  EXAM: CHEST  2 VIEW  COMPARISON:  09/12/2013  FINDINGS: Cardiac shadow is stable. A right-sided chest wall port is again seen and stable. Postsurgical changes in the left shoulder are noted. No focal infiltrate is seen. No acute bony abnormality is noted.  IMPRESSION: No acute abnormality seen.   Electronically Signed   By: Inez Catalina M.D.   On: 09/24/2013 18:41   Ct Angio Chest Pe W/cm &/or Wo Cm  09/24/2013   CLINICAL DATA:  Sickle cell crisis with chest pain and shortness of breath  EXAM: CT ANGIOGRAPHY CHEST WITH CONTRAST  TECHNIQUE: Multidetector CT imaging of the chest  was performed using the standard protocol during bolus administration of intravenous contrast. Multiplanar CT image reconstructions including MIPs were obtained to evaluate the vascular anatomy.  CONTRAST:  142mL OMNIPAQUE IOHEXOL 350 MG/ML SOLN  COMPARISON:  Chest x-ray from earlier in the same day.  FINDINGS: The lungs are well aerated bilaterally without focal infiltrate or sizable effusion. The right-sided chest wall port is again seen. The thoracic aorta is within normal limits. The pulmonary artery shows no definitive filling defects. No hilar or mediastinal adenopathy is seen. 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  Scanning into the upper abdomen demonstrates chronic changes within the spleen consistent with the given clinical history. The  bony structures demonstrate a mottled appearance consistent with the underlying clinical history.  Review of the MIP images confirms the above findings.  IMPRESSION: No evidence of pulmonary emboli.  Chronic changes consistent with the underlying clinical disease process. No acute abnormality is noted.   Electronically Signed   By: Inez Catalina M.D.   On: 09/24/2013 20:18    Scheduled Meds: . folic acid  1 mg Oral q morning - 10a  . HYDROmorphone PCA 0.3 mg/mL   Intravenous Q4H  . hydroxyurea  500 mg Oral TID  . ketorolac  30 mg Intravenous Q6H  . morphine  60 mg Oral BID  . multivitamin with minerals  1 tablet Oral Daily  . polyethylene glycol  17 g Oral BID   Continuous Infusions:    Principal Problem:   Sickle cell pain crisis Active Problems:   Sickle cell anemia   Chronic pain   Narcotic habituation, continuous   Non-ABO incompatible blood transfusion   Sickle cell anemia with crisis  Time spent: 68min  Dennis Bernard, [facility] Pager [phone]. If 7PM-7AM, please contact night-coverage at [email or web address], password [facility] 09/26/2013, 11:06 AM  LOS: 2 days

## 2013-09-27 ENCOUNTER — Ambulatory Visit: Payer: Medicare Other | Admitting: Oncology

## 2013-09-27 LAB — CBC
HCT: 15.8 % — ABNORMAL LOW (ref 39.0–52.0)
HEMOGLOBIN: 5.4 g/dL — AB (ref 13.0–17.0)
MCH: 33.3 pg (ref 26.0–34.0)
MCHC: 34.2 g/dL (ref 30.0–36.0)
MCV: 97.5 fL (ref 78.0–100.0)
Platelets: 363 10*3/uL (ref 150–400)
RBC: 1.62 MIL/uL — AB (ref 4.22–5.81)
RDW: 17.2 % — ABNORMAL HIGH (ref 11.5–15.5)
WBC: 12.2 10*3/uL — ABNORMAL HIGH (ref 4.0–10.5)

## 2013-09-27 MED ORDER — HYDROMORPHONE HCL PF 2 MG/ML IJ SOLN
2.0000 mg | INTRAMUSCULAR | Status: DC | PRN
  Administered 2013-09-27 (×3): 2 mg via INTRAVENOUS
  Filled 2013-09-27 (×3): qty 1

## 2013-09-27 MED ORDER — HYDROMORPHONE HCL PF 2 MG/ML IJ SOLN
1.5000 mg | INTRAMUSCULAR | Status: DC | PRN
  Administered 2013-09-27 – 2013-09-28 (×8): 1.5 mg via INTRAVENOUS
  Filled 2013-09-27 (×8): qty 1

## 2013-09-27 NOTE — Progress Notes (Signed)
PROGRESS NOTE  Dennis Bernard LZJ:[id number] DOB: Mar 04, 1986 DOA: 09/24/2013 PCP: Wilhelmina Mcardle, MD  HPI: 28 yo male with 2 days of leg and back pain worsening despite him taking his regular ms contin along with his po dilaudid every 4 hours (much more than he usually does). He came to ED for uncontrolled pain. Reports low grade temp. Wheezing occasionally, none now. No nasal congestion. No cough. No nasal congestion. No sore throat. No sick contacts. No n/v/d. He has received several rounds of 1mg  dilaudid iv in ED with no help. Also having some sob when he walks a lot.  Assessment/Plan: Sickle cell pain crisis  - continue IVF, pain control. Hb this morning stable at 5.4 Threshold for transfusion < 5.0. Difficult to transfuse since he has multiple antibodies and needs pre-medication.  Anemia - stable, due to #1 Leukocytosis - severe, due to #1, significantly improved this morning.   Diet: regular Fluids: NS DVT Prophylaxis: SCD  Code Status: Full Family Communication: none  Disposition Plan: home when ready  Consultants:  none  Procedures:  none   Antibiotics - none  HPI/Subjective: - rates his pain as 9/10  Objective: Filed Vitals:   09/27/13 0400 09/27/13 0450 09/27/13 0651 09/27/13 1432  BP:   109/63 118/67  Pulse:   86 93  Temp:   97.7 F (36.5 C) 98.5 F (36.9 C)  TempSrc:   Oral Oral  Resp: 16 16 14 16   Height:      Weight:      SpO2: 99% 98% 98% 100%    Intake/Output Summary (Last 24 hours) at 09/27/13 1710 Last data filed at 09/27/13 1703  Gross per 24 hour  Intake 2032.63 ml  Output   3395 ml  Net -1362.37 ml   Filed Weights   09/24/13 2355  Weight: 80.151 kg (176 lb 11.2 oz)    Exam:  General:  NAD  Cardiovascular: regular rate and rhythm, without MRG  Respiratory: good air movement, clear to auscultation throughout, no wheezing, ronchi or rales  Abdomen: soft, not tender to palpation, positive bowel sounds  MSK: no peripheral  edema  Neuro: CN 2-12 grossly intact, MS 5/5 in all 4  Data Reviewed: Basic Metabolic Panel:  Recent Labs Lab 09/24/13 1820  NA 140  K 4.1  CL 106  CO2 23  GLUCOSE 79  BUN 11  CREATININE 0.82  CALCIUM 9.2   CBC:  Recent Labs Lab 09/24/13 1820 09/25/13 0635 09/27/13 1358  WBC 14.3* 53.1* 12.2*  NEUTROABS 8.8* 48.3*  --   HGB 6.7* 5.4* 5.4*  HCT 19.9* 15.9* 15.8*  MCV 103.1* 102.6* 97.5  PLT 405* 302 363   Cardiac Enzymes:  Recent Labs Lab 09/25/13 0115  TROPONINI <0.30   Studies: No results found.  Scheduled Meds: . folic acid  1 mg Oral q morning - 10a  . hydroxyurea  500 mg Oral TID  . ketorolac  30 mg Intravenous Q6H  . morphine  60 mg Oral BID  . multivitamin with minerals  1 tablet Oral Daily  . polyethylene glycol  17 g Oral BID   Continuous Infusions: . sodium chloride 125 mL/hr (09/27/13 1258)    Principal Problem:   Sickle cell pain crisis Active Problems:   Sickle cell anemia   Chronic pain   Narcotic habituation, continuous   Non-ABO incompatible blood transfusion   Sickle cell anemia with crisis   Time spent: [place], MD [facility] Pager [phone]. If 7 PM -  7 AM, please contact night-coverage at [email or web address], password [facility] 09/27/2013, 5:10 PM  LOS: 3 days

## 2013-09-28 ENCOUNTER — Other Ambulatory Visit (HOSPITAL_COMMUNITY): Payer: Medicare Other

## 2013-09-28 ENCOUNTER — Inpatient Hospital Stay (HOSPITAL_COMMUNITY): Payer: Medicare Other

## 2013-09-28 DIAGNOSIS — M545 Low back pain, unspecified: Secondary | ICD-10-CM

## 2013-09-28 DIAGNOSIS — M87059 Idiopathic aseptic necrosis of unspecified femur: Secondary | ICD-10-CM

## 2013-09-28 MED ORDER — GADOBENATE DIMEGLUMINE 529 MG/ML IV SOLN
16.0000 mL | Freq: Once | INTRAVENOUS | Status: AC | PRN
  Administered 2013-09-28: 15:00:00 16 mL via INTRAVENOUS

## 2013-09-28 MED ORDER — HYDROMORPHONE HCL PF 2 MG/ML IJ SOLN
1.5000 mg | INTRAMUSCULAR | Status: DC | PRN
Start: 2013-09-28 — End: 2013-10-02
  Administered 2013-09-28 (×2): 2 mg via INTRAVENOUS
  Administered 2013-09-28 – 2013-09-29 (×3): 1.5 mg via INTRAVENOUS
  Administered 2013-09-29: 2 mg via INTRAVENOUS
  Administered 2013-09-29: 1.5 mg via INTRAVENOUS
  Administered 2013-09-29 (×2): 2 mg via INTRAVENOUS
  Administered 2013-09-29 (×3): 1.5 mg via INTRAVENOUS
  Administered 2013-09-29: 2 mg via INTRAVENOUS
  Administered 2013-09-29 (×2): 1.5 mg via INTRAVENOUS
  Administered 2013-09-30: 2 mg via INTRAVENOUS
  Administered 2013-09-30 (×2): 1.5 mg via INTRAVENOUS
  Administered 2013-09-30 (×3): 2 mg via INTRAVENOUS
  Administered 2013-09-30 (×2): 1.5 mg via INTRAVENOUS
  Administered 2013-09-30 – 2013-10-01 (×2): 2 mg via INTRAVENOUS
  Administered 2013-10-01 (×2): 1.5 mg via INTRAVENOUS
  Administered 2013-10-01 (×2): 2 mg via INTRAVENOUS
  Administered 2013-10-01 (×3): 1.5 mg via INTRAVENOUS
  Administered 2013-10-01: 2 mg via INTRAVENOUS
  Administered 2013-10-01: 1.5 mg via INTRAVENOUS
  Administered 2013-10-01: 2 mg via INTRAVENOUS
  Administered 2013-10-02 (×2): 1.5 mg via INTRAVENOUS
  Administered 2013-10-02 (×2): 2 mg via INTRAVENOUS
  Administered 2013-10-02: 1.5 mg via INTRAVENOUS
  Administered 2013-10-02: 2 mg via INTRAVENOUS
  Filled 2013-09-28 (×41): qty 1

## 2013-09-28 NOTE — Progress Notes (Signed)
   PROGRESS NOTE  Dennis Bernard [id number]:[id number] DOB: 1986-06-06 DOA: 09/24/2013 PCP: Wilhelmina Mcardle, MD  Assessment/Plan: Sickle cell pain crisis  - continue IVF, pain control. Hb this morning stable at 5.4 Threshold for transfusion < 5.0. Difficult to transfuse since he has multiple  antibodies and needs pre-medication.  - appreciate Dr. Beryle Beams input, will obtain MR L/S Anemia - stable, due to #1 Leukocytosis - severe, due to #1, improved.   Diet: regular Fluids: NS DVT Prophylaxis: SCD  Code Status: Full Family Communication: none  Disposition Plan: home when ready  Consultants:  none  Procedures:  none   Antibiotics - none  HPI/Subjective: - rates his pain as 9/10  Objective: Filed Vitals:   09/27/13 1432 09/27/13 2050 09/28/13 0150 09/28/13 0615  BP: 118/67 123/74 121/79 115/73  Pulse: 93 86 91 96  Temp: 98.5 F (36.9 C) 98.2 F (36.8 C) 98.4 F (36.9 C) 97.9 F (36.6 C)  TempSrc: Oral Oral Oral Oral  Resp: 16 14 16 16   Height:      Weight:      SpO2: 100% 100% 100% 100%    Intake/Output Summary (Last 24 hours) at 09/28/13 1207 Last data filed at 09/28/13 1021  Gross per 24 hour  Intake   4025 ml  Output   3680 ml  Net    345 ml   Filed Weights   09/24/13 2355  Weight: 80.151 kg (176 lb 11.2 oz)   Exam:  General:  NAD  Cardiovascular: regular rate and rhythm, without MRG  Respiratory: good air movement, clear to auscultation throughout, no wheezing, ronchi or rales  Abdomen: soft, not tender to palpation, positive bowel sounds  MSK: no peripheral edema  Neuro: CN 2-12 grossly intact, MS 5/5 in all 4  Data Reviewed: Basic Metabolic Panel:  Recent Labs Lab 09/24/13 1820  NA 140  K 4.1  CL 106  CO2 23  GLUCOSE 79  BUN 11  CREATININE 0.82  CALCIUM 9.2   CBC:  Recent Labs Lab 09/24/13 1820 09/25/13 0635 09/27/13 1358  WBC 14.3* 53.1* 12.2*  NEUTROABS 8.8* 48.3*  --   HGB 6.7* 5.4* 5.4*  HCT 19.9* 15.9* 15.8*    MCV 103.1* 102.6* 97.5  PLT 405* 302 363   Cardiac Enzymes:  Recent Labs Lab 09/25/13 0115  TROPONINI <0.30   Studies: No results found.  Scheduled Meds: . folic acid  1 mg Oral q morning - 10a  . hydroxyurea  500 mg Oral TID  . ketorolac  30 mg Intravenous Q6H  . morphine  60 mg Oral BID  . multivitamin with minerals  1 tablet Oral Daily  . polyethylene glycol  17 g Oral BID   Continuous Infusions: . sodium chloride 50 mL/hr at 09/28/13 1140   Principal Problem:   Sickle cell pain crisis Active Problems:   Sickle cell anemia   Chronic pain   Narcotic habituation, continuous   Non-ABO incompatible blood transfusion   Sickle cell anemia with crisis  Time spent: [place], MD [facility] Pager [phone]. If 7 PM - 7 AM, please contact night-coverage at [email or web address], password [facility] 09/28/2013, 12:07 PM  LOS: 4 days

## 2013-09-28 NOTE — Progress Notes (Signed)
Progress Note:  Subjective: 28 year old sickle cell patient well known to me. He has had frequent admissions for prolonged sickle crises. Pain primarily localized to his low back and right leg. I don't think that he has been out of the hospital for more than 2 weeks at a time in the last year. 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 I have not recommended getting a blood transfusion unless his hemoglobin is less than 5 or he is otherwise clinically unstable. He was just discharged right before Christmas only to be readmitted on January 11. He has been experiencing low-grade fevers up to 100.8. His headache intermittent mild dry cough. He has had intermittent chest tightness. Main symptom is pain in the lumbosacral area and in the right femur. Regular x-rays of his spine done 08/23/2013 show bone changes consistent with sickle cell disease but no compression fractures. No soft tissue abnormalities to suggest paraspinal abscess.     Vitals: Filed Vitals:   09/28/13 0615  BP: 115/73  Pulse: 96  Temp: 97.9 F (36.6 C)  Resp: 16   Wt Readings from Last 3 Encounters:  09/24/13 176 lb 11.2 oz (80.151 kg)  09/14/13 181 lb 9.6 oz (82.373 kg)  08/28/13 170 lb 13.7 oz (77.5 kg)     PHYSICAL EXAM:  General Young African American man NAD Head: Normal Eyes: Normal Throat: No exudate Neck: Full range of motion Lymph Nodes: No cervical supraclavicular or axillary adenopathy Lungs: Clear to auscultation resonant to percussion Breasts:  Cardiac: Regular rhythm no murmur Abdominal: Soft, nontender, no mass, no organomegaly Extremities: Trace to 1+ leg edema Vascular:  No  cyanosis Neurologic he is alert and oriented, cranial nerves grossly normal, PERRLA, strength 5 over 5, reflexes 1+ symmetric Skin: No rash or ecchymosis  Labs:   Recent Labs  09/27/13 1358  WBC 12.2*  HGB 5.4*  HCT 15.8*  PLT 363   No results found for this basename: NA, K, CL, CO2, GLUCOSE, BUN, CREATININE, CALCIUM,  in the last 72 hours    Images Studies/Results:   No results found.   Patient Active Problem List   Diagnosis Date Noted  . Sickle cell anemia with crisis 09/24/2013  . Sickle cell crisis 09/04/2013  . Chest tightness 09/04/2013  . Fracture of phalanx of left ring finger 08/07/2013  . Pulmonary venous congestion 07/22/2013  . Acute bronchitis 07/22/2013  . SIRS (systemic inflammatory response syndrome) 07/21/2013  . 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 Fever, unspecified 06/04/2013  . Non-ABO incompatible blood transfusion 05/30/2013  . Other malaise and fatigue 05/14/2013  . Lump of skin of left upper extremity 03/24/2013  . Narcotic habituation, continuous 02/14/2013  . H/O medication noncompliance 02/13/2013  . Sickle cell anemia 09/22/2012  . Sickle cell pain crisis 08/16/2012  . Chronic pain 05/31/2012  . Backache 03/13/2012    Assessment and Plan:  #1. Recurrent sickle crisis with pain localized to the lumbosacral area. 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 History of delayed transfusion reaction #4. 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 #5. Early aseptic necrosis right hip.  Recommendation: It might be useful to get an MRI of his lumbosacral spine to rule out any other pathology.  Continue  to be very judicious about giving blood transfusions, use only least incompatible units, routinely premedicate with Solu-Medrol 40 mg IV, Tylenol 650 mg by mouth, and Benadryl 50 mg by mouth prior to each unit.   GRANFORTUNA,JAMES M 09/28/2013, 8:57 AM

## 2013-09-29 LAB — CBC
HCT: 14.1 % — ABNORMAL LOW (ref 39.0–52.0)
Hemoglobin: 4.9 g/dL — CL (ref 13.0–17.0)
MCH: 33.8 pg (ref 26.0–34.0)
MCHC: 34.8 g/dL (ref 30.0–36.0)
MCV: 97.2 fL (ref 78.0–100.0)
Platelets: 379 10*3/uL (ref 150–400)
RBC: 1.45 MIL/uL — ABNORMAL LOW (ref 4.22–5.81)
RDW: 17.8 % — ABNORMAL HIGH (ref 11.5–15.5)
WBC: 9.8 10*3/uL (ref 4.0–10.5)

## 2013-09-29 LAB — PREPARE RBC (CROSSMATCH)

## 2013-09-29 MED ORDER — ACETAMINOPHEN 325 MG PO TABS
650.0000 mg | ORAL_TABLET | Freq: Once | ORAL | Status: AC
  Administered 2013-09-29: 650 mg via ORAL
  Filled 2013-09-29: qty 2

## 2013-09-29 MED ORDER — HYDROMORPHONE HCL 2 MG PO TABS
2.0000 mg | ORAL_TABLET | Freq: Four times a day (QID) | ORAL | Status: DC
  Administered 2013-09-29 – 2013-10-02 (×12): 2 mg via ORAL
  Filled 2013-09-29 (×12): qty 1

## 2013-09-29 MED ORDER — METHYLPREDNISOLONE SODIUM SUCC 40 MG IJ SOLR
40.0000 mg | Freq: Once | INTRAMUSCULAR | Status: AC
  Administered 2013-09-29: 40 mg via INTRAVENOUS
  Filled 2013-09-29: qty 1

## 2013-09-29 MED ORDER — DIPHENHYDRAMINE HCL 50 MG PO CAPS
50.0000 mg | ORAL_CAPSULE | Freq: Once | ORAL | Status: AC
  Administered 2013-09-29: 50 mg via ORAL
  Filled 2013-09-29: qty 1

## 2013-09-29 NOTE — Progress Notes (Signed)
PROGRESS NOTE  Dennis Bernard [id number]:[id number] DOB: Oct 04, 1985 DOA: 09/24/2013 PCP: Wilhelmina Mcardle, MD  Assessment/Plan: Sickle cell pain crisis  - continue IVF, pain control. Hb this morning stable at 5.4 Threshold for transfusion < 5.0. Difficult to transfuse since he has multiple  antibodies and needs pre-medication.  - appreciate Dr. Beryle Beams input - MR unrevealing.  Anemia - - 2U pRBC today Leukocytosis - severe, due to #1, improved.   Diet: regular Fluids: NS DVT Prophylaxis: SCD  Code Status: Full Family Communication: none  Disposition Plan: home when ready  Consultants:  none  Procedures:  none   Antibiotics - none  HPI/Subjective: - rates his pain as 9/10  Objective: Filed Vitals:   09/28/13 1453 09/28/13 2038 09/29/13 0230 09/29/13 0527  BP: 126/72 120/69 114/82 108/69  Pulse: 83 99 89 82  Temp: 98.6 F (37 C) 98.1 F (36.7 C) 97.7 F (36.5 C) 98.1 F (36.7 C)  TempSrc: Oral Oral Oral Oral  Resp: 18 16  16   Height:      Weight:      SpO2: 100% 100% 99% 98%    Intake/Output Summary (Last 24 hours) at 09/29/13 1442 Last data filed at 09/29/13 1108  Gross per 24 hour  Intake   1670 ml  Output   3825 ml  Net  -2155 ml   Filed Weights   09/24/13 2355  Weight: 80.151 kg (176 lb 11.2 oz)   Exam:  General:  NAD  Cardiovascular: regular rate and rhythm, without MRG  Respiratory: good air movement, clear to auscultation throughout, no wheezing, ronchi or rales  Abdomen: soft, not tender to palpation, positive bowel sounds  MSK: no peripheral edema  Neuro: CN 2-12 grossly intact, MS 5/5 in all 4  Data Reviewed: Basic Metabolic Panel:  Recent Labs Lab 09/24/13 1820  NA 140  K 4.1  CL 106  CO2 23  GLUCOSE 79  BUN 11  CREATININE 0.82  CALCIUM 9.2   CBC:  Recent Labs Lab 09/24/13 1820 09/25/13 0635 09/27/13 1358 09/29/13 0555  WBC 14.3* 53.1* 12.2* 9.8  NEUTROABS 8.8* 48.3*  --   --   HGB 6.7* 5.4* 5.4* 4.9*    HCT 19.9* 15.9* 15.8* 14.1*  MCV 103.1* 102.6* 97.5 97.2  PLT 405* 302 363 379   Cardiac Enzymes:  Recent Labs Lab 09/25/13 0115  TROPONINI <0.30   Studies: Mr Lumbar Spine W Wo Contrast  09/28/2013   CLINICAL DATA:  Persistent low back pain.  Sickle cell crisis.  EXAM: MRI LUMBAR SPINE WITHOUT AND WITH CONTRAST  TECHNIQUE: Multiplanar and multiecho pulse sequences of the lumbar spine were obtained without and with intravenous contrast.  CONTRAST:  75mL MULTIHANCE GADOBENATE DIMEGLUMINE 529 MG/ML IV SOLN  COMPARISON:  Radiographs dated 08/23/2013 and 05/17/2013  FINDINGS: Normal appearing conus tip at T12-L1. The patient has slight periaortic adenopathy which is nonspecific but can be seen with sickle cell disease. The largest lymph node is 14 mm in diameter on image number 7 of series 7.  The discs from T11-12 through L5-S1 are normal. There are the typical deformities of the vertebra consistent with sickle cell disease. There is diffuse abnormal signal from the bone marrow, also typical for Sickle cell disease. The bladder is quite distended.  There is no spinal or foraminal stenosis or facet arthritis. There is a tiny ganglion cyst associated with the left facet joint at L4-5. This is not felt to be significant.  IMPRESSION: Changes throughout the visualized bones consistent  with sickle cell disease. No degenerative disc or joint disease in the lumbar spine. Slight nonspecific periaortic adenopathy but this can be seen with sickle cell disease.   Electronically Signed   By: Rozetta Nunnery M.D.   On: 09/28/2013 14:59    Scheduled Meds: . acetaminophen  650 mg Oral Once  . diphenhydrAMINE  50 mg Oral Once  . folic acid  1 mg Oral q morning - 10a  . HYDROmorphone  2 mg Oral Q6H  . hydroxyurea  500 mg Oral TID  . ketorolac  30 mg Intravenous Q6H  . methylPREDNISolone (SOLU-MEDROL) injection  40 mg Intravenous Once  . morphine  60 mg Oral BID  . multivitamin with minerals  1 tablet Oral Daily   . polyethylene glycol  17 g Oral BID   Continuous Infusions: . sodium chloride 50 mL/hr at 09/29/13 0454   Principal Problem:   Sickle cell pain crisis Active Problems:   Sickle cell anemia   Chronic pain   Narcotic habituation, continuous   Non-ABO incompatible blood transfusion   Sickle cell anemia with crisis  Time spent: [place], MD [facility] Pager [phone]. If 7 PM - 7 AM, please contact night-coverage at [email or web address], password [facility] 09/29/2013, 2:42 PM  LOS: 5 days

## 2013-09-29 NOTE — Progress Notes (Signed)
MRI L/S spine non specific changes c/w SSD C/O persistent chest tightness, low back pain Hb 4.9 I have ordered 2 units of blood. Please call me if any concerns  [phone]

## 2013-09-30 LAB — CBC
HEMATOCRIT: 21.8 % — AB (ref 39.0–52.0)
Hemoglobin: 7.4 g/dL — ABNORMAL LOW (ref 13.0–17.0)
MCH: 32 pg (ref 26.0–34.0)
MCHC: 33.9 g/dL (ref 30.0–36.0)
MCV: 94.4 fL (ref 78.0–100.0)
PLATELETS: 431 10*3/uL — AB (ref 150–400)
RBC: 2.31 MIL/uL — ABNORMAL LOW (ref 4.22–5.81)
RDW: 19.2 % — AB (ref 11.5–15.5)
WBC: 9.2 10*3/uL (ref 4.0–10.5)

## 2013-09-30 LAB — BASIC METABOLIC PANEL
BUN: 11 mg/dL (ref 6–23)
CHLORIDE: 105 meq/L (ref 96–112)
CO2: 23 mEq/L (ref 19–32)
Calcium: 9.2 mg/dL (ref 8.4–10.5)
Creatinine, Ser: 0.66 mg/dL (ref 0.50–1.35)
GFR calc non Af Amer: >90 mL/min (ref >90)
Glucose, Bld: 144 mg/dL — ABNORMAL HIGH (ref 70–99)
POTASSIUM: 4.5 meq/L (ref 3.7–5.3)
Sodium: 141 mEq/L (ref 137–147)

## 2013-09-30 LAB — MAGNESIUM: MAGNESIUM: 2.2 mg/dL (ref 1.5–2.5)

## 2013-09-30 MED ORDER — HYDROMORPHONE HCL PF 1 MG/ML IJ SOLN
1.0000 mg | Freq: Once | INTRAMUSCULAR | Status: AC
  Administered 2013-09-30: 1 mg via INTRAVENOUS
  Filled 2013-09-30: qty 1

## 2013-09-30 MED ORDER — SODIUM CHLORIDE 0.9 % IJ SOLN
10.0000 mL | INTRAMUSCULAR | Status: DC | PRN
  Administered 2013-10-01 – 2013-10-03 (×4): 10 mL

## 2013-09-30 MED ORDER — SODIUM CHLORIDE 0.9 % IV SOLN
25.0000 mg | INTRAVENOUS | Status: DC | PRN
  Administered 2013-09-30 – 2013-10-02 (×5): 25 mg via INTRAVENOUS
  Filled 2013-09-30 (×6): qty 0.5

## 2013-09-30 NOTE — Progress Notes (Deleted)
CMT called stated pt had 19 beats of V-Tach, VSS--sitting on side of bed, complaining of abd pressure. SRP, RN

## 2013-09-30 NOTE — Progress Notes (Signed)
PROGRESS NOTE  Dennis Bernard QQP:[id number] DOB: 03-24-1986 DOA: 09/24/2013 PCP: Wilhelmina Mcardle, MD  Assessment/Plan: Sickle cell pain crisis  - continue IVF, pain control.  - Hb yesterday < 5 s/p 2U transfusion, 7.4 this morning. - appreciate Dr. Beryle Beams input - MR unrevealing.  - his Hb and breathing are better, encouraged patient to ambulate today.  Anemia - - 2U pRBC 1/16 Leukocytosis - severe, due to #1, improved.   Diet: regular Fluids: NS DVT Prophylaxis: SCD  Code Status: Full Family Communication: none  Disposition Plan: home when ready  Consultants:  Oncology  Procedures:  none   Antibiotics - none  HPI/Subjective: - rates his pain as 9/10 still. Breathing better.   Objective: Filed Vitals:   09/30/13 0352 09/30/13 0430 09/30/13 0530 09/30/13 0630  BP: 126/83 123/82 120/78 127/86  Pulse: 75 80 72 80  Temp: 98.7 F (37.1 C) 99.3 F (37.4 C) 98.6 F (37 C) 98.8 F (37.1 C)  TempSrc: Oral Oral Oral Oral  Resp: 18 18 18 18   Height:      Weight:      SpO2: 99% 99% 99% 100%    Intake/Output Summary (Last 24 hours) at 09/30/13 7124 Last data filed at 09/30/13 0630  Gross per 24 hour  Intake   2095 ml  Output   3500 ml  Net  -1405 ml   Filed Weights   09/24/13 2355  Weight: 80.151 kg (176 lb 11.2 oz)   Exam:  General:  NAD  Cardiovascular: regular rate and rhythm, without MRG  Respiratory: good air movement, clear to auscultation throughout, no wheezing, ronchi or rales  Abdomen: soft, not tender to palpation, positive bowel sounds  MSK: no peripheral edema  Neuro: non focal  Data Reviewed: Basic Metabolic Panel:  Recent Labs Lab 09/24/13 1820  NA 140  K 4.1  CL 106  CO2 23  GLUCOSE 79  BUN 11  CREATININE 0.82  CALCIUM 9.2   CBC:  Recent Labs Lab 09/24/13 1820 09/25/13 0635 09/27/13 1358 09/29/13 0555  WBC 14.3* 53.1* 12.2* 9.8  NEUTROABS 8.8* 48.3*  --   --   HGB 6.7* 5.4* 5.4* 4.9*  HCT 19.9* 15.9*  15.8* 14.1*  MCV 103.1* 102.6* 97.5 97.2  PLT 405* 302 363 379   Cardiac Enzymes:  Recent Labs Lab 09/25/13 0115  TROPONINI <0.30   Studies: Mr Lumbar Spine W Wo Contrast  09/28/2013   CLINICAL DATA:  Persistent low back pain.  Sickle cell crisis.  EXAM: MRI LUMBAR SPINE WITHOUT AND WITH CONTRAST  TECHNIQUE: Multiplanar and multiecho pulse sequences of the lumbar spine were obtained without and with intravenous contrast.  CONTRAST:  63mL MULTIHANCE GADOBENATE DIMEGLUMINE 529 MG/ML IV SOLN  COMPARISON:  Radiographs dated 08/23/2013 and 05/17/2013  FINDINGS: Normal appearing conus tip at T12-L1. The patient has slight periaortic adenopathy which is nonspecific but can be seen with sickle cell disease. The largest lymph node is 14 mm in diameter on image number 7 of series 7.  The discs from T11-12 through L5-S1 are normal. There are the typical deformities of the vertebra consistent with sickle cell disease. There is diffuse abnormal signal from the bone marrow, also typical for Sickle cell disease. The bladder is quite distended.  There is no spinal or foraminal stenosis or facet arthritis. There is a tiny ganglion cyst associated with the left facet joint at L4-5. This is not felt to be significant.  IMPRESSION: Changes throughout the visualized bones consistent with sickle cell  disease. No degenerative disc or joint disease in the lumbar spine. Slight nonspecific periaortic adenopathy but this can be seen with sickle cell disease.   Electronically Signed   By: Rozetta Nunnery M.D.   On: 09/28/2013 14:59    Scheduled Meds: . folic acid  1 mg Oral q morning - 10a  . HYDROmorphone  2 mg Oral Q6H  . hydroxyurea  500 mg Oral TID  . morphine  60 mg Oral BID  . multivitamin with minerals  1 tablet Oral Daily  . polyethylene glycol  17 g Oral BID   Continuous Infusions: . sodium chloride 50 mL/hr at 09/29/13 0454   Principal Problem:   Sickle cell pain crisis Active Problems:   Sickle cell  anemia   Chronic pain   Narcotic habituation, continuous   Non-ABO incompatible blood transfusion   Sickle cell anemia with crisis  Time spent: [place], MD [facility] Pager [phone]. If 7 PM - 7 AM, please contact night-coverage at [email or web address], password [facility] 09/30/2013, 7:22 AM  LOS: 6 days

## 2013-10-01 LAB — CBC
HCT: 18.7 % — ABNORMAL LOW (ref 39.0–52.0)
Hemoglobin: 6.4 g/dL — CL (ref 13.0–17.0)
MCH: 32.8 pg (ref 26.0–34.0)
MCHC: 34.2 g/dL (ref 30.0–36.0)
MCV: 95.9 fL (ref 78.0–100.0)
Platelets: 399 10*3/uL (ref 150–400)
RBC: 1.95 MIL/uL — AB (ref 4.22–5.81)
RDW: 19.6 % — ABNORMAL HIGH (ref 11.5–15.5)
WBC: 7.2 10*3/uL (ref 4.0–10.5)

## 2013-10-01 NOTE — Progress Notes (Signed)
HGB is 6.4 this AM.

## 2013-10-01 NOTE — Progress Notes (Signed)
PROGRESS NOTE  Dennis Bernard ZDG:[id number] DOB: 09/16/1985 DOA: 09/24/2013 PCP: Wilhelmina Mcardle, MD  Assessment/Plan: Sickle cell pain crisis  - continue IVF, pain control. Plan for increasing oral agents and decreasing IV tomorrow. S/p 2U pRBC on 1/16 - Hb trend below Lab Results  Component Value Date   HGB 6.4* 10/01/2013   HGB 7.4* 09/30/2013   HGB 4.9* 09/29/2013   HGB 5.4* 09/27/2013  - appreciate Dr. Beryle Beams input Anemia - - 2U pRBC 1/16 Leukocytosis - severe, due to #1, improved.   Diet: regular Fluids: NS DVT Prophylaxis: SCD  Code Status: Full Family Communication: none  Disposition Plan: home when ready  Consultants:  Oncology  Procedures:  none   Antibiotics - none  HPI/Subjective: - rates his pain as 9/10 still  Objective: Filed Vitals:   09/30/13 0730 09/30/13 1431 09/30/13 2135 10/01/13 0545  BP: 129/82 127/82 128/78 116/71  Pulse: 69 66 93 82  Temp: 98.6 F (37 C) 98.1 F (36.7 C) 98 F (36.7 C) 97.5 F (36.4 C)  TempSrc: Oral Oral Oral Oral  Resp: 18 16 18 18   Height:      Weight:      SpO2: 100% 100% 100% 100%    Intake/Output Summary (Last 24 hours) at 10/01/13 1409 Last data filed at 10/01/13 1224  Gross per 24 hour  Intake   2360 ml  Output   5500 ml  Net  -3140 ml   Filed Weights   09/24/13 2355  Weight: 80.151 kg (176 lb 11.2 oz)   Exam:  General:  NAD  Cardiovascular: regular rate and rhythm, without MRG  Respiratory: good air movement, clear to auscultation throughout, no wheezing, ronchi or rales  Abdomen: soft, not tender to palpation, positive bowel sounds  MSK: no peripheral edema  Neuro: non focal  Data Reviewed: Basic Metabolic Panel:  Recent Labs Lab 09/24/13 1820 09/30/13 0850  NA 140 141  K 4.1 4.5  CL 106 105  CO2 23 23  GLUCOSE 79 144*  BUN 11 11  CREATININE 0.82 0.66  CALCIUM 9.2 9.2  MG  --  2.2   CBC:  Recent Labs Lab 09/24/13 1820 09/25/13 0635 09/27/13 1358  09/29/13 0555 09/30/13 0850 10/01/13 0518  WBC 14.3* 53.1* 12.2* 9.8 9.2 7.2  NEUTROABS 8.8* 48.3*  --   --   --   --   HGB 6.7* 5.4* 5.4* 4.9* 7.4* 6.4*  HCT 19.9* 15.9* 15.8* 14.1* 21.8* 18.7*  MCV 103.1* 102.6* 97.5 97.2 94.4 95.9  PLT 405* 302 363 379 431* 399   Cardiac Enzymes:  Recent Labs Lab 09/25/13 0115  TROPONINI <0.30   Studies: No results found.  Scheduled Meds: . folic acid  1 mg Oral q morning - 10a  . HYDROmorphone  2 mg Oral Q6H  . hydroxyurea  500 mg Oral TID  . morphine  60 mg Oral BID  . multivitamin with minerals  1 tablet Oral Daily  . polyethylene glycol  17 g Oral BID   Continuous Infusions: . sodium chloride 50 mL/hr at 10/01/13 1224   Principal Problem:   Sickle cell pain crisis Active Problems:   Sickle cell anemia   Chronic pain   Narcotic habituation, continuous   Non-ABO incompatible blood transfusion   Sickle cell anemia with crisis  Time spent: [place], MD [facility] Pager [phone]. If 7 PM - 7 AM, please contact night-coverage at [email or web address], password [facility] 10/01/2013, 2:09 PM  LOS: 7 days

## 2013-10-02 LAB — CBC
HEMATOCRIT: 19.4 % — AB (ref 39.0–52.0)
HEMOGLOBIN: 6.6 g/dL — AB (ref 13.0–17.0)
MCH: 33 pg (ref 26.0–34.0)
MCHC: 34 g/dL (ref 30.0–36.0)
MCV: 97 fL (ref 78.0–100.0)
PLATELETS: 417 10*3/uL — AB (ref 150–400)
RBC: 2 MIL/uL — AB (ref 4.22–5.81)
RDW: 18.7 % — ABNORMAL HIGH (ref 11.5–15.5)
WBC: 8.4 10*3/uL (ref 4.0–10.5)

## 2013-10-02 MED ORDER — KETOROLAC TROMETHAMINE 30 MG/ML IJ SOLN
30.0000 mg | Freq: Once | INTRAMUSCULAR | Status: AC
  Administered 2013-10-02: 12:00:00 30 mg via INTRAVENOUS
  Filled 2013-10-02: qty 1

## 2013-10-02 MED ORDER — HYDROMORPHONE HCL 4 MG PO TABS
4.0000 mg | ORAL_TABLET | ORAL | Status: DC | PRN
  Administered 2013-10-02 – 2013-10-03 (×6): 4 mg via ORAL
  Filled 2013-10-02 (×6): qty 1

## 2013-10-02 MED ORDER — HYDROMORPHONE HCL PF 2 MG/ML IJ SOLN
1.5000 mg | Freq: Four times a day (QID) | INTRAMUSCULAR | Status: DC | PRN
  Administered 2013-10-02 – 2013-10-03 (×3): 1.5 mg via INTRAVENOUS
  Administered 2013-10-03: 2 mg via INTRAVENOUS
  Filled 2013-10-02 (×4): qty 1

## 2013-10-02 NOTE — Progress Notes (Signed)
   PROGRESS NOTE   Dennis Bernard HEN:[id number] DOB: 09-09-1986 DOA: 09/24/2013 PCP: Wilhelmina Mcardle, MD   Assessment/Plan:  Sickle cell pain crisis  - continue IVF, pain control. Increase oral dilaudid with tapering of IV pain medications. S/p 2U pRBC on 1/16, Hb stable.  - Hb trend below Lab Results  Component Value Date   HGB 6.6* 10/02/2013   HGB 6.4* 10/01/2013   HGB 7.4* 09/30/2013   HGB 4.9* 09/29/2013  - appreciate Dr. Beryle Beams input Anemia - - 2U pRBC 1/16 Leukocytosis - severe, due to #1, improved.   Diet: regular Fluids: NS DVT Prophylaxis: SCD  Code Status: Full Family Communication: none  Disposition Plan: home when ready  Consultants:  Oncology  Procedures:  none   Antibiotics - none  HPI/Subjective: - rates his pain as 9/10 still, back pain feels a bit worse this morning.   Objective: Filed Vitals:   09/30/13 2135 10/01/13 0545 10/01/13 2145 10/02/13 0434  BP: 128/78 116/71 122/71 117/72  Pulse: 93 82 102 98  Temp: 98 F (36.7 C) 97.5 F (36.4 C) 98.4 F (36.9 C) 99.2 F (37.3 C)  TempSrc: Oral Oral Oral Oral  Resp: 18 18 20 20   Height:      Weight:      SpO2: 100% 100% 100% 100%    Intake/Output Summary (Last 24 hours) at 10/02/13 1355 Last data filed at 10/02/13 1100  Gross per 24 hour  Intake 5486.67 ml  Output   4625 ml  Net 861.67 ml   Filed Weights   09/24/13 2355  Weight: 80.151 kg (176 lb 11.2 oz)   Exam:  General:  NAD  Cardiovascular: regular rate and rhythm, without MRG  Respiratory: good air movement, clear to auscultation throughout, no wheezing, ronchi or rales  Abdomen: soft, not tender to palpation, positive bowel sounds  MSK: no peripheral edema  Neuro: non focal  Data Reviewed: Basic Metabolic Panel:  Recent Labs Lab 09/30/13 0850  NA 141  K 4.5  CL 105  CO2 23  GLUCOSE 144*  BUN 11  CREATININE 0.66  CALCIUM 9.2  MG 2.2   CBC:  Recent Labs Lab 09/27/13 1358 09/29/13 0555  09/30/13 0850 10/01/13 0518 10/02/13 0638  WBC 12.2* 9.8 9.2 7.2 8.4  HGB 5.4* 4.9* 7.4* 6.4* 6.6*  HCT 15.8* 14.1* 21.8* 18.7* 19.4*  MCV 97.5 97.2 94.4 95.9 97.0  PLT 363 379 431* 399 417*   Cardiac Enzymes: No results found for this basename: CKTOTAL, CKMB, CKMBINDEX, TROPONINI,  in the last 168 hours Studies: No results found.  Scheduled Meds: . folic acid  1 mg Oral q morning - 10a  . hydroxyurea  500 mg Oral TID  . morphine  60 mg Oral BID  . multivitamin with minerals  1 tablet Oral Daily  . polyethylene glycol  17 g Oral BID   Continuous Infusions: . sodium chloride 50 mL/hr at 10/01/13 1224   Principal Problem:   Sickle cell pain crisis Active Problems:   Sickle cell anemia   Chronic pain   Narcotic habituation, continuous   Non-ABO incompatible blood transfusion   Sickle cell anemia with crisis  Time spent: [place], MD [facility] Pager [phone]. If 7 PM - 7 AM, please contact night-coverage at [email or web address], password [facility] 10/02/2013, 1:55 PM  LOS: 8 days

## 2013-10-02 NOTE — Progress Notes (Signed)
Reasonable response to 2 units of red cells given on January 16 rise in hemoglobin from 5 up to 7.4. 6.6 today. Increased energy and resolved chest tightness following transfusion. Persistent low back pain which is main area of symptoms from his sickle crisis. Exam is stable. Lungs are clear. 1+ ankle edema. No focal neurologic deficit. Impression: #1. 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 The strut has a long half-life. He has been on it for over 2 weeks. We can make any dose increase. I would increase to 1 g 3 times a day alternating with 500 mg 3 times a day.  We talked about the potential of doing an exchange transfusion in the near future when we are otherwise ready to start the program at [facility]. I will coordinate this with him after discharge. We discussed the logistics of needing a vascular catheter if we embark upon this treatment.  We've discussed the problem with receiving high doses of parenteral narcotics for long intervals of time in the hospital and the high potential for drug withdrawal symptoms when he gets discharged from the hospital. I have traditionally put my patients on a tapering narcotics schedule after discharge to try to avoid this. I recommend we do this in his case. I would consider starting methadone 10 mg 3 times a day at this time. Taper IV narcotics.

## 2013-10-03 LAB — CBC
HEMATOCRIT: 19.4 % — AB (ref 39.0–52.0)
HEMOGLOBIN: 6.6 g/dL — AB (ref 13.0–17.0)
MCH: 32.8 pg (ref 26.0–34.0)
MCHC: 34 g/dL (ref 30.0–36.0)
MCV: 96.5 fL (ref 78.0–100.0)
Platelets: 443 10*3/uL — ABNORMAL HIGH (ref 150–400)
RBC: 2.01 MIL/uL — ABNORMAL LOW (ref 4.22–5.81)
RDW: 16.8 % — ABNORMAL HIGH (ref 11.5–15.5)
WBC: 7 10*3/uL (ref 4.0–10.5)

## 2013-10-03 LAB — TYPE AND SCREEN
ABO/RH(D): AB POS
ANTIBODY SCREEN: POSITIVE
DAT, IgG: POSITIVE
UNIT DIVISION: 0
Unit division: 0
Unit division: 0

## 2013-10-03 MED ORDER — HYDROXYUREA 500 MG PO CAPS: 500.0000 mg | ORAL_CAPSULE | Freq: Three times a day (TID) | ORAL | Status: DC

## 2013-10-03 MED ORDER — HYDROMORPHONE HCL 4 MG PO TABS: 4.0000 mg | ORAL_TABLET | ORAL | Status: DC | PRN

## 2013-10-03 MED ORDER — OXYCODONE HCL 5 MG PO TABS: 5.0000 mg | ORAL_TABLET | ORAL | Status: DC | PRN

## 2013-10-03 MED ORDER — MORPHINE SULFATE ER 60 MG PO TBCR: 60.0000 mg | EXTENDED_RELEASE_TABLET | Freq: Two times a day (BID) | ORAL | Status: DC

## 2013-10-03 MED ORDER — HEPARIN SOD (PORK) LOCK FLUSH 100 UNIT/ML IV SOLN
500.0000 [IU] | INTRAVENOUS | Status: AC | PRN
  Administered 2013-10-03: 500 [IU]

## 2013-10-03 NOTE — Discharge Summary (Signed)
Physician Discharge Summary  Dennis Bernard [id number] DOB: [birth year] DOA: 09/24/2013  PCP: Wilhelmina Mcardle, MD  Admit date: 09/24/2013 Discharge date: 10/03/2013  Time spent: 35 minutes  Recommendations for Outpatient Follow-up:  1. Followup with Dr. Beryle Beams in 1 week 2. Followup with PCP in 1 week   Recommendations for primary care physician for things to follow:  Repeat CBC  Discharge Diagnoses:  Principal Problem:   Sickle cell pain crisis Active Problems:   Sickle cell anemia   Chronic pain   Narcotic habituation, continuous   Non-ABO incompatible blood transfusion   Sickle cell anemia with crisis  Discharge Condition: Stable  Diet recommendation: Regular  Filed Weights   09/24/13 2355  Weight: 80.151 kg (176 lb 11.2 oz)   History of present illness:  28 yo male with 2 days of leg and back pain worsening despite him taking his regular ms contin along with his po dilaudid every 4 hours (much more than he usually does). He came to ED for uncontrolled pain. Reports low grade temp. Wheezing occasionally, none now. No nasal congestion. No cough. No nasal congestion. No sore throat. No sick contacts. No n/v/d. He has received several rounds of 1mg  dilaudid iv in ED with no help. Also having some sob when he walks a lot.  Hospital Course:  Sickle cell pain crisis - patient was admitted to the hospital for pain control, IV hydration, and monitoring his anemia. He was initially on a PCA pump, was transitioned to IV dilaudid and eventually to oral as his sickle cell crisis has improved. He was experiencing back pain which is similar in location to his previous crisis however felt the day may be worse than in the past. Patient underwent an MRI of the L-spine which was positive for changes consistent with sickle cell disease without any acute findings. Dr. Beryle Beams follow patient while hospitalized, and given the frequency of his sickle cell crisis, he recommended  increasing his home Hydrea to take 500 mg 3 times a day alternating with 1 g 3 times a day. Is possible that the patient might benefit from doing exchange transfusions as there is a new program test will be started at [facility]. 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 This also may result in delayed hemolytic transfusion reactions and patient needs to be premedicated with Solu-Medrol 40 mg IV, Tylenol 650 mg oral, and Benadryl 50 mg oral prior to all his transfusions. During his hospitalization, patient underwent transfusion with 2 units of packed red blood cells when his hemoglobin reached 4.9 (his threshold for transfusion is a hemoglobin less than 5), with relatively appropriate response in his hemoglobin, which is stabilized at 6.6 for 2 days in a row prior to discharge.  Procedures:  None   Consultations:  Oncology  Discharge Exam: Filed Vitals:   10/02/13 1425 10/02/13 2047 10/03/13 0230 10/03/13 0551  BP: 101/62 117/68 121/67 119/72  Pulse: 76 78 87 85  Temp: 97.7 F (36.5 C) 98.3 F (36.8 C) 97.8 F (36.6 C) 97.8 F (36.6 C)  TempSrc: Oral Oral Oral Oral  Resp: 18 16 18 18   Height:      Weight:      SpO2: 100% 100% 100% 100%   General: No acute distress Cardiovascular: Regular rate and rhythm Respiratory: Clear to auscultation bilaterally  Discharge Instructions     Medication List  albuterol 108 (90 BASE) MCG/ACT inhaler  Commonly known as:  PROVENTIL HFA;VENTOLIN HFA  Inhale 1 puff into the lungs every 6 (six) hours as needed for wheezing or shortness of breath. wheezing     diphenhydrAMINE 25 MG tablet  Commonly known as:  BENADRYL  Take 1 tablet (25 mg total) by mouth every 8 (eight) hours as needed for itching.     folic acid 1 MG tablet  Commonly known as:  FOLVITE  Take 1 mg by mouth every morning.      HYDROmorphone 4 MG tablet  Commonly known as:  DILAUDID  Take 1 tablet (4 mg total) by mouth every 4 (four) hours as needed.     hydroxyurea 500 MG capsule  Commonly known as:  HYDREA  Take 1 capsule (500 mg total) by mouth 3 (three) times daily. May take with food to minimize GI side effects. Alternate 1000 mg three times daily with 500 mg three times daily.     morphine 60 MG 12 hr tablet  Commonly known as:  MS CONTIN  Take 1 tablet (60 mg total) by mouth 2 (two) times daily.     multivitamin with minerals Tabs tablet  Take 1 tablet by mouth daily.     oxyCODONE 5 MG immediate release tablet  Commonly known as:  Oxy IR/ROXICODONE  Take 1 tablet (5 mg total) by mouth every 4 (four) hours as needed for severe pain.     polyethylene glycol packet  Commonly known as:  MIRALAX / GLYCOLAX  Take 17 g by mouth 2 (two) times daily.     promethazine 25 MG tablet  Commonly known as:  PHENERGAN  Take 1 tablet (25 mg total) by mouth every 6 (six) hours as needed for nausea.     sennosides-docusate sodium 8.6-50 MG tablet  Commonly known as:  SENOKOT-S  Take 1 tablet by mouth 2 (two) times daily.           Follow-up Information   Follow up with FLEMING, RON D, MD. Schedule an appointment as soon as possible for a visit in 4 weeks.   Specialty:  Internal Medicine   Contact information:   [place] [address] [phone]       Follow up with Annia Belt, MD. Schedule an appointment as soon as possible for a visit in 1 week.   Specialty:  Oncology   Contact information:   [address] [place] [address] [phone]       The results of significant diagnostics from this hospitalization (including imaging, microbiology, ancillary and laboratory) are listed below for reference.    Significant Diagnostic Studies: Dg Chest 2 View  09/24/2013   CLINICAL DATA:  Cough and sickle cell anemia  EXAM: CHEST  2 VIEW  COMPARISON:  09/12/2013   FINDINGS: Cardiac shadow is stable. A right-sided chest wall port is again seen and stable. Postsurgical changes in the left shoulder are noted. No focal infiltrate is seen. No acute bony abnormality is noted.  IMPRESSION: No acute abnormality seen.   Electronically Signed   By: Inez Catalina M.D.   On: 09/24/2013 18:41   Dg Chest 2 View  09/04/2013   CLINICAL DATA:  Fever, sickle cell crisis.  EXAM: CHEST  2 VIEW  COMPARISON:  August 20, 2013.  FINDINGS: Interval placement of right-sided Port-A-Cath with distal tip in expected position of the SVC. Cardiomediastinal silhouette appears normal. Left shoulder arthroplasty is noted. No pneumothorax or pleural effusion  is noted. Stable interstitial densities are noted throughout both lungs. No acute pulmonary disease is noted.  IMPRESSION: Stable interstitial densities compared to prior exam. No acute cardiopulmonary abnormality seen.   Electronically Signed   By: Sabino Dick M.D.   On: 09/04/2013 11:26   Ct Angio Chest Pe W/cm &/or Wo Cm  09/24/2013   CLINICAL DATA:  Sickle cell crisis with chest pain and shortness of breath  EXAM: CT ANGIOGRAPHY CHEST WITH CONTRAST  TECHNIQUE: Multidetector CT imaging of the chest was performed using the standard protocol during bolus administration of intravenous contrast. Multiplanar CT image reconstructions including MIPs were obtained to evaluate the vascular anatomy.  CONTRAST:  113mL OMNIPAQUE IOHEXOL 350 MG/ML SOLN  COMPARISON:  Chest x-ray from earlier in the same day.  FINDINGS: The lungs are well aerated bilaterally without focal infiltrate or sizable effusion. The right-sided chest wall port is again seen. The thoracic aorta is within normal limits. The pulmonary artery shows no definitive filling defects. No hilar or mediastinal adenopathy is seen. 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  Scanning into the upper abdomen demonstrates chronic changes within the spleen consistent with the given clinical history. The  bony structures demonstrate a mottled appearance consistent with the underlying clinical history.  Review of the MIP images confirms the above findings.  IMPRESSION: No evidence of pulmonary emboli.  Chronic changes consistent with the underlying clinical disease process. No acute abnormality is noted.   Electronically Signed   By: Inez Catalina M.D.   On: 09/24/2013 20:18   Mr Lumbar Spine W Wo Contrast  09/28/2013   CLINICAL DATA:  Persistent low back pain.  Sickle cell crisis.  EXAM: MRI LUMBAR SPINE WITHOUT AND WITH CONTRAST  TECHNIQUE: Multiplanar and multiecho pulse sequences of the lumbar spine were obtained without and with intravenous contrast.  CONTRAST:  40mL MULTIHANCE GADOBENATE DIMEGLUMINE 529 MG/ML IV SOLN  COMPARISON:  Radiographs dated 08/23/2013 and 05/17/2013  FINDINGS: Normal appearing conus tip at T12-L1. The patient has slight periaortic adenopathy which is nonspecific but can be seen with sickle cell disease. The largest lymph node is 14 mm in diameter on image number 7 of series 7.  The discs from T11-12 through L5-S1 are normal. There are the typical deformities of the vertebra consistent with sickle cell disease. There is diffuse abnormal signal from the bone marrow, also typical for Sickle cell disease. The bladder is quite distended.  There is no spinal or foraminal stenosis or facet arthritis. There is a tiny ganglion cyst associated with the left facet joint at L4-5. This is not felt to be significant.  IMPRESSION: Changes throughout the visualized bones consistent with sickle cell disease. No degenerative disc or joint disease in the lumbar spine. Slight nonspecific periaortic adenopathy but this can be seen with sickle cell disease.   Electronically Signed   By: Rozetta Nunnery M.D.   On: 09/28/2013 14:59   Dg Chest Port 1 View  09/12/2013   CLINICAL DATA:  Fever.  Sickle cell patient.  EXAM: PORTABLE CHEST - 1 VIEW  COMPARISON:  09/04/2013  FINDINGS: Shallow inspiration. Normal  heart size and pulmonary vascularity. Central interstitial changes similar to previous study. No focal airspace disease or consolidation. No blunting of costophrenic angles. No pneumothorax. Stable appearance of right central venous catheter. Postoperative changes in the left shoulder. Sclerosis in the right humeral head may represent bone infarct. Go  IMPRESSION: No active disease.   Electronically Signed   By: Lucienne Capers  M.D.   On: 09/12/2013 01:30   Labs: Basic Metabolic Panel:  Recent Labs Lab 09/30/13 0850  NA 141  K 4.5  CL 105  CO2 23  GLUCOSE 144*  BUN 11  CREATININE 0.66  CALCIUM 9.2  MG 2.2   CBC:  Recent Labs Lab 09/29/13 0555 09/30/13 0850 10/01/13 0518 10/02/13 0638 10/03/13 0800  WBC 9.8 9.2 7.2 8.4 7.0  HGB 4.9* 7.4* 6.4* 6.6* 6.6*  HCT 14.1* 21.8* 18.7* 19.4* 19.4*  MCV 97.2 94.4 95.9 97.0 96.5  PLT 379 431* 399 417* 443*    Signed:  Ameliarose Shark  [facility] 10/03/2013, 6:09 PM

## 2013-10-03 NOTE — Discharge Instructions (Signed)
Please take Hydrea alternatively as 500 mg three times daily with 1000 mg three times daily.   You were cared for by a hospitalist during your hospital stay. If you have any questions about your discharge medications or the care you received while you were in the hospital after you are discharged, you can call the unit and asked to speak with the hospitalist on call if the hospitalist that took care of you is not available. Once you are discharged, your primary care physician will handle any further medical issues. Please note that NO REFILLS for any discharge medications will be authorized once you are discharged, as it is imperative that you return to your primary care physician (or establish a relationship with a primary care physician if you do not have one) for your aftercare needs so that they can reassess your need for medications and monitor your lab values.     If you do not have a primary care physician, you can call [phone] for a physician referral.  Follow with Primary MD Raul Del, RON D, MD  Follow up with Dr. Beryle Beams in 1 week  Get CBC, CMP checked by your doctor and again as further instructed.  Get a 2 view Chest X ray done next visit if you had Pneumonia of Lung problems at the [facility] reviewed and adjusted.  Please request your Prim.MD to go over all Hospital Tests and Procedure/Radiological results at the follow up, please get all Hospital records sent to your Prim MD by signing hospital release before you go home.  Activity: As tolerated with Full fall precautions use walker/cane & assistance as needed  Diet: regular  For Heart failure patients - Check your Weight same time everyday, if you gain over 2 pounds, or you develop in leg swelling, experience more shortness of breath or chest pain, call your Primary MD immediately. Follow Cardiac Low Salt Diet and 1.8 lit/day fluid restriction.  Disposition Home  If you experience worsening of your admission  symptoms, develop shortness of breath, life threatening emergency, suicidal or homicidal thoughts you must seek medical attention immediately by calling 911 or calling your MD immediately  if symptoms less severe.  You Must read complete instructions/literature along with all the possible adverse reactions/side effects for all the Medicines you take and that have been prescribed to you. Take any new Medicines after you have completely understood and accpet all the possible adverse reactions/side effects.   Do not drive and provide baby sitting services if your were admitted for syncope or siezures until you have seen by Primary MD or a Neurologist and advised to do so again.  Do not drive when taking Pain medications.   Do not take more than prescribed Pain, Sleep and Anxiety Medications  Special Instructions: If you have smoked or chewed Tobacco  in the last 2 yrs please stop smoking, stop any regular Alcohol  and or any Recreational drug use.  Wear Seat belts while driving.

## 2013-10-20 ENCOUNTER — Inpatient Hospital Stay (HOSPITAL_COMMUNITY)
Admission: EM | Admit: 2013-10-20 | Discharge: 2013-10-25 | DRG: 812 | Disposition: A | Discharge status: Home / Self Care | Payer: Medicare Other | Attending: Internal Medicine | Admitting: Internal Medicine

## 2013-10-20 ENCOUNTER — Encounter (HOSPITAL_COMMUNITY): Payer: Self-pay | Admitting: Emergency Medicine

## 2013-10-20 ENCOUNTER — Emergency Department (HOSPITAL_COMMUNITY): Payer: Medicare Other

## 2013-10-20 DIAGNOSIS — Z96619 Presence of unspecified artificial shoulder joint: Secondary | ICD-10-CM

## 2013-10-20 DIAGNOSIS — L819 Disorder of pigmentation, unspecified: Secondary | ICD-10-CM | POA: Diagnosis present

## 2013-10-20 DIAGNOSIS — D5700 Hb-SS disease with crisis, unspecified: Principal | ICD-10-CM | POA: Diagnosis present

## 2013-10-20 DIAGNOSIS — G8929 Other chronic pain: Secondary | ICD-10-CM

## 2013-10-20 DIAGNOSIS — D72829 Elevated white blood cell count, unspecified: Secondary | ICD-10-CM | POA: Diagnosis present

## 2013-10-20 DIAGNOSIS — M549 Dorsalgia, unspecified: Secondary | ICD-10-CM

## 2013-10-20 DIAGNOSIS — F1191 Opioid use, unspecified, in remission: Secondary | ICD-10-CM

## 2013-10-20 DIAGNOSIS — R0789 Other chest pain: Secondary | ICD-10-CM

## 2013-10-20 DIAGNOSIS — Z832 Family history of diseases of the blood and blood-forming organs and certain disorders involving the immune mechanism: Secondary | ICD-10-CM

## 2013-10-20 DIAGNOSIS — K5900 Constipation, unspecified: Secondary | ICD-10-CM | POA: Diagnosis present

## 2013-10-20 DIAGNOSIS — S62605A Fracture of unspecified phalanx of left ring finger, initial encounter for closed fracture: Secondary | ICD-10-CM

## 2013-10-20 DIAGNOSIS — R21 Rash and other nonspecific skin eruption: Secondary | ICD-10-CM | POA: Diagnosis present

## 2013-10-20 DIAGNOSIS — M7989 Other specified soft tissue disorders: Secondary | ICD-10-CM | POA: Diagnosis present

## 2013-10-20 DIAGNOSIS — T451X5A Adverse effect of antineoplastic and immunosuppressive drugs, initial encounter: Secondary | ICD-10-CM | POA: Diagnosis present

## 2013-10-20 DIAGNOSIS — Z87898 Personal history of other specified conditions: Secondary | ICD-10-CM

## 2013-10-20 DIAGNOSIS — Z79899 Other long term (current) drug therapy: Secondary | ICD-10-CM

## 2013-10-20 DIAGNOSIS — K5909 Other constipation: Secondary | ICD-10-CM

## 2013-10-20 DIAGNOSIS — M87059 Idiopathic aseptic necrosis of unspecified femur: Secondary | ICD-10-CM | POA: Diagnosis present

## 2013-10-20 DIAGNOSIS — R609 Edema, unspecified: Secondary | ICD-10-CM | POA: Diagnosis present

## 2013-10-20 DIAGNOSIS — G894 Chronic pain syndrome: Secondary | ICD-10-CM | POA: Diagnosis present

## 2013-10-20 DIAGNOSIS — R600 Localized edema: Secondary | ICD-10-CM | POA: Diagnosis present

## 2013-10-20 DIAGNOSIS — R700 Elevated erythrocyte sedimentation rate: Secondary | ICD-10-CM | POA: Diagnosis present

## 2013-10-20 LAB — CBC WITH DIFFERENTIAL/PLATELET
BASOS ABS: 0 10*3/uL (ref 0.0–0.1)
Basophils Relative: 0 % (ref 0–1)
EOS PCT: 1 % (ref 0–5)
Eosinophils Absolute: 0.1 10*3/uL (ref 0.0–0.7)
HCT: 22.2 % — ABNORMAL LOW (ref 39.0–52.0)
Hemoglobin: 7.2 g/dL — ABNORMAL LOW (ref 13.0–17.0)
LYMPHS PCT: 30 % (ref 12–46)
Lymphs Abs: 3.8 10*3/uL (ref 0.7–4.0)
MCH: 32.6 pg (ref 26.0–34.0)
MCHC: 32.4 g/dL (ref 30.0–36.0)
MCV: 100.5 fL — ABNORMAL HIGH (ref 78.0–100.0)
MONOS PCT: 9 % (ref 3–12)
Monocytes Absolute: 1.1 10*3/uL — ABNORMAL HIGH (ref 0.1–1.0)
NEUTROS PCT: 60 % (ref 43–77)
Neutro Abs: 7.7 10*3/uL (ref 1.7–7.7)
PLATELETS: 503 10*3/uL — AB (ref 150–400)
RBC: 2.21 MIL/uL — AB (ref 4.22–5.81)
RDW: 24.1 % — ABNORMAL HIGH (ref 11.5–15.5)
WBC: 12.7 10*3/uL — AB (ref 4.0–10.5)
nRBC: 27 /100 WBC — ABNORMAL HIGH

## 2013-10-20 LAB — COMPREHENSIVE METABOLIC PANEL
ALBUMIN: 3.7 g/dL (ref 3.5–5.2)
ALK PHOS: 101 U/L (ref 39–117)
ALT: 20 U/L (ref 0–53)
AST: 56 U/L — ABNORMAL HIGH (ref 0–37)
BILIRUBIN TOTAL: 2.3 mg/dL — AB (ref 0.3–1.2)
BUN: 8 mg/dL (ref 6–23)
CHLORIDE: 98 meq/L (ref 96–112)
CO2: 28 mEq/L (ref 19–32)
CREATININE: 0.59 mg/dL (ref 0.50–1.35)
Calcium: 9.5 mg/dL (ref 8.4–10.5)
GFR calc Af Amer: >90 mL/min (ref >90)
GFR calc non Af Amer: >90 mL/min (ref >90)
Glucose, Bld: 93 mg/dL (ref 70–99)
POTASSIUM: 4 meq/L (ref 3.7–5.3)
Sodium: 139 mEq/L (ref 137–147)
Total Protein: 8.3 g/dL (ref 6.0–8.3)

## 2013-10-20 LAB — RETICULOCYTES
RBC.: 2.21 MIL/uL — AB (ref 4.22–5.81)
RETIC COUNT ABSOLUTE: 450.8 10*3/uL — AB (ref 19.0–186.0)
RETIC CT PCT: 20.4 % — AB (ref 0.4–3.1)

## 2013-10-20 LAB — URINALYSIS W MICROSCOPIC + REFLEX CULTURE
Bilirubin Urine: NEGATIVE
GLUCOSE, UA: NEGATIVE mg/dL
HGB URINE DIPSTICK: NEGATIVE
Ketones, ur: NEGATIVE mg/dL
Leukocytes, UA: NEGATIVE
Nitrite: NEGATIVE
PROTEIN: NEGATIVE mg/dL
Specific Gravity, Urine: 1.009 (ref 1.005–1.030)
URINE-OTHER: NONE SEEN
Urobilinogen, UA: 1 mg/dL (ref 0.0–1.0)
pH: 8 (ref 5.0–8.0)

## 2013-10-20 LAB — PRO B NATRIURETIC PEPTIDE: Pro B Natriuretic peptide (BNP): 471.8 pg/mL — ABNORMAL HIGH (ref 0–125)

## 2013-10-20 MED ORDER — FOLIC ACID 1 MG PO TABS: 1.0000 mg | ORAL_TABLET | Freq: Every morning | ORAL | Status: DC

## 2013-10-20 MED ORDER — SENNOSIDES-DOCUSATE SODIUM 8.6-50 MG PO TABS
1.0000 | ORAL_TABLET | Freq: Two times a day (BID) | ORAL | Status: DC
  Administered 2013-10-21 – 2013-10-25 (×10): 1 via ORAL
  Filled 2013-10-20 (×9): qty 1

## 2013-10-20 MED ORDER — HYDROMORPHONE HCL PF 2 MG/ML IJ SOLN
2.0000 mg | Freq: Once | INTRAMUSCULAR | Status: AC
  Administered 2013-10-20: 2 mg via INTRAVENOUS
  Filled 2013-10-20: qty 1

## 2013-10-20 MED ORDER — DIPHENHYDRAMINE HCL 25 MG PO CAPS: 25.0000 mg | ORAL_CAPSULE | Freq: Three times a day (TID) | ORAL | Status: DC | PRN

## 2013-10-20 MED ORDER — DIPHENHYDRAMINE HCL 50 MG/ML IJ SOLN
25.0000 mg | Freq: Once | INTRAMUSCULAR | Status: AC
  Administered 2013-10-20: 25 mg via INTRAVENOUS
  Filled 2013-10-20: qty 1

## 2013-10-20 MED ORDER — HEPARIN SODIUM (PORCINE) 5000 UNIT/ML IJ SOLN
5000.0000 [IU] | Freq: Three times a day (TID) | INTRAMUSCULAR | Status: DC
  Administered 2013-10-21 – 2013-10-25 (×13): 5000 [IU] via SUBCUTANEOUS
  Filled 2013-10-20 (×16): qty 1

## 2013-10-20 MED ORDER — SODIUM CHLORIDE 0.9 % IJ SOLN: 9.0000 mL | INTRAMUSCULAR | Status: DC | PRN

## 2013-10-20 MED ORDER — MORPHINE SULFATE ER 30 MG PO TBCR
60.0000 mg | EXTENDED_RELEASE_TABLET | Freq: Two times a day (BID) | ORAL | Status: DC
  Administered 2013-10-21 – 2013-10-25 (×10): 60 mg via ORAL
  Filled 2013-10-20 (×11): qty 2

## 2013-10-20 MED ORDER — HYDROMORPHONE 0.3 MG/ML IV SOLN
INTRAVENOUS | Status: DC
  Administered 2013-10-21: 6.39 mg via INTRAVENOUS
  Administered 2013-10-21: 02:00:00 via INTRAVENOUS
  Administered 2013-10-21 (×2): 4.79 mg via INTRAVENOUS
  Filled 2013-10-20 (×2): qty 25

## 2013-10-20 MED ORDER — ONDANSETRON HCL 4 MG/2ML IJ SOLN
4.0000 mg | Freq: Once | INTRAMUSCULAR | Status: AC
  Administered 2013-10-20: 4 mg via INTRAVENOUS
  Filled 2013-10-20: qty 2

## 2013-10-20 MED ORDER — ONDANSETRON HCL 4 MG/2ML IJ SOLN: 4.0000 mg | Freq: Four times a day (QID) | INTRAMUSCULAR | Status: DC | PRN

## 2013-10-20 MED ORDER — ADULT MULTIVITAMIN W/MINERALS CH
1.0000 | ORAL_TABLET | Freq: Every day | ORAL | Status: DC
  Administered 2013-10-21 – 2013-10-25 (×5): 1 via ORAL
  Filled 2013-10-20 (×5): qty 1

## 2013-10-20 MED ORDER — SODIUM CHLORIDE 0.9 % IV SOLN
Freq: Once | INTRAVENOUS | Status: AC
  Administered 2013-10-20: 1000 mL/h via INTRAVENOUS

## 2013-10-20 MED ORDER — ALBUTEROL SULFATE HFA 108 (90 BASE) MCG/ACT IN AERS: 1.0000 | INHALATION_SPRAY | Freq: Four times a day (QID) | RESPIRATORY_TRACT | Status: DC | PRN

## 2013-10-20 MED ORDER — ACETAMINOPHEN 325 MG PO TABS
650.0000 mg | ORAL_TABLET | Freq: Once | ORAL | Status: AC
  Administered 2013-10-20: 650 mg via ORAL
  Filled 2013-10-20: qty 2

## 2013-10-20 MED ORDER — HYDROXYUREA 500 MG PO CAPS: 500.0000 mg | ORAL_CAPSULE | Freq: Three times a day (TID) | ORAL | Status: DC

## 2013-10-20 MED ORDER — NALOXONE HCL 0.4 MG/ML IJ SOLN: 0.4000 mg | INTRAMUSCULAR | Status: DC | PRN

## 2013-10-20 MED ORDER — FOLIC ACID 1 MG PO TABS
1.0000 mg | ORAL_TABLET | Freq: Every day | ORAL | Status: DC
  Administered 2013-10-21 – 2013-10-25 (×5): 1 mg via ORAL
  Filled 2013-10-20 (×6): qty 1

## 2013-10-20 MED ORDER — POLYETHYLENE GLYCOL 3350 17 G PO PACK
17.0000 g | PACK | Freq: Two times a day (BID) | ORAL | Status: DC
  Administered 2013-10-21 – 2013-10-25 (×9): 17 g via ORAL
  Filled 2013-10-20 (×10): qty 1

## 2013-10-20 MED ORDER — PROMETHAZINE HCL 25 MG PO TABS: 25.0000 mg | ORAL_TABLET | Freq: Four times a day (QID) | ORAL | Status: DC | PRN

## 2013-10-20 MED ORDER — KETOROLAC TROMETHAMINE 30 MG/ML IJ SOLN
30.0000 mg | Freq: Four times a day (QID) | INTRAMUSCULAR | Status: DC
  Administered 2013-10-21 – 2013-10-25 (×18): 30 mg via INTRAVENOUS
  Filled 2013-10-20 (×20): qty 1

## 2013-10-20 MED ORDER — DIPHENHYDRAMINE HCL 12.5 MG/5ML PO ELIX: 12.5000 mg | ORAL_SOLUTION | Freq: Four times a day (QID) | ORAL | Status: DC | PRN

## 2013-10-20 MED ORDER — DIPHENHYDRAMINE HCL 50 MG/ML IJ SOLN: 12.5000 mg | Freq: Four times a day (QID) | INTRAMUSCULAR | Status: DC | PRN

## 2013-10-20 NOTE — ED Provider Notes (Signed)
CSN: [id number]     Arrival date & time 10/20/13  1757 History   First MD Initiated Contact with Patient 10/20/13 1855     Chief Complaint  Patient presents with  . Sickle Cell Pain Crisis  . Leg Swelling  . Rash   (Consider location/radiation/quality/duration/timing/severity/associated sxs/prior Treatment) Patient is a 28 y.o. male presenting with sickle cell pain and rash. The history is provided by the patient.  Sickle Cell Pain Crisis Location:  Lower extremity and back Severity:  Moderate Onset quality:  Gradual Duration:  2 days Similar to previous crisis episodes: yes   Timing:  Constant Progression:  Unchanged Chronicity:  New Context: not change in medication   Relieved by:  Nothing Worsened by:  Nothing tried Ineffective treatments: oxycontin, dilaudid. Associated symptoms: nausea   Associated symptoms: no chest pain, no cough, no fever, no headaches, no shortness of breath and no vomiting   Rash Associated symptoms: nausea   Associated symptoms: no abdominal pain, no diarrhea, no fever, no headaches, no shortness of breath and not vomiting     Past Medical History  Diagnosis Date  . Sickle cell disease   . Avascular necrosis of femur head, right   . H/O allergic rhinitis   . H/O hypokalemia   . Chronic pain syndrome   . H/O wheezing     with colds  . 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 Cholecystectomy  1995  . Portacath placement Left 07/04/2012  . Exchange transfusion  02/2008    perioperatively for shoulder surgery  . 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 Portacath placement Right November 2014   Family History  Problem Relation Age of Onset  . Sickle cell anemia Brother   . Sickle cell trait Father   . Sickle cell trait Mother   . Diabetes Mellitus II Mother   . Sickle cell anemia Paternal Uncle   . Asthma Neg Hx   . Allergic rhinitis Neg Hx    History  Substance Use  Topics  . Smoking status: Never Smoker   . Smokeless tobacco: Never Used  . Alcohol Use: No    Review of Systems  Constitutional: Negative for fever.  HENT: Negative for drooling and rhinorrhea.   Eyes: Negative for pain.  Respiratory: Positive for chest tightness (intermittent). Negative for cough and shortness of breath.   Cardiovascular: Negative for chest pain and leg swelling.  Gastrointestinal: Positive for nausea. Negative for vomiting, abdominal pain and diarrhea.  Genitourinary: Negative for dysuria and hematuria.  Musculoskeletal: Positive for back pain. Negative for gait problem and neck pain.       Edema in LE's  Skin: Positive for rash. Negative for color change.  Neurological: Negative for numbness and headaches.  Hematological: Negative for adenopathy.  Psychiatric/Behavioral: Negative for behavioral problems.  All other systems reviewed and are negative.    Allergies  Review of patient's allergies indicates no known allergies.  Home Medications   Current Outpatient Rx  Name  Route  Sig  Dispense  Refill  . albuterol (PROVENTIL HFA;VENTOLIN HFA) 108 (90 BASE) MCG/ACT inhaler   Inhalation   Inhale 1 puff into the lungs every 6 (six) hours as needed for wheezing or shortness of breath. wheezing   1 Inhaler   1   . diphenhydrAMINE (BENADRYL) 25 MG tablet   Oral   Take 1 tablet (25 mg total) by mouth every 8 (eight) hours as needed for itching.  60 tablet   0   . folic acid (FOLVITE) 1 MG tablet   Oral   Take 1 mg by mouth every morning.         Marland Kitchen HYDROmorphone (DILAUDID) 4 MG tablet   Oral   Take 1 tablet (4 mg total) by mouth every 4 (four) hours as needed.   60 tablet   0   . hydroxyurea (HYDREA) 500 MG capsule   Oral   Take 500-1,000 mg by mouth 3 (three) times daily. May take with food to minimize GI side effects. Take 500mg  three times per day, alternating with 1000mg  three times per day         . morphine (MS CONTIN) 60 MG 12 hr tablet    Oral   Take 1 tablet (60 mg total) by mouth 2 (two) times daily.   20 tablet   0   . Multiple Vitamin (MULTIVITAMIN WITH MINERALS) TABS tablet   Oral   Take 1 tablet by mouth daily.   30 tablet   0   . oxyCODONE (OXY IR/ROXICODONE) 5 MG immediate release tablet   Oral   Take 5 mg by mouth every 4 (four) hours as needed for moderate pain or severe pain.         . polyethylene glycol (MIRALAX / GLYCOLAX) packet   Oral   Take 17 g by mouth 2 (two) times daily.   30 each   0   . promethazine (PHENERGAN) 25 MG tablet   Oral   Take 1 tablet (25 mg total) by mouth every 6 (six) hours as needed for nausea.   60 tablet   0   . sennosides-docusate sodium (SENOKOT-S) 8.6-50 MG tablet   Oral   Take 1 tablet by mouth 2 (two) times daily.          BP 140/92  Pulse 113  Temp(Src) 98.2 F (36.8 C) (Oral)  Resp 20  SpO2 96% Physical Exam  Nursing note and vitals reviewed. Constitutional: He is oriented to person, place, and time. He appears well-developed and well-nourished.  HENT:  Head: Normocephalic and atraumatic.  Right Ear: External ear normal.  Left Ear: External ear normal.  Nose: Nose normal.  Mouth/Throat: Oropharynx is clear and moist. No oropharyngeal exudate.  Eyes: Conjunctivae and EOM are normal. Pupils are equal, round, and reactive to light.  Neck: Normal range of motion. Neck supple.  Cardiovascular: Normal rate, regular rhythm, normal heart sounds and intact distal pulses.  Exam reveals no gallop and no friction rub.   No murmur heard. Pulmonary/Chest: Effort normal and breath sounds normal. No respiratory distress. He has no wheezes.  Abdominal: Soft. Bowel sounds are normal. He exhibits no distension. There is no tenderness. There is no rebound and no guarding.  Musculoskeletal: Normal range of motion. He exhibits edema (mild pitting edema in bilateral distal lower extremities.). He exhibits no tenderness.  Neurological: He is alert and oriented to  person, place, and time.  Skin: Skin is warm and dry. Rash (Multiple mild punctate excoriated lesions noted on the dorsal aspect of bilateral wrists and hands.) noted.  A 1.5 cm erythematous lesion with a central black eschar is noted on the right lateral elbow.  Psychiatric: He has a normal mood and affect. His behavior is normal.    ED Course  Procedures (including critical care time) Labs Review Labs Reviewed  CBC WITH DIFFERENTIAL - Abnormal; Notable for the following:    WBC 12.7 (*)    RBC  2.21 (*)    Hemoglobin 7.2 (*)    HCT 22.2 (*)    MCV 100.5 (*)    RDW 24.1 (*)    Platelets 503 (*)    nRBC 27 (*)    Monocytes Absolute 1.1 (*)    All other components within normal limits  COMPREHENSIVE METABOLIC PANEL - Abnormal; Notable for the following:    AST 56 (*)    Total Bilirubin 2.3 (*)    All other components within normal limits  PRO B NATRIURETIC PEPTIDE - Abnormal; Notable for the following:    Pro B Natriuretic peptide (BNP) 471.8 (*)    All other components within normal limits  RETICULOCYTES - Abnormal; Notable for the following:    Retic Ct Pct 20.4 (*)    RBC. 2.21 (*)    Retic Count, Manual 450.8 (*)    All other components within normal limits  SEDIMENTATION RATE - Abnormal; Notable for the following:    Sed Rate 139 (*)    All other components within normal limits  URINALYSIS W MICROSCOPIC + REFLEX CULTURE  C-REACTIVE PROTEIN   Imaging Review Dg Chest 2 View  10/20/2013   CLINICAL DATA:  28 year old male with chest pain. History of sickle cell disease.  EXAM: CHEST  2 VIEW  COMPARISON:  09/24/2013 chest radiograph and chest CT, and multiple prior chest radiographs.  FINDINGS: Mild cardiomegaly and increased vascularity identified.  Mild interstitial prominence is unchanged.  There is no evidence of focal airspace disease, pulmonary edema, suspicious pulmonary nodule/mass, pleural effusion, or pneumothorax.  No acute bony abnormalities are identified.  A right  Port-A-Cath and left shoulder hemiarthroplasty changes again noted.  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  IMPRESSION: Mild cardiomegaly without evidence of acute cardiopulmonary disease.   Electronically Signed   By: Hassan Rowan M.D.   On: 10/20/2013 20:17        MDM   1. Sickle cell pain crisis   2. Peripheral edema   3. Rash   4. Backache   5. Chronic constipation    7:17 PM 28 y.o. male who presents with sickle cell pain crisis which began 2 days ago. Patient complains of right thigh pain and low back pain. He notes temperature of 100.8 last night. He is also had some mild congestion and a rash on the dorsal surface of his wrists. He is afebrile and tachycardic here, his vital signs are otherwise unremarkable. He notes some intermittent chest tightness but denies any shortness of breath. He is also noticed mild pitting edema in his bilateral lower extremitites in the last 2 days. Will get screening labs and pain control.  Will admit to [facility].     Blanchard Kelch, MD 10/21/13 1105

## 2013-10-20 NOTE — ED Notes (Addendum)
Pt reports sickle cell hx, pain in back and legs, reports nausea and vomiting, denies diarrhea pt has 1+bilaterally pitting leg edema, has dry/scaley/psoriasis resembling rash on the back both hands all of which are new symptoms to his condition.

## 2013-10-20 NOTE — H&P (Signed)
[facility] History and Physical  [name] HYQ:[id number] DOB: October 25, 1985 DOA: 10/20/2013  Referring physician: EDP PCP: Wilhelmina Mcardle, MD   Chief Complaint: Sickle cell crisis, leg edema, rash   HPI: Dennis Bernard is a 28 y.o. male who presents to the ED with sickle cell pain crisis (typical symptoms of this) as well as a new rash and peripheral edema (has never had this before).  With regards to his sickle cell crisis, his pain is located in BLE and back, onset 2 days ago, not controlled with home meds, and essentially the same as previous crisis pains.  His rash and leg swelling has been present for the past 3 days, constant.  Rash is located on the dorsal aspect of the patients wrists bilaterally.  Review of Systems: Systems reviewed.  As above, otherwise negative  Past Medical History  Diagnosis Date  . Sickle cell disease   . Avascular necrosis of femur head, right   . H/O allergic rhinitis   . H/O hypokalemia   . Chronic pain syndrome   . H/O wheezing     with colds  . 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 Cholecystectomy  1995  . Portacath placement Left 07/04/2012  . Exchange transfusion  02/2008    perioperatively for shoulder surgery  . 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 Portacath placement Right November 2014   Social History:  reports that he has never smoked. He has never used smokeless tobacco. He reports that he does not drink alcohol or use illicit drugs.  No Known Allergies  Family History  Problem Relation Age of Onset  . Sickle cell anemia Brother   . Sickle cell trait Father   . Sickle cell trait Mother   . Diabetes Mellitus II Mother   . Sickle cell anemia Paternal Uncle   . Asthma Neg Hx   . Allergic rhinitis Neg Hx      Prior to Admission medications   Medication Sig Start Date End Date Taking? Authorizing Provider  albuterol (PROVENTIL  HFA;VENTOLIN HFA) 108 (90 BASE) MCG/ACT inhaler Inhale 1 puff into the lungs every 6 (six) hours as needed for wheezing or shortness of breath. wheezing 11/27/12  Yes Barton Dubois, MD  diphenhydrAMINE (BENADRYL) 25 MG tablet Take 1 tablet (25 mg total) by mouth every 8 (eight) hours as needed for itching. 07/29/13  Yes Robbie Lis, MD  folic acid (FOLVITE) 1 MG tablet Take 1 mg by mouth every morning. 11/27/12  Yes Barton Dubois, MD  HYDROmorphone (DILAUDID) 4 MG tablet Take 1 tablet (4 mg total) by mouth every 4 (four) hours as needed. 10/03/13  Yes Costin Karlyne Greenspan, MD  hydroxyurea (HYDREA) 500 MG capsule Take 500-1,000 mg by mouth 3 (three) times daily. May take with food to minimize GI side effects. Take 500mg  three times per day, alternating with 1000mg  three times per day   Yes Historical Provider, MD  morphine (MS CONTIN) 60 MG 12 hr tablet Take 1 tablet (60 mg total) by mouth 2 (two) times daily. 10/03/13  Yes Costin Karlyne Greenspan, MD  Multiple Vitamin (MULTIVITAMIN WITH MINERALS) TABS tablet Take 1 tablet by mouth daily. 09/15/13  Yes Robbie Lis, MD  oxyCODONE (OXY IR/ROXICODONE) 5 MG immediate release tablet Take 5 mg by mouth every 4 (four) hours as needed for moderate pain or severe pain.   Yes Historical Provider, MD  polyethylene glycol (MIRALAX / GLYCOLAX) packet Take 17  g by mouth 2 (two) times daily. 08/28/13  Yes Adeline Saralyn Pilar, MD  promethazine (PHENERGAN) 25 MG tablet Take 1 tablet (25 mg total) by mouth every 6 (six) hours as needed for nausea. 09/15/13  Yes Robbie Lis, MD  sennosides-docusate sodium (SENOKOT-S) 8.6-50 MG tablet Take 1 tablet by mouth 2 (two) times daily. 02/14/13  Yes Leana Gamer, MD   Physical Exam: Filed Vitals:   10/20/13 2248  BP: 130/95  Pulse: 78  Temp:   Resp: 16    BP 130/95  Pulse 78  Temp(Src) 98.2 F (36.8 C) (Oral)  Resp 16  SpO2 99%  General Appearance:    Alert, oriented, no distress, appears stated age  Head:    Normocephalic,  atraumatic  Eyes:    PERRL, EOMI, sclera non-icteric        Nose:   Nares without drainage or epistaxis. Mucosa, turbinates normal  Throat:   Moist mucous membranes. Oropharynx without erythema or exudate.  Neck:   Supple. No carotid bruits.  No thyromegaly.  No lymphadenopathy.   Back:     No CVA tenderness, no spinal tenderness  Lungs:     Clear to auscultation bilaterally, without wheezes, rhonchi or rales  Chest wall:    No tenderness to palpitation  Heart:    Regular rate and rhythm without murmurs, gallops, rubs  Abdomen:     Soft, non-tender, nondistended, normal bowel sounds, no organomegaly  Genitalia:    deferred  Rectal:    deferred  Extremities:   2+ pitting edema BLE  Pulses:   2+ and symmetric all extremities  Skin:   There is a rash, multiple punctate excoriated lesions noted on dorsal aspect of wrists, rash also appears vasculitic, especially on the left wrist, please see photograph included with the EDP note in the medical record.  He also has a 1.5 cm erythematous lesion with central black eschar on R lateral elbow.  Lymph nodes:   Cervical, supraclavicular, and axillary nodes normal  Neurologic:   CNII-XII intact. Normal strength, sensation and reflexes      throughout    Labs on Admission:  Basic Metabolic Panel:  Recent Labs Lab 10/20/13 1935  NA 139  K 4.0  CL 98  CO2 28  GLUCOSE 93  BUN 8  CREATININE 0.59  CALCIUM 9.5   Liver Function Tests:  Recent Labs Lab 10/20/13 1935  AST 56*  ALT 20  ALKPHOS 101  BILITOT 2.3*  PROT 8.3  ALBUMIN 3.7   No results found for this basename: LIPASE, AMYLASE,  in the last 168 hours No results found for this basename: AMMONIA,  in the last 168 hours CBC:  Recent Labs Lab 10/20/13 1935  WBC 12.7*  NEUTROABS 7.7  HGB 7.2*  HCT 22.2*  MCV 100.5*  PLT 503*   Cardiac Enzymes: No results found for this basename: CKTOTAL, CKMB, CKMBINDEX, TROPONINI,  in the last 168 hours  BNP (last 3 results)  Recent  Labs  10/20/13 1935  PROBNP 471.8*   CBG: No results found for this basename: GLUCAP,  in the last 168 hours  Radiological Exams on Admission: Dg Chest 2 View  10/20/2013   CLINICAL DATA:  28 year old male with chest pain. History of sickle cell disease.  EXAM: CHEST  2 VIEW  COMPARISON:  09/24/2013 chest radiograph and chest CT, and multiple prior chest radiographs.  FINDINGS: Mild cardiomegaly and increased vascularity identified.  Mild interstitial prominence is unchanged.  There is no evidence  of focal airspace disease, pulmonary edema, suspicious pulmonary nodule/mass, pleural effusion, or pneumothorax.  No acute bony abnormalities are identified.  A right Port-A-Cath and left shoulder hemiarthroplasty changes again noted.  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  IMPRESSION: Mild cardiomegaly without evidence of acute cardiopulmonary disease.   Electronically Signed   By: Hassan Rowan M.D.   On: 10/20/2013 20:17    EKG: Independently reviewed.  Assessment/Plan Principal Problem:   Sickle cell pain crisis Active Problems:   Sickle cell crisis   Rash   Peripheral edema   1. Sickle cell pain crisis - putting patient on his usual dilaudid PCA, and continuing home long acting MS contin.  HGB 7.2 which is actually quite high for this patient (who is transfused when less than 5), so no transfusion at this time. 2. Rash - the patients rash appears vasculitic, in nature, DDX is broad, may ultimately require skin biopsy for formal diagnosis.  Given that the patient just started hydroxyurea last month and has peripheral edema and vasculitic skin rash (both of which are known adverse reactions to this drug) will go ahead and hold hydroxyurea at this time until Dr. Beryle Beams can weigh in on this. 3. Peripheral edema - no obvious renal involvement at this point (no protein on UA, no change in renal function), normal albumin and total protein.  Therefore will  order 2d echo to evaluate his cardiac function further.  Does not appear to have rash on his legs (unlike hands).    Code Status: Full Code  Family Communication: No family in room Disposition Plan: Admit to obs   Time spent: 70 min  Katiejo Gilroy M. [facility] Pager [phone]  If 7AM-7PM, please contact the day team taking care of the patient [email or web address] Password [email or web address] 10/20/2013, 11:55 PM

## 2013-10-21 DIAGNOSIS — K5900 Constipation, unspecified: Secondary | ICD-10-CM

## 2013-10-21 DIAGNOSIS — M549 Dorsalgia, unspecified: Secondary | ICD-10-CM

## 2013-10-21 DIAGNOSIS — I369 Nonrheumatic tricuspid valve disorder, unspecified: Secondary | ICD-10-CM

## 2013-10-21 LAB — SEDIMENTATION RATE: Sed Rate: 139 mm/hr — ABNORMAL HIGH (ref 0–16)

## 2013-10-21 LAB — C-REACTIVE PROTEIN: CRP: 2.1 mg/dL — ABNORMAL HIGH (ref <0.60)

## 2013-10-21 MED ORDER — SODIUM CHLORIDE 0.45 % IV SOLN
INTRAVENOUS | Status: DC
  Administered 2013-10-21 (×2): via INTRAVENOUS
  Administered 2013-10-22: 1000 mL via INTRAVENOUS
  Administered 2013-10-23 (×2): via INTRAVENOUS

## 2013-10-21 MED ORDER — HYDROXYUREA 500 MG PO CAPS
500.0000 mg | ORAL_CAPSULE | Freq: Three times a day (TID) | ORAL | Status: DC
  Administered 2013-10-22 – 2013-10-24 (×8): 500 mg via ORAL
  Filled 2013-10-21 (×11): qty 1

## 2013-10-21 MED ORDER — ALBUTEROL SULFATE (2.5 MG/3ML) 0.083% IN NEBU: 2.5000 mg | INHALATION_SOLUTION | Freq: Four times a day (QID) | RESPIRATORY_TRACT | Status: DC | PRN

## 2013-10-21 MED ORDER — SODIUM CHLORIDE 0.9 % IJ SOLN
10.0000 mL | INTRAMUSCULAR | Status: DC | PRN
  Administered 2013-10-21 – 2013-10-25 (×3): 10 mL

## 2013-10-21 MED ORDER — HYDROMORPHONE 0.3 MG/ML IV SOLN
INTRAVENOUS | Status: DC
  Administered 2013-10-21: 15:00:00 via INTRAVENOUS
  Administered 2013-10-21: 4.17 mg via INTRAVENOUS
  Administered 2013-10-21: 11:00:00 via INTRAVENOUS
  Administered 2013-10-21: 6.39 mg via INTRAVENOUS
  Administered 2013-10-21: 2.3 mg via INTRAVENOUS
  Administered 2013-10-21: 6.78 mg via INTRAVENOUS
  Administered 2013-10-21: 19:00:00 via INTRAVENOUS
  Administered 2013-10-21: 4.79 mg via INTRAVENOUS
  Administered 2013-10-21: 7.19 mg via INTRAVENOUS
  Administered 2013-10-22: 25 mL via INTRAVENOUS
  Administered 2013-10-22: 3.19 mg via INTRAVENOUS
  Administered 2013-10-22 (×3): via INTRAVENOUS
  Administered 2013-10-22: 7.5 mg via INTRAVENOUS
  Administered 2013-10-23: 14.9 mg via INTRAVENOUS
  Administered 2013-10-23: 1.6 mg via INTRAVENOUS
  Administered 2013-10-23 (×2): via INTRAVENOUS
  Filled 2013-10-21 (×11): qty 25

## 2013-10-21 NOTE — Progress Notes (Signed)
UR completed 

## 2013-10-21 NOTE — Progress Notes (Signed)
  Echocardiogram 2D Echocardiogram has been performed.  Dennis Bernard M 10/21/2013, 11:00 AM

## 2013-10-21 NOTE — Progress Notes (Signed)
PHYSICIAN PROGRESS NOTE  Dennis Bernard EQA:[id number] DOB: 11-06-1985 DOA: 10/20/2013  10/21/2013   PCP: Wilhelmina Mcardle, MD  Assessment/Plan: 1. Acute Vaso-occlusive Sickle Cell Crisis - Pt appears comfortable but reports that pain is 9/10 and baseline pain is 3/10.  He has received 9.59 mg, 21 attempts, 12 attempts received.  Continuing his home MS Contin 60 mg BID, Toradol 30 mg IV every 6 hours, follow CBC. Gentle IVFs orders, oxygen, Kpad ordered.  He is working with Dr. Beryle Beams in consideration for starting exchange transfusions.  Transfusion consideration for hemoglobin less than 5.  Pt had recent MRI of LS and xrays from recent hospitalization that were reviewed.   2. Rash - appears to be vasculitic, it is resolving spontaneously now, elevated sed rate noted. May need outpatient biopsy if persists.  Hydrea currently being held as he started having rash and edema after recently increasing the medication. Follow up with his hematologist to discuss further.  Likely will try restarting at lower dose as tolerated in next 1-2 days.   3. Peripheral Edema - ECHO pending, monitoring I/Os, weights 4. Chronic Constipation - resuming scheduled laxatives.  5. 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    Objective: Filed Vitals:   10/21/13 0735  BP:   Pulse:   Temp:   Resp: 12    Intake/Output Summary (Last 24 hours) at 10/21/13 0921 Last data filed at 10/21/13 0100  Gross per 24 hour  Intake    240 ml  Output      0 ml  Net    240 ml   Filed Weights   10/21/13 0117  Weight: 176 lb 1.6 oz (79.878 kg)   Exam:    General:  Awake, alert, no apparent distress  Cardiovascular: normal s1, s2 sounds   Respiratory: BBS clear to auscultation   Abdomen: soft, nondistended, nontender, no masses  Musculoskeletal: healing rash on  surface of both wrists, tenderness of lumbar spine with palpation  Data Reviewed: Basic Metabolic Panel:  Recent Labs Lab 10/20/13 1935  NA 139  K 4.0  CL 98  CO2 28  GLUCOSE 93  BUN 8  CREATININE 0.59  CALCIUM 9.5   Liver Function Tests:  Recent Labs Lab 10/20/13 1935  AST 56*  ALT 20  ALKPHOS 101  BILITOT 2.3*  PROT 8.3  ALBUMIN 3.7   No results found for this basename: LIPASE, AMYLASE,  in the last 168 hours No results found for this basename: AMMONIA,  in the last 168 hours CBC:  Recent Labs Lab 10/20/13 1935  WBC 12.7*  NEUTROABS 7.7  HGB 7.2*  HCT 22.2*  MCV 100.5*  PLT 503*   Cardiac Enzymes: No results found for this basename: CKTOTAL, CKMB, CKMBINDEX, TROPONINI,  in the last 168 hours BNP (last 3 results)  Recent Labs  10/20/13 1935  PROBNP 471.8*   CBG: No results found for this basename: GLUCAP,  in the last 168 hours  No results found for this or any previous visit (from the past 240 hour(s)).   Studies: Dg Chest 2 View  10/20/2013   CLINICAL DATA:  28 year old male with chest pain. History of sickle cell disease.  EXAM: CHEST  2 VIEW  COMPARISON:  09/24/2013 chest radiograph and chest CT, and multiple prior chest radiographs.  FINDINGS: Mild cardiomegaly and increased vascularity identified.  Mild interstitial prominence is unchanged.  There is no evidence of focal airspace disease, pulmonary edema, suspicious pulmonary nodule/mass, pleural effusion, or pneumothorax.  No acute bony abnormalities are identified.  A right Port-A-Cath and left shoulder hemiarthroplasty changes again noted.  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  IMPRESSION: Mild cardiomegaly without evidence of acute cardiopulmonary disease.   Electronically Signed   By: Hassan Rowan M.D.   On: 10/20/2013 20:17   Scheduled Meds: . folic acid  1 mg Oral Daily  . heparin  5,000 Units Subcutaneous Q8H  . HYDROmorphone PCA 0.3 mg/mL    Intravenous Q4H  . ketorolac  30 mg Intravenous Q6H  . morphine  60 mg Oral BID  . multivitamin with minerals  1 tablet Oral Daily  . polyethylene glycol  17 g Oral BID  . senna-docusate  1 tablet Oral BID   Continuous Infusions: . sodium chloride     Principal Problem:   Sickle cell pain crisis Active Problems:   Sickle cell crisis   Rash   Peripheral edema  [place] [employer] Pager [phone]. If 7PM-7AM, please contact night-coverage at [email or web address], password [email or web address] 10/21/2013, 9:21 AM  LOS: 1 day

## 2013-10-21 NOTE — Progress Notes (Signed)
24 hour total was 9.59 mg 21 total attempts 12 attempts received

## 2013-10-21 NOTE — ED Notes (Signed)
Attempted lab draw but patient wants labs drawn from port. RN made aware.

## 2013-10-21 NOTE — Progress Notes (Signed)
Pt resting.  Good control with PCA pump. Ate dinner when he arrived to floor.  No n/v or c/o pain on abd.

## 2013-10-22 DIAGNOSIS — S62609A Fracture of unspecified phalanx of unspecified finger, initial encounter for closed fracture: Secondary | ICD-10-CM

## 2013-10-22 DIAGNOSIS — R0789 Other chest pain: Secondary | ICD-10-CM

## 2013-10-22 LAB — PRO B NATRIURETIC PEPTIDE: PRO B NATRI PEPTIDE: 160.5 pg/mL — AB (ref 0–125)

## 2013-10-22 LAB — BASIC METABOLIC PANEL
BUN: 14 mg/dL (ref 6–23)
CO2: 28 mEq/L (ref 19–32)
CREATININE: 0.81 mg/dL (ref 0.50–1.35)
Calcium: 8.6 mg/dL (ref 8.4–10.5)
Chloride: 100 mEq/L (ref 96–112)
GFR calc non Af Amer: >90 mL/min (ref >90)
Glucose, Bld: 93 mg/dL (ref 70–99)
Potassium: 3.8 mEq/L (ref 3.7–5.3)
SODIUM: 137 meq/L (ref 137–147)

## 2013-10-22 LAB — CBC
HCT: 20.1 % — ABNORMAL LOW (ref 39.0–52.0)
Hemoglobin: 6.5 g/dL — CL (ref 13.0–17.0)
MCH: 32.7 pg (ref 26.0–34.0)
MCHC: 32.3 g/dL (ref 30.0–36.0)
MCV: 101 fL — AB (ref 78.0–100.0)
PLATELETS: 445 10*3/uL — AB (ref 150–400)
RBC: 1.99 MIL/uL — ABNORMAL LOW (ref 4.22–5.81)
RDW: 23.3 % — AB (ref 11.5–15.5)
WBC: 10.6 10*3/uL — AB (ref 4.0–10.5)

## 2013-10-22 LAB — SEDIMENTATION RATE: Sed Rate: 128 mm/hr — ABNORMAL HIGH (ref 0–16)

## 2013-10-22 LAB — LACTATE DEHYDROGENASE: LDH: 304 U/L — ABNORMAL HIGH (ref 94–250)

## 2013-10-22 MED ORDER — TRIAMCINOLONE ACETONIDE 0.1 % EX OINT
TOPICAL_OINTMENT | Freq: Two times a day (BID) | CUTANEOUS | Status: DC
  Administered 2013-10-22 – 2013-10-25 (×7): via TOPICAL
  Filled 2013-10-22: qty 15

## 2013-10-22 NOTE — Progress Notes (Addendum)
PHYSICIAN PROGRESS NOTE  Dennis Bernard WUJ:[id number] DOB: Mar 01, 1986 DOA: 10/20/2013  10/22/2013   PCP: Wilhelmina Mcardle, MD  Assessment/Plan: 1. Acute Vaso-occlusive Sickle Cell Crisis - Pt appears comfortable and reports pain is better controlled on PCA although still frequent use of PCA noted. Will continue today.  Continuing his home MS Contin 60 mg BID, Toradol 30 mg IV every 6 hours, follow CBC. Gentle IVFs orders, oxygen, Kpad ordered. He is working with Dr. Beryle Beams in consideration for starting chronic exchange transfusions in hopes of decreasing vaso-occlusive events and hospitalizations. Transfusion consideration only for hemoglobin less than 5 or symptomatic anemia. Restarted hydrea at lower dose.  Pt had recent MRI of LS and xrays from recent hospitalization that were reviewed.  2. Rash - initially appeared to be vasculitic but also possible reaction to recent hydrea dose increase, it is resolving spontaneously now, elevated sed rate noted. May need outpatient biopsy if persists. Hydrea currently restarted at lower dose as patient had rash and peripheral edema at higher doses. 3.  Peripheral Edema - improved now.  ECHO reviewed, no significant abnormalities noted, monitoring I/Os, weights 4. Chronic Constipation - resuming scheduled laxatives.  5. 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  He is slowly starting to ambulate in room.  He is eating and drinking.     Objective: Filed Vitals:   10/22/13 1000  BP: 122/82  Pulse: 88  Temp: 98.3 F (36.8 C)  Resp: 16    Intake/Output Summary (Last 24 hours) at 10/22/13 1041 Last data filed at 10/22/13 1030  Gross per 24 hour  Intake 941.77 ml  Output   1925 ml  Net -983.23 ml   Filed Weights   10/21/13 0117 10/22/13 0537  Weight: 176 lb 1.6 oz (79.878 kg) 179 lb 8 oz (81.421 kg)    Exam:  General: Awake, alert, no apparent distress  Cardiovascular: normal s1, s2 sounds  Respiratory: BBS clear to auscultation  Abdomen: soft, nondistended, nontender, no masses  Musculoskeletal: edema resolved now. healing rash on surface of both wrists, tenderness of lumbar spine with palpation  Data Reviewed: Basic Metabolic Panel:  Recent Labs Lab 10/20/13 1935 10/22/13 0300  NA 139 137  K 4.0 3.8  CL 98 100  CO2 28 28  GLUCOSE 93 93  BUN 8 14  CREATININE 0.59 0.81  CALCIUM 9.5 8.6   Liver Function Tests:  Recent Labs Lab 10/20/13 1935  AST 56*  ALT 20  ALKPHOS 101  BILITOT 2.3*  PROT 8.3  ALBUMIN 3.7   No results found for this basename: LIPASE, AMYLASE,  in the last 168 hours No results found for this basename: AMMONIA,  in the last 168 hours CBC:  Recent Labs Lab 10/20/13 1935 10/22/13 0300  WBC 12.7* 10.6*  NEUTROABS 7.7  --   HGB 7.2* 6.5*  HCT 22.2* 20.1*  MCV 100.5* 101.0*  PLT 503* 445*   Cardiac Enzymes: No results found for this basename: CKTOTAL, CKMB, CKMBINDEX, TROPONINI,  in the last 168 hours BNP (last 3 results)  Recent Labs  10/20/13 1935 10/22/13 0300  PROBNP 471.8* 160.5*   CBG: No results found for this basename: GLUCAP,  in the last 168 hours  No results found for this or any previous visit (from the past 240 hour(s)).   Studies: Dg Chest 2 View  10/20/2013  CLINICAL DATA:  28 year old male with chest pain. History of sickle cell disease.  EXAM: CHEST  2 VIEW  COMPARISON:  09/24/2013 chest radiograph and chest CT, and multiple prior chest radiographs.  FINDINGS: Mild cardiomegaly and increased vascularity identified.  Mild interstitial prominence is unchanged.  There is no evidence of focal airspace disease, pulmonary edema, suspicious pulmonary nodule/mass, pleural effusion, or pneumothorax.  No acute bony abnormalities are identified.  A right Port-A-Cath and left shoulder hemiarthroplasty changes again noted.  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  IMPRESSION: Mild cardiomegaly without evidence of acute cardiopulmonary disease.   Electronically Signed   By: Hassan Rowan M.D.   On: 10/20/2013 20:17   Scheduled Meds: . folic acid  1 mg Oral Daily  . heparin  5,000 Units Subcutaneous Q8H  . HYDROmorphone PCA 0.3 mg/mL   Intravenous Q4H  . hydroxyurea  500 mg Oral TID WC  . ketorolac  30 mg Intravenous Q6H  . morphine  60 mg Oral BID  . multivitamin with minerals  1 tablet Oral Daily  . polyethylene glycol  17 g Oral BID  . senna-docusate  1 tablet Oral BID   Continuous Infusions: . sodium chloride 1,000 mL (10/22/13 0920)   Principal Problem:   Sickle cell pain crisis Active Problems:   Sickle cell crisis   Rash   Peripheral edema  [facility]  [facility] Pager [phone]. If 7PM-7AM, please contact night-coverage at [email or web address], password [facility] 10/22/2013, 10:41 AM  LOS: 2 days

## 2013-10-22 NOTE — Progress Notes (Signed)
Slept for a couple of hours during night.  Stated with PCA pain was never below a 7.  Vital sign stable.  No resp. distress noted.  Up walking around bed.  Using urinal. Call bell in reach.

## 2013-10-22 NOTE — Progress Notes (Signed)
Doctor on called informed of critical HGB of 6.5.  Message sent to Sickle  Cell on call MD

## 2013-10-22 NOTE — Progress Notes (Signed)
Pt is not to be transfused unless his Hgb is less than 5.

## 2013-10-23 DIAGNOSIS — G8929 Other chronic pain: Secondary | ICD-10-CM

## 2013-10-23 MED ORDER — HYDROMORPHONE HCL 4 MG PO TABS
4.0000 mg | ORAL_TABLET | ORAL | Status: DC | PRN
  Administered 2013-10-23 – 2013-10-24 (×5): 4 mg via ORAL
  Filled 2013-10-23 (×5): qty 1

## 2013-10-23 MED ORDER — HYDROMORPHONE 0.3 MG/ML IV SOLN
INTRAVENOUS | Status: DC
  Administered 2013-10-23: 25 mL via INTRAVENOUS
  Administered 2013-10-23: 22:00:00 via INTRAVENOUS
  Administered 2013-10-23: 4.39 mg via INTRAVENOUS
  Administered 2013-10-23: 1.6 mg via INTRAVENOUS
  Administered 2013-10-24: 4.32 mg via INTRAVENOUS
  Administered 2013-10-24: 3.35 mg via INTRAVENOUS
  Administered 2013-10-24: 08:00:00 via INTRAVENOUS
  Administered 2013-10-24: 4.59 mg via INTRAVENOUS
  Administered 2013-10-24: 3.88 mg via INTRAVENOUS
  Administered 2013-10-24: 23:00:00 via INTRAVENOUS
  Administered 2013-10-24: 3.59 mg via INTRAVENOUS
  Administered 2013-10-24: 15:00:00 via INTRAVENOUS
  Administered 2013-10-24: 3.59 mg via INTRAVENOUS
  Administered 2013-10-25: 1.59 mg via INTRAVENOUS
  Administered 2013-10-25: 3.59 mg via INTRAVENOUS
  Administered 2013-10-25: 08:00:00 via INTRAVENOUS
  Administered 2013-10-25: 3.92 mg via INTRAVENOUS
  Filled 2013-10-23 (×6): qty 25

## 2013-10-23 NOTE — Progress Notes (Addendum)
PHYSICIAN PROGRESS NOTE  Dennis Bernard [id number]:[id number] DOB: [birth year] DOA: 10/20/2013  10/23/2013   PCP: Dennis Mcardle, MD   Assessment/Plan: 1. Acute Vaso-occlusive Sickle Cell Crisis - Pt appears comfortable and reports pain is better controlled.  Will get him back on his home regimen today.  Will continue PCA at reduced dose as needed for breakthrough pain as needed while transitioning to home oral regimen. MS Contin 60 mg BID, Dilaudid 4 mg every 4 hours prn. Continue Toradol 30 mg IV every 6 hours to treat inflammation, follow CBC and BMP. KVO IVFs, oxygen, Kpad ordered. He is working with Dennis Bernard in consideration for starting chronic exchange transfusions in hopes of decreasing vaso-occlusive events and hospitalizations. Transfusion consideration only for hemoglobin less than 5 or symptomatic anemia. Restarted hydrea at lower dose because of concern that rash possibly related to higher dosing regimen that he recently started.  Encouraging more ambulation today and incentive spirometry.   2. Pt had some chest tightness last night - it has resolved now, EKG nonspecific but no acute findings, Pt did not take a neb treatment. He was encouraged to take a neb treatment when wheezing, coughing, chest tightness or dyspnea.  3. Rash - initially appeared to be vasculitic (see ER note picture) but also possible reaction to recent hydrea dose increase, it is improving now with triamcinolone ointment. May need outpatient biopsy if persists. Hydrea currently restarted at lower dose as patient had rash and peripheral edema at higher doses. 4. Peripheral Edema - resolved now. ECHO reviewed, no significant abnormalities noted, no suggestion of pulmonary hypertension, monitoring I/Os, weights 5. Chronic Constipation - Pt has had 2 BMs in last 24 hours,  Continue laxatives.  6. 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   Code Status: FULL  Family Communication: none at bedside  Dispo:  Anticipate DC in 1-2 days   HPI/Subjective: Pt reports that his pain is better   Objective: Filed Vitals:   10/23/13 0739  BP:   Pulse:   Temp:   Resp: 15    Intake/Output Summary (Last 24 hours) at 10/23/13 0908 Last data filed at 10/23/13 0816  Gross per 24 hour  Intake 3442.5 ml  Output   3960 ml  Net -517.5 ml   Filed Weights   10/21/13 0117 10/22/13 0537 10/23/13 0532  Weight: 176 lb 1.6 oz (79.878 kg) 179 lb 8 oz (81.421 kg) 177 lb (80.287 kg)   Exam:  General: Awake, alert, no apparent distress  Cardiovascular: normal s1, s2 sounds  Respiratory: BBS clear to auscultation  Abdomen: soft, nondistended, nontender, no masses  Musculoskeletal: no pretibial edema, healing rash on surface of both wrists, tenderness of lumbar spine with palpation  Data Reviewed: Basic Metabolic Panel:  Recent Labs Lab 10/20/13 1935 10/22/13 0300  NA 139 137  K 4.0 3.8  CL 98 100  CO2 28 28  GLUCOSE 93 93  BUN 8 14  CREATININE 0.59 0.81  CALCIUM 9.5 8.6   Liver Function Tests:  Recent Labs Lab 10/20/13 1935  AST 56*  ALT 20  ALKPHOS 101  BILITOT 2.3*  PROT 8.3  ALBUMIN 3.7   No results found for this basename: LIPASE, AMYLASE,  in the last 168 hours No results found for this basename: AMMONIA,  in the last 168 hours CBC:  Recent Labs Lab 10/20/13 1935 10/22/13 0300  WBC 12.7* 10.6*  NEUTROABS 7.7  --   HGB 7.2* 6.5*  HCT 22.2* 20.1*  MCV 100.5* 101.0*  PLT 503* 445*   Cardiac Enzymes: No results found for this basename: CKTOTAL, CKMB, CKMBINDEX, TROPONINI,  in the last 168 hours BNP (last 3 results)  Recent Labs  10/20/13 1935 10/22/13 0300  PROBNP 471.8* 160.5*   CBG: No results found for this basename: GLUCAP,  in the last 168 hours  No results found for this or any previous visit (from the past 240 hour(s)).   Studies: No results found.  Scheduled Meds: . folic acid  1 mg Oral Daily  . heparin  5,000 Units Subcutaneous Q8H  .  HYDROmorphone PCA 0.3 mg/mL   Intravenous Q4H  . hydroxyurea  500 mg Oral TID WC  . ketorolac  30 mg Intravenous Q6H  . morphine  60 mg Oral BID  . multivitamin with minerals  1 tablet Oral Daily  . polyethylene glycol  17 g Oral BID  . senna-docusate  1 tablet Oral BID  . triamcinolone ointment   Topical BID   Continuous Infusions: . sodium chloride 50 mL/hr at 10/23/13 0600   Principal Problem:   Sickle cell pain crisis Active Problems:   Sickle cell crisis   Rash   Peripheral edema  Dennis Bernard  Dennis Bernard Pager [phone]. If 7PM-7AM, please contact night-coverage at [email or web address], password [facility] 10/23/2013, 9:08 AM  LOS: 3 days

## 2013-10-24 LAB — CBC WITH DIFFERENTIAL/PLATELET
BASOS ABS: 0 10*3/uL (ref 0.0–0.1)
BLASTS: 0 %
Band Neutrophils: 0 % (ref 0–10)
Basophils Relative: 0 % (ref 0–1)
Eosinophils Absolute: 0.2 10*3/uL (ref 0.0–0.7)
Eosinophils Relative: 3 % (ref 0–5)
HCT: 21.1 % — ABNORMAL LOW (ref 39.0–52.0)
Hemoglobin: 7.1 g/dL — ABNORMAL LOW (ref 13.0–17.0)
Lymphocytes Relative: 48 % — ABNORMAL HIGH (ref 12–46)
Lymphs Abs: 3.5 10*3/uL (ref 0.7–4.0)
MCH: 33.3 pg (ref 26.0–34.0)
MCHC: 33.6 g/dL (ref 30.0–36.0)
MCV: 99.1 fL (ref 78.0–100.0)
MYELOCYTES: 0 %
Metamyelocytes Relative: 0 %
Monocytes Absolute: 0.4 10*3/uL (ref 0.1–1.0)
Monocytes Relative: 5 % (ref 3–12)
NEUTROS ABS: 3.2 10*3/uL (ref 1.7–7.7)
Neutrophils Relative %: 44 % (ref 43–77)
PLATELETS: 441 10*3/uL — AB (ref 150–400)
PROMYELOCYTES ABS: 0 %
RBC: 2.13 MIL/uL — ABNORMAL LOW (ref 4.22–5.81)
RDW: 20.7 % — AB (ref 11.5–15.5)
WBC: 7.3 10*3/uL (ref 4.0–10.5)
nRBC: 36 /100 WBC — ABNORMAL HIGH

## 2013-10-24 MED ORDER — HYDROXYUREA 500 MG PO CAPS
30.0000 mg/kg | ORAL_CAPSULE | Freq: Every day | ORAL | Status: DC
  Administered 2013-10-25: 2500 mg via ORAL
  Filled 2013-10-24: qty 5

## 2013-10-24 MED ORDER — HYDROMORPHONE HCL 4 MG PO TABS
4.0000 mg | ORAL_TABLET | ORAL | Status: DC
  Administered 2013-10-24 – 2013-10-25 (×6): 4 mg via ORAL
  Filled 2013-10-24 (×6): qty 1

## 2013-10-24 NOTE — Progress Notes (Signed)
SICKLE CELL SERVICE PROGRESS NOTE  Dennis Bernard:[id number] DOB: May 03, 1986 DOA: 10/20/2013 PCP: Wilhelmina Mcardle, MD  Assessment/Plan: Principal Problem:   Sickle cell pain crisis Active Problems:   Sickle cell crisis   Rash   Peripheral edema  1. Hb SS with crisis:  Pt currently has no provider thus keeps running out of medications. By his own admission he has made no attempts to obtain a PMD. He reports his pain to be 6/10 and localized to his back and legs. However he is ambulatory without difficulty. Will transition to oral medications and leave PCA as PRN. Aniticipate discharge home tomorrow. 2. Anemia: Pt has had a decrease in Hb in the past which has required multiple transfusions. 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 So far his Hb is stable and he should not be transfused unless his Hb is below 5. Hyperpigmentation: Pt has some hyperpigmentation on his hands which is likely associated with Hydrea. The patient was on doses as high as 3000 mg/day which is 37 mg/kg/day and exceeds the recommended maximum dose. However the benefits of hydrea outweigh the risk with hyperpigmentation and I will continue his Hydrea at a dose of 2500 mg daily which is 31.4 mg/kg/day. 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 3. Leukocytosis resolved. Likely secondary to crisis 4. Chronic Pain: Pt has a component of chronic pain which if not treated escalates to an acute crisis. I will prescribe a one month supply at discharge to get patient through to finding a Primary Care Provider.   Code Status: full Code Family Communication: N/A Disposition Plan: Anticipate Home tomorrow.  MATTHEWS,MICHELLE A.  Pager [phone]. If 7PM-7AM, please contact night-coverage.  10/24/2013, 3:59 PM  LOS: 4 days   Brief  narrative: Dennis Bernard is a 28 y.o. male who presents to the ED with sickle cell pain crisis (typical symptoms of this) as well as a new rash and peripheral edema (has never had this before). With regards to his sickle cell crisis, his pain is located in BLE and back, onset 2 days ago, not controlled with home meds, and essentially the same as previous crisis pains.  His rash and leg swelling has been present for the past 3 days, constant. Rash is located on the dorsal aspect of the patients wrists bilaterally   Consultants:  None  Procedures:  None  Antibiotics:  None  HPI/Subjective: Pt  reports his pain to be 6/10 and localized to his back and legs. However he is ambulatory without difficulty. He had associated symptoms and is tolerating diet well.   Objective: Filed Vitals:   10/24/13 1037 10/24/13 1042 10/24/13 1227 10/24/13 1517  BP: 121/82   122/83  Pulse: 91   87  Temp: 98.5 F (36.9 C)   98.1 F (36.7 C)  TempSrc: Oral   Oral  Resp: 13  12 16   Height:      Weight:  175 lb 11.3 oz (79.7 kg)    SpO2: 100%  100% 98%   Weight change:   Intake/Output Summary (Last 24 hours) at 10/24/13 1559 Last data filed at 10/24/13 1500  Gross per 24 hour  Intake   1196 ml  Output   4150 ml  Net  -2954 ml    General: Alert, awake, oriented x3, in no acute distress.  HEENT: [place]/AT PEERL, EOMI, anicteric  OROPHARYNX:  Moist, No exudate/ erythema/lesions.  Heart: Regular rate and rhythm, without murmurs, rubs, gallops.  Lungs: Clear to auscultation, no wheezing or rhonchi noted. No increased vocal fremitus resonant to percussion  Abdomen: Soft, nontender, nondistended, positive bowel sounds, no masses no hepatosplenomegaly noted..  Neuro: No focal neurological deficits noted cranial nerves II through XII grossly intact. DTRs 2+ bilaterally upper and lower extremities. Strength 5 out of 5 in bilateral upper and lower extremities. Musculoskeletal: No warm swelling or erythema  around joints, no spinal tenderness noted. Psychiatric: Patient alert and oriented x3, good insight and cognition, good recent to remote recall. Skin: Pt noted to have hyperpigmentation on the dorsal surfaces of both hands   Data Reviewed: Basic Metabolic Panel:  Recent Labs Lab 10/20/13 1935 10/22/13 0300  NA 139 137  K 4.0 3.8  CL 98 100  CO2 28 28  GLUCOSE 93 93  BUN 8 14  CREATININE 0.59 0.81  CALCIUM 9.5 8.6   Liver Function Tests:  Recent Labs Lab 10/20/13 1935  AST 56*  ALT 20  ALKPHOS 101  BILITOT 2.3*  PROT 8.3  ALBUMIN 3.7   CBC:  Recent Labs Lab 10/20/13 1935 10/22/13 0300 10/24/13 0405  WBC 12.7* 10.6* 7.3  NEUTROABS 7.7  --  3.2  HGB 7.2* 6.5* 7.1*  HCT 22.2* 20.1* 21.1*  MCV 100.5* 101.0* 99.1  PLT 503* 445* 441*   BNP (last 3 results)  Recent Labs  10/20/13 1935 10/22/13 0300  PROBNP 471.8* 160.5*    Studies: Dg Chest 2 View  10/20/2013   CLINICAL DATA:  28 year old male with chest pain. History of sickle cell disease.  EXAM: CHEST  2 VIEW  COMPARISON:  09/24/2013 chest radiograph and chest CT, and multiple prior chest radiographs.  FINDINGS: Mild cardiomegaly and increased vascularity identified.  Mild interstitial prominence is unchanged.  There is no evidence of focal airspace disease, pulmonary edema, suspicious pulmonary nodule/mass, pleural effusion, or pneumothorax.  No acute bony abnormalities are identified.  A right Port-A-Cath and left shoulder hemiarthroplasty changes again noted.  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  IMPRESSION: Mild cardiomegaly without evidence of acute cardiopulmonary disease.   Electronically Signed   By: Hassan Rowan M.D.   On: 10/20/2013 20:17    Scheduled Meds: . folic acid  1 mg Oral Daily  . heparin  5,000 Units Subcutaneous Q8H  . HYDROmorphone PCA 0.3 mg/mL   Intravenous Q4H  . HYDROmorphone  4 mg Oral Q4H  . hydroxyurea  500 mg Oral TID WC  . ketorolac   30 mg Intravenous Q6H  . morphine  60 mg Oral BID  . multivitamin with minerals  1 tablet Oral Daily  . polyethylene glycol  17 g Oral BID  . senna-docusate  1 tablet Oral BID  . triamcinolone ointment   Topical BID   Continuous Infusions: . sodium chloride 10 mL/hr at 10/23/13 1739

## 2013-10-24 NOTE — Progress Notes (Signed)
Incentive spirometry provided to pt.  Education via teach back and demonstration.  Pt able to properly demonstrate use.  Will continue to encourage. Coolidge Breeze, RN 10/24/2013

## 2013-10-25 ENCOUNTER — Encounter: Payer: Self-pay | Admitting: Internal Medicine

## 2013-10-25 LAB — CBC WITH DIFFERENTIAL/PLATELET
BASOS PCT: 0 % (ref 0–1)
Basophils Absolute: 0 10*3/uL (ref 0.0–0.1)
EOS PCT: 3 % (ref 0–5)
Eosinophils Absolute: 0.2 10*3/uL (ref 0.0–0.7)
HCT: 20.9 % — ABNORMAL LOW (ref 39.0–52.0)
HEMOGLOBIN: 7.1 g/dL — AB (ref 13.0–17.0)
Lymphocytes Relative: 36 % (ref 12–46)
Lymphs Abs: 2.3 10*3/uL (ref 0.7–4.0)
MCH: 33.3 pg (ref 26.0–34.0)
MCHC: 34 g/dL (ref 30.0–36.0)
MCV: 98.1 fL (ref 78.0–100.0)
MONO ABS: 0.6 10*3/uL (ref 0.1–1.0)
Monocytes Relative: 10 % (ref 3–12)
NEUTROS PCT: 51 % (ref 43–77)
Neutro Abs: 3.3 10*3/uL (ref 1.7–7.7)
Platelets: 393 10*3/uL (ref 150–400)
RBC: 2.13 MIL/uL — AB (ref 4.22–5.81)
RDW: 19.9 % — ABNORMAL HIGH (ref 11.5–15.5)
WBC: 6.4 10*3/uL (ref 4.0–10.5)

## 2013-10-25 MED ORDER — CELECOXIB 100 MG PO CAPS: 100.0000 mg | ORAL_CAPSULE | Freq: Two times a day (BID) | ORAL | Status: DC

## 2013-10-25 MED ORDER — HYDROMORPHONE HCL 2 MG PO TABS: ORAL_TABLET | ORAL | Status: DC

## 2013-10-25 MED ORDER — HEPARIN SOD (PORK) LOCK FLUSH 100 UNIT/ML IV SOLN
500.0000 [IU] | INTRAVENOUS | Status: AC | PRN
  Administered 2013-10-25: 500 [IU]

## 2013-10-25 MED ORDER — HYDROXYUREA 500 MG PO CAPS: 30.0000 mg/kg | ORAL_CAPSULE | Freq: Every day | ORAL | Status: AC

## 2013-10-25 MED ORDER — MORPHINE SULFATE ER 60 MG PO TBCR: 60.0000 mg | EXTENDED_RELEASE_TABLET | Freq: Two times a day (BID) | ORAL | Status: DC

## 2013-10-25 NOTE — Progress Notes (Signed)
DC instructions reviewed with patient.  Patient to follow up with internal medicine in 2 weeks.  MD strongly recommended that patient find a PCP.  RN reinforced this and stressed the importance of doing so.  Rxs given X3, with 1 additional medication called into the pharmacy of patient's choice.  No changes noted since am assessment.  IV team called to deaccess port.  Patient denies further questions or concerns.  Note given to patient to excuse him from work/school for this admission.

## 2013-10-25 NOTE — Progress Notes (Signed)
24 hour PCA total is 23.91mg .  PCA machine had to be restarted so we were unable to get 24 hour total attempts and number received.  During assessment, patient max limit had been reached this morning at 0715.

## 2013-10-25 NOTE — Progress Notes (Signed)
Patient escorted to ED door for his ride to transport him to home.

## 2013-10-30 NOTE — Discharge Summary (Signed)
Dennis Bernard MRN: [id number] DOB/AGE: 1986/07/04 28 y.o.  Admit date: 10/20/2013 Discharge date: 10/25/2013  Primary Care Physician:  Dennis Mcardle, MD   Discharge Diagnoses:   Patient Active Problem List   Diagnosis Date Noted  . Rash 10/20/2013  . Peripheral edema 10/20/2013  . Sickle cell anemia with crisis 09/24/2013  . Sickle cell crisis 09/04/2013  . Chest tightness 09/04/2013  . Pulmonary venous congestion 07/22/2013  . 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 Non-ABO incompatible blood transfusion 05/30/2013  . Other malaise and fatigue 05/14/2013  . Lump of skin of left upper extremity 03/24/2013  . Narcotic habituation, continuous 02/14/2013  . H/O medication noncompliance 02/13/2013  . Sickle cell anemia 09/22/2012  . Sickle cell pain crisis 08/16/2012  . Chronic pain 05/31/2012  . Backache 03/13/2012    DISCHARGE MEDICATION:   Medication List    STOP taking these medications       oxyCODONE 5 MG immediate release tablet  Commonly known as:  Oxy IR/ROXICODONE      TAKE these medications       albuterol 108 (90 BASE) MCG/ACT inhaler  Commonly known as:  PROVENTIL HFA;VENTOLIN HFA  Inhale 1 puff into the lungs every 6 (six) hours as needed for wheezing or shortness of breath. wheezing     celecoxib 100 MG capsule  Commonly known as:  CELEBREX  Take 1 capsule (100 mg total) by mouth 2 (two) times daily.     diphenhydrAMINE 25 MG tablet  Commonly known as:  BENADRYL  Take 1 tablet (25 mg total) by mouth every 8 (eight) hours as needed for itching.     folic acid 1 MG tablet  Commonly known as:  FOLVITE  Take 1 mg by mouth every morning.     HYDROmorphone 2 MG tablet  Commonly known as:  DILAUDID  Take 4 mg q 4 hours PRN pain for next 48 hours then taper to 2 mg q 4 hours PRN pain     hydroxyurea 500 MG capsule  Commonly known as:  HYDREA  Take 5 capsules (2,500 mg total) by mouth daily. May take with food to minimize GI side  effects.     morphine 60 MG 12 hr tablet  Commonly known as:  MS CONTIN  Take 1 tablet (60 mg total) by mouth 2 (two) times daily.     multivitamin with minerals Tabs tablet  Take 1 tablet by mouth daily.     polyethylene glycol packet  Commonly known as:  MIRALAX / GLYCOLAX  Take 17 g by mouth 2 (two) times daily.     promethazine 25 MG tablet  Commonly known as:  PHENERGAN  Take 1 tablet (25 mg total) by mouth every 6 (six) hours as needed for nausea.     sennosides-docusate sodium 8.6-50 MG tablet  Commonly known as:  SENOKOT-S  Take 1 tablet by mouth 2 (two) times daily.         SIGNIFICANT DIAGNOSTIC STUDIES:  Dg Chest 2 View  10/20/2013   CLINICAL DATA:  28 year old male with chest pain. History of sickle cell disease.  EXAM: CHEST  2 VIEW  COMPARISON:  09/24/2013 chest radiograph and chest CT, and multiple prior chest radiographs.  FINDINGS: Mild cardiomegaly and increased vascularity identified.  Mild interstitial prominence is unchanged.  There is no evidence of focal airspace disease, pulmonary edema, suspicious pulmonary nodule/mass, pleural effusion, or pneumothorax.  No acute bony abnormalities are identified.  A right Port-A-Cath and left shoulder  hemiarthroplasty changes again noted.  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  IMPRESSION: Mild cardiomegaly without evidence of acute cardiopulmonary disease.   Electronically Signed   By: Hassan Rowan M.D.   On: 10/20/2013 20:17       No results found for this or any previous visit (from the past 240 hour(s)).  BRIEF ADMITTING H & P: Dennis Bernard is a 28 y.o. male who presents to the ED with sickle cell pain crisis (typical symptoms of this) as well as a new rash and peripheral edema (has never had this before).  With regards to his sickle cell crisis, his pain is located in BLE and back, onset 2 days ago, not controlled with home meds, and essentially the same as previous  crisis pains.  His rash and leg swelling has been present for the past 3 days, constant. Rash is located on the dorsal aspect of the patients wrists bilaterally    Hospital Course:  Present on Admission:  . Sickle cell pain crisis: Pt was admitted with simple acute pain crisis. He was treated with dilaudid PCA, Toradol and IVF. As pain improved, he was transitioned to oral medications and was discharged on MS Contin and Dilaudid. He was given a prescription of MS Contrin 60 mg #60 tabs and  Dialudid 2 mg # 60 tabs. He does not have a PMD in this area. Pt does have a PMD in [facility] area and is to follow up as necessary.Pt was in good condition att the time of discharge. His Hydrea was adjusted to a dose of 2500 mg /day which is 31.4 mg/kg/day. A prescription was given for Hydrea. Pt also to continue Folic acid.  . Rash: Pt has area of Hyperpigmentation on the dorsal surface of both extremities. It is likely related to Hydrea use. However the benefits of Hydrea in this patient outweighs the aesthetic risk of hyperpigmentation.  . Peripheral edema . Chronic Pain: Pt continued on MS Contin during hospitalization and discharged with a Prescription for 30 day supply.  Disposition and Follow-up:  Pt to follow up with PMD as needed     Discharge Orders   Future Orders Complete By Expires   Activity as tolerated - No restrictions  As directed    Diet general  As directed       DISCHARGE EXAM:  General: Alert, awake, oriented x3, in no acute distress.  Vital Signs:BP107/73, HR 86, T 98.6 F (37 C), temperature source Oral, RR 11, height 5\' 9"  (1.753 m), weight 172 lb 8 oz (78.245 kg), SpO2 96.00%. HEENT: [place]/AT PEERL, EOMI, anicteric  OROPHARYNX: Moist, No exudate/ erythema/lesions.  Heart: Regular rate and rhythm, without murmurs, rubs, gallops.  Lungs: Clear to auscultation, no wheezing or rhonchi noted. No increased vocal fremitus resonant to percussion  Abdomen: Soft, nontender,  nondistended, positive bowel sounds, no masses no hepatosplenomegaly noted..  Neuro: No focal neurological deficits noted cranial nerves II through XII grossly intact. DTRs 2+ bilaterally upper and lower extremities. Strength 5 out of 5 in bilateral upper and lower extremities.  Musculoskeletal: No warm swelling or erythema around joints, no spinal tenderness noted.  Psychiatric: Patient alert and oriented x3, good insight and cognition, good recent to remote recall.  Skin: Pt noted to have hyperpigmentation on the dorsal surfaces of both hands  Total time spent in discharge including face to face and decision making was greater than 30 minutes. Signed: Lowana Hable A. 10/30/2013, 1:29 PM

## 2013-11-06 ENCOUNTER — Emergency Department (HOSPITAL_COMMUNITY): Payer: Medicare Other

## 2013-11-06 ENCOUNTER — Encounter (HOSPITAL_COMMUNITY): Payer: Self-pay | Admitting: Emergency Medicine

## 2013-11-06 ENCOUNTER — Inpatient Hospital Stay (HOSPITAL_COMMUNITY)
Admission: EM | Admit: 2013-11-06 | Discharge: 2013-11-11 | DRG: 193 | Disposition: A | Discharge status: Home / Self Care | Payer: Medicare Other | Attending: Internal Medicine | Admitting: Internal Medicine

## 2013-11-06 DIAGNOSIS — Z96619 Presence of unspecified artificial shoulder joint: Secondary | ICD-10-CM | POA: Diagnosis not present

## 2013-11-06 DIAGNOSIS — Z832 Family history of diseases of the blood and blood-forming organs and certain disorders involving the immune mechanism: Secondary | ICD-10-CM | POA: Diagnosis not present

## 2013-11-06 DIAGNOSIS — M25569 Pain in unspecified knee: Secondary | ICD-10-CM | POA: Diagnosis present

## 2013-11-06 DIAGNOSIS — J189 Pneumonia, unspecified organism: Principal | ICD-10-CM | POA: Diagnosis present

## 2013-11-06 DIAGNOSIS — D5700 Hb-SS disease with crisis, unspecified: Secondary | ICD-10-CM | POA: Diagnosis present

## 2013-11-06 DIAGNOSIS — G894 Chronic pain syndrome: Secondary | ICD-10-CM | POA: Diagnosis present

## 2013-11-06 DIAGNOSIS — Z79899 Other long term (current) drug therapy: Secondary | ICD-10-CM | POA: Diagnosis not present

## 2013-11-06 DIAGNOSIS — D5701 Hb-SS disease with acute chest syndrome: Secondary | ICD-10-CM | POA: Diagnosis present

## 2013-11-06 DIAGNOSIS — E876 Hypokalemia: Secondary | ICD-10-CM | POA: Diagnosis present

## 2013-11-06 DIAGNOSIS — R509 Fever, unspecified: Secondary | ICD-10-CM | POA: Diagnosis present

## 2013-11-06 DIAGNOSIS — Z833 Family history of diabetes mellitus: Secondary | ICD-10-CM

## 2013-11-06 DIAGNOSIS — D571 Sickle-cell disease without crisis: Secondary | ICD-10-CM

## 2013-11-06 DIAGNOSIS — R0789 Other chest pain: Secondary | ICD-10-CM | POA: Diagnosis present

## 2013-11-06 LAB — CBC WITH DIFFERENTIAL/PLATELET
BASOS PCT: 0 % (ref 0–1)
Basophils Absolute: 0 10*3/uL (ref 0.0–0.1)
EOS PCT: 1 % (ref 0–5)
Eosinophils Absolute: 0.1 10*3/uL (ref 0.0–0.7)
HCT: 21 % — ABNORMAL LOW (ref 39.0–52.0)
Hemoglobin: 7 g/dL — ABNORMAL LOW (ref 13.0–17.0)
LYMPHS PCT: 18 % (ref 12–46)
Lymphs Abs: 1.9 10*3/uL (ref 0.7–4.0)
MCH: 33.8 pg (ref 26.0–34.0)
MCHC: 33.3 g/dL (ref 30.0–36.0)
MCV: 101.4 fL — ABNORMAL HIGH (ref 78.0–100.0)
Monocytes Absolute: 0.6 10*3/uL (ref 0.1–1.0)
Monocytes Relative: 6 % (ref 3–12)
NEUTROS PCT: 75 % (ref 43–77)
Neutro Abs: 7.7 10*3/uL (ref 1.7–7.7)
Platelets: 556 10*3/uL — ABNORMAL HIGH (ref 150–400)
RBC: 2.07 MIL/uL — AB (ref 4.22–5.81)
RDW: 18.2 % — ABNORMAL HIGH (ref 11.5–15.5)
WBC: 10.3 10*3/uL (ref 4.0–10.5)
nRBC: 31 /100 WBC — ABNORMAL HIGH

## 2013-11-06 LAB — CREATININE, SERUM: Creatinine, Ser: 0.65 mg/dL (ref 0.50–1.35)

## 2013-11-06 LAB — COMPREHENSIVE METABOLIC PANEL
ALT: 17 U/L (ref 0–53)
AST: 40 U/L — ABNORMAL HIGH (ref 0–37)
Albumin: 3.8 g/dL (ref 3.5–5.2)
Alkaline Phosphatase: 89 U/L (ref 39–117)
BILIRUBIN TOTAL: 1.4 mg/dL — AB (ref 0.3–1.2)
BUN: 12 mg/dL (ref 6–23)
CHLORIDE: 109 meq/L (ref 96–112)
CO2: 21 meq/L (ref 19–32)
Calcium: 9.3 mg/dL (ref 8.4–10.5)
Creatinine, Ser: 0.71 mg/dL (ref 0.50–1.35)
GFR calc Af Amer: >90 mL/min (ref >90)
Glucose, Bld: 99 mg/dL (ref 70–99)
Potassium: 4 mEq/L (ref 3.7–5.3)
SODIUM: 144 meq/L (ref 137–147)
Total Protein: 7.9 g/dL (ref 6.0–8.3)

## 2013-11-06 LAB — URINALYSIS, ROUTINE W REFLEX MICROSCOPIC
BILIRUBIN URINE: NEGATIVE
Glucose, UA: NEGATIVE mg/dL
HGB URINE DIPSTICK: NEGATIVE
Ketones, ur: NEGATIVE mg/dL
Leukocytes, UA: NEGATIVE
NITRITE: NEGATIVE
Protein, ur: NEGATIVE mg/dL
SPECIFIC GRAVITY, URINE: 1.015 (ref 1.005–1.030)
Urobilinogen, UA: 0.2 mg/dL (ref 0.0–1.0)
pH: 5 (ref 5.0–8.0)

## 2013-11-06 LAB — LIPASE, BLOOD: Lipase: 37 U/L (ref 11–59)

## 2013-11-06 LAB — TROPONIN I
Troponin I: <0.3 ng/mL (ref <0.30)
Troponin I: <0.3 ng/mL (ref <0.30)

## 2013-11-06 LAB — CBC
HEMATOCRIT: 19.3 % — AB (ref 39.0–52.0)
Hemoglobin: 6.5 g/dL — CL (ref 13.0–17.0)
MCH: 34.2 pg — ABNORMAL HIGH (ref 26.0–34.0)
MCHC: 33.7 g/dL (ref 30.0–36.0)
MCV: 101.6 fL — ABNORMAL HIGH (ref 78.0–100.0)
Platelets: 498 10*3/uL — ABNORMAL HIGH (ref 150–400)
RBC: 1.9 MIL/uL — ABNORMAL LOW (ref 4.22–5.81)
RDW: 17.9 % — ABNORMAL HIGH (ref 11.5–15.5)
WBC: 10.6 10*3/uL — ABNORMAL HIGH (ref 4.0–10.5)

## 2013-11-06 LAB — TYPE AND SCREEN
ABO/RH(D): AB POS
Antibody Screen: POSITIVE
DAT, IGG: POSITIVE

## 2013-11-06 LAB — RETICULOCYTES
RBC.: 2.07 MIL/uL — ABNORMAL LOW (ref 4.22–5.81)
RETIC COUNT ABSOLUTE: 312.6 10*3/uL — AB (ref 19.0–186.0)
Retic Ct Pct: 15.1 % — ABNORMAL HIGH (ref 0.4–3.1)

## 2013-11-06 MED ORDER — ONDANSETRON HCL 8 MG PO TABS: 8.0000 mg | ORAL_TABLET | Freq: Four times a day (QID) | ORAL | Status: DC | PRN

## 2013-11-06 MED ORDER — ONDANSETRON HCL 4 MG/2ML IJ SOLN
4.0000 mg | Freq: Once | INTRAMUSCULAR | Status: AC
  Administered 2013-11-06: 4 mg via INTRAVENOUS
  Filled 2013-11-06: qty 2

## 2013-11-06 MED ORDER — NALOXONE HCL 0.4 MG/ML IJ SOLN: 0.4000 mg | INTRAMUSCULAR | Status: DC | PRN

## 2013-11-06 MED ORDER — SODIUM CHLORIDE 0.45 % IV SOLN
INTRAVENOUS | Status: DC
  Administered 2013-11-06 – 2013-11-09 (×4): via INTRAVENOUS
  Administered 2013-11-10: 20 mL/h via INTRAVENOUS

## 2013-11-06 MED ORDER — PROMETHAZINE HCL 25 MG PO TABS: 25.0000 mg | ORAL_TABLET | Freq: Four times a day (QID) | ORAL | Status: DC | PRN

## 2013-11-06 MED ORDER — DIPHENHYDRAMINE HCL 50 MG/ML IJ SOLN
12.5000 mg | Freq: Once | INTRAMUSCULAR | Status: AC
  Administered 2013-11-06: 12.5 mg via INTRAVENOUS
  Filled 2013-11-06: qty 1

## 2013-11-06 MED ORDER — ADULT MULTIVITAMIN W/MINERALS CH
1.0000 | ORAL_TABLET | Freq: Every day | ORAL | Status: DC
  Administered 2013-11-06 – 2013-11-11 (×6): 1 via ORAL
  Filled 2013-11-06 (×6): qty 1

## 2013-11-06 MED ORDER — HYDROXYUREA 500 MG PO CAPS
30.0000 mg/kg | ORAL_CAPSULE | Freq: Every day | ORAL | Status: DC
  Filled 2013-11-06: qty 5

## 2013-11-06 MED ORDER — MORPHINE SULFATE ER 60 MG PO TBCR
60.0000 mg | EXTENDED_RELEASE_TABLET | Freq: Two times a day (BID) | ORAL | Status: DC
  Administered 2013-11-06 – 2013-11-11 (×10): 60 mg via ORAL
  Filled 2013-11-06 (×10): qty 2

## 2013-11-06 MED ORDER — SODIUM CHLORIDE 0.9 % IJ SOLN: 9.0000 mL | INTRAMUSCULAR | Status: DC | PRN

## 2013-11-06 MED ORDER — KETOROLAC TROMETHAMINE 15 MG/ML IJ SOLN
30.0000 mg | Freq: Once | INTRAMUSCULAR | Status: AC
  Administered 2013-11-06: 30 mg via INTRAVENOUS
  Filled 2013-11-06: qty 2

## 2013-11-06 MED ORDER — VANCOMYCIN HCL IN DEXTROSE 1-5 GM/200ML-% IV SOLN
1000.0000 mg | Freq: Three times a day (TID) | INTRAVENOUS | Status: DC
  Filled 2013-11-06: qty 200

## 2013-11-06 MED ORDER — HEPARIN SODIUM (PORCINE) 5000 UNIT/ML IJ SOLN
5000.0000 [IU] | Freq: Three times a day (TID) | INTRAMUSCULAR | Status: DC
  Administered 2013-11-06 – 2013-11-10 (×11): 5000 [IU] via SUBCUTANEOUS
  Filled 2013-11-06 (×18): qty 1

## 2013-11-06 MED ORDER — HYDROMORPHONE 0.3 MG/ML IV SOLN
INTRAVENOUS | Status: DC
  Administered 2013-11-06: 4.5 mg via INTRAVENOUS
  Administered 2013-11-06: 1.1 mg via INTRAVENOUS
  Administered 2013-11-06: 0.6 mg via INTRAVENOUS
  Administered 2013-11-06 – 2013-11-07 (×2): via INTRAVENOUS
  Administered 2013-11-07: 1.2 mg via INTRAVENOUS
  Administered 2013-11-07: 2.7 mg via INTRAVENOUS
  Administered 2013-11-07: via INTRAVENOUS
  Administered 2013-11-07: 5.7 mg via INTRAVENOUS
  Filled 2013-11-06 (×3): qty 25

## 2013-11-06 MED ORDER — SODIUM CHLORIDE 0.9 % IV BOLUS (SEPSIS)
1000.0000 mL | Freq: Once | INTRAVENOUS | Status: AC
  Administered 2013-11-06: 1000 mL via INTRAVENOUS

## 2013-11-06 MED ORDER — ONDANSETRON HCL 4 MG/2ML IJ SOLN: 4.0000 mg | Freq: Four times a day (QID) | INTRAMUSCULAR | Status: DC | PRN

## 2013-11-06 MED ORDER — FOLIC ACID 1 MG PO TABS
1.0000 mg | ORAL_TABLET | Freq: Every morning | ORAL | Status: DC
  Administered 2013-11-07 – 2013-11-11 (×5): 1 mg via ORAL
  Filled 2013-11-06 (×5): qty 1

## 2013-11-06 MED ORDER — DEXTROSE 5 % IV SOLN
1.0000 g | Freq: Three times a day (TID) | INTRAVENOUS | Status: DC
  Administered 2013-11-06 – 2013-11-08 (×6): 1 g via INTRAVENOUS
  Filled 2013-11-06 (×7): qty 1

## 2013-11-06 MED ORDER — DIPHENHYDRAMINE HCL 50 MG/ML IJ SOLN: 12.5000 mg | Freq: Four times a day (QID) | INTRAMUSCULAR | Status: DC | PRN

## 2013-11-06 MED ORDER — ALBUTEROL SULFATE (2.5 MG/3ML) 0.083% IN NEBU: 3.0000 mL | INHALATION_SOLUTION | Freq: Four times a day (QID) | RESPIRATORY_TRACT | Status: DC | PRN

## 2013-11-06 MED ORDER — ACETAMINOPHEN 325 MG PO TABS
650.0000 mg | ORAL_TABLET | Freq: Once | ORAL | Status: AC
  Administered 2013-11-06: 650 mg via ORAL
  Filled 2013-11-06: qty 2

## 2013-11-06 MED ORDER — HYDROMORPHONE HCL PF 1 MG/ML IJ SOLN
1.0000 mg | Freq: Once | INTRAMUSCULAR | Status: AC
  Administered 2013-11-06: 1 mg via INTRAVENOUS
  Filled 2013-11-06: qty 1

## 2013-11-06 MED ORDER — DIPHENHYDRAMINE HCL 25 MG PO TABS
25.0000 mg | ORAL_TABLET | Freq: Three times a day (TID) | ORAL | Status: DC | PRN
  Filled 2013-11-06: qty 1

## 2013-11-06 MED ORDER — DIPHENHYDRAMINE HCL 12.5 MG/5ML PO ELIX: 12.5000 mg | ORAL_SOLUTION | Freq: Four times a day (QID) | ORAL | Status: DC | PRN

## 2013-11-06 MED ORDER — POLYETHYLENE GLYCOL 3350 17 G PO PACK
17.0000 g | PACK | Freq: Two times a day (BID) | ORAL | Status: DC | PRN
  Filled 2013-11-06: qty 1

## 2013-11-06 MED ORDER — HYDROMORPHONE HCL PF 1 MG/ML IJ SOLN
2.0000 mg | Freq: Once | INTRAMUSCULAR | Status: AC
  Administered 2013-11-06: 2 mg via INTRAVENOUS
  Filled 2013-11-06: qty 2

## 2013-11-06 MED ORDER — SENNOSIDES-DOCUSATE SODIUM 8.6-50 MG PO TABS: 1.0000 | ORAL_TABLET | Freq: Two times a day (BID) | ORAL | Status: DC | PRN

## 2013-11-06 MED ORDER — VANCOMYCIN HCL 1000 MG IV SOLR
1000.0000 mg | Freq: Three times a day (TID) | INTRAVENOUS | Status: DC
  Administered 2013-11-06 – 2013-11-08 (×5): 1000 mg via INTRAVENOUS
  Filled 2013-11-06 (×7): qty 1000

## 2013-11-06 MED ORDER — SODIUM CHLORIDE 0.9 % IV SOLN
25.0000 mg | INTRAVENOUS | Status: DC | PRN
  Administered 2013-11-06 – 2013-11-11 (×15): 25 mg via INTRAVENOUS
  Filled 2013-11-06 (×17): qty 0.5

## 2013-11-06 NOTE — Progress Notes (Signed)
CRITICAL VALUE ALERT  Critical value received:  Hgb 6.5  Date of notification:  11/06/2013  Time of notification:  1855  Critical value read back:yes  Nurse who received alert:  Aldean Baker RN  MD notified (1st page):  Baltazar Najjar  Time of first page:  1905  MD notified (2nd page):  Time of second page:  Responding MD:  Baltazar Najjar  Time MD responded:  1908 - orders for type and screen

## 2013-11-06 NOTE — H&P (Signed)
[facility] History and Physical  [place] Nordquist DJM:[id number] DOB: 03/31/1986 DOA: 11/06/2013  Referring physician: Dr. Canary Brim PCP: Wilhelmina Mcardle, MD   Chief Complaint: b/l knee pain  HPI: Dennis Bernard is a 28 y.o. male  PMHx of Sickle cell disease, hypokalemia, chronic pain syndrome, avascular necrosis of femoral head presenting to the ED with dry cough, nasal congestion, chest tightness, fever for the past 4-5 days and sickle cell pain for the past 3 days.  He relates fever for the last few days dry nonproductive cough. And some abdominal discomfort no diarrhea nausea. He does relate some sweating at night. We decrease oral intake.  In the ED: Melissa metabolic, was done that shows a high bilirubin CBC with differential was done that shows a white count of 10.3. Chest x-ray was done that showed Cardiomegaly with pulmonary venous hypertension, and indistinct  right middle lobe airspace opacity     Review of Systems:  Constitutional:  No weight loss, night sweats, Fevers, chills, fatigue.  HEENT:  No headaches, Difficulty swallowing,Tooth/dental problems,Sore throat,  No sneezing, itching, ear ache, nasal congestion, post nasal drip,  Cardio-vascular:  No chest pain, Orthopnea, PND, swelling in lower extremities, anasarca, dizziness, palpitations  GI:  No heartburn, indigestion, abdominal pain, nausea, vomiting, diarrhea, change in bowel habits, loss of appetite  Resp:  No change in color of mucus.No wheezing.No chest wall deformity  Skin:  no rash or lesions.  GU:  no dysuria, change in color of urine, no urgency or frequency. No flank pain.  Musculoskeletal:  No joint pain or swelling. No decreased range of motion. No back pain.  Psych:  No change in mood or affect. No depression or anxiety. No memory loss.   Past Medical History  Diagnosis Date  . Sickle cell disease   . Avascular necrosis of femur head, right   . H/O allergic rhinitis   . H/O  hypokalemia   . Chronic pain syndrome   . H/O wheezing     with colds  . 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 Cholecystectomy  1995  . Portacath placement Left 07/04/2012  . Exchange transfusion  02/2008    perioperatively for shoulder surgery  . 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 Portacath placement Right November 2014   Social History:  reports that he has never smoked. He has never used smokeless tobacco. He reports that he does not drink alcohol or use illicit drugs.  No Known Allergies  Family History  Problem Relation Age of Onset  . Sickle cell anemia Brother   . Sickle cell trait Father   . Sickle cell trait Mother   . Diabetes Mellitus II Mother   . Sickle cell anemia Paternal Uncle   . Asthma Neg Hx   . Allergic rhinitis Neg Hx      Prior to Admission medications   Medication Sig Start Date End Date Taking? Authorizing Provider  albuterol (PROVENTIL HFA;VENTOLIN HFA) 108 (90 BASE) MCG/ACT inhaler Inhale 2 puffs into the lungs every 6 (six) hours as needed for wheezing or shortness of breath.   Yes Historical Provider, MD  celecoxib (CELEBREX) 100 MG capsule Take 1 capsule (100 mg total) by mouth 2 (two) times daily. 10/25/13  Yes Leana Gamer, MD  diphenhydrAMINE (BENADRYL) 25 MG tablet Take 1 tablet (25 mg total) by mouth every 8 (eight) hours as needed for itching. 07/29/13  Yes Robbie Lis, MD  folic acid (  FOLVITE) 1 MG tablet Take 1 mg by mouth every morning. 11/27/12  Yes Barton Dubois, MD  HYDROmorphone (DILAUDID) 2 MG tablet Take 4 mg q 4 hours PRN pain for next 48 hours then taper to 2 mg q 4 hours PRN pain 10/25/13  Yes Leana Gamer, MD  hydroxyurea (HYDREA) 500 MG capsule Take 5 capsules (2,500 mg total) by mouth daily. May take with food to minimize GI side effects. 10/25/13  Yes Leana Gamer, MD  morphine (MS CONTIN) 60 MG 12 hr tablet Take 1 tablet (60 mg total)  by mouth 2 (two) times daily. 10/25/13  Yes Leana Gamer, MD  Multiple Vitamin (MULTIVITAMIN WITH MINERALS) TABS tablet Take 1 tablet by mouth daily. 09/15/13  Yes Robbie Lis, MD  polyethylene glycol [facility] / Floria Raveling) packet Take 17 g by mouth 2 (two) times daily as needed for mild constipation or moderate constipation.   Yes Historical Provider, MD  promethazine (PHENERGAN) 25 MG tablet Take 1 tablet (25 mg total) by mouth every 6 (six) hours as needed for nausea. 09/15/13  Yes Robbie Lis, MD  senna-docusate (SENOKOT S) 8.6-50 MG per tablet Take 1 tablet by mouth 2 (two) times daily as needed for mild constipation.   Yes Historical Provider, MD   Physical Exam: Filed Vitals:   11/06/13 1523  BP: 125/79  Pulse: 108  Temp:   Resp: 18    BP 125/79  Pulse 108  Temp(Src) 99.3 F (37.4 C) (Oral)  Resp 18  Ht 5\' 9"  (1.753 m)  Wt 77.111 kg (170 lb)  BMI 25.09 kg/m2  SpO2 100%  General:  Appears calm and comfortable Eyes: PERRL, normal lids, irises & conjunctiva ENT: grossly normal hearing, lips & tongue Neck: no LAD, masses or thyromegaly Cardiovascular: RRR, no m/r/g. No LE edema. Telemetry: SR, no arrhythmias  Respiratory: Moderate air movement with right basilar crackles Abdomen: soft, ntnd Skin: no rash or induration seen on limited exam Musculoskeletal: grossly normal tone BUE/BLE Neurologic: grossly non-focal.          Labs on Admission:  Basic Metabolic Panel:  Recent Labs Lab 11/06/13 1157  NA 144  K 4.0  CL 109  CO2 21  GLUCOSE 99  BUN 12  CREATININE 0.71  CALCIUM 9.3   Liver Function Tests:  Recent Labs Lab 11/06/13 1157  AST 40*  ALT 17  ALKPHOS 89  BILITOT 1.4*  PROT 7.9  ALBUMIN 3.8    Recent Labs Lab 11/06/13 1157  LIPASE 37   No results found for this basename: AMMONIA,  in the last 168 hours CBC:  Recent Labs Lab 11/06/13 1157  WBC 10.3  NEUTROABS 7.7  HGB 7.0*  HCT 21.0*  MCV 101.4*  PLT 556*   Cardiac  Enzymes:  Recent Labs Lab 11/06/13 1157  TROPONINI <0.30    BNP (last 3 results)  Recent Labs  10/20/13 1935 10/22/13 0300  PROBNP 471.8* 160.5*   CBG: No results found for this basename: GLUCAP,  in the last 168 hours  Radiological Exams on Admission: Dg Chest 2 View  11/06/2013   CLINICAL DATA:  Sickle cell crisis.  Fever and chills.  Chest pain.  EXAM: CHEST  2 VIEW  COMPARISON:  DG CHEST 2 VIEW dated 10/20/2013; CT ANGIO CHEST W/CM &/OR WO/CM dated 09/24/2013; DG CHEST 2 VIEW dated 09/24/2013  FINDINGS: Cardiomegaly with prominent pulmonary vasculature particularly in the upper zones. Indistinct right middle lobe opacity medially appears increased from prior exam on  frontal and lateral projections.  Right power injectable Port-A-Cath noted with tip projecting over the SVC. Left proximal humeral prosthesis noted. Chronic vertebral endplate compressions compatible with sickle cell disease.  IMPRESSION: 1. Cardiomegaly with pulmonary venous hypertension, and indistinct right middle lobe airspace opacity which may reflect early pneumonia or early acute chest syndrome. 2. Osseous findings compatible with sickle cell disease.   Electronically Signed   By: Herbie BaltimoreWalt  Liebkemann M.D.   On: 11/06/2013 14:05    EKG: Independently reviewed. Sinus rhythm nonspecific T wave changes in 3 and V1.  Assessment/Plan Sickle-cell crisis with associated acute chest syndrome/ HCAP (healthcare-associated pneumonia) - [facility]. - Get sputum cultures, blood culture, start him on Vanc and cefepime. - Half normal saline at 100 an hour, tunnel for fevers. - Check a  CBC tomorrow morning. - Controlled, continue MS Contin twice a day. - strict I and O's. - cardiac markers troponins   Code Status: full Family Communication: none Disposition Plan: inpatient  Time spent: 75 minutes  Marinda ElkFELIZ ORTIZ, Montrice Gracey [facility] Pager [phone]

## 2013-11-06 NOTE — ED Provider Notes (Signed)
CSN: [id number]     Arrival date & time 11/06/13  1105 History   First MD Initiated Contact with Patient 11/06/13 1156     Chief Complaint  Patient presents with  . Sickle Cell Pain Crisis  . Fever  . Chills     (Consider location/radiation/quality/duration/timing/severity/associated sxs/prior Treatment) The history is provided by the patient. No language interpreter was used.  Dennis Bernard is a 28 y/o M with PMHx of Sickle cell disease, hypokalemia, chronic pain syndrome, avascular necrosis of femoral head presenting to the ED with dry cough, nasal congestion, chest tightness, fever for the past 4-5 days and sickle cell pain for the past 3 days. Reported that his fever has been low grade for the past couple of days. Stated that he has been having a dry cough with chest tightness intermittently associated with cough and without cough. Patient reported that he has been feeling nauseous with abdominal discomfort described as a gaseous sensation. Reported that at night he has been sweating. Reported that he has been having decreased appetite with incresed urinary frequency. Reported that he has been experiencing sickle cell pain for the past 3 days localized to the lower back and bilateral legs described as a sharp aching pain that is constant - patient reported that when he gets sickle cell pain this is how he presents. Reported that he has been taking his pain medications at home but there is no relief. Reported that he has not taken any medications today. Denied headache, melena, adhesive, urinary issues, decreased drinking, decreased eating, blurred vision, weakness, sudden loss of vision. PCP Dr. Raul Del  Past Medical History  Diagnosis Date  . Sickle cell disease   . Avascular necrosis of femur head, right   . H/O allergic rhinitis   . H/O hypokalemia   . Chronic pain syndrome   . H/O wheezing     with colds  . 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 Cholecystectomy  1995  . Portacath placement Left 07/04/2012  . Exchange transfusion  02/2008    perioperatively for shoulder surgery  . 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 Portacath placement Right November 2014   Family History  Problem Relation Age of Onset  . Sickle cell anemia Brother   . Sickle cell trait Father   . Sickle cell trait Mother   . Diabetes Mellitus II Mother   . Sickle cell anemia Paternal Uncle   . Asthma Neg Hx   . Allergic rhinitis Neg Hx    History  Substance Use Topics  . Smoking status: Never Smoker   . Smokeless tobacco: Never Used  . Alcohol Use: No    Review of Systems  Constitutional: Positive for fever and chills.  HENT: Positive for congestion and sore throat.   Respiratory: Positive for cough and chest tightness. Negative for shortness of breath.   Cardiovascular: Negative for chest pain.  Gastrointestinal: Positive for nausea and abdominal pain. Negative for vomiting, diarrhea, constipation, blood in stool and anal bleeding.  Musculoskeletal: Positive for arthralgias (bilateral knees), back pain and myalgias (Bilateral legs). Negative for neck pain.  Neurological: Negative for dizziness, weakness and numbness.  All other systems reviewed and are negative.      Allergies  Review of patient's allergies indicates no known allergies.  Home Medications   Current Outpatient Rx  Name  Route  Sig  Dispense  Refill  . albuterol (PROVENTIL HFA;VENTOLIN HFA) 108 (90 BASE) MCG/ACT  inhaler   Inhalation   Inhale 2 puffs into the lungs every 6 (six) hours as needed for wheezing or shortness of breath.         . celecoxib (CELEBREX) 100 MG capsule   Oral   Take 1 capsule (100 mg total) by mouth 2 (two) times daily.   60 capsule   0   . diphenhydrAMINE (BENADRYL) 25 MG tablet   Oral   Take 1 tablet (25 mg total) by mouth every 8 (eight) hours as needed for itching.   60 tablet   0   . folic  acid (FOLVITE) 1 MG tablet   Oral   Take 1 mg by mouth every morning.         Marland Kitchen HYDROmorphone (DILAUDID) 2 MG tablet      Take 4 mg q 4 hours PRN pain for next 48 hours then taper to 2 mg q 4 hours PRN pain   60 tablet   0   . hydroxyurea (HYDREA) 500 MG capsule   Oral   Take 5 capsules (2,500 mg total) by mouth daily. May take with food to minimize GI side effects.   150 capsule   1   . morphine (MS CONTIN) 60 MG 12 hr tablet   Oral   Take 1 tablet (60 mg total) by mouth 2 (two) times daily.   60 tablet   0   . Multiple Vitamin (MULTIVITAMIN WITH MINERALS) TABS tablet   Oral   Take 1 tablet by mouth daily.   30 tablet   0   . polyethylene glycol (MIRALAX / GLYCOLAX) packet   Oral   Take 17 g by mouth 2 (two) times daily as needed for mild constipation or moderate constipation.         . promethazine (PHENERGAN) 25 MG tablet   Oral   Take 1 tablet (25 mg total) by mouth every 6 (six) hours as needed for nausea.   60 tablet   0   . senna-docusate (SENOKOT S) 8.6-50 MG per tablet   Oral   Take 1 tablet by mouth 2 (two) times daily as needed for mild constipation.          BP 116/69  Pulse 88  Temp(Src) 99.3 F (37.4 C) (Oral)  Resp 20  Ht 5\' 9"  (1.753 m)  Wt 170 lb (77.111 kg)  BMI 25.09 kg/m2  SpO2 100% Physical Exam  Nursing note and vitals reviewed. Constitutional: He is oriented to person, place, and time. He appears well-developed and well-nourished. No distress.  HENT:  Head: Normocephalic and atraumatic.  Mouth/Throat: Oropharynx is clear and moist. No oropharyngeal exudate.  Eyes: Conjunctivae and EOM are normal. Pupils are equal, round, and reactive to light. Right eye exhibits no discharge. Left eye exhibits no discharge.  Neck: Normal range of motion. Neck supple. No tracheal deviation present.  Negative neck stiffness Negative nuchal rigidity Negative cervical lymphadenopathy negative meningeal signs  Cardiovascular: Regular rhythm and  normal heart sounds.  Exam reveals no friction rub.   No murmur heard. Pulses:      Radial pulses are 2+ on the right side, and 2+ on the left side.       Dorsalis pedis pulses are 2+ on the right side, and 2+ on the left side.  Tachycardic upon auscultation  Pulmonary/Chest: Effort normal and breath sounds normal. No respiratory distress. He has no wheezes. He has no rales. He exhibits no tenderness.  Abdominal: Soft. Bowel sounds are normal.  There is no tenderness. There is no guarding.  Musculoskeletal: Normal range of motion. He exhibits tenderness.  Superficial tenderness upon palpation to bilateral legs and lower back-most discomfort noted in the knees bilaterally. Full ROM to upper and lower extremities without difficulty noted, negative ataxia noted.  Lymphadenopathy:    He has no cervical adenopathy.  Neurological: He is alert and oriented to person, place, and time. No cranial nerve deficit. He exhibits normal muscle tone. Coordination normal.  Cranial nerves III-XII grossly intact Strength 5+/5+ to upper and lower extremities bilaterally with resistance applied, equal distribution noted  Skin: Skin is warm and dry. No rash noted. He is not diaphoretic. No erythema.  Psychiatric: He has a normal mood and affect. His behavior is normal. Thought content normal.    ED Course  Procedures (including critical care time)  3:08 PM This provider spoke with Dr. Sandi Mariscal - discussed history, presentation, labs, imaging-findings with pneumonia versus acute chest syndrome. Physician recommended that patient be admitted to the hospital and transferred to [facility] - reported that patient will be admitted under his care at [facility].  Results for orders placed during the hospital encounter of 11/06/13  CBC WITH DIFFERENTIAL      Result Value Ref Range   WBC 10.3  4.0 - 10.5 K/uL   RBC 2.07 (*) 4.22 - 5.81 MIL/uL   Hemoglobin 7.0 (*) 13.0 - 17.0 g/dL   HCT 21.0 (*) 39.0 - 52.0 %    MCV 101.4 (*) 78.0 - 100.0 fL   MCH 33.8  26.0 - 34.0 pg   MCHC 33.3  30.0 - 36.0 g/dL   RDW 18.2 (*) 11.5 - 15.5 %   Platelets 556 (*) 150 - 400 K/uL   Neutrophils Relative % 75  43 - 77 %   Lymphocytes Relative 18  12 - 46 %   Monocytes Relative 6  3 - 12 %   Eosinophils Relative 1  0 - 5 %   Basophils Relative 0  0 - 1 %   nRBC 31 (*) 0 /100 WBC   Neutro Abs 7.7  1.7 - 7.7 K/uL   Lymphs Abs 1.9  0.7 - 4.0 K/uL   Monocytes Absolute 0.6  0.1 - 1.0 K/uL   Eosinophils Absolute 0.1  0.0 - 0.7 K/uL   Basophils Absolute 0.0  0.0 - 0.1 K/uL   RBC Morphology SICKLE CELLS    COMPREHENSIVE METABOLIC PANEL      Result Value Ref Range   Sodium 144  137 - 147 mEq/L   Potassium 4.0  3.7 - 5.3 mEq/L   Chloride 109  96 - 112 mEq/L   CO2 21  19 - 32 mEq/L   Glucose, Bld 99  70 - 99 mg/dL   BUN 12  6 - 23 mg/dL   Creatinine, Ser 0.71  0.50 - 1.35 mg/dL   Calcium 9.3  8.4 - 10.5 mg/dL   Total Protein 7.9  6.0 - 8.3 g/dL   Albumin 3.8  3.5 - 5.2 g/dL   AST 40 (*) 0 - 37 U/L   ALT 17  0 - 53 U/L   Alkaline Phosphatase 89  39 - 117 U/L   Total Bilirubin 1.4 (*) 0.3 - 1.2 mg/dL   GFR calc non Af Amer >90  >90 mL/min   GFR calc Af Amer >90  >90 mL/min  RETICULOCYTES      Result Value Ref Range   Retic Ct Pct 15.1 (*) 0.4 -  3.1 %   RBC. 2.07 (*) 4.22 - 5.81 MIL/uL   Retic Count, Manual 312.6 (*) 19.0 - 186.0 K/uL  URINALYSIS, ROUTINE W REFLEX MICROSCOPIC      Result Value Ref Range   Color, Urine YELLOW  YELLOW   APPearance CLEAR  CLEAR   Specific Gravity, Urine 1.015  1.005 - 1.030   pH 5.0  5.0 - 8.0   Glucose, UA NEGATIVE  NEGATIVE mg/dL   Hgb urine dipstick NEGATIVE  NEGATIVE   Bilirubin Urine NEGATIVE  NEGATIVE   Ketones, ur NEGATIVE  NEGATIVE mg/dL   Protein, ur NEGATIVE  NEGATIVE mg/dL   Urobilinogen, UA 0.2  0.0 - 1.0 mg/dL   Nitrite NEGATIVE  NEGATIVE   Leukocytes, UA NEGATIVE  NEGATIVE  LIPASE, BLOOD      Result Value Ref Range   Lipase 37  11 - 59 U/L  TROPONIN I       Result Value Ref Range   Troponin I <0.30  <0.30 ng/mL   Dg Chest 2 View  11/06/2013   CLINICAL DATA:  Sickle cell crisis.  Fever and chills.  Chest pain.  EXAM: CHEST  2 VIEW  COMPARISON:  DG CHEST 2 VIEW dated 10/20/2013; CT ANGIO CHEST W/CM &/OR WO/CM dated 09/24/2013; DG CHEST 2 VIEW dated 09/24/2013  FINDINGS: Cardiomegaly with prominent pulmonary vasculature particularly in the upper zones. Indistinct right middle lobe opacity medially appears increased from prior exam on frontal and lateral projections.  Right power injectable Port-A-Cath noted with tip projecting over the SVC. Left proximal humeral prosthesis noted. Chronic vertebral endplate compressions compatible with sickle cell disease.  IMPRESSION: 1. Cardiomegaly with pulmonary venous hypertension, and indistinct right middle lobe airspace opacity which may reflect early pneumonia or early acute chest syndrome. 2. Osseous findings compatible with sickle cell disease.   Electronically Signed   By: Sherryl Barters M.D.   On: 11/06/2013 14:05   Dg Chest 2 View  10/20/2013   CLINICAL DATA:  28 year old male with chest pain. History of sickle cell disease.  EXAM: CHEST  2 VIEW  COMPARISON:  09/24/2013 chest radiograph and chest CT, and multiple prior chest radiographs.  FINDINGS: Mild cardiomegaly and increased vascularity identified.  Mild interstitial prominence is unchanged.  There is no evidence of focal airspace disease, pulmonary edema, suspicious pulmonary nodule/mass, pleural effusion, or pneumothorax.  No acute bony abnormalities are identified.  A right Port-A-Cath and left shoulder hemiarthroplasty changes again noted.  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  IMPRESSION: Mild cardiomegaly without evidence of acute cardiopulmonary disease.   Electronically Signed   By: Hassan Rowan M.D.   On: 10/20/2013 20:17   Labs Review Labs Reviewed  CBC WITH DIFFERENTIAL - Abnormal; Notable for the following:     RBC 2.07 (*)    Hemoglobin 7.0 (*)    HCT 21.0 (*)    MCV 101.4 (*)    RDW 18.2 (*)    Platelets 556 (*)    nRBC 31 (*)    All other components within normal limits  COMPREHENSIVE METABOLIC PANEL - Abnormal; Notable for the following:    AST 40 (*)    Total Bilirubin 1.4 (*)    All other components within normal limits  RETICULOCYTES - Abnormal; Notable for the following:    Retic Ct Pct 15.1 (*)    RBC. 2.07 (*)    Retic Count, Manual 312.6 (*)    All other components within normal limits  URINALYSIS, ROUTINE W REFLEX MICROSCOPIC  LIPASE, BLOOD  TROPONIN I   Imaging Review Dg Chest 2 View  11/06/2013   CLINICAL DATA:  Sickle cell crisis.  Fever and chills.  Chest pain.  EXAM: CHEST  2 VIEW  COMPARISON:  DG CHEST 2 VIEW dated 10/20/2013; CT ANGIO CHEST W/CM &/OR WO/CM dated 09/24/2013; DG CHEST 2 VIEW dated 09/24/2013  FINDINGS: Cardiomegaly with prominent pulmonary vasculature particularly in the upper zones. Indistinct right middle lobe opacity medially appears increased from prior exam on frontal and lateral projections.  Right power injectable Port-A-Cath noted with tip projecting over the SVC. Left proximal humeral prosthesis noted. Chronic vertebral endplate compressions compatible with sickle cell disease.  IMPRESSION: 1. Cardiomegaly with pulmonary venous hypertension, and indistinct right middle lobe airspace opacity which may reflect early pneumonia or early acute chest syndrome. 2. Osseous findings compatible with sickle cell disease.   Electronically Signed   By: Sherryl Barters M.D.   On: 11/06/2013 14:05    EKG Interpretation    Date/Time:  Monday November 06 2013 13:20:51 EST Ventricular Rate:  90 PR Interval:  150 QRS Duration: 85 QT Interval:  347 QTC Calculation: 424 R Axis:   43 Text Interpretation:  Sinus rhythm Consider left ventricular hypertrophy nonspecific st abnormalities No significant change since last tracing Confirmed by [facility]  MD, MARTHA [phone]) on  11/06/2013 2:45:22 PM            MDM   Final diagnoses:  Acute chest syndrome  Sickle cell crisis   Medications  acetaminophen (TYLENOL) tablet 650 mg (not administered)  HYDROmorphone (DILAUDID) injection 1 mg (1 mg Intravenous Given 11/06/13 1309)  ondansetron (ZOFRAN) injection 4 mg (4 mg Intravenous Given 11/06/13 1308)  diphenhydrAMINE (BENADRYL) injection 12.5 mg (12.5 mg Intravenous Given 11/06/13 1316)  HYDROmorphone (DILAUDID) injection 2 mg (2 mg Intravenous Given 11/06/13 1420)  diphenhydrAMINE (BENADRYL) injection 12.5 mg (12.5 mg Intravenous Given 11/06/13 1419)   Filed Vitals:   11/06/13 1307 11/06/13 1354 11/06/13 1409 11/06/13 1415  BP: 115/76 118/70 118/65 116/69  Pulse: 102 87 88 88  Temp:      TempSrc:      Resp: 20 20 20    Height:      Weight:      SpO2: 100% 100% 100% 100%    Patient presenting to the ED with sickle cell pain that started 3 days ago, along with chest tightness, nasal congestion, dry cough, sore throat that started approximately 4-5 days ago. Stated that he's been experiencing chest tightness with cough and without cough. Reported that he acute chest syndrome before-denies these symptoms feeling the sam Reported that the sickle cell pain is localized to bilateral legs and lower back described as a sharp, aching sensation that is constant it reported that [place] percent he has sickle cell pain. Reported he has been taking pain medications at home as prescribed-reported no relief.  EKG noted mild ST segment elevation in inferior leads-sinus rhythm a heart rate of 84 beats per minute negative changes noted from previous EKG performed on 10/22/2013. First troponin negative elevation. CBC negative elevation white blood cell count-negative leukocytosis or left shift. CMP negative findings. Reticulocytes noted to be elevated at 15.1. Lipase negative elevation. Urinalysis negative for acute infection-negative nitrites or leukocytes noted-negative signs of  infection. Chest x-ray noted cardia megaly with pulmonary venous hypertension-indistinct right middle lobe airspace opacity which may reflect early pneumonia or early acute chest syndrome. This provider spoke with Dr. Sandi Mariscal who recommended patient  to be transferred to [facility] and that the patient will be admitted to sickle cell team - EMTALA filled out by attending physician. Discussed plan for admission and transfer with patient who agreed to plan of care. Patient stable for transfer.   Jamse Mead, PA-C 11/07/13 1055

## 2013-11-06 NOTE — ED Notes (Signed)
Admitting MD at bedside.

## 2013-11-06 NOTE — ED Notes (Signed)
PA at bedside.

## 2013-11-06 NOTE — Progress Notes (Signed)
ANTIBIOTIC CONSULT NOTE - INITIAL  Pharmacy Consult for Cefepime/Vancomycin  Indication:   No Known Allergies  Patient Measurements: Height: 5\' 9"  (175.3 cm) Weight: 172 lb 2.9 oz (78.1 kg) IBW/kg (Calculated) : 70.7   Vital Signs: Temp: 98.1 F (36.7 C) (02/23 1712) Temp src: Oral (02/23 1712) BP: 116/75 mmHg (02/23 1712) Pulse Rate: 98 (02/23 1712) Intake/Output from previous day:   Intake/Output from this shift:    Labs:  Recent Labs  11/06/13 1157  WBC 10.3  HGB 7.0*  PLT 556*  CREATININE 0.71   Estimated Creatinine Clearance: 136.2 ml/min (by C-G formula based on Cr of 0.71). No results found for this basename: VANCOTROUGH, VANCOPEAK, VANCORANDOM, GENTTROUGH, GENTPEAK, GENTRANDOM, TOBRATROUGH, TOBRAPEAK, TOBRARND, AMIKACINPEAK, AMIKACINTROU, AMIKACIN,  in the last 72 hours   Microbiology: No results found for this or any previous visit (from the past 720 hour(s)).  Medical History: Past Medical History  Diagnosis Date  . Sickle cell disease   . Avascular necrosis of femur head, right   . H/O allergic rhinitis   . H/O hypokalemia   . Chronic pain syndrome   . H/O wheezing     with colds  . Non-ABO incompatible blood transfusion 05/30/2013    Medications:  Scheduled:  . ceFEPime (MAXIPIME) IV  1 g Intravenous 3 times per day  . [START ON 12/08/7122] folic acid  1 mg Oral q morning - 10a  . heparin  5,000 Units Subcutaneous 3 times per day  . HYDROmorphone PCA 0.3 mg/mL   Intravenous 6 times per day  . [START ON 11/07/2013] hydroxyurea  30 mg/kg Oral Daily  . morphine  60 mg Oral BID  . multivitamin with minerals  1 tablet Oral Daily   Infusions:  . sodium chloride     PRN: albuterol, diphenhydrAMINE, diphenhydrAMINE (BENADRYL) IVPB(SICKLE CELL ONLY), diphenhydrAMINE, diphenhydrAMINE, naloxone, ondansetron (ZOFRAN) IV, ondansetron (ZOFRAN) IV, ondansetron, polyethylene glycol, promethazine, senna-docusate, sodium chloride Assessment:  28 yo M with  sickle-cell crisis with associated acute chest syndrome/ HCAP starting Vancomycin and Cefepime.   WBC WNL  Scr WNL with estimated CrCl > 100 ml/min   AF  Blood cultures sent 2/23    Goal of Therapy:  Vancomycin trough level 15-20 mcg/ml Cefepime per renal function   Plan:  1.) Cefepime 1 gram IV q8h 2.) Vancomycin 1 gram IV q8h  3.) monitor CBC, Renal function, CXR, f/u cultures 4.) Check vancomycin troughs as needed  Dennis Bernard, Gaye Alken PharmD Pager #: [phone] 5:31 PM 11/06/2013

## 2013-11-06 NOTE — ED Notes (Signed)
PA made aware of pt's continued 10/10 pain.

## 2013-11-06 NOTE — ED Notes (Signed)
Pt c/o fever, chills and sickle cell pain to legs and back. Pt has not taken anything for pain today.

## 2013-11-06 NOTE — ED Notes (Addendum)
Pt presents with sickle cell to lower back and bilateral upper legs x 3 days progressively worsening. Pt took 4 mg Dilaudid PO last night with no relief. Pt reports pain now 10/10. Pt placed on 2L [place]. Pt has port in right chest that was accessed two days ago by home health nurse. Port placed at [facility]. Flushes easily, good blood return noted.   Pt also reports dry cough x 1 week with fever and chills x 3 days. Pt reports fever last night of 101. Pt reports "feeling cold and clammy." Pt in NAD at this time.

## 2013-11-06 NOTE — ED Notes (Signed)
Pt requesting pain RX. PA made aware. Apologized to pt for delay.

## 2013-11-07 DIAGNOSIS — D5700 Hb-SS disease with crisis, unspecified: Secondary | ICD-10-CM

## 2013-11-07 DIAGNOSIS — R0789 Other chest pain: Secondary | ICD-10-CM

## 2013-11-07 LAB — CBC
HCT: 17.5 % — ABNORMAL LOW (ref 39.0–52.0)
Hemoglobin: 5.8 g/dL — CL (ref 13.0–17.0)
MCH: 33.7 pg (ref 26.0–34.0)
MCHC: 33.1 g/dL (ref 30.0–36.0)
MCV: 101.7 fL — ABNORMAL HIGH (ref 78.0–100.0)
PLATELETS: 431 10*3/uL — AB (ref 150–400)
RBC: 1.72 MIL/uL — AB (ref 4.22–5.81)
RDW: 18.3 % — ABNORMAL HIGH (ref 11.5–15.5)
WBC: 10.3 10*3/uL (ref 4.0–10.5)

## 2013-11-07 LAB — LEGIONELLA ANTIGEN, URINE: LEGIONELLA ANTIGEN, URINE: NEGATIVE

## 2013-11-07 LAB — TROPONIN I: Troponin I: <0.3 ng/mL (ref <0.30)

## 2013-11-07 LAB — HIV ANTIBODY (ROUTINE TESTING W REFLEX): HIV: NONREACTIVE

## 2013-11-07 LAB — STREP PNEUMONIAE URINARY ANTIGEN: Strep Pneumo Urinary Antigen: NEGATIVE

## 2013-11-07 MED ORDER — HYDROMORPHONE 0.3 MG/ML IV SOLN: INTRAVENOUS | Status: DC

## 2013-11-07 MED ORDER — NALOXONE HCL 0.4 MG/ML IJ SOLN: 0.4000 mg | INTRAMUSCULAR | Status: DC | PRN

## 2013-11-07 MED ORDER — SODIUM CHLORIDE 0.9 % IJ SOLN: 9.0000 mL | INTRAMUSCULAR | Status: DC | PRN

## 2013-11-07 MED ORDER — HYDROMORPHONE 0.3 MG/ML IV SOLN
INTRAVENOUS | Status: DC
  Administered 2013-11-07: 10.2 mg via INTRAVENOUS
  Administered 2013-11-07 (×3): via INTRAVENOUS
  Administered 2013-11-07: 11.93 mg via INTRAVENOUS
  Administered 2013-11-07 – 2013-11-08 (×3): via INTRAVENOUS
  Administered 2013-11-08: 12.32 mg via INTRAVENOUS
  Administered 2013-11-08: 21:00:00 via INTRAVENOUS
  Administered 2013-11-08: 13.2 mg via INTRAVENOUS
  Administered 2013-11-08: 13.94 mg via INTRAVENOUS
  Administered 2013-11-08 (×2): via INTRAVENOUS
  Administered 2013-11-08: 8.3 mg via INTRAVENOUS
  Administered 2013-11-08: 7.52 mg via INTRAVENOUS
  Administered 2013-11-09 (×3): via INTRAVENOUS
  Administered 2013-11-09: 8.06 mg via INTRAVENOUS
  Administered 2013-11-09 (×2): via INTRAVENOUS
  Administered 2013-11-09: 12.45 mg via INTRAVENOUS
  Administered 2013-11-09 (×2): via INTRAVENOUS
  Administered 2013-11-09: 10.99 mg via INTRAVENOUS
  Administered 2013-11-09: 4.79 mg via INTRAVENOUS
  Administered 2013-11-09: 4.01 mg via INTRAVENOUS
  Administered 2013-11-10: 4.2 mg via INTRAVENOUS
  Administered 2013-11-10: 06:00:00 via INTRAVENOUS
  Administered 2013-11-10: 9.7 mg via INTRAVENOUS
  Administered 2013-11-10: 11.03 mg via INTRAVENOUS
  Administered 2013-11-10: 23:00:00 via INTRAVENOUS
  Administered 2013-11-10 (×2): 7.5 mg via INTRAVENOUS
  Administered 2013-11-10 – 2013-11-11 (×7): via INTRAVENOUS
  Administered 2013-11-11: 15.12 mg via INTRAVENOUS
  Administered 2013-11-11: 12.48 mg via INTRAVENOUS
  Filled 2013-11-07 (×32): qty 25

## 2013-11-07 NOTE — Progress Notes (Signed)
Paged NP about continual pain on 9/10 while on PCA, order for toradol 30 mg IV x 1 dose obtained.

## 2013-11-07 NOTE — ED Provider Notes (Signed)
Medical screening examination/treatment/procedure(s) were performed by non-physician practitioner and as supervising physician I was immediately available for consultation/collaboration.     Threasa Beards, MD 11/07/13 [phone]

## 2013-11-07 NOTE — Progress Notes (Signed)
Hemoglobin 5.8 this am. Paged NP Baltazar Najjar via [email or web address] at [phone]. No new orders placed by 0700.

## 2013-11-07 NOTE — Progress Notes (Signed)
Pharmacy Note - Hydroxyurea   Labs: Hgb 5.8  A/P: per protocol and policy, Hydroxyurea will be placed on hold due to Hgb<6. Will continue to monitor labs for ability to restart medication.  Adrian Saran, PharmD, BCPS Pager [phone] 11/07/2013 8:30 AM

## 2013-11-07 NOTE — Progress Notes (Addendum)
Patient maxed out PCA for last 20 + minutes. Paged NP on call. No change in orders.

## 2013-11-07 NOTE — Progress Notes (Signed)
Lab able to obtain second blood culture.

## 2013-11-07 NOTE — Plan of Care (Signed)
Problem: Phase I Progression Outcomes Goal: Pain controlled with appropriate interventions Outcome: Not Met (add Reason) Patient reports pain 9/10 even after using PCA and maxing it out.      

## 2013-11-07 NOTE — Progress Notes (Signed)
SICKLE CELL SERVICE PROGRESS NOTE  Dennis Bernard UKG:[id number] DOB: [birth year] DOA: 11/06/2013 PCP: Wilhelmina Mcardle, MD  Assessment/Plan: Active Problems:   Sickle cell anemia   Sickle-cell crisis with associated acute chest syndrome   Acute sickle cell crisis   HCAP (healthcare-associated pneumonia)  1. Hb SS with crisis: Pt states that pain is still 9/10. He is on a regular dose PCA which is in adequate for a patient who is opiate naive. He has used 15.8 mg and has had 56 attempts and 51 deliveries. Will adjust PCA to weight based dosing of Dilaudid. Continue MS Contin and Toradol. Continue IVF for next 24 hours and re-assess pain in the morning. 2. Anemia: Pt had a decrease in Hb overnight he has no icterus and is asymptomatic at present. Pt is known to have antibodies causing post-transfusion hemolysis so would not transfuse for Hb above 5. However will type and screen for most compatible units. If he ends up needing transfusion will need to premedicate with Tylenol Benadryl and solu-medrol. Check LDH and Bilirubin in the morning. 3. Chest tightness: Pt complaining of chest tightness. However his cheat is clear to auscultation and his saturation on RA was 95%. 4. Fever: Pt reports fever in the last few days with Tmax of 100.8. However without any antipyretic therapy, patient has had no fevers. Will continue to monitor.    Code Status: Full Code Family Communication: N/A Disposition Plan: Not yet ready for discharge  [place].  Pager [phone]. If 7PM-7AM, please contact night-coverage.  11/07/2013, 4:41 PM  LOS: 1 day   Brief narrative: Dennis Bernard is a 28 y.o. male with PMHx of Sickle cell disease, hypokalemia, chronic pain syndrome, avascular necrosis of femoral head presenting to the ED with dry cough, nasal congestion, chest tightness, fever for the past 4-5 days and sickle cell pain for the past 3 days. He relates fever for the last few days dry nonproductive  cough. And some abdominal discomfort no diarrhea nausea. He does relate some sweating at night. We decrease oral intake.   Consultants:  None  Procedures:  None  Antibiotics:  None  HPI/Subjective: Pt states that on arrival to the Hospital his pain was 10/10 and it is currently 9/10 localized to back, legs and shoulder. He denies pain in his chest but states that his chest feels tight.  Objective: Filed Vitals:   11/07/13 0957 11/07/13 1149 11/07/13 1325 11/07/13 1622  BP: 107/80  104/67   Pulse: 88  71   Temp: 98.2 F (36.8 C)  97.9 F (36.6 C)   TempSrc: Oral  Oral   Resp: 16 12 16 14   Height:      Weight:      SpO2: 100% 96% 100% 100%   Weight change:   Intake/Output Summary (Last 24 hours) at 11/07/13 1641 Last data filed at 11/07/13 0957  Gross per 24 hour  Intake 2421.67 ml  Output   1675 ml  Net 746.67 ml    General: Alert, awake, oriented x3, in no acute distress.  HEENT: [place]/AT PEERL, EOMI, anicteric Neck: Trachea midline,  no masses, no thyromegal,y no JVD, no carotid bruit OROPHARYNX:  Moist, No exudate/ erythema/lesions.  Heart: Regular rate and rhythm, without murmurs, rubs, gallops, PMI non-displaced, no heaves or thrills on palpation.  Lungs: Clear to auscultation, no wheezing or rhonchi noted.  Abdomen: Soft, nontender, nondistended, positive bowel sounds, no masses no hepatosplenomegaly noted.  Neuro: No focal neurological deficits noted cranial nerves II through XII grossly  intact. DTRs 2+ bilaterally upper and lower extremities. Strength 5 out of 5 in bilateral upper and lower extremities. Musculoskeletal: No warm swelling or erythema around joints, no spinal tenderness noted. Psychiatric: Patient alert and oriented x3, good insight and cognition, good recent to remote recall. Lymph node survey: No cervical axillary or inguinal lymphadenopathy noted.   Data Reviewed: Basic Metabolic Panel:  Recent Labs Lab 11/06/13 1157 11/06/13 1835  NA  144  --   K 4.0  --   CL 109  --   CO2 21  --   GLUCOSE 99  --   BUN 12  --   CREATININE 0.71 0.65  CALCIUM 9.3  --    Liver Function Tests:  Recent Labs Lab 11/06/13 1157  AST 40*  ALT 17  ALKPHOS 89  BILITOT 1.4*  PROT 7.9  ALBUMIN 3.8    Recent Labs Lab 11/06/13 1157  LIPASE 37   No results found for this basename: AMMONIA,  in the last 168 hours CBC:  Recent Labs Lab 11/06/13 1157 11/06/13 1835 11/07/13 0500  WBC 10.3 10.6* 10.3  NEUTROABS 7.7  --   --   HGB 7.0* 6.5* 5.8*  HCT 21.0* 19.3* 17.5*  MCV 101.4* 101.6* 101.7*  PLT 556* 498* 431*   Cardiac Enzymes:  Recent Labs Lab 11/06/13 1157 11/06/13 1835 11/06/13 2258 11/07/13 0500  TROPONINI <0.30 <0.30 <0.30 <0.30   BNP (last 3 results)  Recent Labs  10/20/13 1935 10/22/13 0300  PROBNP 471.8* 160.5*   CBG: No results found for this basename: GLUCAP,  in the last 168 hours  Recent Results (from the past 240 hour(s))  CULTURE, BLOOD (ROUTINE X 2)     Status: None   Collection Time    11/06/13  6:35 PM      Result Value Ref Range Status   Specimen Description BLOOD RIGHT HAND   Final   Special Requests BOTTLES DRAWN AEROBIC AND ANAEROBIC 4CC   Final   Culture  Setup Time     Final   Value: 11/06/2013 23:41     Performed at [facility]   Culture     Final   Value:        BLOOD CULTURE RECEIVED NO GROWTH TO DATE CULTURE WILL BE HELD FOR 5 DAYS BEFORE ISSUING A FINAL NEGATIVE REPORT     Performed at [facility]   Report Status PENDING   Incomplete     Studies: Dg Chest 2 View  11/06/2013   CLINICAL DATA:  Sickle cell crisis.  Fever and chills.  Chest pain.  EXAM: CHEST  2 VIEW  COMPARISON:  DG CHEST 2 VIEW dated 10/20/2013; CT ANGIO CHEST W/CM &/OR WO/CM dated 09/24/2013; DG CHEST 2 VIEW dated 09/24/2013  FINDINGS: Cardiomegaly with prominent pulmonary vasculature particularly in the upper zones. Indistinct right middle lobe opacity medially appears increased from prior  exam on frontal and lateral projections.  Right power injectable Port-A-Cath noted with tip projecting over the SVC. Left proximal humeral prosthesis noted. Chronic vertebral endplate compressions compatible with sickle cell disease.  IMPRESSION: 1. Cardiomegaly with pulmonary venous hypertension, and indistinct right middle lobe airspace opacity which may reflect early pneumonia or early acute chest syndrome. 2. Osseous findings compatible with sickle cell disease.   Electronically Signed   By: Sherryl Barters M.D.   On: 11/06/2013 14:05   Dg Chest 2 View  10/20/2013   CLINICAL DATA:  28 year old male with chest pain. History of sickle cell disease.  EXAM: CHEST  2 VIEW  COMPARISON:  09/24/2013 chest radiograph and chest CT, and multiple prior chest radiographs.  FINDINGS: Mild cardiomegaly and increased vascularity identified.  Mild interstitial prominence is unchanged.  There is no evidence of focal airspace disease, pulmonary edema, suspicious pulmonary nodule/mass, pleural effusion, or pneumothorax.  No acute bony abnormalities are identified.  A right Port-A-Cath and left shoulder hemiarthroplasty changes again noted.  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  IMPRESSION: Mild cardiomegaly without evidence of acute cardiopulmonary disease.   Electronically Signed   By: Hassan Rowan M.D.   On: 10/20/2013 20:17    Scheduled Meds: . ceFEPime (MAXIPIME) IV  1 g Intravenous [id number]  . folic acid  1 mg Oral q morning - 10a  . heparin  5,000 Units Subcutaneous 3 times per day  . HYDROmorphone PCA 0.3 mg/mL   Intravenous 6 times per day  . morphine  60 mg Oral BID  . multivitamin with minerals  1 tablet Oral Daily  . vancomycin (VANCOCIN) 1000 mg IVPB  1,000 mg Intravenous Q8H   Continuous Infusions: . sodium chloride 75 mL/hr at 11/07/13 0905   Total time spent 50 minutes.

## 2013-11-08 LAB — COMPREHENSIVE METABOLIC PANEL
ALT: 15 U/L (ref 0–53)
AST: 31 U/L (ref 0–37)
Albumin: 3.2 g/dL — ABNORMAL LOW (ref 3.5–5.2)
Alkaline Phosphatase: 79 U/L (ref 39–117)
BILIRUBIN TOTAL: 0.9 mg/dL (ref 0.3–1.2)
BUN: 7 mg/dL (ref 6–23)
CHLORIDE: 106 meq/L (ref 96–112)
CO2: 23 meq/L (ref 19–32)
CREATININE: 0.58 mg/dL (ref 0.50–1.35)
Calcium: 8.7 mg/dL (ref 8.4–10.5)
GFR calc Af Amer: >90 mL/min (ref >90)
Glucose, Bld: 111 mg/dL — ABNORMAL HIGH (ref 70–99)
Potassium: 3.6 mEq/L — ABNORMAL LOW (ref 3.7–5.3)
Sodium: 139 mEq/L (ref 137–147)
Total Protein: 6.4 g/dL (ref 6.0–8.3)

## 2013-11-08 LAB — CBC WITH DIFFERENTIAL/PLATELET
Basophils Absolute: 0.1 10*3/uL (ref 0.0–0.1)
Basophils Relative: 1 % (ref 0–1)
EOS PCT: 4 % (ref 0–5)
Eosinophils Absolute: 0.3 10*3/uL (ref 0.0–0.7)
HCT: 17.8 % — ABNORMAL LOW (ref 39.0–52.0)
HEMOGLOBIN: 6 g/dL — AB (ref 13.0–17.0)
Lymphocytes Relative: 50 % — ABNORMAL HIGH (ref 12–46)
Lymphs Abs: 4.3 10*3/uL — ABNORMAL HIGH (ref 0.7–4.0)
MCH: 33.9 pg (ref 26.0–34.0)
MCHC: 33.7 g/dL (ref 30.0–36.0)
MCV: 100.6 fL — ABNORMAL HIGH (ref 78.0–100.0)
MONOS PCT: 12 % (ref 3–12)
Monocytes Absolute: 1 10*3/uL (ref 0.1–1.0)
Neutro Abs: 2.8 10*3/uL (ref 1.7–7.7)
Neutrophils Relative %: 33 % — ABNORMAL LOW (ref 43–77)
PLATELETS: 431 10*3/uL — AB (ref 150–400)
RBC: 1.77 MIL/uL — AB (ref 4.22–5.81)
RDW: 18.2 % — ABNORMAL HIGH (ref 11.5–15.5)
WBC: 8.5 10*3/uL (ref 4.0–10.5)

## 2013-11-08 LAB — LACTATE DEHYDROGENASE: LDH: 353 U/L — ABNORMAL HIGH (ref 94–250)

## 2013-11-08 MED ORDER — HYDROMORPHONE HCL PF 1 MG/ML IJ SOLN
1.0000 mg | INTRAMUSCULAR | Status: DC | PRN
  Administered 2013-11-08 – 2013-11-11 (×16): 1 mg via INTRAVENOUS
  Filled 2013-11-08 (×14): qty 1

## 2013-11-08 NOTE — Progress Notes (Signed)
SICKLE CELL SERVICE PROGRESS NOTE  Dennis Bernard ZOX:[id number] DOB: [birth year] DOA: 11/06/2013 PCP: Wilhelmina Mcardle, MD  Assessment/Plan: Active Problems:   Sickle cell anemia   Sickle-cell crisis with associated acute chest syndrome   Acute sickle cell crisis   HCAP (healthcare-associated pneumonia)  1. Hb SS with crisis: Pt states that pain is still 9/10.He has used 34 mg and has had 50 attempts and 49 deliveries. Continue PCA and add Clinician assisted dase of Dilaudid every 3 hrs as needed. Continue MS Contin and Toradol. Discontinue IVF  and re-assess pain in the morning. Given his low Hb I am hesitant to give patient a basal dose at night. 2. Anemia:  Hb remained stable overnight he has no icterus and is asymptomatic at present. Pt is known to have antibodies causing post-transfusion hemolysis so would not transfuse for Hb above 5. However will type and screen for most compatible units. If he ends up needing transfusion will need to premedicate with Tylenol Benadryl and solu-medrol. Check LDH and Bilirubin in the morning. 3. Chest tightness: Pt complaining of chest tightness. However his chest is clear to auscultation and his saturation on RA was 95%. I have reviewed the CXR with radiology and the clinical picture is more in keeping with early Acute Chest Syndrome. Will discontinue antibiotics and observe off of antibiotics. 4. Fever: Pt reports fever in the last few days with Tmax of 100.8. However without any antipyretic therapy, patient has had no fevers. Will continue to monitor.    Code Status: Full Code Family Communication: N/A Disposition Plan: Not yet ready for discharge  [place].  Pager [phone]. If 7PM-7AM, please contact night-coverage.  11/08/2013, 12:45 PM  LOS: 2 days   Brief narrative: Dennis Bernard is a 28 y.o. male with PMHx of Sickle cell disease, hypokalemia, chronic pain syndrome, avascular necrosis of femoral head presenting to the ED with  dry cough, nasal congestion, chest tightness, fever for the past 4-5 days and sickle cell pain for the past 3 days. He relates fever for the last few days dry nonproductive cough. And some abdominal discomfort no diarrhea nausea. He does relate some sweating at night. We decrease oral intake.   Consultants:  None  Procedures:  None  Antibiotics:  None  HPI/Subjective: Pt states that pain is 7-8/10. However he is not utilizing his PCA maximally. He states that he did not rest well last night secondary to pain.  Objective: Filed Vitals:   11/08/13 0405 11/08/13 0525 11/08/13 0531 11/08/13 0740  BP:   104/68   Pulse: 77 86 86   Temp:   97.6 F (36.4 C)   TempSrc:   Oral   Resp: 14 19 15 18   Height:      Weight:      SpO2: 100% 97% 98%    Weight change:   Intake/Output Summary (Last 24 hours) at 11/08/13 1245 Last data filed at 11/08/13 0532  Gross per 24 hour  Intake 4373.77 ml  Output   1300 ml  Net 3073.77 ml    General: Alert, awake, oriented x3, in no acute distress.  HEENT: [place]/AT PEERL, EOMI, anicteric Neck: Trachea midline,  no masses, no thyromegal,y no JVD, no carotid bruit OROPHARYNX:  Moist, No exudate/ erythema/lesions.  Heart: Regular rate and rhythm, without murmurs, rubs, gallops, PMI non-displaced, no heaves or thrills on palpation.  Lungs: Clear to auscultation, no wheezing or rhonchi noted.  Abdomen: Soft, nontender, nondistended, positive bowel sounds, no masses no hepatosplenomegaly noted.  Neuro: No focal neurological deficits noted cranial nerves II through XII grossly intact. DTRs 2+ bilaterally upper and lower extremities. Strength normal in bilateral upper and lower extremities. Musculoskeletal: No warm swelling or erythema around joints, no spinal tenderness noted. Psychiatric: Patient alert and oriented x3, good insight and cognition, good recent to remote recall. Lymph node survey: No cervical axillary or inguinal lymphadenopathy  noted.   Data Reviewed: Basic Metabolic Panel:  Recent Labs Lab 11/06/13 1157 11/06/13 1835 11/08/13 0525  NA 144  --  139  K 4.0  --  3.6*  CL 109  --  106  CO2 21  --  23  GLUCOSE 99  --  111*  BUN 12  --  7  CREATININE 0.71 0.65 0.58  CALCIUM 9.3  --  8.7   Liver Function Tests:  Recent Labs Lab 11/06/13 1157 11/08/13 0525  AST 40* 31  ALT 17 15  ALKPHOS 89 79  BILITOT 1.4* 0.9  PROT 7.9 6.4  ALBUMIN 3.8 3.2*    Recent Labs Lab 11/06/13 1157  LIPASE 37   No results found for this basename: AMMONIA,  in the last 168 hours CBC:  Recent Labs Lab 11/06/13 1157 11/06/13 1835 11/07/13 0500 11/08/13 0525  WBC 10.3 10.6* 10.3 8.5  NEUTROABS 7.7  --   --  2.8  HGB 7.0* 6.5* 5.8* 6.0*  HCT 21.0* 19.3* 17.5* 17.8*  MCV 101.4* 101.6* 101.7* 100.6*  PLT 556* 498* 431* 431*   Cardiac Enzymes:  Recent Labs Lab 11/06/13 1157 11/06/13 1835 11/06/13 2258 11/07/13 0500  TROPONINI <0.30 <0.30 <0.30 <0.30   BNP (last 3 results)  Recent Labs  10/20/13 1935 10/22/13 0300  PROBNP 471.8* 160.5*   CBG: No results found for this basename: GLUCAP,  in the last 168 hours  Recent Results (from the past 240 hour(s))  CULTURE, BLOOD (ROUTINE X 2)     Status: None   Collection Time    11/06/13  6:35 PM      Result Value Ref Range Status   Specimen Description BLOOD RIGHT HAND   Final   Special Requests BOTTLES DRAWN AEROBIC AND ANAEROBIC 4CC   Final   Culture  Setup Time     Final   Value: 11/06/2013 23:41     Performed at [facility]   Culture     Final   Value:        BLOOD CULTURE RECEIVED NO GROWTH TO DATE CULTURE WILL BE HELD FOR 5 DAYS BEFORE ISSUING A FINAL NEGATIVE REPORT     Performed at [facility]   Report Status PENDING   Incomplete  CULTURE, BLOOD (ROUTINE X 2)     Status: None   Collection Time    11/06/13 10:58 PM      Result Value Ref Range Status   Specimen Description BLOOD LEFT HAND   Final   Special Requests  BOTTLES DRAWN AEROBIC ONLY 1CC   Final   Culture  Setup Time     Final   Value: 11/07/2013 03:32     Performed at [facility]   Culture     Final   Value:        BLOOD CULTURE RECEIVED NO GROWTH TO DATE CULTURE WILL BE HELD FOR 5 DAYS BEFORE ISSUING A FINAL NEGATIVE REPORT     Performed at [facility]   Report Status PENDING   Incomplete     Studies: Dg Chest 2 View  11/06/2013  CLINICAL DATA:  Sickle cell crisis.  Fever and chills.  Chest pain.  EXAM: CHEST  2 VIEW  COMPARISON:  DG CHEST 2 VIEW dated 10/20/2013; CT ANGIO CHEST W/CM &/OR WO/CM dated 09/24/2013; DG CHEST 2 VIEW dated 09/24/2013  FINDINGS: Cardiomegaly with prominent pulmonary vasculature particularly in the upper zones. Indistinct right middle lobe opacity medially appears increased from prior exam on frontal and lateral projections.  Right power injectable Port-A-Cath noted with tip projecting over the SVC. Left proximal humeral prosthesis noted. Chronic vertebral endplate compressions compatible with sickle cell disease.  IMPRESSION: 1. Cardiomegaly with pulmonary venous hypertension, and indistinct right middle lobe airspace opacity which may reflect early pneumonia or early acute chest syndrome. 2. Osseous findings compatible with sickle cell disease.   Electronically Signed   By: Sherryl Barters M.D.   On: 11/06/2013 14:05   Dg Chest 2 View  10/20/2013   CLINICAL DATA:  28 year old male with chest pain. History of sickle cell disease.  EXAM: CHEST  2 VIEW  COMPARISON:  09/24/2013 chest radiograph and chest CT, and multiple prior chest radiographs.  FINDINGS: Mild cardiomegaly and increased vascularity identified.  Mild interstitial prominence is unchanged.  There is no evidence of focal airspace disease, pulmonary edema, suspicious pulmonary nodule/mass, pleural effusion, or pneumothorax.  No acute bony abnormalities are identified.  A right Port-A-Cath and left shoulder hemiarthroplasty changes again noted.  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  IMPRESSION: Mild cardiomegaly without evidence of acute cardiopulmonary disease.   Electronically Signed   By: Hassan Rowan M.D.   On: 10/20/2013 20:17    Scheduled Meds: . folic acid  1 mg Oral q morning - 10a  . heparin  5,000 Units Subcutaneous 3 times per day  . HYDROmorphone PCA 0.3 mg/mL   Intravenous 6 times per day  . morphine  60 mg Oral BID  . multivitamin with minerals  1 tablet Oral Daily   Continuous Infusions: . sodium chloride 75 mL/hr at 11/07/13 2354   Total time spent 40 minutes.

## 2013-11-08 NOTE — Care Management Note (Addendum)
   CARE MANAGEMENT NOTE 11/08/2013  Patient:  Dennis, Bernard   Account Number:    Date Initiated:  11/08/2013  Documentation initiated by:  Kamylah Manzo  Subjective/Objective Assessment:   28 yo male admitted with Sickle Cell Crisis. PCP: Wilhelmina Mcardle, MD     Action/Plan:   Home when stable   Anticipated DC Date:     Anticipated DC Plan:  [place]  CM consult      Choice offered to / List presented to:  NA   DME arranged  NA      DME agency  NA     [place] arranged  NA      [place] agency  NA   Status of service:  Completed, signed off Medicare Important Message given?   (If response is "NO", the following Medicare IM given date fields will be blank) Date Medicare IM given:   Date Additional Medicare IM given:    Discharge Disposition:    Per UR Regulation:  Reviewed for med. necessity/level of care/duration of stay  If discussed at [place] of Stay Meetings, dates discussed:    Comments:  11/08/13 1250 Dennis Palma,MSN,RN [phone] Chart reviewed for utilization of services. No needs identified at this time.

## 2013-11-08 NOTE — Plan of Care (Signed)
Problem: Phase I Progression Outcomes Goal: Bowel Movement At Least Every 3 Days Outcome: Completed/Met Date Met:  11/08/13 Patient reports having a bowel movement on 2/24-mildly constipated per patient.

## 2013-11-08 NOTE — Plan of Care (Signed)
Problem: Phase I Progression Outcomes Goal: Pain controlled with appropriate interventions Outcome: Progressing 0155 2/25 Pain 8/10 per patient

## 2013-11-09 NOTE — Plan of Care (Signed)
Problem: Phase I Progression Outcomes Goal: Pain controlled with appropriate interventions Outcome: Progressing Still reporting pain 8-9/10 in back and legs.

## 2013-11-09 NOTE — Progress Notes (Signed)
Subjective: Patient admitted with sickle cell painful crisis. He is still complaining of chest tightness, cough and pain at 8/10. He thinks his pain level is better today. No NVD. He has been using his PCA pump and has used 67 mg in 24 hours. He has been able to function when getting his medication.  Objective: Vital signs in last 24 hours: Temp:  [97.9 F (36.6 C)-98.5 F (36.9 C)] 98.5 F (36.9 C) (02/26 1005) Pulse Rate:  [81-104] 91 (02/26 1005) Resp:  [12-18] 16 (02/26 1236) BP: (97-135)/(57-72) 105/68 mmHg (02/26 1005) SpO2:  [96 %-100 %] 97 % (02/26 1236) FiO2 (%):  [28 %] 28 % (02/26 0428) Weight change:  Last BM Date: 11/08/13  Intake/Output from previous day: 02/25 0701 - 02/26 0700 In: 3411.4 [P.O.:2140; I.V.:1171.4; IV Piggyback:100] Out: 3625 [Urine:3625] Intake/Output this shift:    General appearance: alert, cooperative, appears stated age and no distress Eyes: conjunctivae/corneas clear. PERRL, EOM's intact. Fundi benign. Neck: no adenopathy, no carotid bruit, no JVD, supple, symmetrical, trachea midline and thyroid not enlarged, symmetric, no tenderness/mass/nodules Back: symmetric, no curvature. ROM normal. No CVA tenderness. Resp: clear to auscultation bilaterally Cardio: regular rate and rhythm, S1, S2 normal, no murmur, click, rub or gallop GI: soft, non-tender; bowel sounds normal; no masses,  no organomegaly Extremities: extremities normal, atraumatic, no cyanosis or edema Pulses: 2+ and symmetric Skin: Skin color, texture, turgor normal. No rashes or lesions Neurologic: Grossly normal  Lab Results:  Recent Labs  11/07/13 0500 11/08/13 0525  WBC 10.3 8.5  HGB 5.8* 6.0*  HCT 17.5* 17.8*  PLT 431* 431*   BMET  Recent Labs  11/06/13 1835 11/08/13 0525  NA  --  139  K  --  3.6*  CL  --  106  CO2  --  23  GLUCOSE  --  111*  BUN  --  7  CREATININE 0.65 0.58  CALCIUM  --  8.7    Studies/Results: No results found.  Medications: I have  reviewed the patient's current medications.  Assessment/Plan: A 28 yo admitted with HCAP as well as sickle cell Painful crisis.  #1 HCAP: Continue antibiotics. On oxygen but will titrate it off as patient gets better.  #2 Sickle cell painful crisis: patient doing better but still using Dilaudid PCA as well as NSAIDS. No fever, no NVD, No Chest pain.  #3 Sickle Cell anemia: Hemoglobin is low but patient seems stable. Has severe transfusion reactions with antibodies so reluctant to trasfuse unless active severe anemia occurs. Continue to monitor.  #4 Hypokalemia: replete.   LOS: 3 days   Berlene Dixson,LAWAL 11/09/2013, 1:04 PM

## 2013-11-09 NOTE — Plan of Care (Signed)
Problem: Phase I Progression Outcomes Goal: Pulmonary Hygiene as Indicated (Sickle Cell) Outcome: Progressing Encourage IS use.         

## 2013-11-10 LAB — COMPREHENSIVE METABOLIC PANEL
ALK PHOS: 74 U/L (ref 39–117)
ALT: 17 U/L (ref 0–53)
AST: 32 U/L (ref 0–37)
Albumin: 3.2 g/dL — ABNORMAL LOW (ref 3.5–5.2)
BUN: 4 mg/dL — ABNORMAL LOW (ref 6–23)
CO2: 25 mEq/L (ref 19–32)
Calcium: 9 mg/dL (ref 8.4–10.5)
Chloride: 103 mEq/L (ref 96–112)
Creatinine, Ser: 0.55 mg/dL (ref 0.50–1.35)
GFR calc Af Amer: >90 mL/min (ref >90)
GFR calc non Af Amer: >90 mL/min (ref >90)
Glucose, Bld: 91 mg/dL (ref 70–99)
POTASSIUM: 3.8 meq/L (ref 3.7–5.3)
SODIUM: 140 meq/L (ref 137–147)
TOTAL PROTEIN: 6.7 g/dL (ref 6.0–8.3)
Total Bilirubin: 1 mg/dL (ref 0.3–1.2)

## 2013-11-10 LAB — CBC WITH DIFFERENTIAL/PLATELET
BASOS ABS: 0 10*3/uL (ref 0.0–0.1)
BASOS PCT: 0 % (ref 0–1)
EOS ABS: 0.3 10*3/uL (ref 0.0–0.7)
Eosinophils Relative: 2 % (ref 0–5)
HCT: 18.5 % — ABNORMAL LOW (ref 39.0–52.0)
Hemoglobin: 6.2 g/dL — CL (ref 13.0–17.0)
Lymphocytes Relative: 40 % (ref 12–46)
Lymphs Abs: 4.7 10*3/uL — ABNORMAL HIGH (ref 0.7–4.0)
MCH: 32.8 pg (ref 26.0–34.0)
MCHC: 33.5 g/dL (ref 30.0–36.0)
MCV: 97.9 fL (ref 78.0–100.0)
Monocytes Absolute: 1 10*3/uL (ref 0.1–1.0)
Monocytes Relative: 9 % (ref 3–12)
NEUTROS PCT: 49 % (ref 43–77)
Neutro Abs: 5.7 10*3/uL (ref 1.7–7.7)
PLATELETS: 457 10*3/uL — AB (ref 150–400)
RBC: 1.89 MIL/uL — ABNORMAL LOW (ref 4.22–5.81)
RDW: 18.2 % — AB (ref 11.5–15.5)
WBC: 11.7 10*3/uL — ABNORMAL HIGH (ref 4.0–10.5)

## 2013-11-10 NOTE — Progress Notes (Signed)
Subjective: Patient is doing better today with pain level at 6/10. He is able to move around and feels able to function more. Still on the dilaudid PCA and using 43 mg in last 24 hours. No fever, no cough, no SOB. No NVD.  Objective: Vital signs in last 24 hours: Temp:  [97.8 F (36.6 C)-99 F (37.2 C)] 97.8 F (36.6 C) (02/27 1814) Pulse Rate:  [78-89] 89 (02/27 1814) Resp:  [11-17] 16 (02/27 1814) BP: (103-117)/(64-71) 110/64 mmHg (02/27 1814) SpO2:  [90 %-100 %] 100 % (02/27 1814) Weight change:  Last BM Date: 11/08/13  Intake/Output from previous day: 02/26 0701 - 02/27 0700 In: 1591.8 [P.O.:910; I.V.:681.8] Out: 3250 [Urine:3250] Intake/Output this shift:    General appearance: alert, cooperative and no distress Eyes: conjunctivae/corneas clear. PERRL, EOM's intact. Fundi benign. Neck: no adenopathy, no carotid bruit, no JVD, supple, symmetrical, trachea midline and thyroid not enlarged, symmetric, no tenderness/mass/nodules Back: symmetric, no curvature. ROM normal. No CVA tenderness. Resp: clear to auscultation bilaterally Chest wall: no tenderness Cardio: regular rate and rhythm, S1, S2 normal, no murmur, click, rub or gallop GI: soft, non-tender; bowel sounds normal; no masses,  no organomegaly Extremities: extremities normal, atraumatic, no cyanosis or edema Pulses: 2+ and symmetric Skin: Skin color, texture, turgor normal. No rashes or lesions Neurologic: Grossly normal  Lab Results:  Recent Labs  11/08/13 0525 11/10/13 0425  WBC 8.5 11.7*  HGB 6.0* 6.2*  HCT 17.8* 18.5*  PLT 431* 457*   BMET  Recent Labs  11/08/13 0525 11/10/13 0425  NA 139 140  K 3.6* 3.8  CL 106 103  CO2 23 25  GLUCOSE 111* 91  BUN 7 4*  CREATININE 0.58 0.55  CALCIUM 8.7 9.0    Studies/Results: No results found.  Medications: I have reviewed the patient's current medications.  Assessment/Plan: A 28 yo man admitted with HCAP and Sickle cell painful crisis.  #1 HCAP:  Continue antibiotics. Transition to oral antibiotics at DC  #2 Sickle cell Painful crisis: Continue pain control. Deescalate PCA pump in preparation for DC.  #3 Sickle Cell Anemia: H/H is stable. Continue monitoring.  #4 Hypokalemia: Repleted.    LOS: 4 days   GARBA,LAWAL 11/10/2013, 7:20 PM

## 2013-11-11 ENCOUNTER — Encounter: Payer: Self-pay | Admitting: Oncology

## 2013-11-11 MED ORDER — LEVOFLOXACIN 750 MG PO TABS: 750.0000 mg | ORAL_TABLET | Freq: Every day | ORAL | Status: DC

## 2013-11-11 MED ORDER — SODIUM CHLORIDE 0.9 % IJ SOLN: 10.0000 mL | INTRAMUSCULAR | Status: DC | PRN

## 2013-11-11 MED ORDER — HEPARIN SOD (PORK) LOCK FLUSH 100 UNIT/ML IV SOLN
500.0000 [IU] | INTRAVENOUS | Status: DC | PRN
  Filled 2013-11-11: qty 5

## 2013-11-11 MED ORDER — HYDROMORPHONE HCL 2 MG PO TABS: ORAL_TABLET | ORAL | Status: DC

## 2013-11-11 MED ORDER — ALBUTEROL SULFATE HFA 108 (90 BASE) MCG/ACT IN AERS: 2.0000 | INHALATION_SPRAY | Freq: Four times a day (QID) | RESPIRATORY_TRACT | Status: AC | PRN

## 2013-11-11 NOTE — Discharge Summary (Signed)
Physician Discharge Summary  Patient ID: Dennis Bernard MRN: [id number] DOB/AGE: 1986/08/23 28 y.o.  Admit date: 11/06/2013 Discharge date: 11/11/2013  Admission Diagnoses:  Discharge Diagnoses:  Active Problems:   Sickle cell anemia   Sickle-cell crisis with associated acute chest syndrome   Acute sickle cell crisis   HCAP (healthcare-associated pneumonia)   Discharged Condition: good  Hospital Course: A 28 year old gentleman admitted with sickle cell painful crisis, acute chest syndrome D2 healthcare associated pneumonia as well as mild hemolytic crisis. Patient was initially interested down unit. Received IV antibiotics for healthcare associated pneumonia also treated for his acute chest syndrome with oxygenation. He responded to treatment and his pain went from a high of 9-3/10 at the time of discharge. No fever, no nausea vomiting or diarrhea. He was discharged in good health to continue with his home medications. He will also follow with his primary care physician in one to 2 weeks.  Consults: None  Significant Diagnostic Studies: labs: CBC his CMP stat chest x-ray. Chest x-ray showed pneumonia and was treated for healthcare associated pneumonia  Treatments: IV hydration, antibiotics: vancomycin, Zosyn and Levaquin and analgesia: Dilaudid  Discharge Exam: Blood pressure 104/65, pulse 84, temperature 97.8 F (36.6 C), temperature source Oral, resp. rate 12, height 5\' 9"  (1.753 m), weight 77.7 kg (171 lb 4.8 oz), SpO2 99.00%. General appearance: alert, cooperative and no distress Eyes: conjunctivae/corneas clear. PERRL, EOM's intact. Fundi benign. Neck: no adenopathy, no carotid bruit, no JVD, supple, symmetrical, trachea midline and thyroid not enlarged, symmetric, no tenderness/mass/nodules Back: symmetric, no curvature. ROM normal. No CVA tenderness. Resp: clear to auscultation bilaterally Chest wall: no tenderness Cardio: regular rate and rhythm, S1, S2 normal, no  murmur, click, rub or gallop GI: soft, non-tender; bowel sounds normal; no masses,  no organomegaly Extremities: extremities normal, atraumatic, no cyanosis or edema Pulses: 2+ and symmetric Skin: Skin color, texture, turgor normal. No rashes or lesions Neurologic: Grossly normal  Disposition: 01-Home or Self Care     Medication List         albuterol 108 (90 BASE) MCG/ACT inhaler  Commonly known as:  PROVENTIL HFA;VENTOLIN HFA  Inhale 2 puffs into the lungs every 6 (six) hours as needed for wheezing or shortness of breath.     celecoxib 100 MG capsule  Commonly known as:  CELEBREX  Take 1 capsule (100 mg total) by mouth 2 (two) times daily.     diphenhydrAMINE 25 MG tablet  Commonly known as:  BENADRYL  Take 1 tablet (25 mg total) by mouth every 8 (eight) hours as needed for itching.     folic acid 1 MG tablet  Commonly known as:  FOLVITE  Take 1 mg by mouth every morning.     HYDROmorphone 2 MG tablet  Commonly known as:  DILAUDID  Take 4 mg q 4 hours PRN pain for next 48 hours then taper to 2 mg q 4 hours PRN pain     hydroxyurea 500 MG capsule  Commonly known as:  HYDREA  Take 5 capsules (2,500 mg total) by mouth daily. May take with food to minimize GI side effects.     morphine 60 MG 12 hr tablet  Commonly known as:  MS CONTIN  Take 1 tablet (60 mg total) by mouth 2 (two) times daily.     multivitamin with minerals Tabs tablet  Take 1 tablet by mouth daily.     polyethylene glycol packet  Commonly known as:  MIRALAX / GLYCOLAX  Take 17 g  by mouth 2 (two) times daily as needed for mild constipation or moderate constipation.     promethazine 25 MG tablet  Commonly known as:  PHENERGAN  Take 1 tablet (25 mg total) by mouth every 6 (six) hours as needed for nausea.     SENOKOT S 8.6-50 MG per tablet  Generic drug:  senna-docusate  Take 1 tablet by mouth 2 (two) times daily as needed for mild constipation.         SignedBarbette Merino 11/11/2013, 8:09  AM

## 2013-11-11 NOTE — Progress Notes (Signed)
11/11/13 1630  Reviewed discharge instructions with patient. Pt verbalized understanding. Prescriptions given to patient.

## 2013-11-12 LAB — CULTURE, BLOOD (ROUTINE X 2): Culture: NO GROWTH

## 2013-11-13 LAB — CULTURE, BLOOD (ROUTINE X 2): Culture: NO GROWTH

## 2014-01-01 ENCOUNTER — Inpatient Hospital Stay (HOSPITAL_COMMUNITY)
Admission: EM | Admit: 2014-01-01 | Discharge: 2014-01-20 | DRG: 812 | Disposition: A | Discharge status: Home / Self Care | Payer: Medicare Other | Attending: Internal Medicine | Admitting: Internal Medicine

## 2014-01-01 ENCOUNTER — Encounter (HOSPITAL_COMMUNITY): Payer: Self-pay | Admitting: Emergency Medicine

## 2014-01-01 ENCOUNTER — Emergency Department (HOSPITAL_COMMUNITY): Payer: Medicare Other

## 2014-01-01 DIAGNOSIS — R0789 Other chest pain: Secondary | ICD-10-CM

## 2014-01-01 DIAGNOSIS — R7881 Bacteremia: Secondary | ICD-10-CM

## 2014-01-01 DIAGNOSIS — R0902 Hypoxemia: Secondary | ICD-10-CM | POA: Diagnosis not present

## 2014-01-01 DIAGNOSIS — K5909 Other constipation: Secondary | ICD-10-CM

## 2014-01-01 DIAGNOSIS — Z832 Family history of diseases of the blood and blood-forming organs and certain disorders involving the immune mechanism: Secondary | ICD-10-CM

## 2014-01-01 DIAGNOSIS — Z79899 Other long term (current) drug therapy: Secondary | ICD-10-CM

## 2014-01-01 DIAGNOSIS — Z9114 Patient's other noncompliance with medication regimen: Secondary | ICD-10-CM

## 2014-01-01 DIAGNOSIS — T80911A Delayed hemolytic transfusion reaction, unspecified incompatibility, initial encounter: Secondary | ICD-10-CM | POA: Diagnosis not present

## 2014-01-01 DIAGNOSIS — T80A0XA Non-ABO incompatibility reaction due to transfusion of blood or blood products, unspecified, initial encounter: Secondary | ICD-10-CM

## 2014-01-01 DIAGNOSIS — D72829 Elevated white blood cell count, unspecified: Secondary | ICD-10-CM | POA: Diagnosis present

## 2014-01-01 DIAGNOSIS — F411 Generalized anxiety disorder: Secondary | ICD-10-CM | POA: Diagnosis present

## 2014-01-01 DIAGNOSIS — M545 Low back pain, unspecified: Secondary | ICD-10-CM | POA: Diagnosis not present

## 2014-01-01 DIAGNOSIS — G8929 Other chronic pain: Secondary | ICD-10-CM

## 2014-01-01 DIAGNOSIS — I959 Hypotension, unspecified: Secondary | ICD-10-CM | POA: Diagnosis present

## 2014-01-01 DIAGNOSIS — R269 Unspecified abnormalities of gait and mobility: Secondary | ICD-10-CM | POA: Diagnosis present

## 2014-01-01 DIAGNOSIS — R5381 Other malaise: Secondary | ICD-10-CM

## 2014-01-01 DIAGNOSIS — M549 Dorsalgia, unspecified: Secondary | ICD-10-CM

## 2014-01-01 DIAGNOSIS — R5383 Other fatigue: Secondary | ICD-10-CM

## 2014-01-01 DIAGNOSIS — D591 Autoimmune hemolytic anemia, unspecified: Secondary | ICD-10-CM | POA: Diagnosis present

## 2014-01-01 DIAGNOSIS — B9689 Other specified bacterial agents as the cause of diseases classified elsewhere: Secondary | ICD-10-CM | POA: Diagnosis present

## 2014-01-01 DIAGNOSIS — R509 Fever, unspecified: Secondary | ICD-10-CM | POA: Diagnosis present

## 2014-01-01 DIAGNOSIS — N182 Chronic kidney disease, stage 2 (mild): Secondary | ICD-10-CM | POA: Diagnosis present

## 2014-01-01 DIAGNOSIS — R000 Tachycardia, unspecified: Secondary | ICD-10-CM | POA: Diagnosis present

## 2014-01-01 DIAGNOSIS — N179 Acute kidney failure, unspecified: Secondary | ICD-10-CM | POA: Diagnosis not present

## 2014-01-01 DIAGNOSIS — G894 Chronic pain syndrome: Secondary | ICD-10-CM | POA: Diagnosis present

## 2014-01-01 DIAGNOSIS — D571 Sickle-cell disease without crisis: Secondary | ICD-10-CM

## 2014-01-01 DIAGNOSIS — D5700 Hb-SS disease with crisis, unspecified: Principal | ICD-10-CM | POA: Diagnosis present

## 2014-01-01 DIAGNOSIS — Z833 Family history of diabetes mellitus: Secondary | ICD-10-CM

## 2014-01-01 DIAGNOSIS — E871 Hypo-osmolality and hyponatremia: Secondary | ICD-10-CM | POA: Diagnosis present

## 2014-01-01 DIAGNOSIS — R52 Pain, unspecified: Secondary | ICD-10-CM | POA: Diagnosis not present

## 2014-01-01 DIAGNOSIS — Y849 Medical procedure, unspecified as the cause of abnormal reaction of the patient, or of later complication, without mention of misadventure at the time of the procedure: Secondary | ICD-10-CM | POA: Diagnosis not present

## 2014-01-01 DIAGNOSIS — Z96619 Presence of unspecified artificial shoulder joint: Secondary | ICD-10-CM

## 2014-01-01 DIAGNOSIS — K5900 Constipation, unspecified: Secondary | ICD-10-CM | POA: Diagnosis present

## 2014-01-01 DIAGNOSIS — J9819 Other pulmonary collapse: Secondary | ICD-10-CM | POA: Diagnosis present

## 2014-01-01 DIAGNOSIS — F419 Anxiety disorder, unspecified: Secondary | ICD-10-CM

## 2014-01-01 LAB — I-STAT TROPONIN, ED: Troponin i, poc: 0 ng/mL (ref 0.00–0.08)

## 2014-01-01 LAB — COMPREHENSIVE METABOLIC PANEL
ALT: 27 U/L (ref 0–53)
AST: 48 U/L — ABNORMAL HIGH (ref 0–37)
Albumin: 3.8 g/dL (ref 3.5–5.2)
Alkaline Phosphatase: 98 U/L (ref 39–117)
BILIRUBIN TOTAL: 1.5 mg/dL — AB (ref 0.3–1.2)
BUN: 14 mg/dL (ref 6–23)
CO2: 22 meq/L (ref 19–32)
CREATININE: 0.76 mg/dL (ref 0.50–1.35)
Calcium: 9.3 mg/dL (ref 8.4–10.5)
Chloride: 103 mEq/L (ref 96–112)
GFR calc Af Amer: >90 mL/min (ref >90)
Glucose, Bld: 116 mg/dL — ABNORMAL HIGH (ref 70–99)
Potassium: 3.6 mEq/L — ABNORMAL LOW (ref 3.7–5.3)
Sodium: 138 mEq/L (ref 137–147)
Total Protein: 7.5 g/dL (ref 6.0–8.3)

## 2014-01-01 LAB — CBC WITH DIFFERENTIAL/PLATELET
BASOS PCT: 0 % (ref 0–1)
Basophils Absolute: 0 10*3/uL (ref 0.0–0.1)
Eosinophils Absolute: 0.1 10*3/uL (ref 0.0–0.7)
Eosinophils Relative: 1 % (ref 0–5)
HEMATOCRIT: 24.5 % — AB (ref 39.0–52.0)
Hemoglobin: 8.2 g/dL — ABNORMAL LOW (ref 13.0–17.0)
LYMPHS PCT: 25 % (ref 12–46)
Lymphs Abs: 2.4 10*3/uL (ref 0.7–4.0)
MCH: 32 pg (ref 26.0–34.0)
MCHC: 33.5 g/dL (ref 30.0–36.0)
MCV: 95.7 fL (ref 78.0–100.0)
MONO ABS: 0.6 10*3/uL (ref 0.1–1.0)
MONOS PCT: 6 % (ref 3–12)
Neutro Abs: 6.6 10*3/uL (ref 1.7–7.7)
Neutrophils Relative %: 69 % (ref 43–77)
Platelets: 424 10*3/uL — ABNORMAL HIGH (ref 150–400)
RBC: 2.56 MIL/uL — ABNORMAL LOW (ref 4.22–5.81)
RDW: 17.1 % — AB (ref 11.5–15.5)
WBC: 9.6 10*3/uL (ref 4.0–10.5)

## 2014-01-01 LAB — RETICULOCYTES
RBC.: 2.56 MIL/uL — AB (ref 4.22–5.81)
RETIC COUNT ABSOLUTE: 181.8 10*3/uL (ref 19.0–186.0)
RETIC CT PCT: 7.1 % — AB (ref 0.4–3.1)

## 2014-01-01 MED ORDER — FOLIC ACID 1 MG PO TABS
1.0000 mg | ORAL_TABLET | Freq: Every morning | ORAL | Status: DC
  Administered 2014-01-02 – 2014-01-20 (×19): 1 mg via ORAL
  Filled 2014-01-01 (×19): qty 1

## 2014-01-01 MED ORDER — KETOROLAC TROMETHAMINE 30 MG/ML IJ SOLN
30.0000 mg | Freq: Four times a day (QID) | INTRAMUSCULAR | Status: AC
  Administered 2014-01-01 – 2014-01-06 (×19): 30 mg via INTRAVENOUS
  Filled 2014-01-01 (×3): qty 1
  Filled 2014-01-01: qty 2
  Filled 2014-01-01 (×6): qty 1
  Filled 2014-01-01: qty 2
  Filled 2014-01-01 (×6): qty 1
  Filled 2014-01-01: qty 2
  Filled 2014-01-01: qty 1
  Filled 2014-01-01 (×2): qty 2
  Filled 2014-01-01 (×4): qty 1

## 2014-01-01 MED ORDER — ACETAMINOPHEN 650 MG RE SUPP
650.0000 mg | RECTAL | Status: DC | PRN
Start: 2014-01-01 — End: 2014-01-20

## 2014-01-01 MED ORDER — HYDROMORPHONE HCL PF 2 MG/ML IJ SOLN
2.0000 mg | Freq: Once | INTRAMUSCULAR | Status: AC
Start: 2014-01-01 — End: 2014-01-01
  Administered 2014-01-01: 2 mg via INTRAVENOUS
  Filled 2014-01-01: qty 1

## 2014-01-01 MED ORDER — HYDROMORPHONE HCL PF 1 MG/ML IJ SOLN
1.0000 mg | INTRAMUSCULAR | Status: DC | PRN
  Filled 2014-01-01: qty 1

## 2014-01-01 MED ORDER — MORPHINE SULFATE ER 60 MG PO TBCR
60.0000 mg | EXTENDED_RELEASE_TABLET | Freq: Two times a day (BID) | ORAL | Status: DC
  Administered 2014-01-01 – 2014-01-02 (×2): 60 mg via ORAL
  Filled 2014-01-01 (×3): qty 2

## 2014-01-01 MED ORDER — FOLIC ACID 1 MG PO TABS: 1.0000 mg | ORAL_TABLET | Freq: Every day | ORAL | Status: DC

## 2014-01-01 MED ORDER — SODIUM CHLORIDE 0.9 % IV SOLN
INTRAVENOUS | Status: DC
  Administered 2014-01-01: 23:00:00 via INTRAVENOUS

## 2014-01-01 MED ORDER — PROMETHAZINE HCL 25 MG PO TABS: 12.5000 mg | ORAL_TABLET | ORAL | Status: DC | PRN

## 2014-01-01 MED ORDER — DIPHENHYDRAMINE HCL 50 MG/ML IJ SOLN
25.0000 mg | Freq: Once | INTRAMUSCULAR | Status: AC
  Administered 2014-01-01: 25 mg via INTRAVENOUS
  Filled 2014-01-01: qty 1

## 2014-01-01 MED ORDER — POLYETHYLENE GLYCOL 3350 17 G PO PACK
17.0000 g | PACK | Freq: Two times a day (BID) | ORAL | Status: DC | PRN
  Administered 2014-01-03 – 2014-01-12 (×5): 17 g via ORAL
  Filled 2014-01-01 (×4): qty 1

## 2014-01-01 MED ORDER — AMITRIPTYLINE HCL 25 MG PO TABS
25.0000 mg | ORAL_TABLET | Freq: Every day | ORAL | Status: DC
  Administered 2014-01-01 – 2014-01-19 (×19): 25 mg via ORAL
  Filled 2014-01-01 (×20): qty 1

## 2014-01-01 MED ORDER — ALUM & MAG HYDROXIDE-SIMETH 200-200-20 MG/5ML PO SUSP: 15.0000 mL | ORAL | Status: DC | PRN

## 2014-01-01 MED ORDER — ACETAMINOPHEN 325 MG PO TABS
650.0000 mg | ORAL_TABLET | ORAL | Status: DC | PRN
Start: 2014-01-01 — End: 2014-01-20
  Administered 2014-01-02 – 2014-01-17 (×7): 650 mg via ORAL
  Filled 2014-01-01 (×8): qty 2

## 2014-01-01 MED ORDER — ONDANSETRON HCL 4 MG/2ML IJ SOLN
4.0000 mg | Freq: Once | INTRAMUSCULAR | Status: AC
  Administered 2014-01-01: 4 mg via INTRAVENOUS
  Filled 2014-01-01: qty 2

## 2014-01-01 MED ORDER — HYDROMORPHONE HCL PF 2 MG/ML IJ SOLN
2.0000 mg | Freq: Once | INTRAMUSCULAR | Status: AC
  Administered 2014-01-01: 2 mg via INTRAVENOUS
  Filled 2014-01-01: qty 1

## 2014-01-01 MED ORDER — HYDROXYUREA 500 MG PO CAPS
2500.0000 mg | ORAL_CAPSULE | Freq: Every day | ORAL | Status: DC
  Administered 2014-01-02 – 2014-01-04 (×3): 2500 mg via ORAL
  Filled 2014-01-01 (×3): qty 5

## 2014-01-01 MED ORDER — DIPHENHYDRAMINE HCL 25 MG PO CAPS: 25.0000 mg | ORAL_CAPSULE | Freq: Three times a day (TID) | ORAL | Status: DC | PRN

## 2014-01-01 MED ORDER — PANTOPRAZOLE SODIUM 40 MG IV SOLR
40.0000 mg | Freq: Every day | INTRAVENOUS | Status: DC
  Filled 2014-01-01 (×2): qty 40

## 2014-01-01 MED ORDER — ADULT MULTIVITAMIN W/MINERALS CH
1.0000 | ORAL_TABLET | Freq: Every day | ORAL | Status: DC
  Administered 2014-01-02 – 2014-01-20 (×19): 1 via ORAL
  Filled 2014-01-01 (×19): qty 1

## 2014-01-01 MED ORDER — HEPARIN SODIUM (PORCINE) 5000 UNIT/ML IJ SOLN
5000.0000 [IU] | Freq: Three times a day (TID) | INTRAMUSCULAR | Status: DC
  Administered 2014-01-01 – 2014-01-20 (×40): 5000 [IU] via SUBCUTANEOUS
  Filled 2014-01-01 (×60): qty 1

## 2014-01-01 MED ORDER — SORBITOL 70 % SOLN
30.0000 mL | Freq: Every day | Status: DC | PRN
Start: 2014-01-01 — End: 2014-01-20
  Administered 2014-01-14: 30 mL via ORAL
  Filled 2014-01-01 (×2): qty 30

## 2014-01-01 MED ORDER — PANTOPRAZOLE SODIUM 40 MG PO TBEC
40.0000 mg | DELAYED_RELEASE_TABLET | Freq: Every day | ORAL | Status: DC
  Administered 2014-01-02 – 2014-01-20 (×19): 40 mg via ORAL
  Filled 2014-01-01 (×19): qty 1

## 2014-01-01 MED ORDER — ALBUTEROL SULFATE (2.5 MG/3ML) 0.083% IN NEBU: 2.5000 mg | INHALATION_SOLUTION | Freq: Four times a day (QID) | RESPIRATORY_TRACT | Status: DC | PRN

## 2014-01-01 MED ORDER — ALBUTEROL SULFATE HFA 108 (90 BASE) MCG/ACT IN AERS: 2.0000 | INHALATION_SPRAY | Freq: Four times a day (QID) | RESPIRATORY_TRACT | Status: DC | PRN

## 2014-01-01 MED ORDER — LORAZEPAM 2 MG/ML IJ SOLN
0.5000 mg | INTRAMUSCULAR | Status: DC | PRN
  Administered 2014-01-09 (×2): 1 mg via INTRAVENOUS
  Administered 2014-01-11: 0.5 mg via INTRAVENOUS
  Filled 2014-01-01 (×3): qty 1

## 2014-01-01 MED ORDER — LORAZEPAM 0.5 MG PO TABS
0.5000 mg | ORAL_TABLET | ORAL | Status: DC | PRN
Start: 2014-01-01 — End: 2014-01-18
  Administered 2014-01-18: 1 mg via ORAL
  Filled 2014-01-01: qty 2

## 2014-01-01 MED ORDER — HYDROCODONE-ACETAMINOPHEN 5-325 MG PO TABS: 1.0000 | ORAL_TABLET | ORAL | Status: DC | PRN

## 2014-01-01 MED ORDER — PROMETHAZINE HCL 25 MG RE SUPP: 12.5000 mg | RECTAL | Status: DC | PRN

## 2014-01-01 MED ORDER — SODIUM CHLORIDE 0.9 % IV BOLUS (SEPSIS)
1000.0000 mL | Freq: Once | INTRAVENOUS | Status: AC
  Administered 2014-01-01: 1000 mL via INTRAVENOUS

## 2014-01-01 MED ORDER — MAGNESIUM CITRATE PO SOLN: 1.0000 | Freq: Once | ORAL | Status: AC | PRN

## 2014-01-01 MED ORDER — HYDROMORPHONE HCL PF 2 MG/ML IJ SOLN
2.0000 mg | INTRAMUSCULAR | Status: DC | PRN
  Administered 2014-01-01 – 2014-01-02 (×2): 2 mg via INTRAVENOUS
  Filled 2014-01-01 (×2): qty 1

## 2014-01-01 NOTE — H&P (Signed)
[facility] History and Physical  Dennis Bernard FIE:[id number] DOB: [birth year] DOA: 01/01/2014  Referring physician: Abigail Butts, PA-C PCP: Wilhelmina Mcardle, MD   Chief Complaint: SICKLE CELL CRISIS  HPI: Dennis Bernard is a 28 y.o. male with istory of sickle cell anemia presents to the ED with a few days of pain and shortness of breath and sickle cell crisis. Patient has had pain in his chest as well as his shoulder. Patient states that he has noted the pain to be sharp and is typical of his sickle crisis. He states last admission was in February sometime for this. He has been taking his pain medications regularly lately due to increase in his symptoms. No cough noted. No hemoptysis is noted. Has not had any syncope. He has noted some fevers though he is not entirely sure about this. He has no nausea and no vointing. Denies having any abdominal pain.    Review of Systems:  Constitutional:  No weight loss, night sweats, ++Fevers, no chills, fatigue.  HEENT:  No headaches, post nasal drip,  Cardio-vascular:  ++chest pain, ++SOB, ++swelling in lower extremities  GI:  No heartburn, indigestion, abdominal pain, nausea, vomiting, diarrhea, change in bowel habits, loss of appetite  Resp:  ++shortness of breath at rest.No wheezing.No chest wall deformity  Skin:  No complaints GU:  no dysuria, change in color of urine, no urgency or frequency. No flank pain.  Musculoskeletal:  No joint pain or swelling. No decreased range of motion. No back pain.  Psych:  No change in mood or affect. No depression or anxiety. No memory loss.   Past Medical History  Diagnosis Date  . Sickle cell disease   . Avascular necrosis of femur head, right   . H/O allergic rhinitis   . H/O hypokalemia   . Chronic pain syndrome   . H/O wheezing     with colds  . 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 Cholecystectomy  1995    . Portacath placement Left 07/04/2012  . Exchange transfusion  02/2008    perioperatively for shoulder surgery  . 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 Portacath placement Right November 2014   Social History:  reports that he has never smoked. He has never used smokeless tobacco. He reports that he does not drink alcohol or use illicit drugs.  No Known Allergies  Family History  Problem Relation Age of Onset  . Sickle cell anemia Brother   . Sickle cell trait Father   . Sickle cell trait Mother   . Diabetes Mellitus II Mother   . Sickle cell anemia Paternal Uncle   . Asthma Neg Hx   . Allergic rhinitis Neg Hx      Prior to Admission medications   Medication Sig Start Date End Date Taking? Authorizing Provider  albuterol (PROVENTIL HFA;VENTOLIN HFA) 108 (90 BASE) MCG/ACT inhaler Inhale 2 puffs into the lungs every 6 (six) hours as needed for wheezing or shortness of breath. 11/11/13  Yes Elwyn Reach, MD  diphenhydrAMINE (BENADRYL) 25 MG tablet Take 1 tablet (25 mg total) by mouth every 8 (eight) hours as needed for itching. 07/29/13  Yes Robbie Lis, MD  folic acid (FOLVITE) 1 MG tablet Take 1 mg by mouth every morning. 11/27/12  Yes Barton Dubois, MD  HYDROmorphone (DILAUDID) 2 MG tablet Take 2 mg by mouth every 4 (four) hours as needed for severe pain. 11/11/13  Yes Mohammad L  Jonelle Sidle, MD  hydroxyurea (HYDREA) 500 MG capsule Take 5 capsules (2,500 mg total) by mouth daily. May take with food to minimize GI side effects. 10/25/13  Yes Leana Gamer, MD  morphine (MS CONTIN) 60 MG 12 hr tablet Take 1 tablet (60 mg total) by mouth 2 (two) times daily. 10/25/13  Yes Leana Gamer, MD  Multiple Vitamin (MULTIVITAMIN WITH MINERALS) TABS tablet Take 1 tablet by mouth daily. 09/15/13  Yes Robbie Lis, MD  polyethylene glycol [facility] / Floria Raveling) packet Take 17 g by mouth 2 (two) times daily as needed for mild constipation or moderate constipation.   Yes Historical  Provider, MD  promethazine (PHENERGAN) 25 MG tablet Take 1 tablet (25 mg total) by mouth every 6 (six) hours as needed for nausea. 09/15/13  Yes Robbie Lis, MD   Physical Exam: Filed Vitals:   01/01/14 2042  BP: 114/71  Pulse: 87  Temp:   Resp: 16    BP 114/71  Pulse 87  Temp(Src) 98.1 F (36.7 C) (Oral)  Resp 16  Ht 5\' 9"  (1.753 m)  Wt 74.844 kg (165 lb)  BMI 24.36 kg/m2  SpO2 100%  General:  Appears calm and comfortable Eyes: PERRL, normal lids, irises & conjunctiva ENT: grossly normal hearing, lips & tongue Neck: no LAD, masses or thyromegaly Cardiovascular: RRR, positive systolic murmur. Trace LE edema. Respiratory: CTA bilaterally, no w/r/r. Normal respiratory effort. Abdomen: soft, ntnd Skin: ?RLE with some erythema Musculoskeletal: grossly normal tone Psychiatric: grossly normal mood and affect Neurologic: grossly non-focal.          Labs on Admission:  Basic Metabolic Panel:  Recent Labs Lab 01/01/14 1831  NA 138  K 3.6*  CL 103  CO2 22  GLUCOSE 116*  BUN 14  CREATININE 0.76  CALCIUM 9.3   Liver Function Tests:  Recent Labs Lab 01/01/14 1831  AST 48*  ALT 27  ALKPHOS 98  BILITOT 1.5*  PROT 7.5  ALBUMIN 3.8   No results found for this basename: LIPASE, AMYLASE,  in the last 168 hours No results found for this basename: AMMONIA,  in the last 168 hours CBC:  Recent Labs Lab 01/01/14 1831  WBC 9.6  NEUTROABS 6.6  HGB 8.2*  HCT 24.5*  MCV 95.7  PLT 424*   Cardiac Enzymes: No results found for this basename: CKTOTAL, CKMB, CKMBINDEX, TROPONINI,  in the last 168 hours  BNP (last 3 results)  Recent Labs  10/20/13 1935 10/22/13 0300  PROBNP 471.8* 160.5*   CBG: No results found for this basename: GLUCAP,  in the last 168 hours  Radiological Exams on Admission: Dg Chest 2 View  (if Recent History Of Cough Or Chest Pain)  01/01/2014   CLINICAL DATA:  Chest tightness, cough and fever  EXAM: CHEST  2 VIEW  COMPARISON:  Prior  chest x-ray 11/06/2013  FINDINGS: Stable cardiac and mediastinal contours. Very low inspiratory volumes. Similar appearance of mild diffuse interstitial prominence. The central bronchial wall thickening similar compared to prior. No focal airspace consolidation. No pleural effusion or pneumothorax. Surgical changes of prior left shoulder arthroplasty. No acute osseous abnormality.  IMPRESSION: Stable chest x-ray without evidence of acute cardiopulmonary disease.   Electronically Signed   By: Jacqulynn Cadet M.D.   On: 01/01/2014 18:37     Assessment/Plan Principal Problem:   Sickle cell crisis Active Problems:   Chronic pain   1. Acute Sickle cell crisis -will be admitted to the floor for hydration -will provide  pain control -CXR reviewed and is negative -will get blood cultures  2. Sickle Cell Anemia -supportive care -hemoglobin is 8.2 currently  3. Chronic Pain -will continue with pain management    Code Status: Full Code (must indicate code status--if unknown or must be presumed, indicate so) Family Communication: No family (indicate person spoken with, if applicable, with phone number if by telephone) Disposition Plan: Home (indicate anticipated LOS)  Time spent: 61min  Zyquan Crotty A Jeyson Deshotel [facility] Pager [phone]

## 2014-01-01 NOTE — ED Notes (Addendum)
Per pt, having sickle cell pain for 2 days.  Cough and chest tightness with generalized pain

## 2014-01-01 NOTE — ED Provider Notes (Signed)
CSN: [id number]     Arrival date & time 01/01/14  1701 History   First MD Initiated Contact with Patient 01/01/14 1759     Chief Complaint  Patient presents with  . Sickle Cell Pain Crisis     (Consider location/radiation/quality/duration/timing/severity/associated sxs/prior Treatment) The history is provided by the patient and medical records. No language interpreter was used.    Dennis Bernard is a 28 y.o. male  with a hx of sickle cell disease, acute chest, chronic pain syndrome presents to the Emergency Department complaining of gradual, persistent, progressively worsening chest tightness and pain consistent with his usual sickle cell crisis pain onset 2 days ago. Associated symptoms include low grade fever at home also consistent with his typical crisis.  Pt reports taking his baseline MS Contin and breakthrough Dilaudid without relief.  Nothing makes his symptoms better or wrose.  Pt denies chills, headache, neck pain, abd pain, N/V/D, weakness, dizziness, syncope, dysuria, hematuria.     Past Medical History  Diagnosis Date  . Sickle cell disease   . Avascular necrosis of femur head, right   . H/O allergic rhinitis   . H/O hypokalemia   . Chronic pain syndrome   . H/O wheezing     with colds  . 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 Cholecystectomy  1995  . Portacath placement Left 07/04/2012  . Exchange transfusion  02/2008    perioperatively for shoulder surgery  . 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 Portacath placement Right November 2014   Family History  Problem Relation Age of Onset  . Sickle cell anemia Brother   . Sickle cell trait Father   . Sickle cell trait Mother   . Diabetes Mellitus II Mother   . Sickle cell anemia Paternal Uncle   . Asthma Neg Hx   . Allergic rhinitis Neg Hx    History  Substance Use Topics  . Smoking status: Never Smoker   . Smokeless tobacco: Never Used   . Alcohol Use: No    Review of Systems  Constitutional: Positive for fever (subjective). Negative for diaphoresis, appetite change, fatigue and unexpected weight change.  HENT: Negative for mouth sores.   Eyes: Negative for visual disturbance.  Respiratory: Positive for cough and chest tightness. Negative for shortness of breath and wheezing.   Cardiovascular: Negative for chest pain.  Gastrointestinal: Negative for nausea, vomiting, abdominal pain, diarrhea and constipation.  Endocrine: Negative for polydipsia, polyphagia and polyuria.  Genitourinary: Negative for dysuria, urgency, frequency and hematuria.  Musculoskeletal: Negative for back pain and neck stiffness.  Skin: Negative for rash.  Allergic/Immunologic: Negative for immunocompromised state.  Neurological: Negative for syncope, light-headedness and headaches.  Hematological: Does not bruise/bleed easily.  Psychiatric/Behavioral: Negative for sleep disturbance. The patient is not nervous/anxious.       Allergies  Review of patient's allergies indicates no known allergies.  Home Medications   Prior to Admission medications   Medication Sig Start Date End Date Taking? Authorizing Provider  albuterol (PROVENTIL HFA;VENTOLIN HFA) 108 (90 BASE) MCG/ACT inhaler Inhale 2 puffs into the lungs every 6 (six) hours as needed for wheezing or shortness of breath. 11/11/13  Yes Elwyn Reach, MD  diphenhydrAMINE (BENADRYL) 25 MG tablet Take 1 tablet (25 mg total) by mouth every 8 (eight) hours as needed for itching. 07/29/13  Yes Robbie Lis, MD  folic acid (FOLVITE) 1 MG tablet Take 1 mg by mouth every morning. 11/27/12  Yes Barton Dubois, MD  HYDROmorphone (DILAUDID) 2 MG tablet Take 2 mg by mouth every 4 (four) hours as needed for severe pain. 11/11/13  Yes Elwyn Reach, MD  hydroxyurea (HYDREA) 500 MG capsule Take 5 capsules (2,500 mg total) by mouth daily. May take with food to minimize GI side effects. 10/25/13  Yes Leana Gamer, MD  morphine (MS CONTIN) 60 MG 12 hr tablet Take 1 tablet (60 mg total) by mouth 2 (two) times daily. 10/25/13  Yes Leana Gamer, MD  Multiple Vitamin (MULTIVITAMIN WITH MINERALS) TABS tablet Take 1 tablet by mouth daily. 09/15/13  Yes Robbie Lis, MD  polyethylene glycol [facility] / Floria Raveling) packet Take 17 g by mouth 2 (two) times daily as needed for mild constipation or moderate constipation.   Yes Historical Provider, MD  promethazine (PHENERGAN) 25 MG tablet Take 1 tablet (25 mg total) by mouth every 6 (six) hours as needed for nausea. 09/15/13  Yes Robbie Lis, MD   BP 110/69  Pulse 95  Temp(Src) 102.8 F (39.3 C) (Oral)  Resp 21  Ht 5\' 9"  (1.753 m)  Wt 176 lb 2.4 oz (79.9 kg)  BMI 26.00 kg/m2  SpO2 94% Physical Exam  Nursing note and vitals reviewed. Constitutional: He is oriented to person, place, and time. He appears well-developed and well-nourished. No distress.  Awake, alert, nontoxic appearance  HENT:  Head: Normocephalic and atraumatic.  Mouth/Throat: Oropharynx is clear and moist. No oropharyngeal exudate.  Eyes: Conjunctivae are normal. No scleral icterus.  Neck: Normal range of motion. Neck supple.  Cardiovascular: Regular rhythm, normal heart sounds and intact distal pulses.   Tachycardia  Pulmonary/Chest: Effort normal and breath sounds normal. No respiratory distress. He has no wheezes.  Clear and equal breath sounds  Abdominal: Soft. Bowel sounds are normal. He exhibits no mass. There is no tenderness. There is no rebound and no guarding.  Abdomen soft and nontender  Musculoskeletal: Normal range of motion. He exhibits no edema.  No red or swollen joints  Lymphadenopathy:    He has no cervical adenopathy.  Neurological: He is alert and oriented to person, place, and time. He exhibits normal muscle tone. Coordination normal.  Speech is clear and goal oriented Moves extremities without ataxia  Skin: Skin is warm and dry. He is not diaphoretic.  No erythema.  Psychiatric: He has a normal mood and affect.    ED Course  Procedures (including critical care time) Labs Review Labs Reviewed  CBC WITH DIFFERENTIAL - Abnormal; Notable for the following:    RBC 2.56 (*)    Hemoglobin 8.2 (*)    HCT 24.5 (*)    RDW 17.1 (*)    Platelets 424 (*)    All other components within normal limits  COMPREHENSIVE METABOLIC PANEL - Abnormal; Notable for the following:    Potassium 3.6 (*)    Glucose, Bld 116 (*)    AST 48 (*)    Total Bilirubin 1.5 (*)    All other components within normal limits  RETICULOCYTES - Abnormal; Notable for the following:    Retic Ct Pct 7.1 (*)    RBC. 2.56 (*)    All other components within normal limits  BLOOD GAS, ARTERIAL - Abnormal; Notable for the following:    pCO2 arterial 34.3 (*)    pO2, Arterial 76.9 (*)    Acid-base deficit 2.1 (*)    All other components within normal limits  CULTURE, BLOOD (ROUTINE X 2)  CULTURE,  BLOOD (ROUTINE X 2)  URINE CULTURE  CBC WITH DIFFERENTIAL  COMPREHENSIVE METABOLIC PANEL  MAGNESIUM  RETICULOCYTES  I-STAT TROPOININ, ED  I-STAT TROPOININ, ED    Imaging Review Dg Chest 2 View  (if Recent History Of Cough Or Chest Pain)  01/01/2014   CLINICAL DATA:  Chest tightness, cough and fever  EXAM: CHEST  2 VIEW  COMPARISON:  Prior chest x-ray 11/06/2013  FINDINGS: Stable cardiac and mediastinal contours. Very low inspiratory volumes. Similar appearance of mild diffuse interstitial prominence. The central bronchial wall thickening similar compared to prior. No focal airspace consolidation. No pleural effusion or pneumothorax. Surgical changes of prior left shoulder arthroplasty. No acute osseous abnormality.  IMPRESSION: Stable chest x-ray without evidence of acute cardiopulmonary disease.   Electronically Signed   By: Jacqulynn Cadet M.D.   On: 01/01/2014 18:37     EKG Interpretation   Date/Time:  Monday January 01 2014 22:11:08 EDT Ventricular Rate:  82 PR Interval:   156 QRS Duration: 83 QT Interval:  380 QTC Calculation: 444 R Axis:   64 Text Interpretation:  Sinus rhythm borderline nonspecific ST changes No  significant change was found Confirmed by [facility]  MD, TREY ([id number]) on  01/01/2014 10:29:28 PM      MDM   Final diagnoses:  Sickle cell pain crisis  Chest tightness    Demetrious Harrington presents with complaints of chest tightness, cough and low-grade fever. Patient reports the symptoms are typical of his sickle cell crises. On exam he is tachycardic but no other abnormal findings. The lungs are clear and equal and he is not hypoxic. Patient with history of acute chest. Will get chest x-ray and basic labs.  ECG with borderline ST changes, but no overt ischemia.    7:16PM Patient without significant anemia, hemoglobin 8.2 with previous levels around 6.  Reticulocyte count 7.1 the previous levels in the 20s. Chest x-ray without evidence of acute chest syndrome, pneumonia, pneumothorax or pulmonary edema.  Patient is well-appearing. He continues to be in pain. Will redoes Dilaudid 2 mg IV.  I personally reviewed the imaging tests through PACS system  I reviewed available ER/hospitalization records through the EMR  8:53 PM Patient reports pain remains uncontrolled.  Will give Dilaudid 2 mg IV and reassess.  This is patient's third dose of Dilaudid. If this does not help his pain will consult for admission.  9:40 PM Pt reports no relief after third administration of Dilaudid.  Discussed with Dr. Chancy Milroy who will evaluate for admission.  Troponin negative.   The patient was discussed with and seen by Dr. Doy Mince who agrees with the treatment plan.   Jarrett Soho Tajha Sammarco, PA-C 01/02/14 [phone]

## 2014-01-01 NOTE — ED Notes (Signed)
Called floor to tell them pt will be delayed due to troponin being drawn

## 2014-01-02 DIAGNOSIS — Y849 Medical procedure, unspecified as the cause of abnormal reaction of the patient, or of later complication, without mention of misadventure at the time of the procedure: Secondary | ICD-10-CM | POA: Diagnosis not present

## 2014-01-02 DIAGNOSIS — R52 Pain, unspecified: Secondary | ICD-10-CM | POA: Diagnosis not present

## 2014-01-02 DIAGNOSIS — B9689 Other specified bacterial agents as the cause of diseases classified elsewhere: Secondary | ICD-10-CM | POA: Diagnosis present

## 2014-01-02 DIAGNOSIS — M545 Low back pain, unspecified: Secondary | ICD-10-CM | POA: Diagnosis not present

## 2014-01-02 DIAGNOSIS — T80911A Delayed hemolytic transfusion reaction, unspecified incompatibility, initial encounter: Secondary | ICD-10-CM | POA: Diagnosis not present

## 2014-01-02 DIAGNOSIS — Z833 Family history of diabetes mellitus: Secondary | ICD-10-CM | POA: Diagnosis not present

## 2014-01-02 DIAGNOSIS — Z832 Family history of diseases of the blood and blood-forming organs and certain disorders involving the immune mechanism: Secondary | ICD-10-CM | POA: Diagnosis not present

## 2014-01-02 DIAGNOSIS — R0902 Hypoxemia: Secondary | ICD-10-CM | POA: Diagnosis not present

## 2014-01-02 DIAGNOSIS — D5700 Hb-SS disease with crisis, unspecified: Secondary | ICD-10-CM | POA: Diagnosis present

## 2014-01-02 DIAGNOSIS — G894 Chronic pain syndrome: Secondary | ICD-10-CM | POA: Diagnosis present

## 2014-01-02 DIAGNOSIS — R269 Unspecified abnormalities of gait and mobility: Secondary | ICD-10-CM | POA: Diagnosis present

## 2014-01-02 DIAGNOSIS — F411 Generalized anxiety disorder: Secondary | ICD-10-CM | POA: Diagnosis present

## 2014-01-02 DIAGNOSIS — R509 Fever, unspecified: Secondary | ICD-10-CM | POA: Diagnosis present

## 2014-01-02 DIAGNOSIS — D72829 Elevated white blood cell count, unspecified: Secondary | ICD-10-CM | POA: Diagnosis present

## 2014-01-02 DIAGNOSIS — Z79899 Other long term (current) drug therapy: Secondary | ICD-10-CM | POA: Diagnosis not present

## 2014-01-02 DIAGNOSIS — K5900 Constipation, unspecified: Secondary | ICD-10-CM | POA: Diagnosis present

## 2014-01-02 DIAGNOSIS — I959 Hypotension, unspecified: Secondary | ICD-10-CM | POA: Diagnosis present

## 2014-01-02 DIAGNOSIS — E871 Hypo-osmolality and hyponatremia: Secondary | ICD-10-CM | POA: Diagnosis present

## 2014-01-02 DIAGNOSIS — R000 Tachycardia, unspecified: Secondary | ICD-10-CM | POA: Diagnosis present

## 2014-01-02 DIAGNOSIS — J9819 Other pulmonary collapse: Secondary | ICD-10-CM | POA: Diagnosis present

## 2014-01-02 DIAGNOSIS — N182 Chronic kidney disease, stage 2 (mild): Secondary | ICD-10-CM | POA: Diagnosis present

## 2014-01-02 DIAGNOSIS — N179 Acute kidney failure, unspecified: Secondary | ICD-10-CM | POA: Diagnosis not present

## 2014-01-02 DIAGNOSIS — R0789 Other chest pain: Secondary | ICD-10-CM | POA: Diagnosis present

## 2014-01-02 DIAGNOSIS — D591 Autoimmune hemolytic anemia, unspecified: Secondary | ICD-10-CM | POA: Diagnosis present

## 2014-01-02 DIAGNOSIS — Z96619 Presence of unspecified artificial shoulder joint: Secondary | ICD-10-CM | POA: Diagnosis not present

## 2014-01-02 LAB — CBC WITH DIFFERENTIAL/PLATELET
BASOS ABS: 0 10*3/uL (ref 0.0–0.1)
Basophils Relative: 0 % (ref 0–1)
EOS PCT: 0 % (ref 0–5)
Eosinophils Absolute: 0 10*3/uL (ref 0.0–0.7)
HCT: 22.2 % — ABNORMAL LOW (ref 39.0–52.0)
Hemoglobin: 7.5 g/dL — ABNORMAL LOW (ref 13.0–17.0)
LYMPHS ABS: 1.3 10*3/uL (ref 0.7–4.0)
Lymphocytes Relative: 5 % — ABNORMAL LOW (ref 12–46)
MCH: 32.6 pg (ref 26.0–34.0)
MCHC: 33.8 g/dL (ref 30.0–36.0)
MCV: 96.5 fL (ref 78.0–100.0)
Monocytes Absolute: 0.8 10*3/uL (ref 0.1–1.0)
Monocytes Relative: 3 % (ref 3–12)
NEUTROS PCT: 92 % — AB (ref 43–77)
Neutro Abs: 24.2 10*3/uL — ABNORMAL HIGH (ref 1.7–7.7)
PLATELETS: ADEQUATE 10*3/uL (ref 150–400)
RBC: 2.3 MIL/uL — ABNORMAL LOW (ref 4.22–5.81)
RDW: 17.3 % — AB (ref 11.5–15.5)
WBC: 26.3 10*3/uL — ABNORMAL HIGH (ref 4.0–10.5)

## 2014-01-02 LAB — MAGNESIUM: Magnesium: 1.6 mg/dL (ref 1.5–2.5)

## 2014-01-02 LAB — COMPREHENSIVE METABOLIC PANEL
ALT: 45 U/L (ref 0–53)
AST: 76 U/L — ABNORMAL HIGH (ref 0–37)
Albumin: 3.2 g/dL — ABNORMAL LOW (ref 3.5–5.2)
Alkaline Phosphatase: 101 U/L (ref 39–117)
BILIRUBIN TOTAL: 2 mg/dL — AB (ref 0.3–1.2)
BUN: 16 mg/dL (ref 6–23)
CHLORIDE: 104 meq/L (ref 96–112)
CO2: 21 mEq/L (ref 19–32)
Calcium: 8.6 mg/dL (ref 8.4–10.5)
Creatinine, Ser: 1.77 mg/dL — ABNORMAL HIGH (ref 0.50–1.35)
GFR calc Af Amer: 58 mL/min — ABNORMAL LOW (ref >90)
GFR calc non Af Amer: 50 mL/min — ABNORMAL LOW (ref >90)
Glucose, Bld: 91 mg/dL (ref 70–99)
Potassium: 3.8 mEq/L (ref 3.7–5.3)
Sodium: 139 mEq/L (ref 137–147)
Total Protein: 6.4 g/dL (ref 6.0–8.3)

## 2014-01-02 LAB — URINE MICROSCOPIC-ADD ON

## 2014-01-02 LAB — RETICULOCYTES
RBC.: 2.3 MIL/uL — AB (ref 4.22–5.81)
Retic Count, Absolute: 174.8 10*3/uL (ref 19.0–186.0)
Retic Ct Pct: 7.6 % — ABNORMAL HIGH (ref 0.4–3.1)

## 2014-01-02 LAB — URINALYSIS, ROUTINE W REFLEX MICROSCOPIC
Glucose, UA: NEGATIVE mg/dL
Hgb urine dipstick: NEGATIVE
Ketones, ur: NEGATIVE mg/dL
Nitrite: NEGATIVE
PROTEIN: NEGATIVE mg/dL
Specific Gravity, Urine: 1.018 (ref 1.005–1.030)
UROBILINOGEN UA: 1 mg/dL (ref 0.0–1.0)
pH: 5 (ref 5.0–8.0)

## 2014-01-02 LAB — BLOOD GAS, ARTERIAL
ACID-BASE DEFICIT: 2.1 mmol/L — AB (ref 0.0–2.0)
Bicarbonate: 21 mEq/L (ref 20.0–24.0)
Drawn by: 318141
FIO2: 0.21 %
O2 Saturation: 94.8 %
PCO2 ART: 34.3 mmHg — AB (ref 35.0–45.0)
Patient temperature: 102.8
TCO2: 20 mmol/L (ref 0–100)
pH, Arterial: 7.416 (ref 7.350–7.450)
pO2, Arterial: 76.9 mmHg — ABNORMAL LOW (ref 80.0–100.0)

## 2014-01-02 MED ORDER — HYDROMORPHONE 0.3 MG/ML IV SOLN
INTRAVENOUS | Status: DC
  Administered 2014-01-02: 2.79 mg via INTRAVENOUS

## 2014-01-02 MED ORDER — DIPHENHYDRAMINE HCL 12.5 MG/5ML PO ELIX: 12.5000 mg | ORAL_SOLUTION | Freq: Four times a day (QID) | ORAL | Status: DC | PRN

## 2014-01-02 MED ORDER — SODIUM CHLORIDE 0.9 % IV BOLUS (SEPSIS)
500.0000 mL | Freq: Once | INTRAVENOUS | Status: AC
  Administered 2014-01-02: 500 mL via INTRAVENOUS

## 2014-01-02 MED ORDER — NALOXONE HCL 0.4 MG/ML IJ SOLN: 0.4000 mg | INTRAMUSCULAR | Status: DC | PRN

## 2014-01-02 MED ORDER — HYDROMORPHONE 0.3 MG/ML IV SOLN
INTRAVENOUS | Status: DC
  Administered 2014-01-02: 3.29 mg via INTRAVENOUS
  Administered 2014-01-02: 6.75 mg via INTRAVENOUS

## 2014-01-02 MED ORDER — HYDROMORPHONE 0.3 MG/ML IV SOLN
INTRAVENOUS | Status: DC
  Administered 2014-01-02: 03:00:00 via INTRAVENOUS
  Administered 2014-01-02: 0.7 mg via INTRAVENOUS
  Filled 2014-01-02: qty 25

## 2014-01-02 MED ORDER — HYDROMORPHONE HCL PF 2 MG/ML IJ SOLN
2.0000 mg | Freq: Once | INTRAMUSCULAR | Status: AC
  Administered 2014-01-02: 2 mg via INTRAVENOUS
  Filled 2014-01-02: qty 1

## 2014-01-02 MED ORDER — HYDROMORPHONE 0.3 MG/ML IV SOLN
INTRAVENOUS | Status: DC
  Administered 2014-01-02 (×2): via INTRAVENOUS
  Administered 2014-01-02: 1.08 mg via INTRAVENOUS
  Administered 2014-01-02: 1.6 mg via INTRAVENOUS
  Administered 2014-01-02: 3.53 mg via INTRAVENOUS
  Filled 2014-01-02 (×2): qty 25

## 2014-01-02 MED ORDER — DIPHENHYDRAMINE HCL 25 MG PO CAPS
25.0000 mg | ORAL_CAPSULE | Freq: Four times a day (QID) | ORAL | Status: DC | PRN
  Administered 2014-01-02 – 2014-01-03 (×3): 25 mg via ORAL
  Administered 2014-01-18: 50 mg via ORAL
  Filled 2014-01-02: qty 1
  Filled 2014-01-02: qty 2
  Filled 2014-01-02 (×2): qty 1

## 2014-01-02 MED ORDER — HYDROMORPHONE 0.3 MG/ML IV SOLN
INTRAVENOUS | Status: DC
  Administered 2014-01-02: 5.59 mg via INTRAVENOUS
  Administered 2014-01-03 (×2): via INTRAVENOUS
  Administered 2014-01-03: 5.87 mg via INTRAVENOUS
  Administered 2014-01-03 (×4): via INTRAVENOUS
  Administered 2014-01-03: 14.12 mg via INTRAVENOUS
  Administered 2014-01-04: 04:00:00 via INTRAVENOUS
  Administered 2014-01-04: 7.6 mg via INTRAVENOUS
  Administered 2014-01-04: 7.77 mg via INTRAVENOUS
  Administered 2014-01-04 (×4): via INTRAVENOUS
  Administered 2014-01-04: 9.35 mg via INTRAVENOUS
  Filled 2014-01-02 (×11): qty 25

## 2014-01-02 MED ORDER — ONDANSETRON HCL 4 MG/2ML IJ SOLN
4.0000 mg | Freq: Four times a day (QID) | INTRAMUSCULAR | Status: DC | PRN
Start: 2014-01-02 — End: 2014-01-09
  Administered 2014-01-02 – 2014-01-05 (×2): 4 mg via INTRAVENOUS
  Filled 2014-01-02 (×2): qty 2

## 2014-01-02 MED ORDER — DIPHENHYDRAMINE HCL 50 MG/ML IJ SOLN
12.5000 mg | Freq: Four times a day (QID) | INTRAMUSCULAR | Status: DC | PRN
  Administered 2014-01-02 (×2): 12.5 mg via INTRAVENOUS
  Filled 2014-01-02 (×2): qty 1

## 2014-01-02 MED ORDER — SODIUM CHLORIDE 0.9 % IJ SOLN: 9.0000 mL | INTRAMUSCULAR | Status: DC | PRN

## 2014-01-02 MED ORDER — MORPHINE SULFATE ER 30 MG PO TBCR
60.0000 mg | EXTENDED_RELEASE_TABLET | Freq: Three times a day (TID) | ORAL | Status: DC
  Administered 2014-01-02 – 2014-01-20 (×52): 60 mg via ORAL
  Filled 2014-01-02 (×4): qty 2
  Filled 2014-01-02: qty 4
  Filled 2014-01-02 (×8): qty 2
  Filled 2014-01-02: qty 4
  Filled 2014-01-02 (×5): qty 2
  Filled 2014-01-02: qty 4
  Filled 2014-01-02 (×4): qty 2
  Filled 2014-01-02: qty 4
  Filled 2014-01-02 (×2): qty 2
  Filled 2014-01-02: qty 4
  Filled 2014-01-02 (×2): qty 2
  Filled 2014-01-02: qty 4
  Filled 2014-01-02 (×3): qty 2
  Filled 2014-01-02: qty 4
  Filled 2014-01-02 (×9): qty 2
  Filled 2014-01-02: qty 4
  Filled 2014-01-02 (×7): qty 2
  Filled 2014-01-02: qty 4

## 2014-01-02 MED ORDER — DEXTROSE-NACL 5-0.45 % IV SOLN
INTRAVENOUS | Status: DC
  Administered 2014-01-02 (×2): via INTRAVENOUS
  Administered 2014-01-03: 1000 mL via INTRAVENOUS
  Administered 2014-01-04: 07:00:00 via INTRAVENOUS
  Administered 2014-01-04: 75 mL/h via INTRAVENOUS
  Administered 2014-01-05 – 2014-01-06 (×2): 1000 mL via INTRAVENOUS

## 2014-01-02 MED ORDER — HYDROMORPHONE 0.3 MG/ML IV SOLN
INTRAVENOUS | Status: DC
  Administered 2014-01-02: 11:00:00 via INTRAVENOUS
  Filled 2014-01-02: qty 25

## 2014-01-02 MED ORDER — SENNOSIDES-DOCUSATE SODIUM 8.6-50 MG PO TABS
1.0000 | ORAL_TABLET | Freq: Two times a day (BID) | ORAL | Status: DC
  Administered 2014-01-02 – 2014-01-20 (×36): 1 via ORAL
  Filled 2014-01-02 (×51): qty 1

## 2014-01-02 MED ORDER — HYDROMORPHONE 0.3 MG/ML IV SOLN: INTRAVENOUS | Status: DC

## 2014-01-02 MED ORDER — SODIUM CHLORIDE 0.9 % IJ SOLN
10.0000 mL | Freq: Two times a day (BID) | INTRAMUSCULAR | Status: DC
  Administered 2014-01-02 – 2014-01-15 (×20): 10 mL

## 2014-01-02 MED ORDER — SODIUM CHLORIDE 0.9 % IJ SOLN
10.0000 mL | INTRAMUSCULAR | Status: DC | PRN
  Administered 2014-01-13 – 2014-01-20 (×14): 10 mL

## 2014-01-02 NOTE — Progress Notes (Signed)
SICKLE CELL SERVICE PROGRESS NOTE  Dennis Bernard NFA:[id number] DOB: 1986/05/30 DOA: 01/01/2014 PCP: Wilhelmina Mcardle, MD  Assessment/Plan: Principal Problem:   Sickle cell crisis Active Problems:   Chronic pain  1. Hypotension: Pt clinically looks well. He reports that his BP can go as low as 80 systolic when in crisis. Pt appears clinically stable and is able to talk in full sentences. Will continue to monitor patient. This is not a reflection of sepsis, and 12 lead EKG shows non-specific ST-T wave changes. Pt had a recent echo in 10/2013 which showed normal LVF. Pt does not appear volume depleted and has good urine output by his admission. 2. Fever: So far no evidence of infection. Will send urinalysis. If has fever again will obtain cultures. May be secondary to crisis. 3. Hb SS with crisis: Pt reports that he was placed on MS Contin 30 mg every 8 hours. I confirmed this with Dr. Sammuel Hines in [place] who reports that upon discharge from [facility] he was placed on MS Contin 60 mg every 8 hours and Dilaudid 4 mg every 4 hours as needed. Pt was also noted to be on Hydrea 2000 mg.  4. Leukocytosis: Etiology unclear will continue to monitor. This is a certain indication that patient is non-compliant with his Hydrea. He had a fever last night but no overt evidence of infection. Will continue to monitor. 5. Anemia: Pt was hospitalized from 3/30 until 4/14 at [facility] in [place] and  transfused one unit of blood on 7/14. I will check an LDH to ensure that he is not having a late transfusion reaction. Pt has severe antibodies and should not be transfused unless he has clinical need for transfusion and not because of a number.  Code Status: Full Code  Family Communication: N/A Disposition Plan: Not yet ready for discharge  Leana Gamer  Pager [phone]. If 7PM-7AM, please contact night-coverage.  01/02/2014, 10:04 AM  LOS: 1 day   Brief narrative: Dennis Bernard is a 28 y.o. male with istory of  sickle cell anemia presents to the ED with a few days of pain and shortness of breath and sickle cell crisis. Patient has had pain in his chest as well as his shoulder. Patient states that he has noted the pain to be sharp and is typical of his sickle crisis. He states last admission was in February sometime for this. He has been taking his pain medications regularly lately due to increase in his symptoms. No cough noted. No hemoptysis is noted. Has not had any syncope. He has noted some fevers though he is not entirely sure about this. He has no nausea and no vointing. Denies having any abdominal pain   Consultants:  None  Procedures:  None  Antibiotics:  None  HPI/Subjective: Pt states that pain in lower back and left arm. He rates pain at 9/10. Pt describes pain as sharp and aching and non-radiating. Pt also c/o chest tightness but has no hypoxemia or increase work of breathing.   Pt states that his medications have been changed by Dr. Daphane Shepherd in [place] to MS Contin 60 mg TID, and Dilaudid 4 mg 4-6 hours PRN  . He denies any dizziness. Last BM yesterday.  Objective: Filed Vitals:   01/02/14 0500 01/02/14 0651 01/02/14 0727 01/02/14 0915  BP: 83/40 84/38  80/32  Pulse: 102   89  Temp: 98.3 F (36.8 C)   97.9 F (36.6 C)  TempSrc: Oral   Oral  Resp: 20  16  19  Height:      Weight:      SpO2: 95%  92% 97%   Weight change:   Intake/Output Summary (Last 24 hours) at 01/02/14 1004 Last data filed at 01/02/14 0900  Gross per 24 hour  Intake   1740 ml  Output    400 ml  Net   1340 ml    General: Alert, awake, oriented x3, in no acute distress. Pt clinically well appearing and without any conversation dyspnea or increased work of breathing.  HEENT: [facility]/AT PEERL, EOMI, anicteric Neck: Trachea midline,  no masses, no thyromegal,y no JVD, no carotid bruit OROPHARYNX:  Moist, No exudate/ erythema/lesions.  Heart: Regular rate and rhythm, without murmurs, rubs, gallops.  Lungs:  Clear to auscultation, no wheezing or rhonchi noted.  Abdomen: Soft, nontender, nondistended, positive bowel sounds, no masses no hepatosplenomegaly noted.  Neuro: No focal neurological deficits noted cranial nerves II through XII grossly intact.  Strength normal in bilateral upper and lower extremities. Musculoskeletal: No warm swelling or erythema around joints, no spinal tenderness noted. Psychiatric: Patient alert and oriented x3, good insight and cognition, good recent to remote recall. Lymph node survey: No cervical axillary or inguinal lymphadenopathy noted.   Data Reviewed: Basic Metabolic Panel:  Recent Labs Lab 01/01/14 1831 01/02/14 0620  NA 138 139  K 3.6* 3.8  CL 103 104  CO2 22 21  GLUCOSE 116* 91  BUN 14 16  CREATININE 0.76 1.77*  CALCIUM 9.3 8.6  MG  --  1.6   Liver Function Tests:  Recent Labs Lab 01/01/14 1831 01/02/14 0620  AST 48* 76*  ALT 27 45  ALKPHOS 98 101  BILITOT 1.5* 2.0*  PROT 7.5 6.4  ALBUMIN 3.8 3.2*   No results found for this basename: LIPASE, AMYLASE,  in the last 168 hours No results found for this basename: AMMONIA,  in the last 168 hours CBC:  Recent Labs Lab 01/01/14 1831 01/02/14 0620  WBC 9.6 26.3*  NEUTROABS 6.6 24.2*  HGB 8.2* 7.5*  HCT 24.5* 22.2*  MCV 95.7 96.5  PLT 424* PLATELET CLUMPS NOTED ON SMEAR, COUNT APPEARS ADEQUATE   Cardiac Enzymes: No results found for this basename: CKTOTAL, CKMB, CKMBINDEX, TROPONINI,  in the last 168 hours BNP (last 3 results)  Recent Labs  10/20/13 1935 10/22/13 0300  PROBNP 471.8* 160.5*   CBG: No results found for this basename: GLUCAP,  in the last 168 hours  No results found for this or any previous visit (from the past 240 hour(s)).   Studies: Dg Chest 2 View  (if Recent History Of Cough Or Chest Pain)  01/01/2014   CLINICAL DATA:  Chest tightness, cough and fever  EXAM: CHEST  2 VIEW  COMPARISON:  Prior chest x-ray 11/06/2013  FINDINGS: Stable cardiac and  mediastinal contours. Very low inspiratory volumes. Similar appearance of mild diffuse interstitial prominence. The central bronchial wall thickening similar compared to prior. No focal airspace consolidation. No pleural effusion or pneumothorax. Surgical changes of prior left shoulder arthroplasty. No acute osseous abnormality.  IMPRESSION: Stable chest x-ray without evidence of acute cardiopulmonary disease.   Electronically Signed   By: Jacqulynn Cadet M.D.   On: 01/01/2014 18:37    Scheduled Meds: . amitriptyline  25 mg Oral QHS  . folic acid  1 mg Oral q morning - 10a  . heparin  5,000 Units Subcutaneous 3 times per day  . HYDROmorphone PCA 0.3 mg/mL   Intravenous 6 times per day  .  hydroxyurea  2,500 mg Oral Daily  . ketorolac  30 mg Intravenous 4 times per day  . morphine  60 mg Oral BID  . multivitamin with minerals  1 tablet Oral Daily  . pantoprazole  40 mg Oral Daily   Or  . pantoprazole (PROTONIX) IV  40 mg Intravenous Daily   Continuous Infusions: . sodium chloride Stopped (01/02/14 0946)    Total time spent 56 minutes

## 2014-01-02 NOTE — Progress Notes (Signed)
Pt's pain not adequately controlled with 2mg  dilaudid q 2 hours PRN. PA on call, [place], notified and orders received for custom dose PCA. Orders implemented, and this RN to continue to monitor. Noreene Larsson RN

## 2014-01-02 NOTE — Progress Notes (Signed)
Lab was unable to draw blood cultures and other labs at this time due to lack of venous access. Labs reordered, per their request at 0600 to allow for rehydration with IV fluids. Noreene Larsson RN

## 2014-01-02 NOTE — ED Provider Notes (Signed)
Medical screening examination/treatment/procedure(s) were conducted as a shared visit with non-physician practitioner(s) and myself.  I personally evaluated the patient during the encounter.   Please see my separate note.     Houston Siren III, MD 01/02/14 [phone]

## 2014-01-02 NOTE — Progress Notes (Addendum)
Pt c/o chills, temperature taken was 100.5. Urine cultures ordered per previous electronic order, order already in place for blood cultures to be drawn. Pt denies productive cough, so no sputum cultures were ordered by this RN. Tylenol given. This RN to continue to monitor. Noreene Larsson RN

## 2014-01-02 NOTE — Progress Notes (Signed)
UR completed. Patient changed to inpatient- requiring PCA and IVF @ 75cc/hr

## 2014-01-02 NOTE — Progress Notes (Signed)
Peripherally Inserted Central Catheter/Midline Placement  The IV Nurse has discussed with the patient and/or persons authorized to consent for the patient, the purpose of this procedure and the potential benefits and risks involved with this procedure.  The benefits include less needle sticks, lab draws from the catheter and patient may be discharged home with the catheter.  Risks include, but not limited to, infection, bleeding, blood clot (thrombus formation), and puncture of an artery; nerve damage and irregular heat beat.  Alternatives to this procedure were also discussed.  PICC/Midline Placement Documentation        Dennis Bernard 01/02/2014, 11:49 AM

## 2014-01-02 NOTE — Progress Notes (Signed)
Patient has low BP 80/32 was checked manually. Patient is asymptomatic,no c/o dizziness, encouraged po intake, verbalized understanding,no c/o SOB  O2 Sat 97% on RA,PR 66,ZL93. Dr. Zigmund Daniel made aware, new orders noted.Will continue monitor patient.- Sandie Ano RN

## 2014-01-02 NOTE — Progress Notes (Signed)
Pt's manual BP this AM was 84/38. On call PA notified, awaiting new orders. [place], [place] stated she would contact pharmacy to decrease PCA dose. Noreene Larsson RN

## 2014-01-02 NOTE — ED Provider Notes (Signed)
Medical screening examination/treatment/procedure(s) were conducted as a shared visit with non-physician practitioner(s) and myself.  I personally evaluated the patient during the encounter.   EKG Interpretation   Date/Time:  Monday January 01 2014 22:11:08 EDT Ventricular Rate:  82 PR Interval:  156 QRS Duration: 83 QT Interval:  380 QTC Calculation: 444 R Axis:   64 Text Interpretation:  Sinus rhythm borderline nonspecific ST changes No  significant change was found Confirmed by [facility]  MD, TREY ([id number]) on  01/01/2014 10:29:28 PM      28 yo male with hx of SCD presenting with chest, low back, and leg pain.  On exam, well appearing, lungs CTAB, heart sound normal with RRR.  Pain uncontrolled after multiple doses of dilaudid.  Plan admission for further pain control.    Clinical Impression: 1. Sickle cell pain crisis   2. Chest tightness       Houston Siren III, MD 01/02/14 1234

## 2014-01-03 LAB — COMPREHENSIVE METABOLIC PANEL
ALT: 39 U/L (ref 0–53)
AST: 44 U/L — ABNORMAL HIGH (ref 0–37)
Albumin: 3.2 g/dL — ABNORMAL LOW (ref 3.5–5.2)
Alkaline Phosphatase: 105 U/L (ref 39–117)
BILIRUBIN TOTAL: 2.8 mg/dL — AB (ref 0.3–1.2)
BUN: 26 mg/dL — ABNORMAL HIGH (ref 6–23)
CHLORIDE: 107 meq/L (ref 96–112)
CO2: 23 meq/L (ref 19–32)
Calcium: 8.4 mg/dL (ref 8.4–10.5)
Creatinine, Ser: 1.41 mg/dL — ABNORMAL HIGH (ref 0.50–1.35)
GFR, EST AFRICAN AMERICAN: 77 mL/min — AB (ref >90)
GFR, EST NON AFRICAN AMERICAN: 66 mL/min — AB (ref >90)
GLUCOSE: 114 mg/dL — AB (ref 70–99)
POTASSIUM: 4.2 meq/L (ref 3.7–5.3)
SODIUM: 141 meq/L (ref 137–147)
Total Protein: 6.4 g/dL (ref 6.0–8.3)

## 2014-01-03 LAB — CBC WITH DIFFERENTIAL/PLATELET
Basophils Absolute: 0 10*3/uL (ref 0.0–0.1)
Basophils Relative: 0 % (ref 0–1)
Eosinophils Absolute: 0.4 10*3/uL (ref 0.0–0.7)
Eosinophils Relative: 3 % (ref 0–5)
HCT: 19.1 % — ABNORMAL LOW (ref 39.0–52.0)
Hemoglobin: 6.6 g/dL — CL (ref 13.0–17.0)
LYMPHS ABS: 3.8 10*3/uL (ref 0.7–4.0)
LYMPHS PCT: 23 % (ref 12–46)
MCH: 32.7 pg (ref 26.0–34.0)
MCHC: 34.6 g/dL (ref 30.0–36.0)
MCV: 94.6 fL (ref 78.0–100.0)
Monocytes Absolute: 0.4 10*3/uL (ref 0.1–1.0)
Monocytes Relative: 3 % (ref 3–12)
Neutro Abs: 11.9 10*3/uL — ABNORMAL HIGH (ref 1.7–7.7)
Neutrophils Relative %: 72 % (ref 43–77)
PLATELETS: 281 10*3/uL (ref 150–400)
RBC: 2.02 MIL/uL — AB (ref 4.22–5.81)
RDW: 16.8 % — ABNORMAL HIGH (ref 11.5–15.5)
WBC: 16.5 10*3/uL — AB (ref 4.0–10.5)

## 2014-01-03 LAB — URINE CULTURE
Colony Count: NO GROWTH
Culture: NO GROWTH
SPECIAL REQUESTS: NORMAL

## 2014-01-03 NOTE — Progress Notes (Signed)
CRITICAL VALUE ALERT  Critical value received: Hgb 6.6  Date of notification:  01/03/14   Time of notification:  0514  Critical value read back:yes  Nurse who received alert: Noreene Larsson RN  MD notified (1st page):  Imogene Burn - PA  Time of first page: 0520   Responding MD:  Imogene Burn - PA  Time MD responded: [phone]

## 2014-01-03 NOTE — Progress Notes (Signed)
SICKLE CELL SERVICE PROGRESS NOTE  Cleburne Savini LOV:[id number] DOB: Jun 04, 1986 DOA: 01/01/2014 PCP: Wilhelmina Mcardle, MD  Assessment/Plan: Principal Problem:   Sickle cell crisis Active Problems:   Chronic pain  1. Hypotension: Pt clinically looks well and appears clinically stable and is able to talk in full sentences. Will continue to monitor patient. This may be a reflection of sepsis but without overt source will observe off of antibiotics. A 12 lead EKG shows non-specific ST-T wave changes. Pt had a recent echo in 10/2013 which showed normal LVF. Pt does not appear volume depleted and has good urine output by his admission. 2. Fever: So far no evidence of infection. Will send urinalysis. If has fever again will obtain cultures will continue to observe closely for signs of infection. May also be secondary to crisis. 3. Hb SS with crisis: Pt reports that he was placed on MS Contin 30 mg every 8 hours. I confirmed this with Dr. Sammuel Hines in [place] who reports that upon discharge from [facility] he was placed on MS Contin 60 mg every 8 hours and Dilaudid 4 mg every 4 hours as needed. Pt was also noted to be on Hydrea 2000 mg.  I will continue PAC at current dose and continue Toradol.  4. Leukocytosis: Etiology unclear will continue to monitor. This is a certain indication that patient is non-compliant with his Hydrea. He had a fever last night but no overt evidence of infection. Will continue to monitor. 5. Anemia: Pt was hospitalized from 3/30 until 4/14 at [facility] in [place] and  transfused one unit of blood on 7/14. I will check an LDH to ensure that he is not having a late transfusion reaction. Pt has severe antibodies and should not be transfused unless he has clinical need for transfusion and not because of a number.  Code Status: Full Code  Family Communication: N/A Disposition Plan: Not yet ready for discharge  Leana Gamer  Pager [phone]. If 7PM-7AM, please contact  night-coverage.  01/03/2014, 7:27 PM  LOS: 2 days   Brief narrative: Zeek Rostron is a 28 y.o. male with istory of sickle cell anemia presents to the ED with a few days of pain and shortness of breath and sickle cell crisis. Patient has had pain in his chest as well as his shoulder. Patient states that he has noted the pain to be sharp and is typical of his sickle crisis. He states last admission was in February sometime for this. He has been taking his pain medications regularly lately due to increase in his symptoms. No cough noted. No hemoptysis is noted. Has not had any syncope. He has noted some fevers though he is not entirely sure about this. He has no nausea and no vointing. Denies having any abdominal pain   Consultants:  None  Procedures:  None  Antibiotics:  None  HPI/Subjective: Pt states that pain in lower back and left arm. He rates pain at 9/10. Pt describes pain as sharp and aching and non-radiating. Pt also c/o chest tightness but has no hypoxemia or increase work of breathing.   Pt states that his medications have been changed by Dr. Daphane Shepherd in [place] to MS Contin 60 mg TID, and Dilaudid 4 mg 4-6 hours PRN  . He denies any dizziness. Last BM yesterday.  Objective: Filed Vitals:   01/03/14 1225 01/03/14 1350 01/03/14 1619 01/03/14 1844  BP:  118/74  107/62  Pulse:  103  96  Temp:  98.7 F (37.1 C)  97.3 F (36.3 C)  TempSrc:  Oral  Oral  Resp: 20 16 16 16   Height:      Weight:      SpO2: 96% 97% 95% 96%   Weight change: 17 lb 15.7 oz (8.157 kg)  Intake/Output Summary (Last 24 hours) at 01/03/14 1927 Last data filed at 01/03/14 1716  Gross per 24 hour  Intake 1682.5 ml  Output   2600 ml  Net -917.5 ml    General: Alert, awake, oriented x3, in no acute distress. Pt clinically well appearing and without any conversation dyspnea or increased work of breathing.  HEENT: [place]/AT PEERL, EOMI, anicteric Neck: Trachea midline,  no masses, no thyromegal,y  no JVD, no carotid bruit OROPHARYNX:  Moist, No exudate/ erythema/lesions.  Heart: Regular rate and rhythm, without murmurs, rubs, gallops.  Lungs: Clear to auscultation, no wheezing or rhonchi noted.  Abdomen: Soft, nontender, nondistended, positive bowel sounds, no masses no hepatosplenomegaly noted.  Neuro: No focal neurological deficits noted cranial nerves II through XII grossly intact.  Strength normal in bilateral upper and lower extremities. Musculoskeletal: No warm swelling or erythema around joints, no spinal tenderness noted. Psychiatric: Patient alert and oriented x3, good insight and cognition, good recent to remote recall. Lymph node survey: No cervical axillary or inguinal lymphadenopathy noted.   Data Reviewed: Basic Metabolic Panel:  Recent Labs Lab 01/01/14 1831 01/02/14 0620 01/03/14 0408  NA 138 139 141  K 3.6* 3.8 4.2  CL 103 104 107  CO2 22 21 23   GLUCOSE 116* 91 114*  BUN 14 16 26*  CREATININE 0.76 1.77* 1.41*  CALCIUM 9.3 8.6 8.4  MG  --  1.6  --    Liver Function Tests:  Recent Labs Lab 01/01/14 1831 01/02/14 0620 01/03/14 0408  AST 48* 76* 44*  ALT 27 45 39  ALKPHOS 98 101 105  BILITOT 1.5* 2.0* 2.8*  PROT 7.5 6.4 6.4  ALBUMIN 3.8 3.2* 3.2*   No results found for this basename: LIPASE, AMYLASE,  in the last 168 hours No results found for this basename: AMMONIA,  in the last 168 hours CBC:  Recent Labs Lab 01/01/14 1831 01/02/14 0620 01/03/14 0408  WBC 9.6 26.3* 16.5*  NEUTROABS 6.6 24.2* 11.9*  HGB 8.2* 7.5* 6.6*  HCT 24.5* 22.2* 19.1*  MCV 95.7 96.5 94.6  PLT 424* PLATELET CLUMPS NOTED ON SMEAR, COUNT APPEARS ADEQUATE 281   Cardiac Enzymes: No results found for this basename: CKTOTAL, CKMB, CKMBINDEX, TROPONINI,  in the last 168 hours BNP (last 3 results)  Recent Labs  10/20/13 1935 10/22/13 0300  PROBNP 471.8* 160.5*   CBG: No results found for this basename: GLUCAP,  in the last 168 hours  Recent Results (from the  past 240 hour(s))  URINE CULTURE     Status: None   Collection Time    01/02/14  1:43 AM      Result Value Ref Range Status   Specimen Description URINE, CLEAN CATCH   Final   Special Requests Normal   Final   Culture  Setup Time     Final   Value: 01/02/2014 08:38     Performed at [facility]     Final   Value: NO GROWTH     Performed at [facility]     Final   Value: NO GROWTH     Performed at [facility]   Report Status 01/03/2014 FINAL   Final  CULTURE, BLOOD (ROUTINE X 2)     Status: None   Collection Time    01/02/14  6:10 AM      Result Value Ref Range Status   Specimen Description BLOOD LEFT HAND   Final   Special Requests BOTTLES DRAWN AEROBIC ONLY 2CC   Final   Culture  Setup Time     Final   Value: 01/02/2014 08:36     Performed at [facility]   Culture     Final   Value:        BLOOD CULTURE RECEIVED NO GROWTH TO DATE CULTURE WILL BE HELD FOR 5 DAYS BEFORE ISSUING A FINAL NEGATIVE REPORT     Performed at [facility]   Report Status PENDING   Incomplete  CULTURE, BLOOD (ROUTINE X 2)     Status: None   Collection Time    01/02/14  6:20 AM      Result Value Ref Range Status   Specimen Description BLOOD LEFT HAND   Final   Special Requests BOTTLES DRAWN AEROBIC ONLY 2CC   Final   Culture  Setup Time     Final   Value: 01/02/2014 08:36     Performed at [facility]   Culture     Final   Value:        BLOOD CULTURE RECEIVED NO GROWTH TO DATE CULTURE WILL BE HELD FOR 5 DAYS BEFORE ISSUING A FINAL NEGATIVE REPORT     Performed at [facility]   Report Status PENDING   Incomplete     Studies: Dg Chest 2 View  (if Recent History Of Cough Or Chest Pain)  01/01/2014   CLINICAL DATA:  Chest tightness, cough and fever  EXAM: CHEST  2 VIEW  COMPARISON:  Prior chest x-ray 11/06/2013  FINDINGS: Stable cardiac and mediastinal contours. Very low inspiratory volumes. Similar appearance of mild  diffuse interstitial prominence. The central bronchial wall thickening similar compared to prior. No focal airspace consolidation. No pleural effusion or pneumothorax. Surgical changes of prior left shoulder arthroplasty. No acute osseous abnormality.  IMPRESSION: Stable chest x-ray without evidence of acute cardiopulmonary disease.   Electronically Signed   By: Jacqulynn Cadet M.D.   On: 01/01/2014 18:37    Scheduled Meds: . amitriptyline  25 mg Oral QHS  . folic acid  1 mg Oral q morning - 10a  . heparin  5,000 Units Subcutaneous 3 times per day  . HYDROmorphone PCA 0.3 mg/mL   Intravenous 6 times per day  . hydroxyurea  2,500 mg Oral Daily  . ketorolac  30 mg Intravenous 4 times per day  . morphine  60 mg Oral 3 times per day  . multivitamin with minerals  1 tablet Oral Daily  . pantoprazole  40 mg Oral Daily  . senna-docusate  1 tablet Oral BID  . sodium chloride  10-40 mL Intracatheter Q12H   Continuous Infusions: . dextrose 5 % and 0.45% NaCl 75 mL/hr at 01/02/14 2134    Total time spent 56 minutes

## 2014-01-03 NOTE — Progress Notes (Signed)
Called by RN with critical lab value hgb 6.6.  Per RN patient is asymptomatic.  After reading Dr. Zigmund Daniel' note I will not order a blood transfusion, and defer this decision to the MD on in 2 hours.  Imogene Burn, PA-C [facility] Pager: [phone]

## 2014-01-04 DIAGNOSIS — D5700 Hb-SS disease with crisis, unspecified: Principal | ICD-10-CM

## 2014-01-04 DIAGNOSIS — D571 Sickle-cell disease without crisis: Secondary | ICD-10-CM

## 2014-01-04 LAB — CBC WITH DIFFERENTIAL/PLATELET
BASOS PCT: 0 % (ref 0–1)
Basophils Absolute: 0 10*3/uL (ref 0.0–0.1)
Eosinophils Absolute: 0.4 10*3/uL (ref 0.0–0.7)
Eosinophils Relative: 5 % (ref 0–5)
HCT: 17.3 % — ABNORMAL LOW (ref 39.0–52.0)
Hemoglobin: 5.9 g/dL — CL (ref 13.0–17.0)
Lymphocytes Relative: 43 % (ref 12–46)
Lymphs Abs: 3.1 10*3/uL (ref 0.7–4.0)
MCH: 32.2 pg (ref 26.0–34.0)
MCHC: 34.1 g/dL (ref 30.0–36.0)
MCV: 94.5 fL (ref 78.0–100.0)
Monocytes Absolute: 0.3 10*3/uL (ref 0.1–1.0)
Monocytes Relative: 4 % (ref 3–12)
NEUTROS ABS: 3.5 10*3/uL (ref 1.7–7.7)
NEUTROS PCT: 49 % (ref 43–77)
Platelets: 268 10*3/uL (ref 150–400)
RBC: 1.83 MIL/uL — ABNORMAL LOW (ref 4.22–5.81)
RDW: 16.6 % — ABNORMAL HIGH (ref 11.5–15.5)
WBC: 7.2 10*3/uL (ref 4.0–10.5)

## 2014-01-04 LAB — LACTATE DEHYDROGENASE: LDH: 294 U/L — ABNORMAL HIGH (ref 94–250)

## 2014-01-04 MED ORDER — HYDROMORPHONE 0.3 MG/ML IV SOLN
INTRAVENOUS | Status: DC
  Administered 2014-01-04: 9.58 mg via INTRAVENOUS
  Administered 2014-01-04 – 2014-01-05 (×2): via INTRAVENOUS
  Administered 2014-01-05: 3.6 mg via INTRAVENOUS
  Administered 2014-01-05: 6 mg via INTRAVENOUS
  Administered 2014-01-05 (×2): via INTRAVENOUS
  Administered 2014-01-05: 3.92 mg via INTRAVENOUS
  Administered 2014-01-05: 10:00:00 via INTRAVENOUS
  Administered 2014-01-05: 6.4 mg via INTRAVENOUS
  Administered 2014-01-05: 6.95 mg via INTRAVENOUS
  Administered 2014-01-05: 05:00:00 via INTRAVENOUS
  Administered 2014-01-05: 9.42 mg via INTRAVENOUS
  Administered 2014-01-05 – 2014-01-06 (×4): via INTRAVENOUS
  Administered 2014-01-06: 7.2 mg via INTRAVENOUS
  Administered 2014-01-06: 1.2 mg via INTRAVENOUS
  Administered 2014-01-06: 4.8 mg via INTRAVENOUS
  Administered 2014-01-06: 8.72 mg via INTRAVENOUS
  Administered 2014-01-06: 5.12 mg via INTRAVENOUS
  Administered 2014-01-06: 05:00:00 via INTRAVENOUS
  Administered 2014-01-06: 4.6 mg via INTRAVENOUS
  Administered 2014-01-06: 3.49 mg via INTRAVENOUS
  Administered 2014-01-07: 10:00:00 via INTRAVENOUS
  Administered 2014-01-07: 4.22 mg via INTRAVENOUS
  Administered 2014-01-07: 03:00:00 via INTRAVENOUS
  Administered 2014-01-07: 1.6 mg via INTRAVENOUS
  Administered 2014-01-07: 06:00:00 via INTRAVENOUS
  Administered 2014-01-07: 6.6 mg via INTRAVENOUS
  Filled 2014-01-04 (×14): qty 25

## 2014-01-04 MED ORDER — METHYLPREDNISOLONE SODIUM SUCC 125 MG IJ SOLR
60.0000 mg | Freq: Two times a day (BID) | INTRAMUSCULAR | Status: DC
  Administered 2014-01-04 – 2014-01-05 (×2): 60 mg via INTRAVENOUS
  Filled 2014-01-04 (×4): qty 0.96

## 2014-01-04 NOTE — Progress Notes (Signed)
When NT went to get 2 am vitals patient found asleep lying cross ways in the bed with feet and head both hanging off the bed. PCA monitor on and sats in mid 90's on room air. Went in room with NT. Found an empty bottle of prescription dilaudid, 1 syringe with 4 ml of liquid still in it, and a pair of hospital gloves barely under with edge of the sheets beside the patient's left side. Bottle had script on it for dilaudid 4 mg tabs (2mg  tabs  q4h prn). It was dispensed on 12/26/13 with total count given of 84 pills. Bottle was empty. Patient's name called and patted on arm. Patient sat up hitting top of head on edge of bedside table. Eyes "wide Open". NT took vitals. Pulse elevated and BP higher than normal. Also clamp open on capped line of PICC-purple port.  Asked patient about the syringes and he said that they were left in the room by "the nurses". Patient claims empty pill bottle he had when he came in to the hospital and it was empty then as well. Patient stated that he did not know how clamp came open. Told patient that we are concerned about his safety and he does not need to be "messing with his IV line or with 'empty' syringes". Just had PAC taken out due to infection. Patient acknowledged he heard my concerns. Patient stills denies any wrong doing.

## 2014-01-04 NOTE — Progress Notes (Signed)
Subjective: A 28 yo man with history of sickle cell disease admitted with sickle cell painful crisis. Patient was recently at [facility] and admitted for 2 weeks. He has been on dilaudid PCA here and has been using about 20 mg of Dilaudid in 24 hours. He was however found with empty pill bottle and empty syringe today. He has been found with this before and has had conversation about his safety and possible risk of overdose. Patient denies that he was doing something inappropriate. He describes his pain today at 8/10 and no SOB, no cough, no fever. His hemoglobin has dropped but has known history of delayed transfusion reaction and has received recent blood transfusion.  Objective: Vital signs in last 24 hours: Temp:  [97.3 F (36.3 C)-99.1 F (37.3 C)] 99.1 F (37.3 C) (04/23 1436) Pulse Rate:  [96-122] 111 (04/23 1436) Resp:  [14-22] 18 (04/23 1600) BP: (107-132)/(62-89) 114/71 mmHg (04/23 1436) SpO2:  [92 %-100 %] 96 % (04/23 1600) FiO2 (%):  [21 %] 21 % (04/23 0658) Weight:  [84.5 kg (186 lb 4.6 oz)] 84.5 kg (186 lb 4.6 oz) (04/23 0600) Weight change: 1.5 kg (3 lb 4.9 oz) Last BM Date: 01/01/14  Intake/Output from previous day: 04/22 0701 - 04/23 0700 In: 3209.4 [P.O.:1200; I.V.:2009.4] Out: 2175 [Urine:2175] Intake/Output this shift: Total I/O In: 687.2 [I.V.:687.2] Out: 1925 [POEUM:3536]  General appearance: alert, cooperative and no distress Eyes: conjunctivae/corneas clear. PERRL, EOM's intact. Fundi benign. Neck: no adenopathy, no carotid bruit, no JVD, supple, symmetrical, trachea midline and thyroid not enlarged, symmetric, no tenderness/mass/nodules Back: symmetric, no curvature. ROM normal. No CVA tenderness. Resp: clear to auscultation bilaterally Chest wall: no tenderness Cardio: regular rate and rhythm, S1, S2 normal, no murmur, click, rub or gallop GI: soft, non-tender; bowel sounds normal; no masses,  no organomegaly Extremities: extremities normal, atraumatic,  no cyanosis or edema Pulses: 2+ and symmetric Skin: Skin color, texture, turgor normal. No rashes or lesions Neurologic: Grossly normal  Lab Results:  Recent Labs  01/03/14 0408 01/04/14 0540  WBC 16.5* 7.2  HGB 6.6* 5.9*  HCT 19.1* 17.3*  PLT 281 268   BMET  Recent Labs  01/02/14 0620 01/03/14 0408  NA 139 141  K 3.8 4.2  CL 104 107  CO2 21 23  GLUCOSE 91 114*  BUN 16 26*  CREATININE 1.77* 1.41*  CALCIUM 8.6 8.4    Studies/Results: No results found.  Medications: I have reviewed the patient's current medications.  Assessment/Plan: A 28 yo man with Sickle cell painful crisis and possible delayed transfusion reaction.  #1 Sickle Cell Painful crisis: patient on Dilaudid PCA pump. Also toradol and other medications. I will decrease his bolus dose from 0.8 to 0.6 mg and continue other medications including his oral medications.  #2 Anemia: probably due to delayed transfusion reaction and some hemolysis from that. Will monitor today and start Steroids.  #3 Delayed Transfusion reaction: Continue steroids and avoid transfusion unless absolutely necessary. No daily blood draws also if possible.  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 Worrisome that he may actually harm himself one of these days.   LOS: 3 days   Elwyn Reach 01/04/2014, 4:25 PM

## 2014-01-04 NOTE — Progress Notes (Signed)
Order carried out for Dilaudid PCA dose adjustment.Sandie Ano RN

## 2014-01-05 DIAGNOSIS — D5700 Hb-SS disease with crisis, unspecified: Secondary | ICD-10-CM

## 2014-01-05 LAB — CBC WITH DIFFERENTIAL/PLATELET
BASOS PCT: 0 % (ref 0–1)
Basophils Absolute: 0 10*3/uL (ref 0.0–0.1)
EOS PCT: 0 % (ref 0–5)
Eosinophils Absolute: 0 10*3/uL (ref 0.0–0.7)
HCT: 17.3 % — ABNORMAL LOW (ref 39.0–52.0)
Hemoglobin: 5.8 g/dL — CL (ref 13.0–17.0)
Lymphocytes Relative: 11 % — ABNORMAL LOW (ref 12–46)
Lymphs Abs: 1 10*3/uL (ref 0.7–4.0)
MCH: 31.9 pg (ref 26.0–34.0)
MCHC: 33.5 g/dL (ref 30.0–36.0)
MCV: 95.1 fL (ref 78.0–100.0)
Monocytes Absolute: 0.1 10*3/uL (ref 0.1–1.0)
Monocytes Relative: 1 % — ABNORMAL LOW (ref 3–12)
NEUTROS PCT: 88 % — AB (ref 43–77)
Neutro Abs: 8.1 10*3/uL — ABNORMAL HIGH (ref 1.7–7.7)
PLATELETS: 341 10*3/uL (ref 150–400)
RBC: 1.82 MIL/uL — ABNORMAL LOW (ref 4.22–5.81)
RDW: 17 % — AB (ref 11.5–15.5)
WBC: 9.2 10*3/uL (ref 4.0–10.5)

## 2014-01-05 LAB — COMPREHENSIVE METABOLIC PANEL
ALT: 24 U/L (ref 0–53)
AST: 22 U/L (ref 0–37)
Albumin: 3.3 g/dL — ABNORMAL LOW (ref 3.5–5.2)
Alkaline Phosphatase: 92 U/L (ref 39–117)
BUN: 13 mg/dL (ref 6–23)
CO2: 23 mEq/L (ref 19–32)
Calcium: 8.9 mg/dL (ref 8.4–10.5)
Chloride: 104 mEq/L (ref 96–112)
Creatinine, Ser: 0.79 mg/dL (ref 0.50–1.35)
GFR calc Af Amer: >90 mL/min (ref >90)
GFR calc non Af Amer: >90 mL/min (ref >90)
GLUCOSE: 143 mg/dL — AB (ref 70–99)
POTASSIUM: 4.9 meq/L (ref 3.7–5.3)
SODIUM: 136 meq/L — AB (ref 137–147)
Total Bilirubin: 1.3 mg/dL — ABNORMAL HIGH (ref 0.3–1.2)
Total Protein: 6.8 g/dL (ref 6.0–8.3)

## 2014-01-05 MED ORDER — PREDNISONE 20 MG PO TABS
40.0000 mg | ORAL_TABLET | Freq: Two times a day (BID) | ORAL | Status: DC
  Administered 2014-01-05 – 2014-01-09 (×8): 40 mg via ORAL
  Filled 2014-01-05 (×10): qty 2

## 2014-01-05 MED ORDER — DEXTROSE 5 % IV SOLN
1.0000 g | Freq: Three times a day (TID) | INTRAVENOUS | Status: DC
  Administered 2014-01-05 – 2014-01-09 (×11): 1 g via INTRAVENOUS
  Filled 2014-01-05 (×13): qty 1

## 2014-01-05 NOTE — Progress Notes (Signed)
Clinical Social Work Department CLINICAL SOCIAL WORK PSYCHIATRY SERVICE LINE ASSESSMENT 01/05/2014  Patient:  Dennis Bernard  Account:  [id number]  [place] Date:  01/01/2014  Clinical Social Worker:  Sindy Messing, LCSW  Date/Time:  01/05/2014 11:30 AM Referred by:  Physician  Date referred:  01/05/2014 Reason for Referral  Psychosocial assessment   Presenting Symptoms/Problems (In the person's/family's own words):   Psych consulted due to capacity and because patient was found with empty pill bottles and syringes in his room.   Abuse/Neglect/Trauma History (check all that apply)  Denies history   Abuse/Neglect/Trauma Comments:   Psychiatric History (check all that apply)  Denies history   Psychiatric medications:  Elavil 25 mg   Current Mental Health Hospitalizations/Previous Mental Health History:   Patient reports no formal MH diagnosis but reports he feels anxious at times. Patient denies any depression and is unaware that he was taking Elavil. Patient reports no current outpatient follow up.   Current provider:   PCP   Place and Date:   [place], [place]   Current Medications:   Scheduled Meds:      . amitriptyline  25 mg Oral QHS  . folic acid  1 mg Oral q morning - 10a  . heparin  5,000 Units Subcutaneous 3 times per day  . HYDROmorphone PCA 0.3 mg/mL   Intravenous 6 times per day  . ketorolac  30 mg Intravenous 4 times per day  . morphine  60 mg Oral 3 times per day  . multivitamin with minerals  1 tablet Oral Daily  . pantoprazole  40 mg Oral Daily  . predniSONE  40 mg Oral BID WC  . senna-docusate  1 tablet Oral BID  . sodium chloride  10-40 mL Intracatheter Q12H        Continuous Infusions:      . dextrose 5 % and 0.45% NaCl 1,000 mL (01/05/14 0514)          PRN Meds:.acetaminophen, acetaminophen, albuterol, alum & mag hydroxide-simeth, diphenhydrAMINE, HYDROcodone-acetaminophen, LORazepam, LORazepam, naloxone, ondansetron (ZOFRAN) IV, polyethylene glycol,  promethazine, promethazine, sodium chloride, sodium chloride, sorbitol       Previous Impatient Admission/Date/Reason:   None reported   Emotional Health / Current Symptoms    Suicide/Self Harm  None reported   Suicide attempt in the past:   Patient denies any previous attempts. Patient denies any SI or HI.   Other harmful behavior:   None reported   Psychotic/Dissociative Symptoms  None reported   Other Psychotic/Dissociative Symptoms:   N/A    Attention/Behavioral Symptoms  Within Normal Limits   Other Attention / Behavioral Symptoms:   Patient engaged during assessment.    Cognitive Impairment  Within Normal Limits   Other Cognitive Impairment:   Patient alert and oriented.    Mood and Adjustment  Mood Congruent    Stress, Anxiety, Trauma, Any Recent Loss/Stressor  Anxiety   Anxiety (frequency):   Patient reports that anxiety increases especially during hospital stays. Patient reports that he is not taking any medication and has never received any therapy in order to develop coping skills to manage symptoms. Patient is open to referrals in the community.   Phobia (specify):   N/A   Compulsive behavior (specify):   N/A   Obsessive behavior (specify):   N/A   Other:   N/A   Substance Abuse/Use  None   SBIRT completed (please refer for detailed history):  N  Self-reported substance use:   Patient denies any substance use. Patient reports  he takes all medications as prescribed and denies any alcohol use, illegal or prescription drug use.   Urinary Drug Screen Completed:  N Alcohol level:   N/A    Environmental/Housing/Living Arrangement  Stable housing   Who is in the home:   Mom   Emergency contact:  Juliet-mom   Financial  Medicare  Medicaid   Patient's Strengths and Goals (patient's own words):   Patient reports that mother is supportive and assists as needed. Patient agreeable to outpatient resources to manage MH needs.    Clinical Social Worker's Interpretive Summary:   CSW received referral to complete psychosocial assessment. CSW reviewed chart and spoke with bedside RN prior to meeting with patient. CSW introduced myself and explained role.    Patient reports that he was admitted due to sickle cell problems and has had frequent admissions. Patient states that he currently lives at home with mother and that she is very supportive. Patient is not married and does not have any children. Patient has limited support through friends because he does not like sharing about his sickle cell.    Patient reports that he has never been formally diagnosed with any MH conditions but feels that his anxiety is increasing. Patient reports that he has spoken to his PCP but has not been prescribed any medications. Patient reports he is interested in meeting with psychiatrist to discuss medication options. Patient was unaware of any agencies that provided [place] treatment so CSW provided patient with local resources.    Patient is also involved with a sickle cell program in the area. CSW and patient discussed that when patient is anxious he tends to isolate and feels lonely. Patient reports he still does not feel comfortable disclosing his sickle cell to friends. CSW encouraged patient to attend a sickle cell support group so that he could find others that relate to his concerns. CSW and patient spoke about managing anxiety so that his isolation does not continue and cause additional problems such as feeling depressed.    CSW and patient discussed coping skills for anxiety such as meditation, deep breathing, or journaling. CSW encouraged patient to identify triggers and track successful coping skills. Patient denies that he uses alcohol or drugs to cope with skills.    CSW provided outpatient resources and will continue to follow.   Disposition:  Outpatient referral made/needed   [place], [place] [phone]

## 2014-01-05 NOTE — Progress Notes (Signed)
ANTIBIOTIC CONSULT NOTE - INITIAL  Pharmacy Consult for Cefepme Indication: 1 of 2 blood cxs growing GNRs  No Known Allergies  Patient Measurements: Height: 5\' 9"  (175.3 cm) Weight: 186 lb 4.6 oz (84.5 kg) IBW/kg (Calculated) : 70.7 Adjusted Body Weight:  Vital Signs: Temp: 98.6 F (37 C) (04/24 1833) Temp src: Oral (04/24 1833) BP: 119/79 mmHg (04/24 1833) Pulse Rate: 78 (04/24 1833) Intake/Output from previous day: 04/23 0701 - 04/24 0700 In: 3884.6 [P.O.:1920; I.V.:1964.6] Out: 3600 [Urine:3600] Intake/Output from this shift:    Labs:  Recent Labs  01/03/14 0408 01/04/14 0540 01/05/14 0436  WBC 16.5* 7.2 9.2  HGB 6.6* 5.9* 5.8*  PLT 281 268 341  CREATININE 1.41*  --  0.79   Estimated Creatinine Clearance: 136.2 ml/min (by C-G formula based on Cr of 0.79). No results found for this basename: VANCOTROUGH, Corlis Leak, VANCORANDOM, [place], [place], GENTRANDOM, [place], TOBRAPEAK, TOBRARND, AMIKACINPEAK, AMIKACINTROU, AMIKACIN,  in the last 72 hours   Microbiology: Recent Results (from the past 720 hour(s))  URINE CULTURE     Status: None   Collection Time    01/02/14  1:43 AM      Result Value Ref Range Status   Specimen Description URINE, CLEAN CATCH   Final   Special Requests Normal   Final   Culture  Setup Time     Final   Value: 01/02/2014 08:38     Performed at [place]     Final   Value: NO GROWTH     Performed at [facility]   Culture     Final   Value: NO GROWTH     Performed at [facility]   Report Status 01/03/2014 FINAL   Final  CULTURE, BLOOD (ROUTINE X 2)     Status: None   Collection Time    01/02/14  6:10 AM      Result Value Ref Range Status   Specimen Description BLOOD LEFT HAND   Final   Special Requests BOTTLES DRAWN AEROBIC ONLY 2CC   Final   Culture  Setup Time     Final   Value: 01/02/2014 08:36     Performed at [facility]   Culture     Final   Value: GRAM NEGATIVE  RODS     Note: Gram Stain Report Called to,Read Back By and Verified With: SARA ON 4.24.15 @ 5:30pM BY [place]     Performed at [facility]   Report Status PENDING   Incomplete  CULTURE, BLOOD (ROUTINE X 2)     Status: None   Collection Time    01/02/14  6:20 AM      Result Value Ref Range Status   Specimen Description BLOOD LEFT HAND   Final   Special Requests BOTTLES DRAWN AEROBIC ONLY 2CC   Final   Culture  Setup Time     Final   Value: 01/02/2014 08:36     Performed at [facility]   Culture     Final   Value:        BLOOD CULTURE RECEIVED NO GROWTH TO DATE CULTURE WILL BE HELD FOR 5 DAYS BEFORE ISSUING A FINAL NEGATIVE REPORT     Performed at [facility]   Report Status PENDING   Incomplete    Medical History: Past Medical History  Diagnosis Date  . Sickle cell disease   . Avascular necrosis of femur head, right   . H/O allergic  rhinitis   . H/O hypokalemia   . Chronic pain syndrome   . H/O wheezing     with colds  . 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  Pharmacy consulted to dose cefepime.  No allergies noted.    4/24 >> Cefepime  >>  Tmax: 99.1 WBCs: 9.2K Renal: SCr 0.79, CrCl >100 (CG and N)  4/21 blood: 1/2 GNRs 4/21 urine: NG   Goal of Therapy:  Eradication of infection  Plan:  Cefepime 1g IV q8h, f/u microbiology, clinical course  Ralene Bathe, PharmD, BCPS 01/05/2014, 8:58 PM  Pager: [phone]

## 2014-01-05 NOTE — Plan of Care (Signed)
Problem: Phase I Progression Outcomes Goal: Pain controlled with appropriate interventions Outcome: Progressing Patient still reporting pain 8/10 in low back and lu arm. On PCA and scheduled medication

## 2014-01-05 NOTE — Progress Notes (Signed)
Subjective: Patient is still having 7 out of 10 pain. He is on Dilaudid PCA and has used a 2 mg in the last 24 hours with 44 demands and 40 deliveries. No nausea vomiting or diarrhea. No shortness of breath or cough. His hemoglobin has dropped to 5.8 but not far from where he was yesterday.  Objective: Vital signs in last 24 hours: Temp:  [97.5 F (36.4 C)-99.1 F (37.3 C)] 98.7 F (37.1 C) (04/24 0949) Pulse Rate:  [73-111] 73 (04/24 0949) Resp:  [14-21] 16 (04/24 0949) BP: (106-122)/(69-83) 122/83 mmHg (04/24 0949) SpO2:  [90 %-100 %] 99 % (04/24 0949) FiO2 (%):  [21 %] 21 % (04/24 0440) Weight change:  Last BM Date: 01/04/14  Intake/Output from previous day: 04/23 0701 - 04/24 0700 In: 3884.6 [P.O.:1920; I.V.:1964.6] Out: 3600 [Urine:3600] Intake/Output this shift: Total I/O In: 120 [P.O.:120] Out: 825 [Urine:825]  General appearance: alert, cooperative and no distress Eyes: conjunctivae/corneas clear. PERRL, EOM's intact. Fundi benign. Neck: no adenopathy, no carotid bruit, no JVD, supple, symmetrical, trachea midline and thyroid not enlarged, symmetric, no tenderness/mass/nodules Back: symmetric, no curvature. ROM normal. No CVA tenderness. Resp: clear to auscultation bilaterally Chest wall: no tenderness Cardio: regular rate and rhythm, S1, S2 normal, no murmur, click, rub or gallop GI: soft, non-tender; bowel sounds normal; no masses,  no organomegaly Extremities: extremities normal, atraumatic, no cyanosis or edema Pulses: 2+ and symmetric Skin: Skin color, texture, turgor normal. No rashes or lesions Neurologic: Grossly normal  Lab Results:  Recent Labs  01/04/14 0540 01/05/14 0436  WBC 7.2 9.2  HGB 5.9* 5.8*  HCT 17.3* 17.3*  PLT 268 341   BMET  Recent Labs  01/03/14 0408 01/05/14 0436  NA 141 136*  K 4.2 4.9  CL 107 104  CO2 23 23  GLUCOSE 114* 143*  BUN 26* 13  CREATININE 1.41* 0.79  CALCIUM 8.4 8.9    Studies/Results: No results  found.  Medications: I have reviewed the patient's current medications.  Assessment/Plan: A 28 yo man with Sickle cell painful crisis and possible delayed transfusion reaction.  #1 Sickle Cell Painful crisis: patient on Dilaudid PCA pump. We will keep him on current dose through today and tomorrow. They will begin to titrate her down again  #2 Anemia: From delayed transfusion reaction. We'll continue with steroids. His hemoglobin is still is coughing a little but this may be due to blood draws and IV fluids. I will DC all IV fluids. And monitor him closely. If interested in further to 5 although cannot consider another transfusion with pretreatment  #3 Delayed Transfusion reaction: As indicated above we will treat this with steroids. Consider hematology consult also is not getting any better.  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 Recheck.   #6 Ethics: Psychiatry has been consulted. We'll await the input of the psychiatric.  LOS: 4 days   Elwyn Reach 01/05/2014, 11:12 AM

## 2014-01-05 NOTE — Care Management Note (Signed)
CARE MANAGEMENT NOTE 01/05/2014  Patient:  Dennis Bernard, Dennis Bernard   Account Number:  [id number]  Date Initiated:  01/05/2014  Documentation initiated by:  Veronika Heard  Subjective/Objective Assessment:   28 yo male admitted with sickle cell crisis.PCP: Wilhelmina Mcardle, MD     Action/Plan:   Home when stable   Anticipated DC Date:     Anticipated DC Plan:  [place]  CM consult      Choice offered to / List presented to:  NA   DME arranged  NA      DME agency  NA     Jane [place] arranged  NA      Status of service:  Completed, signed off Medicare Important Message given?   (If response is "NO", the following Medicare IM given date fields will be blank) Date Medicare IM given:   Date Additional Medicare IM given:    Discharge Disposition:    Per UR Regulation:  Reviewed for med. necessity/level of care/duration of stay  If discussed at [place] of Stay Meetings, dates discussed:    Comments:  01/05/14 [place] Chart reviewed for utilization of services.

## 2014-01-06 DIAGNOSIS — F411 Generalized anxiety disorder: Secondary | ICD-10-CM

## 2014-01-06 LAB — CBC WITH DIFFERENTIAL/PLATELET
BASOS PCT: 0 % (ref 0–1)
Basophils Absolute: 0 10*3/uL (ref 0.0–0.1)
Eosinophils Absolute: 0 10*3/uL (ref 0.0–0.7)
Eosinophils Relative: 0 % (ref 0–5)
HEMATOCRIT: 16.9 % — AB (ref 39.0–52.0)
Hemoglobin: 5.6 g/dL — CL (ref 13.0–17.0)
LYMPHS PCT: 23 % (ref 12–46)
Lymphs Abs: 2.9 10*3/uL (ref 0.7–4.0)
MCH: 32.6 pg (ref 26.0–34.0)
MCHC: 33.1 g/dL (ref 30.0–36.0)
MCV: 98.3 fL (ref 78.0–100.0)
MONOS PCT: 3 % (ref 3–12)
Monocytes Absolute: 0.4 10*3/uL (ref 0.1–1.0)
NEUTROS ABS: 9.4 10*3/uL — AB (ref 1.7–7.7)
Neutrophils Relative %: 74 % (ref 43–77)
Platelets: 414 10*3/uL — ABNORMAL HIGH (ref 150–400)
RBC: 1.72 MIL/uL — ABNORMAL LOW (ref 4.22–5.81)
RDW: 17.5 % — ABNORMAL HIGH (ref 11.5–15.5)
WBC: 12.7 10*3/uL — AB (ref 4.0–10.5)

## 2014-01-06 NOTE — Progress Notes (Signed)
Subjective: Patient is still having 7-8 out of 10 pain. He is on Dilaudid PCA and has used 29.75mg  in the last 24 hours with 56 demands and 51 deliveries. No nausea vomiting or diarrhea. No shortness of breath or cough. His hemoglobin has dropped to 5.6 but not far from where he was yesterday. Has one out of 2 blood cultures growing gram-negative rods. Identification is still pending to  Objective: Vital signs in last 24 hours: Temp:  [97.5 F (36.4 C)-98.6 F (37 C)] 97.9 F (36.6 C) (04/25 1004) Pulse Rate:  [65-99] 65 (04/25 1004) Resp:  [12-19] 16 (04/25 1004) BP: (104-119)/(68-83) 117/78 mmHg (04/25 1004) SpO2:  [91 %-100 %] 100 % (04/25 1004) FiO2 (%):  [21 %-28 %] 28 % (04/25 0836) Weight change:  Last BM Date: 01/06/14  Intake/Output from previous day: 04/24 0701 - 04/25 0700 In: 3154.3 [P.O.:960; I.V.:2094.3; IV Piggyback:100] Out: 2600 [Urine:2600] Intake/Output this shift: Total I/O In: 10 [I.V.:10] Out: 800 [Urine:800]  General appearance: alert, cooperative and no distress Eyes: conjunctivae/corneas clear. PERRL, EOM's intact. Fundi benign. Neck: no adenopathy, no carotid bruit, no JVD, supple, symmetrical, trachea midline and thyroid not enlarged, symmetric, no tenderness/mass/nodules Back: symmetric, no curvature. ROM normal. No CVA tenderness. Resp: clear to auscultation bilaterally Chest wall: no tenderness Cardio: regular rate and rhythm, S1, S2 normal, no murmur, click, rub or gallop GI: soft, non-tender; bowel sounds normal; no masses,  no organomegaly Extremities: extremities normal, atraumatic, no cyanosis or edema Pulses: 2+ and symmetric Skin: Skin color, texture, turgor normal. No rashes or lesions Neurologic: Grossly normal  Lab Results:  Recent Labs  01/05/14 0436 01/06/14 0425  WBC 9.2 12.7*  HGB 5.8* 5.6*  HCT 17.3* 16.9*  PLT 341 414*   BMET  Recent Labs  01/05/14 0436  NA 136*  K 4.9  CL 104  CO2 23  GLUCOSE 143*  BUN 13   CREATININE 0.79  CALCIUM 8.9    Studies/Results: No results found.  Medications: I have reviewed the patient's current medications.  Assessment/Plan: A 28 yo man with Sickle cell painful crisis and possible delayed transfusion reaction.  #1 Sickle Cell Painful crisis: We will continue his Dilaudid PCA and oral pain medications.  #2 Anemia: From delayed transfusion reaction. Continue oral steroids. If and when indicated we will transfuse PRBC but not at this point.  #3 Delayed Transfusion reaction: As indicated above we will treat this with steroids. Consider hematology consult also is not getting any better.  #4 gram-negative rod bacteremia: Patient currently started on antibiotics. We will await sensitivities and identification of the bacteria to see what it is and what to do next. Is not having any fever or chills. Gram-negative rods however not common contaminants so we'll assume it this is for infections. Once we have identification and sensitivities we will decide on long-term treatment for at least total of 2 weeks if confirmed.   #5 Chronic Pain Syndrome: Continue home medications  #6 AKI on CKDII: Renal function getting better. Recheck in the morning..   #7 Ethics: Psychiatry has been consulted. Outpatient followup recommended    LOS: 5 days   Elwyn Reach 01/06/2014, 12:56 PM

## 2014-01-06 NOTE — Consult Note (Signed)
[facility] Face-to-Face Psychiatry Consult   Reason for Consult:  Capacity Referring Physician:  Dr Myrna Blazer is an 28 y.o. male. Total Time spent with patient: 20 minutes  Assessment: AXIS I:  Anxiety Disorder NOS AXIS II:  Deferred AXIS III:   Past Medical History  Diagnosis Date  . Sickle cell disease   . Avascular necrosis of femur head, right   . H/O allergic rhinitis   . H/O hypokalemia   . Chronic pain syndrome   . H/O wheezing     with colds  . 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   Patient does not meet criteria for psychiatric inpatient admission. Supportive therapy provided about ongoing stressors. Discussed crisis plan, support from social network, calling 911, coming to the Emergency Department, and calling Suicide Hotline.  Subjective:   Dennis Bernard is a 28 y.o. male patient admitted with a sickle cell crisis.  HPI:  Patient seen chart reviewed.  Patient is 28 year old African American male who was presented to the emergency room because of sickle cell crisis.  He was admitted on the medical floor for the pain management.  Psych consult was called because there were empty pill bottles and empty syringe found in his room.  Patient told that he is getting IV treatment at home and sometimes they leave syringes in his room.  Patient also mentioned that he had forgot to throw his empty bottle.  Patient denies any depression, suicidal thoughts, hallucination or any paranoia.  He admitted anxiety symptoms but denies any recent worsening of depression or any hopeless feeling.  Patient lives with his mother.  Patient endorsed some time feeling very anxious because of his chronic health issues but denies any  previous psychiatric treatment.  The patient was relevant in the conversation.  He knows his illness very well.  He is compliant with the treatment.  There has been no aggression violence or any hallucination.  She denies any previous history of suicidal attempt.  Patient is seeing Dr. Rodman Key for his chronic health issues.  However the patient is interested in counseling to help his anxiety upon discharge.   Past Psychiatric History: Past Medical History  Diagnosis Date  . Sickle cell disease   . Avascular necrosis of femur head, right   . H/O allergic rhinitis   . H/O hypokalemia   . Chronic pain syndrome   . H/O wheezing     with colds  . 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 He has never used smokeless tobacco. He reports that he does not drink alcohol or use illicit drugs. Family History  Problem Relation Age of Onset  . Sickle cell anemia Brother   . Sickle cell trait Father   . Sickle cell trait Mother   . Diabetes Mellitus II Mother   . Sickle cell anemia Paternal Uncle   . Asthma Neg Hx   . Allergic rhinitis Neg Hx      Living Arrangements: Other relatives   Abuse/Neglect [facility]) Physical Abuse: Denies Verbal Abuse: Denies Sexual Abuse: Denies Allergies:  No Known Allergies  ACT Assessment Complete:  No:   Past Psychiatric History: Patient denies a previous history of psychiatric inpatient treatment. Place of Residence:  Lives with his mother. Marital Status:  Single Employed/Unemployed:  Unemployed Family Supports:  Yes Objective: Blood pressure 116/81, pulse 76, temperature 97.9 F (36.6 C), temperature source Oral, resp. rate 16, height 5' 9"  (1.753 m), weight 186 lb 4.6 oz (84.5 kg), SpO2 100.00%.Body mass index is 27.5 kg/(m^2). Results for orders placed during the hospital encounter of 01/01/14 (from the past 72 hour(s))  CBC WITH DIFFERENTIAL     Status: Abnormal   Collection Time    01/04/14  5:40 AM       Result Value Ref Range   WBC 7.2  4.0 - 10.5 K/uL   RBC 1.83 (*) 4.22 - 5.81 MIL/uL   Hemoglobin 5.9 (*) 13.0 - 17.0 g/dL   Comment: REPEATED TO VERIFY     CRITICAL VALUE NOTED.  VALUE IS CONSISTENT WITH PREVIOUSLY REPORTED AND CALLED VALUE.   HCT 17.3 (*) 39.0 - 52.0 %   MCV 94.5  78.0 - 100.0 fL   MCH 32.2  26.0 - 34.0 pg   MCHC 34.1  30.0 - 36.0 g/dL   RDW 16.6 (*) 11.5 - 15.5 %   Platelets 268  150 - 400 K/uL   Neutrophils Relative % 49  43 - 77 %   Neutro Abs 3.5  1.7 - 7.7 K/uL   Lymphocytes Relative 43  12 - 46 %   Lymphs Abs 3.1  0.7 - 4.0 K/uL   Monocytes Relative 4  3 - 12 %   Monocytes Absolute 0.3  0.1 - 1.0 K/uL   Eosinophils Relative 5  0 - 5 %   Eosinophils Absolute 0.4  0.0 - 0.7 K/uL   Basophils Relative 0  0 - 1 %   Basophils Absolute 0.0  0.0 - 0.1 K/uL  LACTATE DEHYDROGENASE     Status: Abnormal   Collection Time    01/04/14  5:40 AM      Result Value Ref Range   LDH 294 (*) 94 - 250 U/L  CBC WITH DIFFERENTIAL     Status: Abnormal   Collection Time    01/05/14  4:36 AM      Result Value Ref Range   WBC 9.2  4.0 - 10.5 K/uL   RBC 1.82 (*) 4.22 - 5.81 MIL/uL   Hemoglobin 5.8 (*) 13.0 - 17.0 g/dL   Comment: REPEATED TO VERIFY     CRITICAL VALUE NOTED.  VALUE IS CONSISTENT WITH PREVIOUSLY REPORTED AND CALLED VALUE.   HCT 17.3 (*) 39.0 - 52.0 %   MCV 95.1  78.0 - 100.0 fL   MCH 31.9  26.0 - 34.0 pg   MCHC 33.5  30.0 - 36.0 g/dL   RDW 17.0 (*) 11.5 - 15.5 %   Platelets 341  150 - 400 K/uL   Neutrophils Relative % 88 (*) 43 - 77 %   Neutro Abs 8.1 (*) 1.7 - 7.7 K/uL   Lymphocytes Relative 11 (*) 12 - 46 %   Lymphs Abs 1.0  0.7 - 4.0 K/uL   Monocytes Relative 1 (*) 3 - 12 %   Monocytes Absolute 0.1  0.1 - 1.0 K/uL   Eosinophils Relative 0  0 - 5 %   Eosinophils Absolute 0.0  0.0 - 0.7 K/uL   Basophils Relative 0  0 - 1 %   Basophils Absolute 0.0  0.0 - 0.1 K/uL  COMPREHENSIVE METABOLIC PANEL     Status: Abnormal   Collection Time    01/05/14  4:36 AM  Result Value Ref Range   Sodium 136 (*) 137 - 147 mEq/L   Potassium 4.9  3.7 - 5.3 mEq/L   Chloride 104  96 - 112 mEq/L   CO2 23  19 - 32 mEq/L   Glucose, Bld 143 (*) 70 - 99 mg/dL   BUN 13  6 - 23 mg/dL   Creatinine, Ser 0.79  0.50 - 1.35 mg/dL   Calcium 8.9  8.4 - 10.5 mg/dL   Total Protein 6.8  6.0 - 8.3 g/dL   Albumin 3.3 (*) 3.5 - 5.2 g/dL   AST 22  0 - 37 U/L   ALT 24  0 - 53 U/L   Alkaline Phosphatase 92  39 - 117 U/L   Total Bilirubin 1.3 (*) 0.3 - 1.2 mg/dL   GFR calc non Af Amer >90  >90 mL/min   GFR calc Af Amer >90  >90 mL/min   Comment: (NOTE)     The eGFR has been calculated using the CKD EPI equation.     This calculation has not been validated in all clinical situations.     eGFR's persistently <90 mL/min signify possible Chronic Kidney     Disease.  CBC WITH DIFFERENTIAL     Status: Abnormal   Collection Time    01/06/14  4:25 AM      Result Value Ref Range   WBC 12.7 (*) 4.0 - 10.5 K/uL   RBC 1.72 (*) 4.22 - 5.81 MIL/uL   Hemoglobin 5.6 (*) 13.0 - 17.0 g/dL   Comment: CRITICAL VALUE NOTED.  VALUE IS CONSISTENT WITH PREVIOUSLY REPORTED AND CALLED VALUE.     REPEATED TO VERIFY   HCT 16.9 (*) 39.0 - 52.0 %   MCV 98.3  78.0 - 100.0 fL   MCH 32.6  26.0 - 34.0 pg   MCHC 33.1  30.0 - 36.0 g/dL   RDW 17.5 (*) 11.5 - 15.5 %   Platelets 414 (*) 150 - 400 K/uL   Neutrophils Relative % 74  43 - 77 %   Lymphocytes Relative 23  12 - 46 %   Monocytes Relative 3  3 - 12 %   Eosinophils Relative 0  0 - 5 %   Basophils Relative 0  0 - 1 %   Neutro Abs 9.4 (*) 1.7 - 7.7 K/uL   Lymphs Abs 2.9  0.7 - 4.0 K/uL   Monocytes Absolute 0.4  0.1 - 1.0 K/uL   Eosinophils Absolute 0.0  0.0 - 0.7 K/uL   Basophils Absolute 0.0  0.0 - 0.1 K/uL   Labs are reviewed.  Current Facility-Administered Medications  Medication Dose Route Frequency Provider Last Rate Last Dose  . acetaminophen (TYLENOL) tablet 650 mg  650 mg Oral Q4H PRN Allyne Gee, MD   650 mg at 01/02/14 0000    Or  . acetaminophen (TYLENOL) suppository 650 mg  650 mg Rectal Q4H PRN Allyne Gee, MD      . albuterol (PROVENTIL) (2.5 MG/3ML) 0.083% nebulizer solution 2.5 mg  2.5 mg Nebulization Q6H PRN Allyne Gee, MD      . alum & mag hydroxide-simeth (MAALOX/MYLANTA) 200-200-20 MG/5ML suspension 15-30 mL  15-30 mL Oral Q4H PRN Allyne Gee, MD      . amitriptyline (ELAVIL) tablet 25 mg  25 mg Oral QHS Allyne Gee, MD   25 mg at 01/05/14 2237  . ceFEPIme (MAXIPIME) 1 g in dextrose 5 % 50 mL IVPB  1 g Intravenous 3  times per day Leana Gamer, MD   1 g at 01/06/14 1303  . diphenhydrAMINE (BENADRYL) capsule 25-50 mg  25-50 mg Oral Q6H PRN Leana Gamer, MD   25 mg at 01/03/14 1626  . folic acid (FOLVITE) tablet 1 mg  1 mg Oral q morning - 10a Allyne Gee, MD   1 mg at 01/06/14 0623  . heparin injection 5,000 Units  5,000 Units Subcutaneous 3 times per day Allyne Gee, MD   5,000 Units at 01/05/14 2248  . HYDROcodone-acetaminophen (NORCO/VICODIN) 5-325 MG per tablet 1-2 tablet  1-2 tablet Oral Q4H PRN Allyne Gee, MD      . HYDROmorphone (DILAUDID) PCA injection 0.3 mg/mL   Intravenous 6 times per day Elwyn Reach, MD      . ketorolac (TORADOL) 30 MG/ML injection 30 mg  30 mg Intravenous 4 times per day Allyne Gee, MD   30 mg at 01/06/14 1303  . LORazepam (ATIVAN) tablet 0.5-1 mg  0.5-1 mg Oral Q3H PRN Allyne Gee, MD       Or  . LORazepam (ATIVAN) injection 0.5-1 mg  0.5-1 mg Intravenous Q3H PRN Allyne Gee, MD      . morphine (MS CONTIN) 12 hr tablet 60 mg  60 mg Oral 3 times per day Leana Gamer, MD   60 mg at 01/06/14 1303  . multivitamin with minerals tablet 1 tablet  1 tablet Oral Daily Allyne Gee, MD   1 tablet at 01/06/14 7628  . naloxone [facility]) injection 0.4 mg  0.4 mg Intravenous PRN Melton Alar, PA-C       And  . sodium chloride 0.9 % injection 9 mL  9 mL Intravenous PRN Melton Alar, PA-C      . ondansetron (ZOFRAN) injection 4 mg  4 mg  Intravenous Q6H PRN Melton Alar, PA-C   4 mg at 01/05/14 1945  . pantoprazole (PROTONIX) EC tablet 40 mg  40 mg Oral Daily Allyne Gee, MD   40 mg at 01/06/14 3151  . polyethylene glycol (MIRALAX / GLYCOLAX) packet 17 g  17 g Oral BID PRN Allyne Gee, MD   17 g at 01/06/14 7616  . predniSONE (DELTASONE) tablet 40 mg  40 mg Oral BID WC Elwyn Reach, MD   40 mg at 01/06/14 0737  . promethazine (PHENERGAN) tablet 12.5-25 mg  12.5-25 mg Oral Q4H PRN Allyne Gee, MD       Or  . promethazine (PHENERGAN) suppository 12.5-25 mg  12.5-25 mg Rectal Q4H PRN Allyne Gee, MD      . senna-docusate (Senokot-S) tablet 1 tablet  1 tablet Oral BID Leana Gamer, MD   1 tablet at 01/06/14 [phone]  . sodium chloride 0.9 % injection 10-40 mL  10-40 mL Intracatheter Q12H Leana Gamer, MD   10 mL at 01/06/14 0953  . sodium chloride 0.9 % injection 10-40 mL  10-40 mL Intracatheter PRN Leana Gamer, MD      . sorbitol 70 % solution 30 mL  30 mL Oral Daily PRN Allyne Gee, MD        Psychiatric Specialty Exam:     Blood pressure 116/81, pulse 76, temperature 97.9 F (36.6 C), temperature source Oral, resp. rate 16, height 5' 9"  (1.753 m), weight 186 lb 4.6 oz (84.5 kg), SpO2 100.00%.Body mass index is 27.5 kg/(m^2).  General Appearance: Casual  Eye Contact::  Good  Speech:  Slow  Volume:  Normal  Mood:  Anxious  Affect:  Appropriate  Thought Process:  Logical  Orientation:  Full (Time, Place, and Person)  Thought Content:  WDL  Suicidal Thoughts:  No  Homicidal Thoughts:  No  Memory:  Immediate;   Good Recent;   Good Remote;   Good  Judgement:  Intact  Insight:  Good  Psychomotor Activity:  Normal  Concentration:  Fair  Recall:  Good  Fund of Knowledge:Good  Language: Good  Akathisia:  No  Handed:  Right  AIMS (if indicated):     Assets:  Communication Skills Desire for Improvement Housing Social Support  Sleep:      Musculoskeletal: Strength & Muscle Tone:  within normal limits Gait & Station: normal Patient leans: N/A  Treatment Plan Summary: The patient has capacity to participate in his treatment plan. patient is requesting counseling as an outpatient upon discharge.  The patient can be given outpatient referrals for counseling.  Please call [phone] if there is any further question.  Dossie Der T Nathalee Smarr 01/06/2014 3:15 PM

## 2014-01-06 NOTE — Progress Notes (Signed)
Lab had called earlier today about +bld culture. Made aware by pharmacist. Text paged NP on call about 1 + bld culture with gram - rods.

## 2014-01-07 LAB — CBC WITH DIFFERENTIAL/PLATELET
BASOS ABS: 0 10*3/uL (ref 0.0–0.1)
BASOS PCT: 0 % (ref 0–1)
Eosinophils Absolute: 0 10*3/uL (ref 0.0–0.7)
Eosinophils Relative: 0 % (ref 0–5)
HEMATOCRIT: 18.8 % — AB (ref 39.0–52.0)
HEMOGLOBIN: 6.2 g/dL — AB (ref 13.0–17.0)
LYMPHS ABS: 4.2 10*3/uL — AB (ref 0.7–4.0)
Lymphocytes Relative: 25 % (ref 12–46)
MCH: 33 pg (ref 26.0–34.0)
MCHC: 33 g/dL (ref 30.0–36.0)
MCV: 100 fL (ref 78.0–100.0)
MONO ABS: 1.5 10*3/uL — AB (ref 0.1–1.0)
Monocytes Relative: 9 % (ref 3–12)
NEUTROS ABS: 11.1 10*3/uL — AB (ref 1.7–7.7)
Neutrophils Relative %: 66 % (ref 43–77)
Platelets: 484 10*3/uL — ABNORMAL HIGH (ref 150–400)
RBC: 1.88 MIL/uL — ABNORMAL LOW (ref 4.22–5.81)
RDW: 19 % — ABNORMAL HIGH (ref 11.5–15.5)
WBC: 16.8 10*3/uL — ABNORMAL HIGH (ref 4.0–10.5)

## 2014-01-07 LAB — COMPREHENSIVE METABOLIC PANEL
ALT: 18 U/L (ref 0–53)
AST: 12 U/L (ref 0–37)
Albumin: 3.1 g/dL — ABNORMAL LOW (ref 3.5–5.2)
Alkaline Phosphatase: 84 U/L (ref 39–117)
BUN: 14 mg/dL (ref 6–23)
CALCIUM: 9.3 mg/dL (ref 8.4–10.5)
CO2: 28 meq/L (ref 19–32)
CREATININE: 0.7 mg/dL (ref 0.50–1.35)
Chloride: 103 mEq/L (ref 96–112)
GLUCOSE: 117 mg/dL — AB (ref 70–99)
Potassium: 4.6 mEq/L (ref 3.7–5.3)
SODIUM: 140 meq/L (ref 137–147)
Total Bilirubin: 0.7 mg/dL (ref 0.3–1.2)
Total Protein: 6.6 g/dL (ref 6.0–8.3)

## 2014-01-07 MED ORDER — HYDROMORPHONE 0.3 MG/ML IV SOLN
INTRAVENOUS | Status: DC
  Administered 2014-01-07: 6.49 mg via INTRAVENOUS
  Administered 2014-01-07: 3.99 mg via INTRAVENOUS
  Administered 2014-01-07: 7.6 mg via INTRAVENOUS
  Administered 2014-01-07 (×2): via INTRAVENOUS
  Administered 2014-01-08: 4.49 mg via INTRAVENOUS
  Administered 2014-01-08: 6.99 mg via INTRAVENOUS
  Administered 2014-01-08: 7.6 mg via INTRAVENOUS
  Administered 2014-01-08 (×2): via INTRAVENOUS
  Administered 2014-01-08: 2.99 mg via INTRAVENOUS
  Administered 2014-01-08 (×3): via INTRAVENOUS
  Administered 2014-01-08: 6.99 mg via INTRAVENOUS
  Administered 2014-01-08: 8.49 mg via INTRAVENOUS
  Administered 2014-01-08 – 2014-01-09 (×3): via INTRAVENOUS
  Administered 2014-01-09: 7.99 mg via INTRAVENOUS
  Administered 2014-01-09: 5.12 mg via INTRAVENOUS
  Administered 2014-01-09: 3.49 mg via INTRAVENOUS
  Filled 2014-01-07 (×10): qty 25

## 2014-01-07 MED ORDER — HYDROMORPHONE HCL 4 MG PO TABS
4.0000 mg | ORAL_TABLET | ORAL | Status: DC | PRN
  Administered 2014-01-07 – 2014-01-08 (×2): 4 mg via ORAL
  Filled 2014-01-07 (×2): qty 1

## 2014-01-07 NOTE — Progress Notes (Signed)
Subjective: Patient has improved now is between 5 and 6/10. He's still on the PCA pump using 23 mg in the last 24 hours was 40 demands and 38 deliveries. His hemoglobin seemed to have improved. His feeling better.  Objective: Vital signs in last 24 hours: Temp:  [97.8 F (36.6 C)-98.4 F (36.9 C)] 97.8 F (36.6 C) (04/26 0529) Pulse Rate:  [76-107] 77 (04/26 0529) Resp:  [13-18] 14 (04/26 0739) BP: (116-121)/(72-83) 120/77 mmHg (04/26 0529) SpO2:  [9 %-100 %] 100 % (04/26 0739) FiO2 (%):  [28 %] 28 % (04/26 0739) Weight change:  Last BM Date: 01/06/14  Intake/Output from previous day: 04/25 0701 - 04/26 0700 In: 1143.7 [P.O.:600; I.V.:493.7; IV Piggyback:50] Out: 1600 [Urine:1600] Intake/Output this shift: Total I/O In: 10 [I.V.:10] Out: -   General appearance: alert, cooperative and no distress Eyes: conjunctivae/corneas clear. PERRL, EOM's intact. Fundi benign. Neck: no adenopathy, no carotid bruit, no JVD, supple, symmetrical, trachea midline and thyroid not enlarged, symmetric, no tenderness/mass/nodules Back: symmetric, no curvature. ROM normal. No CVA tenderness. Resp: clear to auscultation bilaterally Chest wall: no tenderness Cardio: regular rate and rhythm, S1, S2 normal, no murmur, click, rub or gallop GI: soft, non-tender; bowel sounds normal; no masses,  no organomegaly Extremities: extremities normal, atraumatic, no cyanosis or edema Pulses: 2+ and symmetric Skin: Skin color, texture, turgor normal. No rashes or lesions Neurologic: Grossly normal  Lab Results:  Recent Labs  01/06/14 0425 01/07/14 0611  WBC 12.7* 16.8*  HGB 5.6* 6.2*  HCT 16.9* 18.8*  PLT 414* 484*   BMET  Recent Labs  01/05/14 0436 01/07/14 0611  NA 136* 140  K 4.9 4.6  CL 104 103  CO2 23 28  GLUCOSE 143* 117*  BUN 13 14  CREATININE 0.79 0.70  CALCIUM 8.9 9.3    Studies/Results: No results found.  Medications: I have reviewed the patient's current  medications.  Assessment/Plan: A 28 yo man with Sickle cell painful crisis and possible delayed transfusion reaction.  #1 Sickle Cell Painful crisis: We will continue to titrate his Dilaudid PCA and start oral pain medications.  #2 Anemia: From delayed transfusion reaction. H/H now improving on Prednisone.  #3 Delayed Transfusion reaction: Start titrating prednisone down.  #4 gram-negative rod bacteremia: On empiric antibiotics. Still identification and sensitivities are pending. We will continue until identification. Patient does not seem sick at this point.  #5 Chronic Pain Syndrome: Continue home medications  #6 AKI on CKDII: Renal function has returned to baseline.  #7 leukocytosis: Most likely from sickle cell crisis and steroid use. He seems to be getting worse since starting prednisone. We'll continue to monitor  #8 Ethics: Psychiatry has been consulted. Outpatient followup recommended    LOS: 6 days   Elwyn Reach 01/07/2014, 11:28 AM

## 2014-01-08 LAB — CBC WITH DIFFERENTIAL/PLATELET
BASOS PCT: 0 % (ref 0–1)
Basophils Absolute: 0 10*3/uL (ref 0.0–0.1)
EOS ABS: 0 10*3/uL (ref 0.0–0.7)
Eosinophils Relative: 0 % (ref 0–5)
HCT: 19 % — ABNORMAL LOW (ref 39.0–52.0)
HEMOGLOBIN: 6.2 g/dL — AB (ref 13.0–17.0)
LYMPHS PCT: 25 % (ref 12–46)
Lymphs Abs: 4.3 10*3/uL — ABNORMAL HIGH (ref 0.7–4.0)
MCH: 32.8 pg (ref 26.0–34.0)
MCHC: 32.6 g/dL (ref 30.0–36.0)
MCV: 100.5 fL — ABNORMAL HIGH (ref 78.0–100.0)
MONOS PCT: 15 % — AB (ref 3–12)
Monocytes Absolute: 2.6 10*3/uL — ABNORMAL HIGH (ref 0.1–1.0)
Neutro Abs: 10.2 10*3/uL — ABNORMAL HIGH (ref 1.7–7.7)
Neutrophils Relative %: 60 % (ref 43–77)
Platelets: 530 10*3/uL — ABNORMAL HIGH (ref 150–400)
RBC: 1.89 MIL/uL — ABNORMAL LOW (ref 4.22–5.81)
RDW: 20.1 % — ABNORMAL HIGH (ref 11.5–15.5)
WBC: 17.1 10*3/uL — AB (ref 4.0–10.5)

## 2014-01-08 LAB — CULTURE, BLOOD (ROUTINE X 2): CULTURE: NO GROWTH

## 2014-01-08 MED ORDER — HYDROMORPHONE HCL 2 MG PO TABS
4.0000 mg | ORAL_TABLET | ORAL | Status: DC
  Administered 2014-01-08 – 2014-01-20 (×68): 4 mg via ORAL
  Filled 2014-01-08: qty 1
  Filled 2014-01-08 (×9): qty 2
  Filled 2014-01-08 (×2): qty 1
  Filled 2014-01-08: qty 2
  Filled 2014-01-08 (×2): qty 1
  Filled 2014-01-08: qty 2
  Filled 2014-01-08: qty 1
  Filled 2014-01-08: qty 2
  Filled 2014-01-08 (×2): qty 1
  Filled 2014-01-08 (×3): qty 2
  Filled 2014-01-08 (×2): qty 1
  Filled 2014-01-08 (×5): qty 2
  Filled 2014-01-08: qty 1
  Filled 2014-01-08 (×4): qty 2
  Filled 2014-01-08: qty 1
  Filled 2014-01-08: qty 2
  Filled 2014-01-08: qty 1
  Filled 2014-01-08 (×3): qty 2
  Filled 2014-01-08 (×2): qty 1
  Filled 2014-01-08 (×11): qty 2
  Filled 2014-01-08 (×2): qty 1
  Filled 2014-01-08: qty 2
  Filled 2014-01-08: qty 1
  Filled 2014-01-08 (×2): qty 2
  Filled 2014-01-08: qty 1
  Filled 2014-01-08 (×2): qty 2
  Filled 2014-01-08: qty 1
  Filled 2014-01-08: qty 2
  Filled 2014-01-08: qty 1
  Filled 2014-01-08 (×4): qty 2

## 2014-01-08 NOTE — Progress Notes (Signed)
SICKLE CELL SERVICE PROGRESS NOTE  Dennis Bernard LFY:[id number] DOB: [birth year] DOA: 01/01/2014 PCP: Wilhelmina Mcardle, MD  Assessment/Plan: Principal Problem:   Sickle cell crisis Active Problems:   Chronic pain  1. Anemia: Pt has anemia with a baseline Hb of 7. He was transfused 1 unit of blood on 12/26/2012 during his last admission to [facility]. He has shown no signs of hemolysis and doubt that he is having a late transfusion reaction as his bilirubin and LDH are not elevated above his baseline.Pt has severe antibodies and should not be transfused unless he has clinical need for transfusion and not because of a number. I will discontinue the prednisone.  2. Hb SS with crisis:Pt has used 15.59 mg on PCA in the last 24 hours. I will  D/C PCA and schedule his oral medications and he will have PRN IV dilaudid for breakthrough. Anticipate discharge home tomorrow. 3. Leukocytosis: At this point I believe that it is related to Prednisone use. He also has a chronically elevated WBC which is related to his SCD and noncompliance with Hydrea. 4. Hypotension/ Sepsis: In retrospect was likely due to GNR bacteremia. Now resolved. 5.  Gram Negative Bacteremia: ID and sensitivity still pending. Continue Cefepime Day #4. 6. Fever: Resolved: May have been secondary to GNR bacteremia.    Code Status: Full Code  Family Communication: N/A Disposition Plan: Not yet ready for discharge  Leana Gamer  Pager [phone]. If 7PM-7AM, please contact night-coverage.  01/08/2014, 5:52 PM  LOS: 7 days   Brief narrative: Dennis Bernard is a 28 y.o. male with istory of sickle cell anemia presents to the ED with a few days of pain and shortness of breath and sickle cell crisis. Patient has had pain in his chest as well as his shoulder. Patient states that he has noted the pain to be sharp and is typical of his sickle crisis. He states last admission was in February sometime for this. He has been taking his pain  medications regularly lately due to increase in his symptoms. No cough noted. No hemoptysis is noted. Has not had any syncope. He has noted some fevers though he is not entirely sure about this. He has no nausea and no vointing. Denies having any abdominal pain   Consultants:  None  Procedures:  None  Antibiotics:  None  HPI/Subjective: Pt states that pain is improved. He has been up ambulating in room but not in hallways. He is tolerating diet without difficulty and his last BM was several days ago. Objective: Filed Vitals:   01/08/14 1030 01/08/14 1228 01/08/14 1410 01/08/14 1602  BP: 117/74  117/76   Pulse: 100  86   Temp: 98.1 F (36.7 C)  98.7 F (37.1 C)   TempSrc: Oral  Oral   Resp: 16 13 16 14   Height:      Weight:      SpO2: 100% 99% 95% 98%   Weight change:   Intake/Output Summary (Last 24 hours) at 01/08/14 1752 Last data filed at 01/08/14 1100  Gross per 24 hour  Intake    850 ml  Output   1950 ml  Net  -1100 ml    General: Alert, awake, oriented x3, in no acute distress. Pt clinically well appearing .  HEENT: [address]/AT PEERL, EOMI, anicteric Neck: Trachea midline,  no masses, no thyromegal,y no JVD, no carotid bruit OROPHARYNX:  Moist, No exudate/ erythema/lesions.  Heart: Regular rate and rhythm, without murmurs, rubs, gallops.  Lungs: Clear to  auscultation, no wheezing or rhonchi noted.  Abdomen: Soft, nontender, nondistended, positive bowel sounds, no masses no hepatosplenomegaly noted.  Neuro: No focal neurological deficits noted cranial nerves II through XII grossly intact.  Strength normal in bilateral upper and lower extremities. Musculoskeletal: No warm swelling or erythema around joints, no spinal tenderness noted. Psychiatric: Patient alert and oriented x3, good insight and cognition, good recent to remote recall. Lymph node survey: No cervical axillary or inguinal lymphadenopathy noted.   Data Reviewed: Basic Metabolic Panel:  Recent  Labs Lab 01/01/14 1831 01/02/14 0620 01/03/14 0408 01/05/14 0436 01/07/14 0611  NA 138 139 141 136* 140  K 3.6* 3.8 4.2 4.9 4.6  CL 103 104 107 104 103  CO2 22 21 23 23 28   GLUCOSE 116* 91 114* 143* 117*  BUN 14 16 26* 13 14  CREATININE 0.76 1.77* 1.41* 0.79 0.70  CALCIUM 9.3 8.6 8.4 8.9 9.3  MG  --  1.6  --   --   --    Liver Function Tests:  Recent Labs Lab 01/01/14 1831 01/02/14 0620 01/03/14 0408 01/05/14 0436 01/07/14 0611  AST 48* 76* 44* 22 12  ALT 27 45 39 24 18  ALKPHOS 98 101 105 92 84  BILITOT 1.5* 2.0* 2.8* 1.3* 0.7  PROT 7.5 6.4 6.4 6.8 6.6  ALBUMIN 3.8 3.2* 3.2* 3.3* 3.1*   No results found for this basename: LIPASE, AMYLASE,  in the last 168 hours No results found for this basename: AMMONIA,  in the last 168 hours CBC:  Recent Labs Lab 01/04/14 0540 01/05/14 0436 01/06/14 0425 01/07/14 0611 01/08/14 0605  WBC 7.2 9.2 12.7* 16.8* 17.1*  NEUTROABS 3.5 8.1* 9.4* 11.1* 10.2*  HGB 5.9* 5.8* 5.6* 6.2* 6.2*  HCT 17.3* 17.3* 16.9* 18.8* 19.0*  MCV 94.5 95.1 98.3 100.0 100.5*  PLT 268 341 414* 484* 530*   Cardiac Enzymes: No results found for this basename: CKTOTAL, CKMB, CKMBINDEX, TROPONINI,  in the last 168 hours BNP (last 3 results)  Recent Labs  10/20/13 1935 10/22/13 0300  PROBNP 471.8* 160.5*   CBG: No results found for this basename: GLUCAP,  in the last 168 hours  Recent Results (from the past 240 hour(s))  URINE CULTURE     Status: None   Collection Time    01/02/14  1:43 AM      Result Value Ref Range Status   Specimen Description URINE, CLEAN CATCH   Final   Special Requests Normal   Final   Culture  Setup Time     Final   Value: 01/02/2014 08:38     Performed at [facility]     Final   Value: NO GROWTH     Performed at [facility]   Culture     Final   Value: NO GROWTH     Performed at [facility]   Report Status 01/03/2014 FINAL   Final  CULTURE, BLOOD (ROUTINE X 2)      Status: None   Collection Time    01/02/14  6:10 AM      Result Value Ref Range Status   Specimen Description BLOOD LEFT HAND   Final   Special Requests BOTTLES DRAWN AEROBIC ONLY 2CC   Final   Culture  Setup Time     Final   Value: 01/02/2014 08:36     Performed at [facility]   Culture     Final   Value: GRAM NEGATIVE  RODS     Note: Gram Stain Report Called to,Read Back By and Verified With: SARA ON 4.24.15 @ 5:30pM BY [place]     Performed at [facility]   Report Status PENDING   Incomplete  CULTURE, BLOOD (ROUTINE X 2)     Status: None   Collection Time    01/02/14  6:20 AM      Result Value Ref Range Status   Specimen Description BLOOD LEFT HAND   Final   Special Requests BOTTLES DRAWN AEROBIC ONLY 2CC   Final   Culture  Setup Time     Final   Value: 01/02/2014 08:36     Performed at [facility]   Culture     Final   Value: NO GROWTH 5 DAYS     Performed at [facility]   Report Status 01/08/2014 FINAL   Final     Studies: Dg Chest 2 View  (if Recent History Of Cough Or Chest Pain)  01/01/2014   CLINICAL DATA:  Chest tightness, cough and fever  EXAM: CHEST  2 VIEW  COMPARISON:  Prior chest x-ray 11/06/2013  FINDINGS: Stable cardiac and mediastinal contours. Very low inspiratory volumes. Similar appearance of mild diffuse interstitial prominence. The central bronchial wall thickening similar compared to prior. No focal airspace consolidation. No pleural effusion or pneumothorax. Surgical changes of prior left shoulder arthroplasty. No acute osseous abnormality.  IMPRESSION: Stable chest x-ray without evidence of acute cardiopulmonary disease.   Electronically Signed   By: Jacqulynn Cadet M.D.   On: 01/01/2014 18:37    Scheduled Meds: . amitriptyline  25 mg Oral QHS  . ceFEPime (MAXIPIME) IV  1 g Intravenous 3 times per day  . folic acid  1 mg Oral q morning - 10a  . heparin  5,000 Units Subcutaneous 3 times per day  . HYDROmorphone PCA 0.3  mg/mL   Intravenous 6 times per day  . HYDROmorphone  4 mg Oral Q4H  . morphine  60 mg Oral 3 times per day  . multivitamin with minerals  1 tablet Oral Daily  . pantoprazole  40 mg Oral Daily  . predniSONE  40 mg Oral BID WC  . senna-docusate  1 tablet Oral BID  . sodium chloride  10-40 mL Intracatheter Q12H   Continuous Infusions:    Total time spent 42 minutes

## 2014-01-08 NOTE — Progress Notes (Signed)
Clinical Social Work  Per chart review, psych MD recommends outpatient resources. CSW provided outpatient information on 01/05/14 and patient agreeable to follow up once he DC from the hospital. Psych CSW is signing off but available if further needs arise.  [place], [place] [phone]

## 2014-01-08 NOTE — Progress Notes (Signed)
ANTIBIOTIC CONSULT NOTE - Follow Up  Pharmacy Consult for Cefepme Indication: 1 of 2 blood cxs growing GNRs  No Known Allergies  Patient Measurements: Height: 5\' 9"  (175.3 cm) Weight: 185 lb 10 oz (84.2 kg) IBW/kg (Calculated) : 70.7 Adjusted Body Weight:  Vital Signs: Temp: 98.1 F (36.7 C) (04/27 0551) Temp src: Oral (04/27 0551) BP: 124/81 mmHg (04/27 0551) Pulse Rate: 79 (04/27 0551) Intake/Output from previous day: 04/26 0701 - 04/27 0700 In: 950 [P.O.:840; I.V.:10; IV Piggyback:100] Out: 2050 [Urine:2050] Intake/Output from this shift: Total I/O In: 10 [I.V.:10] Out: -   Labs:  Recent Labs  01/06/14 0425 01/07/14 0611 01/08/14 0605  WBC 12.7* 16.8* 17.1*  HGB 5.6* 6.2* 6.2*  PLT 414* 484* 530*  CREATININE  --  0.70  --    Estimated Creatinine Clearance: 136.2 ml/min (by C-G formula based on Cr of 0.7). No results found for this basename: VANCOTROUGH, Corlis Leak, VANCORANDOM, [place], GENTPEAK, [place], [place], TOBRAPEAK, TOBRARND, AMIKACINPEAK, AMIKACINTROU, AMIKACIN,  in the last 72 hours   Microbiology: Recent Results (from the past 720 hour(s))  URINE CULTURE     Status: None   Collection Time    01/02/14  1:43 AM      Result Value Ref Range Status   Specimen Description URINE, CLEAN CATCH   Final   Special Requests Normal   Final   Culture  Setup Time     Final   Value: 01/02/2014 08:38     Performed at [facility]     Final   Value: NO GROWTH     Performed at [facility]   Culture     Final   Value: NO GROWTH     Performed at [facility]   Report Status 01/03/2014 FINAL   Final  CULTURE, BLOOD (ROUTINE X 2)     Status: None   Collection Time    01/02/14  6:10 AM      Result Value Ref Range Status   Specimen Description BLOOD LEFT HAND   Final   Special Requests BOTTLES DRAWN AEROBIC ONLY 2CC   Final   Culture  Setup Time     Final   Value: 01/02/2014 08:36     Performed at [facility]   Culture     Final   Value: GRAM NEGATIVE RODS     Note: Gram Stain Report Called to,Read Back By and Verified With: [place] ON 4.24.15 @ 5:30pM BY [place]     Performed at [facility]   Report Status PENDING   Incomplete  CULTURE, BLOOD (ROUTINE X 2)     Status: None   Collection Time    01/02/14  6:20 AM      Result Value Ref Range Status   Specimen Description BLOOD LEFT HAND   Final   Special Requests BOTTLES DRAWN AEROBIC ONLY [place]   Final   Culture  Setup Time     Final   Value: 01/02/2014 08:36     Performed at [facility]   Culture     Final   Value: NO GROWTH 5 DAYS     Performed at [facility]   Report Status 01/08/2014 FINAL   Final    Medical History: Past Medical History  Diagnosis Date  . Sickle cell disease   . Avascular necrosis of femur head, right   . H/O allergic rhinitis   . H/O hypokalemia   . Chronic pain  syndrome   . H/O wheezing     with colds  . Non-ABO incompatible blood transfusion 05/30/2013    Assessment: Dennis Bernard hospitalized for sickle cell crisis, noted to be possibly abusing IV pain medications during this admission, now with positive blood culture growing GNRs.  Pharmacy consulted to dose cefepime.  No allergies noted.    4/24 >> Cefepime  >>  Tmax: afebrile WBCs: 17.1, on steroids Renal: SCr 0.70, CrCl >100 (CG and N)  4/21 blood: 1/2 GNRs 4/21 urine: NG   Goal of Therapy:  Eradication of infection  Plan:  Continue Cefepime 1g IV q8h for now, f/u microbiology, clinical course   Adrian Saran, PharmD, BCPS Pager [phone] 01/08/2014 10:45 AM

## 2014-01-09 ENCOUNTER — Inpatient Hospital Stay (HOSPITAL_COMMUNITY): Payer: Medicare Other

## 2014-01-09 LAB — CBC WITH DIFFERENTIAL/PLATELET
BASOS ABS: 0 10*3/uL (ref 0.0–0.1)
Basophils Relative: 0 % (ref 0–1)
Eosinophils Absolute: 0 10*3/uL (ref 0.0–0.7)
Eosinophils Relative: 0 % (ref 0–5)
HCT: 20.3 % — ABNORMAL LOW (ref 39.0–52.0)
Hemoglobin: 6.6 g/dL — CL (ref 13.0–17.0)
Lymphocytes Relative: 15 % (ref 12–46)
Lymphs Abs: 3.5 10*3/uL (ref 0.7–4.0)
MCH: 33 pg (ref 26.0–34.0)
MCHC: 32.5 g/dL (ref 30.0–36.0)
MCV: 101.5 fL — ABNORMAL HIGH (ref 78.0–100.0)
Monocytes Absolute: 2.1 10*3/uL — ABNORMAL HIGH (ref 0.1–1.0)
Monocytes Relative: 9 % (ref 3–12)
Neutro Abs: 17.8 10*3/uL — ABNORMAL HIGH (ref 1.7–7.7)
Neutrophils Relative %: 76 % (ref 43–77)
PLATELETS: 550 10*3/uL — AB (ref 150–400)
RBC: 2 MIL/uL — AB (ref 4.22–5.81)
RDW: 20.3 % — AB (ref 11.5–15.5)
WBC: 23.4 10*3/uL — ABNORMAL HIGH (ref 4.0–10.5)

## 2014-01-09 LAB — COMPREHENSIVE METABOLIC PANEL
ALT: 22 U/L (ref 0–53)
AST: 15 U/L (ref 0–37)
Albumin: 3.1 g/dL — ABNORMAL LOW (ref 3.5–5.2)
Alkaline Phosphatase: 88 U/L (ref 39–117)
BILIRUBIN TOTAL: 0.9 mg/dL (ref 0.3–1.2)
BUN: 16 mg/dL (ref 6–23)
CO2: 29 mEq/L (ref 19–32)
CREATININE: 0.72 mg/dL (ref 0.50–1.35)
Calcium: 9.1 mg/dL (ref 8.4–10.5)
Chloride: 102 mEq/L (ref 96–112)
GLUCOSE: 109 mg/dL — AB (ref 70–99)
POTASSIUM: 4.4 meq/L (ref 3.7–5.3)
Sodium: 140 mEq/L (ref 137–147)
Total Protein: 6.3 g/dL (ref 6.0–8.3)

## 2014-01-09 MED ORDER — LEVOFLOXACIN 750 MG PO TABS
750.0000 mg | ORAL_TABLET | Freq: Every day | ORAL | Status: DC
  Administered 2014-01-09 – 2014-01-12 (×4): 750 mg via ORAL
  Filled 2014-01-09 (×4): qty 1

## 2014-01-09 MED ORDER — PREDNISONE 10 MG PO TABS: 10.0000 mg | ORAL_TABLET | Freq: Two times a day (BID) | ORAL | Status: DC

## 2014-01-09 MED ORDER — HYDROMORPHONE 0.3 MG/ML IV SOLN
INTRAVENOUS | Status: DC
  Administered 2014-01-09: 20:00:00 via INTRAVENOUS
  Administered 2014-01-09: 9.06 mg via INTRAVENOUS
  Administered 2014-01-09: via INTRAVENOUS
  Administered 2014-01-10: 17.7 mg via INTRAVENOUS
  Administered 2014-01-10: 9.6 mg via INTRAVENOUS
  Administered 2014-01-10 (×2): via INTRAVENOUS
  Administered 2014-01-10: 5.52 mg via INTRAVENOUS
  Administered 2014-01-10 (×4): via INTRAVENOUS
  Administered 2014-01-10: 2.8 mg via INTRAVENOUS
  Administered 2014-01-11: 16:00:00 via INTRAVENOUS
  Administered 2014-01-11: 15.1 mg via INTRAVENOUS
  Administered 2014-01-11: 8.99 mg via INTRAVENOUS
  Administered 2014-01-11: 2.5 mg via INTRAVENOUS
  Administered 2014-01-11 (×2): via INTRAVENOUS
  Administered 2014-01-11: 8.84 mg via INTRAVENOUS
  Administered 2014-01-11: 13:00:00 via INTRAVENOUS
  Administered 2014-01-11: 2.79 mg via INTRAVENOUS
  Filled 2014-01-09 (×12): qty 25

## 2014-01-09 MED ORDER — PREDNISONE 20 MG PO TABS
20.0000 mg | ORAL_TABLET | Freq: Two times a day (BID) | ORAL | Status: DC
Start: 2014-01-11 — End: 2014-01-09

## 2014-01-09 MED ORDER — HYDROMORPHONE HCL PF 2 MG/ML IJ SOLN
2.0000 mg | INTRAMUSCULAR | Status: DC | PRN
  Administered 2014-01-09 – 2014-01-13 (×24): 2 mg via INTRAVENOUS
  Filled 2014-01-09 (×25): qty 1

## 2014-01-09 MED ORDER — ONDANSETRON HCL 4 MG/2ML IJ SOLN
4.0000 mg | Freq: Four times a day (QID) | INTRAMUSCULAR | Status: DC | PRN
  Administered 2014-01-16 – 2014-01-20 (×2): 4 mg via INTRAVENOUS
  Filled 2014-01-09 (×2): qty 2

## 2014-01-09 MED ORDER — PREDNISONE 20 MG PO TABS
30.0000 mg | ORAL_TABLET | Freq: Two times a day (BID) | ORAL | Status: DC
  Filled 2014-01-09 (×2): qty 1

## 2014-01-09 MED ORDER — NALOXONE HCL 0.4 MG/ML IJ SOLN: 0.4000 mg | INTRAMUSCULAR | Status: DC | PRN

## 2014-01-09 MED ORDER — SODIUM CHLORIDE 0.9 % IJ SOLN: 9.0000 mL | INTRAMUSCULAR | Status: DC | PRN

## 2014-01-09 MED ORDER — HYDROMORPHONE HCL 4 MG PO TABS: 4.0000 mg | ORAL_TABLET | ORAL | Status: DC

## 2014-01-09 NOTE — Evaluation (Signed)
Physical Therapy Evaluation Patient Details Name: Dennis Bernard MRN: [id number] DOB: 1986-07-26 Today's Date: 01/09/2014   History of Present Illness  sicle cell crisis.  Clinical Impression  Pt compains of increased hip pain, minimal ambulation tolerated today, relied heavily on the RW for support initially> At end of second attempt  At walking, pt did let go RW and took several small steps to the bed. Pt will benefit from PT to address problems listed to return to independence.     Follow Up Recommendations Home health PT    Equipment Recommendations  Rolling walker with 5" wheels    Recommendations for Other Services       Precautions / Restrictions Precautions Precautions: Fall      Mobility  Bed Mobility Overal bed mobility: Modified Independent             General bed mobility comments: extra time to move self to edge and back into bed, performed x 2  Transfers Overall transfer level: Needs assistance Equipment used: Rolling walker (2 wheeled) Transfers: Sit to/from Stand Sit to Stand: Min guard            Ambulation/Gait Ambulation/Gait assistance: Min assist Ambulation Distance (Feet): 4 Feet Assistive device: Rolling walker (2 wheeled) Gait Pattern/deviations: Step-through pattern;Step-to pattern;Trunk flexed;Decreased stride length     General Gait Details: walked 4 feet forward and back, second time with Dr. Zigmund Daniel present, pt ambulated x 20' with RW,   Stairs            Wheelchair Mobility    Modified Rankin (Stroke Patients Only)       Balance                                             Pertinent Vitals/Pain Rates 10 in both hips, RN aware.    Home Living Family/patient expects to be discharged to:: Private residence Living Arrangements: Parent Available Help at Discharge: Family Type of Home: House Home Access: Stairs to enter   Technical brewer of Steps: 4 Home Layout: Two level Home  Equipment: None      Prior Function Level of Independence: Independent               Hand Dominance        Extremity/Trunk Assessment   Upper Extremity Assessment: Overall WFL for tasks assessed           Lower Extremity Assessment: RLE deficits/detail;LLE deficits/detail RLE Deficits / Details: able to support in standing, decreased hip flexion during short steps taken LLE Deficits / Details: same as R     Communication   Communication: No difficulties  Cognition Arousal/Alertness: Awake/alert Behavior During Therapy: Anxious (emotional) Overall Cognitive Status: Within Functional Limits for tasks assessed                      General Comments      Exercises        Assessment/Plan    PT Assessment Patient needs continued PT services  PT Diagnosis Difficulty walking;Acute pain   PT Problem List Decreased strength;Decreased range of motion;Decreased activity tolerance;Decreased mobility;Pain  PT Treatment Interventions DME instruction;Gait training;Stair training;Functional mobility training;Therapeutic activities;Therapeutic exercise;Patient/family education   PT Goals (Current goals can be found in the Care Plan section) Acute Rehab PT Goals Patient Stated Goal: I can't go home like this. I have to walk. PT  Goal Formulation: With patient Time For Goal Achievement: 01/23/14 Potential to Achieve Goals: Good    Frequency Min 3X/week   Barriers to discharge        Co-evaluation               End of Session   Activity Tolerance: Patient limited by pain Patient left: in bed;with call bell/phone within reach Nurse Communication: Mobility status         Time: 1221-1239 PT Time Calculation (min): 18 min   Charges:   PT Evaluation $Initial PT Evaluation Tier I: 1 Procedure PT Treatments $Gait Training: 8-22 mins   PT G Codes:          Claretha Cooper 01/09/2014, 1:52 PM Tresa Endo PT [phone]

## 2014-01-09 NOTE — Progress Notes (Signed)
Patient ID: Dennis Bernard, male   DOB: Nov 26, 1985, 27 y.o.   MRN: [id number] Reviewed X-rays for Dennis Bernard and it reveals AVN vs infarcts. A plain film is unable to distinguish acute from chronic pathology. In light of this I will order MRI and I will treat as acute until MRI can distinguish. I will restart Dilaudid PCA. Will follow up in the morning.

## 2014-01-09 NOTE — Progress Notes (Signed)
SICKLE CELL SERVICE PROGRESS NOTE  Dennis Bernard:[id number] DOB: 05/18/1986 DOA: 01/01/2014 PCP: Wilhelmina Mcardle, MD  Assessment/Plan: Principal Problem:   Sickle cell crisis Active Problems:   Chronic pain  1. Hb SS with crisis: Pt tearful and stating that he awoke this morning with acute pain in B/L hips which has rendered him unable to ambulate.  Pt has used 41 mg on PCA in the last 24 hours with 89 demands and 81 deliveries. I will  D/C PCA and schedule his oral medications and he will have PRN IV dilaudid for breakthrough. I will also obtain plain films of his hips to evaluate for AVN. The last x-rays available were from 2013. If he has evidence of AVN or infarcts I will resume PCA until MRI can confirm acuity or chronicity of pathology. I will also ask PT to evaluate his function. 2. Anemia: Pt has anemia with a baseline Hb of 7. He was transfused 1 unit of blood on 12/26/2012 during his last admission to [facility]. He has shown no signs of hemolysis and doubt that he is having a late transfusion reaction as his bilirubin and LDH are not elevated above his baseline.Pt has severe antibodies and should not be transfused unless he has clinical need for transfusion and not because of a number. I will discontinue the prednisone.  3. Hb SS with crisis:Leukocytosis: At this point I believe that it is related to Prednisone use. He also has a chronically elevated WBC which is related to his SCD and noncompliance with Hydrea. 4. Hypotension/ Sepsis: In retrospect was likely due to GNR bacteremia. Now resolved. 5.  Gram Negative Bacteremia: ID still pending but sensitivity shows sensitive to Cipro, Bactrim and Levaquin. Pt states that he thinks that he has had a reaction to Bactrim in the past so will treat with Levaquin 750 mg to complete a total of 14 days.  6. Fever: Resolved: May have been secondary to GNR bacteremia. 7. Psychosocial: Pt has had much difficulty during this admission with suspicious  behaviors surrounding his opiate analgesics. The patient admits to having difficulty with telling the truth as he wants to be endeared to his conversant. He admits to issues of trust and has fears of being stereotyped. I have spoken with patient at length on many occasions about the need for therapy to assist him in addressing his Psychosocial issues. He has refused in the past but now states that he would be willing to follow through on an outpatient basis. Dennis Bernard has misrepresented information and will often mischaracterize his pain to achieve secondary gain so that it is difficult to assess if the patient is actually having pain adversely impacting his function, or if he just wants to obtain medication. The patient also does not have a prescriber for his analgesics as he has burnt his bridges with multiple providers and thus he has much incentive for mis characterizing his pain as it is all subjective. Nevertheless I will pursue a radiological evaluation of his hips and have PT assess his function.    Code Status: Full Code  Family Communication: N/A Disposition Plan: Not yet ready for discharge  Leana Gamer  Pager [phone]. If 7PM-7AM, please contact night-coverage.  01/09/2014, 6:49 PM  LOS: 8 days   Brief narrative: Dennis Bernard is a 28 y.o. male with istory of sickle cell anemia presents to the ED with a few days of pain and shortness of breath and sickle cell crisis. Patient has had pain in his  chest as well as his shoulder. Patient states that he has noted the pain to be sharp and is typical of his sickle crisis. He states last admission was in February sometime for this. He has been taking his pain medications regularly lately due to increase in his symptoms. No cough noted. No hemoptysis is noted. Has not had any syncope. He has noted some fevers though he is not entirely sure about this. He has no nausea and no vointing. Denies having any abdominal  pain   Consultants:  None  Procedures:  None  Antibiotics:  None  HPI/Subjective: Pt tearful and stating that he awoke this morning with acute pain in B/L hips which has rendered him unable to ambulate. He rates the pain as 10/10 and sharp and non-radiating  in nature. I have spoken with the nurses who have brought to my attention that the patient has likely been able to ambulate to the bathroom without assistance as he has not had any urine emptied and he has not requested assistance. Pt states that he had a BM last night Objective: Filed Vitals:   01/09/14 0810 01/09/14 0955 01/09/14 1310 01/09/14 1748  BP:  130/83 126/83 133/96  Pulse:  96 100 118  Temp:  98.1 F (36.7 C) 98.6 F (37 C) 98.9 F (37.2 C)  TempSrc:  Oral Oral Oral  Resp: 15 18 12 14   Height:      Weight:      SpO2:  95% 100% 100%   Weight change: -3.6 oz (-0.103 kg)  Intake/Output Summary (Last 24 hours) at 01/09/14 1849 Last data filed at 01/09/14 1751  Gross per 24 hour  Intake    840 ml  Output   1650 ml  Net   -810 ml    General: Alert, awake, oriented x3, in no acute distress. Pt tearful and stating that he is unable to ambulate because of pain.  HEENT: [place]/AT PEERL, EOMI, anicteric Neck: Trachea midline,  no masses, no thyromegal,y no JVD, no carotid bruit OROPHARYNX:  Moist, No exudate/ erythema/lesions.  Heart: Regular rate and rhythm, without murmurs, rubs, gallops.  Lungs: Clear to auscultation, no wheezing or rhonchi noted.  Abdomen: Soft, nontender, nondistended, positive bowel sounds, no masses no hepatosplenomegaly noted.  Neuro: No focal neurological deficits noted cranial nerves II through XII grossly intact.  Strength normal in bilateral upper and lower extremities. Musculoskeletal: No warm swelling or erythema around joints, no spinal tenderness noted. Psychiatric: Patient alert and oriented x3, good insight and cognition, good recent to remote recall. Lymph node survey: No  cervical axillary or inguinal lymphadenopathy noted.   Data Reviewed: Basic Metabolic Panel:  Recent Labs Lab 01/03/14 0408 01/05/14 0436 01/07/14 0611 01/09/14 0550  NA 141 136* 140 140  K 4.2 4.9 4.6 4.4  CL 107 104 103 102  CO2 23 23 28 29   GLUCOSE 114* 143* 117* 109*  BUN 26* 13 14 16   CREATININE 1.41* 0.79 0.70 0.72  CALCIUM 8.4 8.9 9.3 9.1   Liver Function Tests:  Recent Labs Lab 01/03/14 0408 01/05/14 0436 01/07/14 0611 01/09/14 0550  AST 44* 22 12 15   ALT 39 24 18 22   ALKPHOS 105 92 84 88  BILITOT 2.8* 1.3* 0.7 0.9  PROT 6.4 6.8 6.6 6.3  ALBUMIN 3.2* 3.3* 3.1* 3.1*   No results found for this basename: LIPASE, AMYLASE,  in the last 168 hours No results found for this basename: AMMONIA,  in the last 168 hours CBC:  Recent Labs  Lab 01/05/14 0436 01/06/14 0425 01/07/14 0611 01/08/14 0605 01/09/14 0550  WBC 9.2 12.7* 16.8* 17.1* 23.4*  NEUTROABS 8.1* 9.4* 11.1* 10.2* 17.8*  HGB 5.8* 5.6* 6.2* 6.2* 6.6*  HCT 17.3* 16.9* 18.8* 19.0* 20.3*  MCV 95.1 98.3 100.0 100.5* 101.5*  PLT 341 414* 484* 530* 550*   Cardiac Enzymes: No results found for this basename: CKTOTAL, CKMB, CKMBINDEX, TROPONINI,  in the last 168 hours BNP (last 3 results)  Recent Labs  10/20/13 1935 10/22/13 0300  PROBNP 471.8* 160.5*   CBG: No results found for this basename: GLUCAP,  in the last 168 hours  Recent Results (from the past 240 hour(s))  URINE CULTURE     Status: None   Collection Time    01/02/14  1:43 AM      Result Value Ref Range Status   Specimen Description URINE, CLEAN CATCH   Final   Special Requests Normal   Final   Culture  Setup Time     Final   Value: 01/02/2014 08:38     Performed at [place]     Final   Value: NO GROWTH     Performed at [facility]   Culture     Final   Value: NO GROWTH     Performed at [facility]   Report Status 01/03/2014 FINAL   Final  CULTURE, BLOOD (ROUTINE X 2)     Status:  None   Collection Time    01/02/14  6:10 AM      Result Value Ref Range Status   Specimen Description BLOOD LEFT HAND   Final   Special Requests BOTTLES DRAWN AEROBIC ONLY 2CC   Final   Culture  Setup Time     Final   Value: 01/02/2014 08:36     Performed at [facility]   Culture     Final   Value: GRAM NEGATIVE RODS     Note: Gram Stain Report Called to,Read Back By and Verified With: [place] ON 4.24.15 @ 5:30pM BY [place]     Performed at [facility]   Report Status PENDING   Incomplete   Organism ID, Bacteria GRAM NEGATIVE RODS   Final  CULTURE, BLOOD (ROUTINE X 2)     Status: None   Collection Time    01/02/14  6:20 AM      Result Value Ref Range Status   Specimen Description BLOOD LEFT HAND   Final   Special Requests BOTTLES DRAWN AEROBIC ONLY 2CC   Final   Culture  Setup Time     Final   Value: 01/02/2014 08:36     Performed at [facility]   Culture     Final   Value: NO GROWTH 5 DAYS     Performed at [facility]   Report Status 01/08/2014 FINAL   Final     Studies: Dg Chest 2 View  (if Recent History Of Cough Or Chest Pain)  01/01/2014   CLINICAL DATA:  Chest tightness, cough and fever  EXAM: CHEST  2 VIEW  COMPARISON:  Prior chest x-ray 11/06/2013  FINDINGS: Stable cardiac and mediastinal contours. Very low inspiratory volumes. Similar appearance of mild diffuse interstitial prominence. The central bronchial wall thickening similar compared to prior. No focal airspace consolidation. No pleural effusion or pneumothorax. Surgical changes of prior left shoulder arthroplasty. No acute osseous abnormality.  IMPRESSION: Stable chest x-ray without evidence of acute cardiopulmonary disease.  Electronically Signed   By: Jacqulynn Cadet M.D.   On: 01/01/2014 18:37    Scheduled Meds: . amitriptyline  25 mg Oral QHS  . folic acid  1 mg Oral q morning - 10a  . heparin  5,000 Units Subcutaneous 3 times per day  . HYDROmorphone  4 mg Oral Q4H  .  levofloxacin  750 mg Oral Daily  . morphine  60 mg Oral 3 times per day  . multivitamin with minerals  1 tablet Oral Daily  . pantoprazole  40 mg Oral Daily  . senna-docusate  1 tablet Oral BID  . sodium chloride  10-40 mL Intracatheter Q12H   Continuous Infusions:    Total time spent 55 minutes

## 2014-01-10 ENCOUNTER — Inpatient Hospital Stay (HOSPITAL_COMMUNITY): Payer: Medicare Other

## 2014-01-10 DIAGNOSIS — R7881 Bacteremia: Secondary | ICD-10-CM

## 2014-01-10 DIAGNOSIS — R269 Unspecified abnormalities of gait and mobility: Secondary | ICD-10-CM

## 2014-01-10 LAB — CBC WITH DIFFERENTIAL/PLATELET
BASOS PCT: 0 % (ref 0–1)
Basophils Absolute: 0 10*3/uL (ref 0.0–0.1)
Eosinophils Absolute: 0 10*3/uL (ref 0.0–0.7)
Eosinophils Relative: 0 % (ref 0–5)
HEMATOCRIT: 20 % — AB (ref 39.0–52.0)
Hemoglobin: 6.6 g/dL — CL (ref 13.0–17.0)
LYMPHS ABS: 4.1 10*3/uL — AB (ref 0.7–4.0)
Lymphocytes Relative: 20 % (ref 12–46)
MCH: 33 pg (ref 26.0–34.0)
MCHC: 33 g/dL (ref 30.0–36.0)
MCV: 100 fL (ref 78.0–100.0)
Monocytes Absolute: 2.7 10*3/uL — ABNORMAL HIGH (ref 0.1–1.0)
Monocytes Relative: 13 % — ABNORMAL HIGH (ref 3–12)
Neutro Abs: 13.7 10*3/uL — ABNORMAL HIGH (ref 1.7–7.7)
Neutrophils Relative %: 67 % (ref 43–77)
Platelets: 540 10*3/uL — ABNORMAL HIGH (ref 150–400)
RBC: 2 MIL/uL — ABNORMAL LOW (ref 4.22–5.81)
RDW: 20.2 % — ABNORMAL HIGH (ref 11.5–15.5)
WBC: 20.5 10*3/uL — ABNORMAL HIGH (ref 4.0–10.5)
nRBC: 49 /100 WBC — ABNORMAL HIGH

## 2014-01-10 LAB — COMPREHENSIVE METABOLIC PANEL
ALT: 20 U/L (ref 0–53)
AST: 25 U/L (ref 0–37)
Albumin: 3.4 g/dL — ABNORMAL LOW (ref 3.5–5.2)
Alkaline Phosphatase: 103 U/L (ref 39–117)
BILIRUBIN TOTAL: 1.2 mg/dL (ref 0.3–1.2)
BUN: 18 mg/dL (ref 6–23)
CHLORIDE: 99 meq/L (ref 96–112)
CO2: 29 meq/L (ref 19–32)
CREATININE: 0.76 mg/dL (ref 0.50–1.35)
Calcium: 9.4 mg/dL (ref 8.4–10.5)
GFR calc Af Amer: >90 mL/min (ref >90)
Glucose, Bld: 97 mg/dL (ref 70–99)
Potassium: 4 mEq/L (ref 3.7–5.3)
Sodium: 139 mEq/L (ref 137–147)
Total Protein: 6.7 g/dL (ref 6.0–8.3)

## 2014-01-10 LAB — CULTURE, BLOOD (ROUTINE X 2)

## 2014-01-10 LAB — LACTATE DEHYDROGENASE: LDH: 518 U/L — ABNORMAL HIGH (ref 94–250)

## 2014-01-10 MED ORDER — KETOROLAC TROMETHAMINE 30 MG/ML IJ SOLN
30.0000 mg | Freq: Once | INTRAMUSCULAR | Status: AC
  Administered 2014-01-10: 30 mg via INTRAVENOUS
  Filled 2014-01-10: qty 1
  Filled 2014-01-10: qty 2

## 2014-01-10 MED ORDER — HYDROMORPHONE HCL PF 2 MG/ML IJ SOLN
2.0000 mg | Freq: Once | INTRAMUSCULAR | Status: AC
  Administered 2014-01-10: 2 mg via INTRAVENOUS

## 2014-01-10 MED ORDER — SODIUM CHLORIDE 0.9 % IV BOLUS (SEPSIS)
500.0000 mL | Freq: Once | INTRAVENOUS | Status: AC
  Administered 2014-01-10: 500 mL via INTRAVENOUS

## 2014-01-10 MED ORDER — METOPROLOL TARTRATE 1 MG/ML IV SOLN
5.0000 mg | Freq: Once | INTRAVENOUS | Status: AC
  Administered 2014-01-10: 5 mg via INTRAVENOUS
  Filled 2014-01-10: qty 5

## 2014-01-10 NOTE — Progress Notes (Signed)
Patient still with pain 10/10 in hips. Paged NP on call. One time order for toradol obtained.

## 2014-01-10 NOTE — Plan of Care (Signed)
Problem: Phase III Progression Outcomes Goal: Pain controlled on oral analgesia Outcome: Not Met (add Reason) PCA resumed on 4/28

## 2014-01-10 NOTE — Plan of Care (Signed)
Problem: Phase III Progression Outcomes Goal: Progress with ambulation Outcome: Not Met (add Reason) Patient with pain in hips. Xray + AVN. PT eval done.

## 2014-01-10 NOTE — Care Management Note (Signed)
Cm spoke with patient at the bedside concerning discharge planning. PT recommendation for HHPT. Pt offered choice of [facility] agencies in [place]. Pt declined home health services at this time. Pt states needs consultation from [facility] concerning oupt psychiatric services. CSW notified.    Venita Lick Kyston Gonce,MSN,RN [phone]

## 2014-01-10 NOTE — Progress Notes (Signed)
CSW received notification from [facility] that pt requesting outpatient mental health services resources.  CSW reviewed chart and noted that pt was seen by psych CSW while inpatient and provided resources at that time.  CSW met with pt at bedside and pt confirmed that he still had resources that psych CSW provided. CSW provided pt with an envelope in order to keep all the documents he is receiving together. Pt expressed appreciation.  CSW signing off.   Please re-consult if further social work needs arise.   Alison Murray, MSW, [place] [facility] [phone]

## 2014-01-10 NOTE — Progress Notes (Signed)
SICKLE CELL SERVICE PROGRESS NOTE  Dennis Bernard ZOX:[id number] DOB: Dec 06, 1985 DOA: 01/01/2014 PCP: Wilhelmina Mcardle, MD  Assessment/Plan: Principal Problem:   Sickle cell crisis Active Problems:   Chronic pain  1. Hb SS with crisis: Pt on PCA since yesterday and states that his pain is still increased. He has used 30.4 mg with 52 demands and 44 deliveries within 17 hours. He refused the MRI last night and thus we are still unaware if the changes are acute or chronic. I have advised the patient that the MRI is necessary and that we will make decisions regarding his pain regimen after the MRI is reviewed.  2. Anemia: Pt has anemia with a baseline Hb of 7. He was transfused 1 unit of blood on 12/26/2012 during his last admission to [facility]. He has shown no signs of hemolysis and doubt that he is having a late transfusion reaction as his bilirubin and LDH are not elevated above his baseline.Pt has severe antibodies and should not be transfused unless he has clinical need for transfusion and not because of a number. 3. Leukocytosis: At this point I believe that it is related to Prednisone use. He also has a chronically elevated WBC which is related to his SCD and noncompliance with Hydrea. No signs of infection. 4. Hypotension/ Sepsis: In retrospect was likely due to GNR bacteremia. Now resolved. 5.  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 Pt states that he thinks that he has had a reaction to Bactrim in the past so will treat with Levaquin 750 mg to complete a total of 14 days.  6. Fever: Resolved: May have been secondary to GNR bacteremia. 7. Psychosocial: Pt has had much difficulty during this admission with suspicious behaviors surrounding his opiate analgesics. The patient admits to having difficulty with telling the truth as he wants to be endeared to his conversant. He admits to issues of trust and has fears of being  stereotyped. I have spoken with patient at length on many occasions about the need for therapy to assist him in addressing his Psychosocial issues. He has refused in the past but now states that he would be willing to follow through on an outpatient basis. Dennis Bernard has misrepresented information and will often mischaracterize his pain to achieve secondary gain so that it is difficult to assess if the patient is actually having pain adversely impacting his function, or if he just wants to obtain medication. The patient also does not have a prescriber for his analgesics as he has burnt his bridges with multiple providers and thus he has much incentive for mis characterizing his pain as it is all subjective. Nevertheless I will pursue a radiological evaluation of his hips and have PT assess his function.    Code Status: Full Code  Family Communication: N/A Disposition Plan: Not yet ready for discharge  Leana Gamer  Pager [phone]. If 7PM-7AM, please contact night-coverage.  01/10/2014, 7:56 PM  LOS: 9 days   Brief narrative: Dennis Bernard is a 28 y.o. male with istory of sickle cell anemia presents to the ED with a few days of pain and shortness of breath and sickle cell crisis. Patient has had pain in his chest as well as his shoulder. Patient states that he has noted the pain to be sharp and is typical of his sickle crisis. He states last admission was in February sometime for this. He has been taking his pain medications regularly lately  due to increase in his symptoms. No cough noted. No hemoptysis is noted. Has not had any syncope. He has noted some fevers though he is not entirely sure about this. He has no nausea and no vointing. Denies having any abdominal pain   Consultants:  None  Procedures:  None  Antibiotics:  None  HPI/Subjective: Pt states that pain in B/L hips improved. Pt able to ambulate today. He rates the pain as 8/10 and sharp and non-radiating  in nature.   Objective: Filed Vitals:   01/10/14 1805 01/10/14 1934 01/10/14 1938 01/10/14 1950  BP: 138/96     Pulse: 141   138  Temp: 99.9 F (37.7 C)     TempSrc: Oral     Resp: 23 24 27 26   Height:      Weight:      SpO2: 100% 100% 100% 100%   Weight change:   Intake/Output Summary (Last 24 hours) at 01/10/14 1956 Last data filed at 01/10/14 1806  Gross per 24 hour  Intake 1711.63 ml  Output   3975 ml  Net -2263.37 ml    General: Alert, awake, oriented x3, in no acute distress. Pt resting comfortably in bed. HEENT: [place]/AT PEERL, EOMI, anicteric Neck: Trachea midline,  no masses, no thyromegal,y no JVD, no carotid bruit OROPHARYNX:  Moist, No exudate/ erythema/lesions.  Heart: Regular rate and rhythm, without murmurs, rubs, gallops.  Lungs: Clear to auscultation, no wheezing or rhonchi noted.  Abdomen: Soft, nontender, nondistended, positive bowel sounds, no masses no hepatosplenomegaly noted.  Neuro: No focal neurological deficits noted cranial nerves II through XII grossly intact.  Strength normal in bilateral upper and lower extremities. Musculoskeletal: No warm swelling or erythema around joints, no spinal tenderness noted. Psychiatric: Patient alert and oriented x3, good insight and cognition, good recent to remote recall. Lymph node survey: No cervical axillary or inguinal lymphadenopathy noted.   Data Reviewed: Basic Metabolic Panel:  Recent Labs Lab 01/05/14 0436 01/07/14 0611 01/09/14 0550 01/10/14 0345  NA 136* 140 140 139  K 4.9 4.6 4.4 4.0  CL 104 103 102 99  CO2 23 28 29 29   GLUCOSE 143* 117* 109* 97  BUN 13 14 16 18   CREATININE 0.79 0.70 0.72 0.76  CALCIUM 8.9 9.3 9.1 9.4   Liver Function Tests:  Recent Labs Lab 01/05/14 0436 01/07/14 0611 01/09/14 0550 01/10/14 0345  AST 22 12 15 25   ALT 24 18 22 20   ALKPHOS 92 84 88 103  BILITOT 1.3* 0.7 0.9 1.2  PROT 6.8 6.6 6.3 6.7  ALBUMIN 3.3* 3.1* 3.1* 3.4*   No results found for this basename: LIPASE,  AMYLASE,  in the last 168 hours No results found for this basename: AMMONIA,  in the last 168 hours CBC:  Recent Labs Lab 01/06/14 0425 01/07/14 0611 01/08/14 0605 01/09/14 0550 01/10/14 0345  WBC 12.7* 16.8* 17.1* 23.4* 20.5*  NEUTROABS 9.4* 11.1* 10.2* 17.8* 13.7*  HGB 5.6* 6.2* 6.2* 6.6* 6.6*  HCT 16.9* 18.8* 19.0* 20.3* 20.0*  MCV 98.3 100.0 100.5* 101.5* 100.0  PLT 414* 484* 530* 550* 540*   Cardiac Enzymes: No results found for this basename: CKTOTAL, CKMB, CKMBINDEX, TROPONINI,  in the last 168 hours BNP (last 3 results)  Recent Labs  10/20/13 1935 10/22/13 0300  PROBNP 471.8* 160.5*   CBG: No results found for this basename: GLUCAP,  in the last 168 hours  Recent Results (from the past 240 hour(s))  URINE CULTURE     Status: None  Collection Time    01/02/14  1:43 AM      Result Value Ref Range Status   Specimen Description URINE, CLEAN CATCH   Final   Special Requests Normal   Final   Culture  Setup Time     Final   Value: 01/02/2014 08:38     Performed at [place]     Final   Value: NO GROWTH     Performed at [facility]   Culture     Final   Value: NO GROWTH     Performed at [facility]   Report Status 01/03/2014 FINAL   Final  CULTURE, BLOOD (ROUTINE X 2)     Status: None   Collection Time    01/02/14  6:10 AM      Result Value Ref Range Status   Specimen Description BLOOD LEFT HAND   Final   Special Requests BOTTLES DRAWN AEROBIC ONLY 2CC   Final   Culture  Setup Time     Final   Value: 01/02/2014 08:36     Performed at [facility]   Culture     Final   Value: BURKHOLDERIA PICKETTII     Note: Gram Stain Report Called to,Read Back By and Verified With: [place] ON 4.24.15 @ 5:30pM BY [place]     Performed at [facility]   Report Status 01/10/2014 FINAL   Final   Organism ID, Bacteria BURKHOLDERIA PICKETTII   Final  CULTURE, BLOOD (ROUTINE X 2)     Status: None   Collection Time     01/02/14  6:20 AM      Result Value Ref Range Status   Specimen Description BLOOD LEFT HAND   Final   Special Requests BOTTLES DRAWN AEROBIC ONLY 2CC   Final   Culture  Setup Time     Final   Value: 01/02/2014 08:36     Performed at [facility]   Culture     Final   Value: NO GROWTH 5 DAYS     Performed at [facility]   Report Status 01/08/2014 FINAL   Final     Studies: Dg Chest 2 View  (if Recent History Of Cough Or Chest Pain)  01/01/2014   CLINICAL DATA:  Chest tightness, cough and fever  EXAM: CHEST  2 VIEW  COMPARISON:  Prior chest x-ray 11/06/2013  FINDINGS: Stable cardiac and mediastinal contours. Very low inspiratory volumes. Similar appearance of mild diffuse interstitial prominence. The central bronchial wall thickening similar compared to prior. No focal airspace consolidation. No pleural effusion or pneumothorax. Surgical changes of prior left shoulder arthroplasty. No acute osseous abnormality.  IMPRESSION: Stable chest x-ray without evidence of acute cardiopulmonary disease.   Electronically Signed   By: Jacqulynn Cadet M.D.   On: 01/01/2014 18:37    Scheduled Meds: . amitriptyline  25 mg Oral QHS  . folic acid  1 mg Oral q morning - 10a  . heparin  5,000 Units Subcutaneous 3 times per day  . HYDROmorphone PCA 0.3 mg/mL   Intravenous 6 times per day  . HYDROmorphone  4 mg Oral Q4H  . levofloxacin  750 mg Oral Daily  . morphine  60 mg Oral 3 times per day  . multivitamin with minerals  1 tablet Oral Daily  . pantoprazole  40 mg Oral Daily  . senna-docusate  1 tablet Oral BID  . sodium chloride  500 mL Intravenous Once  .  sodium chloride  10-40 mL Intracatheter Q12H   Continuous Infusions:    Total time spent 40 minutes

## 2014-01-11 DIAGNOSIS — Z91199 Patient's noncompliance with other medical treatment and regimen due to unspecified reason: Secondary | ICD-10-CM

## 2014-01-11 DIAGNOSIS — Z9119 Patient's noncompliance with other medical treatment and regimen: Secondary | ICD-10-CM

## 2014-01-11 DIAGNOSIS — R509 Fever, unspecified: Secondary | ICD-10-CM

## 2014-01-11 LAB — CBC WITH DIFFERENTIAL/PLATELET
BASOS ABS: 0 10*3/uL (ref 0.0–0.1)
Basophils Relative: 0 % (ref 0–1)
EOS ABS: 0 10*3/uL (ref 0.0–0.7)
Eosinophils Relative: 0 % (ref 0–5)
HCT: 21.4 % — ABNORMAL LOW (ref 39.0–52.0)
Hemoglobin: 7 g/dL — ABNORMAL LOW (ref 13.0–17.0)
Lymphocytes Relative: 12 % (ref 12–46)
Lymphs Abs: 2.3 10*3/uL (ref 0.7–4.0)
MCH: 32.6 pg (ref 26.0–34.0)
MCHC: 32.7 g/dL (ref 30.0–36.0)
MCV: 99.5 fL (ref 78.0–100.0)
MONO ABS: 2.5 10*3/uL — AB (ref 0.1–1.0)
MONOS PCT: 13 % — AB (ref 3–12)
NEUTROS ABS: 14.3 10*3/uL — AB (ref 1.7–7.7)
Neutrophils Relative %: 75 % (ref 43–77)
Platelets: 449 10*3/uL — ABNORMAL HIGH (ref 150–400)
RBC: 2.15 MIL/uL — ABNORMAL LOW (ref 4.22–5.81)
RDW: 18.6 % — AB (ref 11.5–15.5)
WBC: 19.1 10*3/uL — ABNORMAL HIGH (ref 4.0–10.5)
nRBC: 15 /100 WBC — ABNORMAL HIGH

## 2014-01-11 LAB — URINALYSIS, ROUTINE W REFLEX MICROSCOPIC
BILIRUBIN URINE: NEGATIVE
Glucose, UA: NEGATIVE mg/dL
HGB URINE DIPSTICK: NEGATIVE
Ketones, ur: NEGATIVE mg/dL
Leukocytes, UA: NEGATIVE
Nitrite: NEGATIVE
PROTEIN: NEGATIVE mg/dL
Specific Gravity, Urine: 1.011 (ref 1.005–1.030)
Urobilinogen, UA: 1 mg/dL (ref 0.0–1.0)
pH: 6.5 (ref 5.0–8.0)

## 2014-01-11 LAB — LACTIC ACID, PLASMA: Lactic Acid, Venous: 0.6 mmol/L (ref 0.5–2.2)

## 2014-01-11 LAB — LACTATE DEHYDROGENASE: LDH: 603 U/L — ABNORMAL HIGH (ref 94–250)

## 2014-01-11 MED ORDER — HYDROMORPHONE 0.3 MG/ML IV SOLN
INTRAVENOUS | Status: DC
  Administered 2014-01-11: 21:00:00 via INTRAVENOUS
  Administered 2014-01-11: 5.59 mg via INTRAVENOUS
  Administered 2014-01-12 (×2): via INTRAVENOUS
  Administered 2014-01-12: 6.79 mg via INTRAVENOUS
  Administered 2014-01-12: 0.3 mg via INTRAVENOUS
  Administered 2014-01-12: 4.79 mg via INTRAVENOUS
  Administered 2014-01-12: 02:00:00 via INTRAVENOUS
  Administered 2014-01-12: 3.2 mg via INTRAVENOUS
  Administered 2014-01-13: 7.46 mg via INTRAVENOUS
  Administered 2014-01-13: 0.3 mg via INTRAVENOUS
  Administered 2014-01-13 (×2): via INTRAVENOUS
  Administered 2014-01-13: 6.79 mg via INTRAVENOUS
  Administered 2014-01-13: 3.59 mg via INTRAVENOUS
  Administered 2014-01-13: 1.6 mg via INTRAVENOUS
  Administered 2014-01-13: 5.99 mg via INTRAVENOUS
  Administered 2014-01-13: 0.744 mg via INTRAVENOUS
  Administered 2014-01-14: 7.59 mg via INTRAVENOUS
  Administered 2014-01-14: 1.99 mg via INTRAVENOUS
  Filled 2014-01-11 (×10): qty 25

## 2014-01-11 MED ORDER — ACETAMINOPHEN 500 MG PO TABS
1000.0000 mg | ORAL_TABLET | ORAL | Status: AC
  Administered 2014-01-11: 1000 mg via ORAL
  Filled 2014-01-11: qty 2

## 2014-01-11 MED ORDER — IBUPROFEN 600 MG PO TABS
600.0000 mg | ORAL_TABLET | Freq: Once | ORAL | Status: AC
  Administered 2014-01-11: 600 mg via ORAL
  Filled 2014-01-11: qty 1

## 2014-01-11 NOTE — Progress Notes (Addendum)
Paged NP with VItals and pulse. Temp 99.8, bp 140/95, pulse 140, resp 28, 100 % 1l [place] . Orders received for lopressor.

## 2014-01-11 NOTE — Progress Notes (Signed)
Paged NP Baltazar Najjar with EKG results. No new orders

## 2014-01-11 NOTE — Progress Notes (Signed)
Paged NP on call about elevated Pulse and low grade fever. Orders for CXR and fluid bolus obtained.

## 2014-01-11 NOTE — Progress Notes (Signed)
PT Cancellation Note  Patient Details Name: Dennis Bernard MRN: [id number] DOB: 1986/07/20   Cancelled Treatment:    Reason Eval/Treat Not Completed: Medical issues which prohibited therapy (running fever, low BP,)   Shella Maxim [facility] 01/11/2014, 4:29 PM Tresa Endo PT [phone]

## 2014-01-11 NOTE — Progress Notes (Signed)
Patient's vitals : temp 101, pulse 128, BP 128/89, resp 28, sats 100% 1l [place] . Paged NP Baltazar Najjar on call. Orders received.

## 2014-01-11 NOTE — Progress Notes (Signed)
Pulse did decrease to 115-120 within 10 minutes after IV lopressor.

## 2014-01-11 NOTE — Progress Notes (Signed)
SICKLE CELL SERVICE PROGRESS NOTE  Dennis Bernard CHE:[id number] DOB: 09-Jan-1986 DOA: 01/01/2014 PCP: Wilhelmina Mcardle, MD  Assessment/Plan: Principal Problem:   Sickle cell crisis Active Problems:   Chronic pain  1. Hb SS with crisis: Pt on PCA since yesterday and states that his pain is better today. He has used 46.04 mg  mg with 80 demands and 67 deliveries within 24 hours. His  MRI shows no acute changes. Pt is functional and ambulating without difficulty. I will decrease his PCA and continue scheduled oral Dilaudid in anticipation of discharge tomorrow if he has no further fevers. 2. Anemia: Pt has anemia with a baseline Hb of 7. He was transfused 1 unit of blood on 12/26/2012 during his last admission to [facility]. He has shown no signs of hemolysis and doubt that he is having a late transfusion reaction as his bilirubin and LDH are not elevated above his baseline.Pt has severe antibodies and should not be transfused unless he has clinical need for transfusion and not because of a number. 3. Leukocytosis: At this point I believe that it is related to Prednisone use. He also has a chronically elevated WBC which is related to his SCD and noncompliance with Hydrea. He did have a fever last night without any obvious source. Will continue to monitor 4. Tachycardia: Pt has had a tachycardia without any evidence of cardiac pathtology. Will obtain TSH and cortisol in th am.  5. Hypotension/ Sepsis: In retrospect was likely due to GNR bacteremia. Now resolved. 6.  Gram Negative Bacteremia: ID is Burkholderia Picketti sensitivity shows sensitive to Cipro, Bactrim and Levaquin.Will treat with Levaquin 750 mg to complete a total of 14 days. Spoke with ID (Dr. Megan Salon) and they agree with continuing Levaquin for a total of 14 days. 7. Fever: Pt had  A fever of 101 last night. No obvious source. MRI of hips show no abscess and lungs clear.Urine shows no signs of infection. Will plan toD/C if clinically stable and  no further fevers. 8. Psychosocial: Pt has had much difficulty during this admission with suspicious behaviors surrounding his opiate analgesics. The patient admits to having difficulty with telling the truth as he wants to be endeared to his conversant. He admits to issues of trust and has fears of being stereotyped. I have spoken with patient at length on many occasions about the need for therapy to assist him in addressing his Psychosocial issues. He has refused in the past but now states that he would be willing to follow through on an outpatient basis. Dennis Bernard has misrepresented information and will often mischaracterize his pain to achieve secondary gain so that it is difficult to assess if the patient is actually having pain adversely impacting his function, or if he just wants to obtain medication. The patient also does not have a prescriber for his analgesics as he has burnt his bridges with multiple providers and thus he has much incentive for mis characterizing his pain as it is all subjective. I believe that Dennis. Bernard has again misrepresented his pain so that he would remain in the hospital until his appointment in [place] tomorrow. I formed this opinion when Dennis. Bernard today requested to be "discharged so I could get to my appointment tomorrow in [place] because Dr. Alene Mires said that he would write my pain medication".   Code Status: Full Code  Family Communication: N/A Disposition Plan: Not yet ready for discharge  Leana Gamer  Pager [phone]. If 7PM-7AM, please contact night-coverage.  01/11/2014, 5:31 PM  LOS: 10 days   Brief narrative: Dennis Bernard is a 28 y.o. male with istory of sickle cell anemia presents to the ED with a few days of pain and shortness of breath and sickle cell crisis. Patient has had pain in his chest as well as his shoulder. Patient states that he has noted the pain to be sharp and is typical of his sickle crisis. He states last admission  was in February sometime for this. He has been taking his pain medications regularly lately due to increase in his symptoms. No cough noted. No hemoptysis is noted. Has not had any syncope. He has noted some fevers though he is not entirely sure about this. He has no nausea and no vointing. Denies having any abdominal pain   Consultants:  None  Procedures:  None  Antibiotics:  None  HPI/Subjective: Pt states that pain in B/L hips improved. Pt able to ambulate today. He rates the pain as 6/10 , sharp and non-radiating  in nature.  Objective: Filed Vitals:   01/11/14 0943 01/11/14 1158 01/11/14 1430 01/11/14 1547  BP: 122/70  94/57   Pulse: 121  108   Temp: 98.5 F (36.9 C)  99 F (37.2 C)   TempSrc: Oral  Oral   Resp: 22 14 15 14   Height:      Weight:      SpO2: 100% 100% 100% 100%   Weight change:   Intake/Output Summary (Last 24 hours) at 01/11/14 1731 Last data filed at 01/11/14 0700  Gross per 24 hour  Intake   1551 ml  Output   2800 ml  Net  -1249 ml    General: Alert, awake, oriented x3, in no acute distress. Pt resting comfortably in bed. HEENT: [place]/AT PEERL, EOMI, anicteric Neck: Trachea midline,  no masses, no thyromegal,y no JVD, no carotid bruit OROPHARYNX:  Moist, No exudate/ erythema/lesions.  Heart: Regular rate and rhythm, without murmurs, rubs, gallops.  Lungs: Clear to auscultation, no wheezing or rhonchi noted.  Abdomen: Soft, nontender, nondistended, positive bowel sounds, no masses no hepatosplenomegaly noted.  Neuro: No focal neurological deficits noted cranial nerves II through XII grossly intact.  Strength normal in bilateral upper and lower extremities. Musculoskeletal: No warm swelling or erythema around joints, no spinal tenderness noted. Psychiatric: Patient alert and oriented x3, good insight and cognition, good recent to remote recall. Lymph node survey: No cervical axillary or inguinal lymphadenopathy noted.   Data Reviewed: Basic  Metabolic Panel:  Recent Labs Lab 01/05/14 0436 01/07/14 0611 01/09/14 0550 01/10/14 0345  NA 136* 140 140 139  K 4.9 4.6 4.4 4.0  CL 104 103 102 99  CO2 23 28 29 29   GLUCOSE 143* 117* 109* 97  BUN 13 14 16 18   CREATININE 0.79 0.70 0.72 0.76  CALCIUM 8.9 9.3 9.1 9.4   Liver Function Tests:  Recent Labs Lab 01/05/14 0436 01/07/14 0611 01/09/14 0550 01/10/14 0345  AST 22 12 15 25   ALT 24 18 22 20   ALKPHOS 92 84 88 103  BILITOT 1.3* 0.7 0.9 1.2  PROT 6.8 6.6 6.3 6.7  ALBUMIN 3.3* 3.1* 3.1* 3.4*   No results found for this basename: LIPASE, AMYLASE,  in the last 168 hours No results found for this basename: AMMONIA,  in the last 168 hours CBC:  Recent Labs Lab 01/07/14 0611 01/08/14 0605 01/09/14 0550 01/10/14 0345 01/11/14 0255  WBC 16.8* 17.1* 23.4* 20.5* 19.1*  NEUTROABS 11.1* 10.2* 17.8* 13.7* 14.3*  HGB 6.2* 6.2* 6.6* 6.6* 7.0*  HCT 18.8* 19.0* 20.3* 20.0* 21.4*  MCV 100.0 100.5* 101.5* 100.0 99.5  PLT 484* 530* 550* 540* 449*   Cardiac Enzymes: No results found for this basename: CKTOTAL, CKMB, CKMBINDEX, TROPONINI,  in the last 168 hours BNP (last 3 results)  Recent Labs  10/20/13 1935 10/22/13 0300  PROBNP 471.8* 160.5*   CBG: No results found for this basename: GLUCAP,  in the last 168 hours  Recent Results (from the past 240 hour(s))  URINE CULTURE     Status: None   Collection Time    01/02/14  1:43 AM      Result Value Ref Range Status   Specimen Description URINE, CLEAN CATCH   Final   Special Requests Normal   Final   Culture  Setup Time     Final   Value: 01/02/2014 08:38     Performed at [facility]     Final   Value: NO GROWTH     Performed at [facility]   Culture     Final   Value: NO GROWTH     Performed at [facility]   Report Status 01/03/2014 FINAL   Final  CULTURE, BLOOD (ROUTINE X 2)     Status: None   Collection Time    01/02/14  6:10 AM      Result Value Ref Range Status    Specimen Description BLOOD LEFT HAND   Final   Special Requests BOTTLES DRAWN AEROBIC ONLY 2CC   Final   Culture  Setup Time     Final   Value: 01/02/2014 08:36     Performed at [facility]   Culture     Final   Value: BURKHOLDERIA PICKETTII     Note: Gram Stain Report Called to,Read Back By and Verified With: [place] ON 4.24.15 @ 5:30pM BY [place]     Performed at [facility]   Report Status 01/10/2014 FINAL   Final   Organism ID, Bacteria BURKHOLDERIA PICKETTII   Final  CULTURE, BLOOD (ROUTINE X 2)     Status: None   Collection Time    01/02/14  6:20 AM      Result Value Ref Range Status   Specimen Description BLOOD LEFT HAND   Final   Special Requests BOTTLES DRAWN AEROBIC ONLY 2CC   Final   Culture  Setup Time     Final   Value: 01/02/2014 08:36     Performed at [facility]   Culture     Final   Value: NO GROWTH 5 DAYS     Performed at [facility]   Report Status 01/08/2014 FINAL   Final     Studies: Dg Chest 2 View  (if Recent History Of Cough Or Chest Pain)  01/01/2014   CLINICAL DATA:  Chest tightness, cough and fever  EXAM: CHEST  2 VIEW  COMPARISON:  Prior chest x-ray 11/06/2013  FINDINGS: Stable cardiac and mediastinal contours. Very low inspiratory volumes. Similar appearance of mild diffuse interstitial prominence. The central bronchial wall thickening similar compared to prior. No focal airspace consolidation. No pleural effusion or pneumothorax. Surgical changes of prior left shoulder arthroplasty. No acute osseous abnormality.  IMPRESSION: Stable chest x-ray without evidence of acute cardiopulmonary disease.   Electronically Signed   By: Jacqulynn Cadet M.D.   On: 01/01/2014 18:37    Scheduled Meds: . amitriptyline  25 mg Oral QHS  .  folic acid  1 mg Oral q morning - 10a  . heparin  5,000 Units Subcutaneous 3 times per day  . HYDROmorphone PCA 0.3 mg/mL   Intravenous 6 times per day  . HYDROmorphone  4 mg Oral Q4H  . levofloxacin   750 mg Oral Daily  . morphine  60 mg Oral 3 times per day  . multivitamin with minerals  1 tablet Oral Daily  . pantoprazole  40 mg Oral Daily  . senna-docusate  1 tablet Oral BID  . sodium chloride  10-40 mL Intracatheter Q12H   Continuous Infusions:    Total time spent 33 minutes

## 2014-01-12 ENCOUNTER — Inpatient Hospital Stay (HOSPITAL_COMMUNITY): Payer: Medicare Other

## 2014-01-12 DIAGNOSIS — R5381 Other malaise: Secondary | ICD-10-CM

## 2014-01-12 DIAGNOSIS — R0789 Other chest pain: Secondary | ICD-10-CM

## 2014-01-12 DIAGNOSIS — R5383 Other fatigue: Secondary | ICD-10-CM

## 2014-01-12 DIAGNOSIS — G8929 Other chronic pain: Secondary | ICD-10-CM

## 2014-01-12 LAB — URINE CULTURE
CULTURE: NO GROWTH
Colony Count: NO GROWTH

## 2014-01-12 LAB — TSH: TSH: 1.58 u[IU]/mL (ref 0.350–4.500)

## 2014-01-12 MED ORDER — HYDROXYUREA 500 MG PO CAPS
2500.0000 mg | ORAL_CAPSULE | Freq: Every day | ORAL | Status: DC
  Administered 2014-01-12 – 2014-01-14 (×3): 2500 mg via ORAL
  Filled 2014-01-12 (×5): qty 5

## 2014-01-12 MED ORDER — IOHEXOL 350 MG/ML SOLN
100.0000 mL | Freq: Once | INTRAVENOUS | Status: AC | PRN
  Administered 2014-01-12: 100 mL via INTRAVENOUS

## 2014-01-12 MED ORDER — LEVOFLOXACIN IN D5W 750 MG/150ML IV SOLN
750.0000 mg | INTRAVENOUS | Status: DC
  Filled 2014-01-12: qty 150

## 2014-01-12 MED ORDER — VANCOMYCIN HCL IN DEXTROSE 1-5 GM/200ML-% IV SOLN
1000.0000 mg | Freq: Three times a day (TID) | INTRAVENOUS | Status: AC
  Administered 2014-01-12 – 2014-01-14 (×8): 1000 mg via INTRAVENOUS
  Filled 2014-01-12 (×8): qty 200

## 2014-01-12 MED ORDER — DEXTROSE-NACL 5-0.45 % IV SOLN
INTRAVENOUS | Status: DC
  Administered 2014-01-12 – 2014-01-13 (×2): via INTRAVENOUS

## 2014-01-12 MED ORDER — LEVOFLOXACIN IN D5W 750 MG/150ML IV SOLN: 750.0000 mg | INTRAVENOUS | Status: DC

## 2014-01-12 MED ORDER — DEXTROSE 5 % AND 0.45 % NACL IV BOLUS
250.0000 mL | Freq: Once | INTRAVENOUS | Status: AC
  Administered 2014-01-12: 250 mL via INTRAVENOUS

## 2014-01-12 MED ORDER — PIPERACILLIN-TAZOBACTAM 3.375 G IVPB
3.3750 g | Freq: Three times a day (TID) | INTRAVENOUS | Status: AC
  Administered 2014-01-12 – 2014-01-14 (×8): 3.375 g via INTRAVENOUS
  Filled 2014-01-12 (×8): qty 50

## 2014-01-12 MED ORDER — ENSURE COMPLETE PO LIQD
237.0000 mL | Freq: Three times a day (TID) | ORAL | Status: DC
  Administered 2014-01-12 – 2014-01-18 (×17): 237 mL via ORAL

## 2014-01-12 NOTE — Progress Notes (Signed)
Patient had his right sided port a catheter removed 3 weeks ago at another facility secondary to infection. IR has received request for suture removal. Port site without erythema, surgical edges healed together without discharge or evidence of infection. Successful removal of (2) sutures, sterile dressing applied. No immediate complications. Patient was instructed on signs and symptoms of infection to look for around port site.  Tsosie Billing PA-C Interventional Radiology  01/12/14  1:51 PM

## 2014-01-12 NOTE — Progress Notes (Signed)
INITIAL NUTRITION ASSESSMENT  DOCUMENTATION CODES Per approved criteria  -Not Applicable   INTERVENTION: - Ensure Complete TID - Encouraged increased meal intake - Will continue to monitor   NUTRITION DIAGNOSIS: Inadequate oral intake related to poor appetite as evidenced by pt report.   Goal: Pt to consume >90% of meals/supplements  Monitor:  Weights, labs, intake  Reason for Assessment: Poor intake, length of stay  28 y.o. male  Admitting Dx: Sickle cell crisis  ASSESSMENT: Pt with history of sickle cell anemia presents to the ED with a few days of pain and shortness of breath and sickle cell crisis.   Met with pt who reports his appetite has been down for the past week with pt consuming only 1 meal/day of things like chicken nuggets and fries. Denies any changes in weight. Did not order anything to eat today. Interested in getting Ensure Complete during admission.    Height: Ht Readings from Last 1 Encounters:  01/12/14 _0  (1.753 m)    Weight: Wt Readings from Last 1 Encounters:  01/12/14 169 lb 1.6 oz (76.703 kg)    Ideal Body Weight: 160 lbs   % Ideal Body Weight: 106%  Wt Readings from Last 10 Encounters:  01/12/14 169 lb 1.6 oz (76.703 kg)  11/07/13 171 lb 4.8 oz (77.7 kg)  10/25/13 172 lb 8 oz (78.245 kg)  09/24/13 176 lb 11.2 oz (80.151 kg)  09/14/13 181 lb 9.6 oz (82.373 kg)  08/28/13 170 lb 13.7 oz (77.5 kg)  07/29/13 184 lb 8 oz (83.689 kg)  05/28/13 168 lb 14 oz (76.6 kg)  05/14/13 170 lb (77.111 kg)  03/27/13 175 lb 12.8 oz (79.742 kg)    Usual Body Weight: 169 lbs per pt  % Usual Body Weight: 100%  BMI:  Body mass index is 24.96 kg/(m^2).  Estimated Nutritional Needs: Kcal: 1950-2150 Protein: 90-110g Fluid: 1.9-2.1L/day   Skin: +1 RLE, LLE edema  Diet Order: General  EDUCATION NEEDS: -No education needs identified at this time   Intake/Output Summary (Last 24 hours) at 01/12/14 1411 Last data filed at 01/12/14 1025  Gross per 24 hour  Intake    120 ml  Output   1675 ml  Net  -1555 ml    Last BM: 4/26  Labs:   Recent Labs Lab 01/07/14 0611 01/09/14 0550 01/10/14 0345  NA 140 140 139  K 4.6 4.4 4.0  CL 103 102 99  CO2 _1 BUN _2 CREATININE 0.70 0.72 0.76  CALCIUM 9.3 9.1 9.4  GLUCOSE 117* 109* 97    CBG (last 3)  No results found for this basename: GLUCAP,  in the last 72 hours  Scheduled Meds: . amitriptyline  25 mg Oral QHS  . dextrose 5 % and 0.45% NaCl  250 mL Intravenous Once  . folic acid  1 mg Oral q morning - 10a  . heparin  5,000 Units Subcutaneous 3 times per day  . HYDROmorphone PCA 0.3 mg/mL   Intravenous 6 times per day  . HYDROmorphone  4 mg Oral Q4H  . morphine  60 mg Oral 3 times per day  . multivitamin with minerals  1 tablet Oral Daily  . pantoprazole  40 mg Oral Daily  . piperacillin-tazobactam (ZOSYN)  IV  3.375 g Intravenous 3 times per day  . senna-docusate  1 tablet Oral BID  . sodium chloride  10-40 mL Intracatheter Q12H  . vancomycin  1,000 mg Intravenous 3 times per day  Continuous Infusions:   Past Medical History  Diagnosis Date  . Sickle cell disease   . Avascular necrosis of femur head, right   . H/O allergic rhinitis   . H/O hypokalemia   . Chronic pain syndrome   . H/O wheezing     with colds  . 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 Cholecystectomy  1995  . Portacath placement Left 07/04/2012  . Exchange transfusion  02/2008    perioperatively for shoulder surgery  . 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 Portacath placement Right November 2014    Dennis Bernard [place], Dennis Bernard, [place] Pager [phone] After Hours Pager

## 2014-01-12 NOTE — Progress Notes (Signed)
PHYSICIAN PROGRESS NOTE  Dennis Bernard IWP:[id number] DOB: 26-Nov-1985 DOA: 01/01/2014  01/12/2014   PCP: Dennis Mcardle, MD  Admission History: Dennis Bernard is a 28 y.o. male with istory of sickle cell anemia presents to the ED with a few days of pain and shortness of breath and sickle cell crisis. Patient has had pain in his chest as well as his shoulder. Patient states that he has noted the pain to be sharp and is typical of his sickle crisis. He states last admission was in February sometime for this. He has been taking his pain medications regularly lately due to increase in his symptoms. No cough noted. No hemoptysis is noted. Has not had any syncope. He has noted some fevers though he is not entirely sure about this. He has no nausea and no vomiting. Denies having any abdominal pain.   Assessment/Plan: 1. Hb SS with crisis: Pt reports that he is still having significant pain requiring PCA treatment. He has used 36.74 mg with 86 demands and 77 deliveries within 24 hours. His MRI shows no acute changes. Pt is functional and ambulating but has generalized malaise. He is reporting shortness of breath and sharp pain in left chest wall with deep breaths. Given persistent fever and hypoxia, I am concerned about ACS and nosocomial lung infection, will check CT chest today.   2. Anemia: Pt has anemia with a baseline Hb of 7. He was transfused 1 unit of blood on 12/26/2012 during his last admission to [facility]. He has shown no signs of hemolysis and doubt that he is having a late transfusion reaction as his bilirubin and LDH are not elevated above his baseline.Pt has severe antibodies and should not be transfused unless he has clinical need for transfusion and not because of a number.  3. Hypoxia - Pulse ox dropped to 76% off oxygen. Continue supplemental oxygen and CT chest ordered.   4. Leukocytosis: Given persistent fever and WBC elevation, will investigate further for signs of infection, repeat UA neg,  Checking CT chest, repeated blood culture today,  He did have a fever last night without any obvious source.  Will change antibiotics to include zosyn/vanc IV. Repeat blood culture ordered.   5. Sinus Tachycardia:Likely related to underlying infection, will investigate further and broaden antibiotic coverage. Will obtain TSH and cortisol. EKG reveals sinus rhythm. Transfer to telemetry.   6. Hypotension/ Sepsis: In retrospect was likely due to GNR bacteremia versus ACS.  Lactic acid ok.  Following.   7. 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 Repeated blood culture today.  8. Fever: Pt having persistent fever and tachycardia.  Further evaluation ordered this morning.  CT chest ordered.  Blood culture ordered.  MRI of hips show no abscess. Urine shows no signs of infection.  9. Psychosocial: Pt has had much difficulty during this admission with suspicious behaviors surrounding his opiate analgesics. The patient admits to having difficulty with telling the truth as he wants to be endeared to his conversant. He admits to issues of trust and has fears of being stereotyped. I have spoken with patient at length on many occasions about the need for therapy to assist him in addressing his Psychosocial issues. He has refused in the past but now states that he would be willing to follow through on an outpatient basis. Dennis Bernard has misrepresented information and will often mischaracterize his pain to achieve secondary gain so that it is difficult to assess if  the patient is actually having pain adversely impacting his function, or if he just wants to obtain medication. The patient also does not have a prescriber for his analgesics as he has burnt his bridges with multiple providers and thus he has much incentive for mis characterizing his pain as it is all subjective.  Code Status: Full Code  Family Communication: N/A  Disposition Plan: Not  yet ready for discharge  HPI/Subjective: Pt reports that he is having some shortness of breath and a sharp pain on left chest wall with deep breaths.  He says that his pain has not been well controlled.  He denies frequent coughing.  He denies abdominal pain.  He says that he has chest pain with certain positional movements.  He is not eating or drinking very well.  He reports his last BM was yesterday.  He says that he is feeling his heartbeat with sensations of palpatations.    Objective: Filed Vitals:   01/12/14 1025  BP: 104/65  Pulse: 135  Temp: 101.8 F (38.8 C)  Resp: 19    Intake/Output Summary (Last 24 hours) at 01/12/14 1045 Last data filed at 01/12/14 1025  Gross per 24 hour  Intake    120 ml  Output   1675 ml  Net  -1555 ml   Filed Weights   01/04/14 0600 01/08/14 0551 01/09/14 0601  Weight: 186 lb 4.6 oz (84.5 kg) 185 lb 10 oz (84.2 kg) 185 lb 6.4 oz (84.097 kg)     Review Of Systems: As detailed above, otherwise all reviewed and negative  Exam:  General: Alert, awake, oriented x3, in no acute distress, but appears weak and chronically ill.  Pt resting comfortably in bed.  HEENT: [place]/AT PEERL, EOMI, anicteric  Neck: Trachea midline, no masses, no thyromegal,y no JVD, no carotid bruit  OROPHARYNX: Dry Mucous Membranes, No exudate/ erythema/lesions.  Heart: tachycardic (130s) without murmurs, rubs, gallops.  Lungs: shallow but clear Abdomen: Soft, nontender, nondistended, positive bowel sounds, no masses no hepatosplenomegaly noted.  Neuro: No focal neurological deficits noted cranial nerves grossly intact. Strength normal in bilateral upper and lower extremities.  Musculoskeletal: No warm swelling or erythema around joints, no spinal tenderness noted.  Psychiatric: Patient alert and oriented x3, good insight and cognition, good recent to remote recall.  Lymph node survey: No cervical axillary or inguinal lymphadenopathy noted.  Data Reviewed: Basic Metabolic  Panel:  Recent Labs Lab 01/07/14 0611 01/09/14 0550 01/10/14 0345  NA 140 140 139  K 4.6 4.4 4.0  CL 103 102 99  CO2 28 29 29   GLUCOSE 117* 109* 97  BUN 14 16 18   CREATININE 0.70 0.72 0.76  CALCIUM 9.3 9.1 9.4   Liver Function Tests:  Recent Labs Lab 01/07/14 0611 01/09/14 0550 01/10/14 0345  AST 12 15 25   ALT 18 22 20   ALKPHOS 84 88 103  BILITOT 0.7 0.9 1.2  PROT 6.6 6.3 6.7  ALBUMIN 3.1* 3.1* 3.4*   No results found for this basename: LIPASE, AMYLASE,  in the last 168 hours No results found for this basename: AMMONIA,  in the last 168 hours CBC:  Recent Labs Lab 01/07/14 0611 01/08/14 0605 01/09/14 0550 01/10/14 0345 01/11/14 0255  WBC 16.8* 17.1* 23.4* 20.5* 19.1*  NEUTROABS 11.1* 10.2* 17.8* 13.7* 14.3*  HGB 6.2* 6.2* 6.6* 6.6* 7.0*  HCT 18.8* 19.0* 20.3* 20.0* 21.4*  MCV 100.0 100.5* 101.5* 100.0 99.5  PLT 484* 530* 550* 540* 449*   Cardiac Enzymes: No results found  for this basename: CKTOTAL, CKMB, CKMBINDEX, TROPONINI,  in the last 168 hours BNP (last 3 results)  Recent Labs  10/20/13 1935 10/22/13 0300  PROBNP 471.8* 160.5*   CBG: No results found for this basename: GLUCAP,  in the last 168 hours  No results found for this or any previous visit (from the past 240 hour(s)).   Studies: Dennis Femur Right Wo Contrast  01/10/2014   CLINICAL DATA:  Evaluate AVN vs acute infarct in patient with sickle cell disease ; evaluate AVN of B/L hips vs acute infarcts  EXAM: MRI OF THE RIGHT FEMUR WITHOUT CONTRAST; Dennis OF THE LEFT FEMUR WITHOUT CONTRAST  TECHNIQUE: Multiplanar, multisequence Dennis imaging was performed. No intravenous contrast was administered.  COMPARISON:  Radiographs today, 01/09/2014 and 08/26/2012.  FINDINGS: The study was ordered with and without contrast. Patient could not continue to do the post-contrast portion of the study and the pre contrast images are degraded by motion artifact. There is bilateral femoral head AVN, worse on the right  than left.  Subchondral collapse is present in the right femoral head with minimal bone marrow edema compatible with chronic AVN. The left-sided AVN is poorly visualized. No definite subchondral collapse of the left femoral head and the area of AVN appears small. Although there is some subchondral collapse of the right femoral head, the amount of secondary degenerative changes are mild and poorly visualized because of motion artifact. Tiny amount of subchondral marrow edema is present in the right acetabular roof. There is no left-sided marrow edema.  The left femur demonstrates a medullary infarct in the mid diaphysis, without radiating bone marrow edema compatible with a chronic bone infarct. No right-sided femoral medullary infarct is identified. Visualize pelvic bones appear within normal limits. Visceral pelvis also appears within normal limits. Common hamstring origins normal. There is a small right hip effusion and a tiny left hip effusion. There suppression of normal fatty marrow, compatible with the history chronic sickle cell anemia.  Muscular compartments appear within normal limits. No evidence of pyomyositis.  IMPRESSION: 1. 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 There is no radiating bone marrow to suggest acute on chronic injury. 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 2. Study is mild to moderately degraded by motion artifact. 3. Chronic left femoral diaphysis medullary infarct. No acute infarcts identified.   Electronically Signed   By: Dereck Ligas M.D.   On: 01/10/2014 17:29   Dennis Femur Left Wo Contrast  01/10/2014   CLINICAL DATA:  Evaluate AVN vs acute infarct in patient with sickle cell disease ; evaluate AVN of B/L hips vs acute infarcts  EXAM: MRI OF THE RIGHT FEMUR WITHOUT CONTRAST; Dennis OF THE LEFT FEMUR WITHOUT CONTRAST  TECHNIQUE: Multiplanar, multisequence Dennis imaging was performed. No intravenous contrast was administered.  COMPARISON:  Radiographs today,  01/09/2014 and 08/26/2012.  FINDINGS: The study was ordered with and without contrast. Patient could not continue to do the post-contrast portion of the study and the pre contrast images are degraded by motion artifact. There is bilateral femoral head AVN, worse on the right than left.  Subchondral collapse is present in the right femoral head with minimal bone marrow edema compatible with chronic AVN. The left-sided AVN is poorly visualized. No definite subchondral collapse of the left femoral head and the area of AVN appears small. Although there is some subchondral collapse of the right femoral head, the amount of secondary degenerative changes are mild and poorly visualized because of motion artifact.  Tiny amount of subchondral marrow edema is present in the right acetabular roof. There is no left-sided marrow edema.  The left femur demonstrates a medullary infarct in the mid diaphysis, without radiating bone marrow edema compatible with a chronic bone infarct. No right-sided femoral medullary infarct is identified. Visualize pelvic bones appear within normal limits. Visceral pelvis also appears within normal limits. Common hamstring origins normal. There is a small right hip effusion and a tiny left hip effusion. There suppression of normal fatty marrow, compatible with the history chronic sickle cell anemia.  Muscular compartments appear within normal limits. No evidence of pyomyositis.  IMPRESSION: 1. 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 There is no radiating bone marrow to suggest acute on chronic injury. 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 2. Study is mild to moderately degraded by motion artifact. 3. Chronic left femoral diaphysis medullary infarct. No acute infarcts identified.   Electronically Signed   By: Dereck Ligas M.D.   On: 01/10/2014 17:29   Dg Chest Port 1 View  01/10/2014   CLINICAL DATA:  Chest pain, fever, and tachycardia with history of sickle cell  disease  EXAM: PORTABLE CHEST - 1 VIEW  COMPARISON:  DG CHEST 2 VIEW dated 01/01/2014  FINDINGS: The lungs are mildly hypoinflated. There is a PICC catheter in place on the of the right upper extremity with the catheter in the region of the midportion of the SVC. The pulmonary interstitial markings are mildly increased. The cardiopericardial silhouette is enlarged. The trachea is midline. There is no pleural effusion.  IMPRESSION: Mildly increased pulmonary interstitial markings suggesting interstitial edema likely of cardiac cause. There is no alveolar pneumonia nor pleural effusion.   Electronically Signed   By: David  [place]ique   On: 01/10/2014 20:22    Scheduled Meds: . amitriptyline  25 mg Oral QHS  . folic acid  1 mg Oral q morning - 10a  . heparin  5,000 Units Subcutaneous 3 times per day  . HYDROmorphone PCA 0.3 mg/mL   Intravenous 6 times per day  . HYDROmorphone  4 mg Oral Q4H  . [START ON 01/13/2014] levofloxacin (LEVAQUIN) IV  750 mg Intravenous Q24H  . morphine  60 mg Oral 3 times per day  . multivitamin with minerals  1 tablet Oral Daily  . pantoprazole  40 mg Oral Daily  . senna-docusate  1 tablet Oral BID  . sodium chloride  10-40 mL Intracatheter Q12H   Continuous Infusions:   Principal Problem:   Sickle cell crisis Active Problems:   Chronic pain  Clanford L Johnson  [facility] Pager [phone].  If 7PM-7AM, please contact night-coverage at [email or web address], password [facility] 01/12/2014, 10:45 AM  LOS: 11 days

## 2014-01-12 NOTE — Progress Notes (Signed)
ANTIBIOTIC CONSULT NOTE - Initial  Pharmacy Consult for Zosyn and Vancomycin Indication: r/o sepsis  No Known Allergies  Patient Measurements: Height: 5\' 9"  (175.3 cm) Weight: 185 lb 6.4 oz (84.097 kg) IBW/kg (Calculated) : 70.7  Vital Signs: Temp: 101.8 F (38.8 C) (05/01 1025) Temp src: Oral (05/01 1025) BP: 104/65 mmHg (05/01 1025) Pulse Rate: 135 (05/01 1025) Intake/Output from previous day: 04/30 0701 - 05/01 0700 In: 120 [P.O.:120] Out: 1325 [Urine:1325] Intake/Output from this shift: Total I/O In: -  Out: 350 [Urine:350]  Labs:  Recent Labs  01/10/14 0345 01/11/14 0255  WBC 20.5* 19.1*  HGB 6.6* 7.0*  PLT 540* 449*  CREATININE 0.76  --    Estimated Creatinine Clearance: 136.2 ml/min (by C-G formula based on Cr of 0.76). No results found for this basename: VANCOTROUGH, Corlis Leak, VANCORANDOM, [place], [place], [place], [place], [place], TOBRARND, AMIKACINPEAK, AMIKACINTROU, AMIKACIN,  in the last 72 hours   Microbiology: Recent Results (from the past 720 hour(s))  URINE CULTURE     Status: None   Collection Time    01/02/14  1:43 AM      Result Value Ref Range Status   Specimen Description URINE, CLEAN CATCH   Final   Special Requests Normal   Final   Culture  Setup Time     Final   Value: 01/02/2014 08:38     Performed at [facility]     Final   Value: NO GROWTH     Performed at [facility]   Culture     Final   Value: NO GROWTH     Performed at [facility]   Report Status 01/03/2014 FINAL   Final  CULTURE, BLOOD (ROUTINE X 2)     Status: None   Collection Time    01/02/14  6:10 AM      Result Value Ref Range Status   Specimen Description BLOOD LEFT HAND   Final   Special Requests BOTTLES DRAWN AEROBIC ONLY [place]   Final   Culture  Setup Time     Final   Value: 01/02/2014 08:36     Performed at [facility]   Culture     Final   Value: BURKHOLDERIA PICKETTII     Note: Gram Stain  Report Called to,Read Back By and Verified With: [place] ON 4.24.15 @ 5:30pM BY [place]     Performed at [facility]   Report Status 01/10/2014 FINAL   Final   Organism ID, Bacteria BURKHOLDERIA PICKETTII   Final  CULTURE, BLOOD (ROUTINE X 2)     Status: None   Collection Time    01/02/14  6:20 AM      Result Value Ref Range Status   Specimen Description BLOOD LEFT HAND   Final   Special Requests BOTTLES DRAWN AEROBIC ONLY [place]   Final   Culture  Setup Time     Final   Value: 01/02/2014 08:36     Performed at [facility]   Culture     Final   Value: NO GROWTH 5 DAYS     Performed at [facility]   Report Status 01/08/2014 FINAL   Final    Medical History: Past Medical History  Diagnosis Date  . Sickle cell disease   . Avascular necrosis of femur head, right   . H/O allergic rhinitis   . H/O hypokalemia   . Chronic pain syndrome   . H/O wheezing  with colds  . Non-ABO incompatible blood transfusion 05/30/2013    Assessment: 19 yoM admitted 4/20 for sickle cell crisis, noted to be possibly abusing IV pain medications during this admission. 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 5/1: patient continues to have fevers, low blood pressure and tachycardia. Pharmacy is asked to switch abx to Zosyn and Vanc for broader-coverage.   4/24 PM >> Cefepime >> 4/28 4/29 >> LVQ (MD) >> 5/1 5/1 >> Zosyn >> 5/1 >> Vanc >>  Tmax: spike, 101.8 WBCs: elevated. 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  Zosyn 3.375g IV Q8H infused over 4hrs.  Measure Vanc trough at steady state.  Follow up renal  fxn and culture results.  Romeo Rabon, PharmD, pager [phone]. 01/12/2014,11:06 AM.

## 2014-01-13 DIAGNOSIS — E871 Hypo-osmolality and hyponatremia: Secondary | ICD-10-CM | POA: Diagnosis not present

## 2014-01-13 LAB — COMPREHENSIVE METABOLIC PANEL
ALT: 23 U/L (ref 0–53)
AST: 23 U/L (ref 0–37)
Albumin: 2.9 g/dL — ABNORMAL LOW (ref 3.5–5.2)
Alkaline Phosphatase: 147 U/L — ABNORMAL HIGH (ref 39–117)
BILIRUBIN TOTAL: 1.8 mg/dL — AB (ref 0.3–1.2)
BUN: 19 mg/dL (ref 6–23)
CO2: 27 meq/L (ref 19–32)
Calcium: 9.1 mg/dL (ref 8.4–10.5)
Chloride: 97 mEq/L (ref 96–112)
Creatinine, Ser: 0.86 mg/dL (ref 0.50–1.35)
GFR calc Af Amer: >90 mL/min (ref >90)
GLUCOSE: 120 mg/dL — AB (ref 70–99)
Potassium: 4 mEq/L (ref 3.7–5.3)
Sodium: 135 mEq/L — ABNORMAL LOW (ref 137–147)
Total Protein: 6.7 g/dL (ref 6.0–8.3)

## 2014-01-13 LAB — CORTISOL-AM, BLOOD: CORTISOL - AM: 22.4 ug/dL (ref 4.3–22.4)

## 2014-01-13 LAB — CBC
HCT: 19 % — ABNORMAL LOW (ref 39.0–52.0)
Hemoglobin: 6.3 g/dL — CL (ref 13.0–17.0)
MCH: 31.5 pg (ref 26.0–34.0)
MCHC: 33.2 g/dL (ref 30.0–36.0)
MCV: 95 fL (ref 78.0–100.0)
PLATELETS: 281 10*3/uL (ref 150–400)
RBC: 2 MIL/uL — ABNORMAL LOW (ref 4.22–5.81)
RDW: 18.1 % — AB (ref 11.5–15.5)
WBC: 15.4 10*3/uL — ABNORMAL HIGH (ref 4.0–10.5)

## 2014-01-13 LAB — LACTIC ACID, PLASMA: Lactic Acid, Venous: 0.8 mmol/L (ref 0.5–2.2)

## 2014-01-13 MED ORDER — SODIUM CHLORIDE 0.9 % IV SOLN
INTRAVENOUS | Status: DC
  Administered 2014-01-13 – 2014-01-19 (×4): via INTRAVENOUS

## 2014-01-13 MED ORDER — HYDROMORPHONE HCL PF 1 MG/ML IJ SOLN
1.0000 mg | INTRAMUSCULAR | Status: DC | PRN
  Administered 2014-01-13 – 2014-01-14 (×3): 1 mg via INTRAVENOUS
  Filled 2014-01-13 (×3): qty 1

## 2014-01-13 NOTE — Progress Notes (Signed)
CRITICAL VALUE ALERT  Critical value received:  6.3  Date of notification:  01/13/14  Time of notification:  5993  Critical value read back:yes  Nurse who received alert:  Philemon Kingdom :) RN  MD notified (1st page):  Jonette Eva PA   Time of first page:  0533  MD notified (2nd page):  Time of second page:  Responding MD:  Jonette Eva   Time MD responded:  [phone]   Will continue to monitor patient.

## 2014-01-13 NOTE — Progress Notes (Signed)
PT Cancellation Note  Patient Details Name: Dennis Bernard MRN: [id number] DOB: 10/05/1985   Cancelled Treatment:    Reason Eval/Treat Not Completed: Medical issues which prohibited therapy (low HGB)   [place] PT [phone]  01/13/2014, 8:34 AM

## 2014-01-13 NOTE — Progress Notes (Signed)
PHYSICIAN PROGRESS NOTE  Dennis Bernard ZHY:[id number] DOB: [birth year] DOA: 01/01/2014  01/13/2014   PCP: Wilhelmina Mcardle, MD  Admission History: Dennis Bernard is a 28 y.o. male with istory of sickle cell anemia presents to the ED with a few days of pain and shortness of breath and sickle cell crisis. Patient has had pain in his chest as well as his shoulder. Patient states that he has noted the pain to be sharp and is typical of his sickle crisis. He states last admission was in February sometime for this. He has been taking his pain medications regularly lately due to increase in his symptoms. No cough noted. No hemoptysis is noted. Has not had any syncope. He has noted some fevers though he is not entirely sure about this. He has no nausea and no vomiting. Denies having any abdominal pain.   Assessment/Plan: 1. Hb SS with crisis: Pt reports that he is still having significant pain requiring PCA treatment. He has used 21.34 mg with 55 demands and 54 deliveries within 24 hours. Pt. reports that he has been ambulating in the room.  Will continue reduced dose PCA today.  Continue his long-acting pain medication and adjunctive therapies.  2. Anemia: Pt has anemia with a baseline Hb of 7. He was transfused 1 unit of blood on 12/26/2012 during his last admission to [facility]. He has shown no signs of hemolysis and doubt that he is having a late transfusion reaction as his bilirubin and LDH are not elevated above his baseline. Pt has severe antibodies and should not be transfused unless he has clinical need for transfusion and not because of a number.  3. Hypoxia - Likely related to atelectasis as shown in CT chest.  No PE or pneumonia seen.  Continue supplemental oxygen and encouraged incentive spirometry.  4. Leukocytosis: slightly improved.  No clear sign of infection found, repeat BC pending.  Continue zosyn/vanc IV for empiric coverage pending blood culture result.   5. Sinus Tachycardia: Likely related  to underlying infection, continue broad antibiotic coverage today. TSH and cortisol normal. EKG reveals sinus rhythm.  6. Hypotension/ Sepsis: In retrospect was likely due to GNR bacteremia versus ACS. Lactic acid ok. Following.  7. 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 Repeated blood culture results pending.  8. Fever: Temp trending down.  Following.  Further evaluation ordered this morning. CT chest reveals atelectasis which can cause fever. No pneumonia seen. Blood culture pending. MRI of hips show no abscess. Urine shows no signs of infection.   9. Constipation - Miralax BID will be ordered.  Last BM 3 days ago.  Prn laxatives ordered as well. 10. Hyponatremia - likely from IVFs, will switch to NS, decrease rate of IVFs. Follow.   11. Psychosocial: Pt has had much difficulty during this admission with suspicious behaviors surrounding his opiate analgesics. The patient admits to having difficulty with telling the truth as he wants to be endeared to his conversant. He admits to issues of trust and has fears of being stereotyped. I have spoken with patient at length on many occasions about the need for therapy to assist him in addressing his Psychosocial issues. He has refused in the past but now states that he would be willing to follow through on an outpatient basis. Dennis Bernard has misrepresented information and will often mischaracterize his pain to achieve secondary gain so that it is difficult to assess if the patient is actually having pain adversely impacting his function, or  if he just wants to obtain medication. The patient also does not have a prescriber for his analgesics as he has burnt his bridges with multiple providers and thus he has much incentive for mis characterizing his pain as it is all subjective.  Code Status: Full Code  Family Communication: N/A  Disposition Plan: Not yet ready for discharge  HPI/Subjective: Pt reports that he is having  persistent pain but has been eating better and ambulating in room. He denies SOB but has remained on oxygen.  He says that he has been constipated and no bowel movement in last 3 days.  He says that miralax BID has worked well for him in the past.    Objective: Filed Vitals:   01/13/14 0753  BP:   Pulse:   Temp:   Resp: 17    Intake/Output Summary (Last 24 hours) at 01/13/14 0903 Last data filed at 01/13/14 0500  Gross per 24 hour  Intake 1822.5 ml  Output   2075 ml  Net -252.5 ml   Filed Weights   01/08/14 0551 01/09/14 0601 01/12/14 1124  Weight: 185 lb 10 oz (84.2 kg) 185 lb 6.4 oz (84.097 kg) 169 lb 1.6 oz (76.703 kg)    Review Of Systems: As detailed above, otherwise all reviewed and negative  Exam:  General: Alert, awake, oriented x3, in no acute distress, but appears weak and chronically ill. Pt resting comfortably in bed.  HEENT: [place]/AT PEERL, EOMI, anicteric  Neck: Trachea midline, no masses, no thyromegal,y no JVD, no carotid bruit  OROPHARYNX: Dry Mucous Membranes, No exudate/ erythema/lesions.  Heart: tachycardic without murmurs, rubs, gallops.  Lungs: shallow but clear  Abdomen: Soft, nontender, nondistended, positive bowel sounds, no masses no hepatosplenomegaly noted.  Neuro: No focal neurological deficits noted cranial nerves grossly intact. Strength normal in bilateral upper and lower extremities.  Musculoskeletal: No warm swelling or erythema around joints, no spinal tenderness noted.  Psychiatric: Patient alert and oriented x3, good insight and cognition, good recent to remote recall.  Lymph node survey: No cervical axillary or inguinal lymphadenopathy noted.  Data Reviewed: Basic Metabolic Panel:  Recent Labs Lab 01/07/14 0611 01/09/14 0550 01/10/14 0345 01/13/14 0505  NA 140 140 139 135*  K 4.6 4.4 4.0 4.0  CL 103 102 99 97  CO2 28 29 29 27   GLUCOSE 117* 109* 97 120*  BUN 14 16 18 19   CREATININE 0.70 0.72 0.76 0.86  CALCIUM 9.3 9.1 9.4 9.1    Liver Function Tests:  Recent Labs Lab 01/07/14 0611 01/09/14 0550 01/10/14 0345 01/13/14 0505  AST 12 15 25 23   ALT 18 22 20 23   ALKPHOS 84 88 103 147*  BILITOT 0.7 0.9 1.2 1.8*  PROT 6.6 6.3 6.7 6.7  ALBUMIN 3.1* 3.1* 3.4* 2.9*   No results found for this basename: LIPASE, AMYLASE,  in the last 168 hours No results found for this basename: AMMONIA,  in the last 168 hours CBC:  Recent Labs Lab 01/07/14 0611 01/08/14 0605 01/09/14 0550 01/10/14 0345 01/11/14 0255 01/13/14 0505  WBC 16.8* 17.1* 23.4* 20.5* 19.1* 15.4*  NEUTROABS 11.1* 10.2* 17.8* 13.7* 14.3*  --   HGB 6.2* 6.2* 6.6* 6.6* 7.0* 6.3*  HCT 18.8* 19.0* 20.3* 20.0* 21.4* 19.0*  MCV 100.0 100.5* 101.5* 100.0 99.5 95.0  PLT 484* 530* 550* 540* 449* 281   Cardiac Enzymes: No results found for this basename: CKTOTAL, CKMB, CKMBINDEX, TROPONINI,  in the last 168 hours BNP (last 3 results)  Recent Labs  10/20/13 1935 10/22/13 0300  PROBNP 471.8* 160.5*   CBG: No results found for this basename: GLUCAP,  in the last 168 hours  Recent Results (from the past 240 hour(s))  URINE CULTURE     Status: None   Collection Time    01/11/14 12:04 PM      Result Value Ref Range Status   Specimen Description URINE, CATHETERIZED   Final   Special Requests NONE   Final   Culture  Setup Time     Final   Value: 01/11/2014 14:48     Performed at [place]     Final   Value: NO GROWTH     Performed at [facility]   Culture     Final   Value: NO GROWTH     Performed at [facility]   Report Status 01/12/2014 FINAL   Final    Studies: Ct Angio Chest Pe W/cm &/or Wo Cm  01/12/2014   CLINICAL DATA:  Chest pain, hypoxia, sickle cell crisis.  EXAM: CT ANGIOGRAPHY CHEST WITH CONTRAST  TECHNIQUE: Multidetector CT imaging of the chest was performed using the standard protocol during bolus administration of intravenous contrast. Multiplanar CT image reconstructions and MIPs were  obtained to evaluate the vascular anatomy.  CONTRAST:  144mL OMNIPAQUE IOHEXOL 350 MG/ML SOLN  COMPARISON:  Chest CT 09/24/2013  FINDINGS: The chest wall is unremarkable and stable. The thyroid gland appears normal. The bony thorax is intact. 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  The heart is enlarged but stable. No pericardial effusion. No mediastinal or hilar mass or adenopathy. The aorta is normal in caliber. No dissection. The esophagus is grossly normal.  The pulmonary arterial tree is fairly well opacified. There is moderate breathing motion artifact but no definite filling defects to suggest pulmonary embolism.  Examination of the lung parenchyma demonstrates bibasilar atelectasis. No definite infiltrates or edema. No worrisome pulmonary mass lesions. No pleural effusion.  The upper abdomen is unremarkable. A calcified infarcted spleen is noted.  Review of the MIP images confirms the above findings.  IMPRESSION: No CT findings for pulmonary embolism.  Stable cardiac enlargement but no pericardial effusion.  Bibasilar atelectasis.   Electronically Signed   By: Kalman Jewels M.D.   On: 01/12/2014 17:33   Scheduled Meds: . amitriptyline  25 mg Oral QHS  . feeding supplement (ENSURE COMPLETE)  237 mL Oral TID BM  . folic acid  1 mg Oral q morning - 10a  . heparin  5,000 Units Subcutaneous 3 times per day  . HYDROmorphone PCA 0.3 mg/mL   Intravenous 6 times per day  . HYDROmorphone  4 mg Oral Q4H  . hydroxyurea  2,500 mg Oral Daily  . morphine  60 mg Oral 3 times per day  . multivitamin with minerals  1 tablet Oral Daily  . pantoprazole  40 mg Oral Daily  . piperacillin-tazobactam (ZOSYN)  IV  3.375 g Intravenous 3 times per day  . senna-docusate  1 tablet Oral BID  . sodium chloride  10-40 mL Intracatheter Q12H  . vancomycin  1,000 mg Intravenous 3 times per day   Continuous Infusions: . sodium chloride     Principal Problem:   Sickle cell crisis Active Problems:    Chronic pain  Gennie Eisinger L Yahia Bottger  [facility] Pager [phone].  If 7PM-7AM, please contact night-coverage at [email or web address], password [facility] 01/13/2014, 9:03 AM  LOS: 12 days

## 2014-01-14 LAB — COMPREHENSIVE METABOLIC PANEL
ALT: 33 U/L (ref 0–53)
AST: 32 U/L (ref 0–37)
Albumin: 3.1 g/dL — ABNORMAL LOW (ref 3.5–5.2)
Alkaline Phosphatase: 229 U/L — ABNORMAL HIGH (ref 39–117)
BUN: 16 mg/dL (ref 6–23)
CALCIUM: 9.4 mg/dL (ref 8.4–10.5)
CO2: 27 mEq/L (ref 19–32)
Chloride: 97 mEq/L (ref 96–112)
Creatinine, Ser: 0.84 mg/dL (ref 0.50–1.35)
GFR calc non Af Amer: >90 mL/min (ref >90)
GLUCOSE: 107 mg/dL — AB (ref 70–99)
Potassium: 4.4 mEq/L (ref 3.7–5.3)
Sodium: 135 mEq/L — ABNORMAL LOW (ref 137–147)
TOTAL PROTEIN: 7.1 g/dL (ref 6.0–8.3)
Total Bilirubin: 1.6 mg/dL — ABNORMAL HIGH (ref 0.3–1.2)

## 2014-01-14 LAB — CBC
HCT: 17.7 % — ABNORMAL LOW (ref 39.0–52.0)
HEMOGLOBIN: 6.2 g/dL — AB (ref 13.0–17.0)
MCH: 32 pg (ref 26.0–34.0)
MCHC: 35 g/dL (ref 30.0–36.0)
MCV: 91.2 fL (ref 78.0–100.0)
PLATELETS: 271 10*3/uL (ref 150–400)
RBC: 1.94 MIL/uL — ABNORMAL LOW (ref 4.22–5.81)
RDW: 19.1 % — ABNORMAL HIGH (ref 11.5–15.5)
WBC: 13.9 10*3/uL — ABNORMAL HIGH (ref 4.0–10.5)

## 2014-01-14 LAB — VANCOMYCIN, TROUGH: VANCOMYCIN TR: 14.2 ug/mL (ref 10.0–20.0)

## 2014-01-14 MED ORDER — HYDROMORPHONE 0.3 MG/ML IV SOLN
INTRAVENOUS | Status: DC
  Administered 2014-01-14: 2.7 mg via INTRAVENOUS
  Administered 2014-01-14: 4.4 mg via INTRAVENOUS
  Administered 2014-01-14: 4.13 mg via INTRAVENOUS
  Administered 2014-01-14: 19:00:00 via INTRAVENOUS
  Administered 2014-01-14: 5.1 mg via INTRAVENOUS
  Administered 2014-01-14: 12:00:00 via INTRAVENOUS
  Administered 2014-01-15: 3.12 mg via INTRAVENOUS
  Administered 2014-01-15: 4.2 mg via INTRAVENOUS
  Filled 2014-01-14 (×3): qty 25

## 2014-01-14 MED ORDER — HYDROMORPHONE HCL PF 1 MG/ML IJ SOLN
0.5000 mg | INTRAMUSCULAR | Status: DC | PRN
  Administered 2014-01-14 – 2014-01-19 (×12): 0.5 mg via INTRAVENOUS
  Filled 2014-01-14 (×12): qty 1

## 2014-01-14 MED ORDER — SULFAMETHOXAZOLE-TMP DS 800-160 MG PO TABS
1.0000 | ORAL_TABLET | Freq: Two times a day (BID) | ORAL | Status: DC
  Administered 2014-01-15 – 2014-01-20 (×11): 1 via ORAL
  Filled 2014-01-14 (×12): qty 1

## 2014-01-14 MED ORDER — SACCHAROMYCES BOULARDII 250 MG PO CAPS
250.0000 mg | ORAL_CAPSULE | Freq: Two times a day (BID) | ORAL | Status: DC
  Administered 2014-01-14 – 2014-01-18 (×10): 250 mg via ORAL
  Filled 2014-01-14 (×12): qty 1

## 2014-01-14 NOTE — Progress Notes (Signed)
ANTIBIOTIC CONSULT NOTE - Follow Up  Pharmacy Consult for Zosyn and Vancomycin Indication: r/o sepsis  No Known Allergies  Patient Measurements: Height: 5\' 9"  (175.3 cm) Weight: 169 lb 1.6 oz (76.703 kg) IBW/kg (Calculated) : 70.7  Vital Signs: Temp: 98.7 F (37.1 C) (05/03 0435) Temp src: Oral (05/03 0435) BP: 103/68 mmHg (05/03 0435) Pulse Rate: 109 (05/03 0435) Intake/Output from previous day: 05/02 0701 - 05/03 0700 In: 3074.5 [P.O.:1440; I.V.:634.5; IV Piggyback:1000] Out: 1350 [Urine:1350] Intake/Output from this shift:    Labs:  Recent Labs  01/13/14 0505 01/14/14 0500  WBC 15.4* 13.9*  HGB 6.3* 6.2*  PLT 281 271  CREATININE 0.86 0.84   Estimated Creatinine Clearance: 129.8 ml/min (by C-G formula based on Cr of 0.84).  Recent Labs  01/14/14 0500  [place] 14.2     Microbiology: Recent Results (from the past 720 hour(s))  URINE CULTURE     Status: None   Collection Time    01/02/14  1:43 AM      Result Value Ref Range Status   Specimen Description URINE, CLEAN CATCH   Final   Special Requests Normal   Final   Culture  Setup Time     Final   Value: 01/02/2014 08:38     Performed at [place]     Final   Value: NO GROWTH     Performed at [facility]   Culture     Final   Value: NO GROWTH     Performed at [facility]   Report Status 01/03/2014 FINAL   Final  CULTURE, BLOOD (ROUTINE X 2)     Status: None   Collection Time    01/02/14  6:10 AM      Result Value Ref Range Status   Specimen Description BLOOD LEFT HAND   Final   Special Requests BOTTLES DRAWN AEROBIC ONLY 2CC   Final   Culture  Setup Time     Final   Value: 01/02/2014 08:36     Performed at [facility]   Culture     Final   Value: BURKHOLDERIA PICKETTII     Note: Gram Stain Report Called to,Read Back By and Verified With: [place] ON 4.24.15 @ 5:30pM BY [place]     Performed at [facility]   Report Status 01/10/2014  FINAL   Final   Organism ID, Bacteria BURKHOLDERIA PICKETTII   Final  CULTURE, BLOOD (ROUTINE X 2)     Status: None   Collection Time    01/02/14  6:20 AM      Result Value Ref Range Status   Specimen Description BLOOD LEFT HAND   Final   Special Requests BOTTLES DRAWN AEROBIC ONLY 2CC   Final   Culture  Setup Time     Final   Value: 01/02/2014 08:36     Performed at [facility]   Culture     Final   Value: NO GROWTH 5 DAYS     Performed at [facility]   Report Status 01/08/2014 FINAL   Final  URINE CULTURE     Status: None   Collection Time    01/11/14 12:04 PM      Result Value Ref Range Status   Specimen Description URINE, CATHETERIZED   Final   Special Requests NONE   Final   Culture  Setup Time     Final   Value: 01/11/2014 14:48  Performed at [place]     Final   Value: NO GROWTH     Performed at [facility]   Culture     Final   Value: NO GROWTH     Performed at [facility]   Report Status 01/12/2014 FINAL   Final  CULTURE, BLOOD (ROUTINE X 2)     Status: None   Collection Time    01/12/14 10:25 AM      Result Value Ref Range Status   Specimen Description BLOOD LEFT HAND   Final   Special Requests BOTTLES DRAWN AEROBIC ONLY 2CC   Final   Culture  Setup Time     Final   Value: 01/12/2014 14:40     Performed at [facility]   Culture     Final   Value:        BLOOD CULTURE RECEIVED NO GROWTH TO DATE CULTURE WILL BE HELD FOR 5 DAYS BEFORE ISSUING A FINAL NEGATIVE REPORT     Performed at [facility]   Report Status PENDING   Incomplete    Medical History: Past Medical History  Diagnosis Date  . Sickle cell disease   . Avascular necrosis of femur head, right   . H/O allergic rhinitis   . H/O hypokalemia   . Chronic pain syndrome   . H/O wheezing     with colds  . Non-ABO incompatible blood transfusion 05/30/2013    Assessment: 78 yoM admitted 4/20 for sickle cell crisis,  noted to be possibly abusing IV pain medications during this admission. 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 5/1: patient continues to have fevers, low blood pressure and tachycardia. Pharmacy is asked to switch abx to Zosyn and Vanc for broader-coverage.   4/24 PM >> Cefepime >> 4/28 4/29 >> LVQ (MD) >> 5/1 5/1 >> Zosyn >> 5/1 >> Vanc >>  Tmax: afebrile since 5/2 WBCs: improved (steroids 4/24-4/28) Renal: SCr stable Vanc Trough: slightly subtherapeutic  4/21 blood: 1/2 Burkholderia pickettii (S=aminoglycosides, CTX, Zosyn, FQs, Primaxin, Bactrim; R=ceftaz, aztreonam) 4/21 urine: NGF 5/1: blood x 2: NGTD  Goal of Therapy:  Vancomycin trough 15-20 mg/l Appropriate antibiotic dosing for renal function; eradication of infection  Plan:  1) Continue vancomycin 1g IV q8, as drug can expect to accumulate some with trough just below goal 2) Continue current Zosyn dosing   Adrian Saran, PharmD, BCPS Pager [phone] 01/14/2014 7:14 AM

## 2014-01-14 NOTE — Progress Notes (Signed)
PHYSICIAN PROGRESS NOTE  Dennis Bernard [id number] DOB: 1985-12-09 DOA: 01/01/2014  01/14/2014   PCP: Wilhelmina Mcardle, MD  Admission History: Dennis Bernard is a 28 y.o. male with istory of sickle cell anemia presents to the ED with a few days of pain and shortness of breath and sickle cell crisis. Patient has had pain in his chest as well as his shoulder. Patient states that he has noted the pain to be sharp and is typical of his sickle crisis. He states last admission was in February sometime for this. He has been taking his pain medications regularly lately due to increase in his symptoms. No cough noted. No hemoptysis is noted. Has not had any syncope. He has noted some fevers though he is not entirely sure about this. He has no nausea and no vomiting. Denies having any abdominal pain.   Assessment/Plan: 1. Hb SS with crisis: Pt reports that he is still having pain requiring PCA treatment. He has used 23.94 mg with 61 demands and 60 deliveries within 24 hours. Pt. reports that he has been ambulating in the room. He has also been working with PT.  Will further reduce dose PCA today and reduce clinician-assisted dosing. Continue his long-acting pain medication and adjunctive therapies. Continue hydrea.   2. Anemia: Pt has anemia with a baseline Hb of 7. He was transfused 1 unit of blood on 12/26/2012 during his last admission to [facility]. He has shown no signs of hemolysis and doubt that he is having a late transfusion reaction as his bilirubin and LDH are not elevated above his baseline. Pt has severe antibodies and should not be transfused unless he has clinical need for transfusion and not because of a number.  3. Hypoxia - Improved now. Likely related to atelectasis as shown in CT chest. No PE or pneumonia seen. Continue supplemental oxygen as needed but attempt to wean today and encouraged incentive spirometry.  4. Leukocytosis: slightly improved. No clear sign of infection found, repeat BC 5/1  NGTD. Likely multifactorial from bacteremia and inflammation from vaso-occlusion.  Continue zosyn/vanc IV thru today and start Bactrim DS tomorrow (full 14 day course of antibiotics required).  Repeat blood culture 5/1 : NGTD  5. Sinus Tachycardia: Improving as infection is being more aggressively treated. Likely related to underlying infection, continue broad antibiotic coverage today. TSH and cortisol normal. EKG reveals sinus rhythm.  Discontinue telemetry today.   6. Hypotension/ Sepsis: In retrospect was likely due to GNR bacteremia versus ACS. Lactic acid ok. Following.  7. 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 Repeated blood culture NGTD.  8. Fever: Temp trending down. Following. CT chest reveals atelectasis which can cause fever. No pneumonia seen. Blood culture NGTD. MRI of hips show no abscess. Urine shows no signs of infection.  9. Constipation - Miralax BID will be ordered. Last BM 5/2. Prn laxatives ordered as well. 10. Hyponatremia - saline lock IVs as pt is eating better.   11. Psychosocial: Pt has had much difficulty during this admission with suspicious behaviors surrounding his opiate analgesics. The patient admits to having difficulty with telling the truth as he wants to be endeared to his conversant. He admits to issues of trust and has fears of being stereotyped. I have spoken with patient at length on many occasions about the need for therapy to assist him in addressing his Psychosocial issues. He has refused in the past but now states that he would be willing to follow through on an  outpatient basis. Dennis Bernard has misrepresented information and will often mischaracterize his pain to achieve secondary gain so that it is difficult to assess if the patient is actually having pain adversely impacting his function, or if he just wants to obtain medication. The patient also does not have a prescriber for his analgesics as he has burnt his bridges with  multiple providers and thus he has much incentive for mis characterizing his pain as it is all subjective.  Code Status: Full Code  Family Communication: N/A  Disposition Plan: Not yet ready for discharge  HPI/Subjective: Pt reports that he is eating and drinking better.  He has been ambulating in room and working with PT.  He still has pain in his legs.  He has still had high demand from PCA but overall in better spirits and reports that he has been using his incentive spirometer.  He does not report feeling fever or chills.  He denies chest pain and shortness of breath.    Objective: Filed Vitals:   01/14/14 0726  BP:   Pulse:   Temp:   Resp: 13    Intake/Output Summary (Last 24 hours) at 01/14/14 0908 Last data filed at 01/14/14 0616  Gross per 24 hour  Intake 3074.54 ml  Output   1350 ml  Net 1724.54 ml   Filed Weights   01/08/14 0551 01/09/14 0601 01/12/14 1124  Weight: 185 lb 10 oz (84.2 kg) 185 lb 6.4 oz (84.097 kg) 169 lb 1.6 oz (76.703 kg)    Review Of Systems: As detailed above, otherwise all reviewed and negative  Exam: General: Alert, awake, oriented x3, in no acute distress, but appears weak and chronically ill. Pt resting comfortably in bed.  HEENT: [place]/AT PEERL, EOMI, anicteric  Neck: Trachea midline, no masses, no thyromegal,y no JVD, no carotid bruit  OROPHARYNX: Dry Mucous Membranes, No exudate/ erythema/lesions.  Heart: tachycardic without murmurs, rubs, gallops.  Lungs: shallow but clear  Abdomen: Soft, nontender, nondistended, positive bowel sounds, no masses no hepatosplenomegaly noted.  Neuro: No focal neurological deficits noted cranial nerves grossly intact. Strength normal in bilateral upper and lower extremities.  Musculoskeletal: No warm swelling or erythema around joints, no spinal tenderness noted.  Psychiatric: Patient alert and oriented x3, good insight and cognition, good recent to remote recall.  Lymph node survey: No cervical axillary or  inguinal lymphadenopathy noted.  Data Reviewed: Basic Metabolic Panel:  Recent Labs Lab 01/09/14 0550 01/10/14 0345 01/13/14 0505 01/14/14 0500  NA 140 139 135* 135*  K 4.4 4.0 4.0 4.4  CL 102 99 97 97  CO2 29 29 27 27   GLUCOSE 109* 97 120* 107*  BUN 16 18 19 16   CREATININE 0.72 0.76 0.86 0.84  CALCIUM 9.1 9.4 9.1 9.4   Liver Function Tests:  Recent Labs Lab 01/09/14 0550 01/10/14 0345 01/13/14 0505 01/14/14 0500  AST 15 25 23  32  ALT 22 20 23  33  ALKPHOS 88 103 147* 229*  BILITOT 0.9 1.2 1.8* 1.6*  PROT 6.3 6.7 6.7 7.1  ALBUMIN 3.1* 3.4* 2.9* 3.1*   No results found for this basename: LIPASE, AMYLASE,  in the last 168 hours No results found for this basename: AMMONIA,  in the last 168 hours CBC:  Recent Labs Lab 01/08/14 0605 01/09/14 0550 01/10/14 0345 01/11/14 0255 01/13/14 0505 01/14/14 0500  WBC 17.1* 23.4* 20.5* 19.1* 15.4* 13.9*  NEUTROABS 10.2* 17.8* 13.7* 14.3*  --   --   HGB 6.2* 6.6* 6.6* 7.0* 6.3* 6.2*  HCT 19.0* 20.3* 20.0* 21.4* 19.0* 17.7*  MCV 100.5* 101.5* 100.0 99.5 95.0 91.2  PLT 530* 550* 540* 449* 281 271   Cardiac Enzymes: No results found for this basename: CKTOTAL, CKMB, CKMBINDEX, TROPONINI,  in the last 168 hours BNP (last 3 results)  Recent Labs  10/20/13 1935 10/22/13 0300  PROBNP 471.8* 160.5*   CBG: No results found for this basename: GLUCAP,  in the last 168 hours  Recent Results (from the past 240 hour(s))  URINE CULTURE     Status: None   Collection Time    01/11/14 12:04 PM      Result Value Ref Range Status   Specimen Description URINE, CATHETERIZED   Final   Special Requests NONE   Final   Culture  Setup Time     Final   Value: 01/11/2014 14:48     Performed at [place]     Final   Value: NO GROWTH     Performed at [facility]   Culture     Final   Value: NO GROWTH     Performed at [facility]   Report Status 01/12/2014 FINAL   Final  CULTURE, BLOOD  (ROUTINE X 2)     Status: None   Collection Time    01/12/14 10:25 AM      Result Value Ref Range Status   Specimen Description BLOOD LEFT HAND   Final   Special Requests BOTTLES DRAWN AEROBIC ONLY 2CC   Final   Culture  Setup Time     Final   Value: 01/12/2014 14:40     Performed at [facility]   Culture     Final   Value:        BLOOD CULTURE RECEIVED NO GROWTH TO DATE CULTURE WILL BE HELD FOR 5 DAYS BEFORE ISSUING A FINAL NEGATIVE REPORT     Performed at [facility]   Report Status PENDING   Incomplete     Studies: Ct Angio Chest Pe W/cm &/or Wo Cm  01/12/2014   CLINICAL DATA:  Chest pain, hypoxia, sickle cell crisis.  EXAM: CT ANGIOGRAPHY CHEST WITH CONTRAST  TECHNIQUE: Multidetector CT imaging of the chest was performed using the standard protocol during bolus administration of intravenous contrast. Multiplanar CT image reconstructions and MIPs were obtained to evaluate the vascular anatomy.  CONTRAST:  158mL OMNIPAQUE IOHEXOL 350 MG/ML SOLN  COMPARISON:  Chest CT 09/24/2013  FINDINGS: The chest wall is unremarkable and stable. The thyroid gland appears normal. The bony thorax is intact. 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  The heart is enlarged but stable. No pericardial effusion. No mediastinal or hilar mass or adenopathy. The aorta is normal in caliber. No dissection. The esophagus is grossly normal.  The pulmonary arterial tree is fairly well opacified. There is moderate breathing motion artifact but no definite filling defects to suggest pulmonary embolism.  Examination of the lung parenchyma demonstrates bibasilar atelectasis. No definite infiltrates or edema. No worrisome pulmonary mass lesions. No pleural effusion.  The upper abdomen is unremarkable. A calcified infarcted spleen is noted.  Review of the MIP images confirms the above findings.  IMPRESSION: No CT findings for pulmonary embolism.  Stable cardiac enlargement but no pericardial effusion.   Bibasilar atelectasis.   Electronically Signed   By: Kalman Jewels M.D.   On: 01/12/2014 17:33    Scheduled Meds: . amitriptyline  25 mg Oral QHS  . feeding supplement (  ENSURE COMPLETE)  237 mL Oral TID BM  . folic acid  1 mg Oral q morning - 10a  . heparin  5,000 Units Subcutaneous 3 times per day  . HYDROmorphone PCA 0.3 mg/mL   Intravenous 6 times per day  . HYDROmorphone  4 mg Oral Q4H  . hydroxyurea  2,500 mg Oral Daily  . morphine  60 mg Oral 3 times per day  . multivitamin with minerals  1 tablet Oral Daily  . pantoprazole  40 mg Oral Daily  . piperacillin-tazobactam (ZOSYN)  IV  3.375 g Intravenous 3 times per day  . saccharomyces boulardii  250 mg Oral BID  . senna-docusate  1 tablet Oral BID  . sodium chloride  10-40 mL Intracatheter Q12H  . [START ON 01/15/2014] sulfamethoxazole-trimethoprim  1 tablet Oral Q12H  . vancomycin  1,000 mg Intravenous 3 times per day   Continuous Infusions: . sodium chloride 10 mL/hr at 01/13/14 2251   Principal Problem:   Sickle cell crisis Active Problems:   Chronic pain   Hyponatremia  Honestee Bernard Dennis Bernard  [facility] Pager [phone].  If 7PM-7AM, please contact night-coverage at [email or web address], password [facility] 01/14/2014, 9:08 AM  LOS: 13 days

## 2014-01-15 DIAGNOSIS — T80A0XA Non-ABO incompatibility reaction due to transfusion of blood or blood products, unspecified, initial encounter: Secondary | ICD-10-CM

## 2014-01-15 LAB — CBC
HCT: 16.6 % — ABNORMAL LOW (ref 39.0–52.0)
Hemoglobin: 5.5 g/dL — CL (ref 13.0–17.0)
MCH: 30.6 pg (ref 26.0–34.0)
MCHC: 33.1 g/dL (ref 30.0–36.0)
MCV: 92.2 fL (ref 78.0–100.0)
PLATELETS: 266 10*3/uL (ref 150–400)
RBC: 1.8 MIL/uL — ABNORMAL LOW (ref 4.22–5.81)
RDW: 19.9 % — AB (ref 11.5–15.5)
WBC: 13.3 10*3/uL — ABNORMAL HIGH (ref 4.0–10.5)

## 2014-01-15 LAB — DIFFERENTIAL
Basophils Absolute: 0 10*3/uL (ref 0.0–0.1)
Basophils Relative: 0 % (ref 0–1)
Eosinophils Absolute: 0.1 10*3/uL (ref 0.0–0.7)
Eosinophils Relative: 1 % (ref 0–5)
Lymphocytes Relative: 23 % (ref 12–46)
Lymphs Abs: 3.1 10*3/uL (ref 0.7–4.0)
MONO ABS: 1.2 10*3/uL — AB (ref 0.1–1.0)
Monocytes Relative: 9 % (ref 3–12)
NEUTROS PCT: 67 % (ref 43–77)
Neutro Abs: 8.9 10*3/uL — ABNORMAL HIGH (ref 1.7–7.7)
nRBC: 2 /100 WBC — ABNORMAL HIGH

## 2014-01-15 LAB — COMPREHENSIVE METABOLIC PANEL
ALBUMIN: 3 g/dL — AB (ref 3.5–5.2)
ALT: 30 U/L (ref 0–53)
AST: 25 U/L (ref 0–37)
Alkaline Phosphatase: 252 U/L — ABNORMAL HIGH (ref 39–117)
BUN: 19 mg/dL (ref 6–23)
CO2: 28 meq/L (ref 19–32)
CREATININE: 0.95 mg/dL (ref 0.50–1.35)
Calcium: 8.9 mg/dL (ref 8.4–10.5)
Chloride: 97 mEq/L (ref 96–112)
GFR calc Af Amer: >90 mL/min (ref >90)
Glucose, Bld: 108 mg/dL — ABNORMAL HIGH (ref 70–99)
Potassium: 4.6 mEq/L (ref 3.7–5.3)
Sodium: 136 mEq/L — ABNORMAL LOW (ref 137–147)
Total Bilirubin: 1.8 mg/dL — ABNORMAL HIGH (ref 0.3–1.2)
Total Protein: 7 g/dL (ref 6.0–8.3)

## 2014-01-15 LAB — LACTATE DEHYDROGENASE: LDH: 424 U/L — ABNORMAL HIGH (ref 94–250)

## 2014-01-15 MED ORDER — IBUPROFEN 800 MG PO TABS
800.0000 mg | ORAL_TABLET | Freq: Once | ORAL | Status: AC
  Administered 2014-01-15: 800 mg via ORAL
  Filled 2014-01-15: qty 1

## 2014-01-15 MED ORDER — HYDROMORPHONE 0.3 MG/ML IV SOLN
INTRAVENOUS | Status: DC
  Administered 2014-01-15: 4.39 mg via INTRAVENOUS
  Administered 2014-01-15: 2.8 mg via INTRAVENOUS
  Administered 2014-01-15: 5.9 mg via INTRAVENOUS
  Administered 2014-01-15: 0.2 mg via INTRAVENOUS
  Administered 2014-01-16: 3.39 mg via INTRAVENOUS
  Administered 2014-01-16: 2.9 mg via INTRAVENOUS
  Administered 2014-01-16: 2.74 mg via INTRAVENOUS
  Administered 2014-01-16: 0.3 mg via INTRAVENOUS
  Administered 2014-01-16: 2.59 mg via INTRAVENOUS
  Administered 2014-01-16: 19:00:00 via INTRAVENOUS
  Administered 2014-01-16: 2.59 mg via INTRAVENOUS
  Administered 2014-01-17: 3.19 mg via INTRAVENOUS
  Administered 2014-01-17: 2.99 mg via INTRAVENOUS
  Administered 2014-01-17: 17:00:00 via INTRAVENOUS
  Administered 2014-01-17: 3.99 mg via INTRAVENOUS
  Administered 2014-01-17: 3.59 mg via INTRAVENOUS
  Administered 2014-01-17: 2.17 mg via INTRAVENOUS
  Administered 2014-01-17: 03:00:00 via INTRAVENOUS
  Administered 2014-01-17: 1.19 mg via INTRAVENOUS
  Administered 2014-01-18: 14:00:00 via INTRAVENOUS
  Administered 2014-01-18: 3.59 mL via INTRAVENOUS
  Administered 2014-01-18: 2.39 mg via INTRAVENOUS
  Administered 2014-01-18: 01:00:00 via INTRAVENOUS
  Administered 2014-01-18: 1.19 mg via INTRAVENOUS
  Administered 2014-01-18: 3.39 mg via INTRAVENOUS
  Administered 2014-01-19: 1.78 mg via INTRAVENOUS
  Administered 2014-01-19: 2.99 mg via INTRAVENOUS
  Administered 2014-01-19: 1.59 mL via INTRAVENOUS
  Administered 2014-01-19: 2.59 mg via INTRAVENOUS
  Administered 2014-01-19: 2.19 mL via INTRAVENOUS
  Administered 2014-01-19: 18:00:00 via INTRAVENOUS
  Administered 2014-01-20: 2.75 mg via INTRAVENOUS
  Administered 2014-01-20: 05:00:00 via INTRAVENOUS
  Administered 2014-01-20: 1.59 mg via INTRAVENOUS
  Administered 2014-01-20: 6.19 mg via INTRAVENOUS
  Filled 2014-01-15 (×11): qty 25

## 2014-01-15 MED ORDER — SODIUM CHLORIDE 0.9 % IV BOLUS (SEPSIS)
500.0000 mL | Freq: Once | INTRAVENOUS | Status: AC
  Administered 2014-01-15: 500 mL via INTRAVENOUS

## 2014-01-15 NOTE — Progress Notes (Signed)
CARE MANAGEMENT NOTE 01/15/2014  Patient:  Dennis Bernard, Dennis Bernard   Account Number:  [id number]  Date Initiated:  01/05/2014  Documentation initiated by:  DAVIS,TYMEEKA  Subjective/Objective Assessment:   28 yo male admitted with sickle cell crisis.PCP: Wilhelmina Mcardle, MD     Action/Plan:   Home when stable   Anticipated DC Date:  01/18/2014   Anticipated DC Plan:  [place]  CM consult      Choice offered to / List presented to:  C-1 Patient   DME arranged  NA      DME agency  NA        [place].   Status of service:  Completed, signed off Medicare Important Message given?  NA - LOS <3 / Initial given by admissions (If response is "NO", the following Medicare IM given date fields will be blank) Date Medicare IM given:   Date Additional Medicare IM given:  01/09/2014  Discharge Disposition:    Per UR Regulation:  Reviewed for med. necessity/level of care/duration of stay  If discussed at [place] of Stay Meetings, dates discussed:    Comments:  01/15/14 Lanitra Battaglini RN,BSN NCM [phone] PT-HH.[facility] CHOSEN BY PATIENT.TC KRISTEN REP [facility] AWARE OF REFERRAL.AWAIT FINAL HHPT ORDER.IV PAIN CONTROL WEANING PCA.HGB 5.5.HEMATOLOGY CONS.  01-09-14 Sunday Spillers RN CM 1600 Spoke with patient at bedside, states he is interested in [facility] PT but is not sure where he will d/c to. At first he stated he would stay in [place] but when pressed for an address he retracked and stated "just use my parents address". Tried to clarify d/c plans, patient will discuss with parents.  01/05/14 [place] Chart reviewed for utilization of services.

## 2014-01-15 NOTE — Progress Notes (Signed)
After treatments/interventions the patient's temperature decreased to 99 F from 101.7 F.  MD was notified.

## 2014-01-15 NOTE — Progress Notes (Signed)
PHYSICIAN PROGRESS NOTE  Dennis Bernard VZD:[id number] DOB: [birth year] DOA: 01/01/2014  01/15/2014   PCP: Wilhelmina Mcardle, MD  Admission History: Dennis Bernard is a 28 y.o. male with history of sickle cell anemia presents to the ED with a few days of pain and shortness of breath and sickle cell crisis. Patient has had pain in his chest as well as his shoulder. Patient states that he has noted the pain to be sharp and is typical of his sickle crisis. He states last admission was in February sometime for this. He has been taking his pain medications regularly lately due to increase in his symptoms. No cough noted. No hemoptysis is noted. Has not had any syncope. He has noted some fevers though he is not entirely sure about this. He has no nausea and no vomiting. Denies having any abdominal pain.   Assessment/Plan: 1. Hb SS with crisis: Pt reports that he is still having pain requiring PCA treatment. He has used 23.75 mg with 78 demands and 78 deliveries within 24 hours. Pt. reports that he has been ambulating in the room but does feel weak and SOB. Will further reduce dose PCA today and reduce clinician-assisted dosing. Continue his long-acting pain medication and adjunctive therapies. Hold hydrea because of low hemoglobin.  2. Anemia: Pt has anemia with a baseline Hb of 7. He was transfused 1 unit of blood on 12/26/2012 during his last admission to [facility]. He had shown no signs of hemolysis and doubt that he is having a late transfusion reaction as his bilirubin and LDH are not elevated above his baseline. Pt has severe antibodies and should not be transfused unless he has clinical need for transfusion.  Per Dr. Beryle Beams who has seen and followed him in the past, he usually does not recommend transfusing unless hemoglobin is less than 5.  I am going to consult inpatient hematology for eval and recommendations in case he does require a transfusion as I would like for them to be on board for  recommendations.  The patient has been premedicated in the past and that has helped.  However, there is no easy solution to this problem.   3. Hypoxia - Improved now. Likely related to atelectasis as shown in CT chest. No PE or pneumonia seen. Continue supplemental oxygen as needed but attempt to wean and encouraged incentive spirometry.  4. Leukocytosis: slightly improved. No clear sign of infection found, repeat BC 5/1 NGTD. Likely multifactorial from bacteremia and inflammation from vaso-occlusion. Bactrim DS started (full 14 day course of antibiotics required). Repeat blood culture 5/1 : NGTD  5. Sinus Tachycardia: Improving as infection more aggressively treated. Likely multifactorial related to underlying infection. TSH and cortisol normal. EKG reveals sinus rhythm. Discontinued telemetry.  6. Hypotension/ Sepsis: In retrospect was likely due to GNR bacteremia versus ACS. Lactic acid ok. Following.  7. 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 Repeated blood culture NGTD.  8. Fever: Temp trending down. He did have spike in temp to 101 overnight.  Following. CT chest reveals atelectasis which can cause fever. No pneumonia seen. Blood culture NGTD. MRI of hips show no abscess. Urine shows no signs of infection.  9. Constipation - Miralax BID will be ordered. Last BM 5/2. Prn laxatives ordered as well. 10. Hyponatremia - saline lock IVs as pt is eating better.  11. Psychosocial: Pt has had much difficulty during this admission with suspicious behaviors surrounding his opiate analgesics. The patient admits to having difficulty with telling  the truth as he wants to be endeared to his conversant. He admits to issues of trust and has fears of being stereotyped. I have spoken with patient at length on many occasions about the need for therapy to assist him in addressing his Psychosocial issues. He has refused in the past but now states that he would be willing to follow through on  an outpatient basis. Dennis Bernard has misrepresented information and will often mischaracterize his pain to achieve secondary gain so that it is difficult to assess if the patient is actually having pain adversely impacting his function, or if he just wants to obtain medication. The patient also does not have a prescriber for his analgesics as he has burnt his bridges with multiple providers and thus he has much incentive for mis characterizing his pain as it is all subjective.  Code Status: Full Code  Family Communication: N/A  Disposition Plan: Not yet ready for discharge  HPI/Subjective: Pt reports that he has been getting up in the room and going to the bathroom.  He says that he feels weak when he gets up and short of breath but has not been unsteady on his feet.  He has been having no cough or chest pain.  He says that his abdominal pain has improved.  He also says that his appetite has been improving.  He reports that he had a large BM 2 days ago.    Objective: Filed Vitals:   01/15/14 0540  BP: 97/53  Pulse: 97  Temp: 97.3 F (36.3 C)  Resp: 14    Intake/Output Summary (Last 24 hours) at 01/15/14 0816 Last data filed at 01/15/14 0600  Gross per 24 hour  Intake 1648.8 ml  Output   2075 ml  Net -426.2 ml   Filed Weights   01/08/14 0551 01/09/14 0601 01/12/14 1124  Weight: 185 lb 10 oz (84.2 kg) 185 lb 6.4 oz (84.097 kg) 169 lb 1.6 oz (76.703 kg)   Review Of Systems: As detailed above, otherwise all reviewed and negative  Exam:  General: Alert, awake, oriented x3, in no acute distress, but appears weak and chronically ill. Pt resting comfortably in bed.  HEENT: [place]/AT PEERL, EOMI, mild scleral icterus. Neck: Trachea midline, no masses, no thyromegal,y no JVD, no carotid bruit  OROPHARYNX: Pale Mucous Membranes, No exudate/ erythema/lesions.  Heart: normal s1, s2 sounds without murmurs, rubs, gallops.  Lungs: BBS clear  Abdomen: Soft, nontender, nondistended, positive bowel  sounds, no masses no hepatosplenomegaly noted.  Neuro: No focal neurological deficits noted cranial nerves grossly intact. Strength normal in bilateral upper and lower extremities.  Musculoskeletal: No warm swelling or erythema around joints, no spinal tenderness noted.  Psychiatric: Patient alert and oriented x3, good insight and cognition, good recent to remote recall.  Lymph node survey: No cervical axillary or inguinal lymphadenopathy noted.  Data Reviewed: Basic Metabolic Panel:  Recent Labs Lab 01/09/14 0550 01/10/14 0345 01/13/14 0505 01/14/14 0500 01/15/14 0500  NA 140 139 135* 135* 136*  K 4.4 4.0 4.0 4.4 4.6  CL 102 99 97 97 97  CO2 29 29 27 27 28   GLUCOSE 109* 97 120* 107* 108*  BUN 16 18 19 16 19   CREATININE 0.72 0.76 0.86 0.84 0.95  CALCIUM 9.1 9.4 9.1 9.4 8.9   Liver Function Tests:  Recent Labs Lab 01/09/14 0550 01/10/14 0345 01/13/14 0505 01/14/14 0500 01/15/14 0500  AST 15 25 23  32 25  ALT 22 20 23  33 30  ALKPHOS 88  103 147* 229* 252*  BILITOT 0.9 1.2 1.8* 1.6* 1.8*  PROT 6.3 6.7 6.7 7.1 7.0  ALBUMIN 3.1* 3.4* 2.9* 3.1* 3.0*   No results found for this basename: LIPASE, AMYLASE,  in the last 168 hours No results found for this basename: AMMONIA,  in the last 168 hours CBC:  Recent Labs Lab 01/09/14 0550 01/10/14 0345 01/11/14 0255 01/13/14 0505 01/14/14 0500 01/15/14 0500  WBC 23.4* 20.5* 19.1* 15.4* 13.9* 13.3*  NEUTROABS 17.8* 13.7* 14.3*  --   --   --   HGB 6.6* 6.6* 7.0* 6.3* 6.2* 5.5*  HCT 20.3* 20.0* 21.4* 19.0* 17.7* 16.6*  MCV 101.5* 100.0 99.5 95.0 91.2 92.2  PLT 550* 540* 449* 281 271 266   Cardiac Enzymes: No results found for this basename: CKTOTAL, CKMB, CKMBINDEX, TROPONINI,  in the last 168 hours BNP (last 3 results)  Recent Labs  10/20/13 1935 10/22/13 0300  PROBNP 471.8* 160.5*   CBG: No results found for this basename: GLUCAP,  in the last 168 hours  Recent Results (from the past 240 hour(s))  URINE CULTURE      Status: None   Collection Time    01/11/14 12:04 PM      Result Value Ref Range Status   Specimen Description URINE, CATHETERIZED   Final   Special Requests NONE   Final   Culture  Setup Time     Final   Value: 01/11/2014 14:48     Performed at [place]     Final   Value: NO GROWTH     Performed at [facility]   Culture     Final   Value: NO GROWTH     Performed at [facility]   Report Status 01/12/2014 FINAL   Final  CULTURE, BLOOD (ROUTINE X 2)     Status: None   Collection Time    01/12/14 10:25 AM      Result Value Ref Range Status   Specimen Description BLOOD LEFT HAND   Final   Special Requests BOTTLES DRAWN AEROBIC ONLY 2CC   Final   Culture  Setup Time     Final   Value: 01/12/2014 14:40     Performed at [facility]   Culture     Final   Value:        BLOOD CULTURE RECEIVED NO GROWTH TO DATE CULTURE WILL BE HELD FOR 5 DAYS BEFORE ISSUING A FINAL NEGATIVE REPORT     Performed at [facility]   Report Status PENDING   Incomplete   Studies: No results found.  Scheduled Meds: . amitriptyline  25 mg Oral QHS  . feeding supplement (ENSURE COMPLETE)  237 mL Oral TID BM  . folic acid  1 mg Oral q morning - 10a  . heparin  5,000 Units Subcutaneous 3 times per day  . HYDROmorphone PCA 0.3 mg/mL   Intravenous 6 times per day  . HYDROmorphone  4 mg Oral Q4H  . morphine  60 mg Oral 3 times per day  . multivitamin with minerals  1 tablet Oral Daily  . pantoprazole  40 mg Oral Daily  . saccharomyces boulardii  250 mg Oral BID  . senna-docusate  1 tablet Oral BID  . sodium chloride  10-40 mL Intracatheter Q12H  . sulfamethoxazole-trimethoprim  1 tablet Oral Q12H   Continuous Infusions: . sodium chloride 10 mL/hr at 01/13/14 2251   Principal Problem:   Sickle cell  crisis Active Problems:   Chronic pain   Hyponatremia  Dennis Bernard  [facility] Pager [phone].  If 7PM-7AM, please contact  night-coverage at [email or web address], password [email or web address] 01/15/2014, 8:16 AM  LOS: 14 days

## 2014-01-15 NOTE — Progress Notes (Signed)
PT Cancellation Note  ___Treatment cancelled today due to medical issues with patient which prohibited therapy  ___ Treatment cancelled today due to patient receiving procedure or test   ___ Treatment cancelled today due to patient's refusal to participate   _X_ Treatment cancelled today due to low HgB.  Pt is amb self in room.  Rica Koyanagi  PTA [facility]  Acute  Rehab Pager      [phone]

## 2014-01-15 NOTE — Progress Notes (Signed)
RN notified AMION floor coverage re: 101.66F temperature.  Awaiting a response.

## 2014-01-15 NOTE — Progress Notes (Signed)
RN attempted to notify MD on call re: patient's 101.59F oral temperature.

## 2014-01-16 DIAGNOSIS — D72829 Elevated white blood cell count, unspecified: Secondary | ICD-10-CM

## 2014-01-16 DIAGNOSIS — R0902 Hypoxemia: Secondary | ICD-10-CM

## 2014-01-16 DIAGNOSIS — K5900 Constipation, unspecified: Secondary | ICD-10-CM

## 2014-01-16 DIAGNOSIS — E871 Hypo-osmolality and hyponatremia: Secondary | ICD-10-CM

## 2014-01-16 LAB — COMPREHENSIVE METABOLIC PANEL
ALT: 26 U/L (ref 0–53)
AST: 22 U/L (ref 0–37)
Albumin: 2.9 g/dL — ABNORMAL LOW (ref 3.5–5.2)
Alkaline Phosphatase: 261 U/L — ABNORMAL HIGH (ref 39–117)
BUN: 16 mg/dL (ref 6–23)
CALCIUM: 9 mg/dL (ref 8.4–10.5)
CO2: 27 meq/L (ref 19–32)
Chloride: 99 mEq/L (ref 96–112)
Creatinine, Ser: 1.15 mg/dL (ref 0.50–1.35)
GFR calc Af Amer: >90 mL/min (ref >90)
GFR, EST NON AFRICAN AMERICAN: 85 mL/min — AB (ref >90)
Glucose, Bld: 122 mg/dL — ABNORMAL HIGH (ref 70–99)
Potassium: 4.9 mEq/L (ref 3.7–5.3)
Sodium: 138 mEq/L (ref 137–147)
TOTAL PROTEIN: 6.9 g/dL (ref 6.0–8.3)
Total Bilirubin: 1 mg/dL (ref 0.3–1.2)

## 2014-01-16 LAB — CBC WITH DIFFERENTIAL/PLATELET
Basophils Absolute: 0.1 10*3/uL (ref 0.0–0.1)
Basophils Relative: 0 % (ref 0–1)
EOS PCT: 1 % (ref 0–5)
Eosinophils Absolute: 0.1 10*3/uL (ref 0.0–0.7)
HCT: 15.1 % — ABNORMAL LOW (ref 39.0–52.0)
HEMOGLOBIN: 5.1 g/dL — AB (ref 13.0–17.0)
Lymphocytes Relative: 25 % (ref 12–46)
Lymphs Abs: 3.6 10*3/uL (ref 0.7–4.0)
MCH: 30.9 pg (ref 26.0–34.0)
MCHC: 33.8 g/dL (ref 30.0–36.0)
MCV: 91.5 fL (ref 78.0–100.0)
MONOS PCT: 8 % (ref 3–12)
Monocytes Absolute: 1.1 10*3/uL — ABNORMAL HIGH (ref 0.1–1.0)
Neutro Abs: 9.5 10*3/uL — ABNORMAL HIGH (ref 1.7–7.7)
Neutrophils Relative %: 66 % (ref 43–77)
Platelets: 306 10*3/uL (ref 150–400)
RBC: 1.65 MIL/uL — AB (ref 4.22–5.81)
RDW: 20.5 % — ABNORMAL HIGH (ref 11.5–15.5)
WBC: 14.3 10*3/uL — ABNORMAL HIGH (ref 4.0–10.5)

## 2014-01-16 LAB — LACTATE DEHYDROGENASE: LDH: 302 U/L — ABNORMAL HIGH (ref 94–250)

## 2014-01-16 MED ORDER — IBUPROFEN 800 MG PO TABS
800.0000 mg | ORAL_TABLET | Freq: Once | ORAL | Status: AC
  Administered 2014-01-17: 800 mg via ORAL
  Filled 2014-01-16: qty 1

## 2014-01-16 MED ORDER — SODIUM CHLORIDE 0.9 % IV BOLUS (SEPSIS)
500.0000 mL | Freq: Once | INTRAVENOUS | Status: AC
  Administered 2014-01-16: 500 mL via INTRAVENOUS

## 2014-01-16 MED ORDER — SODIUM CHLORIDE 0.9 % IV BOLUS (SEPSIS)
500.0000 mL | Freq: Once | INTRAVENOUS | Status: AC
  Administered 2014-01-17: 500 mL via INTRAVENOUS

## 2014-01-16 MED ORDER — IBUPROFEN 800 MG PO TABS
800.0000 mg | ORAL_TABLET | Freq: Once | ORAL | Status: AC
  Administered 2014-01-16: 800 mg via ORAL
  Filled 2014-01-16: qty 1

## 2014-01-16 NOTE — Progress Notes (Signed)
Pt arrived to floor room 1337 via wheelchair. Alert and oriented x4, with unsteady gait and general weakness. VS taken and pt oriented to room with no complications. I concur with previous assessment. Will continue to monitor pt thru shift.

## 2014-01-16 NOTE — Progress Notes (Signed)
Patient ID: Dennis Bernard, male   DOB: [birth year], 28 y.o.   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 Please see those notes for full details of his medical history. 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 He has also had minor reactions to matched units with fevers. These have not been abrogated by use of white cells filtered blood. During a recent admission I recommended premedication with Solu-Medrol 40 mg IV, oral Tylenol, and Benadryl, prior to each transfuse unit and this did eliminate the minor reactions.  He has frequent admissions to the hospital for pain crises. There is usually not an obvious precipitating cause. He typically runs hemoglobins in the 5 g range. I have advised not transfusing unless hemoglobin falls lower than this for the reasons noted above. I last saw him during a January 2015 admission. He has had 3 subsequent admissions including the current admission since that time. He developed a infection of his right subclavian Port-A-Cath which had to be removed. He now has a PICC catheter in the right antecubital vein. He was admitted here on April 20. He had a low-grade cough. Intermittent low-grade fevers. No shaking chills. A very unusual bacterium was cultured from one of 2 blood cultures, something I have never heard of: Burkholderia pickettii, and my impression is that this is likely a contaminant and not a real finding.  He is having typical crisis pain in his chest and legs. He was febrile to 102.8 on admission. Now afebrile. Oxygen saturations have been greater than or equal to 98%.  Exam: He appears comfortable at rest Patient Vitals for the past 24 hrs:  BP Temp Temp src Pulse Resp SpO2  01/16/14 0940 104/63 mmHg 98 F (36.7 C) Oral 86 12 100 %  01/16/14 0733 - - - - 14 98 %   01/16/14 0648 - - - - 16 100 %  01/16/14 0500 - 99.9 F (37.7 C) Oral 108 16 100 %  01/16/14 0417 - - - - 20 -  01/16/14 0222 112/74 mmHg 102.8 F (39.3 C) Oral 128 18 100 %  01/16/14 0110 119/66 mmHg 100.7 F (38.2 C) Oral 124 8 100 %  01/16/14 0032 - - - - 18 100 %  01/15/14 2156 - - - - 10 100 %  01/15/14 2016 111/67 mmHg 99.6 F (37.6 C) Oral 110 18 100 %  01/15/14 2000 - - - - 12 100 %  01/15/14 1535 101/62 mmHg - - - - -  01/15/14 1408 85/43 mmHg 98.2 F (36.8 C) Oral 96 16 97 %    Head and neck: Oropharynx no erythema or exudate, neck full range of motion Pulmonary: Lungs clear to auscultation resonant to percussion with some minimal rales at the right base which clear on deep inspiration Cardiac: Regular rhythm no murmur, trace of peripheral edema Abdomen: Soft, nontender, no mass, no organomegaly Extremities: Acyanotic Vascular: No carotid bruits. Dorsalis pedis pulses 2+ symmetric, PICC catheter right brachiocephalic area Neurologic: He is alert and oriented, pupils equal round reactive, cranial nerves grossly normal, motor strength 5 over 5 all extremities, reflexes 1+ symmetric.  Pertinent lab: Admission hemoglobin 6.6, white count 20,500, platelets 540,000, white count differential with 67% neutrophils, 20% lymphocytes. Hemoglobin today 5.1, white count 14,000 with 66% neutrophils, platelets 306,000. Bilirubin on admission 0.9 peak value 1.8 on May 2 value today 1.0 LDH on  admission 518 compare with previous baseline on April 23 of 294, peak value 603 on April 30, today's value 302 Reticulocyte count not recorded for this admission  Impression: #1. Typical sickle crisis By lab parameters, this crisis is not appear to be severe and already appears to be resolving. Although he had a high fever on admission, the unusual organism isolated from one of 2 blood cultures suggest that this is a contaminant. I would get an infectious disease opinion on type and duration of  antibiotics and whether or not we need to be concerned about this blood culture.  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 Premedicate as outlined above for all transfusions. We are in a difficult situation. If he does need a blood transfusion, alert the blood bank so that they can get best crossmatched units. I would transfuse 2 units. In view of his low starting hemoglobin, we may be able to get a reasonable rise in his hemoglobin A2 level with 2-3 units. I would check a hemoglobin electrophoresis following transfusion to assess this.  In view of his past history, he needs to be monitored for delayed hemolytic transfusion reactions.  He still young man so we need to explore other options for the future. I am going to try to arrange a consultation with one of the bone marrow transplant physicians at [facility] who works with sickle cell patients. Mr. Rappaport only has one sibling who also has sickle cell disease and both of his parents traits so no family member would be a suitable donor. We would have to get him into a national and international transplant registry to find a match which is always more difficult for  minority ethnic groups. He will followup with me at the most common internal medicine clinic after discharge.

## 2014-01-16 NOTE — Progress Notes (Addendum)
MD notified of temp of 102. Tylenol given, blood cx were drawn yesterday.   Motrin and 500 cc bolus ordered, as this has helped him in the past.

## 2014-01-16 NOTE — Progress Notes (Signed)
24 hour PCA diluadid usage:  Total Diluadid:  17.74 mg  Total Demands:  91  Total Delivered:  89

## 2014-01-16 NOTE — Progress Notes (Signed)
Pts temp now decreased to 99.9.

## 2014-01-16 NOTE — Progress Notes (Signed)
Attending notified of 102.8 F temp, new orders given.

## 2014-01-16 NOTE — Progress Notes (Addendum)
CRITICAL VALUE ALERT  Critical value received:  Blood culture aerobic bottle gram variable rods  Date of notification:  01-16-14  Time of notification:  1433  Critical value read back: Yes  Nurse who received alert:  Henrietta Dine  MD notified (1st page):  Dr. Jonelle Sidle  Time of first page:  1444  MD notified (2nd page):  Time of second page:  Responding MD:  Dr. Jonelle Sidle  Time MD responded:  [phone]

## 2014-01-16 NOTE — Progress Notes (Signed)
Subjective: 28 year old gentleman with known history sickle cell disease admitted with sickle cell anemia as well as febrile illness. 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 He's had fever with leukocytosis. His muscle some blood culture showed gram variable bacteria. Chest x-ray showed no upper pneumonia a urinalysis showed no apparent infection. He is on Dilaudid PCA and has used 47.3 mg in the last 24 hours with 62 demands of 57 deliveries. He complains of 7/10 pain today. Denied any nausea or vomiting or diarrhea. Patient is being followed by his hematologist Dr. Beryle Beams.  Objective: Vital signs in last 24 hours: Temp:  [98 F (36.7 C)-102.8 F (39.3 C)] 98.3 F (36.8 C) (05/05 1400) Pulse Rate:  [86-128] 88 (05/05 1400) Resp:  [8-20] 11 (05/05 1200) BP: (101-119)/(62-74) 101/68 mmHg (05/05 1400) SpO2:  [98 %-100 %] 100 % (05/05 1400) Weight change:  Last BM Date: 01/15/14  Intake/Output from previous day: 05/04 0701 - 05/05 0700 In: 1080 [P.O.:1080] Out: 800 [Urine:800] Intake/Output this shift:    General appearance: alert, cooperative and no distress Eyes: conjunctivae/corneas clear. PERRL, EOM's intact. Fundi benign. Neck: no adenopathy, no carotid bruit, no JVD, supple, symmetrical, trachea midline and thyroid not enlarged, symmetric, no tenderness/mass/nodules Back: symmetric, no curvature. ROM normal. No CVA tenderness. Resp: clear to auscultation bilaterally Chest wall: no tenderness Cardio: regular rate and rhythm, S1, S2 normal, no murmur, click, rub or gallop GI: soft, non-tender; bowel sounds normal; no masses,  no organomegaly Extremities: extremities normal, atraumatic, no cyanosis or edema Pulses: 2+ and symmetric Skin: Skin color, texture, turgor normal. No rashes or lesions Neurologic: Grossly normal  Lab Results:  Recent Labs  01/15/14 0500 01/16/14 0642  WBC 13.3* 14.3*  HGB 5.5* 5.1*  HCT 16.6* 15.1*   PLT 266 306   BMET  Recent Labs  01/15/14 0500 01/16/14 0642  NA 136* 138  K 4.6 4.9  CL 97 99  CO2 28 27  GLUCOSE 108* 122*  BUN 19 16  CREATININE 0.95 1.15  CALCIUM 8.9 9.0    Studies/Results: No results found.  Medications: I have reviewed the patient's current medications.  Assessment/Plan: A 28 year old gentleman with sickle cell SS disease here with crises as well as severe hemolytic anemia and positive blood cultures.  #1 sickle cell hemolytic anemia: Hemoglobin has dropped to 5.1. Patient is not to be transfused unless his hemoglobin is less than 5. We will continue per hematology.  #2 positive blood culture: This could be a contaminant all could be something real with his history of fever or leukocytosis. We will continue empiric antibiotics and await identification and sensitivity of the bacteria.  #3 hypoxemia: Probably multifactorial. Continue with oxygen and monitoring. Empiric antibiotics.  #4 leukocytosis: White blood cell count has increased today. This may reflect infection or vaso-occlusive crisis.  #5 hyponatremia: This seems to have resolved today.  #6 constipation: Patient had one bowel movement today. Continue to monitor.  LOS: 15 days   Dennis Bernard 01/16/2014, 2:44 PM

## 2014-01-17 LAB — COMPREHENSIVE METABOLIC PANEL
ALT: 24 U/L (ref 0–53)
AST: 22 U/L (ref 0–37)
Albumin: 2.8 g/dL — ABNORMAL LOW (ref 3.5–5.2)
Alkaline Phosphatase: 243 U/L — ABNORMAL HIGH (ref 39–117)
BILIRUBIN TOTAL: 1 mg/dL (ref 0.3–1.2)
BUN: 14 mg/dL (ref 6–23)
CHLORIDE: 98 meq/L (ref 96–112)
CO2: 27 mEq/L (ref 19–32)
CREATININE: 1.1 mg/dL (ref 0.50–1.35)
Calcium: 8.7 mg/dL (ref 8.4–10.5)
GFR calc non Af Amer: 89 mL/min — ABNORMAL LOW (ref >90)
Glucose, Bld: 102 mg/dL — ABNORMAL HIGH (ref 70–99)
Potassium: 5.3 mEq/L (ref 3.7–5.3)
Sodium: 135 mEq/L — ABNORMAL LOW (ref 137–147)
Total Protein: 6.5 g/dL (ref 6.0–8.3)

## 2014-01-17 LAB — CBC WITH DIFFERENTIAL/PLATELET
BASOS ABS: 0 10*3/uL (ref 0.0–0.1)
Basophils Relative: 0 % (ref 0–1)
Eosinophils Absolute: 0.2 10*3/uL (ref 0.0–0.7)
Eosinophils Relative: 1 % (ref 0–5)
HCT: 15 % — ABNORMAL LOW (ref 39.0–52.0)
Hemoglobin: 4.9 g/dL — CL (ref 13.0–17.0)
LYMPHS ABS: 3.5 10*3/uL (ref 0.7–4.0)
Lymphocytes Relative: 23 % (ref 12–46)
MCH: 30.8 pg (ref 26.0–34.0)
MCHC: 32.7 g/dL (ref 30.0–36.0)
MCV: 94.3 fL (ref 78.0–100.0)
MONO ABS: 0.9 10*3/uL (ref 0.1–1.0)
Monocytes Relative: 6 % (ref 3–12)
Neutro Abs: 10.7 10*3/uL — ABNORMAL HIGH (ref 1.7–7.7)
Neutrophils Relative %: 70 % (ref 43–77)
PLATELETS: 339 10*3/uL (ref 150–400)
RBC: 1.59 MIL/uL — ABNORMAL LOW (ref 4.22–5.81)
RDW: 21.4 % — AB (ref 11.5–15.5)
WBC: 15.3 10*3/uL — ABNORMAL HIGH (ref 4.0–10.5)

## 2014-01-17 LAB — PREPARE RBC (CROSSMATCH)

## 2014-01-17 MED ORDER — METHYLPREDNISOLONE SODIUM SUCC 40 MG IJ SOLR
40.0000 mg | Freq: Once | INTRAMUSCULAR | Status: AC
  Administered 2014-01-17: 40 mg via INTRAVENOUS
  Filled 2014-01-17: qty 1

## 2014-01-17 MED ORDER — DIPHENHYDRAMINE HCL 50 MG/ML IJ SOLN
25.0000 mg | Freq: Once | INTRAMUSCULAR | Status: AC
  Administered 2014-01-17: 25 mg via INTRAVENOUS
  Filled 2014-01-17: qty 1

## 2014-01-17 NOTE — Accreditation Note (Signed)
24 hour PCA total:  Received = 5.37, Demands = 28, Delivered = 27

## 2014-01-17 NOTE — Progress Notes (Signed)
PT Cancellation Note  Patient Details Name: Dennis Bernard MRN: [id number] DOB: [birth year]   Cancelled Treatment:    Reason Eval/Treat Not Completed: Medical issues which prohibited therapy   Shella Maxim [facility] 01/17/2014, 7:53 AM Tresa Endo PT [phone]

## 2014-01-17 NOTE — Progress Notes (Signed)
Subjective: Patient is having temprature at night with T max of 102.1 last night. Has positive blood culture with gram variable organism. His hemoglobin has dropped to 4.9 today. Has used 17.74mg  od Dilaudid in the last 24 hours with 91 demands and 89 deliveries. Has been having a 7/10 pain. No fever, no NVD.  Objective: Vital signs in last 24 hours: Temp:  [97.5 F (36.4 C)-102 F (38.9 C)] 97.5 F (36.4 C) (05/06 0947) Pulse Rate:  [68-118] 83 (05/06 0947) Resp:  [12-18] 13 (05/06 1556) BP: (95-118)/(62-72) 99/63 mmHg (05/06 0947) SpO2:  [97 %-100 %] 100 % (05/06 1556) FiO2 (%):  [14 %-15 %] 14 % (05/06 0324) Weight change:  Last BM Date: 01/15/14  Intake/Output from previous day: 05/05 0701 - 05/06 0700 In: 360.9 [P.O.:360; I.V.:0.9] Out: 1600 [Urine:1600] Intake/Output this shift: Total I/O In: -  Out: 800 [Urine:800]  General appearance: alert, cooperative and no distress Eyes: conjunctivae/corneas clear. PERRL, EOM's intact. Fundi benign. Neck: no adenopathy, no carotid bruit, no JVD, supple, symmetrical, trachea midline and thyroid not enlarged, symmetric, no tenderness/mass/nodules Back: symmetric, no curvature. ROM normal. No CVA tenderness. Resp: clear to auscultation bilaterally Chest wall: no tenderness Cardio: regular rate and rhythm, S1, S2 normal, no murmur, click, rub or gallop GI: soft, non-tender; bowel sounds normal; no masses,  no organomegaly Extremities: extremities normal, atraumatic, no cyanosis or edema Pulses: 2+ and symmetric Skin: Skin color, texture, turgor normal. No rashes or lesions Neurologic: Grossly normal  Lab Results:  Recent Labs  01/16/14 0642 01/17/14 0335  WBC 14.3* 15.3*  HGB 5.1* 4.9*  HCT 15.1* 15.0*  PLT 306 339   BMET  Recent Labs  01/16/14 0642 01/17/14 0335  NA 138 135*  K 4.9 5.3  CL 99 98  CO2 27 27  GLUCOSE 122* 102*  BUN 16 14  CREATININE 1.15 1.10  CALCIUM 9.0 8.7    Studies/Results: No results  found.  Medications: I have reviewed the patient's current medications.  Assessment/Plan: A 28 year old gentleman with sickle cell SS disease here with crises as well as severe hemolytic anemia and positive blood cultures.  #1 sickle cell hemolytic anemia:Hemoglobin is dropping again below 5.0 now. Per Dr Azucena Freed recommendation, we will transfuse 2 units of PRBC with pre-medication with Medrol and Benadryl. We will continue to monitor for his delayed transfusion reaction.  #2 positive blood culture: Burkholderia Picketti previously isolated. Usually a contaminant but in this case on antibiotics due to his frequent hospitalizations. Recent blood culture identification pending. We will follow results.  #3 hypoxemia: Probably multifactorial. Much improved.  #4 leukocytosis: White blood cell count has been increasing and patient having fever. We will involve ID if fever curve is not dropping. Antibiotics fever may also be a factor.  #5 hyponatremia: Mild, continue to give 1/2 NS..  #6 constipation: Resolving. Continue laxatives.    LOS: 16 days   Elwyn Reach 01/17/2014, 4:10 PM

## 2014-01-18 LAB — CBC WITH DIFFERENTIAL/PLATELET
BASOS PCT: 0 % (ref 0–1)
Basophils Absolute: 0 10*3/uL (ref 0.0–0.1)
Eosinophils Absolute: 0 10*3/uL (ref 0.0–0.7)
Eosinophils Relative: 0 % (ref 0–5)
HCT: 23.6 % — ABNORMAL LOW (ref 39.0–52.0)
Hemoglobin: 7.7 g/dL — ABNORMAL LOW (ref 13.0–17.0)
LYMPHS PCT: 7 % — AB (ref 12–46)
Lymphs Abs: 0.8 10*3/uL (ref 0.7–4.0)
MCH: 29.8 pg (ref 26.0–34.0)
MCHC: 32.6 g/dL (ref 30.0–36.0)
MCV: 91.5 fL (ref 78.0–100.0)
Monocytes Absolute: 0 10*3/uL — ABNORMAL LOW (ref 0.1–1.0)
Monocytes Relative: 0 % — ABNORMAL LOW (ref 3–12)
NEUTROS PCT: 93 % — AB (ref 43–77)
Neutro Abs: 10.6 10*3/uL — ABNORMAL HIGH (ref 1.7–7.7)
PLATELETS: 437 10*3/uL — AB (ref 150–400)
RBC: 2.58 MIL/uL — ABNORMAL LOW (ref 4.22–5.81)
RDW: 20.3 % — AB (ref 11.5–15.5)
WBC: 11.4 10*3/uL — AB (ref 4.0–10.5)

## 2014-01-18 LAB — RETICULOCYTES
RBC.: 2.57 MIL/uL — ABNORMAL LOW (ref 4.22–5.81)
RETIC CT PCT: 8.5 % — AB (ref 0.4–3.1)
Retic Count, Absolute: 218.5 10*3/uL — ABNORMAL HIGH (ref 19.0–186.0)

## 2014-01-18 LAB — COMPREHENSIVE METABOLIC PANEL
ALT: 28 U/L (ref 0–53)
AST: 26 U/L (ref 0–37)
Albumin: 3.2 g/dL — ABNORMAL LOW (ref 3.5–5.2)
Alkaline Phosphatase: 283 U/L — ABNORMAL HIGH (ref 39–117)
BILIRUBIN TOTAL: 1.2 mg/dL (ref 0.3–1.2)
BUN: 14 mg/dL (ref 6–23)
CHLORIDE: 100 meq/L (ref 96–112)
CO2: 25 meq/L (ref 19–32)
Calcium: 9.4 mg/dL (ref 8.4–10.5)
Creatinine, Ser: 0.92 mg/dL (ref 0.50–1.35)
GFR calc Af Amer: >90 mL/min (ref >90)
GFR calc non Af Amer: >90 mL/min (ref >90)
Glucose, Bld: 168 mg/dL — ABNORMAL HIGH (ref 70–99)
Potassium: 5.4 mEq/L — ABNORMAL HIGH (ref 3.7–5.3)
SODIUM: 137 meq/L (ref 137–147)
Total Protein: 7.2 g/dL (ref 6.0–8.3)

## 2014-01-18 LAB — LACTATE DEHYDROGENASE: LDH: 320 U/L — AB (ref 94–250)

## 2014-01-18 MED ORDER — BOOST PLUS PO LIQD
237.0000 mL | Freq: Three times a day (TID) | ORAL | Status: DC
  Administered 2014-01-19 – 2014-01-20 (×4): 237 mL via ORAL
  Filled 2014-01-18 (×7): qty 237

## 2014-01-18 MED ORDER — DIPHENHYDRAMINE HCL 50 MG/ML IJ SOLN
25.0000 mg | Freq: Once | INTRAMUSCULAR | Status: AC
  Administered 2014-01-18: 25 mg via INTRAVENOUS
  Filled 2014-01-18: qty 1

## 2014-01-18 MED ORDER — METHYLPREDNISOLONE SODIUM SUCC 40 MG IJ SOLR
40.0000 mg | Freq: Once | INTRAMUSCULAR | Status: AC
  Administered 2014-01-18: 40 mg via INTRAVENOUS
  Filled 2014-01-18: qty 1

## 2014-01-18 MED ORDER — HYDROXYZINE HCL 25 MG PO TABS
25.0000 mg | ORAL_TABLET | Freq: Once | ORAL | Status: AC
  Administered 2014-01-18: 25 mg via ORAL
  Filled 2014-01-18: qty 1

## 2014-01-18 MED ORDER — HYDROXYUREA 500 MG PO CAPS
1500.0000 mg | ORAL_CAPSULE | Freq: Every day | ORAL | Status: DC
  Administered 2014-01-18 – 2014-01-20 (×3): 1500 mg via ORAL
  Filled 2014-01-18 (×3): qty 3

## 2014-01-18 NOTE — Progress Notes (Signed)
Patient ID: Dennis Bernard, male   DOB: [birth year], 28 y.o.   MRN: [id number] Essentially afebrile since last temp spike on 5/5 Day # 6 antibiotics Hb fell to 4.9, 2 units transfused without incident: Hb 7.7 this AM Pain level down to7 No new complaints Exam: Afebrile Pharynx no exudate Heart regular rhythm no murmur Abdomen soft Extremities: R PICC cath; no edema or calf tenderness Impression: #1.Sickle crisis - slowly resolving I would resume Hydrea.  part of the way Hydrea works is by lowering the WBC which decreases WBC adhesion to vascular endothelium. I will check post transfusion Hb electrophoresis to see if he has a significant increase in Hb A  #2. Sepsis with unusual organism vs contaminant Please get ID opinion

## 2014-01-18 NOTE — Progress Notes (Signed)
NUTRITION FOLLOW UP  Intervention:   -Modify supplement to Boost Plus TID -Encouraged intake of [employer] Instant Breakfast for additional supplement alternatives -Will continue to monitor  Nutrition Dx:   Inadequate oral intake related to poor appetite as evidenced by pt report-ongoing   Goal:   Pt to meet >/= 90% of their estimated nutrition needs - not met   Monitor:   Weights, labs, intake   Assessment:   5/01: Pt with history of sickle cell anemia presents to the ED with a few days of pain and shortness of breath and sickle cell crisis.  Met with pt who reports his appetite has been down for the past week with pt consuming only 1 meal/day of things like chicken nuggets and fries. Denies any changes in weight. Did not order anything to eat today. Interested in getting Ensure Complete during admission.   5/07: -Pt reported consuming one meal/day, usually breakfast, and consumes Ensure Complete for lunch and dinner -Continues to have pain, fevers, and anxiety, all of which likely contributing overall decreased intake -Tolerating taste of supplement but was open to trying alternative supplements.  -Discussed addition of Boost supplement vs Ensure, which pt agreed upon. Also recommended [employer] Breakfast to order with meals and to implement as snack PRN -Constipation improving with laxatives. Receiving Docusate BID. This will also likely assist in improving appetite  Height: Ht Readings from Last 1 Encounters:  01/12/14 5' 9"  (1.753 m)    Weight Status:   Wt Readings from Last 1 Encounters:  01/12/14 169 lb 1.6 oz (76.703 kg)    Re-estimated needs:  Kcal: 1950-2150  Protein: 90-110g  Fluid: 1.9-2.1L/day   Skin: WDL  Diet Order: General   Intake/Output Summary (Last 24 hours) at 01/18/14 1452 Last data filed at 01/18/14 1224  Gross per 24 hour  Intake 2062.07 ml  Output   3175 ml  Net -1112.93 ml    Last BM: 5/04   Labs:   Recent Labs Lab  01/16/14 0642 01/17/14 0335 01/18/14 0630  NA 138 135* 137  K 4.9 5.3 5.4*  CL 99 98 100  CO2 27 27 25   BUN 16 14 14   CREATININE 1.15 1.10 0.92  CALCIUM 9.0 8.7 9.4  GLUCOSE 122* 102* 168*    CBG (last 3)  No results found for this basename: GLUCAP,  in the last 72 hours  Scheduled Meds: . amitriptyline  25 mg Oral QHS  . folic acid  1 mg Oral q morning - 10a  . heparin  5,000 Units Subcutaneous 3 times per day  . HYDROmorphone PCA 0.3 mg/mL   Intravenous 6 times per day  . HYDROmorphone  4 mg Oral Q4H  . hydroxyurea  1,500 mg Oral Daily  . lactose free nutrition  237 mL Oral TID WC  . morphine  60 mg Oral 3 times per day  . multivitamin with minerals  1 tablet Oral Daily  . pantoprazole  40 mg Oral Daily  . saccharomyces boulardii  250 mg Oral BID  . senna-docusate  1 tablet Oral BID  . sodium chloride  10-40 mL Intracatheter Q12H  . sulfamethoxazole-trimethoprim  1 tablet Oral Q12H    Continuous Infusions: . sodium chloride 10 mL/hr at 01/16/14 [place] LDN Clinical Dietitian [phone]

## 2014-01-18 NOTE — Consult Note (Signed)
[facility] for Infectious Disease     Reason for Consult: bacteremia    Referring Physician: Dr. Rodena Piety  Principal Problem:   Sickle cell crisis Active Problems:   Chronic pain   Hyponatremia   . amitriptyline  25 mg Oral QHS  . folic acid  1 mg Oral q morning - 10a  . heparin  5,000 Units Subcutaneous 3 times per day  . HYDROmorphone PCA 0.3 mg/mL   Intravenous 6 times per day  . HYDROmorphone  4 mg Oral Q4H  . hydroxyurea  1,500 mg Oral Daily  . lactose free nutrition  237 mL Oral TID WC  . morphine  60 mg Oral 3 times per day  . multivitamin with minerals  1 tablet Oral Daily  . pantoprazole  40 mg Oral Daily  . saccharomyces boulardii  250 mg Oral BID  . senna-docusate  1 tablet Oral BID  . sodium chloride  10-40 mL Intracatheter Q12H  . sulfamethoxazole-trimethoprim  1 tablet Oral Q12H    Recommendations: Continue with Bactrim Will await GVR  Repeat blood cultures tomorrow   Assessment: He has two organisms growing out, the first Burkholderia pickettii is unusual and found in soil, maybe mouth, and the other GVR may be Bacillus but is different than the first organism.  The first was from a blood culture prior to placement of the picc line and the other one has been since.  Not sure if he is manipulating the line?  Noted that empty syringes found in room earlier in the hospital stay.  Regardless, will just need to assure clearance and monitor on Bactrim.    Antibiotics: bactrim  HPI: Dennis Bernard is a 28 y.o. male with Sickle cell disease who initially presented 4/20 with pain and SOB with SSC.  Then, on 5/4 developed fever to 101.7 and 102 on 5/5.  Also with intermittent fever to 101 on 4/29-30.  Blood cultures from 4/21 did grow out Burkholderia picketti.  He had picc line placed on 4/21 which has been in since.  He had an episode of Enterobacter sepsis at [facility] in February 2015 and treated through (kept Bernard a cath).  He was also noted to have an abscess  on his arm.  He was then hospitalized here Feb 2015 for HCAP and SSC, then in [facility] for [place].  At some point as well, he had his Bernard-a-cath removed, he tells me a few weeks ago.     Review of Systems: A comprehensive review of systems was negative.  Past Medical History  Diagnosis Date  . Sickle cell disease   . Avascular necrosis of femur head, right   . H/O allergic rhinitis   . H/O hypokalemia   . Chronic pain syndrome   . H/O wheezing     with colds  . 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 Smoking status: Never Smoker   . Smokeless tobacco: Never Used  . Alcohol Use: No    Family History  Problem Relation Age of Onset  . Sickle cell anemia Brother   . Sickle cell trait Father   . Sickle cell trait Mother   . Diabetes Mellitus II Mother   . Sickle cell anemia Paternal Uncle   . Asthma Neg Hx   . Allergic rhinitis Neg Hx    No Known Allergies  OBJECTIVE: Blood pressure 110/75, pulse 88, temperature 98.3 F (36.8 C), temperature source Oral, resp. rate 17, height  5\' 9"  (1.753 m), weight 169 lb 1.6 oz (76.703 kg), SpO2 100.00%. General: awkae, alert, nad Skin: no rashes Lungs: CTA B Cor: RRR without m/r/g Abdomen: soft, nt, nd, +bs Ext: no edema  Microbiology: Recent Results (from the past 240 hour(s))  URINE CULTURE     Status: None   Collection Time    01/11/14 12:04 PM      Result Value Ref Range Status   Specimen Description URINE, CATHETERIZED   Final   Special Requests NONE   Final   Culture  Setup Time     Final   Value: 01/11/2014 14:48     Performed at [facility]     Final   Value: NO GROWTH     Performed at [facility]   Culture     Final   Value: NO GROWTH     Performed at [facility]   Report Status 01/12/2014 FINAL   Final  CULTURE, BLOOD (ROUTINE X 2)     Status: None   Collection Time    01/12/14 10:25 AM      Result Value Ref Range Status    Specimen Description BLOOD LEFT HAND   Final   Special Requests BOTTLES DRAWN AEROBIC ONLY 2CC   Final   Culture  Setup Time     Final   Value: 01/12/2014 14:40     Performed at [facility]   Culture     Final   Value: GRAM VARIABLE ROD     Note: Gram Stain Report Called to,Read Back By and Verified With: Henrietta Dine 01/16/14 1435 BY SMITHERSJ     Performed at [facility] Status PENDING   Incomplete    Thayer Headings, [employer] [facility] [email or web address] [phone] pager  [phone] cell 01/18/2014, 4:21 PM

## 2014-01-18 NOTE — Progress Notes (Signed)
SICKLE CELL SERVICE PROGRESS NOTE  Isack Lavalley QIO:[id number] DOB: May 30, 1986 DOA: 01/01/2014 PCP: Wilhelmina Mcardle, MD  Assessment/Plan: Principal Problem:   Sickle cell crisis Active Problems:   Chronic pain  1. Hb SS with crisis: Pt on oral medication and using very little IV on the PCA. Will D/C PCA and schedule short acting oral analgesics with dose for breakthrough. Will re-evaluate tomorrow. 2. Anemia: Pt had a decrease in Hb down to 4.9. He was transfused 2 units of PRBC's yesterday and Hb is 7.7 today. He received a dose of Solumedrol preceding transfusion. Will continue to monitor for late and delayed transfusion reactions. 3. Leukocytosis: Improved. Likely related to Bacteremia. I will ask ID to give an official consult as pt still spiking fevers and in light of the Gram Variable Rod. I am unsure of the source as the initially identified pathogen is [place] bourne pathogen. Will ID from Blood Cultures from 4/1. Also will consider repeating blood cultures to assure clearance-discuss with ID. 4. Tachycardia:Resolved. 5. Hypotension/ Sepsis: In retrospect was likely due to GNR bacteremia. Now resolved. 6.  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 Pt was initially treated with Levaquin and still spiking fevers. Antibiotics were switched to Bactrim and pt now afebrile . 7. Fever: Last fever on 5/5. Will continue to monitor. 8. Psychosocial: Pt has had much difficulty during this admission with suspicious behaviors surrounding his opiate analgesics. The patient admits to having difficulty with telling the truth as he wants to be endeared to his conversant. He admits to issues of trust and has fears of being stereotyped. I have spoken with patient at length on many occasions about the need for therapy to assist him in addressing his Psychosocial issues. He has refused in the past but now states that he would be willing to  follow through on an outpatient basis. Mr Brannen has misrepresented information and will often mischaracterize his pain to achieve secondary gain so that it is difficult to assess if the patient is actually having pain adversely impacting his function, or if he just wants to obtain medication. The patient also does not have a prescriber for his analgesics as he has burnt his bridges with multiple providers and thus he has much incentive for mis characterizing his pain as it is all subjective. Mr. Pistilli has expressed the lack of support from his family because of religious differences. If he is willing to enter Therapy and follow through with seeing a substance abuse counselor, I will abe willing to give him another try as a patient in the practice as I am afraid that without consistent medical care, this patient is at increased mortality and morbidity risk.   Code Status: Full Code  Family Communication: N/A Disposition Plan: Not yet ready for discharge  Leana Gamer  Pager [phone]. If 7PM-7AM, please contact night-coverage.  01/18/2014, 1:04 PM  LOS: 17 days   Brief narrative: Miracle Criado is a 28 y.o. male with istory of sickle cell anemia presents to the ED with a few days of pain and shortness of breath and sickle cell crisis. Patient has had pain in his chest as well as his shoulder. Patient states that he has noted the pain to be sharp and is typical of his sickle crisis. He states last admission was in February sometime for this. He has been taking his pain medications regularly lately due to increase in his symptoms. No cough noted. No hemoptysis is  noted. Has not had any syncope. He has noted some fevers though he is not entirely sure about this. He has no nausea and no vointing. Denies having any abdominal pain   Consultants:  None  Procedures:  None  Antibiotics:  None  HPI/Subjective: Pt states that pain is improved. Pt able to ambulate today. He rates the  pain as 5/10 , sharp and non-radiating  in nature.  Objective: Filed Vitals:   01/18/14 0448 01/18/14 0752 01/18/14 0932 01/18/14 1224  BP: 110/70  98/68   Pulse: 80  88   Temp: 97.7 F (36.5 C)  97.7 F (36.5 C)   TempSrc: Oral  Oral   Resp: 18 13 15 16   Height:      Weight:      SpO2: 100% 100% 100% 100%   Weight change:   Intake/Output Summary (Last 24 hours) at 01/18/14 1304 Last data filed at 01/18/14 1224  Gross per 24 hour  Intake 2062.07 ml  Output   3175 ml  Net -1112.93 ml    General: Alert, awake, oriented x3, in no acute distress. Pt resting comfortably in bed. HEENT: [place]/AT PEERL, EOMI, anicteric Heart: Regular rate and rhythm, without murmurs, rubs, gallops.  Lungs: Clear to auscultation, no wheezing or rhonchi noted.  Abdomen: Soft, nontender, nondistended, positive bowel sounds, no masses no hepatosplenomegaly noted.  Neuro: No focal neurological deficits noted cranial nerves II through XII grossly intact.  Strength normal in bilateral upper and lower extremities. Musculoskeletal: No warm swelling or erythema around joints, no spinal tenderness noted. Psychiatric: Patient alert and oriented x3, good insight and cognition, good recent to remote recall. Lymph node survey: No cervical axillary or inguinal lymphadenopathy noted.   Data Reviewed: Basic Metabolic Panel:  Recent Labs Lab 01/14/14 0500 01/15/14 0500 01/16/14 0642 01/17/14 0335 01/18/14 0630  NA 135* 136* 138 135* 137  K 4.4 4.6 4.9 5.3 5.4*  CL 97 97 99 98 100  CO2 27 28 27 27 25   GLUCOSE 107* 108* 122* 102* 168*  BUN 16 19 16 14 14   CREATININE 0.84 0.95 1.15 1.10 0.92  CALCIUM 9.4 8.9 9.0 8.7 9.4   Liver Function Tests:  Recent Labs Lab 01/14/14 0500 01/15/14 0500 01/16/14 0642 01/17/14 0335 01/18/14 0630  AST 32 25 22 22 26   ALT 33 30 26 24 28   ALKPHOS 229* 252* 261* 243* 283*  BILITOT 1.6* 1.8* 1.0 1.0 1.2  PROT 7.1 7.0 6.9 6.5 7.2  ALBUMIN 3.1* 3.0* 2.9* 2.8* 3.2*   No  results found for this basename: LIPASE, AMYLASE,  in the last 168 hours No results found for this basename: AMMONIA,  in the last 168 hours CBC:  Recent Labs Lab 01/14/14 0500 01/15/14 0500 01/16/14 0642 01/17/14 0335 01/18/14 0630  WBC 13.9* 13.3* 14.3* 15.3* 11.4*  NEUTROABS  --  8.9* 9.5* 10.7* 10.6*  HGB 6.2* 5.5* 5.1* 4.9* 7.7*  HCT 17.7* 16.6* 15.1* 15.0* 23.6*  MCV 91.2 92.2 91.5 94.3 91.5  PLT 271 266 306 339 437*   Cardiac Enzymes: No results found for this basename: CKTOTAL, CKMB, CKMBINDEX, TROPONINI,  in the last 168 hours BNP (last 3 results)  Recent Labs  10/20/13 1935 10/22/13 0300  PROBNP 471.8* 160.5*   CBG: No results found for this basename: GLUCAP,  in the last 168 hours  Recent Results (from the past 240 hour(s))  URINE CULTURE     Status: None   Collection Time    01/11/14 12:04 PM  Result Value Ref Range Status   Specimen Description URINE, CATHETERIZED   Final   Special Requests NONE   Final   Culture  Setup Time     Final   Value: 01/11/2014 14:48     Performed at [place]     Final   Value: NO GROWTH     Performed at [facility]   Culture     Final   Value: NO GROWTH     Performed at [facility]   Report Status 01/12/2014 FINAL   Final  CULTURE, BLOOD (ROUTINE X 2)     Status: None   Collection Time    01/12/14 10:25 AM      Result Value Ref Range Status   Specimen Description BLOOD LEFT HAND   Final   Special Requests BOTTLES DRAWN AEROBIC ONLY 2CC   Final   Culture  Setup Time     Final   Value: 01/12/2014 14:40     Performed at [facility]   Culture     Final   Value: GRAM VARIABLE ROD     Note: Gram Stain Report Called to,Read Back By and Verified With: Henrietta Dine 01/16/14 1435 BY SMITHERSJ     Performed at [facility]   Report Status PENDING   Incomplete     Studies: Dg Chest 2 View  (if Recent History Of Cough Or Chest Pain)  01/01/2014   CLINICAL  DATA:  Chest tightness, cough and fever  EXAM: CHEST  2 VIEW  COMPARISON:  Prior chest x-ray 11/06/2013  FINDINGS: Stable cardiac and mediastinal contours. Very low inspiratory volumes. Similar appearance of mild diffuse interstitial prominence. The central bronchial wall thickening similar compared to prior. No focal airspace consolidation. No pleural effusion or pneumothorax. Surgical changes of prior left shoulder arthroplasty. No acute osseous abnormality.  IMPRESSION: Stable chest x-ray without evidence of acute cardiopulmonary disease.   Electronically Signed   By: Jacqulynn Cadet M.D.   On: 01/01/2014 18:37    Scheduled Meds: . amitriptyline  25 mg Oral QHS  . feeding supplement (ENSURE COMPLETE)  237 mL Oral TID BM  . folic acid  1 mg Oral q morning - 10a  . heparin  5,000 Units Subcutaneous 3 times per day  . HYDROmorphone PCA 0.3 mg/mL   Intravenous 6 times per day  . HYDROmorphone  4 mg Oral Q4H  . morphine  60 mg Oral 3 times per day  . multivitamin with minerals  1 tablet Oral Daily  . pantoprazole  40 mg Oral Daily  . saccharomyces boulardii  250 mg Oral BID  . senna-docusate  1 tablet Oral BID  . sodium chloride  10-40 mL Intracatheter Q12H  . sulfamethoxazole-trimethoprim  1 tablet Oral Q12H   Continuous Infusions: . sodium chloride 10 mL/hr at 01/16/14 1627    Total time spent 45 minutes Leana Gamer

## 2014-01-18 NOTE — Progress Notes (Addendum)
Physical Therapy Treatment and Discharge from acute PT Patient Details Name: Dennis Bernard MRN: [id number] DOB: 07/25/1986 Today's Date: 01/18/2014    History of Present Illness      PT Comments    Pt reports feeling better after receiving PRBCs.  Pt slightly SOB during ambulation however tolerated ambulation in hallway well.  Pt agreeable to ambulate with nsg staff during acute stay and agreed for PT to discharge him at this time as his mobility has significantly improved since evaluation.   Follow Up Recommendations  No PT follow up     Equipment Recommendations  Rolling walker with 5" wheels (not needed today however may need for future)    Recommendations for Other Services       Precautions / Restrictions Precautions Precautions: Fall Restrictions Weight Bearing Restrictions: No    Mobility  Bed Mobility Overal bed mobility: Modified Independent             General bed mobility comments: sitting EOB on arrival  Transfers Overall transfer level: Needs assistance Equipment used: Rolling walker (2 wheeled) Transfers: Sit to/from Stand Sit to Stand: Supervision            Ambulation/Gait Ambulation/Gait assistance: Supervision Ambulation Distance (Feet): 260 Feet Assistive device:  (IV pole) Gait Pattern/deviations: WFL(Within Functional Limits) Gait velocity: decr   General Gait Details: pt had his sneakers partially on however did not wish to correct, no unsteadiness observed though, pt reports slightly SOB (discussed taking breaks as needed as he hasn't ambulated in hallway in days) SpO2 above 95% room air during gait   Stairs            Wheelchair Mobility    Modified Rankin (Stroke Patients Only)       Balance                                    Cognition Arousal/Alertness: Awake/alert Behavior During Therapy: WFL for tasks assessed/performed Overall Cognitive Status: Within Functional Limits for tasks assessed                       Exercises      General Comments        Pertinent Vitals/Pain PCA encouraged, SpO2 >95% room air during ambulation, reapplied O2 [place] upon return to room    Home Living                      Prior Function            PT Goals (current goals can now be found in the care plan section) Progress towards PT goals: Goals met/education completed, patient discharged from PT    Frequency       PT Plan Other (comment) (d/c from PT)    Co-evaluation             End of Session   Activity Tolerance: Patient tolerated treatment well Patient left: in bed;with call bell/phone within reach;with family/visitor present     Time: 6967-8938 PT Time Calculation (min): 10 min  Charges:  $Gait Training: 8-22 mins                    G Codes:      Junius Argyle 01/18/2014, 4:14 PM Carmelia Bake, PT, DPT 01/18/2014 Pager: [phone]

## 2014-01-18 NOTE — Progress Notes (Signed)
Dose Received = 4.99, Demands = 28, Delivered = 28 This is the 24 hour total for PCA

## 2014-01-18 NOTE — Progress Notes (Signed)
Blood bank notified staff that the blood available to transfuse was not completely compatible due to the patients antibodies. Patient states this is a recurring issue, and the MD on call was also notified.   Orders to continue with the blood transfusions after premeds were placed. The patient tolerated both units well and repeat Hgb went up to 7.7 from 4.9.

## 2014-01-18 NOTE — Progress Notes (Signed)
Patient states he is feeling anxious and unable to sleep, medicated with po ativan at his request as ordered.  Resting quietly in bed using his phone.

## 2014-01-19 DIAGNOSIS — M549 Dorsalgia, unspecified: Secondary | ICD-10-CM

## 2014-01-19 LAB — CBC WITH DIFFERENTIAL/PLATELET
BASOS ABS: 0 10*3/uL (ref 0.0–0.1)
BASOS PCT: 0 % (ref 0–1)
EOS ABS: 0.2 10*3/uL (ref 0.0–0.7)
EOS PCT: 1 % (ref 0–5)
HCT: 21.8 % — ABNORMAL LOW (ref 39.0–52.0)
Hemoglobin: 7.1 g/dL — ABNORMAL LOW (ref 13.0–17.0)
Lymphocytes Relative: 27 % (ref 12–46)
Lymphs Abs: 3.9 10*3/uL (ref 0.7–4.0)
MCH: 30.6 pg (ref 26.0–34.0)
MCHC: 32.6 g/dL (ref 30.0–36.0)
MCV: 94 fL (ref 78.0–100.0)
MONO ABS: 1.4 10*3/uL — AB (ref 0.1–1.0)
Monocytes Relative: 10 % (ref 3–12)
NEUTROS ABS: 8.6 10*3/uL — AB (ref 1.7–7.7)
Neutrophils Relative %: 61 % (ref 43–77)
Platelets: 419 10*3/uL — ABNORMAL HIGH (ref 150–400)
RBC: 2.32 MIL/uL — ABNORMAL LOW (ref 4.22–5.81)
RDW: 20.5 % — AB (ref 11.5–15.5)
WBC: 14.1 10*3/uL — ABNORMAL HIGH (ref 4.0–10.5)

## 2014-01-19 LAB — CULTURE, BLOOD (ROUTINE X 2)

## 2014-01-19 LAB — COMPREHENSIVE METABOLIC PANEL
ALT: 24 U/L (ref 0–53)
AST: 19 U/L (ref 0–37)
Albumin: 3 g/dL — ABNORMAL LOW (ref 3.5–5.2)
Alkaline Phosphatase: 242 U/L — ABNORMAL HIGH (ref 39–117)
BUN: 13 mg/dL (ref 6–23)
CALCIUM: 9.1 mg/dL (ref 8.4–10.5)
CO2: 23 mEq/L (ref 19–32)
CREATININE: 0.78 mg/dL (ref 0.50–1.35)
Chloride: 100 mEq/L (ref 96–112)
GFR calc non Af Amer: >90 mL/min (ref >90)
GLUCOSE: 123 mg/dL — AB (ref 70–99)
POTASSIUM: 4.2 meq/L (ref 3.7–5.3)
Sodium: 136 mEq/L — ABNORMAL LOW (ref 137–147)
TOTAL PROTEIN: 6.9 g/dL (ref 6.0–8.3)
Total Bilirubin: 1 mg/dL (ref 0.3–1.2)

## 2014-01-19 LAB — TYPE AND SCREEN
ABO/RH(D): AB POS
Antibody Screen: POSITIVE
DAT, IgG: POSITIVE
UNIT DIVISION: 0
Unit division: 0

## 2014-01-19 LAB — LACTATE DEHYDROGENASE: LDH: 257 U/L — ABNORMAL HIGH (ref 94–250)

## 2014-01-19 NOTE — Progress Notes (Addendum)
    [facility]  Date of Admission:  01/01/2014  Antibiotics: Bactrim day 5  Subjective: No new events  Objective: Temp:  [97.6 F (36.4 C)-98.3 F (36.8 C)] 98.3 F (36.8 C) (05/08 1315) Pulse Rate:  [79-94] 81 (05/08 1315) Resp:  [11-20] 15 (05/08 1315) BP: (105-106)/(65-72) 105/69 mmHg (05/08 1315) SpO2:  [98 %-100 %] 99 % (05/08 1315)  General: awake, alert, nad  Lab Results Lab Results  Component Value Date   WBC 14.1* 01/19/2014   HGB 7.1* 01/19/2014   HCT 21.8* 01/19/2014   MCV 94.0 01/19/2014   PLT 419* 01/19/2014    Lab Results  Component Value Date   CREATININE 0.78 01/19/2014   BUN 13 01/19/2014   NA 136* 01/19/2014   K 4.2 01/19/2014   CL 100 01/19/2014   CO2 23 01/19/2014    Lab Results  Component Value Date   ALT 24 01/19/2014   AST 19 01/19/2014   ALKPHOS 242* 01/19/2014   BILITOT 1.0 01/19/2014      Microbiology: Recent Results (from the past 240 hour(s))  URINE CULTURE     Status: None   Collection Time    01/11/14 12:04 PM      Result Value Ref Range Status   Specimen Description URINE, CATHETERIZED   Final   Special Requests NONE   Final   Culture  Setup Time     Final   Value: 01/11/2014 14:48     Performed at [place]     Final   Value: NO GROWTH     Performed at [facility]   Culture     Final   Value: NO GROWTH     Performed at [facility]   Report Status 01/12/2014 FINAL   Final  CULTURE, BLOOD (ROUTINE X 2)     Status: None   Collection Time    01/12/14 10:25 AM      Result Value Ref Range Status   Specimen Description BLOOD LEFT HAND   Final   Special Requests BOTTLES DRAWN AEROBIC ONLY 2CC   Final   Culture  Setup Time     Final   Value: 01/12/2014 14:40     Performed at [facility]   Culture     Final   Value: RALSTONIA PICKETTI     Note: Gram Stain Report Called to,Read Back By and Verified With: Henrietta Dine 01/16/14 1435 BY SMITHERSJ     Performed at [facility]   Report Status 01/19/2014 FINAL   Final   Organism ID, Bacteria RALSTONIA PICKETTI   Final    Studies/Results: No results found.  Assessment/Plan: 1) bacteremia - second organism is Ralstonia picketti and likely is related to the first blood culture.  Sensitive to Bactrim and can continue.  Remains afebrile.    I will follow up again on Monday.  thanks  Thayer Headings, [facility] [place] [email or web address] [phone] pager   [phone] cell 01/19/2014, 3:15 PM

## 2014-01-19 NOTE — Progress Notes (Signed)
SICKLE CELL SERVICE PROGRESS NOTE  Dennis Bernard NFA:[id number] DOB: [birth year] DOA: 01/01/2014 PCP: Wilhelmina Mcardle, MD  Assessment/Plan: Principal Problem:   Sickle cell crisis Active Problems:   Chronic pain  1.  Bacteremia: Appreciate ID input.  ID of 1st pathogen is Burkholderia Picketti and appears to be responding to Bactrim. However the same Pathogen has been Identified in subsequent cultures. ID resent cultures yesterday and pt will have to remain hospitalized until cultures are at least 48 hours as he has no reliable follow up. Pt on day #5 of Bactrim and received Levaquin for 4 days prior to Bactrim.  2. Hb SS with crisis: Pt on oral medication and using very little breakthrough doses. Will continue oral medications as  re-evaluate tomorrow. 3. Anemia: Pt had a decrease in Hb down to 4.9. He was transfused 2 units of PRBC's yesterday and Hb is 7.7 today. He received a dose of Solumedrol preceding transfusion. His Hb is essentially stable today. He has no evidence of hemolysis.  Will continue to monitor for late and delayed transfusion reactions. 4. Leukocytosis: Improved. Likely related to Bacteremia. 5. Tachycardia:Resolved. 6. Hypotension/ Sepsis: In retrospect was likely due to GNR bacteremia. Now resolved. 7. Fever: Last fever on 5/5. Will continue to monitor. 8. Psychosocial: Pt has had much difficulty during this admission with suspicious behaviors surrounding his opiate analgesics. The patient admits to having difficulty with telling the truth as he wants to be endeared to his conversant. He admits to issues of trust and has fears of being stereotyped. I have spoken with patient at length on many occasions about the need for therapy to assist him in addressing his Psychosocial issues. He has refused in the past but now states that he would be willing to follow through on an outpatient basis. Mr Mangel has misrepresented information and will often mischaracterize his pain to  achieve secondary gain so that it is difficult to assess if the patient is actually having pain adversely impacting his function, or if he just wants to obtain medication. The patient also does not have a prescriber for his analgesics as he has burnt his bridges with multiple providers and thus he has much incentive for mis characterizing his pain as it is all subjective. Mr. Ruane has expressed the lack of support from his family because of religious differences. If he is willing to enter Therapy and follow through with seeing a substance abuse counselor, I will abe willing to give him another try as a patient in the practice as I am afraid that without consistent medical care, this patient is at increased mortality and morbidity risk.   Code Status: Full Code  Family Communication: N/A Disposition Plan: Not yet ready for discharge  Leana Gamer  Pager [phone]. If 7PM-7AM, please contact night-coverage.  01/19/2014, 8:03 PM  LOS: 18 days   Brief narrative: Dennis Bernard is a 28 y.o. male with istory of sickle cell anemia presents to the ED with a few days of pain and shortness of breath and sickle cell crisis. Patient has had pain in his chest as well as his shoulder. Patient states that he has noted the pain to be sharp and is typical of his sickle crisis. He states last admission was in February sometime for this. He has been taking his pain medications regularly lately due to increase in his symptoms. No cough noted. No hemoptysis is noted. Has not had any syncope. He has noted some fevers though he is not entirely sure  about this. He has no nausea and no vointing. Denies having any abdominal pain   Consultants:  None  Procedures:  None  Antibiotics:  Levaquin 4/28 >> 5/1  Vancomycin 5/1>>5/3  Bactrim 5/4 >>  HPI/Subjective: Pt states that he has very little pain today. Pt able to ambulate today. He rates the pain as 5/10 , sharp and non-radiating  in nature.   Objective: Filed Vitals:   01/19/14 1315 01/19/14 1600 01/19/14 1752 01/19/14 1830  BP: 105/69   109/72  Pulse: 81   75  Temp: 98.3 F (36.8 C)   98.1 F (36.7 C)  TempSrc: Oral   Oral  Resp: 15 20 22 20   Height:      Weight:      SpO2: 99% 99% 100% 100%   Weight change:   Intake/Output Summary (Last 24 hours) at 01/19/14 2003 Last data filed at 01/19/14 1831  Gross per 24 hour  Intake 1939.3 ml  Output   3050 ml  Net -1110.7 ml    General: Alert, awake, oriented x3, in no acute distress. Pt resting comfortably in bed. HEENT: [place]/AT PEERL, EOMI, anicteric Heart: Regular rate and rhythm, without murmurs, rubs, gallops.  Lungs: Clear to auscultation, no wheezing or rhonchi noted.  Abdomen: Soft, nontender, nondistended, positive bowel sounds, no masses no hepatosplenomegaly noted.  Neuro: No focal neurological deficits noted cranial nerves II through XII grossly intact.  Strength normal in bilateral upper and lower extremities. Musculoskeletal: No warm swelling or erythema around joints, no spinal tenderness noted. Psychiatric: Patient alert and oriented x3, good insight and cognition, good recent to remote recall. Lymph node survey: No cervical axillary or inguinal lymphadenopathy noted.   Data Reviewed: Basic Metabolic Panel:  Recent Labs Lab 01/15/14 0500 01/16/14 0642 01/17/14 0335 01/18/14 0630 01/19/14 0540  NA 136* 138 135* 137 136*  K 4.6 4.9 5.3 5.4* 4.2  CL 97 99 98 100 100  CO2 28 27 27 25 23   GLUCOSE 108* 122* 102* 168* 123*  BUN 19 16 14 14 13   CREATININE 0.95 1.15 1.10 0.92 0.78  CALCIUM 8.9 9.0 8.7 9.4 9.1   Liver Function Tests:  Recent Labs Lab 01/15/14 0500 01/16/14 0642 01/17/14 0335 01/18/14 0630 01/19/14 0540  AST 25 22 22 26 19   ALT 30 26 24 28 24   ALKPHOS 252* 261* 243* 283* 242*  BILITOT 1.8* 1.0 1.0 1.2 1.0  PROT 7.0 6.9 6.5 7.2 6.9  ALBUMIN 3.0* 2.9* 2.8* 3.2* 3.0*   No results found for this basename: LIPASE, AMYLASE,  in  the last 168 hours No results found for this basename: AMMONIA,  in the last 168 hours CBC:  Recent Labs Lab 01/15/14 0500 01/16/14 0642 01/17/14 0335 01/18/14 0630 01/19/14 0540  WBC 13.3* 14.3* 15.3* 11.4* 14.1*  NEUTROABS 8.9* 9.5* 10.7* 10.6* 8.6*  HGB 5.5* 5.1* 4.9* 7.7* 7.1*  HCT 16.6* 15.1* 15.0* 23.6* 21.8*  MCV 92.2 91.5 94.3 91.5 94.0  PLT 266 306 339 437* 419*   Cardiac Enzymes: No results found for this basename: CKTOTAL, CKMB, CKMBINDEX, TROPONINI,  in the last 168 hours BNP (last 3 results)  Recent Labs  10/20/13 1935 10/22/13 0300  PROBNP 471.8* 160.5*   CBG: No results found for this basename: GLUCAP,  in the last 168 hours  Recent Results (from the past 240 hour(s))  URINE CULTURE     Status: None   Collection Time    01/11/14 12:04 PM      Result Value Ref  Range Status   Specimen Description URINE, CATHETERIZED   Final   Special Requests NONE   Final   Culture  Setup Time     Final   Value: 01/11/2014 14:48     Performed at [facility]     Final   Value: NO GROWTH     Performed at [facility]   Culture     Final   Value: NO GROWTH     Performed at [facility]   Report Status 01/12/2014 FINAL   Final  CULTURE, BLOOD (ROUTINE X 2)     Status: None   Collection Time    01/12/14 10:25 AM      Result Value Ref Range Status   Specimen Description BLOOD LEFT HAND   Final   Special Requests BOTTLES DRAWN AEROBIC ONLY 2CC   Final   Culture  Setup Time     Final   Value: 01/12/2014 14:40     Performed at [facility]   Culture     Final   Value: RALSTONIA PICKETTI     Note: Gram Stain Report Called to,Read Back By and Verified With: Henrietta Dine 01/16/14 1435 BY SMITHERSJ     Performed at [facility]   Report Status 01/19/2014 FINAL   Final   Organism ID, Bacteria RALSTONIA PICKETTI   Final     Studies: Dg Chest 2 View  (if Recent History Of Cough Or Chest Pain)  01/01/2014   CLINICAL  DATA:  Chest tightness, cough and fever  EXAM: CHEST  2 VIEW  COMPARISON:  Prior chest x-ray 11/06/2013  FINDINGS: Stable cardiac and mediastinal contours. Very low inspiratory volumes. Similar appearance of mild diffuse interstitial prominence. The central bronchial wall thickening similar compared to prior. No focal airspace consolidation. No pleural effusion or pneumothorax. Surgical changes of prior left shoulder arthroplasty. No acute osseous abnormality.  IMPRESSION: Stable chest x-ray without evidence of acute cardiopulmonary disease.   Electronically Signed   By: Jacqulynn Cadet M.D.   On: 01/01/2014 18:37    Scheduled Meds: . amitriptyline  25 mg Oral QHS  . folic acid  1 mg Oral q morning - 10a  . heparin  5,000 Units Subcutaneous 3 times per day  . HYDROmorphone PCA 0.3 mg/mL   Intravenous 6 times per day  . HYDROmorphone  4 mg Oral Q4H  . hydroxyurea  1,500 mg Oral Daily  . lactose free nutrition  237 mL Oral TID WC  . morphine  60 mg Oral 3 times per day  . multivitamin with minerals  1 tablet Oral Daily  . pantoprazole  40 mg Oral Daily  . senna-docusate  1 tablet Oral BID  . sodium chloride  10-40 mL Intracatheter Q12H  . sulfamethoxazole-trimethoprim  1 tablet Oral Q12H   Continuous Infusions: . sodium chloride 10 mL/hr at 01/19/14 0148    Total time spent 37 minutes  Leana Gamer

## 2014-01-20 MED ORDER — HYDROMORPHONE HCL 4 MG PO TABS: 4.0000 mg | ORAL_TABLET | ORAL | Status: AC | PRN

## 2014-01-20 MED ORDER — SULFAMETHOXAZOLE-TMP DS 800-160 MG PO TABS: 1.0000 | ORAL_TABLET | Freq: Two times a day (BID) | ORAL | Status: AC

## 2014-01-20 MED ORDER — MORPHINE SULFATE ER 60 MG PO TBCR
60.0000 mg | EXTENDED_RELEASE_TABLET | Freq: Three times a day (TID) | ORAL | Status: AC
Start: 2014-01-20 — End: ?

## 2014-01-20 NOTE — Progress Notes (Signed)
Pt ready for DC. No order to DC picc. MD made aware. Gave telephone order to DC Pic[facility]. Order placed and IV team made aware. Vwilliams,rn.

## 2014-01-20 NOTE — Progress Notes (Signed)
PICC ,line d/c'ed per order for d/c to home.  Site without signs of infection.  No ADN.  Verbalizes understyanding of site care, signs of infection and bleeding and interventions for all.  Vaseline guaze and dry 4x4 applied at the site.  Vanita Ingles, RN at bedside.

## 2014-01-20 NOTE — Progress Notes (Signed)
Patient discharged to home. DC instructions given. Prescriptions also given. No concerns voiced. Left unit in wheelchair pushed by nurse tech accompanied by male family member who came in to transport pt to home. Left in good condition. Vwilliams, rn.

## 2014-01-20 NOTE — Discharge Summary (Signed)
Physician Discharge Summary  Patient ID: Dennis Bernard MRN: [id number] DOB/AGE: 1986-07-31 28 y.o.  Admit date: 01/01/2014 Discharge date: 01/20/2014  Admission Diagnoses:  Discharge Diagnoses:  Principal Problem:   Sickle cell crisis Active Problems:   Chronic pain   Hyponatremia   Discharged Condition: good  Hospital Course: Patient has heart a prolonged hospital course. He was initially admitted on 01/01/2014 with sickle cell painful crisis. Patient continued to have problems with pain control initially. He also has some social issues including use of on arthritis medications. This necessitated psychiatric consult in the beginning of his hospitalization. Later patient developed delayed hemolytic reaction after receiving blood transfusion 2 weeks earlier. His mobility is continued to drop to as low as 4.9. Hematology was consulted and recommendations made to transfuse after his hemoglobin dropped below 5. Patient continued to have fever as leukocytosis. Blood cultures continued to be positive but growing unusual soil organisms. Infectious disease was therefore consulted and patient has been placed on Bactrim to which both organisms growing in his blood responded to. In the last 24-48 hours he has not had any fever. His hemoglobin is also staying at about 7 since transfusion 2 days ago. He is being discharged home to follow up with Dr. Zigmund Daniel who is reconsidering taking him in as a patient to the clinic. He has received extensive counseling while in the hospital. Dr. Ronalee Belts is to follow up on his last blood cultures if is positive she will give him a call to plan the next step in his management. She is back to his baseline and has received prescriptions for his pain medications as well as his antibiotics until he is seen by his hematologist is as well as PCP. Patient also had gait abnormalities and was evaluated by physical therapy. He will wear a rolling walker at home for mobility in the  future. Consults: ID, hematology/oncology and psychiatry  Significant Diagnostic Studies: labs: Several CBCs CMP is chest x-rays as well as CT chest were done. No identified issues appeared.  Treatments: IV hydration, antibiotics: Bactrim, analgesia: Dilaudid, respiratory therapy: O2 and blood transfusion of 2 units PRBC.  Discharge Exam: Blood pressure 91/47, pulse 91, temperature 98.2 F (36.8 C), temperature source Oral, resp. rate 14, height 5\' 9"  (1.753 m), weight 76.703 kg (169 lb 1.6 oz), SpO2 100.00%. General appearance: alert, cooperative and no distress Eyes: conjunctivae/corneas clear. PERRL, EOM's intact. Fundi benign. Neck: no adenopathy, no carotid bruit, no JVD, supple, symmetrical, trachea midline and thyroid not enlarged, symmetric, no tenderness/mass/nodules Back: symmetric, no curvature. ROM normal. No CVA tenderness. Resp: clear to auscultation bilaterally Chest wall: no tenderness Cardio: regular rate and rhythm, S1, S2 normal, no murmur, click, rub or gallop GI: soft, non-tender; bowel sounds normal; no masses,  no organomegaly Extremities: extremities normal, atraumatic, no cyanosis or edema Pulses: 2+ and symmetric Skin: Skin color, texture, turgor normal. No rashes or lesions Neurologic: Grossly normal  Disposition: 01-Home or Self Care  Discharge Orders   Future Orders Complete By Expires   For home use only DME 4 wheeled rolling walker with seat  As directed        Medication List         albuterol 108 (90 BASE) MCG/ACT inhaler  Commonly known as:  PROVENTIL HFA;VENTOLIN HFA  Inhale 2 puffs into the lungs every 6 (six) hours as needed for wheezing or shortness of breath.     diphenhydrAMINE 25 MG tablet  Commonly known as:  BENADRYL  Take 1 tablet (25  mg total) by mouth every 8 (eight) hours as needed for itching.     docusate sodium 100 MG capsule  Commonly known as:  COLACE  Take 100 mg by mouth 2 (two) times daily as needed for mild  constipation.     folic acid 1 MG tablet  Commonly known as:  FOLVITE  Take 1 mg by mouth every morning.     HYDROmorphone 4 MG tablet  Commonly known as:  DILAUDID  Take 1 tablet (4 mg total) by mouth every 4 (four) hours as needed for severe pain.     hydroxyurea 500 MG capsule  Commonly known as:  HYDREA  Take 5 capsules (2,500 mg total) by mouth daily. May take with food to minimize GI side effects.     morphine 60 MG 12 hr tablet  Commonly known as:  MS CONTIN  Take 1 tablet (60 mg total) by mouth 3 (three) times daily.     multivitamin with minerals Tabs tablet  Take 1 tablet by mouth daily.     polyethylene glycol packet  Commonly known as:  MIRALAX / GLYCOLAX  Take 17 g by mouth 2 (two) times daily as needed for mild constipation or moderate constipation.     promethazine 25 MG tablet  Commonly known as:  PHENERGAN  Take 1 tablet (25 mg total) by mouth every 6 (six) hours as needed for nausea.     sulfamethoxazole-trimethoprim 800-160 MG per tablet  Commonly known as:  BACTRIM DS  Take 1 tablet by mouth 2 (two) times daily.         Signed: Breelynn Bankert L Bronte Kropf 01/20/2014, 9:58 AM  Time spent 45 minutes

## 2014-01-22 LAB — HEMOGLOBINOPATHY EVALUATION
HGB A: 51.4 % — AB (ref 96.8–97.8)
Hemoglobin Other: 0 %
Hgb A2 Quant: 2.9 % (ref 2.2–3.2)
Hgb F Quant: 8.3 % — ABNORMAL HIGH (ref 0.0–2.0)
Hgb S Quant: 37.4 % — ABNORMAL HIGH

## 2014-01-25 LAB — CULTURE, BLOOD (ROUTINE X 2)
Culture: NO GROWTH
Culture: NO GROWTH

## 2014-02-24 IMAGING — CR DG CHEST 2V
2 series · 2 of 2 positions shown · non-contrast
Comparison: 09/12/2013

CLINICAL DATA: Cough and sickle cell anemia

EXAM:
CHEST  2 VIEW

[w chest pa]
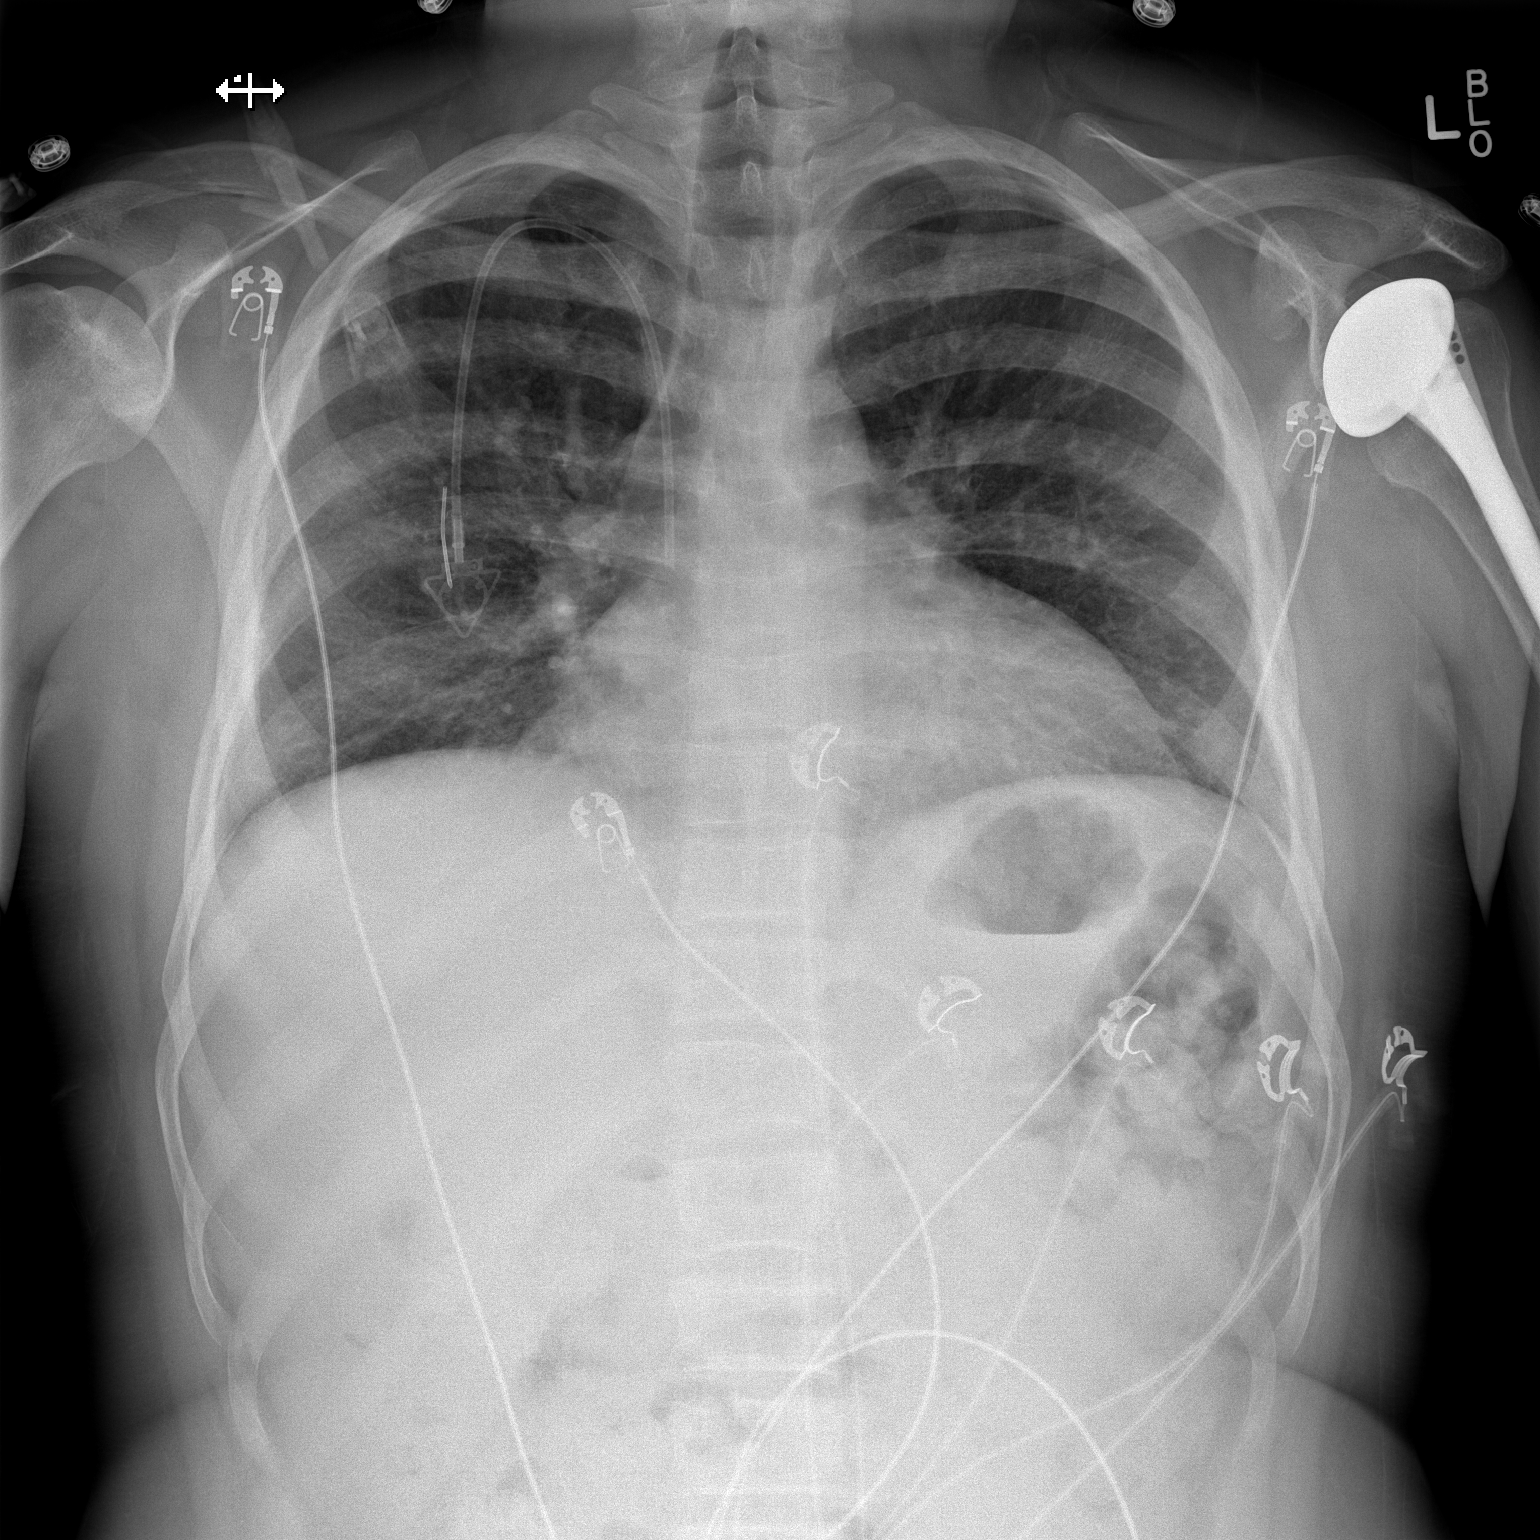

[w chest lat]
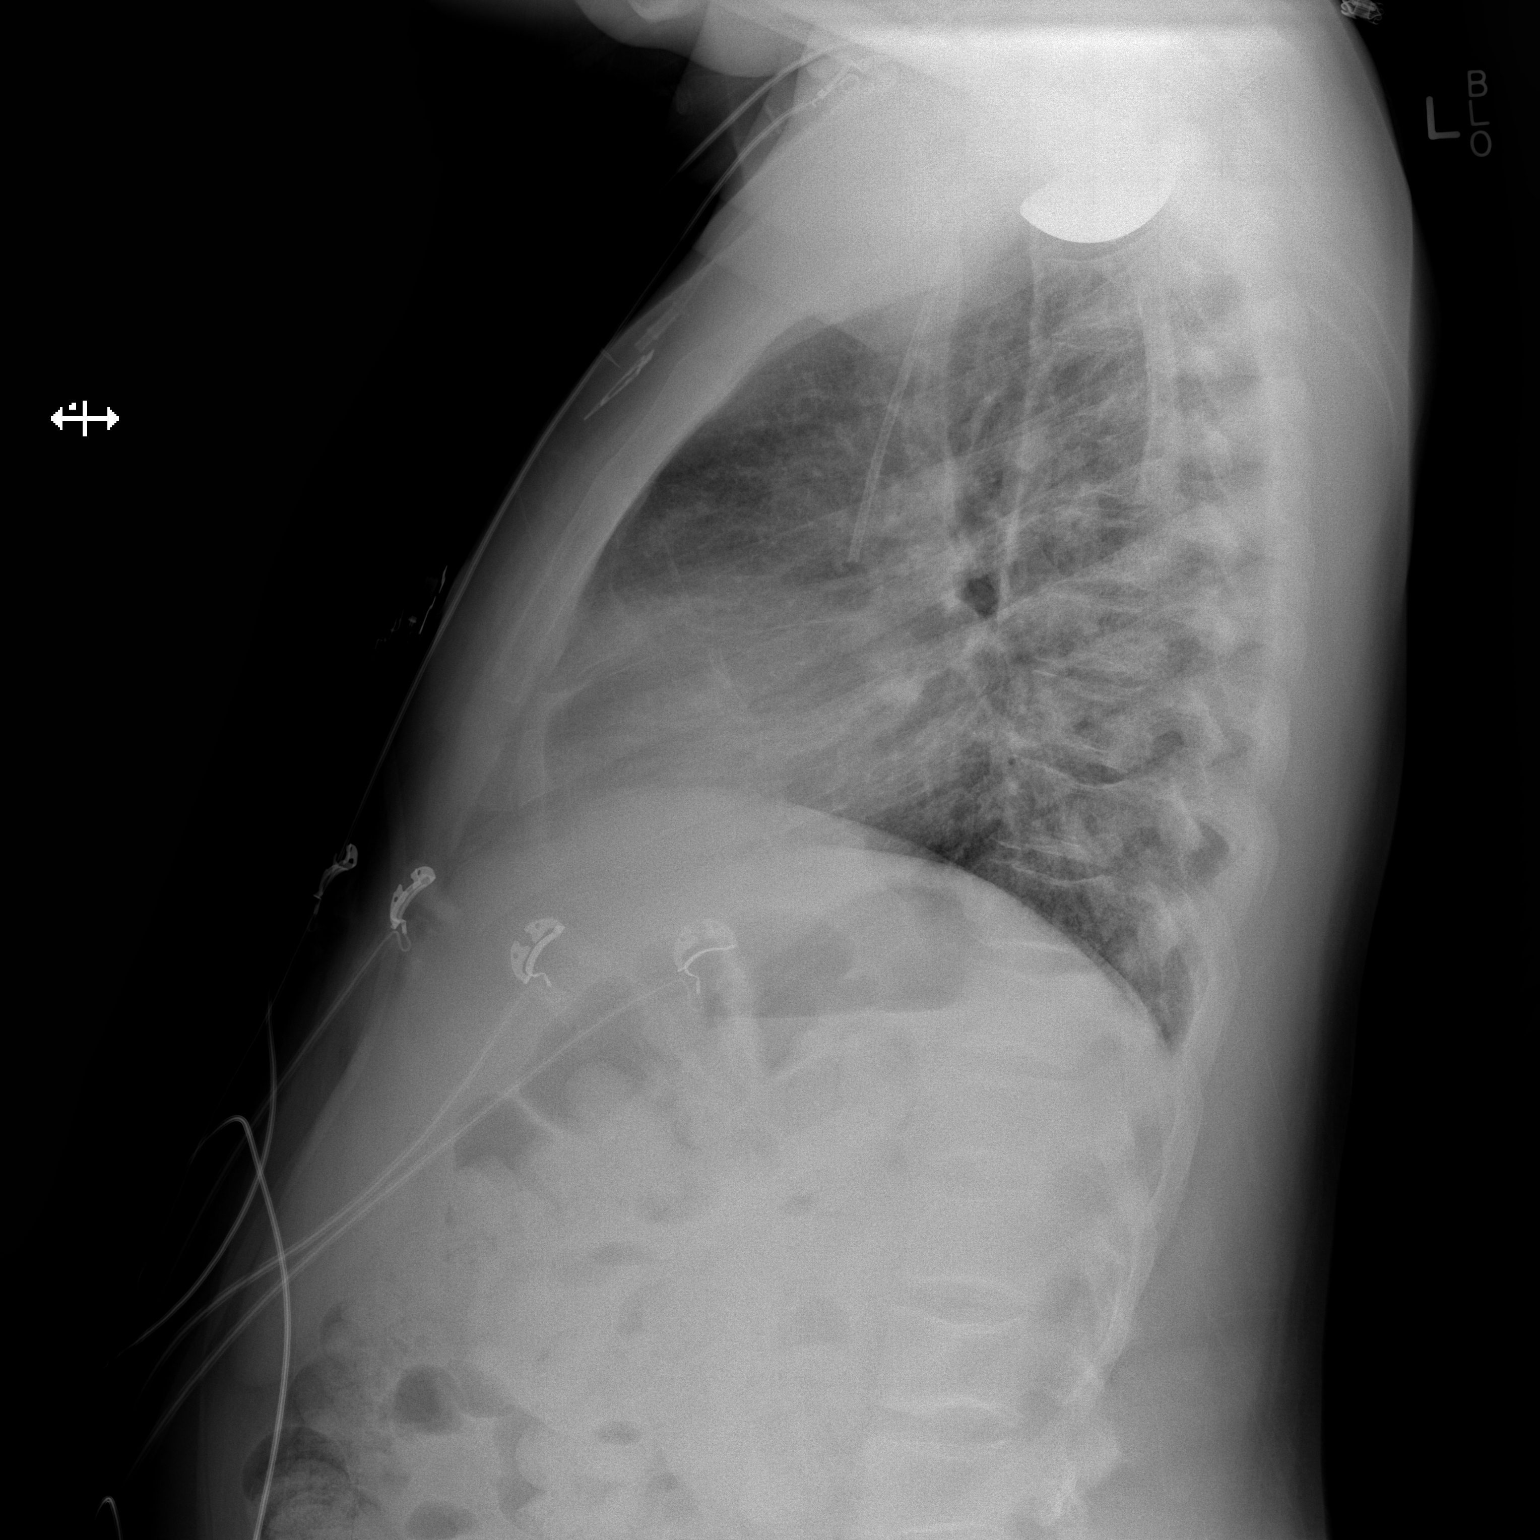

[2 of 2 positions shown; findings below may reference images not displayed]

FINDINGS: Cardiac shadow is stable. A right-sided chest wall port is again
seen and stable. Postsurgical changes in the left shoulder are
noted. No focal infiltrate is seen. No acute bony abnormality is
noted.
IMPRESSION: No acute abnormality seen.

## 2014-06-03 IMAGING — CR DG CHEST 2V
2 series · 2 of 2 positions shown · non-contrast
Comparison: Prior chest x-ray 11/06/2013

CLINICAL DATA: Chest tightness, cough and fever

EXAM:
CHEST  2 VIEW

[w chest pa]
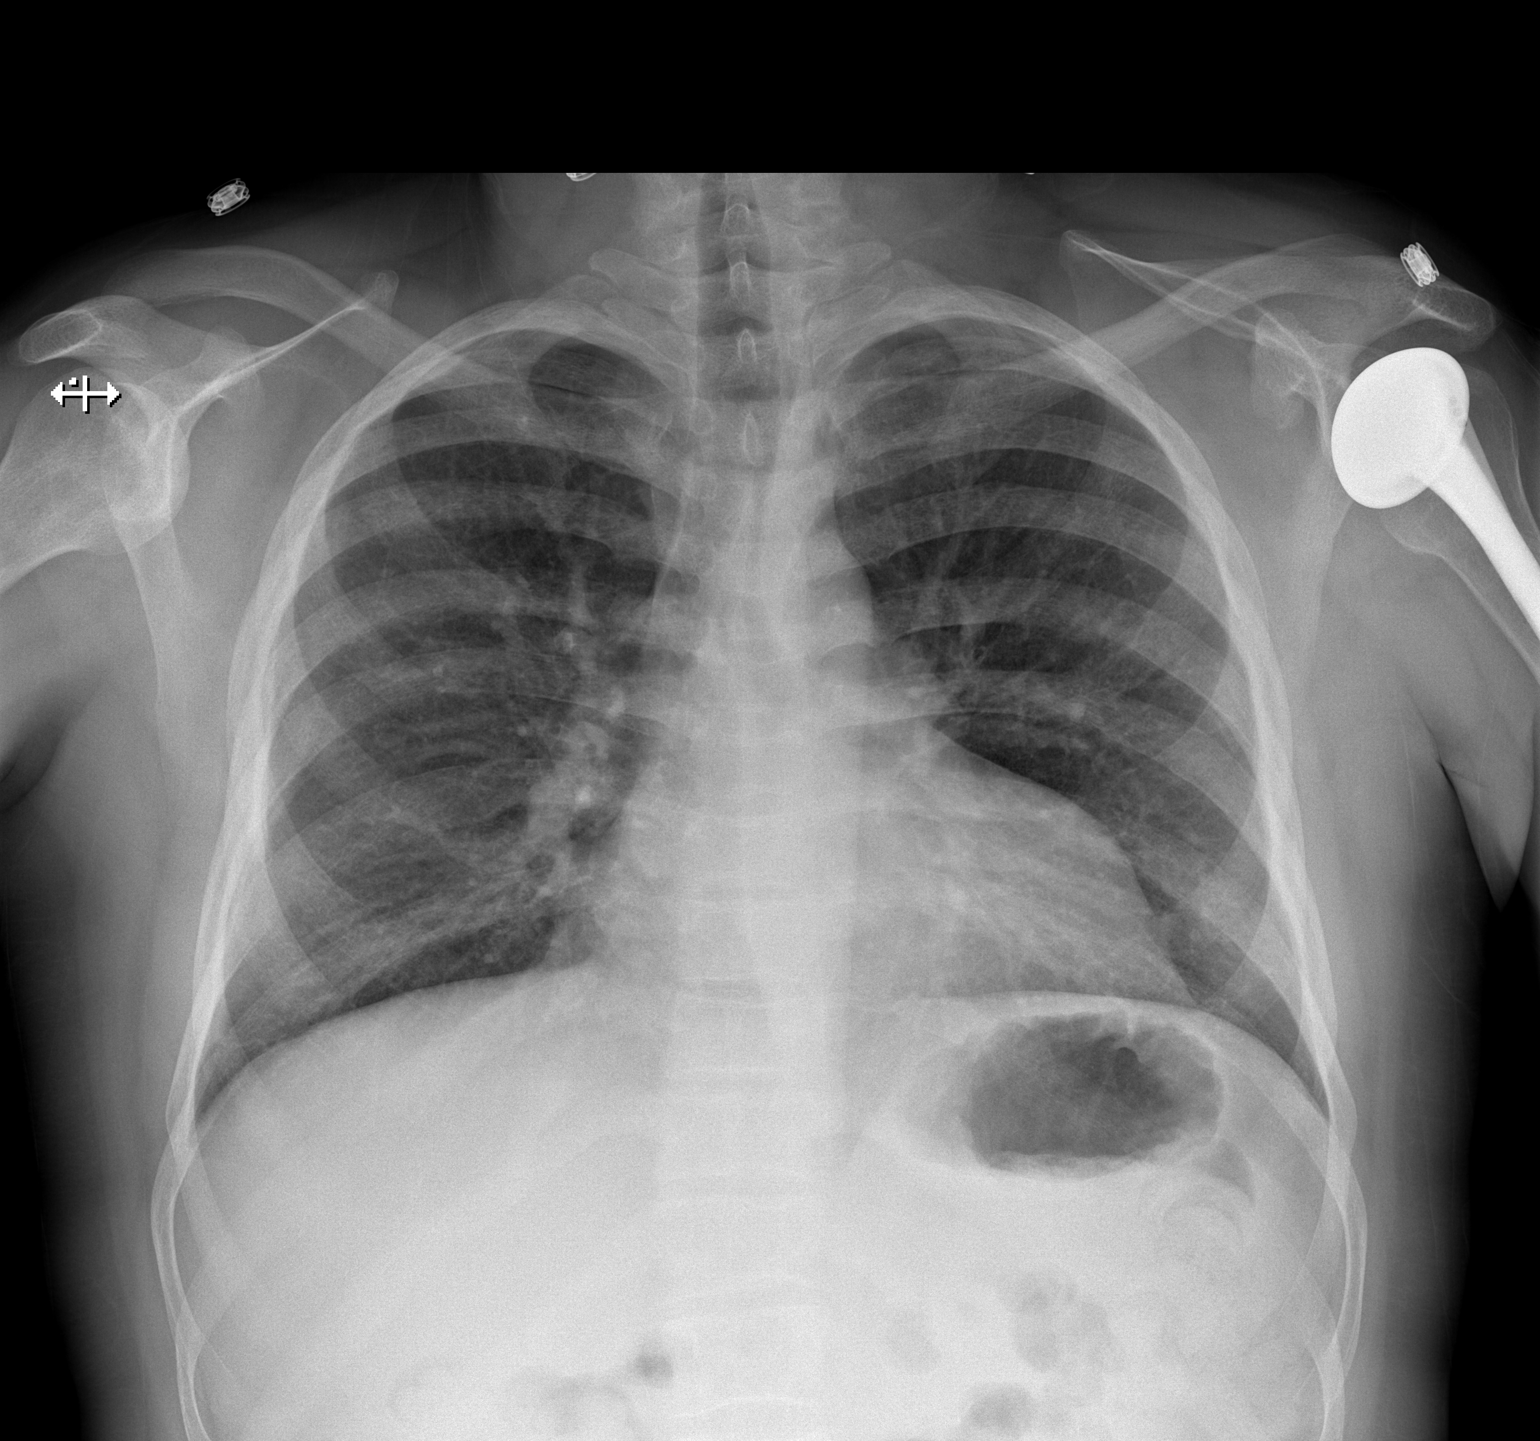

[w chest lat]
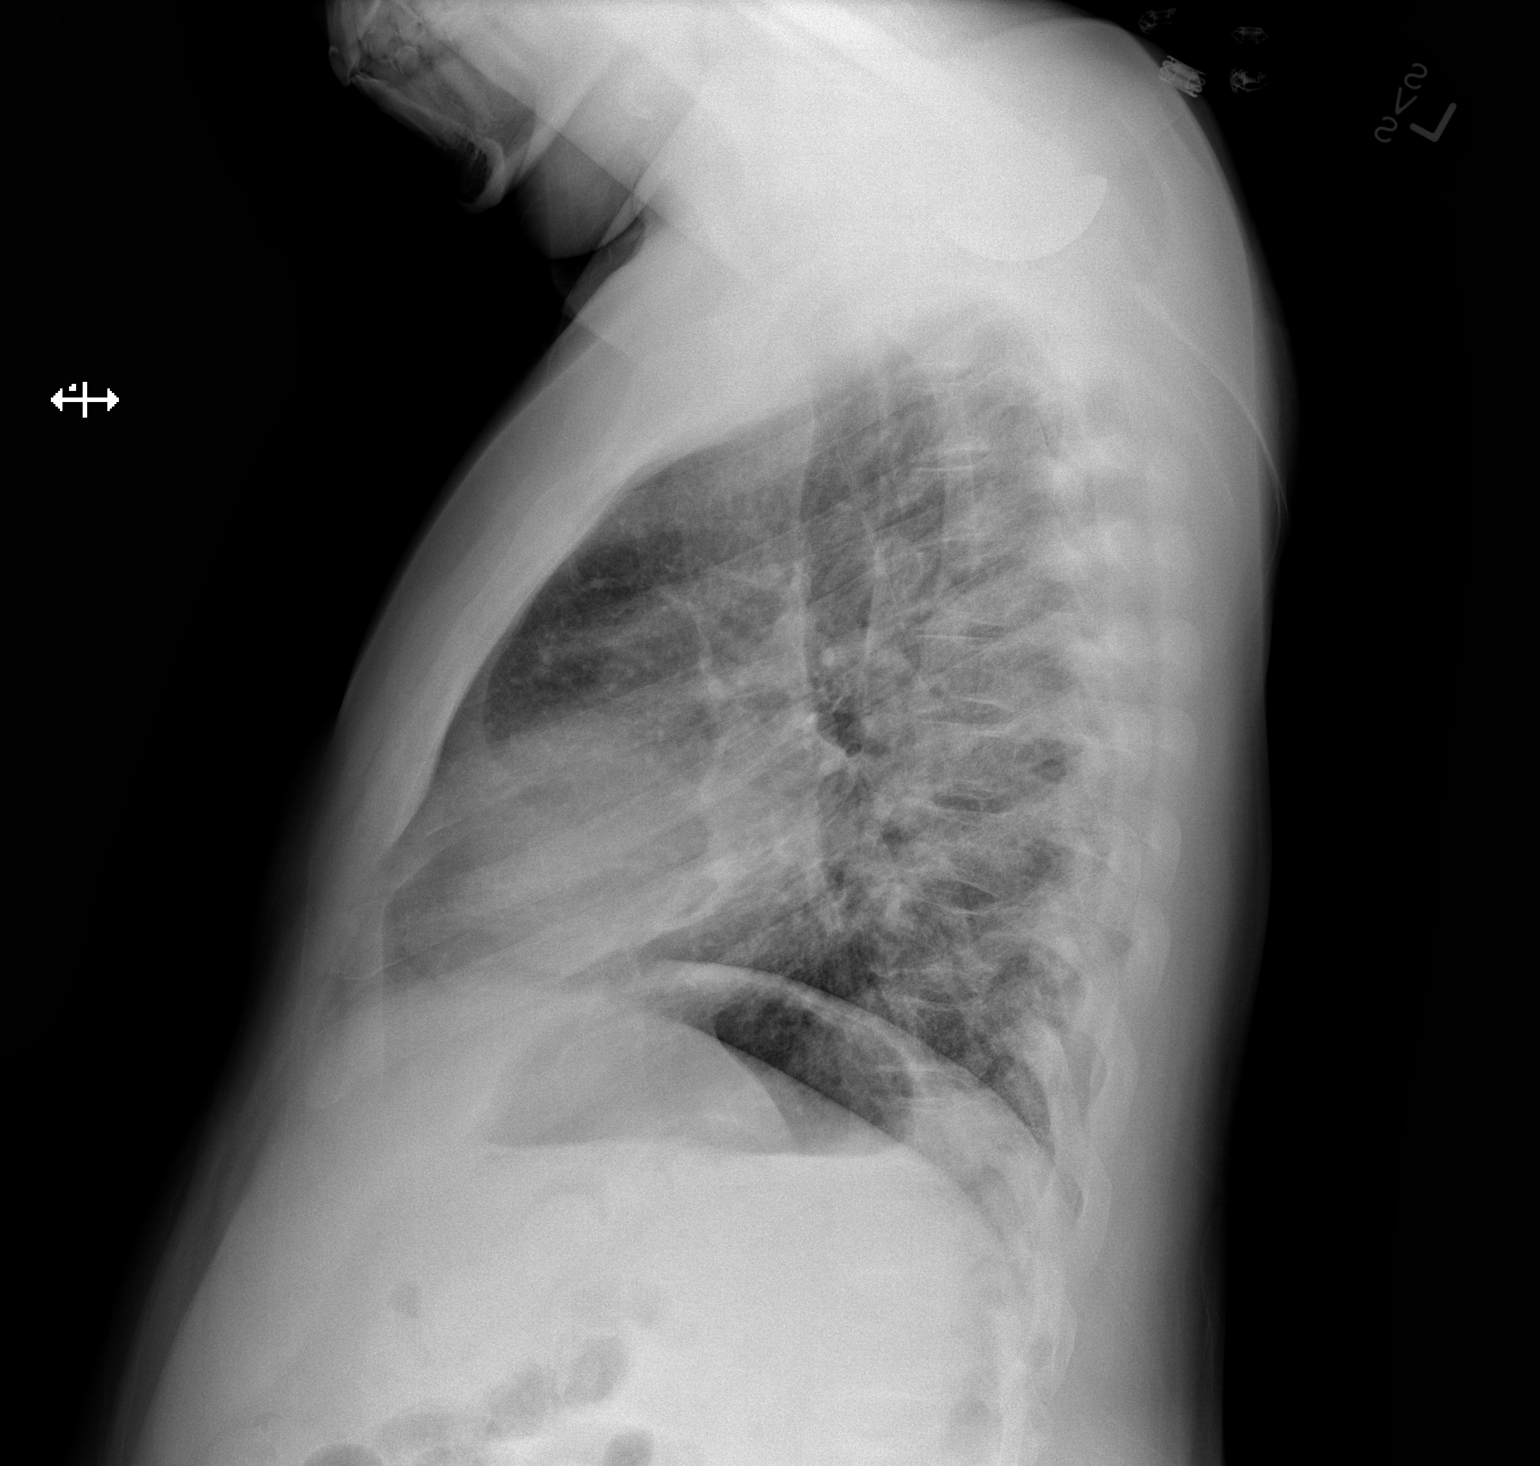

[2 of 2 positions shown; findings below may reference images not displayed]

FINDINGS: Stable cardiac and mediastinal contours. Very low inspiratory
volumes. Similar appearance of mild diffuse interstitial prominence.
The central bronchial wall thickening similar compared to prior. No
focal airspace consolidation. No pleural effusion or pneumothorax.
Surgical changes of prior left shoulder arthroplasty. No acute
osseous abnormality.
IMPRESSION: Stable chest x-ray without evidence of acute cardiopulmonary
disease.

## 2014-06-05 NOTE — Progress Notes (Signed)
01/09/14 1351  PT Time Calculation  PT Start Time 1221  PT Stop Time 1239  PT Time Calculation (min) 18 min  PT G-Codes **NOT FOR INPATIENT CLASS**  Functional Assessment Tool Used clinical judgement  Functional Limitation Mobility: Walking and moving around  Mobility: Walking and Moving Around Current Status (K0938) CL  Mobility: Walking and Moving Around Goal Status (H8299) CI  PT General Charges  $$ ACUTE PT VISIT 1 Procedure  PT Evaluation  $Initial PT Evaluation Tier I 1 Procedure  PT Treatments  $Gait Training 8-22 mins

## 2014-06-14 DEATH — deceased

## 2014-07-04 NOTE — Progress Notes (Signed)
01/18/14 1612  PT Time Calculation  PT Start Time 1429  PT Stop Time 1439  PT Time Calculation (min) 10 min  PT G-Codes **NOT FOR INPATIENT CLASS**  Mobility: Walking and Moving Around Goal Status (G9562) CI  Mobility: Walking and Moving Around Discharge Status (Z3086) CI  PT General Charges  $$ ACUTE PT VISIT 1 Procedure  PT Treatments  $Gait Training 8-22 mins  [facility] PT [phone]

## 2015-01-04 NOTE — H&P (Signed)
PATIENT NAME:  Dennis Bernard, Dennis Bernard MR#:  [id number] DATE OF BIRTH:  24-Aug-1986  DATE OF ADMISSION:  01/21/2013  PRIMARY CARE PROVIDER: Dr. Elijio Miles.  EMERGENCY DEPARTMENT REFERRING PHYSICIAN:  Dr. Corinna Capra.   CHIEF COMPLAINT:  Pain in his back, legs and low-grade fever.   HISTORY OF PRESENT ILLNESS:  The patient is a 29 year old African American male with a history of sickle cell disease, who was last hospitalized here on February 3 with sickle cell crisis who returns back stating that he has been having severe back pain, left leg pain which he was unable to control with p.o. medications. He also reports that he has had low-grade fevers. In the ER, his temperature was 98.4 here. The patient otherwise denies any shortness of breath. No chest pains. No cough. He is nauseous, but no vomiting or diarrhea. Denies any urinary frequency, urgency, or hesitancy.   PAST MEDICAL HISTORY:  Significant for sickle cell disease. He reports that last transfusion was about 5 years ago.   PAST SURGICAL HISTORY: 1.  Status post left shoulder replacement secondary avascular necrosis.  2.  Status post cholecystectomy.  3.  Status post Port-A-Cath placement about 6 months ago.   ALLERGIES:  None.   CURRENT MEDICATIONS:  Benadryl 25, 1 tab p.o. daily as needed for allergies and sleep. Dilaudid 4 mg 1 tab p.o. 3 times a day as needed for pain, folic acid 1 mg daily, hydroxyurea 1000 mg b.i.d., thiamine 100 daily.   ALLERGIES:  None.  SOCIAL HISTORY:  Denies any alcohol or drug use.   FAMILY HISTORY:  Mother and father both had sickle trait, brother with sickle cell disease.   REVIEW OF SYSTEMS:  CONSTITUTIONAL:  Complains of low-grade fevers. Complains of fatigueness, weakness. Complains of back pain. No weight loss. No weight gain.  EYES:  No blurred or double vision. No redness. No inflammation. No glaucoma. No cataracts.  ENT:  No tinnitus. No ear pain. No hearing loss. No seasonal or year-round  allergies. No epistaxis. No nasal discharge. No sinus pain. No redness of the oropharynx. No difficulty swallowing.  RESPIRATORY:  Denies any cough, wheezing. No hemoptysis. No dyspnea. No COPD, no TB.   CARDIOVASCULAR:  Denies any chest pains, orthopnea, edema or arrhythmia. No palpitations.  GASTROINTESTINAL:  Complains of nausea but no vomiting or diarrhea. No abdominal pain. No hematemesis. No melena. No changes in bowel habits.  GENITOURINARY:  Denies any dysuria, hematuria, renal calculus or frequency.  ENDOCRINE:   Denies any polydipsia, nocturia, or thyroid problems.  HEMATOLOGIC AND LYMPHATIC:  Has chronic anemia, denies easy bruisability or bleeding.  SKIN:  No acne. No rash. No changes in mole, hair or skin.  MUSCULOSKELETAL:  Complains of pain in his back, legs.  NEUROLOGICAL:  No numbness. No CVA. No TIA. No seizures.  PSYCHIATRIC:  No anxiety. No insomnia. No ADD. No OCD.   PHYSICAL EXAMINATION:  VITAL SIGNS:  Temperature 98.4, pulse 86, respirations 18, blood pressure 111/68, O2 96%.  GENERAL:  The patient is a well-developed, well-nourished, African American male in no acute distress.  HEENT:  Head:  Normocephalic, atraumatic. Eyes: Pupils equally round, reactive to light and accommodation. There is no conjunctival pallor. He has some scleral icterus. Extraocular movements intact. Nose:  No nasal lesions or drainage noted. Ears:  No drainage or external lesions noted. Mouth is moist. No exudate. No lesions or mass noted.  NECK:  Supple and symmetric. No masses. Thyroid midline, not enlarged. No JVD.  RESPIRATORY:  No accessory  muscle usage. Clear to auscultation bilaterally without any rales, rhonchi, or wheezing.  CARDIOVASCULAR:  Regular rate and rhythm. No murmurs, gallops, clicks, or rubs. PMI is not displaced. GASTROINTESTINAL:  Positive bowel sounds x 4. There is no guarding. No rebound. No hepatomegaly noted.  GENITOURINARY:  Deferred.  MUSCULOSKELETAL:  No erythema or  swelling noted. Full range of motion intact. SKIN:  Shows well-hydrated. No rash or lesions noted.  LYMPHATICS:  No cervical lymph node enlargement. No other lymphadenopathy noted.  VASCULAR:  Good DP, PT pulses.  NEUROLOGICAL:  Cranial nerves II to XII grossly intact. Strength 5/5 in all 4 extremities.  PSYCHIATRIC:  The patient is awake, alert, oriented x 3. Not suicidal or homicidal.  LABORATORY, DIAGNOSTIC, AND RADIOLOGICAL DATA:  Glucose 84, BUN 7, creatinine 0.69, sodium 140, potassium 3.8, chloride 109, CO2 is 25, calcium 8.5. LFTs:  Total protein 7.5, albumin 4.5, bili total 1.3, alk phos is 9.3, AST is 26, ALT 24. WBC 9.7, hemoglobin 8.7, platelet count 351.   ASSESSMENT AND PLAN:  The patient is a 29 year old with a history of sickle cell disease, who presents with back pain, leg pain, low-grade fever.  1.  Leg pain, back pain likely due to sickle cell crisis-related pain. At this time, we will treat patient with aggressive IV fluids. His hemoglobin is currently stable. There is no indication for transfusion. We will continue folic acid, hydroxyurea and multivitamins.  2.  Low-grade temperature. We will get a urinalysis, chest x-ray and blood cultures. We will hold antibiotics for now. 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  3. Miscellaneous. We will place him on Lovenox for deep vein thrombosis prophylaxis.   TIME SPENT:  45 minutes spent.    ____________________________ Lafonda Mosses. Posey Pronto, MD shp:jm D: 01/21/2013 16:36:53 ET T: 01/21/2013 18:14:18 ET JOB#: [id number]  cc: Anushri Casalino H. Posey Pronto, MD, <Dictator> Alric Seton MD ELECTRONICALLY SIGNED 01/27/2013 14:39

## 2015-01-04 NOTE — Consult Note (Signed)
History of Present Illness:   Reason for Consult Sickle cell crisis.    HPI   Patient is a 29 year old male admitted to the hospital complaining of significant back and left leg pain secondary to a sickle cell crisis.  Patient states his last crisis was approximately 3 weeks ago.  This is a typical crisis for him, which lasts 3-4 days.  He feels this crisis which triggered by the cold weather.  He states his pain has improved since admission.  He otherwise feels well.  He denies any chest pain or shortness of breath.  He has no recent fevers or illnesses.  He has a good appetite and denies any nausea, vomiting, constipation, or diarrhea.  He has no urinary complaints.  Patient otherwise feels well and offers no further specific complaints.  PFSH:   Additional Past Medical and Surgical History Past medical history: Sickle cell disease.  Past surgical history: Negative.  Family history: Negative and noncontributory.  Social history: Patient denies tobacco or alcohol.   Review of Systems:   Performance Status (ECOG) 0    Review of Systems   As per HPI. Otherwise, 10 point system review was negative.   NURSING NOTES: **Vital Signs.:   19-Jan-14 10:24    Temperature Temperature (F): 98.3    Celsius: 36.8    Temperature Source: oral    Pulse Pulse: 93    Respirations Respirations: 18    Systolic BP Systolic BP: 734    Diastolic BP (mmHg) Diastolic BP (mmHg): 74    Mean BP: 86    Pulse Ox % Pulse Ox %: 97    Pulse Ox Activity Level: At rest    Oxygen Delivery: 2L   Physical Exam:   Physical Exam General: Well-developed, well-nourished, no acute distress. Eyes: Pink conjunctiva, anicteric sclera. HEENT: Normocephalic, moist mucous membranes, clear oropharnyx. Lungs: Clear to auscultation bilaterally. Heart: Regular rate and rhythm. No rubs, murmurs, or gallops. Abdomen: Soft, nontender, nondistended. No organomegaly noted, normoactive bowel sounds. Musculoskeletal:  No edema, cyanosis, or clubbing. Neuro: Alert, answering all questions appropriately. Cranial nerves grossly intact. Skin: No rashes or petechiae noted. Psych: Normal affect. Lymphatics: No cervical, calvicular, axillary or inguinal LAD.    No Known Allergies:     Dilaudid 4 mg oral tablet: 1 tab(s) orally every 4 hours, As Needed- for Pain , Active, 30, None   hydroxyurea 500 mg oral capsule: 4 cap(s) orally once a day, Active, 0, None   folic acid 1 mg oral tablet: 1 tab(s) orally once a day, Active, 0, None   Benadryl 25 mg oral capsule: 1 cap(s) orally , As Needed- for Inability to Sleep , Active, 0, None   promethazine 25 mg oral tablet: 1 tab(s) orally , As Needed- for Nausea, Vomiting , Active, 0, None  Laboratory Results: Routine Chem:  19-Jan-14 06:20    Result Comment LABS - This specimen was collected through an   - indwelling catheter or arterial line.  - A minimum of 39ms of blood was wasted prior    - to collecting the sample.  Interpret  - results with caution. smear - SLIDE PREVIOUSLY REVIEWED BY PATHOLOGIST  Result(s) reported on 02 Oct 2012 at 09:05AM.   Glucose, Serum  105   BUN 11   Creatinine (comp)  0.51   Sodium, Serum 137   Potassium, Serum 3.8   Chloride, Serum 104   CO2, Serum 24   Calcium (Total), Serum  8.4   Anion Gap 9  Osmolality (calc) 274   eGFR (African American) >60   eGFR (Non-African American) >60 (eGFR values <19m/min/1.73 m2 may be an indication of chronic kidney disease (CKD). Calculated eGFR is useful in patients with stable renal function. The eGFR calculation will not be reliable in acutely ill patients when serum creatinine is changing rapidly. It is not useful in  patients on dialysis. The eGFR calculation may not be applicable to patients at the low and high extremes of body sizes, pregnant women, and vegetarians.)  Routine Hem:  19-Jan-14 06:20    WBC (CBC) 6.2   RBC (CBC)  2.38   Hemoglobin (CBC)  8.7   Hematocrit  (CBC)  25.5   Platelet Count (CBC) 264   MCV  107   MCH  36.3   MCHC 34.0   RDW  19.5   Bands 1   Segmented Neutrophils 41   Lymphocytes 46   Monocytes 10   Basophil 2   NRBC 30   Diff Comment 1 ANISOCYTOSIS   Diff Comment 2 POIKILOCYTOSIS   Diff Comment 3 POLYCHROMASIA   Diff Comment 4 MACROCYTES PRESENT   Diff Comment 5 ELLIPTOCYTES   Diff Comment 6 HOWELL JOLLY BODIES   Diff Comment 7 TARGET CELLS   Diff Comment 8 SICKLE CELLS   Diff Comment 9 PLTS VARIED IN SIZE  Result(s) reported on 02 Oct 2012 at 09:05AM.   Assessment and Plan:  Impression:   Sickle cell crisis.  Plan:   1.  Sickle cell crisis: Patient's hemoglobin is stable, but it is unclear of patient's baseline.  His reticulocyte count is appropriately elevated.  His pain is improving, therefore would recommend continuing current pain management strategy.  Continue hydroxyurea and folic acid.  No further intervention is needed at this time.  Please [facility], will follow.  Electronic Signatures: FDelight Hoh(MD)  (Signed 19-Jan-14 11:11)  Authored: HISTORY OF PRESENT ILLNESS, PFSH, ROS, NURSING NOTES, PE, ALLERGIES, HOME MEDICATIONS, LABS, ASSESSMENT AND PLAN   Last Updated: 19-Jan-14 11:11 by FDelight Hoh(MD)

## 2015-01-04 NOTE — H&P (Signed)
PATIENT NAME:  Dennis Bernard, Dennis Bernard MR#:  [id number] DATE OF BIRTH:  [birth year]  DATE OF ADMISSION:  09/18/2012  PRIMARY CARE PHYSICIAN: Dr. Marlou Sa at [facility]: Low back pain and left leg pain for 2 days.   HISTORY OF PRESENT ILLNESS: This is a 29 year old male with sickle cell disease. He has been having low back pain and left leg pain for about 2 days. Pain was 10 out of 10 in intensity, now 9 out of 10 in intensity. His oral Dilaudid was not helping at home. He is also feeling some chest tightness, worse when he lies down. He states that he just is over a bad cold and may be dehydrated. They gave him an inhaler in the ER that helped a little bit. Normally, he states that he has a sickle cell crisis once per month but is admitted once every 3 months. We do not have any records of him here at this facility. In the ER, he already received 10 mg of Dilaudid without much relief, and hospitalist services were contacted for further evaluation.   PAST MEDICAL HISTORY: Sickle cell disease, anemia.   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   ALLERGIES: No known drug allergies.   MEDICATIONS: Benadryl 25 mg at bedtime, Dilaudid 4 mg every 8 hours as needed for pain, folic acid 1 mg a day, hydroxyurea 1000 mg twice a day, Promethazine 25 mg p.r.n. nausea and vomiting.   SOCIAL HISTORY: No smoking. No alcohol. No drug use. He lives with friends. 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   FAMILY HISTORY: Father with sickle cell trait. Mother with sickle cell trait and diabetes. Brother with sickle cell disease.   REVIEW OF SYSTEMS:  CONSTITUTIONAL: Positive for fever 100.1 at home, chills and night sweats. Decreased appetite, no weight gain, no weight loss. Positive for fatigue.  EYES: No blurry vision.  EARS, NOSE, MOUTH, AND THROAT: No sore throat. No difficulty swallowing. Positive for runny nose.  CARDIOVASCULAR: Positive for chest  tightness.  RESPIRATORY: Positive for shortness of breath, cough dry. No hemoptysis.  GASTROINTESTINAL: Positive for nausea. No vomiting. No abdominal pain. No diarrhea. No constipation. No bright red blood per rectum. No melena.  GENITOURINARY: No burning on urination, no hematuria.  MUSCULOSKELETAL: Positive for low back pain and left leg lateral pain.  INTEGUMENT: No rashes or eruptions.  NEUROLOGIC: No fainting or blackouts.  PSYCHIATRIC: No anxiety or depression.  ENDOCRINE: No thyroid problems.  HEMATOLOGIC/LYMPHATIC: Positive for anemia.   PHYSICAL EXAMINATION:  VITAL SIGNS: On presentation, temperature was 98.6, pulse 121, respirations 20, blood pressure 119/79, pulse oximetry 100% on room air. Current blood pressure 99/51.  GENERAL: No respiratory distress.  HEENT: Eyes: Conjunctivae and lids normal. Pupils are equal, round, and reactive to light. Extraocular muscles are intact. No nystagmus. Ears, nose, mouth, and throat: Tympanic membrane: Positive for erythema. Nasal mucosa: No erythema. Throat: Slight erythema, no exudate seen. Lips and gums: No lesions.  NECK: No JVD. No bruits. No lymphadenopathy. No thyromegaly. No thyroid nodules palpated.  RESPIRATORY: Coarse breath sounds bilaterally. No rhonchi, rales, or wheeze heard.  CARDIOVASCULAR: S1, S2 normal. A 2/6 systolic ejection murmur. No gallops or rubs heard. Carotid upstroke 2+ bilaterally. No bruits.  EXTREMITIES: Dorsalis pedis pulses 2+ bilaterally.  ABDOMEN: Soft, nontender. No organomegaly/splenomegaly. Normoactive bowel sounds. No masses felt.  LYMPHATIC: No lymph nodes in the neck.  MUSCULOSKELETAL: No clubbing, edema, or cyanosis.  SKIN: No rashes or ulcers seen.  NEUROLOGICAL: Cranial nerves II through XII grossly intact. Deep tendon reflexes 1+ bilateral lower extremities.  PSYCHIATRIC: The patient is oriented to person, place, and time.    LABORATORY, DIAGNOSTIC AND RADIOLOGICAL DATA:  Influenza A and B  negative. White blood cell count 8.3, hemoglobin and hematocrit 9.5 and 27.8, platelet count of 574. Reticulocyte count of 7.16, glucose 92, BUN 11, creatinine 0.63, sodium 139, potassium 3.4, chloride 107, CO2 24, calcium 9.2. On the diff shows anisocytosis, poikilocytosis, polychromasia, microcytes, platelets varied in size, target cells, elliptocytes, hypersegmented neutrophils.   Chest x-ray showed shallow inspiration without evidence of acute cardiopulmonary disease. EKG is ordered by me.  ASSESSMENT AND PLAN: 1. Sickle cell crisis with tachycardia: Watch closely for acute chest syndrome with the patient complaining of chest tightness. We will give vigorous IV fluid hydration. We will order 0.25 liter wide open at 150 mL/h afterwards. The patient is on usual hydroxyurea and folic acid. Pain control with IV Dilaudid 2 mg IV q. 2 hours p.r.n. pain, Benadryl and Promethazine p.r.n.  2. Chest pain: This is probable bronchitis. He has been having a cold for a couple of weeks and has not got over it. Chest x-ray is negative. We will give IV fluid hydration. We will give empiric Rocephin and Zithromax and blood cultures x2.  3. Hypotension and tachycardia: I will get blood cultures x2. Rule out sepsis. The patient does have a port. I will give empiric Rocephin and Zithromax for right now. I will obtain a sputum culture also.  4. Hypokalemia: We will replace potassium orally and recheck again in a.m. along with the magnesium.   TIME SPENT ON ADMISSION:  55 minutes.   ____________________________ Tana Conch. Leslye Peer, MD rjw:cb D: 09/18/2012 17:53:47 ET T: 09/18/2012 18:40:43 ET JOB#: [id number]  cc: Tana Conch. Leslye Peer, MD, <Dictator> Dr. Marlou Sa at [facility] MD ELECTRONICALLY SIGNED 09/29/2012 19:56

## 2015-01-04 NOTE — Discharge Summary (Signed)
PATIENT NAME:  Dennis Bernard, Dennis Bernard MR#:  [id number] DATE OF BIRTH:  1986/01/14  DATE OF ADMISSION:  10/19/2012  DATE OF DISCHARGE:   10/20/2012  DISCHARGE DIAGNOSES: 1.  Sickle cell crisis, now resolved, close to baseline.  2.  Chronic anemia, likely from sickle cell disease, stable.   SECONDARY DIAGNOSIS:  History of sickle cell disease.   CONSULTATION:  none  PROCEDURES/RADIOLOGY:  Chest x-ray on admission, on 3rd of February, showed no evidence of acute cardiopulmonary disease.   HISTORY AND SHORT HOSPITAL COURSE:  The patient is a 29 year old male with known history of sickle cell disease, who was admitted for sickle cell crisis, was started on IV Dilaudid. Please see Dr. Chana Bode Patel's dictated history and physical for further details. He was slowly improving. Within 2 to 3 days, his pain was much controlled and was close to his baseline. His vitals remained stable, and he was hemodynamically also stable. He is being discharged back home in stable condition. He had requested a new primary care physician, which he has got an appointment with Dr. Elijio Miles,  where he will be following next week.   DISCHARGE VITAL SIGNS: On the date of discharge, his temperature is 98.7, heart rate 62 per minute, respirations 16 per minute, blood pressure 114/72 mmHg.  He was saturating 98% on room air.   PERTINENT PHYSICAL EXAMINATION:  On the date of discharge:  CARDIOVASCULAR:  S1, S2 normal. No murmurs, rubs or gallop.  LUNGS:  Clear to auscultation bilaterally. No wheezes, rales, rhonchi or crepitation.  ABDOMEN:  Soft, benign.  NEUROLOGIC:  Nonfocal examination. All other physical examination remained at baseline.   DISCHARGE MEDICATIONS: 1.  Hydroxyurea 500 mg 4 capsules p.o. daily.  2.  Benadryl 25 mg p.o. daily as needed.  3.  Cyanocobalamin 1000 mcg p.o. daily.  4.  Folic acid 1 mg p.o. daily.  5.  Thiamine 100 mg p.o. daily.  6.  Dilaudid 4 mg p.o. 3 times a day as needed, total of 18  tablet prescription has been given without refill.   DISCHARGE DIET:  Regular.   DISCHARGE ACTIVITY:  As tolerated.   DISCHARGE INSTRUCTIONS AND FOLLOWUP:  The patient was instructed to follow up with his new primary care physician, Dr. Elijio Miles, on coming Wednesday at 2:15 p.m. on 12th of February, as the patient requested to change his PCP.   TOTAL TIME DISCHARGING THIS PATIENT: 55 minutes.    ____________________________ Lucina Mellow. Manuella Ghazi, MD vss:dm D: 10/20/2012 12:57:00 ET T: 10/20/2012 13:27:42 ET JOB#: [id number]  cc: Emmory Solivan S. Manuella Ghazi, MD, <Dictator> Dr. Arnette Norris [facility] MD ELECTRONICALLY SIGNED 10/21/2012 17:13

## 2015-01-04 NOTE — Discharge Summary (Signed)
PATIENT NAME:  Dennis Bernard, Dennis Bernard MR#:  [id number] DATE OF BIRTH:  1986/06/03  DATE OF ADMISSION:  09/18/2012 DATE OF DISCHARGE:  09/20/2012  PRIMARY CARE PHYSICIAN:  Dr. Marlou Sa in [place].   DISCHARGE DIAGNOSES:   1.  Sickle cell pain crisis.  2.  Sickle cell anemia with hemolytic crisis.  3.  Acute bronchitis.   IMAGING STUDIES DONE:  Include a chest x-ray showed no acute abnormalities.   ADMITTING HISTORY AND PHYSICAL:  Please see detailed history and physical dictated on 09/18/2012. In brief, a 29 year old male patient with history of sickle cell disease who presented to the hospital with low back pain and left leg pain. He was found to be in hemolytic crisis along with pain crisis and was admitted to the hospitalist service for IV pain medication.   HOSPITAL COURSE:  The patient was on IV Dilaudid and later transitioned onto p.o. Dilaudid with pain much well controlled on oral medications on the day of discharge. His back pain is resolved. He still has some lower extremity pain which is improving. His anemia shows hemoglobin of 7.4. His baseline is around 8 and the patient is being discharged home to follow up with Dr. Marlou Sa in [place] at the hematologic clinic. On the day of discharge, the patient is afebrile, blood pressure 123/80 and saturating 100% on room air. The patient did have some wheezing at the time of admission and is on treatment for bronchitis and is improving.   DISCHARGE MEDICATIONS:  Include:  1.  Dilaudid 4 mg oral every 4 hours as needed for pain.  2.  Hydroxyurea 5 mg 4 capsules oral once a day.  3.  Folic acid 1 mg oral once a day.  4.  Benadryl 25 mg oral as needed at bedtime.  5.  Promethazine 25 mg oral every 6 hours as needed for nausea and vomiting.  6.  Azithromycin 500 mg oral once a day for 3 days.   DISCHARGE INSTRUCTIONS:  Follow up with Dr. Marlou Sa in [place], regular diet, activity as tolerated.   TIME SPENT:  On the day of discharge in discharge  activity was 35 minutes.    ____________________________ Leia Alf Lealand Elting, MD srs:si D: 09/20/2012 14:20:00 ET T: 09/20/2012 20:11:49 ET JOB#: [id number]  cc: Alveta Heimlich R. Kirklin Mcduffee, MD, <Dictator> Dr. Marlou Sa in [place] SIGNED 09/22/2012 15:26

## 2015-01-04 NOTE — H&P (Signed)
PATIENT NAME:  Dennis Bernard, Dennis Bernard MR#:  [id number] DATE OF BIRTH:  1986-07-15  DATE OF ADMISSION:  02/11/2013  PRIMARY CARE PHYSICIAN:  Dr. Elijio Miles.   CHIEF COMPLAINT:  Typical sickle cell crisis.   HISTORY OF PRESENT ILLNESS:  A 29 year old male with a history of sickle cell disease who was just discharged actually on 01/24/2013 with sickle cell crisis who comes in with the typical sickle cell crisis with pain in his back, legs and low-grade fever.  The patient has been hospitalized several times within this year for sickle cell crisis.  He says his pain is uncontrollable.  He is taking 4 mg of Dilaudid every 4 to 6 hours.  His temperature was 100.2 at home.  It appears that during his last hospitalization he was complaining of the same thing, pain in his back, legs and low-grade fever.  At that time his blood cultures just showed a contaminant.   REVIEW OF SYSTEMS:  CONSTITUTIONAL:  Positive elevated fever, fatigue and weakness.  EYES:  No blurred or double vision.  EARS, NOSE, THROAT:  No ear pain, hearing loss, seasonal allergies, snoring.  RESPIRATORY:  No cough, wheezing, hemoptysis, dyspnea.  CARDIOVASCULAR:  No chest pain, orthopnea, edema, palpitations, syncope.  GASTROINTESTINAL:  No nausea, vomiting, diarrhea, abdominal pain, melena or ulcers.  GENITOURINARY:  No dysuria, hematuria.  ENDOCRINE:  No polyuria or polydipsia.   HEMATOLOGY AND LYMPHATIC:  Positive sickle cell anemia.  SKIN:  No rash or lesions.  MUSCULOSKELETAL:  He has the typical crisis in his lower back and left leg.  NEUROLOGIC:  No history of CVA, TIA, seizures.  PSYCHIATRIC:  No history of anxiety or depression.    PAST MEDICAL HISTORY:  Sick cell.  PAST SURGICAL HISTORY:  Port-A-Cath placement and cholecystectomy.   MEDICATIONS:   1.  Dilaudid 4 mg every 4 to 6 hours.  2.  Hydroxyurea 500 mg 4 tablets daily.  3.  Folic acid 1 mg daily.  4.  Fentanyl 50 mcg patch q. 48 hours.   ALLERGIES:  No known drug  allergies.   SOCIAL HISTORY:  No tobacco, alcohol or drug use.   FAMILY HISTORY:  Positive for sickle cell trait.  His brother has sickle cell disease.   PHYSICAL EXAMINATION:   VITAL SIGNS:  Temperature 98.5, pulse is 99, respirations 20, blood pressure 115/61, 97% on room air.  GENERAL:  The patient is alert and oriented, not in acute distress.  HEENT:  Head is atraumatic.  Pupils are round.  Sclerae anicteric.  Mucous membranes are moist.  Oropharynx is clear.  NECK:  Supple without JVD, carotid bruit or enlarged thyroid.  CARDIOVASCULAR:  Regular rate and rhythm.  No murmurs, gallops, rubs.  PMI is not displaced.  LUNGS:  Clear to auscultation without crackles, rales, rhonchi or wheezing.  Normal percussion.  ABDOMEN:  Bowel sounds are positive.  Nontender, nondistended.  No hepatosplenomegaly.  No guarding, rebound, rigidity or ecchymosis.  MUSCULOSKELETAL:  No pathology to digits or nails.  His extremities move x 4.  No tenderness or effusion.  SKIN:  Without rash or lesions, well-hydrated.  NEUROLOGIC:  Cranial nerves II through XII are grossly intact.  No focal deficits.  Motor strength 4 out of 4 bilateral upper and lower extremities.  BACK:  No CVA or vertebral tenderness.   LABORATORY, DIAGNOSTIC AND RADIOLOGIC DATA:  Urinalysis shows no leukocyte esterase or nitrites.  Sodium 137, potassium 3.7, chloride 107, bicarbonate 24, BUN 13, creatinine 0.74, glucose 106, calcium 9.3, bilirubin 2.0,  alkaline phosphatase 110, ALT 26, AST 38, total protein 8.6, albumin 4.8, white blood cells 9.6, hemoglobin 10, hematocrit 29.2, platelets are 421, retic count is 5.97.  Chest x-ray shows no acute cardiopulmonary disease.   ASSESSMENT AND PLAN:  A 29 year old male who presents with sickle cell crisis which is typical for him.  1.  Typical sickle cell crisis.  The patient will be admitted to general medicine floor.  We will continue with PCA Dilaudid pump, IV fluids, Colace and senna to prevent  from ileus and continue to monitor his CBC.  2.  Low-grade fever of unclear etiology.  His chest x-ray and urinalysis are negative.  We will just continue to monitor.   CODE STATUS:  The patient is FULL CODE status.   TIME SPENT:  Approximately 40 minutes.    ____________________________ Donell Beers. Benjie Karvonen, MD spm:ea D: 02/11/2013 22:29:29 ET T: 02/11/2013 23:39:15 ET JOB#: [id number]  cc: Tyshan Enderle P. Benjie Karvonen, MD, <Dictator> Venetia Maxon. Elijio Miles, MD Donell Beers Thierno Hun MD ELECTRONICALLY SIGNED 02/12/2013 15:27

## 2015-01-04 NOTE — H&P (Signed)
PATIENT NAME:  Dennis Bernard, Dennis Bernard MR#:  [id number] DATE OF BIRTH:  04-27-1986  DATE OF ADMISSION:  10/01/2012    REFERRING PHYSICIAN:  Dr. Owens Shark.  CHIEF COMPLAINT:  Left leg pain and low back pain for two days.   HISTORY OF PRESENT ILLNESS:  The patient is a 29 year old African American male with a history of sickle cell disease and chronic anemia, is presenting to the ER with a chief complaint of left leg pain and low back pain for two days.  The patient is reporting that his pain is aching and throbbing, 10/10 in nature.  He tried p.o. Dilaudid 4 mg, which is his home medication at home, with no significant improvement.  As the pain is getting worse and the patient was unable to function properly he came into the ER.  In the ER, patient's absolute retic count is elevated at 0.2398 and retic count is elevated at 9.85.  The patient was given Dilaudid 2 mg IV x 3 and hospitalist team is called to admit the patient for sickle cell crisis.  During my examination, patient is still complaining of severe pain in the left leg and lower back and is requesting for pain medication.  Denies any fever at home.  Denies any chest pain, chest tightness or shortness of breath or abdominal pain.  Denies any nausea, vomiting.  No family members are at bedside.   PAST MEDICAL HISTORY:  Sickle cell disease and anemia from sickle cell disease.   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   ALLERGIES:  No known drug allergies.   HOME MEDICATIONS:  Promethazine 25 mg as needed for nausea and vomiting, hydroxyurea 500 mg 4 capsules once a day, folic acid 1 mg once a day, Dilaudid 4 mg p.o. every 4 hours, Benadryl 25 mg as needed for itching.   PSYCHOSOCIAL HISTORY:  Lives at home with friends.  Denies smoking, alcohol or illicit drug usage.   FAMILY HISTORY:  Both Mother and Father have sickle cell trait.  Brother with sickle cell disease.   REVIEW OF SYSTEMS:   CONSTITUTIONAL:  No fever.  Complaining of fatigue and weakness.  Complaining of left leg pain and low back pain.  Denies any weight loss or weight gain.  EYES:  Denies blurry vision, inflammation, glaucoma.  EARS, NOSE, THROAT:  Denies any ear pain, snoring, postnasal drip, sinus pain, difficulty in swallowing.  RESPIRATORY:  No cough, wheezing, COPD, TB, pneumonia.  CARDIOVASCULAR:  Denies any chest pain, orthopnea, syncope, high blood pressure.  GASTROINTESTINAL:  No nausea, vomiting, diarrhea, abdominal pain.  GENITOURINARY:  Denies dysuria, hematuria.  ENDOCRINOLOGY:  Denies polyuria, polyphagia, polydipsia, thyroid problems.  HEMATOLOGIC AND LYMPHATIC:  Chronic history of anemia is present.  Denies any bleeding or swollen glands.  MUSCULOSKELETAL:  Complaining of pain in the left leg and lower back.  Denies any gout, swelling.  NEUROLOGIC:  Denies any numbness, weakness, ataxia, dementia.  PSYCHIATRIC:  Denies insomnia, ADD, OCD, bipolar disorder.  INTEGUMENTARY:  Denies any acne, rash and lesions.   PHYSICAL EXAMINATION: VITAL SIGNS:  Temperature 99.1, pulse 72, respirations 18, blood pressure is 112/64, pulse ox 100% on 2 liters.  GENERAL APPEARANCE:  Not under acute distress, moderately built and moderately nourished.  HEENT:  Normocephalic, atraumatic.  Pupils are equally reacting to light and accommodation.  No conjunctival injection.  No scleral icterus.  No nasal congestion.  No sinus tenderness.  No postnasal drip.  NECK:  Supple.  No  JVD.  No thyromegaly.  LUNGS:  Clear to auscultation bilaterally.  No accessory muscle usage.  No anterior chest wall tenderness on palpation.  CARDIAC:  S1, S2 normal.  Regular rate and rhythm.  Positive ESM, ejection systolic murmur.  GASTROINTESTINAL:  Soft.  Bowel sounds are positive in all four quadrants.  Nontender, nondistended.  No masses felt.  NEUROLOGIC:  Awake, alert, oriented x 3.  Motor and sensory are grossly intact.  Reflexes are  2+.  EXTREMITIES:  No edema.  No cyanosis.  No clubbing.  MUSCULOSKELETAL:  No joint effusion, erythema or tenderness.  BACK:  Diffuse low back tenderness is present, but no vertebral or paravertebral tenderness is present.   LABORATORY AND IMAGING STUDIES:  Glucose 114, BUN 11, creatinine 0.64, sodium 138, potassium 3.7, chloride 106, anion gap 7, GFR greater than 60, serum osmolality 276, calcium 8.8, WBC 10.4, hemoglobin 8.7, hematocrit 26.0, platelet count is 253, MCV 107, retic count is 9.85, absolute retic count 0.2398.  Chest x-ray, PA and lateral view, mild basilar opacities are likely secondary to atelectasis given the low lung lines.  Follow up with PA and lateral chest radiograph can be performed if symptoms persist.   ASSESSMENT AND PLAN:  The patient is a 29 year old African American male presenting to the ER with a chief complaint of left leg pain and low back pain for two days with no significant improvement with by mouth Dilaudid, will be admitted with the following assessment and plan.  1.  Sickle cell crisis.  We will admit the patient to tele bed.  We will give him regular diet and provide IV fluids at D5 half-normal saline at 100 per hour.  And Dilaudid 2 mg IV q. 4 hours as needed for pain.  We will continue his home medication hydroxyurea and folic acid.  Physical therapy consult is placed regarding left leg pain and low back pain.  Case management consult is placed regarding discharge planning.  2.  Anemia of chronic disease, probably from sickle cell disease.  We will continue folic acid and monitor hemoglobin and hematocrit closely.  3.  Low back pain, probably from sickle cell crisis.  Pain management and a PT consult.  4.  GI prophylaxis with Protonix and deep vein thrombosis prophylaxis is not needed as the patient is ambulatory.   5.  CODE STATUS:  THE PATIENT IS FULL CODE.   The diagnosis and plan of care was discussed in detail with the patient.  He is aware of the plan.    TOTAL TIME SPENT ON ADMISSION:  Is 45 minutes.    ____________________________ [name], MD ag:ea D: 10/01/2012 05:12:00 ET T: 10/01/2012 23:02:37 ET JOB#: [id number]  cc: [name], MD, <Dictator> [name] MD ELECTRONICALLY SIGNED 10/15/2012 5:36

## 2015-01-04 NOTE — Discharge Summary (Signed)
PATIENT NAME:  Dennis Bernard, KNOBLE MR#:  [id number] DATE OF BIRTH:  [birth year]  DATE OF ADMISSION:  02/11/2013 DATE OF DISCHARGE:    DISCHARGE DIAGNOSES: Sickle cell crisis.   SECONDARY DIAGNOSIS: History of sickle cell disease.   DISCHARGE MEDICATIONS:  1.  Hydromorphone 4 mg every 3 hours as needed x 5 days.  2.  Following that, the patient is to resume his home regimen of Dilaudid 4 mg twice a day. 3.  Fentanyl patch 50 mcg per hour, change every 48 hours.  4.  Hydroxyurea 5 mg 4 caps daily.  5.  Folic acid 1 mg daily.  6.  Benadryl 25 mg daily p.r.n.   HOSPITAL COURSE: This patient was admitted through the Emergency Room complaining of exacerbation of his back pain and left leg pain, which was attributed to sickle cell crisis. His reticulocyte count was elevated. There was no evidence of an infection. He was admitted to the medical floor by Dr. Bettey Costa. The patient received intravenous fluids, intravenous analgesia in the form of  PCA, which was adjusted by me until he achieved adequate analgesia. By the second day of admission, his pain was at a level of 4 out of 10. His white cell count was normal. Hemoglobin remained above 8 and clinical workup was negative for infection. The patient is being discharged to home on oral Dilaudid as detailed above in a satisfactory condition.   DISCHARGE INSTRUCTIONS:   DIET: Regular.   ACTIVITY: As tolerated.   FOLLOWUP: Follow up in 1 week with Dr. Elijio Miles.   DISCHARGE PROCESS TIME SPENT: 32 minutes.   ____________________________ Venetia Maxon Elijio Miles, MD sat:aw D: 02/13/2013 13:14:40 ET T: 02/13/2013 13:59:24 ET JOB#: [id number]  cc: Alfredia Ferguson A. Elijio Miles, MD, <Dictator> Veverly Fells MD ELECTRONICALLY SIGNED 02/16/2013 9:18

## 2015-01-04 NOTE — Discharge Summary (Signed)
PATIENT NAME:  Dennis Bernard, PLESSINGER MR#:  [id number] DATE OF BIRTH:  Nov 24, 1985  DATE OF ADMISSION:  01/21/2013 DATE OF DISCHARGE:  01/25/2013   ADMITTING PHYSICIAN:  Dustin Flock, MD  PRIMARY CARE PHYSICIAN: Pernell Dupre, MD  DISCHARGE DIAGNOSES:  Sickle cell crisis.   DISCHARGE MEDICATIONS:  Dilaudid 4 mg p.r.n. every 3 hours. Fentanyl patch 50 mcg every 48 hours.  Refer to discharge medical reconciliation for other home medications that were resumed.  HOSPITAL COURSE: This lady was admitted through the Emergency Room complaining of low-grade fever and pain in his back and lower legs. He had seen me in the office on Friday and reported to me that he was having a sickle cell crisis but had refused to seek medical care in the hospital due to the fact that he was currently taking his end of year final exams. The patient subsequently presented to the Emergency Room the following day with the above list of complaints. His initial work-up did not reveal any source of infection.  He was admitted to the hospital and placed on analgesia and intravenous fluids.   His white cell was slightly elevated around 12,000. His blood cultures did grow 1 out of 2 gram-positive cocci which I attributed to most likely being a contaminant.  I did start him on empiric vancomycin as well as well as I waited for repeat blood cultures that ended up being negative.  I discontinued his vancomycin without any evidence of fever and his white cell count normalized within 24 hours. The patient'Taleigh Gero pain was controlled on his current regimen. I have increased his Dilaudid to every 3 hours from his baseline of every 4 hours as needed at home and I will reassess his analgesia at his outpatient appointment in a week.   DIET: Regular.   FOLLOW UP:  Follow-up one week with Dr. Elijio Miles.  ACTIVITY: As tolerated.   DISCHARGE CONDITION: Satisfactory.   Discharge process time: 35 minutes.    ____________________________ Venetia Maxon Elijio Miles, MD sat:ct D: 01/25/2013 14:02:52 ET T: 01/25/2013 15:04:49 ET JOB#: [id number]  cc: Alfredia Ferguson A. Elijio Miles, MD, <Dictator> Veverly Fells MD ELECTRONICALLY SIGNED 01/29/2013 13:06

## 2015-01-04 NOTE — Discharge Summary (Signed)
PATIENT NAME:  Dennis Bernard, Dennis Bernard MR#:  [id number] DATE OF BIRTH:  [birth year]  DATE OF ADMISSION:  10/01/2012 DATE OF DISCHARGE:  10/03/2012  PRIMARY CARE PHYSICIAN:  Nonlocal.   DISCHARGE DIAGNOSIS:  Sickle cell pain crisis, chronic anemia.   HISTORY OF PRESENT ILLNESS:  The patient is a 29 year old African American male with history of sickle cell disease and chronic anemia presented to the Emergency Room with chief complaint of left leg pain and low back pain for two days.  The patient was reporting that the pain was an aching and throbbing, X/X in nature.  He tried oral Dilaudid 4 mg which was at-home medication.  No significant improvement and pain was getting worse, was unable to function properly and so he came to the Emergency Room.  Absolute retic count was elevated to 0.2398.  The patient was given Dilaudid 2 mg IV x 3 by ER physician without significant improvement and he was admitted for pain management.  He denied any fever, any chest pain, chest tightness, abdominal pain, nausea, vomiting.  His pain was managed with IV Dilaudid 4 mg every 3 to 4 hours and then he was started on long-acting OxyContin  for better control.  He was still asking for more pain medications and so for uncontrolled pain and his sickle cell crisis hematology/oncology consult was done and they suggested to continue on hydroxyurea and follow up with his primary care or primary oncologist after discharge.  On the 3rd day of the hospital stay, patient states his pain is relatively better controlled with medication and he should be able to manage it at home with oral medications so they decided to discharge him with prescriptions for pain medication.    Wheeler:  Chronic anemia, remained stable during the pain crisis in the hospital and did not require any further work-up for that.   LABORATORY RESULTS:  On admission, WBC were 10.4, hemoglobin 8.7, platelet count 253, MCV was 107, glucose 114,  BUN 11, creatinine 0.64, sodium 138 and potassium 3.7, chloride 106, CO2 25, platelet count 9.85.  Chest x-ray PA and lateral, mild basilar opacity, likely secondary to atelectasis.   CONSULTATIONS:  With Dr. Delight Hoh for hematology.   CONDITION ON DISCHARGE:  Stable.   CODE STATUS ON DISCHARGE:  FULL CODE.    MEDICATIONS ON DISCHARGE:  Dilaudid 4 mg oral tablet every 4 hours as needed for pain, folic acid 1 mg oral tablet once a day, promethazine 25 mg oral tablet 3 times a day as needed for nausea, hydroxyurea 500 mg oral capsules 4 capsules orally once a day for 15 days, Benadryl 25 mg oral capsule once a day for 15 days as needed, oxycodone 40 mg oral extended release capsule every 12 hours for 8 days, cyanocobalamin 1000 mcg oral tablet for 30 days and thiamine 100 mg oral tablet once a day for 30 days.   HOME HEALTH:  No.   HOME OXYGEN ON DISCHARGE:  No.   DIET ON DISCHARGE:  Regular.   CONSISTENCY:  Regular.   ACTIVITY LIMITATION:  As tolerated.   REFERRAL:  None.   TIMEFRAME TO FOLLOW-UP:  In 1 to 2 weeks.  Follow up with your hematologist in [place].  If for some reason unable to get an appointment, make appointment with Dr. Gary Fleet office.    TOTAL TIME SPENT IN THE DISCHARGE:  Forty-five minutes.     ____________________________ Ceasar Lund Anselm Jungling, MD vgv:ea D: 10/06/2012 93:81:01 ET T: 10/07/2012 00:30:43  ET JOB#: [id number]  cc: Ceasar Lund. Anselm Jungling, MD, <Dictator> Kathlene November. Grayland Ormond, MD Vaughan Basta MD ELECTRONICALLY SIGNED 11/04/2012 17:44

## 2015-01-04 NOTE — H&P (Signed)
PATIENT NAME:  Dennis Bernard, Dennis Bernard MR#:  [id number] DATE OF BIRTH:  November 11, 1985  DATE OF ADMISSION:  10/17/2012  PRIMARY CARE PHYSICIAN:  Dr. Marlou Sa   ED REFERRING PHYSICIAN: Lavonia Drafts, MD   CHIEF COMPLAINT: Pain in his chest, back, legs, nausea, vomiting, similar to his previous sickle cell episodes.   HISTORY OF PRESENT ILLNESS: The patient reports that he has been having symptoms for the past few days and has gotten worse. They are 10 out of 10 in nature. He was here recently hospitalized on January 18 with sickle cell crisis. At that time, he remained in the hospital for 3 days. He otherwise denies any fevers, chills. He reports that he has not been able to eat much due to his nausea. He denies any urinary frequency, urgency or hesitancy.   PAST MEDICAL HISTORY: Significant for sickle cell disease. He says last transfusion was 5 years ago.   PAST SURGICAL HISTORY:  1.  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  2.  Status post cholecystectomy.   ALLERGIES: None.   CURRENT MEDICATIONS: At home he is on Benadryl 25 daily as needed at bedtime, cyanocobalamin 100 mcg daily, Dilaudid 4 mg q.4 hours p.r.n. pain, folic acid 1 mg daily,  hydroxyurea 2000 mg daily, thiamine 100, 1 tab p.o. daily.   SOCIAL HISTORY: Denies any alcohol or drug use. He lives with a friend.   FAMILY HISTORY: Mother and father both had sickle cell trait. Brother with sickle cell disease.   REVIEW OF SYSTEMS:   CONSTITUTIONAL: Denies any fevers. Complains of fatigue, weakness. Complains of severe leg pain and back pain. He denies any weight loss or weight gain.  EYES: No blurred vision. No inflammation. No glaucoma.  EARS, NOSE, AND THROAT: Denies any ear pain, snoring. No postnasal drip. No sinus pain.  RESPIRATORY: No cough. No wheezing. No COPD, no asthma.   CARDIOVASCULAR: Complains of chest pain in the sternal region. Denies any orthopnea, syncope.  GASTROINTESTINAL: Complains of  nausea and vomiting. No abdominal pain.  GENITOURINARY: Denies any frequency, urgency, or hesitancy. No hematuria.  ENDOCRINE: Denies any polyuria, nocturia, polydipsia, or thyroid problems.  HEMATOLOGIC/LYMPHATIC: Has chronic history of anemia. Denies any bleeding or swollen glands.  MUSCULOSKELETAL: Complains of pain in the leg and back.  NEUROLOGIC: Denies any numbness, CVA, TIA. PSYCHIATRIC: Denies any insomnia, ADD or OCD.  SKIN: No acne. No rash. No changes in any lesions.   PHYSICAL EXAMINATION: VITAL SIGNS: Temperature 98.4, pulse 68, respiratory rate 20, blood pressure 108/65, O2 95% on room air.  GENERAL: The patient is a thin African American male currently not in any acute distress.  HEENT: Head atraumatic, normocephalic. Pupils equally round, reactive to light and accommodation. There is no conjunctival pallor. No scleral icterus. Nasal exam shows no drainage or ulceration. Oropharynx is clear. No JVD. No thyromegaly.  LUNGS: Clear to auscultation bilaterally without any rales, rhonchi, or wheezing.  CARDIOVASCULAR: S1, S2 positive. No murmurs, rubs, clicks, or gallops. PMI is not displaced.  ABDOMEN: Soft, nontender, nondistended. Positive bowel sounds x 4.  NEUROLOGIC: Awake, alert, oriented x 3. No focal deficits.  SKIN: No rash.  EXTREMITIES: No edema. No cyanosis. No clubbing.   LABORATORY EVALUATION: Glucose 107, BUN 13, creatinine 0.59, sodium 139, potassium 3.8, chloride 107, CO2 25, calcium 9.3, LDH 319. LFTs: Total protein 8.7, bilirubin   total 1.8. WBC 8.8, hemoglobin 9.6.  Of note, his hemoglobin on discharge was 8.7. His retic count is 4.8. His retic count  on January 17 was 9.85.   ASSESSMENT AND PLAN: The patient is a 29 year old with sickle cell disease who presents with recurrent pain possibly related to sickle cell crisis.  His laboratory evaluation does not show any evidence of significant hemolysis. Currently we will give him IV fluids, control his pain.  Continue hydroxyurea, continue folic acid and we will start him on multivitamins. We will repeat a CBC in the morning. The patient may benefit from going to a sickle cell clinic and possible pain management evaluation as well.    TIME SPENT: 35 minutes spent.     ____________________________ Lafonda Mosses. Posey Pronto, MD shp:cc D: 10/17/2012 20:24:25 ET T: 10/17/2012 23:59:03 ET JOB#: [id number]  cc: Letica Giaimo H. Posey Pronto, MD, <Dictator> Alric Seton MD ELECTRONICALLY SIGNED 10/22/2012 8:36
